# Patient Record
Sex: Female | Born: 1955 | Race: White | Hispanic: No | Marital: Married | Smoking: Former smoker
Health system: Southern US, Community
[De-identification: ages and names within clinical notes are randomized; demographics above are authoritative.]

## PROBLEM LIST (undated history)

## (undated) DIAGNOSIS — I509 Heart failure, unspecified: Secondary | ICD-10-CM

## (undated) DIAGNOSIS — M7500 Adhesive capsulitis of unspecified shoulder: Secondary | ICD-10-CM

## (undated) DIAGNOSIS — I472 Ventricular tachycardia, unspecified: Secondary | ICD-10-CM

## (undated) DIAGNOSIS — Z860101 Personal history of adenomatous and serrated colon polyps: Secondary | ICD-10-CM

## (undated) DIAGNOSIS — N289 Disorder of kidney and ureter, unspecified: Secondary | ICD-10-CM

## (undated) DIAGNOSIS — E559 Vitamin D deficiency, unspecified: Secondary | ICD-10-CM

## (undated) DIAGNOSIS — R7303 Prediabetes: Secondary | ICD-10-CM

## (undated) DIAGNOSIS — I82622 Acute embolism and thrombosis of deep veins of left upper extremity: Secondary | ICD-10-CM

## (undated) DIAGNOSIS — E785 Hyperlipidemia, unspecified: Secondary | ICD-10-CM

## (undated) DIAGNOSIS — I499 Cardiac arrhythmia, unspecified: Secondary | ICD-10-CM

## (undated) DIAGNOSIS — E039 Hypothyroidism, unspecified: Secondary | ICD-10-CM

## (undated) DIAGNOSIS — C801 Malignant (primary) neoplasm, unspecified: Secondary | ICD-10-CM

## (undated) DIAGNOSIS — M810 Age-related osteoporosis without current pathological fracture: Secondary | ICD-10-CM

## (undated) DIAGNOSIS — R002 Palpitations: Secondary | ICD-10-CM

## (undated) DIAGNOSIS — K635 Polyp of colon: Secondary | ICD-10-CM

## (undated) DIAGNOSIS — I420 Dilated cardiomyopathy: Secondary | ICD-10-CM

## (undated) DIAGNOSIS — K760 Fatty (change of) liver, not elsewhere classified: Secondary | ICD-10-CM

## (undated) DIAGNOSIS — E669 Obesity, unspecified: Secondary | ICD-10-CM

## (undated) DIAGNOSIS — T7840XA Allergy, unspecified, initial encounter: Secondary | ICD-10-CM

## (undated) HISTORY — PX: TRANSTHORACIC ECHOCARDIOGRAM: SHX275

## (undated) HISTORY — DX: Hyperlipidemia, unspecified: E78.5

## (undated) HISTORY — DX: Vitamin D deficiency, unspecified: E55.9

## (undated) HISTORY — DX: Allergy, unspecified, initial encounter: T78.40XA

## (undated) HISTORY — DX: Age-related osteoporosis without current pathological fracture: M81.0

## (undated) HISTORY — DX: Fatty (change of) liver, not elsewhere classified: K76.0

## (undated) HISTORY — DX: Disorder of kidney and ureter, unspecified: N28.9

## (undated) HISTORY — DX: Hypothyroidism, unspecified: E03.9

## (undated) HISTORY — DX: Dilated cardiomyopathy: I42.0

## (undated) HISTORY — PX: COLONOSCOPY: SHX174

## (undated) HISTORY — DX: Obesity, unspecified: E66.9

## (undated) HISTORY — PX: UPPER GASTROINTESTINAL ENDOSCOPY: SHX188

## (undated) HISTORY — DX: Polyp of colon: K63.5

## (undated) HISTORY — PX: LAPAROSCOPY: SHX197

## (undated) HISTORY — DX: Palpitations: R00.2

## (undated) HISTORY — DX: Personal history of adenomatous and serrated colon polyps: Z86.0101

## (undated) HISTORY — DX: Ventricular tachycardia, unspecified: I47.20

## (undated) HISTORY — DX: Acute embolism and thrombosis of deep veins of left upper extremity: I82.622

---
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
## 1995-01-08 HISTORY — PX: ABDOMINAL HYSTERECTOMY: SHX81

## 1997-09-02 ENCOUNTER — Other Ambulatory Visit: Admission: RE | Admit: 1997-09-02 | Discharge: 1997-09-02 | Payer: Self-pay | Admitting: Gynecology

## 1998-09-07 ENCOUNTER — Other Ambulatory Visit: Admission: RE | Admit: 1998-09-07 | Discharge: 1998-09-07 | Payer: Self-pay | Admitting: Gynecology

## 1999-09-11 ENCOUNTER — Other Ambulatory Visit: Admission: RE | Admit: 1999-09-11 | Discharge: 1999-09-11 | Payer: Self-pay | Admitting: Gynecology

## 2000-04-10 ENCOUNTER — Encounter: Admission: RE | Admit: 2000-04-10 | Discharge: 2000-04-10 | Payer: Self-pay | Admitting: Gynecology

## 2000-04-10 ENCOUNTER — Encounter: Payer: Self-pay | Admitting: Gynecology

## 2000-09-10 ENCOUNTER — Other Ambulatory Visit: Admission: RE | Admit: 2000-09-10 | Discharge: 2000-09-10 | Payer: Self-pay | Admitting: Gynecology

## 2001-10-02 ENCOUNTER — Other Ambulatory Visit: Admission: RE | Admit: 2001-10-02 | Discharge: 2001-10-02 | Payer: Self-pay | Admitting: Obstetrics and Gynecology

## 2001-11-04 ENCOUNTER — Encounter: Payer: Self-pay | Admitting: Obstetrics and Gynecology

## 2001-11-04 ENCOUNTER — Encounter: Admission: RE | Admit: 2001-11-04 | Discharge: 2001-11-04 | Payer: Self-pay | Admitting: Obstetrics and Gynecology

## 2003-09-27 ENCOUNTER — Other Ambulatory Visit: Admission: RE | Admit: 2003-09-27 | Discharge: 2003-09-27 | Payer: Self-pay | Admitting: Obstetrics and Gynecology

## 2003-10-04 ENCOUNTER — Ambulatory Visit (HOSPITAL_COMMUNITY): Admission: RE | Admit: 2003-10-04 | Discharge: 2003-10-04 | Payer: Self-pay | Admitting: Obstetrics and Gynecology

## 2004-08-01 ENCOUNTER — Encounter: Admission: RE | Admit: 2004-08-01 | Discharge: 2004-08-01 | Payer: Self-pay | Admitting: Family Medicine

## 2004-09-28 ENCOUNTER — Other Ambulatory Visit: Admission: RE | Admit: 2004-09-28 | Discharge: 2004-09-28 | Payer: Self-pay | Admitting: Obstetrics and Gynecology

## 2004-12-26 ENCOUNTER — Ambulatory Visit (HOSPITAL_COMMUNITY): Admission: RE | Admit: 2004-12-26 | Discharge: 2004-12-26 | Payer: Self-pay | Admitting: Obstetrics and Gynecology

## 2005-12-27 ENCOUNTER — Ambulatory Visit (HOSPITAL_COMMUNITY): Admission: RE | Admit: 2005-12-27 | Discharge: 2005-12-27 | Payer: Self-pay | Admitting: Emergency Medicine

## 2006-01-09 ENCOUNTER — Encounter: Admission: RE | Admit: 2006-01-09 | Discharge: 2006-01-09 | Payer: Self-pay | Admitting: Obstetrics and Gynecology

## 2006-08-18 ENCOUNTER — Encounter: Admission: RE | Admit: 2006-08-18 | Discharge: 2006-08-18 | Payer: Self-pay | Admitting: Obstetrics and Gynecology

## 2006-12-16 ENCOUNTER — Ambulatory Visit: Payer: Self-pay | Admitting: Internal Medicine

## 2006-12-29 ENCOUNTER — Ambulatory Visit: Payer: Self-pay | Admitting: Internal Medicine

## 2006-12-29 DIAGNOSIS — K635 Polyp of colon: Secondary | ICD-10-CM

## 2006-12-29 HISTORY — DX: Polyp of colon: K63.5

## 2006-12-29 HISTORY — PX: COLONOSCOPY W/ BIOPSIES: SHX1374

## 2006-12-30 ENCOUNTER — Encounter: Admission: RE | Admit: 2006-12-30 | Discharge: 2006-12-30 | Payer: Self-pay | Admitting: Obstetrics and Gynecology

## 2008-10-26 ENCOUNTER — Encounter: Admission: RE | Admit: 2008-10-26 | Discharge: 2008-10-26 | Payer: Self-pay | Admitting: Obstetrics and Gynecology

## 2009-12-08 ENCOUNTER — Encounter: Admission: RE | Admit: 2009-12-08 | Discharge: 2009-12-08 | Payer: Self-pay | Admitting: Obstetrics and Gynecology

## 2010-01-27 ENCOUNTER — Encounter: Payer: Self-pay | Admitting: Obstetrics and Gynecology

## 2010-12-25 ENCOUNTER — Other Ambulatory Visit: Payer: Self-pay | Admitting: Obstetrics and Gynecology

## 2010-12-25 DIAGNOSIS — Z1231 Encounter for screening mammogram for malignant neoplasm of breast: Secondary | ICD-10-CM

## 2011-01-08 LAB — HM MAMMOGRAPHY: HM Mammogram: NORMAL

## 2011-01-16 ENCOUNTER — Ambulatory Visit
Admission: RE | Admit: 2011-01-16 | Discharge: 2011-01-16 | Disposition: A | Discharge status: Home / Self Care | Payer: Self-pay | Source: Ambulatory Visit | Attending: Obstetrics and Gynecology | Admitting: Obstetrics and Gynecology

## 2011-01-16 DIAGNOSIS — Z1231 Encounter for screening mammogram for malignant neoplasm of breast: Secondary | ICD-10-CM

## 2011-07-26 LAB — FECAL OCCULT BLOOD, GUAIAC: Fecal Occult Blood: NEGATIVE

## 2011-09-18 ENCOUNTER — Other Ambulatory Visit (HOSPITAL_COMMUNITY): Payer: Self-pay | Admitting: Internal Medicine

## 2011-09-18 DIAGNOSIS — R1013 Epigastric pain: Secondary | ICD-10-CM

## 2011-09-18 DIAGNOSIS — R198 Other specified symptoms and signs involving the digestive system and abdomen: Secondary | ICD-10-CM

## 2011-09-23 ENCOUNTER — Ambulatory Visit (HOSPITAL_COMMUNITY)
Admission: RE | Admit: 2011-09-23 | Discharge: 2011-09-23 | Disposition: A | Discharge status: Home / Self Care | Payer: 59 | Source: Ambulatory Visit | Attending: Internal Medicine | Admitting: Internal Medicine

## 2011-09-23 DIAGNOSIS — R1013 Epigastric pain: Secondary | ICD-10-CM

## 2011-09-23 DIAGNOSIS — R198 Other specified symptoms and signs involving the digestive system and abdomen: Secondary | ICD-10-CM

## 2012-05-21 ENCOUNTER — Other Ambulatory Visit: Payer: Self-pay | Admitting: Nurse Practitioner

## 2012-05-26 ENCOUNTER — Other Ambulatory Visit: Payer: Self-pay

## 2012-05-26 DIAGNOSIS — Z1231 Encounter for screening mammogram for malignant neoplasm of breast: Secondary | ICD-10-CM

## 2012-06-10 ENCOUNTER — Other Ambulatory Visit (HOSPITAL_COMMUNITY): Payer: Self-pay | Admitting: Internal Medicine

## 2012-06-10 ENCOUNTER — Ambulatory Visit (HOSPITAL_COMMUNITY)
Admission: RE | Admit: 2012-06-10 | Discharge: 2012-06-10 | Disposition: A | Discharge status: Home / Self Care | Payer: 59 | Source: Ambulatory Visit | Attending: Internal Medicine | Admitting: Internal Medicine

## 2012-06-10 DIAGNOSIS — R059 Cough, unspecified: Secondary | ICD-10-CM | POA: Insufficient documentation

## 2012-06-10 DIAGNOSIS — R05 Cough: Secondary | ICD-10-CM | POA: Insufficient documentation

## 2012-06-23 LAB — FECAL OCCULT BLOOD, GUAIAC: Fecal Occult Blood: NEGATIVE

## 2012-07-03 ENCOUNTER — Other Ambulatory Visit (HOSPITAL_COMMUNITY): Payer: Self-pay | Admitting: Nurse Practitioner

## 2012-07-03 DIAGNOSIS — M810 Age-related osteoporosis without current pathological fracture: Secondary | ICD-10-CM

## 2012-07-23 ENCOUNTER — Other Ambulatory Visit: Payer: Self-pay | Admitting: *Deleted

## 2012-07-23 ENCOUNTER — Encounter: Payer: Self-pay | Admitting: Cardiovascular Disease

## 2012-07-23 ENCOUNTER — Ambulatory Visit (INDEPENDENT_AMBULATORY_CARE_PROVIDER_SITE_OTHER): Payer: 59 | Admitting: Cardiovascular Disease

## 2012-07-23 ENCOUNTER — Ambulatory Visit: Payer: 59

## 2012-07-23 ENCOUNTER — Ambulatory Visit
Admission: RE | Admit: 2012-07-23 | Discharge: 2012-07-23 | Disposition: A | Discharge status: Home / Self Care | Payer: 59 | Source: Ambulatory Visit

## 2012-07-23 ENCOUNTER — Ambulatory Visit (HOSPITAL_COMMUNITY)
Admission: RE | Admit: 2012-07-23 | Discharge: 2012-07-23 | Disposition: A | Discharge status: Home / Self Care | Payer: 59 | Source: Ambulatory Visit | Attending: Nurse Practitioner | Admitting: Nurse Practitioner

## 2012-07-23 VITALS — BP 126/82 | HR 75 | Ht 62.0 in | Wt 188.1 lb

## 2012-07-23 DIAGNOSIS — Z780 Asymptomatic menopausal state: Secondary | ICD-10-CM | POA: Insufficient documentation

## 2012-07-23 DIAGNOSIS — Z1231 Encounter for screening mammogram for malignant neoplasm of breast: Secondary | ICD-10-CM

## 2012-07-23 DIAGNOSIS — M810 Age-related osteoporosis without current pathological fracture: Secondary | ICD-10-CM

## 2012-07-23 DIAGNOSIS — Z13820 Encounter for screening for osteoporosis: Secondary | ICD-10-CM | POA: Insufficient documentation

## 2012-07-23 DIAGNOSIS — R002 Palpitations: Secondary | ICD-10-CM

## 2012-07-23 NOTE — Progress Notes (Signed)
     Natalie Baker Date of Birth  1955/05/03       [facility] Office [address], [address]  [address] [place], [place]  [address]   [place], [place]  [address] [id number]     [phone]   Fax  [phone]    Fax [phone]  Problem List: 1. Palpitations 2.   History of Present Illness:  Natalie Baker is a 57 yo who presents for further evaluation of palpitations.  Her mother has CHF and her father died of heart disease at age 68.  Palpitations occur at random times - usually during the day.  Last for few seconds - few minutes.  Not associated with any CP, dyspnea, or dizziness.   Hx of smoking.    She quit smoking in 1999.  She does not get any regular exercise.  Walks on occasion.  Works in her garden regularly.    She does not work.    No current outpatient prescriptions on file prior to visit.   No current facility-administered medications on file prior to visit.    No Known Allergies  No past medical history on file.  No past surgical history on file.  History  Smoking status  . Former Smoker  Smokeless tobacco  . Not on file    Comment: quit 1999 but is exposed to 2nd hand smoke    History  Alcohol Use: Not on file    No family history on file.  Reviw of Systems:  Reviewed in the HPI.  All other systems are negative.  Physical Exam: Blood pressure 126/82, pulse 75, height 5\' 2"  (1.575 m), weight 188 lb 1.9 oz (85.331 kg). General: Well developed, well nourished, in no acute distress.  Head: Normocephalic, atraumatic, sclera non-icteric, mucus membranes are moist,   Neck: Supple. Carotids are 2 + without bruits. No JVD   Lungs: Clear   Heart: RR, normal S1, S2. No murmur  Abdomen: Soft, non-tender, non-distended with normal bowel sounds.  Msk:  Strength and tone are normal   Extremities: No clubbing or cyanosis. No edema.  Distal pedal pulses are 2+ and equal    Neuro: CN II - XII intact.  Alert  and oriented X 3.   Psych:  Normal   ECG: July 23, 2012:  NSR at 68 with sinus arrhythmia. Low voltage QRS.     Assessment / Plan:

## 2012-07-23 NOTE — Assessment & Plan Note (Signed)
Natalie Baker presents for further evaluation of her palpitations.  These are likely due to PVCs.    i have recommended that she work on an exercise, better diet and weight loss program.  If she is able to advance in her exercise regimin and not have any chest pain then I would be reassured that her heart is fine.    i will see her on as needed basis.  She will call if she has any problems.

## 2012-07-23 NOTE — Patient Instructions (Addendum)
Your physician recommends that you schedule a follow-up appointment in: as needed basis   Your physician recommends that you continue on your current medications as directed. Please refer to the Current Medication list given to you today.   

## 2012-11-20 LAB — HM PAP SMEAR

## 2012-12-16 ENCOUNTER — Encounter: Payer: Self-pay | Admitting: Internal Medicine

## 2012-12-16 DIAGNOSIS — E559 Vitamin D deficiency, unspecified: Secondary | ICD-10-CM | POA: Insufficient documentation

## 2012-12-16 DIAGNOSIS — E785 Hyperlipidemia, unspecified: Secondary | ICD-10-CM | POA: Insufficient documentation

## 2012-12-17 ENCOUNTER — Ambulatory Visit: Payer: 59 | Admitting: Emergency Medicine

## 2012-12-25 ENCOUNTER — Ambulatory Visit: Payer: Self-pay | Admitting: Internal Medicine

## 2012-12-25 DIAGNOSIS — E039 Hypothyroidism, unspecified: Secondary | ICD-10-CM | POA: Insufficient documentation

## 2013-04-08 ENCOUNTER — Telehealth: Payer: Self-pay | Admitting: *Deleted

## 2013-04-08 ENCOUNTER — Other Ambulatory Visit: Payer: Self-pay | Admitting: Emergency Medicine

## 2013-04-08 MED ORDER — PENCICLOVIR 1 % EX CREA: 1.0000 "application " | TOPICAL_CREAM | CUTANEOUS | Status: DC

## 2013-04-08 NOTE — Telephone Encounter (Signed)
REFILL -   NORMALLY REFILLED BY HER OLD OBGYN BUT ASKING CAN WE CALL THIS IN?   DENAVIR 1% CREAM NOV

## 2013-04-15 ENCOUNTER — Other Ambulatory Visit: Payer: Self-pay | Admitting: Internal Medicine

## 2013-04-23 ENCOUNTER — Other Ambulatory Visit: Payer: Self-pay | Admitting: Internal Medicine

## 2013-06-15 ENCOUNTER — Ambulatory Visit (INDEPENDENT_AMBULATORY_CARE_PROVIDER_SITE_OTHER): Payer: 59 | Admitting: Emergency Medicine

## 2013-06-15 ENCOUNTER — Encounter: Payer: Self-pay | Admitting: Emergency Medicine

## 2013-06-15 ENCOUNTER — Telehealth: Payer: Self-pay | Admitting: Internal Medicine

## 2013-06-15 VITALS — BP 140/70 | HR 76 | Temp 98.4°F | Resp 18 | Ht 62.5 in | Wt 190.0 lb

## 2013-06-15 DIAGNOSIS — Z0000 Encounter for general adult medical examination without abnormal findings: Secondary | ICD-10-CM

## 2013-06-15 DIAGNOSIS — R200 Anesthesia of skin: Secondary | ICD-10-CM

## 2013-06-15 DIAGNOSIS — R030 Elevated blood-pressure reading, without diagnosis of hypertension: Secondary | ICD-10-CM

## 2013-06-15 DIAGNOSIS — E782 Mixed hyperlipidemia: Secondary | ICD-10-CM

## 2013-06-15 DIAGNOSIS — J309 Allergic rhinitis, unspecified: Secondary | ICD-10-CM

## 2013-06-15 DIAGNOSIS — L853 Xerosis cutis: Secondary | ICD-10-CM

## 2013-06-15 DIAGNOSIS — M2550 Pain in unspecified joint: Secondary | ICD-10-CM

## 2013-06-15 LAB — CBC WITH DIFFERENTIAL/PLATELET
BASOS PCT: 0 % (ref 0–1)
Basophils Absolute: 0 10*3/uL (ref 0.0–0.1)
EOS ABS: 0.1 10*3/uL (ref 0.0–0.7)
Eosinophils Relative: 1 % (ref 0–5)
HCT: 43.5 % (ref 36.0–46.0)
Hemoglobin: 14.5 g/dL (ref 12.0–15.0)
Lymphocytes Relative: 30 % (ref 12–46)
Lymphs Abs: 1.9 10*3/uL (ref 0.7–4.0)
MCH: 27.2 pg (ref 26.0–34.0)
MCHC: 33.3 g/dL (ref 30.0–36.0)
MCV: 81.6 fL (ref 78.0–100.0)
Monocytes Absolute: 0.4 10*3/uL (ref 0.1–1.0)
Monocytes Relative: 6 % (ref 3–12)
NEUTROS PCT: 63 % (ref 43–77)
Neutro Abs: 3.9 10*3/uL (ref 1.7–7.7)
PLATELETS: 303 10*3/uL (ref 150–400)
RBC: 5.33 MIL/uL — ABNORMAL HIGH (ref 3.87–5.11)
RDW: 15 % (ref 11.5–15.5)
WBC: 6.2 10*3/uL (ref 4.0–10.5)

## 2013-06-15 LAB — HEMOGLOBIN A1C
Hgb A1c MFr Bld: 5.8 % — ABNORMAL HIGH
Mean Plasma Glucose: 120 mg/dL — ABNORMAL HIGH

## 2013-06-15 NOTE — Patient Instructions (Signed)
De Quervain's Disease Harriet Pho disease is a condition often seen in racquet sports where there is a soreness (inflammation) in the cord like structures (tendons) which attach muscle to bone on the thumb side of the wrist. There may be a tightening of the tissuesaround the tendons. This condition is often helped by giving up or modifying the activity which caused it. When conservative treatment does not help, surgery may be required. Conservative treatment could include changes in the activity which brought about the problem or made it worse. Anti-inflammatory medications and injections may be used to help decrease the inflammation and help with pain control. Your caregiver will help you determine which is best for you. DIAGNOSIS  Often the diagnosis (learning what is wrong) can be made by examination. Sometimes x-rays are required. HOME CARE INSTRUCTIONS   Apply ice to the sore area for 15-20 minutes, 03-04 times per day while awake. Put the ice in a plastic bag and place a towel between the bag of ice and your skin. This is especially helpful if it can be done after all activities involving the sore wrist.  Temporary splinting may help.  Only take over-the-counter or prescription medicines for pain, discomfort or fever as directed by your caregiver. SEEK MEDICAL CARE IF:   Pain relief is not obtained with medications, or if you have increasing pain and seem to be getting worse rather than better. MAKE SURE YOU:   Understand these instructions.  Will watch your condition.  Will get help right away if you are not doing well or get worse. Document Released: 09/18/2000 Document Revised: 03/18/2011 Document Reviewed: 12/24/2004 [facility] Patient Information 2014 [place]. Degenerative Disk Disease Degenerative disk disease is a condition caused by the changes that occur in the cushions of the backbone (spinal disks) as you grow older. Spinal disks are soft and compressible disks  located between the bones of the spine (vertebrae). They act like shock absorbers. Degenerative disk disease can affect the whole spine. However, the neck and lower back are most commonly affected. Many changes can occur in the spinal disks with aging, such as:  The spinal disks may dry and shrink.  Small tears may occur in the tough, outer covering of the disk (annulus).  The disk space may become smaller due to loss of water.  Abnormal growths in the bone (spurs) may occur. This can put pressure on the nerve roots exiting the spinal canal, causing pain.  The spinal canal may become narrowed. CAUSES  Degenerative disk disease is a condition caused by the changes that occur in the spinal disks with aging. The exact cause is not known, but there is a genetic basis for many patients. Degenerative changes can occur due to loss of fluid in the disk. This makes the disk thinner and reduces the space between the backbones. Small cracks can develop in the outer layer of the disk. This can lead to the breakdown of the disk. You are more likely to get degenerative disk disease if you are overweight. Smoking cigarettes and doing heavy work such as weightlifting can also increase your risk of this condition. Degenerative changes can start after a sudden injury. Growth of bone spurs can compress the nerve roots and cause pain.  SYMPTOMS  The symptoms vary from person to person. Some people may have no pain, while others have severe pain. The pain may be so severe that it can limit your activities. The location of the pain depends on the part of your backbone  that is affected. You will have neck or arm pain if a disk in the neck area is affected. You will have pain in your back, buttocks, or legs if a disk in the lower back is affected. The pain becomes worse while bending, reaching up, or with twisting movements. The pain may start gradually and then get worse as time passes. It may also start after a major or  minor injury. You may feel numbness or tingling in the arms or legs.  DIAGNOSIS  Your caregiver will ask you about your symptoms and about activities or habits that may cause the pain. He or she may also ask about any injuries, diseases, or treatments you have had earlier. Your caregiver will examine you to check for the range of movement that is possible in the affected area, to check for strength in your extremities, and to check for sensation in the areas of the arms and legs supplied by different nerve roots. An X-ray of the spine may be taken. Your caregiver may suggest other imaging tests, such as magnetic resonance imaging (MRI), if needed.  TREATMENT  Treatment includes rest, modifying your activities, and applying ice and heat. Your caregiver may prescribe medicines to reduce your pain and may ask you to do some exercises to strengthen your back. In some cases, you may need surgery. You and your caregiver will decide on the treatment that is best for you. HOME CARE INSTRUCTIONS   Follow proper lifting and walking techniques as advised by your caregiver.  Maintain good posture.  Exercise regularly as advised.  Perform relaxation exercises.  Change your sitting, standing, and sleeping habits as advised. Change positions frequently.  Lose weight as advised.  Stop smoking if you smoke.  Wear supportive footwear. SEEK MEDICAL CARE IF:  Your pain does not go away within 1 to 4 weeks. SEEK IMMEDIATE MEDICAL CARE IF:   Your pain is severe.  You notice weakness in your arms, hands, or legs.  You begin to lose control of your bladder or bowel movements. MAKE SURE YOU:   Understand these instructions.  Will watch your condition.  Will get help right away if you are not doing well or get worse. Document Released: 10/21/2006 Document Revised: 03/18/2011 Document Reviewed: 10/21/2006 [facility] Patient Information 2014 [place].

## 2013-06-15 NOTE — Progress Notes (Signed)
Subjective:    Patient ID: Natalie Baker, female    DOB: [birth year], 58 y.o.   MRN: [id number]  HPI Comments: 58 yo female for CPE with numerous complaints. She has been noticing mild dizziness with change of position on/ off x several weeks. She notes allergy symptoms on/off. She has been having more stress with restarting work and trouble with roof at home.  She notes right middle finger with knot on finger x 1 year with pain if hits knot, otherwise does not bother her. She denies known injury.  She notes left hand pain in thumb into hand and now notes feels numb/ dulled x several weeks. 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 She denies any new strain.  SHe has had increased itchy/ dry/ skin/ peeling nails and hair falling out for several weeks.   She notes abdomen feels like spasming but notes BM are normal. She denies triggers. She notes oly lasts for short period of time then resolves.   She does have history of cholesterol that needs to be rechecked.      Hyperlipidemia     Medication List       This list is accurate as of: 06/15/13 11:59 PM.  Always use your most recent med list.               b complex vitamins tablet  Take 1 tablet by mouth daily.     calcium-vitamin D 250-125 MG-UNIT per tablet  Commonly known as:  OSCAL WITH D  Take 1 tablet by mouth daily.     COQ10 PO  Take 1 tablet by mouth 2 (two) times daily.     estradiol 1 MG tablet  Commonly known as:  ESTRACE  Take 0.5 mg by mouth daily.     FISH OIL PO  Take 2 capsules by mouth 2 (two) times daily.     FLAX SEED OIL PO  Take 2 capsules by mouth 2 (two) times daily.     IRON PO  Take 1 tablet by mouth daily.     levothyroxine 75 MCG tablet  Commonly known as:  SYNTHROID, LEVOTHROID  TAKE 1 TABLET BY MOUTH EVERY DAY... MAX DAYS SUPPLY ON INSURANCE IS 30 DAYS     MAGNESIUM PO  Take 1 tablet by mouth daily.     multivitamin capsule  Take 1 capsule by mouth  daily.     RED YEAST RICE PO  Take 2 tablets by mouth 2 (two) times daily.       Allergies  Allergen Reactions  . Ppd [Tuberculin Purified Protein Derivative]     Positive reaction.  Negative Chest Xray 06/16/12   Past Medical History  Diagnosis Date  . Hyperlipidemia   . Vitamin D deficiency    Past Surgical History  Procedure Laterality Date  . Abdominal hysterectomy  1997  . Cesarean section  1983   History  Substance Use Topics  . Smoking status: Former Smoker    Quit date: 01/08/1996  . Smokeless tobacco: Not on file     Comment: quit 1998 but is exposed to 2nd hand smoke  . Alcohol Use: Not on file   Family History  Problem Relation Age of Onset  . Heart disease Mother 49    chf  . Diabetes Mother   . Thyroid disease Mother   . Cancer Mother     female/ skin  . Hypertension Mother   . Cancer Maternal Aunt  breast  . Hyperlipidemia Maternal Aunt   . Heart disease Maternal Aunt   . Hypertension Father   . Alcohol abuse Father   . Hyperlipidemia Brother   . Hypertension Brother     MAINTENANCE: Colonoscopy: 2008 polyp Mammo:07/23/12 BMD:07/23/12 osteopenia Pap/ Pelvic:2013 pap/ manual 2014 EYE:2015 wnl Dentist:Q 3-6 month  IMMUNIZATIONS: Tdap:2008 Pneumovax:n/a Zostavax:n/a Influenza:declines  Patient Care Team: [name], MD as PCP - General (Internal Medicine) Gatha Mayer, MD as Consulting Physician (Gastroenterology) Logan Bores, MD as Consulting Physician (Obstetrics and Gynecology) Simona Huh, MD as Consulting Physician (Dermatology) Britta Mccreedy, (Dentist) [place], [facility])   Review of Systems  Gastrointestinal: Positive for abdominal pain.  Musculoskeletal: Positive for arthralgias and neck pain.  Neurological: Positive for light-headedness and numbness.  All other systems reviewed and are negative.  BP 140/70  Pulse 76  Temp(Src) 98.4 F (36.9 C) (Temporal)  Resp 18  Ht 5' 2.5" (1.588 m)  Wt 190 lb  (86.183 kg)  BMI 34.18 kg/m2     Objective:   Physical Exam  Nursing note and vitals reviewed. Constitutional: She is oriented to person, place, and time. She appears well-developed and well-nourished. No distress.  overweight  HENT:  Head: Normocephalic and atraumatic.  Right Ear: External ear normal.  Left Ear: External ear normal.  Nose: Nose normal.  Mouth/Throat: Oropharynx is clear and moist. No oropharyngeal exudate.  Cloudy TM's bilaterally   Eyes: Conjunctivae and EOM are normal. Pupils are equal, round, and reactive to light. Right eye exhibits no discharge. Left eye exhibits no discharge. No scleral icterus.  Neck: Normal range of motion. Neck supple. No JVD present. No tracheal deviation present. No thyromegaly present.  Cardiovascular: Normal rate, regular rhythm, normal heart sounds and intact distal pulses.   Pulmonary/Chest: Effort normal and breath sounds normal.  Abdominal: Soft. Bowel sounds are normal. She exhibits no distension and no mass. There is no tenderness. There is no rebound and no guarding.  Genitourinary:  Def gyn  Musculoskeletal: Normal range of motion. She exhibits tenderness. She exhibits no edema.  Right middle finger with 3-5 mm mildly firm nodule MIP area. Left thumb tender with extension   Lymphadenopathy:    She has no cervical adenopathy.  Neurological: She is alert and oriented to person, place, and time. She has normal reflexes. No cranial nerve deficit. She exhibits normal muscle tone. Coordination normal.  Skin: Skin is warm and dry. No rash noted. No erythema. No pallor.  Psychiatric: She has a normal mood and affect. Her behavior is normal. Judgment and thought content normal.      AORTA SCAN WNL EKG NSCSPT     Assessment & Plan:  1.CPE- Update screening labs/ History/ Immunizations/ Testing as needed. Advised healthy diet, QD exercise, increase H20 and continue RX/ Vitamins AD.  2. ? Left hand decreased sensation due to DDD  with neck vs de quervain's disease- Check labs if negative will need referral to Hand specialist vs ortho  3. ? Thyroid vs new autoimmune with multiple complaints (skin/ hair/ nails/ arthralgias/ BM)- check labs may need referral Rheumatology  4. Allergic rhinitis- Allegra OTC, increase H2o, allergy hygiene explained.   5. ? Abdomen pain- Bland diet ref GI and needs colonoscopy updated  6. 3 month F/U for elevated BP w/o HTN, Cholesterol, Pre-Dm, D. Deficient. Needs healthy diet, cardio QD and obtain healthy weight. Check Labs, Check BP if >130/80 call office

## 2013-06-16 LAB — IRON AND TIBC
%SAT: 23 % (ref 20–55)
IRON: 102 ug/dL (ref 42–145)
TIBC: 451 ug/dL (ref 250–470)
UIBC: 349 ug/dL (ref 125–400)

## 2013-06-16 LAB — ANTI-DNA ANTIBODY, DOUBLE-STRANDED: ds DNA Ab: <1 IU/mL

## 2013-06-16 LAB — BASIC METABOLIC PANEL WITH GFR
BUN: 13 mg/dL (ref 6–23)
CHLORIDE: 99 meq/L (ref 96–112)
CO2: 29 meq/L (ref 19–32)
Calcium: 9.9 mg/dL (ref 8.4–10.5)
Creat: 0.74 mg/dL (ref 0.50–1.10)
GFR, Est African American: >89 mL/min
GFR, Est Non African American: >89 mL/min
GLUCOSE: 78 mg/dL (ref 70–99)
POTASSIUM: 4.1 meq/L (ref 3.5–5.3)
Sodium: 139 mEq/L (ref 135–145)

## 2013-06-16 LAB — URINALYSIS, ROUTINE W REFLEX MICROSCOPIC
BILIRUBIN URINE: NEGATIVE
GLUCOSE, UA: NEGATIVE mg/dL
Hgb urine dipstick: NEGATIVE
Ketones, ur: NEGATIVE mg/dL
Leukocytes, UA: NEGATIVE
Nitrite: NEGATIVE
Protein, ur: NEGATIVE mg/dL
Specific Gravity, Urine: 1.006 (ref 1.005–1.030)
Urobilinogen, UA: 0.2 mg/dL (ref 0.0–1.0)
pH: 7.5 (ref 5.0–8.0)

## 2013-06-16 LAB — LIPID PANEL
CHOLESTEROL: 211 mg/dL — AB (ref 0–200)
HDL: 67 mg/dL (ref >39)
LDL Cholesterol: 107 mg/dL — ABNORMAL HIGH (ref 0–99)
Total CHOL/HDL Ratio: 3.1 Ratio
Triglycerides: 187 mg/dL — ABNORMAL HIGH (ref <150)
VLDL: 37 mg/dL (ref 0–40)

## 2013-06-16 LAB — MICROALBUMIN / CREATININE URINE RATIO
Creatinine, Urine: 20.5 mg/dL
MICROALB/CREAT RATIO: 24.4 mg/g (ref 0.0–30.0)
Microalb, Ur: 0.5 mg/dL (ref 0.00–1.89)

## 2013-06-16 LAB — HEPATIC FUNCTION PANEL
ALBUMIN: 4.5 g/dL (ref 3.5–5.2)
ALT: 16 U/L (ref 0–35)
AST: 16 U/L (ref 0–37)
Alkaline Phosphatase: 68 U/L (ref 39–117)
Bilirubin, Direct: 0.1 mg/dL (ref 0.0–0.3)
Indirect Bilirubin: 0.2 mg/dL (ref 0.2–1.2)
Total Bilirubin: 0.3 mg/dL (ref 0.2–1.2)
Total Protein: 6.9 g/dL (ref 6.0–8.3)

## 2013-06-16 LAB — FOLATE RBC: RBC Folate: 276 ng/mL — ABNORMAL LOW (ref >280)

## 2013-06-16 LAB — RHEUMATOID FACTOR: Rhuematoid fact SerPl-aCnc: <10 IU/mL (ref <=14)

## 2013-06-16 LAB — VITAMIN D 25 HYDROXY (VIT D DEFICIENCY, FRACTURES): Vit D, 25-Hydroxy: 47 ng/mL (ref 30–89)

## 2013-06-16 LAB — ANA: ANA: NEGATIVE

## 2013-06-16 LAB — INSULIN, FASTING: Insulin fasting, serum: 11 u[IU]/mL (ref 3–28)

## 2013-06-16 LAB — MAGNESIUM: Magnesium: 1.8 mg/dL (ref 1.5–2.5)

## 2013-06-16 LAB — TSH: TSH: 1.263 u[IU]/mL (ref 0.350–4.500)

## 2013-06-16 LAB — VITAMIN B12: VITAMIN B 12: 449 pg/mL (ref 211–911)

## 2013-06-16 LAB — SEDIMENTATION RATE: Sed Rate: 7 mm/hr (ref 0–22)

## 2013-06-16 LAB — CYCLIC CITRUL PEPTIDE ANTIBODY, IGG: Cyclic Citrullin Peptide Ab: <2 U/mL (ref 0.0–5.0)

## 2013-07-09 ENCOUNTER — Other Ambulatory Visit: Payer: Self-pay | Admitting: Emergency Medicine

## 2013-08-18 NOTE — Telephone Encounter (Signed)
No

## 2013-08-23 ENCOUNTER — Encounter: Payer: Self-pay | Admitting: Internal Medicine

## 2013-08-23 ENCOUNTER — Other Ambulatory Visit: Payer: 59

## 2013-08-23 ENCOUNTER — Ambulatory Visit (INDEPENDENT_AMBULATORY_CARE_PROVIDER_SITE_OTHER): Payer: 59 | Admitting: Internal Medicine

## 2013-08-23 VITALS — BP 126/70 | HR 72 | Ht 61.75 in | Wt 187.2 lb

## 2013-08-23 DIAGNOSIS — R197 Diarrhea, unspecified: Secondary | ICD-10-CM

## 2013-08-23 DIAGNOSIS — R1013 Epigastric pain: Secondary | ICD-10-CM

## 2013-08-23 MED ORDER — HYOSCYAMINE SULFATE 0.125 MG SL SUBL: 0.1250 mg | SUBLINGUAL_TABLET | SUBLINGUAL | Status: DC | PRN

## 2013-08-23 NOTE — Patient Instructions (Signed)
Your physician has requested that you go to the basement for the following lab work before leaving today: Stool studies  We have sent the following medications to your pharmacy for you to pick up at your convenience: Generic Levsin  Follow up with [place] in 2 months.  I appreciate the opportunity to care for you.

## 2013-08-23 NOTE — Progress Notes (Addendum)
Natalie Baker    [id number]    04-13-1955    Assessment and Plan/Recommendations:  1. Epigastric Pain: May be IBS in nature vs ulcer.  Will get H. Pylori stool antigen test and place pt on Levsin 0.125 mg sublingual prn. Will follow up in 2 months.  2. Diarrhea: Doesn't seem to be directly correlated with ABD pain per pt, but may be IBS in nature. Pt knows to watch for any aggravating foods. Try Levsin.  3. Colon cancer screening - hx of hyperplastic polyp in left colon so repeat 10 yrs from last colonoscopy as not a pre-cancerous polyp -. We have her in system to remind her around 12/2016.  HPI: Natalie Baker is a 58 y.o. white female complaining of upper abdominal pain with occasional nausea x approx 1 year.  She states 2-3 times a week she will have a "nagging discomfort" in her upper abdomen several hours after breakfast.  She also has occasional nausea without vomiting in the evenings, but not always on days she has abdominal pain.  She seldom has reflux symptoms for which she takes tums with relief.  She denies any life stressors or changes in medication.  She has 1.5 cups of coffee in the morning and occasional takes advil for general joint pains.  She denies alcohol consumption or smoking.  Her BMs are mostly regular with some episodes of diarrhea.  She thinks this may be aggravated by fruit and veggies, but cites no specific inciting factors.  The abdominal pain and bowel changes do not overlap.  She denies fever, chills, blood in her stool, dysphagia, or change in appetite.  She has occasional weight changes 2/2 hypothyroidism. She still has her GB.    Outpatient Encounter Prescriptions as of 08/23/2013  Medication Sig  . b complex vitamins tablet Take 1 tablet by mouth daily.  . calcium-vitamin D (OSCAL WITH D) 250-125 MG-UNIT per tablet Take 1 tablet by mouth daily.  . Coenzyme Q10 (COQ10 PO) Take 1 tablet by mouth 2 (two) times daily.  Marland Kitchen estradiol (ESTRACE) 1 MG  tablet Take 0.5 mg by mouth daily.  . Flaxseed, Linseed, (FLAX SEED OIL PO) Take 2 capsules by mouth 2 (two) times daily.  . IRON PO Take 1 tablet by mouth daily.  Marland Kitchen levothyroxine (SYNTHROID, LEVOTHROID) 75 MCG tablet TAKE 1 TABLET BY MOUTH EVERY DAY... MAX DAYS SUPPLY ON INSURANCE IS 30 DAYS  . MAGNESIUM PO Take 1 tablet by mouth daily.  . Multiple Vitamin (MULTIVITAMIN) capsule Take 1 capsule by mouth daily.  . Omega-3 Fatty Acids (FISH OIL PO) Take 2 capsules by mouth 2 (two) times daily.  . Red Yeast Rice Extract (RED YEAST RICE PO) Take 2 tablets by mouth 2 (two) times daily.  . hyoscyamine (LEVSIN SL) 0.125 MG SL tablet Place 1 tablet (0.125 mg total) under the tongue every 4 (four) hours as needed for cramping (diarrhea).  . [DISCONTINUED] SYNTHROID 75 MCG tablet TAKE 1 TABLET BY MOUTH EVERY DAY... MAX DAYS SUPPLY ON INSURANCE IS 30 DAYS    Allergies as of 08/23/2013 - Review Complete 08/23/2013  Allergen Reaction Noted  . Ppd [tuberculin purified protein derivative]  12/16/2012    Past Medical History  Diagnosis Date  . Hyperlipidemia   . Vitamin D deficiency   . Osteoporosis   . Hypothyroidism   . Palpitations   . Colon polyp 12/29/2006    Past Surgical History  Procedure Laterality Date  . Abdominal hysterectomy  1997  .  Cesarean section  1983  . Colonoscopy w/ biopsies  12/29/2006    Review of systems: Positive for: Abdominal pain, Nausea, diarrhea All other ROS negative or as per HPI  Physical Exam: BP 126/70  Pulse 72  Ht 5' 1.75" (1.568 m)  Wt 187 lb 4 oz (84.936 kg)  BMI 34.55 kg/m2 Constitutional: WDWN NAD Eyes: anicteric Lungs: clear to auscultation bilaterally Cardiovascular: S1S2 with regular rate and rhythm, no rubs murmurs or gallops Abdomen: soft, nontender, nondistended, no masses or organomegaly, normal bowel sounds Extremities: no lower extremity edema  Skin: no rash Neuro: alert and oriented x 3 Psych: normal mood and affect  Data  Reviewed: Previous pt records and colonoscopy  Loui Massenburg A. [place], [place] 08/23/2013 9:48 AM    I have personally seen the patient, reviewed and repeated key elements of the history and physical and participated in formation of the assessment and plan the student has documented.   I appreciate the opportunity to care for this patient.  Gatha Mayer, MD, [facility]  ET:[id number] DAVID, MD

## 2013-08-24 ENCOUNTER — Encounter: Payer: Self-pay | Admitting: Internal Medicine

## 2013-09-02 ENCOUNTER — Other Ambulatory Visit: Payer: Self-pay | Admitting: Physician Assistant

## 2013-09-02 MED ORDER — LEVOTHYROXINE SODIUM 75 MCG PO TABS: ORAL_TABLET | ORAL | Status: DC

## 2013-09-17 ENCOUNTER — Ambulatory Visit: Payer: Self-pay | Admitting: Internal Medicine

## 2013-09-17 ENCOUNTER — Ambulatory Visit: Payer: Self-pay | Admitting: Emergency Medicine

## 2013-10-11 ENCOUNTER — Encounter: Payer: Self-pay | Admitting: Physician Assistant

## 2013-10-11 ENCOUNTER — Ambulatory Visit (INDEPENDENT_AMBULATORY_CARE_PROVIDER_SITE_OTHER): Payer: 59 | Admitting: Physician Assistant

## 2013-10-11 VITALS — BP 120/70 | HR 76 | Temp 98.1°F | Resp 16 | Ht 62.5 in | Wt 190.0 lb

## 2013-10-11 DIAGNOSIS — E669 Obesity, unspecified: Secondary | ICD-10-CM

## 2013-10-11 DIAGNOSIS — Z79899 Other long term (current) drug therapy: Secondary | ICD-10-CM

## 2013-10-11 DIAGNOSIS — E559 Vitamin D deficiency, unspecified: Secondary | ICD-10-CM

## 2013-10-11 DIAGNOSIS — R7309 Other abnormal glucose: Secondary | ICD-10-CM

## 2013-10-11 DIAGNOSIS — E785 Hyperlipidemia, unspecified: Secondary | ICD-10-CM

## 2013-10-11 DIAGNOSIS — E038 Other specified hypothyroidism: Secondary | ICD-10-CM

## 2013-10-11 DIAGNOSIS — R05 Cough: Secondary | ICD-10-CM

## 2013-10-11 DIAGNOSIS — R059 Cough, unspecified: Secondary | ICD-10-CM

## 2013-10-11 DIAGNOSIS — M810 Age-related osteoporosis without current pathological fracture: Secondary | ICD-10-CM

## 2013-10-11 DIAGNOSIS — M79643 Pain in unspecified hand: Secondary | ICD-10-CM

## 2013-10-11 DIAGNOSIS — R7303 Prediabetes: Secondary | ICD-10-CM

## 2013-10-11 LAB — CBC WITH DIFFERENTIAL/PLATELET
Basophils Absolute: 0.1 10*3/uL (ref 0.0–0.1)
Basophils Relative: 1 % (ref 0–1)
EOS ABS: 0.1 10*3/uL (ref 0.0–0.7)
Eosinophils Relative: 1 % (ref 0–5)
HEMATOCRIT: 42.1 % (ref 36.0–46.0)
HEMOGLOBIN: 13.8 g/dL (ref 12.0–15.0)
Lymphocytes Relative: 33 % (ref 12–46)
Lymphs Abs: 1.9 10*3/uL (ref 0.7–4.0)
MCH: 27.2 pg (ref 26.0–34.0)
MCHC: 32.8 g/dL (ref 30.0–36.0)
MCV: 82.9 fL (ref 78.0–100.0)
MONO ABS: 0.5 10*3/uL (ref 0.1–1.0)
MONOS PCT: 8 % (ref 3–12)
NEUTROS ABS: 3.3 10*3/uL (ref 1.7–7.7)
Neutrophils Relative %: 57 % (ref 43–77)
Platelets: 303 10*3/uL (ref 150–400)
RBC: 5.08 MIL/uL (ref 3.87–5.11)
RDW: 14 % (ref 11.5–15.5)
WBC: 5.8 10*3/uL (ref 4.0–10.5)

## 2013-10-11 NOTE — Progress Notes (Signed)
Assessment and Plan:  Hypertension: Continue medication, monitor blood pressure at home. Continue DASH diet. Cholesterol: Continue diet and exercise. Check cholesterol.  Pre-diabetes-Continue diet and exercise. Check A1C Vitamin D Def- check level and continue medications.  Hypothyroidism-check TSH level, continue medications the same.  Obesity with co morbidities- long discussion about weight loss, diet, and exercise Cough: Possible nocturnal GERD- Please increase your omeprazole to 20mg  twice a day  Hand pain- ortho referral   Continue diet and meds as discussed. Further disposition pending results of labs.  HPI 58 y.o. female  presents for 3 month follow up with hypertension, hyperlipidemia, prediabetes and vitamin D. Her blood pressure has been controlled at home, today their BP is BP: 120/70 mmHg She does not workout. She denies chest pain, shortness of breath, dizziness.  She is not on cholesterol medication and denies myalgias. Her cholesterol is not at goal. The cholesterol last visit was:   Lab Results  Component Value Date   CHOL 211* 06/15/2013   HDL 67 06/15/2013   LDLCALC 107* 06/15/2013   TRIG 187* 06/15/2013   CHOLHDL 3.1 06/15/2013   She has been working on diet and exercise for prediabetes, and denies paresthesia of the feet, polydipsia, polyuria and visual disturbances. Last A1C in the office was:  Lab Results  Component Value Date   HGBA1C 5.8* 06/15/2013   Patient is on Vitamin D supplement.   Lab Results  Component Value Date   VD25OH 47 06/15/2013     She is on thyroid medication. Her medication was not changed last visit. Patient denies nervousness, palpitations and weight changes.  Lab Results  Component Value Date   TSH 1.263 06/15/2013  .  She has a dry cough at night without wheezing, SOB, CP. She denies sinus symptoms and was recently put on levsin for nausea which helps.  Still complaining of bilateral hand pain, negative autoimmune work up, likely OA- will send  to ortho for injections.   Current Medications:  Current Outpatient Prescriptions on File Prior to Visit  Medication Sig Dispense Refill  . b complex vitamins tablet Take 1 tablet by mouth daily.      . calcium-vitamin D (OSCAL WITH D) 250-125 MG-UNIT per tablet Take 1 tablet by mouth daily.      . Coenzyme Q10 (COQ10 PO) Take 1 tablet by mouth 2 (two) times daily.      Marland Kitchen estradiol (ESTRACE) 1 MG tablet Take 0.5 mg by mouth daily.      . Flaxseed, Linseed, (FLAX SEED OIL PO) Take 2 capsules by mouth 2 (two) times daily.      . hyoscyamine (LEVSIN SL) 0.125 MG SL tablet Place 1 tablet (0.125 mg total) under the tongue every 4 (four) hours as needed for cramping (diarrhea).  60 tablet  1  . IRON PO Take 1 tablet by mouth daily.      Marland Kitchen levothyroxine (SYNTHROID, LEVOTHROID) 75 MCG tablet TAKE 1 TABLET BY MOUTH EVERY DAY...  30 tablet  3  . MAGNESIUM PO Take 1 tablet by mouth daily.      . Multiple Vitamin (MULTIVITAMIN) capsule Take 1 capsule by mouth daily.      . Omega-3 Fatty Acids (FISH OIL PO) Take 2 capsules by mouth 2 (two) times daily.      . Red Yeast Rice Extract (RED YEAST RICE PO) Take 2 tablets by mouth 2 (two) times daily.       No current facility-administered medications on file prior to visit.  Medical History:  Past Medical History  Diagnosis Date  . Hyperlipidemia   . Vitamin D deficiency   . Osteoporosis   . Hypothyroidism   . Palpitations   . Colon polyp 12/29/2006  . Obesity     BMI 34   Allergies:  Allergies  Allergen Reactions  . Ppd [Tuberculin Purified Protein Derivative]     Positive reaction.  Negative Chest Xray 06/16/12     Review of Systems: [X]  = complains of  [ ]  = denies  General: Fatigue [ ]  Fever [ ]  Chills [ ]  Weakness [ ]   Insomnia [ ]  Eyes: Redness [ ]  Blurred vision [ ]  Diplopia [ ]   ENT: Congestion [ ]  Sinus Pain [ ]  Post Nasal Drip [ ]  Sore Throat [ ]  Earache [ ]   Cardiac: Chest pain/pressure [ ]  SOB [ ]  Orthopnea [ ]   Palpitations [ ]    Paroxysmal nocturnal dyspnea[ ]  Claudication [ ]  Edema [ ]   Pulmonary: Cough at night only [x ] Wheezing[ ]   SOB [ ]   Snoring [ ]   GI: Nausea [ ]  Vomiting[ ]  Dysphagia[ ]  Heartburn[ ]  Abdominal pain [ ]  Constipation [ ] ; Diarrhea [ ] ; BRBPR [ ]  Melena[ ]  GU: Hematuria[ ]  Dysuria [ ]  Nocturia[ ]  Urgency [ ]   Hesitancy [ ]  Discharge [ ]  Neuro: Headaches[ ]  Vertigo[ ]  Paresthesias[ ]  Spasm [ ]  Speech changes [ ]  Incoordination [ ]   Ortho: Arthritis [x ] Joint pain hands [x ] Muscle pain [ ]  Joint swelling [ ]  Back Pain [ ]  Skin:  Rash [ ]   Pruritis [ ]  Change in skin lesion [ ]   Psych: Depression[ ]  Anxiety[ ]  Confusion [ ]  Memory loss [ ]   Heme/Lypmh: Bleeding [ ]  Bruising [ ]  Enlarged lymph nodes [ ]   Endocrine: Visual blurring [ ]  Paresthesia [ ]  Polyuria [ ]  Polydypsea [ ]    Heat/cold intolerance [ ]  Hypoglycemia [ ]   Family history- Review and unchanged Social history- Review and unchanged Physical Exam: BP 120/70  Pulse 76  Temp(Src) 98.1 F (36.7 C)  Resp 16  Ht 5' 2.5" (1.588 m)  Wt 190 lb (86.183 kg)  BMI 34.18 kg/m2 Wt Readings from Last 3 Encounters:  10/11/13 190 lb (86.183 kg)  08/23/13 187 lb 4 oz (84.936 kg)  06/15/13 190 lb (86.183 kg)   General Appearance: Well nourished, in no apparent distress. Eyes: PERRLA, EOMs, conjunctiva no swelling or erythema Sinuses: No Frontal/maxillary tenderness ENT/Mouth: Ext aud canals clear, TMs without erythema, bulging. No erythema, swelling, or exudate on post pharynx.  Tonsils not swollen or erythematous. Hearing normal.  Neck: Supple, thyroid normal.  Respiratory: Respiratory effort normal, BS equal bilaterally without rales, rhonchi, wheezing or stridor.  Cardio: RRR with no MRGs. Brisk peripheral pulses without edema.  Abdomen: Soft, + BS.  Non tender, no guarding, rebound, hernias, masses. Lymphatics: Non tender without lymphadenopathy.  Musculoskeletal: Full ROM, 5/5 strength, normal gait.  ? Mucoid cyst versus OA on DIP  right 3rd finger, pain at left 1st MCP negative finkelstein test Skin: Warm, dry without rashes, lesions, ecchymosis.  Neuro: Cranial nerves intact. Normal muscle tone, no cerebellar symptoms. Sensation intact.  Psych: Awake and oriented X 3, normal affect, Insight and Judgment appropriate.    Vicie Mutters 9:38 AM

## 2013-10-11 NOTE — Patient Instructions (Signed)
   Bad carbs also include fruit juice, alcohol, and sweet tea. These are empty calories that do not signal to your brain that you are full.   Please remember the good carbs are still carbs which convert into sugar. So please measure them out no more than 1/2-1 cup of rice, oatmeal, pasta, and beans.  Veggies are however free foods! Pile them on.   I like lean protein at every meal such as chicken, [place], pork chops, cottage cheese, etc. Just do not fry these meats and please center your meal around vegetable, the meats should be a side dish.   No all fruit is created equal. Please see the list below, the fruit at the bottom is higher in sugars than the fruit at the top   Take the nexium/protonix for 2-4 more weeks once a day, then switch to pepcid or zantac (generic is fine) 2 x a day for 2 weeks, then once a day for 2 weeks and then stop. Avoid alcohol, spicy foods, NSAIDS (aleve, ibuprofen) at this time. See foods below.   Food Choices for Gastroesophageal Reflux Disease When you have gastroesophageal reflux disease (GERD), the foods you eat and your eating habits are very important. Choosing the right foods can help ease the discomfort of GERD. WHAT GENERAL GUIDELINES DO I NEED TO FOLLOW?  Choose fruits, vegetables, whole grains, low-fat dairy products, and low-fat meat, fish, and poultry.  Limit fats such as oils, salad dressings, butter, nuts, and avocado.  Keep a food diary to identify foods that cause symptoms.  Avoid foods that cause reflux. These may be different for different people.  Eat frequent small meals instead of three large meals each day.  Eat your meals slowly, in a relaxed setting.  Limit fried foods.  Cook foods using methods other than frying.  Avoid drinking alcohol.  Avoid drinking large amounts of liquids with your meals.  Avoid bending over or lying down until 2-3 hours after eating. WHAT FOODS ARE NOT RECOMMENDED? The following are some foods and  drinks that may worsen your symptoms: Vegetables Tomatoes. Tomato juice. Tomato and spaghetti sauce. Chili peppers. Onion and garlic. Horseradish. Fruits Oranges, grapefruit, and lemon (fruit and juice). Meats High-fat meats, fish, and poultry. This includes hot dogs, ribs, ham, sausage, salami, and bacon. Dairy Whole milk and chocolate milk. Sour cream. Cream. Butter. Ice cream. Cream cheese.  Beverages Coffee and tea, with or without caffeine. Carbonated beverages or energy drinks. Condiments Hot sauce. Barbecue sauce.  Sweets/Desserts Chocolate and cocoa. Donuts. Peppermint and spearmint. Fats and Oils High-fat foods, including [place] fries and potato chips. Other Vinegar. Strong spices, such as black pepper, white pepper, red pepper, cayenne, curry powder, cloves, ginger, and chili powder.

## 2013-10-12 LAB — LIPID PANEL
Cholesterol: 215 mg/dL — ABNORMAL HIGH (ref 0–200)
HDL: 55 mg/dL (ref >39)
LDL Cholesterol: 126 mg/dL — ABNORMAL HIGH (ref 0–99)
TRIGLYCERIDES: 168 mg/dL — AB (ref <150)
Total CHOL/HDL Ratio: 3.9 Ratio
VLDL: 34 mg/dL (ref 0–40)

## 2013-10-12 LAB — TSH: TSH: 2.087 u[IU]/mL (ref 0.350–4.500)

## 2013-10-12 LAB — HEPATIC FUNCTION PANEL
ALT: 16 U/L (ref 0–35)
AST: 18 U/L (ref 0–37)
Albumin: 4.3 g/dL (ref 3.5–5.2)
Alkaline Phosphatase: 72 U/L (ref 39–117)
BILIRUBIN INDIRECT: 0.2 mg/dL (ref 0.2–1.2)
Bilirubin, Direct: 0.1 mg/dL (ref 0.0–0.3)
Total Bilirubin: 0.3 mg/dL (ref 0.2–1.2)
Total Protein: 6.9 g/dL (ref 6.0–8.3)

## 2013-10-12 LAB — BASIC METABOLIC PANEL WITH GFR
BUN: 11 mg/dL (ref 6–23)
CO2: 29 meq/L (ref 19–32)
Calcium: 9.6 mg/dL (ref 8.4–10.5)
Chloride: 102 mEq/L (ref 96–112)
Creat: 0.73 mg/dL (ref 0.50–1.10)
GFR, Est African American: >89 mL/min
GFR, Est Non African American: >89 mL/min
GLUCOSE: 85 mg/dL (ref 70–99)
POTASSIUM: 4.4 meq/L (ref 3.5–5.3)
Sodium: 140 mEq/L (ref 135–145)

## 2013-10-12 LAB — HEMOGLOBIN A1C
Hgb A1c MFr Bld: 6.1 % — ABNORMAL HIGH (ref <5.7)
MEAN PLASMA GLUCOSE: 128 mg/dL — AB (ref <117)

## 2013-10-12 LAB — MAGNESIUM: Magnesium: 1.8 mg/dL (ref 1.5–2.5)

## 2013-10-12 LAB — VITAMIN D 25 HYDROXY (VIT D DEFICIENCY, FRACTURES): Vit D, 25-Hydroxy: 37 ng/mL (ref 30–89)

## 2013-10-19 ENCOUNTER — Ambulatory Visit: Payer: Self-pay | Admitting: Emergency Medicine

## 2014-01-17 ENCOUNTER — Other Ambulatory Visit: Payer: Self-pay | Admitting: Physician Assistant

## 2014-05-11 ENCOUNTER — Other Ambulatory Visit: Payer: Self-pay | Admitting: Physician Assistant

## 2014-06-19 ENCOUNTER — Other Ambulatory Visit: Payer: Self-pay | Admitting: Internal Medicine

## 2014-06-21 ENCOUNTER — Ambulatory Visit (INDEPENDENT_AMBULATORY_CARE_PROVIDER_SITE_OTHER): Payer: 59 | Admitting: Internal Medicine

## 2014-06-21 ENCOUNTER — Encounter: Payer: Self-pay | Admitting: Internal Medicine

## 2014-06-21 ENCOUNTER — Encounter: Payer: Self-pay | Admitting: Emergency Medicine

## 2014-06-21 VITALS — BP 138/84 | HR 72 | Temp 98.0°F | Resp 18 | Ht 62.25 in | Wt 196.0 lb

## 2014-06-21 DIAGNOSIS — R002 Palpitations: Secondary | ICD-10-CM

## 2014-06-21 DIAGNOSIS — E559 Vitamin D deficiency, unspecified: Secondary | ICD-10-CM

## 2014-06-21 DIAGNOSIS — R7303 Prediabetes: Secondary | ICD-10-CM

## 2014-06-21 DIAGNOSIS — R7309 Other abnormal glucose: Secondary | ICD-10-CM

## 2014-06-21 DIAGNOSIS — E038 Other specified hypothyroidism: Secondary | ICD-10-CM

## 2014-06-21 DIAGNOSIS — Z79899 Other long term (current) drug therapy: Secondary | ICD-10-CM

## 2014-06-21 DIAGNOSIS — M810 Age-related osteoporosis without current pathological fracture: Secondary | ICD-10-CM

## 2014-06-21 DIAGNOSIS — R030 Elevated blood-pressure reading, without diagnosis of hypertension: Secondary | ICD-10-CM

## 2014-06-21 DIAGNOSIS — E669 Obesity, unspecified: Secondary | ICD-10-CM

## 2014-06-21 DIAGNOSIS — IMO0001 Reserved for inherently not codable concepts without codable children: Secondary | ICD-10-CM

## 2014-06-21 DIAGNOSIS — E785 Hyperlipidemia, unspecified: Secondary | ICD-10-CM

## 2014-06-21 DIAGNOSIS — Z1212 Encounter for screening for malignant neoplasm of rectum: Secondary | ICD-10-CM

## 2014-06-21 DIAGNOSIS — R5383 Other fatigue: Secondary | ICD-10-CM

## 2014-06-21 LAB — HEPATIC FUNCTION PANEL
ALBUMIN: 4.4 g/dL (ref 3.5–5.2)
ALT: 19 U/L (ref 0–35)
AST: 19 U/L (ref 0–37)
Alkaline Phosphatase: 69 U/L (ref 39–117)
Bilirubin, Direct: <0.1 mg/dL (ref 0.0–0.3)
Total Bilirubin: 0.3 mg/dL (ref 0.2–1.2)
Total Protein: 7 g/dL (ref 6.0–8.3)

## 2014-06-21 LAB — LIPID PANEL
Cholesterol: 236 mg/dL — ABNORMAL HIGH (ref 0–200)
HDL: 61 mg/dL (ref >=46)
LDL CALC: 146 mg/dL — AB (ref 0–99)
TRIGLYCERIDES: 147 mg/dL (ref <150)
Total CHOL/HDL Ratio: 3.9 Ratio
VLDL: 29 mg/dL (ref 0–40)

## 2014-06-21 LAB — TSH: TSH: 5.423 u[IU]/mL — AB (ref 0.350–4.500)

## 2014-06-21 LAB — BASIC METABOLIC PANEL WITH GFR
BUN: 12 mg/dL (ref 6–23)
CO2: 31 mEq/L (ref 19–32)
Calcium: 10 mg/dL (ref 8.4–10.5)
Chloride: 101 mEq/L (ref 96–112)
Creat: 0.75 mg/dL (ref 0.50–1.10)
GFR, Est African American: >89 mL/min
GFR, Est Non African American: 88 mL/min
Glucose, Bld: 87 mg/dL (ref 70–99)
Potassium: 4.5 mEq/L (ref 3.5–5.3)
SODIUM: 139 meq/L (ref 135–145)

## 2014-06-21 LAB — CBC WITH DIFFERENTIAL/PLATELET
Basophils Absolute: 0.1 10*3/uL (ref 0.0–0.1)
Basophils Relative: 1 % (ref 0–1)
EOS ABS: 0.1 10*3/uL (ref 0.0–0.7)
EOS PCT: 2 % (ref 0–5)
HEMATOCRIT: 43.5 % (ref 36.0–46.0)
HEMOGLOBIN: 14 g/dL (ref 12.0–15.0)
Lymphocytes Relative: 38 % (ref 12–46)
Lymphs Abs: 2.5 10*3/uL (ref 0.7–4.0)
MCH: 25.9 pg — ABNORMAL LOW (ref 26.0–34.0)
MCHC: 32.2 g/dL (ref 30.0–36.0)
MCV: 80.6 fL (ref 78.0–100.0)
MONO ABS: 0.5 10*3/uL (ref 0.1–1.0)
MONOS PCT: 8 % (ref 3–12)
MPV: 10.1 fL (ref 8.6–12.4)
Neutro Abs: 3.3 10*3/uL (ref 1.7–7.7)
Neutrophils Relative %: 51 % (ref 43–77)
Platelets: 316 10*3/uL (ref 150–400)
RBC: 5.4 MIL/uL — AB (ref 3.87–5.11)
RDW: 16.1 % — AB (ref 11.5–15.5)
WBC: 6.5 10*3/uL (ref 4.0–10.5)

## 2014-06-21 LAB — VITAMIN B12: Vitamin B-12: 598 pg/mL (ref 211–911)

## 2014-06-21 LAB — IRON AND TIBC
%SAT: 16 % — ABNORMAL LOW (ref 20–55)
IRON: 72 ug/dL (ref 42–145)
TIBC: 464 ug/dL (ref 250–470)
UIBC: 392 ug/dL (ref 125–400)

## 2014-06-21 LAB — HEMOGLOBIN A1C
HEMOGLOBIN A1C: 5.9 % — AB (ref <5.7)
Mean Plasma Glucose: 123 mg/dL — ABNORMAL HIGH (ref <117)

## 2014-06-21 LAB — MAGNESIUM: Magnesium: 1.9 mg/dL (ref 1.5–2.5)

## 2014-06-21 NOTE — Progress Notes (Signed)
Patient ID: Natalie Baker, female   DOB: 06-Feb-1955, 59 y.o.   MRN: [id number]  Complete Physical  Assessment and Plan:   1. Other specified hypothyroidism -patient reports taking it correctly -adjust as needed per labs - TSH  2. Palpitations -currently well controlled and has not had any  3. Hyperlipidemia -diet and exercise -if no improvement consider using a prescription medicaiton - Lipid panel  4. Vitamin D deficiency -cont supplement - Vit D  25 hydroxy (rtn osteoporosis monitoring)  5. Osteoporosis -Vit D and calcium recommended  6. Obesity -diet and exercise discussed and if improvement at next visit consider phentermine  7. Screening for rectal cancer  - POC Hemoccult Bld/Stl (3-Cd Home Screen); Future  8. Medication management  - CBC with Differential/Platelet - BASIC METABOLIC PANEL WITH GFR - Hepatic function panel - Magnesium  9. Prediabetes  - Hemoglobin A1c - Insulin, random  10. Other fatigue  - Iron and TIBC - Vitamin B12  11. Elevated BP  - Urinalysis, Routine w reflex microscopic (not at [facility]) - Microalbumin / creatinine urine ratio - EKG 12-Lead - [place], RETROPERITNL ABD,  LTD    Discussed med's effects and SE's. Screening labs and tests as requested with regular follow-up as recommended.  HPI  59 y.o. female  presents for a complete physical.  Her blood pressure has been controlled at home, today their BP is BP: 138/84 mmHg.  She does workout.  She has been trying to do some walking.  She reports that the walking has been minimal.  She denies chest pain, shortness of breath, dizziness.   She is on cholesterol medication and denies myalgias. Her cholesterol is not at goal. The cholesterol last visit was:  Lab Results  Component Value Date   CHOL 215* 10/11/2013   HDL 55 10/11/2013   LDLCALC 126* 10/11/2013   TRIG 168* 10/11/2013   CHOLHDL 3.9 10/11/2013  .  She has been working on diet and exercise for prediabetes, she is  not on bASA, she is not on ACE/ARB and denies foot ulcerations, hyperglycemia, hypoglycemia , increased appetite, nausea, paresthesia of the feet, polydipsia, polyuria, visual disturbances, vomiting and weight loss. Last A1C in the office was:  Lab Results  Component Value Date   HGBA1C 6.1* 10/11/2013  She reports that she has been trying to eat some baked meats with a green veggie and also a bean or another vegetable.  She reports that they have cut out breads and rice.    Patient is on Vitamin D supplement.   Lab Results  Component Value Date   VD25OH 37 10/11/2013     She is afraid that her thyroid is not doing well.   She reports that she has been gaining weight, itching skin, cold intolerance, and dry hair and nails.     Current Medications:  Current Outpatient Prescriptions on File Prior to Visit  Medication Sig Dispense Refill  . b complex vitamins tablet Take 1 tablet by mouth daily.    . calcium-vitamin D (OSCAL WITH D) 250-125 MG-UNIT per tablet Take 1 tablet by mouth daily.    . Coenzyme Q10 (COQ10 PO) Take 1 tablet by mouth 2 (two) times daily.    . Flaxseed, Linseed, (FLAX SEED OIL PO) Take 2 capsules by mouth 2 (two) times daily.    . hyoscyamine (LEVSIN SL) 0.125 MG SL tablet Place 1 tablet (0.125 mg total) under the tongue every 4 (four) hours as needed for cramping (diarrhea). 60 tablet 1  .  IRON PO Take 1 tablet by mouth daily.    Marland Kitchen levothyroxine (SYNTHROID, LEVOTHROID) 75 MCG tablet TAKE 1 TABLET BY MOUTH EVERY DAY... 30 tablet 0  . MAGNESIUM PO Take 1 tablet by mouth daily.    . Multiple Vitamin (MULTIVITAMIN) capsule Take 1 capsule by mouth daily.    . Omega-3 Fatty Acids (FISH OIL PO) Take 2 capsules by mouth 2 (two) times daily.    . Red Yeast Rice Extract (RED YEAST RICE PO) Take 2 tablets by mouth 2 (two) times daily.     No current facility-administered medications on file prior to visit.    Health Maintenance:   Immunization History  Administered Date(s)  Administered  . Influenza Split 11/06/2010    Tetanus: 2008 Pap: 07/08/11 MGM: 12/2013 DEXA: 2003 Colonoscopy: 12/29/2006 Last Dental Exam: Dr. Britta Mccreedy and Sarajane Jews Last Eye Exam: Dr. Danella Maiers  Patient Care Team: [name], MD as PCP - General (Internal Medicine) Gatha Mayer, MD as Consulting Physician (Gastroenterology) Paula Compton, MD as Consulting Physician (Obstetrics and Gynecology) Druscilla Brownie, MD as Consulting Physician (Dermatology)  Allergies:  Allergies  Allergen Reactions  . Ppd [Tuberculin Purified Protein Derivative]     Positive reaction.  Negative Chest Xray 06/16/12    Medical History:  Past Medical History  Diagnosis Date  . Hyperlipidemia   . Vitamin D deficiency   . Osteoporosis   . Hypothyroidism   . Palpitations   . Colon polyp 12/29/2006  . Obesity     BMI 34    Surgical History:  Past Surgical History  Procedure Laterality Date  . Abdominal hysterectomy  1997  . Cesarean section  1983  . Colonoscopy w/ biopsies  12/29/2006    Family History:  Family History  Problem Relation Age of Onset  . Heart disease Mother 1    chf  . Diabetes Mother   . Thyroid disease Mother   . Cancer Mother 70    female/ skin  . Hypertension Mother   . Breast cancer Maternal Aunt   . Hyperlipidemia Maternal Aunt   . Heart disease Maternal Aunt   . Hypertension Father   . Alcohol abuse Father   . Hyperlipidemia Brother   . Hypertension Brother     x 2  . Heart disease Father   . Liver disease Mother   . Liver disease Maternal Grandmother   . Diabetes Paternal Grandmother   . Bone cancer Maternal Aunt     and skull    Social History:  History  Substance Use Topics  . Smoking status: Former Smoker -- 15 years    Types: Cigarettes    Quit date: 01/08/1996  . Smokeless tobacco: Not on file     Comment: quit 1998 but is exposed to 2nd hand smoke  . Alcohol Use: No    Review of Systems: Review of Systems  Constitutional:  Positive for malaise/fatigue. Negative for fever, chills and weight loss.  HENT: Negative for congestion, ear pain and sore throat.   Eyes: Negative.   Respiratory: Negative for cough, shortness of breath and wheezing.   Cardiovascular: Negative for chest pain, palpitations and leg swelling.  Gastrointestinal: Negative for heartburn, nausea, vomiting, diarrhea, constipation, blood in stool and melena.  Genitourinary: Negative.   Skin: Positive for itching.  Neurological: Negative for dizziness, tingling, sensory change, loss of consciousness and headaches.  Psychiatric/Behavioral: Negative for depression. The patient is not nervous/anxious and does not have insomnia.     Physical Exam: Estimated body mass index  is 35.57 kg/(m^2) as calculated from the following:   Height as of this encounter: 5' 2.25" (1.581 m).   Weight as of this encounter: 196 lb (88.905 kg). BP 138/84 mmHg  Pulse 72  Temp(Src) 98 F (36.7 C) (Temporal)  Resp 18  Ht 5' 2.25" (1.581 m)  Wt 196 lb (88.905 kg)  BMI 35.57 kg/m2  General Appearance: Well nourished well developed, in no apparent distress.  Eyes: PERRLA, EOMs, conjunctiva no swelling or erythema ENT/Mouth: Ear canals normal without obstruction, swelling, erythema, or discharge.  TMs normal bilaterally with no erythema, bulging, retraction, or loss of landmark.  Oropharynx moist and clear with no exudate, erythema, or swelling.   Neck: Supple, thyroid normal. No bruits.  No cervical adenopathy Respiratory: Respiratory effort normal, Breath sounds clear A&P without wheeze, rhonchi, rales.   Cardio: RRR without murmurs, rubs or gallops. Brisk peripheral pulses without edema.  Chest: symmetric, with normal excursions Breasts: Symmetric, without lumps, nipple discharge, retractions.  Abdomen: Morbidly obese, Soft, nontender, no guarding, rebound, hernias, masses, or organomegaly.  Lymphatics: Non tender without lymphadenopathy.  Musculoskeletal: Full ROM  all peripheral extremities,5/5 strength, and normal gait.  Skin: Warm, dry without rashes, lesions, ecchymosis. Neuro: Awake and oriented X 3, Cranial nerves intact, reflexes equal bilaterally. Normal muscle tone, no cerebellar symptoms. Sensation intact.  Psych:  normal affect, Insight and Judgment appropriate.   EKG: WNL no changes.  AORTA SCAN: WNL   Over 40 minutes of exam, counseling, chart review and critical decision making was performed  FORCUCCI, Gerturde Kuba 10:43 AM [facility]

## 2014-06-21 NOTE — Patient Instructions (Signed)
We want weight loss that will last so you should lose 1-2 pounds a week.  THAT IS IT! Please pick THREE things a month to change. Once it is a habit check off the item. Then pick another three items off the list to become habits.  If you are already doing a habit on the list GREAT!  Cross that item off! o Don't drink your calories. Ie, alcohol, soda, fruit juice, and sweet tea.  o Drink more water. Drink a glass when you feel hungry or before each meal.  o Eat breakfast - Complex carb and protein (likeDannon light and fit yogurt, oatmeal, fruit, eggs, [place] bacon). o Measure your cereal.  Eat no more than one cup a day. (ie [place]) o Eat an apple a day. o Add a vegetable a day. o Try a new vegetable a month. o Use Pam! Stop using oil or butter to cook. o Don't finish your plate or use smaller plates. o Share your dessert. o Eat sugar free Jello for dessert or frozen grapes. o Don't eat 2-3 hours before bed. o Switch to whole wheat bread, pasta, and brown rice. o Make healthier choices when you eat out. No fries! o Pick baked chicken, NOT fried. o Don't forget to SLOW DOWN when you eat. It is not going anywhere.  o Take the stairs. o Park far away in the parking lot o [employer] (or weights) for 10 minutes while watching TV. o Walk at work for 10 minutes during break. o Walk outside 1 time a week with your friend, kids, dog, or significant other. o Start a walking group at [place] the mall as much as you can tolerate.  o Keep a food diary. o Weigh yourself daily. o Walk for 15 minutes 3 days per week. o Cook at home more often and eat out less.  If life happens and you go back to old habits, it is okay.  Just start over. You can do it!   If you experience chest pain, get short of breath, or tired during the exercise, please stop immediately and inform your doctor.    Preventive Care for Adults  A healthy lifestyle and preventive care can promote health and wellness.  Preventive health guidelines for women include the following key practices.  A routine yearly physical is a good way to check with your health care provider about your health and preventive screening. It is a chance to share any concerns and updates on your health and to receive a thorough exam.  Visit your dentist for a routine exam and preventive care every 6 months. Brush your teeth twice a day and floss once a day. Good oral hygiene prevents tooth decay and gum disease.  The frequency of eye exams is based on your age, health, family medical history, use of contact lenses, and other factors. Follow your health care provider's recommendations for frequency of eye exams.  Eat a healthy diet. Foods like vegetables, fruits, whole grains, low-fat dairy products, and lean protein foods contain the nutrients you need without too many calories. Decrease your intake of foods high in solid fats, added sugars, and salt. Eat the right amount of calories for you.Get information about a proper diet from your health care provider, if necessary.  Regular physical exercise is one of the most important things you can do for your health. Most adults should get at least 150 minutes of moderate-intensity exercise (any activity that increases your heart rate and  causes you to sweat) each week. In addition, most adults need muscle-strengthening exercises on 2 or more days a week.  Maintain a healthy weight. The body mass index (BMI) is a screening tool to identify possible weight problems. It provides an estimate of body fat based on height and weight. Your health care provider can find your BMI and can help you achieve or maintain a healthy weight.For adults 20 years and older:  A BMI below 18.5 is considered underweight.  A BMI of 18.5 to 24.9 is normal.  A BMI of 25 to 29.9 is considered overweight.  A BMI of 30 and above is considered obese.  Maintain normal blood lipids and cholesterol levels by exercising  and minimizing your intake of saturated fat. Eat a balanced diet with plenty of fruit and vegetables. Blood tests for lipids and cholesterol should begin at age 53 and be repeated every 5 years. If your lipid or cholesterol levels are high, you are over 50, or you are at high risk for heart disease, you may need your cholesterol levels checked more frequently.Ongoing high lipid and cholesterol levels should be treated with medicines if diet and exercise are not working.  If you smoke, find out from your health care provider how to quit. If you do not use tobacco, do not start.  Lung cancer screening is recommended for adults aged 8-80 years who are at high risk for developing lung cancer because of a history of smoking. A yearly low-dose CT scan of the lungs is recommended for people who have at least a 30-pack-year history of smoking and are a current smoker or have quit within the past 15 years. A pack year of smoking is smoking an average of 1 pack of cigarettes a day for 1 year (for example: 1 pack a day for 30 years or 2 packs a day for 15 years). Yearly screening should continue until the smoker has stopped smoking for at least 15 years. Yearly screening should be stopped for people who develop a health problem that would prevent them from having lung cancer treatment.  High blood pressure causes heart disease and increases the risk of stroke. Your blood pressure should be checked at least every 1 to 2 years. Ongoing high blood pressure should be treated with medicines if weight loss and exercise do not work.  If you are 38-77 years old, ask your health care provider if you should take aspirin to prevent strokes.  Diabetes screening involves taking a blood sample to check your fasting blood sugar level. This should be done once every 3 years, after age 58, if you are within normal weight and without risk factors for diabetes. Testing should be considered at a younger age or be carried out more  frequently if you are overweight and have at least 1 risk factor for diabetes.  Breast cancer screening is essential preventive care for women. You should practice "breast self-awareness." This means understanding the normal appearance and feel of your breasts and may include breast self-examination. Any changes detected, no matter how small, should be reported to a health care provider. Women in their 59s and 30s should have a clinical breast exam (CBE) by a health care provider as part of a regular health exam every 1 to 3 years. After age 32, women should have a CBE every year. Starting at age 67, women should consider having a mammogram (breast X-ray test) every year. Women who have a family history of breast cancer should talk to  their health care provider about genetic screening. Women at a high risk of breast cancer should talk to their health care providers about having an MRI and a mammogram every year.  Breast cancer gene (BRCA)-related cancer risk assessment is recommended for women who have family members with BRCA-related cancers. BRCA-related cancers include breast, ovarian, tubal, and peritoneal cancers. Having family members with these cancers may be associated with an increased risk for harmful changes (mutations) in the breast cancer genes BRCA1 and BRCA2. Results of the assessment will determine the need for genetic counseling and BRCA1 and BRCA2 testing.  Routine pelvic exams to screen for cancer are no longer recommended for nonpregnant women who are considered low risk for cancer of the pelvic organs (ovaries, uterus, and vagina) and who do not have symptoms. Ask your health care provider if a screening pelvic exam is right for you.  If you have had past treatment for cervical cancer or a condition that could lead to cancer, you need Pap tests and screening for cancer for at least 20 years after your treatment. If Pap tests have been discontinued, your risk factors (such as having a new  sexual partner) need to be reassessed to determine if screening should be resumed. Some women have medical problems that increase the chance of getting cervical cancer. In these cases, your health care provider may recommend more frequent screening and Pap tests.  Colorectal cancer can be detected and often prevented. Most routine colorectal cancer screening begins at the age of 38 years and continues through age 18 years. However, your health care provider may recommend screening at an earlier age if you have risk factors for colon cancer. On a yearly basis, your health care provider may provide home test kits to check for hidden blood in the stool. Use of a small camera at the end of a tube, to directly examine the colon (sigmoidoscopy or colonoscopy), can detect the earliest forms of colorectal cancer. Talk to your health care provider about this at age 16, when routine screening begins. Direct exam of the colon should be repeated every 5-10 years through age 77 years, unless early forms of pre-cancerous polyps or small growths are found.  Hepatitis C blood testing is recommended for all people born from [birth year] through 1965 and any individual with known risks for hepatitis C.  Pra  Osteoporosis is a disease in which the bones lose minerals and strength with aging. This can result in serious bone fractures or breaks. The risk of osteoporosis can be identified using a bone density scan. Women ages 57 years and over and women at risk for fractures or osteoporosis should discuss screening with their health care providers. Ask your health care provider whether you should take a calcium supplement or vitamin D to reduce the rate of osteoporosis.  Menopause can be associated with physical symptoms and risks. Hormone replacement therapy is available to decrease symptoms and risks. You should talk to your health care provider about whether hormone replacement therapy is right for you.  Use sunscreen. Apply  sunscreen liberally and repeatedly throughout the day. You should seek shade when your shadow is shorter than you. Protect yourself by wearing long sleeves, pants, a wide-brimmed hat, and sunglasses year round, whenever you are outdoors.  Once a month, do a whole body skin exam, using a mirror to look at the skin on your back. Tell your health care provider of new moles, moles that have irregular borders, moles that are larger than a pencil  eraser, or moles that have changed in shape or color.  Stay current with required vaccines (immunizations).  Influenza vaccine. All adults should be immunized every year.  Tetanus, diphtheria, and acellular pertussis (Td, Tdap) vaccine. Pregnant women should receive 1 dose of Tdap vaccine during each pregnancy. The dose should be obtained regardless of the length of time since the last dose. Immunization is preferred during the 27th-36th week of gestation. An adult who has not previously received Tdap or who does not know her vaccine status should receive 1 dose of Tdap. This initial dose should be followed by tetanus and diphtheria toxoids (Td) booster doses every 10 years. Adults with an unknown or incomplete history of completing a 3-dose immunization series with Td-containing vaccines should begin or complete a primary immunization series including a Tdap dose. Adults should receive a Td booster every 10 years.  Varicella vaccine. An adult without evidence of immunity to varicella should receive 2 doses or a second dose if she has previously received 1 dose. Pregnant females who do not have evidence of immunity should receive the first dose after pregnancy. This first dose should be obtained before leaving the health care facility. The second dose should be obtained 4-8 weeks after the first dose.  Human papillomavirus (HPV) vaccine. Females aged 13-26 years who have not received the vaccine previously should obtain the 3-dose series. The vaccine is not  recommended for use in pregnant females. However, pregnancy testing is not needed before receiving a dose. If a female is found to be pregnant after receiving a dose, no treatment is needed. In that case, the remaining doses should be delayed until after the pregnancy. Immunization is recommended for any person with an immunocompromised condition through the age of 67 years if she did not get any or all doses earlier. During the 3-dose series, the second dose should be obtained 4-8 weeks after the first dose. The third dose should be obtained 24 weeks after the first dose and 16 weeks after the second dose.  Zoster vaccine. One dose is recommended for adults aged 22 years or older unless certain conditions are present.  Measles, mumps, and rubella (MMR) vaccine. Adults born before [birth year] generally are considered immune to measles and mumps. Adults born in [birth year] or later should have 1 or more doses of MMR vaccine unless there is a contraindication to the vaccine or there is laboratory evidence of immunity to each of the three diseases. A routine second dose of MMR vaccine should be obtained at least 28 days after the first dose for students attending postsecondary schools, health care workers, or international travelers. People who received inactivated measles vaccine or an unknown type of measles vaccine during 1963-1967 should receive 2 doses of MMR vaccine. People who received inactivated mumps vaccine or an unknown type of mumps vaccine before 1979 and are at high risk for mumps infection should consider immunization with 2 doses of MMR vaccine. For females of childbearing age, rubella immunity should be determined. If there is no evidence of immunity, females who are not pregnant should be vaccinated. If there is no evidence of immunity, females who are pregnant should delay immunization until after pregnancy. Unvaccinated health care workers born before [birth year] who lack laboratory evidence of measles, mumps, or  rubella immunity or laboratory confirmation of disease should consider measles and mumps immunization with 2 doses of MMR vaccine or rubella immunization with 1 dose of MMR vaccine.  Pneumococcal 13-valent conjugate (PCV13) vaccine. When indicated, a person who  is uncertain of her immunization history and has no record of immunization should receive the PCV13 vaccine. An adult aged 55 years or older who has certain medical conditions and has not been previously immunized should receive 1 dose of PCV13 vaccine. This PCV13 should be followed with a dose of pneumococcal polysaccharide (PPSV23) vaccine. The PPSV23 vaccine dose should be obtained at least 8 weeks after the dose of PCV13 vaccine. An adult aged 47 years or older who has certain medical conditions and previously received 1 or more doses of PPSV23 vaccine should receive 1 dose of PCV13. The PCV13 vaccine dose should be obtained 1 or more years after the last PPSV23 vaccine dose.    Pneumococcal polysaccharide (PPSV23) vaccine. When PCV13 is also indicated, PCV13 should be obtained first. All adults aged 45 years and older should be immunized. An adult younger than age 81 years who has certain medical conditions should be immunized. Any person who resides in a nursing home or long-term care facility should be immunized. An adult smoker should be immunized. People with an immunocompromised condition and certain other conditions should receive both PCV13 and PPSV23 vaccines. People with human immunodeficiency virus (HIV) infection should be immunized as soon as possible after diagnosis. Immunization during chemotherapy or radiation therapy should be avoided. Routine use of PPSV23 vaccine is not recommended for American Indians, [place] Natives, or people younger than 65 years unless there are medical conditions that require PPSV23 vaccine. When indicated, people who have unknown immunization and have no record of immunization should receive PPSV23 vaccine.  One-time revaccination 5 years after the first dose of PPSV23 is recommended for people aged 19-64 years who have chronic kidney failure, nephrotic syndrome, asplenia, or immunocompromised conditions. People who received 1-2 doses of PPSV23 before age 84 years should receive another dose of PPSV23 vaccine at age 42 years or later if at least 5 years have passed since the previous dose. Doses of PPSV23 are not needed for people immunized with PPSV23 at or after age 18 years.  Preventive Services / Frequency   Ages 43 to 50 years  Blood pressure check.  Lipid and cholesterol check.  Lung cancer screening. / Every year if you are aged 66-80 years and have a 30-pack-year history of smoking and currently smoke or have quit within the past 15 years. Yearly screening is stopped once you have quit smoking for at least 15 years or develop a health problem that would prevent you from having lung cancer treatment.  Clinical breast exam.** / Every year after age 28 years.  BRCA-related cancer risk assessment.** / For women who have family members with a BRCA-related cancer (breast, ovarian, tubal, or peritoneal cancers).  Mammogram.** / Every year beginning at age 71 years and continuing for as long as you are in good health. Consult with your health care provider.  Pap test.** / Every 3 years starting at age 77 years through age 17 or 54 years with a history of 3 consecutive normal Pap tests.  HPV screening.** / Every 3 years from ages 79 years through ages 40 to 71 years with a history of 3 consecutive normal Pap tests.  Fecal occult blood test (FOBT) of stool. / Every year beginning at age 37 years and continuing until age 42 years. You may not need to do this test if you get a colonoscopy every 10 years.  Flexible sigmoidoscopy or colonoscopy.** / Every 5 years for a flexible sigmoidoscopy or every 10 years for a colonoscopy beginning at  age 68 years and continuing until age 76 years.  Hepatitis  C blood test.** / For all people born from [birth year] through 1965 and any individual with known risks for hepatitis C.  Skin self-exam. / Monthly.  Influenza vaccine. / Every year.  Tetanus, diphtheria, and acellular pertussis (Tdap/Td) vaccine.** / Consult your health care provider. Pregnant women should receive 1 dose of Tdap vaccine during each pregnancy. 1 dose of Td every 10 years.  Varicella vaccine.** / Consult your health care provider. Pregnant females who do not have evidence of immunity should receive the first dose after pregnancy.  Zoster vaccine.** / 1 dose for adults aged 50 years or older.  Pneumococcal 13-valent conjugate (PCV13) vaccine.** / Consult your health care provider.  Pneumococcal polysaccharide (PPSV23) vaccine.** / 1 to 2 doses if you smoke cigarettes or if you have certain conditions.  Meningococcal vaccine.** / Consult your health care provider.  Hepatitis A vaccine.** / Consult your health care provider.  Hepatitis B vaccine.** / Consult your health care provider. Screening for abdominal aortic aneurysm (AAA)  by ultrasound is recommended for people over 50 who have history of high blood pressure or who are current or former smokers.

## 2014-06-22 ENCOUNTER — Other Ambulatory Visit: Payer: Self-pay | Admitting: Internal Medicine

## 2014-06-22 LAB — URINALYSIS, ROUTINE W REFLEX MICROSCOPIC
BILIRUBIN URINE: NEGATIVE
Glucose, UA: NEGATIVE mg/dL
Hgb urine dipstick: NEGATIVE
KETONES UR: NEGATIVE mg/dL
LEUKOCYTES UA: NEGATIVE
Nitrite: NEGATIVE
PH: 7 (ref 5.0–8.0)
Protein, ur: NEGATIVE mg/dL
Specific Gravity, Urine: 1.006 (ref 1.005–1.030)
Urobilinogen, UA: 0.2 mg/dL (ref 0.0–1.0)

## 2014-06-22 LAB — MICROALBUMIN / CREATININE URINE RATIO
CREATININE, URINE: 34.2 mg/dL
MICROALB UR: 0.3 mg/dL (ref <2.0)
Microalb Creat Ratio: 8.8 mg/g (ref 0.0–30.0)

## 2014-06-22 LAB — VITAMIN D 25 HYDROXY (VIT D DEFICIENCY, FRACTURES): Vit D, 25-Hydroxy: 23 ng/mL — ABNORMAL LOW (ref 30–100)

## 2014-06-22 LAB — INSULIN, RANDOM: Insulin: 4.7 u[IU]/mL (ref 2.0–19.6)

## 2014-06-22 MED ORDER — LEVOTHYROXINE SODIUM 112 MCG PO TABS: ORAL_TABLET | ORAL | Status: DC

## 2014-07-01 ENCOUNTER — Other Ambulatory Visit: Payer: Self-pay | Admitting: Physician Assistant

## 2014-08-15 ENCOUNTER — Ambulatory Visit (INDEPENDENT_AMBULATORY_CARE_PROVIDER_SITE_OTHER): Payer: 59 | Admitting: Internal Medicine

## 2014-08-15 ENCOUNTER — Encounter: Payer: Self-pay | Admitting: Internal Medicine

## 2014-08-15 VITALS — BP 118/80 | HR 110 | Temp 98.0°F | Resp 18 | Ht 62.25 in | Wt 194.0 lb

## 2014-08-15 DIAGNOSIS — J069 Acute upper respiratory infection, unspecified: Secondary | ICD-10-CM

## 2014-08-15 MED ORDER — FLUTICASONE PROPIONATE 50 MCG/ACT NA SUSP: 2.0000 | Freq: Every day | NASAL | Status: DC

## 2014-08-15 MED ORDER — BENZONATATE 200 MG PO CAPS
200.0000 mg | ORAL_CAPSULE | Freq: Three times a day (TID) | ORAL | Status: DC | PRN
Start: 2014-08-15 — End: 2014-09-26

## 2014-08-15 MED ORDER — AZITHROMYCIN 250 MG PO TABS: ORAL_TABLET | ORAL | Status: DC

## 2014-08-15 MED ORDER — PREDNISONE 20 MG PO TABS: ORAL_TABLET | ORAL | Status: DC

## 2014-08-15 NOTE — Progress Notes (Signed)
Patient ID: Natalie Baker, female   DOB: [birth year], 59 y.o.   MRN: [id number]  HPI  Patient presents to the office for evaluation of cough.  It has been going on for 2 weeks.  Patient reports night > day, dry, worse with lying down.  They also endorse change in voice, chills, fever, postnasal drip, shortness of breath, wheezing and sinus pressure and congestion, sore throat, and clear watery nasal mucous production.  .  They have tried antitussives, antihistamines or sinus cold medicaiton during the day.  They report that nothing has worked.  They admits to other sick contacts.  She reports that coworkers have been experiencing similar conditions.  Review of Systems  Constitutional: Positive for fever, chills and malaise/fatigue. Negative for weight loss and diaphoresis.  HENT: Positive for congestion and sore throat. Negative for ear pain.   Eyes: Positive for discharge.  Respiratory: Positive for cough, shortness of breath and wheezing.   Cardiovascular: Negative for chest pain, palpitations and leg swelling.  Neurological: Positive for headaches.    PE:  General:  Alert and non-toxic, WDWN, NAD HEENT: NCAT, PERLA, EOM normal, no occular discharge or erythema.  Nasal mucosal edema with sinus tenderness to palpation.  Oropharynx clear with minimal oropharyngeal edema and erythema.  Mucous membranes moist and pink. Neck:  Cervical adenopathy Chest:  RRR no MRGs.  Lungs clear to auscultation A&P with no wheezes rhonchi or rales.   Abdomen: +BS x 4 quadrants, soft, non-tender, no guarding, rigidity, or rebound. Skin: warm and dry no rash Neuro: A&Ox4, CN II-XII grossly intact  Assessment and Plan:   1. Acute URI -nasal saline -mucinex -cont zyrtec - predniSONE (DELTASONE) 20 MG tablet; 3 tabs po daily x 3 days, then 2 tabs x 3 days, then 1.5 tabs x 3 days, then 1 tab x 3 days, then 0.5 tabs x 3 days  Dispense: 27 tablet; Refill: 0 - azithromycin (ZITHROMAX Z-PAK) 250 MG tablet; 2 po  day one, then 1 daily x 4 days  Dispense: 5 tablet; Refill: 0 - fluticasone (FLONASE) 50 MCG/ACT nasal spray; Place 2 sprays into both nostrils daily.  Dispense: 16 g; Refill: 0 - benzonatate (TESSALON) 200 MG capsule; Take 1 capsule (200 mg total) by mouth 3 (three) times daily as needed for cough.  Dispense: 30 capsule; Refill: 1   Go to ER if worsening shortness of breath, intractable fever or any other concerning factors.

## 2014-08-15 NOTE — Patient Instructions (Signed)
Upper Respiratory Infection, Adult An upper respiratory infection (URI) is also sometimes known as the common cold. The upper respiratory tract includes the nose, sinuses, throat, trachea, and bronchi. Bronchi are the airways leading to the lungs. Most people improve within 1 week, but symptoms can last up to 2 weeks. A residual cough may last even longer.  CAUSES Many different viruses can infect the tissues lining the upper respiratory tract. The tissues become irritated and inflamed and often become very moist. Mucus production is also common. A cold is contagious. You can easily spread the virus to others by oral contact. This includes kissing, sharing a glass, coughing, or sneezing. Touching your mouth or nose and then touching a surface, which is then touched by another person, can also spread the virus. SYMPTOMS  Symptoms typically develop 1 to 3 days after you come in contact with a cold virus. Symptoms vary from person to person. They may include:  Runny nose.  Sneezing.  Nasal congestion.  Sinus irritation.  Sore throat.  Loss of voice (laryngitis).  Cough.  Fatigue.  Muscle aches.  Loss of appetite.  Headache.  Low-grade fever. DIAGNOSIS  You might diagnose your own cold based on familiar symptoms, since most people get a cold 2 to 3 times a year. Your caregiver can confirm this based on your exam. Most importantly, your caregiver can check that your symptoms are not due to another disease such as strep throat, sinusitis, pneumonia, asthma, or epiglottitis. Blood tests, throat tests, and X-rays are not necessary to diagnose a common cold, but they may sometimes be helpful in excluding other more serious diseases. Your caregiver will decide if any further tests are required. RISKS AND COMPLICATIONS  You may be at risk for a more severe case of the common cold if you smoke cigarettes, have chronic heart disease (such as heart failure) or lung disease (such as asthma), or if  you have a weakened immune system. The very young and very old are also at risk for more serious infections. Bacterial sinusitis, middle ear infections, and bacterial pneumonia can complicate the common cold. The common cold can worsen asthma and chronic obstructive pulmonary disease (COPD). Sometimes, these complications can require emergency medical care and may be life-threatening. PREVENTION  The best way to protect against getting a cold is to practice good hygiene. Avoid oral or hand contact with people with cold symptoms. Wash your hands often if contact occurs. There is no clear evidence that vitamin C, vitamin E, echinacea, or exercise reduces the chance of developing a cold. However, it is always recommended to get plenty of rest and practice good nutrition. TREATMENT  Treatment is directed at relieving symptoms. There is no cure. Antibiotics are not effective, because the infection is caused by a virus, not by bacteria. Treatment may include:  Increased fluid intake. Sports drinks offer valuable electrolytes, sugars, and fluids.  Breathing heated mist or steam (vaporizer or shower).  Eating chicken soup or other clear broths, and maintaining good nutrition.  Getting plenty of rest.  Using gargles or lozenges for comfort.  Controlling fevers with ibuprofen or acetaminophen as directed by your caregiver.  Increasing usage of your inhaler if you have asthma. Zinc gel and zinc lozenges, taken in the first 24 hours of the common cold, can shorten the duration and lessen the severity of symptoms. Pain medicines may help with fever, muscle aches, and throat pain. A variety of non-prescription medicines are available to treat congestion and runny nose. Your caregiver   can make recommendations and may suggest nasal or lung inhalers for other symptoms.  HOME CARE INSTRUCTIONS   Only take over-the-counter or prescription medicines for pain, discomfort, or fever as directed by your  caregiver.  Use a warm mist humidifier or inhale steam from a shower to increase air moisture. This may keep secretions moist and make it easier to breathe.  Drink enough water and fluids to keep your urine clear or pale yellow.  Rest as needed.  Return to work when your temperature has returned to normal or as your caregiver advises. You may need to stay home longer to avoid infecting others. You can also use a face mask and careful hand washing to prevent spread of the virus. SEEK MEDICAL CARE IF:   After the first few days, you feel you are getting worse rather than better.  You need your caregiver's advice about medicines to control symptoms.  You develop chills, worsening shortness of breath, or brown or red sputum. These may be signs of pneumonia.  You develop yellow or brown nasal discharge or pain in the face, especially when you bend forward. These may be signs of sinusitis.  You develop a fever, swollen neck glands, pain with swallowing, or white areas in the back of your throat. These may be signs of strep throat. SEEK IMMEDIATE MEDICAL CARE IF:   You have a fever.  You develop severe or persistent headache, ear pain, sinus pain, or chest pain.  You develop wheezing, a prolonged cough, cough up blood, or have a change in your usual mucus (if you have chronic lung disease).  You develop sore muscles or a stiff neck. Document Released: 06/19/2000 Document Revised: 03/18/2011 Document Reviewed: 03/31/2013 [employer] Patient Information 2015 [employer]. This information is not intended to replace advice given to you by your health care provider. Make sure you discuss any questions you have with your health care provider.  

## 2014-09-23 ENCOUNTER — Ambulatory Visit: Payer: Self-pay | Admitting: Internal Medicine

## 2014-09-23 ENCOUNTER — Other Ambulatory Visit: Payer: Self-pay | Admitting: Internal Medicine

## 2014-09-26 ENCOUNTER — Ambulatory Visit (INDEPENDENT_AMBULATORY_CARE_PROVIDER_SITE_OTHER): Payer: 59 | Admitting: Internal Medicine

## 2014-09-26 VITALS — BP 130/80 | HR 72 | Temp 97.3°F | Resp 16 | Wt 199.0 lb

## 2014-09-26 DIAGNOSIS — Z79899 Other long term (current) drug therapy: Secondary | ICD-10-CM | POA: Diagnosis not present

## 2014-09-26 DIAGNOSIS — E039 Hypothyroidism, unspecified: Secondary | ICD-10-CM | POA: Diagnosis not present

## 2014-09-26 DIAGNOSIS — E559 Vitamin D deficiency, unspecified: Secondary | ICD-10-CM | POA: Diagnosis not present

## 2014-09-26 DIAGNOSIS — E785 Hyperlipidemia, unspecified: Secondary | ICD-10-CM

## 2014-09-26 DIAGNOSIS — R7309 Other abnormal glucose: Secondary | ICD-10-CM

## 2014-09-26 NOTE — Progress Notes (Signed)
Patient ID: ARIONA DESCHENE, female   DOB: [birth year], 59 y.o.   MRN: [id number]  Assessment and Plan:  Hypertension:  -Continue medication,  -monitor blood pressure at home.  -Continue DASH diet.   -Reminder to go to the ER if any CP, SOB, nausea, dizziness, severe HA, changes vision/speech, left arm numbness and tingling, and jaw pain.  Cholesterol: -Continue diet and exercise.  -Check cholesterol.   Pre-diabetes: -Continue diet and exercise.  -Check A1C  Vitamin D Def: -check level -continue medications.   Continue diet and meds as discussed. Further disposition pending results of labs.  HPI 59 y.o. female  presents for 3 month follow up with hypertension, hyperlipidemia, prediabetes and vitamin D.   Her blood pressure has been controlled at home, today their BP is BP: 130/80 mmHg.   She does workout. She denies chest pain, shortness of breath, dizziness.   She is on cholesterol medication and denies myalgias. Her cholesterol is not at goal. The cholesterol last visit was:   Lab Results  Component Value Date   CHOL 236* 06/21/2014   HDL 61 06/21/2014   LDLCALC 146* 06/21/2014   TRIG 147 06/21/2014   CHOLHDL 3.9 06/21/2014     She has been working on diet and exercise for prediabetes, and denies foot ulcerations, hyperglycemia, hypoglycemia , increased appetite, nausea, paresthesia of the feet, polydipsia, polyuria, visual disturbances, vomiting and weight loss. Last A1C in the office was:  Lab Results  Component Value Date   HGBA1C 5.9* 06/21/2014    Patient is on Vitamin D supplement.  Lab Results  Component Value Date   VD25OH 23* 06/21/2014      Current Medications:  Current Outpatient Prescriptions on File Prior to Visit  Medication Sig Dispense Refill  . b complex vitamins tablet Take 1 tablet by mouth daily.    . calcium-vitamin D (OSCAL WITH D) 250-125 MG-UNIT per tablet Take 1 tablet by mouth daily.    . Coenzyme Q10 (COQ10 PO) Take 1 tablet by mouth  2 (two) times daily.    Marland Kitchen estradiol (VIVELLE-DOT) 0.075 MG/24HR Place 1 patch onto the skin 2 (two) times a week.    . Flaxseed, Linseed, (FLAX SEED OIL PO) Take 2 capsules by mouth 2 (two) times daily.    . fluticasone (FLONASE) 50 MCG/ACT nasal spray Place 2 sprays into both nostrils daily. 16 g 0  . hyoscyamine (LEVSIN SL) 0.125 MG SL tablet Place 1 tablet (0.125 mg total) under the tongue every 4 (four) hours as needed for cramping (diarrhea). 60 tablet 1  . IRON PO Take 1 tablet by mouth daily.    Marland Kitchen levothyroxine (SYNTHROID, LEVOTHROID) 112 MCG tablet TAKE 1 TABLET ON EMPTY STOMACH WITH SIP OF WATER IN AM.WAIT 30MINS TO AN HOUR BEFORE EATING 90 tablet 0  . MAGNESIUM PO Take 1 tablet by mouth daily.    . Multiple Vitamin (MULTIVITAMIN) capsule Take 1 capsule by mouth daily.    . Omega-3 Fatty Acids (FISH OIL PO) Take 2 capsules by mouth 2 (two) times daily.    . Red Yeast Rice Extract (RED YEAST RICE PO) Take 2 tablets by mouth 2 (two) times daily.     No current facility-administered medications on file prior to visit.    Medical History:  Past Medical History  Diagnosis Date  . Hyperlipidemia   . Vitamin D deficiency   . Osteoporosis   . Hypothyroidism   . Palpitations   . Colon polyp 12/29/2006  . Obesity  BMI 34    Allergies:  Allergies  Allergen Reactions  . Ppd [Tuberculin Purified Protein Derivative]     Positive reaction.  Negative Chest Xray 06/16/12     Review of Systems:  Review of Systems  Constitutional: Negative for fever, chills and malaise/fatigue.  HENT: Positive for sore throat. Negative for congestion and ear discharge.   Respiratory: Negative for cough, shortness of breath and wheezing.   Cardiovascular: Negative for chest pain, palpitations and leg swelling.  Gastrointestinal: Negative for heartburn, diarrhea, constipation, blood in stool and melena.  Genitourinary: Negative.   Neurological: Negative for dizziness, sensory change, loss of  consciousness and headaches.  Psychiatric/Behavioral: Negative for depression. The patient is not nervous/anxious and does not have insomnia.     Family history- Review and unchanged  Social history- Review and unchanged  Physical Exam: BP 130/80 mmHg  Pulse 72  Temp(Src) 97.3 F (36.3 C)  Resp 16  Wt 199 lb (90.266 kg) Wt Readings from Last 3 Encounters:  09/26/14 199 lb (90.266 kg)  08/15/14 194 lb (87.998 kg)  06/21/14 196 lb (88.905 kg)    General Appearance: Well nourished well developed, in no apparent distress. Eyes: PERRLA, EOMs, conjunctiva no swelling or erythema ENT/Mouth: Ear canals normal without obstruction, swelling, erythma, discharge.  TMs normal bilaterally.  Oropharynx moist, clear, without exudate, or postoropharyngeal swelling. Neck: Supple, thyroid normal,no cervical adenopathy  Respiratory: Respiratory effort normal, Breath sounds clear A&P without rhonchi, wheeze, or rale.  No retractions, no accessory usage. Cardio: RRR with no MRGs. Brisk peripheral pulses without edema.  Abdomen: Soft, + BS,  Non tender, no guarding, rebound, hernias, masses. Musculoskeletal: Full ROM, 5/5 strength, Normal gait Skin: Warm, dry without rashes, lesions, ecchymosis.  Neuro: Awake and oriented X 3, Cranial nerves intact. Normal muscle tone, no cerebellar symptoms. Psych: Normal affect, Insight and Judgment appropriate.    Starlyn Skeans, PA-C 4:34 PM [facility]

## 2014-09-27 LAB — BASIC METABOLIC PANEL WITH GFR
BUN: 12 mg/dL (ref 7–25)
CHLORIDE: 99 mmol/L (ref 98–110)
CO2: 29 mmol/L (ref 20–31)
CREATININE: 0.74 mg/dL (ref 0.50–1.05)
Calcium: 9.5 mg/dL (ref 8.6–10.4)
GFR, Est African American: >89 mL/min (ref >=60)
GFR, Est Non African American: >89 mL/min (ref >=60)
Glucose, Bld: 82 mg/dL (ref 65–99)
POTASSIUM: 4.3 mmol/L (ref 3.5–5.3)
SODIUM: 138 mmol/L (ref 135–146)

## 2014-09-27 LAB — INSULIN, RANDOM: INSULIN: 5.2 u[IU]/mL (ref 2.0–19.6)

## 2014-09-27 LAB — CBC WITH DIFFERENTIAL/PLATELET
BASOS ABS: 0.1 10*3/uL (ref 0.0–0.1)
BASOS PCT: 1 % (ref 0–1)
EOS ABS: 0.1 10*3/uL (ref 0.0–0.7)
Eosinophils Relative: 1 % (ref 0–5)
HCT: 41.5 % (ref 36.0–46.0)
HEMOGLOBIN: 13.7 g/dL (ref 12.0–15.0)
LYMPHS ABS: 2 10*3/uL (ref 0.7–4.0)
Lymphocytes Relative: 31 % (ref 12–46)
MCH: 26.6 pg (ref 26.0–34.0)
MCHC: 33 g/dL (ref 30.0–36.0)
MCV: 80.6 fL (ref 78.0–100.0)
MPV: 9.8 fL (ref 8.6–12.4)
Monocytes Absolute: 0.7 10*3/uL (ref 0.1–1.0)
Monocytes Relative: 10 % (ref 3–12)
NEUTROS PCT: 57 % (ref 43–77)
Neutro Abs: 3.7 10*3/uL (ref 1.7–7.7)
PLATELETS: 335 10*3/uL (ref 150–400)
RBC: 5.15 MIL/uL — ABNORMAL HIGH (ref 3.87–5.11)
RDW: 15.2 % (ref 11.5–15.5)
WBC: 6.5 10*3/uL (ref 4.0–10.5)

## 2014-09-27 LAB — VITAMIN D 25 HYDROXY (VIT D DEFICIENCY, FRACTURES): VIT D 25 HYDROXY: 22 ng/mL — AB (ref 30–100)

## 2014-09-27 LAB — HEPATIC FUNCTION PANEL
ALT: 21 U/L (ref 6–29)
AST: 19 U/L (ref 10–35)
Albumin: 4.3 g/dL (ref 3.6–5.1)
Alkaline Phosphatase: 71 U/L (ref 33–130)
Bilirubin, Direct: <0.1 mg/dL (ref <=0.2)
Total Bilirubin: 0.3 mg/dL (ref 0.2–1.2)
Total Protein: 6.6 g/dL (ref 6.1–8.1)

## 2014-09-27 LAB — LIPID PANEL
Cholesterol: 203 mg/dL — ABNORMAL HIGH (ref 125–200)
HDL: 50 mg/dL (ref >=46)
LDL Cholesterol: 113 mg/dL (ref <130)
Total CHOL/HDL Ratio: 4.1 Ratio (ref <=5.0)
Triglycerides: 199 mg/dL — ABNORMAL HIGH (ref <150)
VLDL: 40 mg/dL — AB (ref <30)

## 2014-09-27 LAB — HEMOGLOBIN A1C
HEMOGLOBIN A1C: 5.9 % — AB (ref <5.7)
MEAN PLASMA GLUCOSE: 123 mg/dL — AB (ref <117)

## 2014-09-27 LAB — TSH: TSH: 0.212 u[IU]/mL — AB (ref 0.350–4.500)

## 2014-09-27 LAB — MAGNESIUM: Magnesium: 1.9 mg/dL (ref 1.5–2.5)

## 2014-12-23 ENCOUNTER — Other Ambulatory Visit: Payer: Self-pay | Admitting: Internal Medicine

## 2014-12-27 ENCOUNTER — Ambulatory Visit (INDEPENDENT_AMBULATORY_CARE_PROVIDER_SITE_OTHER): Payer: BLUE CROSS/BLUE SHIELD | Admitting: Internal Medicine

## 2014-12-27 ENCOUNTER — Encounter: Payer: Self-pay | Admitting: Internal Medicine

## 2014-12-27 VITALS — BP 128/80 | HR 86 | Temp 98.0°F | Resp 16 | Ht 62.5 in | Wt 195.0 lb

## 2014-12-27 DIAGNOSIS — J069 Acute upper respiratory infection, unspecified: Secondary | ICD-10-CM

## 2014-12-27 MED ORDER — AZITHROMYCIN 250 MG PO TABS: ORAL_TABLET | ORAL | Status: DC

## 2014-12-27 MED ORDER — PROMETHAZINE-DM 6.25-15 MG/5ML PO SYRP: ORAL_SOLUTION | ORAL | Status: DC

## 2014-12-27 MED ORDER — PREDNISONE 20 MG PO TABS: ORAL_TABLET | ORAL | Status: DC

## 2014-12-27 NOTE — Patient Instructions (Signed)
Please start using flonase.  Please do 1 spray per nostril in the morning and evening.  Please start taking zyrtec nightly.   Please use saline in your nose daily as often as you can.  Please take the prednisone until it is gone.  Please hold the zpak for 4-5 days and if things change or get worse take it sooner.

## 2014-12-27 NOTE — Progress Notes (Signed)
Patient ID: Natalie Baker, female   DOB: 09/26/1955, 59 y.o.   MRN: [id number]  HPI  Patient presents to the office for evaluation of cough.  It has been going on for 4 days.  Patient reports day > night, dry, with occasional sputum production that is similar to nasal sputum.  They also endorse change in voice, chills, postnasal drip, sputum production, wheezing and sinus pressure, nasal congestion, sore throat, sore glands.  .  They have tried tessalon, mucinex, advil.  They report that nothing has worked.  They admits to other sick contacts.  People at work are having very similar symptoms.  She did not get a flu shot.  Review of Systems  Constitutional: Positive for chills. Negative for fever and malaise/fatigue.  HENT: Positive for congestion and sore throat. Negative for ear pain.   Eyes: Negative.   Respiratory: Positive for cough, sputum production and wheezing. Negative for shortness of breath.   Cardiovascular: Negative for chest pain and palpitations.  Neurological: Positive for headaches.    PE:  Filed Vitals:   12/27/14 1405  BP: 128/80  Pulse: 86  Temp: 98 F (36.7 C)  Resp: 16   General:  Alert and non-toxic, WDWN, NAD HEENT: NCAT, PERLA, EOM normal, no occular discharge or erythema.  Nasal mucosal edema with sinus tenderness to palpation.  Oropharynx clear with minimal oropharyngeal edema and erythema.  Mucous membranes moist and pink. Neck:  Cervical adenopathy Chest:  RRR no MRGs.  Lungs clear to auscultation A&P with no wheezes rhonchi or rales.   Abdomen: +BS x 4 quadrants, soft, non-tender, no guarding, rigidity, or rebound. Skin: warm and dry no rash Neuro: A&Ox4, CN II-XII grossly intact  Assessment and Plan:   1. Acute URI -flonase -saline -zyrtec -phenergan dextromethorphan -zpak -prednisone

## 2015-01-17 ENCOUNTER — Other Ambulatory Visit: Payer: Self-pay | Admitting: Gynecology

## 2015-01-17 DIAGNOSIS — R928 Other abnormal and inconclusive findings on diagnostic imaging of breast: Secondary | ICD-10-CM

## 2015-01-18 ENCOUNTER — Encounter: Payer: Self-pay | Admitting: Internal Medicine

## 2015-01-18 ENCOUNTER — Ambulatory Visit (INDEPENDENT_AMBULATORY_CARE_PROVIDER_SITE_OTHER): Payer: BLUE CROSS/BLUE SHIELD | Admitting: Internal Medicine

## 2015-01-18 VITALS — BP 122/80 | HR 82 | Temp 98.2°F | Resp 18 | Ht 62.25 in | Wt 195.0 lb

## 2015-01-18 DIAGNOSIS — E559 Vitamin D deficiency, unspecified: Secondary | ICD-10-CM | POA: Diagnosis not present

## 2015-01-18 DIAGNOSIS — Z79899 Other long term (current) drug therapy: Secondary | ICD-10-CM

## 2015-01-18 DIAGNOSIS — E785 Hyperlipidemia, unspecified: Secondary | ICD-10-CM | POA: Diagnosis not present

## 2015-01-18 DIAGNOSIS — R002 Palpitations: Secondary | ICD-10-CM

## 2015-01-18 DIAGNOSIS — H04123 Dry eye syndrome of bilateral lacrimal glands: Secondary | ICD-10-CM | POA: Diagnosis not present

## 2015-01-18 DIAGNOSIS — E039 Hypothyroidism, unspecified: Secondary | ICD-10-CM

## 2015-01-18 LAB — HEPATIC FUNCTION PANEL
ALBUMIN: 4.1 g/dL (ref 3.6–5.1)
ALK PHOS: 69 U/L (ref 33–130)
ALT: 17 U/L (ref 6–29)
AST: 17 U/L (ref 10–35)
Bilirubin, Direct: 0.1 mg/dL (ref <=0.2)
Indirect Bilirubin: 0.3 mg/dL (ref 0.2–1.2)
TOTAL PROTEIN: 6.5 g/dL (ref 6.1–8.1)
Total Bilirubin: 0.4 mg/dL (ref 0.2–1.2)

## 2015-01-18 LAB — CBC WITH DIFFERENTIAL/PLATELET
BASOS PCT: 1 % (ref 0–1)
Basophils Absolute: 0.1 10*3/uL (ref 0.0–0.1)
EOS ABS: 0.1 10*3/uL (ref 0.0–0.7)
Eosinophils Relative: 2 % (ref 0–5)
HCT: 42.4 % (ref 36.0–46.0)
HEMOGLOBIN: 14.1 g/dL (ref 12.0–15.0)
LYMPHS ABS: 2.1 10*3/uL (ref 0.7–4.0)
Lymphocytes Relative: 35 % (ref 12–46)
MCH: 27.4 pg (ref 26.0–34.0)
MCHC: 33.3 g/dL (ref 30.0–36.0)
MCV: 82.3 fL (ref 78.0–100.0)
MONO ABS: 0.5 10*3/uL (ref 0.1–1.0)
MONOS PCT: 8 % (ref 3–12)
MPV: 9.8 fL (ref 8.6–12.4)
NEUTROS ABS: 3.3 10*3/uL (ref 1.7–7.7)
NEUTROS PCT: 54 % (ref 43–77)
Platelets: 273 10*3/uL (ref 150–400)
RBC: 5.15 MIL/uL — ABNORMAL HIGH (ref 3.87–5.11)
RDW: 14.6 % (ref 11.5–15.5)
WBC: 6.1 10*3/uL (ref 4.0–10.5)

## 2015-01-18 LAB — TSH: TSH: 0.338 u[IU]/mL — AB (ref 0.350–4.500)

## 2015-01-18 LAB — BASIC METABOLIC PANEL WITH GFR
BUN: 11 mg/dL (ref 7–25)
CHLORIDE: 101 mmol/L (ref 98–110)
CO2: 28 mmol/L (ref 20–31)
CREATININE: 0.72 mg/dL (ref 0.50–1.05)
Calcium: 9.5 mg/dL (ref 8.6–10.4)
GFR, Est Non African American: >89 mL/min (ref >=60)
GLUCOSE: 84 mg/dL (ref 65–99)
POTASSIUM: 4.5 mmol/L (ref 3.5–5.3)
Sodium: 139 mmol/L (ref 135–146)

## 2015-01-18 LAB — LIPID PANEL
CHOLESTEROL: 209 mg/dL — AB (ref 125–200)
HDL: 53 mg/dL (ref >=46)
LDL Cholesterol: 125 mg/dL (ref <130)
Total CHOL/HDL Ratio: 3.9 Ratio (ref <=5.0)
Triglycerides: 156 mg/dL — ABNORMAL HIGH (ref <150)
VLDL: 31 mg/dL — ABNORMAL HIGH (ref <30)

## 2015-01-18 NOTE — Progress Notes (Signed)
Patient ID: Natalie Baker, female   DOB: [birth year], 60 y.o.   MRN: [id number]  Assessment and Plan:  Hypertension:  -Continue medication -monitor blood pressure at home. -Continue DASH diet -Reminder to go to the ER if any CP, SOB, nausea, dizziness, severe HA, changes vision/speech, left arm numbness and tingling and jaw pain.  Cholesterol - Continue diet and exercise -Check cholesterol.   PreDiabetes without complications -Continue diet and exercise.  -Check A1C  Vitamin D Def -check level -continue medications.   Dry Eyes and need for Eye visit -referral to Dr. Delman Cheadle  Abnormal Mammogram -recommended sending over report to office after visit on Friday    Continue diet and meds as discussed. Further disposition pending results of labs. Discussed med's effects and SE's.    HPI 60 y.o. female  presents for 3 month follow up with hypertension, hyperlipidemia, diabetes and vitamin D deficiency.   Her blood pressure has been controlled at home, today their BP is BP: 122/80 mmHg.She does workout. She denies chest pain, shortness of breath, dizziness.  She reports that she is walking every now and then.     She is on cholesterol medication and denies myalgias. Her cholesterol is at goal. The cholesterol was:  09/26/2014: Cholesterol 203*; HDL 50; LDL Cholesterol 113; Triglycerides 199*   She has been working on diet and exercise for diabetes without complications, she is on bASA, she is on ACE/ARB, and denies  foot ulcerations, hyperglycemia, hypoglycemia , increased appetite, nausea, paresthesia of the feet, polydipsia, polyuria, visual disturbances, vomiting and weight loss. Last A1C was: 09/26/2014: Hgb A1c MFr Bld 5.9*   Patient is on Vitamin D supplement. 09/26/2014: Vit D, 25-Hydroxy 22*   She is supposed to be going back to the breast center on Friday because she had something that was visible on her mammogram.  She is unsure what they are going to do.      Current  Medications:  Current Outpatient Prescriptions on File Prior to Visit  Medication Sig Dispense Refill  . b complex vitamins tablet Take 1 tablet by mouth daily.    . calcium-vitamin D (OSCAL WITH D) 250-125 MG-UNIT per tablet Take 1 tablet by mouth daily.    . Coenzyme Q10 (COQ10 PO) Take 1 tablet by mouth 2 (two) times daily.    Marland Kitchen estradiol (VIVELLE-DOT) 0.075 MG/24HR Place 1 patch onto the skin 2 (two) times a week.    . Flaxseed, Linseed, (FLAX SEED OIL PO) Take 2 capsules by mouth 2 (two) times daily.    . fluticasone (FLONASE) 50 MCG/ACT nasal spray Place 2 sprays into both nostrils daily. 16 g 0  . hyoscyamine (LEVSIN SL) 0.125 MG SL tablet Place 1 tablet (0.125 mg total) under the tongue every 4 (four) hours as needed for cramping (diarrhea). 60 tablet 1  . IRON PO Take 1 tablet by mouth daily.    Marland Kitchen levothyroxine (SYNTHROID, LEVOTHROID) 112 MCG tablet TAKE 1 TABLET ON EMPTY STOMACH WITH SIP OF WATER IN AM.WAIT 30MINS TO AN HOUR BEFORE EATING 90 tablet 1  . MAGNESIUM PO Take 1 tablet by mouth daily.    . Multiple Vitamin (MULTIVITAMIN) capsule Take 1 capsule by mouth daily.    . Omega-3 Fatty Acids (FISH OIL PO) Take 2 capsules by mouth 2 (two) times daily.    . Red Yeast Rice Extract (RED YEAST RICE PO) Take 2 tablets by mouth 2 (two) times daily.     No current facility-administered medications on file prior to  visit.   Medical History:  Past Medical History  Diagnosis Date  . Hyperlipidemia   . Vitamin D deficiency   . Osteoporosis   . Hypothyroidism   . Palpitations   . Colon polyp 12/29/2006  . Obesity     BMI 34   Allergies:  Allergies  Allergen Reactions  . Ppd [Tuberculin Purified Protein Derivative]     Positive reaction.  Negative Chest Xray 06/16/12     Review of Systems:  Review of Systems  Constitutional: Negative for fever, chills and malaise/fatigue.  HENT: Negative for congestion, ear pain and sore throat.   Eyes: Negative.   Respiratory: Negative for  cough, shortness of breath and wheezing.   Cardiovascular: Negative for chest pain, palpitations and PND.  Gastrointestinal: Negative for diarrhea, constipation, blood in stool and melena.  Genitourinary: Negative.   Neurological: Negative for dizziness, sensory change, loss of consciousness and headaches.  Psychiatric/Behavioral: Negative for depression and suicidal ideas. The patient does not have insomnia.     Family history- Review and unchanged  Social history- Review and unchanged  Physical Exam: BP 122/80 mmHg  Pulse 82  Temp(Src) 98.2 F (36.8 C) (Temporal)  Resp 18  Ht 5' 2.25" (1.581 m)  Wt 195 lb (88.451 kg)  BMI 35.39 kg/m2 Wt Readings from Last 3 Encounters:  01/18/15 195 lb (88.451 kg)  12/27/14 195 lb (88.451 kg)  09/26/14 199 lb (90.266 kg)   General Appearance: Well nourished well developed, non-toxic appearing, in no apparent distress. Eyes: PERRLA, EOMs, conjunctiva no swelling or erythema ENT/Mouth: Ear canals clear with no erythema, swelling, or discharge.  TMs normal bilaterally, oropharynx clear, moist, with no exudate.   Neck: Supple, thyroid normal, no JVD, no cervical adenopathy.  Respiratory: Respiratory effort normal, breath sounds clear A&P, no wheeze, rhonchi or rales noted.  No retractions, no accessory muscle usage Cardio: RRR with no MRGs. No noted edema.  Abdomen: Soft, + BS.  Non tender, no guarding, rebound, hernias, masses. Musculoskeletal: Full ROM, 5/5 strength, Normal gait Skin: Warm, dry without rashes, lesions, ecchymosis.  Neuro: Awake and oriented X 3, Cranial nerves intact. No cerebellar symptoms.  Psych: normal affect, Insight and Judgment appropriate.    Starlyn Skeans, PA-C 9:05 AM [facility]

## 2015-01-20 ENCOUNTER — Ambulatory Visit
Admission: RE | Admit: 2015-01-20 | Discharge: 2015-01-20 | Disposition: A | Discharge status: Home / Self Care | Payer: BLUE CROSS/BLUE SHIELD | Source: Ambulatory Visit | Attending: Gynecology | Admitting: Gynecology

## 2015-01-20 DIAGNOSIS — R928 Other abnormal and inconclusive findings on diagnostic imaging of breast: Secondary | ICD-10-CM

## 2015-04-10 ENCOUNTER — Encounter: Payer: Self-pay | Admitting: Internal Medicine

## 2015-04-10 ENCOUNTER — Ambulatory Visit (INDEPENDENT_AMBULATORY_CARE_PROVIDER_SITE_OTHER): Payer: BLUE CROSS/BLUE SHIELD | Admitting: Internal Medicine

## 2015-04-10 VITALS — BP 120/82 | HR 74 | Temp 98.0°F | Resp 16 | Ht 62.25 in | Wt 198.0 lb

## 2015-04-10 DIAGNOSIS — M7522 Bicipital tendinitis, left shoulder: Secondary | ICD-10-CM

## 2015-04-10 MED ORDER — METAXALONE 800 MG PO TABS: 800.0000 mg | ORAL_TABLET | Freq: Three times a day (TID) | ORAL | Status: DC

## 2015-04-10 MED ORDER — PREDNISONE 20 MG PO TABS: ORAL_TABLET | ORAL | Status: DC

## 2015-04-10 MED ORDER — MELOXICAM 15 MG PO TABS: 15.0000 mg | ORAL_TABLET | Freq: Every day | ORAL | Status: DC

## 2015-04-10 NOTE — Progress Notes (Signed)
   Subjective:    Patient ID: Natalie Baker, female    DOB: [birth year], 60 y.o.   MRN: [id number]  Arm Pain   Patient presents to the office for evaluation of left shoulder pain x 3 days.  She reports that the pain is mostly in her biceps tendon.  She feels like it is a bruise type of pain.  She has tried heat, ibuprofen, and salonpas.  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  She reports that her pain is moderate to severe.  She reports that pain shoots form her elbow to her shoulder.  Aggravating factor include moving the arm.    Review of Systems  Constitutional: Negative for fever, chills and fatigue.  Gastrointestinal: Negative for nausea and vomiting.  Musculoskeletal: Positive for arthralgias. Negative for joint swelling.  Skin: Negative for color change and rash.  Neurological: Negative for dizziness, speech difficulty, weakness and light-headedness.       Objective:   Physical Exam  Constitutional: She is oriented to person, place, and time. She appears well-developed and well-nourished. No distress.  HENT:  Head: Normocephalic.  Mouth/Throat: Oropharynx is clear and moist. No oropharyngeal exudate.  Eyes: Conjunctivae are normal. No scleral icterus.  Neck: Normal range of motion. Neck supple. No JVD present. No thyromegaly present.  Cardiovascular: Normal rate, regular rhythm, normal heart sounds and intact distal pulses.  Exam reveals no gallop and no friction rub.   No murmur heard. Pulmonary/Chest: Effort normal and breath sounds normal. No respiratory distress. She has no wheezes. She has no rales. She exhibits no tenderness.  Musculoskeletal: Normal range of motion.       Left shoulder: She exhibits tenderness and pain. She exhibits normal range of motion, no bony tenderness, no swelling, no effusion, no crepitus, no deformity, no laceration, no spasm, normal pulse and normal strength.  Tenderness to palpation of the left biceps  tendonitis.  Positive speeds test.  Negative empty can, negative hawkins kennedy, negative lift test, negative belly press.  Normal 5/5 strength in all fields of motion. Arm neurovascularly intact   Lymphadenopathy:    She has no cervical adenopathy.  Neurological: She is alert and oriented to person, place, and time.  Skin: Skin is warm and dry. She is not diaphoretic.  Psychiatric: She has a normal mood and affect. Her behavior is normal. Judgment and thought content normal.  Nursing note and vitals reviewed.   Filed Vitals:   04/10/15 1127  BP: 120/82  Pulse: 74  Temp: 98 F (36.7 C)  Resp: 16         Assessment & Plan:    1. Biceps tendonitis, left -if no relief will send to ortho - metaxalone (SKELAXIN) 800 MG tablet; Take 1 tablet (800 mg total) by mouth 3 (three) times daily.  Dispense: 30 tablet; Refill: 1 - predniSONE (DELTASONE) 20 MG tablet; 3 tabs po day one, then 2 tabs daily x 4 days  Dispense: 11 tablet; Refill: 0 - meloxicam (MOBIC) 15 MG tablet; Take 1 tablet (15 mg total) by mouth daily.  Dispense: 30 tablet; Refill: 2

## 2015-04-10 NOTE — Patient Instructions (Signed)
Biceps Tendon Tendinitis (Distal) With Rehab Tendinitis involves inflammation and pain over the affected tendon. The distal biceps tendon (near the elbow) is vulnerable to tendinitis. Distal biceps tendonitis is usually due to the bony bump near the elbow (bicipital tuberosity) causing increased friction over the tendon. The biceps tendon attaches the biceps muscle to one bone in the elbow and two in the shoulder. It is important for proper function of the elbow and for turning the palm upward (supination). SYMPTOMS   Pain, aching, tenderness, and sometimes warmth or redness over the front of the elbow.  Pain when bending the elbow or turning the palm up, using the wrist, especially if performed against resistance.  Crackling sound (crepitation) when the tendon or elbow is moved or touched. CAUSES  The symptoms of biceps tendonitis are due to inflammation of the tendon. Inflammation may be caused by:  Strain from sudden increase in amount or intensity of activity.  Direct blow or injury to the elbow (uncommon).  Overuse or repetitive elbow bending or wrist rotation, particularly when turning the palm up, or with elbow hyperextension. RISK INCREASES WITH:  Sports that involve contact or overhead arm activity (throwing sports, gymnastics, weightlifting, bodybuilding, rock climbing).  Heavy labor.  Poor strength and flexibility.  Failure to warm up properly before activity.  Injury to other structures of the elbow.  Restraint of the elbow. PREVENTION  Warm up and stretch properly before activity.  Allow time for recovery between activities.  Maintain physical fitness:  Strength, flexibility, and endurance.  Cardiovascular fitness.  Learn and use proper exercise technique. PROGNOSIS  With proper treatment, biceps tendon tendonitis is usually curable within 6 weeks.  RELATED COMPLICATIONS   Longer healing time if not properly treated or if not given enough time to  heal.  Chronically inflamed tendon that causes persistent pain with activity, that may progress to constant pain and potentially rupture of the tendon.  Recurring symptoms, especially if activity is resumed too soon, with overuse or with poor technique. TREATMENT  Treatment first involves ice and medicine to reduce pain and inflammation. Modify activities that cause pain, to reduce the chances of causing the condition to get worse. Strengthening and stretching exercises should be performed to promote proper use of the muscles of the elbow. These exercise may be performed at home or with a therapist. Other treatments may be given such as ultrasound or heat therapy. Surgery is usually not recommended.  MEDICATION  If pain medicine is needed, nonsteroidal anti-inflammatory medicines (aspirin and ibuprofen), or other minor pain relievers (acetaminophen), are often advised.  Do not take pain relieving medication for 7 days before surgery.  Prescription pain relievers may be given if your caregiver thinks they are needed. Use only as directed and only as much as you need. HEAT AND COLD:   Cold treatment (icing) should be applied for 10 to 15 minutes every 2 to 3 hours for inflammation and pain, and immediately after activity that aggravates your symptoms. Use ice packs or an ice massage.  Heat treatment may be used before performing stretching and strengthening activities prescribed by your caregiver, physical therapist, or athletic trainer. Use a heat pack or a warm water soak. SEEK MEDICAL CARE IF:   Symptoms get worse or do not improve in 2 weeks, despite treatment.  New, unexplained symptoms develop. (Drugs used in treatment may produce side effects.) EXERCISES  RANGE OF MOTION (ROM) AND STRETCHING EXERCISES - Biceps Tendon Tendinitis (Distal) These exercises may help you when beginning to   rehabilitate your injury. Your symptoms may go away with or without further involvement from your  physician, physical therapist, or athletic trainer. While completing these exercises, remember:   Restoring tissue flexibility helps normal motion to return to the joints. This allows healthier, less painful movement and activity.  An effective stretch should be held for at least 30 seconds.  A stretch should never be painful. You should only feel a gentle lengthening or release in the stretched tissue. STRETCH - Elbow Flexors   Lie on a firm bed or countertop on your back. Be sure that you are in a comfortable position which will allow you to relax your arm muscles.  Place a folded towel under your right / left upper arm, so that your elbow and shoulder are at the same height. Extend your arm; your elbow should not rest on the bed or towel.  Allow the weight of your hand to straighten your elbow. Keep your arm and chest muscles relaxed. Your caretaker may ask you to increase the intensity of your stretch by adding a small wrist or hand weight.  Hold for __________ seconds. You should feel a stretch on the inside of your elbow. Slowly return to the starting position. Repeat __________ times. Complete this exercise __________ times per day. RANGE OF MOTION - Supination, Active   Stand or sit with your elbows at your side. Bend your right / left elbow to 90 degrees.  Turn your palm upward until you feel a gentle stretch on the inside of your forearm.  Hold this position for __________ seconds. Slowly release and return to the starting position. Repeat __________ times. Complete this stretch __________ times per day.  RANGE OF MOTION - Pronation, Active   Stand or sit with your elbows at your side. Bend your right / left elbow to 90 degrees.  Turn your palm downward until you feel a gentle stretch on the top of your forearm.  Hold this position for __________ seconds. Slowly release and return to the starting position. Repeat __________ times. Complete this stretch __________ times per  day.  STRENGTHENING EXERCISES - Biceps Tendon Tendinitis (Distal) These exercises may help you when beginning to rehabilitate your injury. They may resolve your symptoms with or without further involvement from your physician, physical therapist or athletic trainer. While completing these exercises, remember:   Muscles can gain both the endurance and the strength needed for everyday activities through controlled exercises.  Complete these exercises as instructed by your physician, physical therapist or athletic trainer. Increase the resistance and repetitions only as guided.  You may experience muscle soreness or fatigue, but the pain or discomfort you are trying to eliminate should never get worse during these exercises. If this pain does get worse, stop and make sure you are following the directions exactly. If the pain is still present after adjustments, discontinue the exercise until you can discuss the trouble with your clinician. STRENGTH - Elbow Flexors, Isometric   Stand or sit upright on a firm surface. Place your right / left arm so that your hand is palm-up and at the height of your waist.  Place your opposite hand on top of your forearm. Gently push down as your right / left arm resists. Push as hard as you can with both arms without causing any pain or movement at your right / left elbow. Hold this stationary position for __________ seconds.  Gradually release the tension in both arms. Allow your muscles to relax completely before repeating. Repeat  __________ times. Complete this exercise __________ times per day. STRENGTH - Forearm Supinators   Sit with your right / left forearm supported on a table, keeping your elbow below shoulder height. Rest your hand over the edge, palm down.  Gently grip a hammer or a soup ladle.  Without moving your elbow, slowly turn your palm and hand upward to a "thumbs-up" position.  Hold this position for __________ seconds. Slowly return to the  starting position. Repeat __________ times. Complete this exercise __________ times per day.  STRENGTH - Forearm Pronators   Sit with your right / left forearm supported on a table, keeping your elbow below shoulder height. Rest your hand over the edge, palm up.  Gently grip a hammer or a soup ladle.  Without moving your elbow, slowly turn your palm and hand upward to a "thumbs-up" position.  Hold this position for __________ seconds. Slowly return to the starting position. Repeat __________ times. Complete this exercise __________ times per day.  STRENGTH - Elbow Flexors, Supinated  With good posture, stand or sit on a firm chair without armrests. Allow your right / left arm to rest at your side with your palm facing forward.  Holding a __________ weight, or gripping a rubber exercise band or tubing, bring your hand toward your shoulder.  Allow your muscles to control the resistance as your hand returns to your side. Repeat __________ times. Complete this exercise __________ times per day.  STRENGTH - Elbow Flexors, Neutral  With good posture, stand or sit on a firm chair without armrests. Allow your right / left arm to rest at your side with your thumb facing forward.  Holding a __________ weight, or gripping a rubber exercise band or tubing, bring your hand toward your shoulder.  Allow your muscles to control the resistance as your hand returns to your side. Repeat __________ times. Complete this exercise __________ times per day.    This information is not intended to replace advice given to you by your health care provider. Make sure you discuss any questions you have with your health care provider.   Document Released: 12/24/2004 Document Revised: 01/14/2014 Document Reviewed: 04/07/2008 Elsevier Interactive Patient Education 2016 [employer]  

## 2015-04-11 DIAGNOSIS — N6012 Diffuse cystic mastopathy of left breast: Secondary | ICD-10-CM | POA: Diagnosis not present

## 2015-04-11 DIAGNOSIS — N644 Mastodynia: Secondary | ICD-10-CM | POA: Diagnosis not present

## 2015-04-20 ENCOUNTER — Encounter: Payer: Self-pay | Admitting: Physician Assistant

## 2015-04-20 ENCOUNTER — Ambulatory Visit (INDEPENDENT_AMBULATORY_CARE_PROVIDER_SITE_OTHER): Payer: BLUE CROSS/BLUE SHIELD | Admitting: Physician Assistant

## 2015-04-20 VITALS — BP 124/68 | HR 82 | Temp 97.3°F | Resp 14 | Ht 62.0 in | Wt 196.0 lb

## 2015-04-20 DIAGNOSIS — E669 Obesity, unspecified: Secondary | ICD-10-CM

## 2015-04-20 DIAGNOSIS — M79602 Pain in left arm: Secondary | ICD-10-CM | POA: Diagnosis not present

## 2015-04-20 DIAGNOSIS — E785 Hyperlipidemia, unspecified: Secondary | ICD-10-CM

## 2015-04-20 DIAGNOSIS — R030 Elevated blood-pressure reading, without diagnosis of hypertension: Secondary | ICD-10-CM | POA: Diagnosis not present

## 2015-04-20 DIAGNOSIS — R7309 Other abnormal glucose: Secondary | ICD-10-CM | POA: Diagnosis not present

## 2015-04-20 DIAGNOSIS — Z79899 Other long term (current) drug therapy: Secondary | ICD-10-CM | POA: Diagnosis not present

## 2015-04-20 DIAGNOSIS — IMO0001 Reserved for inherently not codable concepts without codable children: Secondary | ICD-10-CM

## 2015-04-20 DIAGNOSIS — E039 Hypothyroidism, unspecified: Secondary | ICD-10-CM | POA: Diagnosis not present

## 2015-04-20 DIAGNOSIS — E559 Vitamin D deficiency, unspecified: Secondary | ICD-10-CM

## 2015-04-20 LAB — CBC WITH DIFFERENTIAL/PLATELET
BASOS ABS: 0 {cells}/uL (ref 0–200)
Basophils Relative: 0 %
EOS ABS: 77 {cells}/uL (ref 15–500)
Eosinophils Relative: 1 %
HEMATOCRIT: 37.2 % (ref 35.0–45.0)
Hemoglobin: 12 g/dL (ref 11.7–15.5)
LYMPHS PCT: 36 %
Lymphs Abs: 2772 cells/uL (ref 850–3900)
MCH: 27.1 pg (ref 27.0–33.0)
MCHC: 32.3 g/dL (ref 32.0–36.0)
MCV: 84 fL (ref 80.0–100.0)
MONO ABS: 539 {cells}/uL (ref 200–950)
MONOS PCT: 7 %
MPV: 9.7 fL (ref 7.5–12.5)
Neutro Abs: 4312 cells/uL (ref 1500–7800)
Neutrophils Relative %: 56 %
PLATELETS: 321 10*3/uL (ref 140–400)
RBC: 4.43 MIL/uL (ref 3.80–5.10)
RDW: 14.5 % (ref 11.0–15.0)
WBC: 7.7 10*3/uL (ref 3.8–10.8)

## 2015-04-20 LAB — BASIC METABOLIC PANEL WITH GFR
BUN: 10 mg/dL (ref 7–25)
CHLORIDE: 102 mmol/L (ref 98–110)
CO2: 29 mmol/L (ref 20–31)
Calcium: 9.5 mg/dL (ref 8.6–10.4)
Creat: 0.73 mg/dL (ref 0.50–1.05)
GFR, Est Non African American: >89 mL/min (ref >=60)
GLUCOSE: 90 mg/dL (ref 65–99)
POTASSIUM: 4.1 mmol/L (ref 3.5–5.3)
Sodium: 140 mmol/L (ref 135–146)

## 2015-04-20 LAB — LIPID PANEL
Cholesterol: 193 mg/dL (ref 125–200)
HDL: 51 mg/dL (ref >=46)
LDL CALC: 102 mg/dL (ref <130)
TRIGLYCERIDES: 201 mg/dL — AB (ref <150)
Total CHOL/HDL Ratio: 3.8 Ratio (ref <=5.0)
VLDL: 40 mg/dL — AB (ref <30)

## 2015-04-20 LAB — HEPATIC FUNCTION PANEL
ALK PHOS: 65 U/L (ref 33–130)
ALT: 15 U/L (ref 6–29)
AST: 15 U/L (ref 10–35)
Albumin: 4.1 g/dL (ref 3.6–5.1)
BILIRUBIN INDIRECT: 0.2 mg/dL (ref 0.2–1.2)
Bilirubin, Direct: 0.1 mg/dL (ref <=0.2)
TOTAL PROTEIN: 6.2 g/dL (ref 6.1–8.1)
Total Bilirubin: 0.3 mg/dL (ref 0.2–1.2)

## 2015-04-20 LAB — MAGNESIUM: Magnesium: 1.8 mg/dL (ref 1.5–2.5)

## 2015-04-20 LAB — TSH: TSH: 0.71 mIU/L

## 2015-04-20 NOTE — Patient Instructions (Addendum)
Add ENTERIC COATED low dose 81 mg Aspirin daily OR can do every other day if you have easy bruising to protect your heart and head. As well as to reduce risk of Colon Cancer by 20 %, Skin Cancer by 26 % , Melanoma by 46% and Pancreatic cancer by 60%  I think it is possible that you have sleep apnea. It can cause interrupted sleep, headaches, frequent awakenings, fatigue, dry mouth, fast/slow heart beats, memory issues, anxiety/depression, swelling, numbness tingling hands/feet, weight gain, shortness of breath, and the list goes on. Sleep apnea needs to be ruled out because if it is left untreated it does eventually lead to abnormal heart beats, lung failure or heart failure as well as increasing the risk of heart attack and stroke. There are masks you can wear OR a mouth piece that I can give you information about. Often times though people feel MUCH better after getting treatment.   Sleep Apnea  Sleep apnea is a sleep disorder characterized by abnormal pauses in breathing while you sleep. When your breathing pauses, the level of oxygen in your blood decreases. This causes you to move out of deep sleep and into light sleep. As a result, your quality of sleep is poor, and the system that carries your blood throughout your body (cardiovascular system) experiences stress. If sleep apnea remains untreated, the following conditions can develop:  High blood pressure (hypertension).  Coronary artery disease.  Inability to achieve or maintain an erection (impotence).  Impairment of your thought process (cognitive dysfunction). There are three types of sleep apnea: 1. Obstructive sleep apnea--Pauses in breathing during sleep because of a blocked airway. 2. Central sleep apnea--Pauses in breathing during sleep because the area of the brain that controls your breathing does not send the correct signals to the muscles that control breathing. 3. Mixed sleep apnea--A combination of both obstructive and central  sleep apnea.  RISK FACTORS The following risk factors can increase your risk of developing sleep apnea:  Being overweight.  Smoking.  Having narrow passages in your nose and throat.  Being of older age.  Being female.  Alcohol use.  Sedative and tranquilizer use.  Ethnicity. Among individuals younger than 35 years, African Americans are at increased risk of sleep apnea. SYMPTOMS   Difficulty staying asleep.  Daytime sleepiness and fatigue.  Loss of energy.  Irritability.  Loud, heavy snoring.  Morning headaches.  Trouble concentrating.  Forgetfulness.  Decreased interest in sex. DIAGNOSIS  In order to diagnose sleep apnea, your caregiver will perform a physical examination. Your caregiver may suggest that you take a home sleep test. Your caregiver may also recommend that you spend the night in a sleep lab. In the sleep lab, several monitors record information about your heart, lungs, and brain while you sleep. Your leg and arm movements and blood oxygen level are also recorded. TREATMENT The following actions may help to resolve mild sleep apnea:  Sleeping on your side.   Using a decongestant if you have nasal congestion.   Avoiding the use of depressants, including alcohol, sedatives, and narcotics.   Losing weight and modifying your diet if you are overweight. There also are devices and treatments to help open your airway:  Oral appliances. These are custom-made mouthpieces that shift your lower jaw forward and slightly open your bite. This opens your airway.  Devices that create positive airway pressure. This positive pressure "splints" your airway open to help you breathe better during sleep. The following devices create positive airway  pressure:  Continuous positive airway pressure (CPAP) device. The CPAP device creates a continuous level of air pressure with an air pump. The air is delivered to your airway through a mask while you sleep. This continuous  pressure keeps your airway open.  Nasal expiratory positive airway pressure (EPAP) device. The EPAP device creates positive air pressure as you exhale. The device consists of single-use valves, which are inserted into each nostril and held in place by adhesive. The valves create very little resistance when you inhale but create much more resistance when you exhale. That increased resistance creates the positive airway pressure. This positive pressure while you exhale keeps your airway open, making it easier to breath when you inhale again.  Bilevel positive airway pressure (BPAP) device. The BPAP device is used mainly in patients with central sleep apnea. This device is similar to the CPAP device because it also uses an air pump to deliver continuous air pressure through a mask. However, with the BPAP machine, the pressure is set at two different levels. The pressure when you exhale is lower than the pressure when you inhale.  Surgery. Typically, surgery is only done if you cannot comply with less invasive treatments or if the less invasive treatments do not improve your condition. Surgery involves removing excess tissue in your airway to create a wider passage way. Document Released: 12/14/2001 Document Revised: 04/20/2012 Document Reviewed: 05/02/2011 [facility] Patient Information 2015 [place], [place]. This information is not intended to replace advice given to you by your health care provider. Make sure you discuss any questions you have with your health care provider.  100 oz of water    We want weight loss that will last so you should lose 1-2 pounds a week.  THAT IS IT! Please pick THREE things a month to change. Once it is a habit check off the item. Then pick another three items off the list to become habits.  If you are already doing a habit on the list GREAT!  Cross that item off! o Don't drink your calories. Ie, alcohol, soda, fruit juice, and sweet tea.  o Drink more water. Drink a glass  when you feel hungry or before each meal.  o Eat breakfast - Complex carb and protein (likeDannon light and fit yogurt, oatmeal, fruit, eggs, [place] bacon). o Measure your cereal.  Eat no more than one cup a day. (ie [place]) o Eat an apple a day. o Add a vegetable a day. o Try a new vegetable a month. o Use Pam! Stop using oil or butter to cook. o Don't finish your plate or use smaller plates. o Share your dessert. o Eat sugar free Jello for dessert or frozen grapes. o Don't eat 2-3 hours before bed. o Switch to whole wheat bread, pasta, and brown rice. o Make healthier choices when you eat out. No fries! o Pick baked chicken, NOT fried. o Don't forget to SLOW DOWN when you eat. It is not going anywhere.  o Take the stairs. o Park far away in the parking lot o [employer] (or weights) for 10 minutes while watching TV. o Walk at work for 10 minutes during break. o Walk outside 1 time a week with your friend, kids, dog, or significant other. o Start a walking group at [place] the mall as much as you can tolerate.  o Keep a food diary. o Weigh yourself daily. o Walk for 15 minutes 3 days per week. Leslie Dales at home more  often and eat out less.  If life happens and you go back to old habits, it is okay.  Just start over. You can do it!   If you experience chest pain, get short of breath, or tired during the exercise, please stop immediately and inform your doctor.      Bad carbs also include fruit juice, alcohol, and sweet tea. These are empty calories that do not signal to your brain that you are full.   Please remember the good carbs are still carbs which convert into sugar. So please measure them out no more than 1/2-1 cup of rice, oatmeal, pasta, and beans  Veggies are however free foods! Pile them on.   Not all fruit is created equal. Please see the list below, the fruit at the bottom is higher in sugars than the fruit at the top. Please avoid all dried fruits.

## 2015-04-20 NOTE — Progress Notes (Signed)
Patient ID: Natalie Baker, female   DOB: 1955/02/15, 60 y.o.   MRN: [id number]  Assessment and Plan:  Hypertension:  -Continue medication -monitor blood pressure at home. -Continue DASH diet -Reminder to go to the ER if any CP, SOB, nausea, dizziness, severe HA, changes vision/speech, left arm numbness and tingling and jaw pain.  Cholesterol - Continue diet and exercise -Check cholesterol.   PreDiabetes without complications -Continue diet and exercise.  -Check A1C  Vitamin D Def -check level -continue medications.   Obesity with co morbidities - long discussion about weight loss, diet, and exercise - discussed possible sleep apnea - will find out which nutrition she wants to go see  Hypothyroidism -check TSH level, continue medications the same, reminded to take on an empty stomach 30-25mins before food.   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 Further disposition pending results of labs. Discussed med's effects and SE's.    HPI 60 y.o. female  presents for 3 month follow up with hypertension, hyperlipidemia, diabetes and vitamin D deficiency.   Her blood pressure has been controlled at home, today their BP is BP: 124/68 mmHg.She does workout. She denies chest pain, shortness of breath, dizziness.  She reports that she is walking every now and then.     She is on cholesterol medication and denies myalgias. Her cholesterol is at goal. The cholesterol was:  01/18/2015: Cholesterol 209*; HDL 53; LDL Cholesterol 125; Triglycerides 156*   She has been working on diet and exercise for prediabetes without complications, she is not on bASA, she is on ACE/ARB, and denies  foot ulcerations, hyperglycemia, hypoglycemia , increased appetite, nausea, paresthesia of the feet, polydipsia, polyuria, visual disturbances, vomiting and weight loss. Last A1C was: 09/26/2014: Hgb A1c  MFr Bld 5.9*   Patient is on Vitamin D supplement. 09/26/2014: Vit D, 25-Hydroxy 22*   She is on thyroid medication. Her medication was changed last visit, she is on 1 pill daily but 1/2 on Sunday.   Lab Results  Component Value Date   TSH 0.338* 01/18/2015  .  BMI is Body mass index is 35.84 kg/(m^2)., she is working on diet and exercise. Wt Readings from Last 3 Encounters:  04/20/15 196 lb (88.905 kg)  04/10/15 198 lb (89.812 kg)  01/18/15 195 lb (88.451 kg)    Current Medications:  Current Outpatient Prescriptions on File Prior to Visit  Medication Sig Dispense Refill  . b complex vitamins tablet Take 1 tablet by mouth daily.    . calcium-vitamin D (OSCAL WITH D) 250-125 MG-UNIT per tablet Take 1 tablet by mouth daily.    . Coenzyme Q10 (COQ10 PO) Take 1 tablet by mouth 2 (two) times daily.    Marland Kitchen estradiol (VIVELLE-DOT) 0.05 MG/24HR patch Place 1 patch onto the skin 2 (two) times a week.    . Flaxseed, Linseed, (FLAX SEED OIL PO) Take 2 capsules by mouth 2 (two) times daily.    . fluticasone (FLONASE) 50 MCG/ACT nasal spray Place 2 sprays into both nostrils daily. 16 g 0  . hyoscyamine (LEVSIN SL) 0.125 MG SL tablet Place 1 tablet (0.125 mg total) under the tongue every 4 (four) hours as needed for cramping (diarrhea). 60 tablet 1  . IRON PO Take 1 tablet by mouth daily.    Marland Kitchen levothyroxine (SYNTHROID, LEVOTHROID) 112 MCG tablet TAKE 1 TABLET ON EMPTY STOMACH WITH SIP OF WATER IN  AM.WAIT 30MINS TO AN HOUR BEFORE EATING 90 tablet 1  . MAGNESIUM PO Take 1 tablet by mouth daily.    . meloxicam (MOBIC) 15 MG tablet Take 1 tablet (15 mg total) by mouth daily. 30 tablet 2  . metaxalone (SKELAXIN) 800 MG tablet Take 1 tablet (800 mg total) by mouth 3 (three) times daily. 30 tablet 1  . Multiple Vitamin (MULTIVITAMIN) capsule Take 1 capsule by mouth daily.    . Omega-3 Fatty Acids (FISH OIL PO) Take 2 capsules by mouth 2 (two) times daily.    . predniSONE (DELTASONE) 20 MG tablet 3 tabs po  day one, then 2 tabs daily x 4 days 11 tablet 0  . Red Yeast Rice Extract (RED YEAST RICE PO) Take 2 tablets by mouth 2 (two) times daily.     No current facility-administered medications on file prior to visit.   Medical History:  Past Medical History  Diagnosis Date  . Hyperlipidemia   . Vitamin D deficiency   . Osteoporosis   . Hypothyroidism   . Palpitations   . Colon polyp 12/29/2006  . Obesity     BMI 34   Allergies:  Allergies  Allergen Reactions  . Ppd [Tuberculin Purified Protein Derivative]     Positive reaction.  Negative Chest Xray 06/16/12     Review of Systems:  Review of Systems  Constitutional: Positive for malaise/fatigue. Negative for fever and chills.  HENT: Negative for congestion, ear pain and sore throat.        + snoring  Eyes: Negative.   Respiratory: Negative for cough, shortness of breath and wheezing.   Cardiovascular: Negative for chest pain, palpitations and PND.  Gastrointestinal: Negative for diarrhea, constipation, blood in stool and melena.  Genitourinary: Negative.   Neurological: Negative for dizziness, sensory change, loss of consciousness and headaches.  Psychiatric/Behavioral: Negative for depression and suicidal ideas. The patient does not have insomnia.     Family history- Review and unchanged  Social history- Review and unchanged  Physical Exam: BP 124/68 mmHg  Pulse 82  Temp(Src) 97.3 F (36.3 C) (Temporal)  Resp 14  Wt 196 lb (88.905 kg) Wt Readings from Last 3 Encounters:  04/20/15 196 lb (88.905 kg)  04/10/15 198 lb (89.812 kg)  01/18/15 195 lb (88.451 kg)   General Appearance: Well nourished well developed, non-toxic appearing, in no apparent distress. Eyes: PERRLA, EOMs, conjunctiva no swelling or erythema ENT/Mouth: Ear canals clear with no erythema, swelling, or discharge.  TMs normal bilaterally, oropharynx clear, moist, with no exudate.   Neck: Supple, thyroid normal, no JVD, no cervical adenopathy.   Respiratory: Respiratory effort normal, breath sounds clear A&P, no wheeze, rhonchi or rales noted.  No retractions, no accessory muscle usage Cardio: RRR with no MRGs. No noted edema.  Abdomen: Soft, + BS.  Non tender, no guarding, rebound, hernias, masses. Musculoskeletal: Full ROM, 5/5 strength, Normal gait Skin: Warm, dry without rashes, lesions, ecchymosis.  Neuro: Awake and oriented X 3, Cranial nerves intact. No cerebellar symptoms.  Psych: normal affect, Insight and Judgment appropriate.    Vicie Mutters, PA-C 9:41 AM [facility]

## 2015-04-21 LAB — VITAMIN D 25 HYDROXY (VIT D DEFICIENCY, FRACTURES): Vit D, 25-Hydroxy: 19 ng/mL — ABNORMAL LOW (ref 30–100)

## 2015-04-21 LAB — HEMOGLOBIN A1C
Hgb A1c MFr Bld: 5.6 % (ref <5.7)
Mean Plasma Glucose: 114 mg/dL

## 2015-04-22 ENCOUNTER — Other Ambulatory Visit: Payer: Self-pay | Admitting: Internal Medicine

## 2015-05-04 DIAGNOSIS — M542 Cervicalgia: Secondary | ICD-10-CM | POA: Diagnosis not present

## 2015-05-04 DIAGNOSIS — M25512 Pain in left shoulder: Secondary | ICD-10-CM | POA: Diagnosis not present

## 2015-05-26 DIAGNOSIS — S134XXA Sprain of ligaments of cervical spine, initial encounter: Secondary | ICD-10-CM | POA: Diagnosis not present

## 2015-05-30 DIAGNOSIS — S134XXA Sprain of ligaments of cervical spine, initial encounter: Secondary | ICD-10-CM | POA: Diagnosis not present

## 2015-06-01 DIAGNOSIS — S134XXA Sprain of ligaments of cervical spine, initial encounter: Secondary | ICD-10-CM | POA: Diagnosis not present

## 2015-06-06 DIAGNOSIS — S134XXA Sprain of ligaments of cervical spine, initial encounter: Secondary | ICD-10-CM | POA: Diagnosis not present

## 2015-06-08 DIAGNOSIS — S134XXA Sprain of ligaments of cervical spine, initial encounter: Secondary | ICD-10-CM | POA: Diagnosis not present

## 2015-06-13 DIAGNOSIS — S134XXA Sprain of ligaments of cervical spine, initial encounter: Secondary | ICD-10-CM | POA: Diagnosis not present

## 2015-06-15 DIAGNOSIS — S134XXA Sprain of ligaments of cervical spine, initial encounter: Secondary | ICD-10-CM | POA: Diagnosis not present

## 2015-06-20 DIAGNOSIS — S134XXA Sprain of ligaments of cervical spine, initial encounter: Secondary | ICD-10-CM | POA: Diagnosis not present

## 2015-06-22 DIAGNOSIS — S134XXA Sprain of ligaments of cervical spine, initial encounter: Secondary | ICD-10-CM | POA: Diagnosis not present

## 2015-06-26 ENCOUNTER — Encounter: Payer: Self-pay | Admitting: Internal Medicine

## 2015-06-30 DIAGNOSIS — L820 Inflamed seborrheic keratosis: Secondary | ICD-10-CM | POA: Diagnosis not present

## 2015-06-30 DIAGNOSIS — D1801 Hemangioma of skin and subcutaneous tissue: Secondary | ICD-10-CM | POA: Diagnosis not present

## 2015-06-30 DIAGNOSIS — L304 Erythema intertrigo: Secondary | ICD-10-CM | POA: Diagnosis not present

## 2015-06-30 DIAGNOSIS — L814 Other melanin hyperpigmentation: Secondary | ICD-10-CM | POA: Diagnosis not present

## 2015-06-30 DIAGNOSIS — D235 Other benign neoplasm of skin of trunk: Secondary | ICD-10-CM | POA: Diagnosis not present

## 2015-07-26 ENCOUNTER — Ambulatory Visit (INDEPENDENT_AMBULATORY_CARE_PROVIDER_SITE_OTHER): Payer: BLUE CROSS/BLUE SHIELD | Admitting: Physician Assistant

## 2015-07-26 ENCOUNTER — Encounter: Payer: Self-pay | Admitting: Physician Assistant

## 2015-07-26 VITALS — BP 130/70 | HR 87 | Temp 97.7°F | Resp 14 | Ht 60.0 in | Wt 197.4 lb

## 2015-07-26 DIAGNOSIS — E559 Vitamin D deficiency, unspecified: Secondary | ICD-10-CM

## 2015-07-26 DIAGNOSIS — R6889 Other general symptoms and signs: Secondary | ICD-10-CM

## 2015-07-26 DIAGNOSIS — M810 Age-related osteoporosis without current pathological fracture: Secondary | ICD-10-CM | POA: Diagnosis not present

## 2015-07-26 DIAGNOSIS — Z0001 Encounter for general adult medical examination with abnormal findings: Secondary | ICD-10-CM

## 2015-07-26 DIAGNOSIS — R7303 Prediabetes: Secondary | ICD-10-CM | POA: Diagnosis not present

## 2015-07-26 DIAGNOSIS — Z79899 Other long term (current) drug therapy: Secondary | ICD-10-CM

## 2015-07-26 DIAGNOSIS — R030 Elevated blood-pressure reading, without diagnosis of hypertension: Secondary | ICD-10-CM

## 2015-07-26 DIAGNOSIS — E039 Hypothyroidism, unspecified: Secondary | ICD-10-CM | POA: Diagnosis not present

## 2015-07-26 DIAGNOSIS — Z1159 Encounter for screening for other viral diseases: Secondary | ICD-10-CM | POA: Diagnosis not present

## 2015-07-26 DIAGNOSIS — R0989 Other specified symptoms and signs involving the circulatory and respiratory systems: Secondary | ICD-10-CM | POA: Insufficient documentation

## 2015-07-26 DIAGNOSIS — E785 Hyperlipidemia, unspecified: Secondary | ICD-10-CM

## 2015-07-26 DIAGNOSIS — R002 Palpitations: Secondary | ICD-10-CM

## 2015-07-26 DIAGNOSIS — IMO0001 Reserved for inherently not codable concepts without codable children: Secondary | ICD-10-CM

## 2015-07-26 DIAGNOSIS — Z136 Encounter for screening for cardiovascular disorders: Secondary | ICD-10-CM

## 2015-07-26 LAB — CBC WITH DIFFERENTIAL/PLATELET
Basophils Absolute: 63 cells/uL (ref 0–200)
Basophils Relative: 1 %
Eosinophils Absolute: 189 cells/uL (ref 15–500)
Eosinophils Relative: 3 %
HCT: 39.7 % (ref 35.0–45.0)
Hemoglobin: 12.7 g/dL (ref 11.7–15.5)
Lymphocytes Relative: 38 %
Lymphs Abs: 2394 cells/uL (ref 850–3900)
MCH: 25.6 pg — ABNORMAL LOW (ref 27.0–33.0)
MCHC: 32 g/dL (ref 32.0–36.0)
MCV: 80 fL (ref 80.0–100.0)
MPV: 10 fL (ref 7.5–12.5)
Monocytes Absolute: 441 cells/uL (ref 200–950)
Monocytes Relative: 7 %
Neutro Abs: 3213 cells/uL (ref 1500–7800)
Neutrophils Relative %: 51 %
Platelets: 318 10*3/uL (ref 140–400)
RBC: 4.96 MIL/uL (ref 3.80–5.10)
RDW: 15.3 % — ABNORMAL HIGH (ref 11.0–15.0)
WBC: 6.3 10*3/uL (ref 3.8–10.8)

## 2015-07-26 LAB — TSH: TSH: 0.52 m[IU]/L

## 2015-07-26 LAB — HEMOGLOBIN A1C
Hgb A1c MFr Bld: 5.9 % — ABNORMAL HIGH (ref <5.7)
MEAN PLASMA GLUCOSE: 123 mg/dL

## 2015-07-26 MED ORDER — LEVOTHYROXINE SODIUM 112 MCG PO TABS: ORAL_TABLET | ORAL | Status: DC

## 2015-07-26 NOTE — Patient Instructions (Signed)
Preventive Care for Adults  A healthy lifestyle and preventive care can promote health and wellness. Preventive health guidelines for women include the following key practices.  A routine yearly physical is a good way to check with your health care provider about your health and preventive screening. It is a chance to share any concerns and updates on your health and to receive a thorough exam.  Visit your dentist for a routine exam and preventive care every 6 months. Brush your teeth twice a day and floss once a day. Good oral hygiene prevents tooth decay and gum disease.  The frequency of eye exams is based on your age, health, family medical history, use of contact lenses, and other factors. Follow your health care provider's recommendations for frequency of eye exams.  Eat a healthy diet. Foods like vegetables, fruits, whole grains, low-fat dairy products, and lean protein foods contain the nutrients you need without too many calories. Decrease your intake of foods high in solid fats, added sugars, and salt. Eat the right amount of calories for you.Get information about a proper diet from your health care provider, if necessary.  Regular physical exercise is one of the most important things you can do for your health. Most adults should get at least 150 minutes of moderate-intensity exercise (any activity that increases your heart rate and causes you to sweat) each week. In addition, most adults need muscle-strengthening exercises on 2 or more days a week.  Maintain a healthy weight. The body mass index (BMI) is a screening tool to identify possible weight problems. It provides an estimate of body fat based on height and weight. Your health care provider can find your BMI and can help you achieve or maintain a healthy weight.For adults 20 years and older:  A BMI below 18.5 is considered underweight.  A BMI of 18.5 to 24.9 is normal.  A BMI of 25 to 29.9 is considered overweight.  A BMI of  30 and above is considered obese.  Maintain normal blood lipids and cholesterol levels by exercising and minimizing your intake of saturated fat. Eat a balanced diet with plenty of fruit and vegetables. Blood tests for lipids and cholesterol should begin at age 20 and be repeated every 5 years. If your lipid or cholesterol levels are high, you are over 50, or you are at high risk for heart disease, you may need your cholesterol levels checked more frequently.Ongoing high lipid and cholesterol levels should be treated with medicines if diet and exercise are not working.  If you smoke, find out from your health care provider how to quit. If you do not use tobacco, do not start.  Lung cancer screening is recommended for adults aged 55-80 years who are at high risk for developing lung cancer because of a history of smoking. A yearly low-dose CT scan of the lungs is recommended for people who have at least a 30-pack-year history of smoking and are a current smoker or have quit within the past 15 years. A pack year of smoking is smoking an average of 1 pack of cigarettes a day for 1 year (for example: 1 pack a day for 30 years or 2 packs a day for 15 years). Yearly screening should continue until the smoker has stopped smoking for at least 15 years. Yearly screening should be stopped for people who develop a health problem that would prevent them from having lung cancer treatment.  High blood pressure causes heart disease and increases the risk of   stroke. Your blood pressure should be checked at least every 1 to 2 years. Ongoing high blood pressure should be treated with medicines if weight loss and exercise do not work.  If you are 55-79 years old, ask your health care provider if you should take aspirin to prevent strokes.  Diabetes screening involves taking a blood sample to check your fasting blood sugar level. This should be done once every 3 years, after age 45, if you are within normal weight and  without risk factors for diabetes. Testing should be considered at a younger age or be carried out more frequently if you are overweight and have at least 1 risk factor for diabetes.  Breast cancer screening is essential preventive care for women. You should practice "breast self-awareness." This means understanding the normal appearance and feel of your breasts and may include breast self-examination. Any changes detected, no matter how small, should be reported to a health care provider. Women in their 20s and 30s should have a clinical breast exam (CBE) by a health care provider as part of a regular health exam every 1 to 3 years. After age 40, women should have a CBE every year. Starting at age 40, women should consider having a mammogram (breast X-ray test) every year. Women who have a family history of breast cancer should talk to their health care provider about genetic screening. Women at a high risk of breast cancer should talk to their health care providers about having an MRI and a mammogram every year.  Breast cancer gene (BRCA)-related cancer risk assessment is recommended for women who have family members with BRCA-related cancers. BRCA-related cancers include breast, ovarian, tubal, and peritoneal cancers. Having family members with these cancers may be associated with an increased risk for harmful changes (mutations) in the breast cancer genes BRCA1 and BRCA2. Results of the assessment will determine the need for genetic counseling and BRCA1 and BRCA2 testing.  Routine pelvic exams to screen for cancer are no longer recommended for nonpregnant women who are considered low risk for cancer of the pelvic organs (ovaries, uterus, and vagina) and who do not have symptoms. Ask your health care provider if a screening pelvic exam is right for you.  If you have had past treatment for cervical cancer or a condition that could lead to cancer, you need Pap tests and screening for cancer for at least 20  years after your treatment. If Pap tests have been discontinued, your risk factors (such as having a new sexual partner) need to be reassessed to determine if screening should be resumed. Some women have medical problems that increase the chance of getting cervical cancer. In these cases, your health care provider may recommend more frequent screening and Pap tests.  Colorectal cancer can be detected and often prevented. Most routine colorectal cancer screening begins at the age of 50 years and continues through age 75 years. However, your health care provider may recommend screening at an earlier age if you have risk factors for colon cancer. On a yearly basis, your health care provider may provide home test kits to check for hidden blood in the stool. Use of a small camera at the end of a tube, to directly examine the colon (sigmoidoscopy or colonoscopy), can detect the earliest forms of colorectal cancer. Talk to your health care provider about this at age 50, when routine screening begins. Direct exam of the colon should be repeated every 5-10 years through age 75 years, unless early forms of pre-cancerous   polyps or small growths are found.  Hepatitis C blood testing is recommended for all people born from 1945 through 1965 and any individual with known risks for hepatitis C.  Pra  Osteoporosis is a disease in which the bones lose minerals and strength with aging. This can result in serious bone fractures or breaks. The risk of osteoporosis can be identified using a bone density scan. Women ages 65 years and over and women at risk for fractures or osteoporosis should discuss screening with their health care providers. Ask your health care provider whether you should take a calcium supplement or vitamin D to reduce the rate of osteoporosis.  Menopause can be associated with physical symptoms and risks. Hormone replacement therapy is available to decrease symptoms and risks. You should talk to your  health care provider about whether hormone replacement therapy is right for you.  Use sunscreen. Apply sunscreen liberally and repeatedly throughout the day. You should seek shade when your shadow is shorter than you. Protect yourself by wearing long sleeves, pants, a wide-brimmed hat, and sunglasses year round, whenever you are outdoors.  Once a month, do a whole body skin exam, using a mirror to look at the skin on your back. Tell your health care provider of new moles, moles that have irregular borders, moles that are larger than a pencil eraser, or moles that have changed in shape or color.  Stay current with required vaccines (immunizations).  Influenza vaccine. All adults should be immunized every year.  Tetanus, diphtheria, and acellular pertussis (Td, Tdap) vaccine. Pregnant women should receive 1 dose of Tdap vaccine during each pregnancy. The dose should be obtained regardless of the length of time since the last dose. Immunization is preferred during the 27th-36th week of gestation. An adult who has not previously received Tdap or who does not know her vaccine status should receive 1 dose of Tdap. This initial dose should be followed by tetanus and diphtheria toxoids (Td) booster doses every 10 years. Adults with an unknown or incomplete history of completing a 3-dose immunization series with Td-containing vaccines should begin or complete a primary immunization series including a Tdap dose. Adults should receive a Td booster every 10 years.  Varicella vaccine. An adult without evidence of immunity to varicella should receive 2 doses or a second dose if she has previously received 1 dose. Pregnant females who do not have evidence of immunity should receive the first dose after pregnancy. This first dose should be obtained before leaving the health care facility. The second dose should be obtained 4-8 weeks after the first dose.  Human papillomavirus (HPV) vaccine. Females aged 13-26 years  who have not received the vaccine previously should obtain the 3-dose series. The vaccine is not recommended for use in pregnant females. However, pregnancy testing is not needed before receiving a dose. If a female is found to be pregnant after receiving a dose, no treatment is needed. In that case, the remaining doses should be delayed until after the pregnancy. Immunization is recommended for any person with an immunocompromised condition through the age of 26 years if she did not get any or all doses earlier. During the 3-dose series, the second dose should be obtained 4-8 weeks after the first dose. The third dose should be obtained 24 weeks after the first dose and 16 weeks after the second dose.  Zoster vaccine. One dose is recommended for adults aged 60 years or older unless certain conditions are present.  Measles, mumps, and rubella (  MMR) vaccine. Adults born before [birth year] generally are considered immune to measles and mumps. Adults born in [birth year] or later should have 1 or more doses of MMR vaccine unless there is a contraindication to the vaccine or there is laboratory evidence of immunity to each of the three diseases. A routine second dose of MMR vaccine should be obtained at least 28 days after the first dose for students attending postsecondary schools, health care workers, or international travelers. People who received inactivated measles vaccine or an unknown type of measles vaccine during 1963-1967 should receive 2 doses of MMR vaccine. People who received inactivated mumps vaccine or an unknown type of mumps vaccine before 1979 and are at high risk for mumps infection should consider immunization with 2 doses of MMR vaccine. For females of childbearing age, rubella immunity should be determined. If there is no evidence of immunity, females who are not pregnant should be vaccinated. If there is no evidence of immunity, females who are pregnant should delay immunization until after pregnancy.  Unvaccinated health care workers born before [birth year] who lack laboratory evidence of measles, mumps, or rubella immunity or laboratory confirmation of disease should consider measles and mumps immunization with 2 doses of MMR vaccine or rubella immunization with 1 dose of MMR vaccine.  Pneumococcal 13-valent conjugate (PCV13) vaccine. When indicated, a person who is uncertain of her immunization history and has no record of immunization should receive the PCV13 vaccine. An adult aged 39 years or older who has certain medical conditions and has not been previously immunized should receive 1 dose of PCV13 vaccine. This PCV13 should be followed with a dose of pneumococcal polysaccharide (PPSV23) vaccine. The PPSV23 vaccine dose should be obtained at least 8 weeks after the dose of PCV13 vaccine. An adult aged 62 years or older who has certain medical conditions and previously received 1 or more doses of PPSV23 vaccine should receive 1 dose of PCV13. The PCV13 vaccine dose should be obtained 1 or more years after the last PPSV23 vaccine dose.    Pneumococcal polysaccharide (PPSV23) vaccine. When PCV13 is also indicated, PCV13 should be obtained first. All adults aged 67 years and older should be immunized. An adult younger than age 45 years who has certain medical conditions should be immunized. Any person who resides in a nursing home or long-term care facility should be immunized. An adult smoker should be immunized. People with an immunocompromised condition and certain other conditions should receive both PCV13 and PPSV23 vaccines. People with human immunodeficiency virus (HIV) infection should be immunized as soon as possible after diagnosis. Immunization during chemotherapy or radiation therapy should be avoided. Routine use of PPSV23 vaccine is not recommended for American Indians, [place] Natives, or people younger than 65 years unless there are medical conditions that require PPSV23 vaccine. When indicated,  people who have unknown immunization and have no record of immunization should receive PPSV23 vaccine. One-time revaccination 5 years after the first dose of PPSV23 is recommended for people aged 19-64 years who have chronic kidney failure, nephrotic syndrome, asplenia, or immunocompromised conditions. People who received 1-2 doses of PPSV23 before age 23 years should receive another dose of PPSV23 vaccine at age 35 years or later if at least 5 years have passed since the previous dose. Doses of PPSV23 are not needed for people immunized with PPSV23 at or after age 38 years.  Preventive Services / Frequency   Ages 43 to 86 years  Blood pressure check.  Lipid and cholesterol check.  Lung  cancer screening. / Every year if you are aged 74-80 years and have a 30-pack-year history of smoking and currently smoke or have quit within the past 15 years. Yearly screening is stopped once you have quit smoking for at least 15 years or develop a health problem that would prevent you from having lung cancer treatment.  Clinical breast exam.** / Every year after age 45 years.  BRCA-related cancer risk assessment.** / For women who have family members with a BRCA-related cancer (breast, ovarian, tubal, or peritoneal cancers).  Mammogram.** / Every year beginning at age 23 years and continuing for as long as you are in good health. Consult with your health care provider.  Pap test.** / Every 3 years starting at age 67 years through age 77 or 45 years with a history of 3 consecutive normal Pap tests.  HPV screening.** / Every 3 years from ages 71 years through ages 48 to 36 years with a history of 3 consecutive normal Pap tests.  Fecal occult blood test (FOBT) of stool. / Every year beginning at age 70 years and continuing until age 66 years. You may not need to do this test if you get a colonoscopy every 10 years.  Flexible sigmoidoscopy or colonoscopy.** / Every 5 years for a flexible sigmoidoscopy or  every 10 years for a colonoscopy beginning at age 63 years and continuing until age 21 years.  Hepatitis C blood test.** / For all people born from [birth year] through 1965 and any individual with known risks for hepatitis C.  Skin self-exam. / Monthly.  Influenza vaccine. / Every year.  Tetanus, diphtheria, and acellular pertussis (Tdap/Td) vaccine.** / Consult your health care provider. Pregnant women should receive 1 dose of Tdap vaccine during each pregnancy. 1 dose of Td every 10 years.  Varicella vaccine.** / Consult your health care provider. Pregnant females who do not have evidence of immunity should receive the first dose after pregnancy.  Zoster vaccine.** / 1 dose for adults aged 25 years or older.  Pneumococcal 13-valent conjugate (PCV13) vaccine.** / Consult your health care provider.  Pneumococcal polysaccharide (PPSV23) vaccine.** / 1 to 2 doses if you smoke cigarettes or if you have certain conditions.  Meningococcal vaccine.** / Consult your health care provider.  Hepatitis A vaccine.** / Consult your health care provider.  Hepatitis B vaccine.** / Consult your health care provider. Screening for abdominal aortic aneurysm (AAA)  by ultrasound is recommended for people over 50 who have history of high blood pressure or who are current or former smokers.

## 2015-07-26 NOTE — Progress Notes (Signed)
Patient ID: Natalie Baker, female   DOB: 09/07/1955, 60 y.o.   MRN: [id number]  Complete Physical  Assessment and Plan: Other specified hypothyroidism -patient reports taking it correctly -adjust as needed per labs - TSH - refill medication  Palpitations -currently well controlled, continue to avoid triggers - EKG 12-Lead  Hyperlipidemia -diet and exercise -if no improvement consider using a prescription medicaiton - Lipid panel  Vitamin D deficiency -cont supplement - Vit D  25 hydroxy (rtn osteoporosis monitoring)  Osteoporosis -Vit D and calcium recommended - last DEXA 2016, in osteopenia range, due next year  Morbid Obesity -diet and exercise discussed  - will try nutritional information before trying medications - will refer to Nutrition and diabetes education services of [place].    Medication management - CBC with Differential/Platelet - BASIC METABOLIC PANEL WITH GFR - Hepatic function panel - Magnesium  Prediabetes - Hemoglobin A1c - Insulin, random  Elevated BP - Urinalysis, Routine w reflex microscopic (not at [facility]) - Microalbumin / creatinine urine ratio - EKG 12-Lead  Discussed med's effects and SE's. Screening labs and tests as requested with regular follow-up as recommended. Future Appointments Date Time Provider [place]  07/29/2016 10:00 AM Vicie Mutters, PA-C [id number] None    HPI  60 y.o. female  presents for a complete physical.  Her blood pressure has been controlled at home, today their BP is BP: 130/70 mmHg.  She does workout occ but is not consistent.  She denies chest pain, shortness of breath, dizziness.   Saw dermatology.  She is on cholesterol medication and denies myalgias. Her cholesterol is not at goal. The cholesterol last visit was:  Lab Results  Component Value Date   CHOL 193 04/20/2015   HDL 51 04/20/2015   LDLCALC 102 04/20/2015   TRIG 201* 04/20/2015   CHOLHDL 3.8 04/20/2015  . She has been  working on diet and exercise for prediabetes,  and denies foot ulcerations, hyperglycemia, hypoglycemia , increased appetite, nausea, paresthesia of the feet, polydipsia, polyuria, visual disturbances, vomiting and weight loss. Last A1C in the office was:  Lab Results  Component Value Date   HGBA1C 5.6 04/20/2015   Patient is on Vitamin D supplement.   Lab Results  Component Value Date   VD25OH 19* 04/20/2015     She is on thyroid medication. Her medication was not changed last visit.   Lab Results  Component Value Date   TSH 0.71 04/20/2015  .  BMI is Body mass index is 38.55 kg/(m^2)., she is working on diet and exercise. She has osteoporosis and is on vivelle dot.  Wt Readings from Last 3 Encounters:  07/26/15 197 lb 6.4 oz (89.54 kg)  04/20/15 196 lb (88.905 kg)  04/10/15 198 lb (89.812 kg)     Current Medications:  Current Outpatient Prescriptions on File Prior to Visit  Medication Sig Dispense Refill  . b complex vitamins tablet Take 1 tablet by mouth daily.    . calcium-vitamin D (OSCAL WITH D) 250-125 MG-UNIT per tablet Take 1 tablet by mouth daily.    . Coenzyme Q10 (COQ10 PO) Take 1 tablet by mouth 2 (two) times daily.    Marland Kitchen estradiol (VIVELLE-DOT) 0.05 MG/24HR patch Place 1 patch onto the skin 2 (two) times a week.    . Flaxseed, Linseed, (FLAX SEED OIL PO) Take 2 capsules by mouth 2 (two) times daily.    . IRON PO Take 1 tablet by mouth daily.    Marland Kitchen levothyroxine (SYNTHROID, LEVOTHROID) 112 MCG  tablet TAKE 1 TABLET ON EMPTY STOMACH WITH SIP OF WATER IN AM.WAIT 30MINS TO AN HOUR BEFORE EATING 90 tablet 1  . MAGNESIUM PO Take 1 tablet by mouth daily.    . meloxicam (MOBIC) 15 MG tablet Take 1 tablet (15 mg total) by mouth daily. 30 tablet 2  . metaxalone (SKELAXIN) 800 MG tablet Take 1 tablet (800 mg total) by mouth 3 (three) times daily. 30 tablet 1  . Multiple Vitamin (MULTIVITAMIN) capsule Take 1 capsule by mouth daily.    . Omega-3 Fatty Acids (FISH OIL PO) Take 2  capsules by mouth 2 (two) times daily.    . Red Yeast Rice Extract (RED YEAST RICE PO) Take 2 tablets by mouth 2 (two) times daily.     No current facility-administered medications on file prior to visit.    Health Maintenance:   Immunization History  Administered Date(s) Administered  . Influenza Split 11/06/2010   Tetanus: 2008 Influenza declines Prevnar due 65 Pneumonia due 65 Shingles due 60  Pap: 07/08/11, goes to GYN, but has had hysterectomy MGM: 01/2015, Cat B, normal left breast US DEXA: 2014 Colonoscopy: 12/29/2006 due next year Last Dental Exam: Dr. Britta Mccreedy and Sarajane Jews Last Eye Exam: Dr. Danella Maiers  Patient Care Team: [name], MD as PCP - General (Internal Medicine) Gatha Mayer, MD as Consulting Physician (Gastroenterology) Paula Compton, MD as Consulting Physician (Obstetrics and Gynecology) Druscilla Brownie, MD as Consulting Physician (Dermatology)  Medical History:  Past Medical History  Diagnosis Date  . Hyperlipidemia   . Vitamin D deficiency   . Osteoporosis   . Hypothyroidism   . Palpitations   . Colon polyp 12/29/2006  . Obesity     BMI 34   Allergies Allergies  Allergen Reactions  . Ppd [Tuberculin Purified Protein Derivative]     Positive reaction.  Negative Chest Xray 06/16/12    SURGICAL HISTORY She  has past surgical history that includes Abdominal hysterectomy (1997); Cesarean section (1983); and Colonoscopy w/ biopsies (12/29/2006). FAMILY HISTORY Her family history includes Alcohol abuse in her father; Bone cancer in her maternal aunt; Breast cancer in her maternal aunt; Cancer (age of onset: 27) in her mother; Diabetes in her mother and paternal grandmother; Heart disease in her father and maternal aunt; Heart disease (age of onset: 58) in her mother; Hyperlipidemia in her brother and maternal aunt; Hypertension in her brother, father, and mother; Liver disease in her maternal grandmother and mother; Thyroid disease in her  mother. SOCIAL HISTORY She  reports that she quit smoking about 19 years ago. Her smoking use included Cigarettes. She quit after 15 years of use. She does not have any smokeless tobacco history on file. She reports that she does not drink alcohol or use illicit drugs.   Review of Systems: Review of Systems  Constitutional: Positive for malaise/fatigue. Negative for fever, chills and weight loss.  HENT: Negative for congestion, ear pain and sore throat.   Eyes: Negative.   Respiratory: Negative for cough, shortness of breath and wheezing.   Cardiovascular: Negative for chest pain, palpitations and leg swelling.  Gastrointestinal: Negative for heartburn, nausea, vomiting, diarrhea, constipation, blood in stool and melena.  Genitourinary: Negative.   Musculoskeletal: Positive for joint pain and neck pain.  Skin: Negative for itching.  Neurological: Negative for dizziness, tingling, sensory change, loss of consciousness and headaches.  Psychiatric/Behavioral: Negative for depression. The patient is not nervous/anxious and does not have insomnia.     Physical Exam: Estimated body mass index is  38.55 kg/(m^2) as calculated from the following:   Height as of this encounter: 5' (1.524 m).   Weight as of this encounter: 197 lb 6.4 oz (89.54 kg). BP 130/70 mmHg  Pulse 87  Temp(Src) 97.7 F (36.5 C) (Temporal)  Resp 14  Ht 5' (1.524 m)  Wt 197 lb 6.4 oz (89.54 kg)  BMI 38.55 kg/m2  SpO2 98%  General Appearance: Well nourished well developed, in no apparent distress.  Eyes: PERRLA, EOMs, conjunctiva no swelling or erythema ENT/Mouth: Ear canals normal without obstruction, swelling, erythema, or discharge.  TMs normal bilaterally with no erythema, bulging, retraction, or loss of landmark.  Oropharynx moist and clear with no exudate, erythema, or swelling.   Neck: Supple, thyroid normal. No bruits.  No cervical adenopathy Respiratory: Respiratory effort normal, Breath sounds clear A&P  without wheeze, rhonchi, rales.   Cardio: RRR without murmurs, rubs or gallops, occ extra beat. Brisk peripheral pulses without edema.  Chest: symmetric, with normal excursions Breasts: Symmetric, without lumps, nipple discharge, retractions.  Abdomen: Morbidly obese, Soft, nontender, no guarding, rebound, hernias, masses, or organomegaly.  Lymphatics: Non tender without lymphadenopathy.  Musculoskeletal: Full ROM all peripheral extremities,5/5 strength, and normal gait.  Skin: Warm, dry without rashes, lesions, ecchymosis. Neuro: Awake and oriented X 3, Cranial nerves intact, reflexes equal bilaterally. Normal muscle tone, no cerebellar symptoms. Sensation intact.  Psych:  normal affect, Insight and Judgment appropriate.   EKG: WNL, PRWP no ST changes. AORTA SCAN: defer  Over 40 minutes of exam, counseling, chart review and critical decision making was performed  Vicie Mutters 10:08 AM [facility]

## 2015-07-27 LAB — HEPATIC FUNCTION PANEL
ALK PHOS: 71 U/L (ref 33–130)
ALT: 18 U/L (ref 6–29)
AST: 18 U/L (ref 10–35)
Albumin: 4.4 g/dL (ref 3.6–5.1)
BILIRUBIN DIRECT: 0.1 mg/dL (ref <=0.2)
BILIRUBIN INDIRECT: 0.2 mg/dL (ref 0.2–1.2)
TOTAL PROTEIN: 6.9 g/dL (ref 6.1–8.1)
Total Bilirubin: 0.3 mg/dL (ref 0.2–1.2)

## 2015-07-27 LAB — LIPID PANEL
CHOLESTEROL: 210 mg/dL — AB (ref 125–200)
HDL: 60 mg/dL (ref >=46)
LDL Cholesterol: 122 mg/dL (ref <130)
Total CHOL/HDL Ratio: 3.5 Ratio (ref <=5.0)
Triglycerides: 139 mg/dL (ref <150)
VLDL: 28 mg/dL (ref <30)

## 2015-07-27 LAB — URINALYSIS, ROUTINE W REFLEX MICROSCOPIC
Bilirubin Urine: NEGATIVE
Glucose, UA: NEGATIVE
HGB URINE DIPSTICK: NEGATIVE
KETONES UR: NEGATIVE
Leukocytes, UA: NEGATIVE
NITRITE: NEGATIVE
Protein, ur: NEGATIVE
Specific Gravity, Urine: 1.015 (ref 1.001–1.035)
pH: 6.5 (ref 5.0–8.0)

## 2015-07-27 LAB — VITAMIN D 25 HYDROXY (VIT D DEFICIENCY, FRACTURES): Vit D, 25-Hydroxy: 28 ng/mL — ABNORMAL LOW (ref 30–100)

## 2015-07-27 LAB — BASIC METABOLIC PANEL WITH GFR
BUN: 12 mg/dL (ref 7–25)
CALCIUM: 9.6 mg/dL (ref 8.6–10.4)
CO2: 27 mmol/L (ref 20–31)
Chloride: 102 mmol/L (ref 98–110)
Creat: 0.76 mg/dL (ref 0.50–1.05)
GFR, EST NON AFRICAN AMERICAN: 86 mL/min (ref >=60)
GFR, Est African American: >89 mL/min (ref >=60)
Glucose, Bld: 85 mg/dL (ref 65–99)
POTASSIUM: 4.6 mmol/L (ref 3.5–5.3)
SODIUM: 141 mmol/L (ref 135–146)

## 2015-07-27 LAB — MICROALBUMIN / CREATININE URINE RATIO
CREATININE, URINE: 77 mg/dL (ref 20–320)
MICROALB UR: 1 mg/dL
Microalb Creat Ratio: 13 mcg/mg creat (ref <30)

## 2015-07-27 LAB — HIV ANTIBODY (ROUTINE TESTING W REFLEX): HIV 1&2 Ab, 4th Generation: NONREACTIVE

## 2015-07-27 LAB — HEPATITIS C ANTIBODY: HCV AB: NEGATIVE

## 2015-07-27 LAB — MAGNESIUM: MAGNESIUM: 1.9 mg/dL (ref 1.5–2.5)

## 2015-08-22 ENCOUNTER — Encounter: Payer: Self-pay | Admitting: Internal Medicine

## 2015-08-22 ENCOUNTER — Ambulatory Visit (INDEPENDENT_AMBULATORY_CARE_PROVIDER_SITE_OTHER): Payer: BLUE CROSS/BLUE SHIELD | Admitting: Internal Medicine

## 2015-08-22 VITALS — BP 108/60 | HR 64 | Temp 98.2°F | Resp 16 | Ht 60.0 in | Wt 198.0 lb

## 2015-08-22 DIAGNOSIS — R55 Syncope and collapse: Secondary | ICD-10-CM | POA: Diagnosis not present

## 2015-08-22 DIAGNOSIS — M7522 Bicipital tendinitis, left shoulder: Secondary | ICD-10-CM

## 2015-08-22 LAB — COMPREHENSIVE METABOLIC PANEL
ALBUMIN: 4.3 g/dL (ref 3.6–5.1)
ALT: 16 U/L (ref 6–29)
AST: 19 U/L (ref 10–35)
Alkaline Phosphatase: 64 U/L (ref 33–130)
BUN: 15 mg/dL (ref 7–25)
CALCIUM: 9.3 mg/dL (ref 8.6–10.4)
CHLORIDE: 102 mmol/L (ref 98–110)
CO2: 27 mmol/L (ref 20–31)
Creat: 0.87 mg/dL (ref 0.50–1.05)
Glucose, Bld: 89 mg/dL (ref 65–99)
Potassium: 4.4 mmol/L (ref 3.5–5.3)
Sodium: 137 mmol/L (ref 135–146)
Total Bilirubin: 0.2 mg/dL (ref 0.2–1.2)
Total Protein: 6.5 g/dL (ref 6.1–8.1)

## 2015-08-22 LAB — CBC WITH DIFFERENTIAL/PLATELET
Basophils Absolute: 0 cells/uL (ref 0–200)
Basophils Relative: 0 %
EOS ABS: 148 {cells}/uL (ref 15–500)
Eosinophils Relative: 2 %
HEMATOCRIT: 34.9 % — AB (ref 35.0–45.0)
HEMOGLOBIN: 11.1 g/dL — AB (ref 11.7–15.5)
LYMPHS ABS: 2812 {cells}/uL (ref 850–3900)
MCH: 25.1 pg — ABNORMAL LOW (ref 27.0–33.0)
MCHC: 31.8 g/dL — ABNORMAL LOW (ref 32.0–36.0)
MCV: 78.8 fL — AB (ref 80.0–100.0)
MONO ABS: 592 {cells}/uL (ref 200–950)
MPV: 9.9 fL (ref 7.5–12.5)
Monocytes Relative: 8 %
Neutro Abs: 3848 cells/uL (ref 1500–7800)
Neutrophils Relative %: 52 %
Platelets: 270 10*3/uL (ref 140–400)
RBC: 4.43 MIL/uL (ref 3.80–5.10)
RDW: 15.6 % — ABNORMAL HIGH (ref 11.0–15.0)
WBC: 7.4 10*3/uL (ref 3.8–10.8)

## 2015-08-22 MED ORDER — METAXALONE 800 MG PO TABS: 800.0000 mg | ORAL_TABLET | Freq: Three times a day (TID) | ORAL | 1 refills | Status: DC

## 2015-08-22 NOTE — Patient Instructions (Signed)
Syncope, commonly known as fainting, is a temporary loss of consciousness. It occurs when the blood flow to the brain is reduced. Vasovagal syncope (also called neurocardiogenic syncope) is a fainting spell in which the blood flow to the brain is reduced because of a sudden drop in heart rate and blood pressure. Vasovagal syncope occurs when the brain and the cardiovascular system (blood vessels) do not adequately communicate and respond to each other. This is the most common cause of fainting. It often occurs in response to fear or some other type of emotional or physical stress. The body has a reaction in which the heart starts beating too slowly or the blood vessels expand, reducing blood pressure. This type of fainting spell is generally considered harmless. However, injuries can occur if a person takes a sudden fall during a fainting spell.   CAUSES   Vasovagal syncope occurs when a person's blood pressure and heart rate decrease suddenly, usually in response to a trigger. Many things and situations can trigger an episode. Some of these include:    Pain.    Fear.    The sight of blood or medical procedures, such as blood being drawn from a vein.    Common activities, such as coughing, swallowing, stretching, or going to the bathroom.    Emotional stress.    Prolonged standing, especially in a warm environment.    Lack of sleep or rest.    Prolonged lack of food.    Prolonged lack of fluids.    Recent illness.   The use of certain drugs that affect blood pressure, such as cocaine, alcohol, marijuana, inhalants, and opiates.   SYMPTOMS   Before the fainting episode, you may:    Feel dizzy or light headed.    Become pale.   Sense that you are going to faint.    Feel like the room is spinning.    Have tunnel vision, only seeing directly in front of you.    Feel sick to your stomach (nauseous).    See spots or slowly lose vision.    Hear ringing in your ears.    Have a headache.     Feel warm and sweaty.    Feel a sensation of pins and needles.  During the fainting spell, you will generally be unconscious for no longer than a couple minutes before waking up and returning to normal. If you get up too quickly before your body can recover, you may faint again. Some twitching or jerky movements may occur during the fainting spell.   DIAGNOSIS   Your health care provider will ask about your symptoms, take a medical history, and perform a physical exam. Various tests may be done to rule out other causes of fainting. These may include blood tests and tests to check the heart, such as electrocardiography, echocardiography, and possibly an electrophysiology study. When other causes have been ruled out, a test may be done to check the body's response to changes in position (tilt table test).  TREATMENT   Most cases of vasovagal syncope do not require treatment. Your health care provider may recommend ways to avoid fainting triggers and may provide home strategies for preventing fainting. If you must be exposed to a possible trigger, you can drink additional fluids to help reduce your chances of having an episode of vasovagal syncope. If you have warning signs of an oncoming episode, you can respond by positioning yourself favorably (lying down).  If your fainting spells continue, you may be   given medicines to prevent fainting. Some medicines may help make you more resistant to repeated episodes of vasovagal syncope. Special exercises or compression stockings may be recommended. In rare cases, the surgical placement of a pacemaker is considered.  HOME CARE INSTRUCTIONS    Learn to identify the warning signs of vasovagal syncope.    Sit or lie down at the first warning sign of a fainting spell. If sitting, put your head down between your legs. If you lie down, swing your legs up in the air to increase blood flow to the brain.    Avoid hot tubs and saunas.   Avoid prolonged standing.   Drink  enough fluids to keep your urine clear or pale yellow. Avoid caffeine.   Increase salt in your diet as directed by your health care provider.    If you have to stand for a long time, perform movements such as:     Crossing your legs.     Flexing and stretching your leg muscles.     Squatting.     Moving your legs.     Bending over.    Only take over-the-counter or prescription medicines as directed by your health care provider. Do not suddenly stop any medicines without asking your health care provider first.  SEEK MEDICAL CARE IF:    Your fainting spells continue or happen more frequently in spite of treatment.    You lose consciousness for more than a couple minutes.   You have fainting spells during or after exercising or after being startled.    You have new symptoms that occur with the fainting spells, such as:     Shortness of breath.    Chest pain.     Irregular heartbeat.    You have episodes of twitching or jerky movements that last longer than a few seconds.   You have episodes of twitching or jerky movements without obvious fainting.  SEEK IMMEDIATE MEDICAL CARE IF:    You have injuries or bleeding after a fainting spell.    You have episodes of twitching or jerky movements that last longer than 5 minutes.    You have more than one spell of twitching or jerky movements before returning to consciousness after fainting.     This information is not intended to replace advice given to you by your health care provider. Make sure you discuss any questions you have with your health care provider.     Document Released: 12/11/2011 Document Revised: 05/10/2014 Document Reviewed: 12/11/2011  Elsevier Interactive Patient Education 2016 [employer]

## 2015-08-22 NOTE — Progress Notes (Signed)
   Subjective:    Patient ID: Natalie Baker, female    DOB: 1955-04-16, 60 y.o.   MRN: [id number]  HPI  Patient presents to the office for evaluation of syncope.  She reports that she donated blood and she had finished giving blood and then fainted.  She reports that she felt light headed and dizzy. She reports that she fell on her right shoulder and did hit her head.  She reports that she was only down for less than a few minutes.  She reports that she did have some loss of her bladder as well.  She reports that she has never fainted before.  She reports that she has had a presyncopal episode after seeing somebody in the hospital.  She has given blood many times before.  She reports that she normally sits for a few minutes prior to moving and didn't do this.  She reports that it took her several hours to feel normal.  No headaches, nausea blurry vision vomiting.  Her right shoulder is sore.  She is not having severe neck pain.  She has been doing her PT exercises.  She has not been putting any heat on it.  She has been taking her muscle relaxer and has also been taking ibuprofen.    Review of Systems  Constitutional: Positive for fatigue. Negative for chills and fever.  Respiratory: Negative for cough, chest tightness, shortness of breath and wheezing.   Gastrointestinal: Negative for abdominal pain, constipation, diarrhea, nausea and vomiting.  Neurological: Positive for dizziness, syncope and light-headedness. Negative for weakness and headaches.       Objective:   Physical Exam  Constitutional: She is oriented to person, place, and time. She appears well-developed and well-nourished. No distress.  HENT:  Head: Normocephalic.  Mouth/Throat: Oropharynx is clear and moist. No oropharyngeal exudate.  Eyes: Conjunctivae are normal. No scleral icterus.  Neck: Normal range of motion. Neck supple. No JVD present. No thyromegaly present.  Cardiovascular: Normal rate, regular rhythm, normal  heart sounds and intact distal pulses.  Exam reveals no gallop and no friction rub.   No murmur heard. Pulmonary/Chest: Effort normal and breath sounds normal. No respiratory distress. She has no wheezes. She has no rales. She exhibits no tenderness.  Abdominal: Soft. Bowel sounds are normal. She exhibits no distension and no mass. There is no tenderness. There is no rebound and no guarding.  Musculoskeletal: Normal range of motion.  Bruising to right shoulder with normal active and passive ROM and normal strength testing  Lymphadenopathy:    She has no cervical adenopathy.  Neurological: She is alert and oriented to person, place, and time.  Skin: Skin is warm and dry. She is not diaphoretic.  Psychiatric: She has a normal mood and affect. Her behavior is normal. Judgment and thought content normal.  Nursing note and vitals reviewed.   Vitals:   08/22/15 1548  BP: 108/60  Pulse: 64  Resp: 16  Temp: 98.2 F (36.8 C)           Assessment & Plan:    1. Biceps tendonitis, left  - metaxalone (SKELAXIN) 800 MG tablet; Take 1 tablet (800 mg total) by mouth 3 (three) times daily.  Dispense: 30 tablet; Refill: 1  2. Syncope and collapse -EKG normal here -likely vasovagal syncope but will rule out any further causes - Comprehensive metabolic panel - CBC with Differential/Platelet

## 2015-08-28 ENCOUNTER — Encounter: Payer: Self-pay | Admitting: *Deleted

## 2015-09-01 DIAGNOSIS — M7502 Adhesive capsulitis of left shoulder: Secondary | ICD-10-CM | POA: Diagnosis not present

## 2015-09-04 ENCOUNTER — Encounter: Payer: BLUE CROSS/BLUE SHIELD | Attending: Internal Medicine | Admitting: Dietician

## 2015-09-04 DIAGNOSIS — R030 Elevated blood-pressure reading, without diagnosis of hypertension: Secondary | ICD-10-CM | POA: Insufficient documentation

## 2015-09-04 DIAGNOSIS — Z713 Dietary counseling and surveillance: Secondary | ICD-10-CM | POA: Diagnosis not present

## 2015-09-04 DIAGNOSIS — R7303 Prediabetes: Secondary | ICD-10-CM | POA: Diagnosis not present

## 2015-09-04 DIAGNOSIS — E785 Hyperlipidemia, unspecified: Secondary | ICD-10-CM | POA: Diagnosis not present

## 2015-09-04 DIAGNOSIS — E669 Obesity, unspecified: Secondary | ICD-10-CM

## 2015-09-04 NOTE — Progress Notes (Signed)
  Medical Nutrition Therapy:  Appt start time: 1630 end time:  1720.   Assessment:  Primary concerns today: Natalie Baker is here today since she has gained weight d/t thyroid problems which runs in her family. Started gaining weight in past 10 years (about 30-40 lbs in spurts). Has been at current weight for about 2 years. Recently has been trying to limit fried foods and cut portions. Weighs herself every Saturday and lost about 5 lbs in the past 4-5 weeks.  Works for an [employer] 7:30-4:30. Lives with her husband and states that she does the food shopping and meal preparation at home. Does not miss or skips meals. Eats out about 2 times per week.  Doesn't eat a lot of bread or chips but thinks snacking might be her problem. Feels like portions can be a problem sometimes. Has not been exercising but loves to walk and bike. Has a stationary bike at home.   Feels like carbs might make her feel sleepy. Tries to stay away from carbs at lunch and dinner.  Hgb A1c was 5.9% and total cholesterol was 210 recently.    Preferred Learning Style:   No preference indicated   Learning Readiness:   Ready  MEDICATIONS: see list   DIETARY INTAKE:  Usual eating pattern includes 3 meals and 0-2 snacks per day.  Avoided foods include: liver, hominy    24-hr recall:  B ( AM): oatmeal (sometimes flavored) with peanut butter and cinnamon or yogurt or eggs with toast and grits (not often) Snk ( AM): none   L ( PM): baked chicken with green beans and squash  Snk ( PM): none or after work almonds or peanuts or ice cream D ( PM): salad with chicken/tuna or baked chicken and green beans and cabbage Snk ( PM): none or almonds or peanuts or ice cream frozen yogurt or ice cream Beverages: water, coffee with flavored creamer  Usual physical activity: not recently  Estimated energy needs: 1600 calories 180 g carbohydrates 120 g protein 44 g fat  Progress Towards Goal(s):  In progress.   Nutritional  Diagnosis:  [place]-3.3 Overweight/obesity As related to hx of excessive snacking.  As evidenced by BMI of 36.1.    Intervention:  Nutrition counseling provided. Plan: For breakfast, try non fat Greek plain yogurt and sweeten with 1 tsp of honey/brown sugar and almonds. Try adding 1/2 to 1 cup portion of sweet potatoes or fruit at meals (especially lunch).  If you are hungry for a snack - have with protein with carbs (fruit with nuts). Consider not buying sweets, but instead go out to get a treat every once in a while. Try having snacks at table instead of in front of TV. Walk or use bike 20 minutes 3 x week (or more). (goal 150 minutes).  Teaching Method Utilized:  Visual Auditory Hands on  Handouts given during visit include:  MyPlate Handout  15 g CHO Snacks  Meal Card  Barriers to learning/adherence to lifestyle change: none  Demonstrated degree of understanding via:  Teach Back   Monitoring/Evaluation:  Dietary intake, exercise, and body weight prn.

## 2015-09-04 NOTE — Patient Instructions (Addendum)
For breakfast, try non fat Greek plain yogurt and sweeten with 1 tsp of honey/brown sugar and almonds. Try adding 1/2 to 1 cup portion of sweet potatoes or fruit at meals (especially lunch).  If you are hungry for a snack - have with protein with carbs (fruit with nuts). Consider not buying sweets, but instead go out to get a treat every once in a while. Try having snacks at table instead of in front of TV. Walk or use bike 20 minutes 3 x week (or more). (goal 150 minutes).

## 2015-11-16 ENCOUNTER — Ambulatory Visit (INDEPENDENT_AMBULATORY_CARE_PROVIDER_SITE_OTHER): Payer: BLUE CROSS/BLUE SHIELD | Admitting: Physician Assistant

## 2015-11-16 ENCOUNTER — Encounter: Payer: Self-pay | Admitting: Physician Assistant

## 2015-11-16 DIAGNOSIS — M25512 Pain in left shoulder: Secondary | ICD-10-CM

## 2015-11-16 DIAGNOSIS — M7500 Adhesive capsulitis of unspecified shoulder: Secondary | ICD-10-CM

## 2015-11-16 DIAGNOSIS — G8929 Other chronic pain: Secondary | ICD-10-CM

## 2015-11-16 DIAGNOSIS — E559 Vitamin D deficiency, unspecified: Secondary | ICD-10-CM

## 2015-11-16 DIAGNOSIS — E785 Hyperlipidemia, unspecified: Secondary | ICD-10-CM | POA: Diagnosis not present

## 2015-11-16 DIAGNOSIS — R030 Elevated blood-pressure reading, without diagnosis of hypertension: Secondary | ICD-10-CM | POA: Diagnosis not present

## 2015-11-16 DIAGNOSIS — M25519 Pain in unspecified shoulder: Secondary | ICD-10-CM

## 2015-11-16 DIAGNOSIS — R7303 Prediabetes: Secondary | ICD-10-CM

## 2015-11-16 DIAGNOSIS — E039 Hypothyroidism, unspecified: Secondary | ICD-10-CM

## 2015-11-16 HISTORY — DX: Adhesive capsulitis of unspecified shoulder: M75.00

## 2015-11-16 NOTE — Progress Notes (Signed)
Assessment and Plan:   Hypertension -Continue medication, monitor blood pressure at home. Continue DASH diet.  Reminder to go to the ER if any CP, SOB, nausea, dizziness, severe HA, changes vision/speech, left arm numbness and tingling and jaw pain.  Cholesterol -Continue diet and exercise. Check cholesterol.    Prediabetes  -Continue diet and exercise. Check A1C  Vitamin D Def - check level and continue medications.   Neck/left shoulder pain ? Adhesion, decreased ROM left shoulder versus radicular pain-  Follow up ortho for MRI, ? Need neck imaged as well Will try lyrica samples first if don't work or AE will do amitriptyline  Continue diet and meds as discussed. Further disposition pending results of labs. Over 30 minutes of exam, counseling, chart review, and critical decision making was performed  Future Appointments Date Time Provider [place]  01/29/2016 2:30 PM Vicie Mutters, PA-C [id number] None  07/29/2016 10:00 AM Vicie Mutters, PA-C [facility] None     HPI 60 y.o. female  presents for 3 month follow up on hypertension, cholesterol, prediabetes, and vitamin D deficiency.  She has been seeing [facility] for left shoulder pain x 04/27, she has had 2 injections, xrays, seeing PT and chiropractor and states that it is not helping. Mobic does not help. She states ROM is still not good. he has pain down her arm, to her neck, and Her blood pressure has been controlled at home, today their BP is BP: 130/62  She does not workout. She denies chest pain, shortness of breath, dizziness.  She is not on cholesterol medication and denies myalgias. Her cholesterol is at goal. The cholesterol last visit was:   Lab Results  Component Value Date   CHOL 210 (H) 07/26/2015   HDL 60 07/26/2015   LDLCALC 122 07/26/2015   TRIG 139 07/26/2015   CHOLHDL 3.5 07/26/2015    She has been working on diet and exercise for prediabetes, and denies paresthesia of the feet, polydipsia,  polyuria and visual disturbances. Last A1C in the office was:  Lab Results  Component Value Date   HGBA1C 5.9 (H) 07/26/2015   Patient is on Vitamin D supplement.   Lab Results  Component Value Date   VD25OH 28 (L) 07/26/2015     She is on thyroid medication. Her medication was not changed last visit.   Lab Results  Component Value Date   TSH 0.52 07/26/2015  .    Current Medications:  Current Outpatient Prescriptions on File Prior to Visit  Medication Sig Dispense Refill  . b complex vitamins tablet Take 1 tablet by mouth daily.    . calcium-vitamin D (OSCAL WITH D) 250-125 MG-UNIT per tablet Take 1 tablet by mouth daily.    . Coenzyme Q10 (COQ10 PO) Take 1 tablet by mouth 2 (two) times daily.    Marland Kitchen estradiol (VIVELLE-DOT) 0.05 MG/24HR patch Place 1 patch onto the skin 2 (two) times a week.    . Flaxseed, Linseed, (FLAX SEED OIL PO) Take 2 capsules by mouth 2 (two) times daily.    . IRON PO Take 1 tablet by mouth daily.    Marland Kitchen levothyroxine (SYNTHROID, LEVOTHROID) 112 MCG tablet TAKE 1 TABLET ON EMPTY STOMACH WITH SIP OF WATER IN AM.WAIT 30MINS TO AN HOUR BEFORE EATING 90 tablet 1  . MAGNESIUM PO Take 1 tablet by mouth daily.    . meloxicam (MOBIC) 15 MG tablet Take 1 tablet (15 mg total) by mouth daily. 30 tablet 2  . metaxalone (SKELAXIN) 800 MG  tablet Take 1 tablet (800 mg total) by mouth 3 (three) times daily. 30 tablet 1  . Multiple Vitamin (MULTIVITAMIN) capsule Take 1 capsule by mouth daily.    . Omega-3 Fatty Acids (FISH OIL PO) Take 2 capsules by mouth 2 (two) times daily.    . Red Yeast Rice Extract (RED YEAST RICE PO) Take 2 tablets by mouth 2 (two) times daily.     No current facility-administered medications on file prior to visit.    Medical History:  Past Medical History:  Diagnosis Date  . Colon polyp 12/29/2006  . Hyperlipidemia   . Hypothyroidism   . Obesity    BMI 34  . Osteoporosis   . Palpitations   . Vitamin D deficiency    Allergies:  Allergies   Allergen Reactions  . Ppd [Tuberculin Purified Protein Derivative]     Positive reaction.  Negative Chest Xray 06/16/12     Review of Systems:  Review of Systems  Constitutional: Positive for malaise/fatigue. Negative for chills, fever and weight loss.  HENT: Negative for congestion, ear pain and sore throat.   Eyes: Negative.   Respiratory: Negative for cough, shortness of breath and wheezing.   Cardiovascular: Negative for chest pain, palpitations and leg swelling.  Gastrointestinal: Negative for blood in stool, constipation, diarrhea, heartburn, melena, nausea and vomiting.  Genitourinary: Negative.   Musculoskeletal: Positive for joint pain and neck pain.  Skin: Negative for itching.  Neurological: Negative for dizziness, tingling, sensory change, loss of consciousness and headaches.  Psychiatric/Behavioral: Negative for depression. The patient is not nervous/anxious and does not have insomnia.     Family history- Review and unchanged Social history- Review and unchanged Physical Exam: BP 130/62   Pulse (!) 104   Temp 97.7 F (36.5 C)   Resp 16   Ht 5' (1.524 m)   Wt 200 lb (90.7 kg)   SpO2 99%   BMI 39.06 kg/m  Wt Readings from Last 3 Encounters:  11/16/15 200 lb (90.7 kg)  09/04/15 197 lb 1.6 oz (89.4 kg)  08/22/15 198 lb (89.8 kg)   General Appearance: Well nourished, in no apparent distress. Eyes: PERRLA, EOMs, conjunctiva no swelling or erythema Sinuses: No Frontal/maxillary tenderness ENT/Mouth: Ext aud canals clear, TMs without erythema, bulging. No erythema, swelling, or exudate on post pharynx.  Tonsils not swollen or erythematous. Hearing normal.  Neck: Supple, thyroid normal.  Respiratory: Respiratory effort normal, BS equal bilaterally without rales, rhonchi, wheezing or stridor.  Cardio: RRR with no MRGs. Brisk peripheral pulses without edema.  Abdomen: Soft, + BS,  Non tender, no guarding, rebound, hernias, masses. Lymphatics: Non tender without  lymphadenopathy.  Musculoskeletal: Full ROM, 5/5 strength, Normal gait,  She has several decreased external rotation, some limited internal rotation, abduction to 160.  Skin: Warm, dry without rashes, lesions, ecchymosis.  Neuro: Cranial nerves intact. Normal muscle tone, no cerebellar symptoms. Psych: Awake and oriented X 3, normal affect, Insight and Judgment appropriate.    Vicie Mutters, PA-C 4:03 PM [facility]

## 2015-11-16 NOTE — Patient Instructions (Signed)
Try the lyrica, if this does not help or you have side effects please call the office and will send in amitriptyline low dose Please call ortho and set up appointment  Can take the lyrica samples for nerve pain. It can make you sleepy so we suggest trying it at night first and please plan to not drive or do anything strenuous. Also please do not take this medication with alcohol.  Start out 1 pill at night before bed, can increase to 2 pills at night before bed. Please call the office if you have any side effects.   Can take 3 pills a day however you would like  Some examples: - 1 breakfast, lunch, bedtime. - 1 at breakfast, 2 at bed time  How should I use this medicine? Take this medicine by mouth with a glass of water. Follow the directions on the prescription label. You can take this medicine with or without food. Take your doses at regular intervals. Do not take your medicine more often than directed. Do not stop taking except on your doctor's advice.  What if I miss a dose? If you miss a dose, take it as soon as you can. If it is almost time for your next dose, take only that dose. Do not take double or extra doses.  What should I watch for while using this medicine? Tell your doctor or healthcare professional if your symptoms do not start to get better or if they get worse.   You may get drowsy or dizzy. Do not drive, use machinery, or do anything that needs mental alertness until you know how this medicine affects you. Do not stand or sit up quickly, especially if you are an older patient. This reduces the risk of dizzy or fainting spells. Alcohol may interfere with the effect of this medicine. Avoid alcoholic drinks. If you have a heart condition, like congestive heart failure, and notice that you are retaining water and have swelling in your hands or feet, contact your health care provider immediately.  What side effects may I notice from receiving this medicine? Side effects that you  should report to your doctor or health care professional as soon as possible and are very rare: -allergic reactions like skin rash, itching or hives, swelling of the face, lips, or tongue -breathing problems -changes in vision -jerking or unusual movements of any part of your body -suicidal thoughts or other mood changes -swelling of the ankles, feet, hands -unusual bruising or bleeding  Side effects that usually do not require medical attention (Report these to your doctor or health care professional if they continue or are bothersome.): -dizziness -drowsiness -dry mouth -nausea -tremors     Impingement Syndrome, Rotator Cuff, Bursitis With Rehab Impingement syndrome is a condition that involves inflammation of the tendons of the rotator cuff and the subacromial bursa, that causes pain in the shoulder. The rotator cuff consists of four tendons and muscles that control much of the shoulder and upper arm function. The subacromial bursa is a fluid filled sac that helps reduce friction between the rotator cuff and one of the bones of the shoulder (acromion). Impingement syndrome is usually an overuse injury that causes swelling of the bursa (bursitis), swelling of the tendon (tendonitis), and/or a tear of the tendon (strain). Strains are classified into three categories. Grade 1 strains cause pain, but the tendon is not lengthened. Grade 2 strains include a lengthened ligament, due to the ligament being stretched or partially ruptured. With grade 2  strains there is still function, although the function may be decreased. Grade 3 strains include a complete tear of the tendon or muscle, and function is usually impaired. SYMPTOMS   Pain around the shoulder, often at the outer portion of the upper arm.  Pain that gets worse with shoulder function, especially when reaching overhead or lifting.  Sometimes, aching when not using the arm.  Pain that wakes you up at night.  Sometimes, tenderness,  swelling, warmth, or redness over the affected area.  Loss of strength.  Limited motion of the shoulder, especially reaching behind the back (to the back pocket or to unhook bra) or across your body.  Crackling sound (crepitation) when moving the arm.  Biceps tendon pain and inflammation (in the front of the shoulder). Worse when bending the elbow or lifting. CAUSES  Impingement syndrome is often an overuse injury, in which chronic (repetitive) motions cause the tendons or bursa to become inflamed. A strain occurs when a force is paced on the tendon or muscle that is greater than it can withstand. Common mechanisms of injury include: Stress from sudden increase in duration, frequency, or intensity of training.  Direct hit (trauma) to the shoulder.  Aging, erosion of the tendon with normal use.  Bony bump on shoulder (acromial spur). RISK INCREASES WITH:  Contact sports (football, wrestling, boxing).  Throwing sports (baseball, tennis, volleyball).  Weightlifting and bodybuilding.  Heavy labor.  Previous injury to the rotator cuff, including impingement.  Poor shoulder strength and flexibility.  Failure to warm up properly before activity.  Inadequate protective equipment.  Old age.  Bony bump on shoulder (acromial spur). PREVENTION   Warm up and stretch properly before activity.  Allow for adequate recovery between workouts.  Maintain physical fitness:  Strength, flexibility, and endurance.  Cardiovascular fitness.  Learn and use proper exercise technique. PROGNOSIS  If treated properly, impingement syndrome usually goes away within 6 weeks. Sometimes surgery is required.  RELATED COMPLICATIONS   Longer healing time if not properly treated, or if not given enough time to heal.  Recurring symptoms, that result in a chronic condition.  Shoulder stiffness, frozen shoulder, or loss of motion.  Rotator cuff tendon tear.  Recurring symptoms, especially if  activity is resumed too soon, with overuse, with a direct blow, or when using poor technique. TREATMENT  Treatment first involves the use of ice and medicine, to reduce pain and inflammation. The use of strengthening and stretching exercises may help reduce pain with activity. These exercises may be performed at home or with a therapist. If non-surgical treatment is unsuccessful after more than 6 months, surgery may be advised. After surgery and rehabilitation, activity is usually possible in 3 months.  MEDICATION  If pain medicine is needed, nonsteroidal anti-inflammatory medicines (aspirin and ibuprofen), or other minor pain relievers (acetaminophen), are often advised.  Do not take pain medicine for 7 days before surgery.  Prescription pain relievers may be given, if your caregiver thinks they are needed. Use only as directed and only as much as you need.  Corticosteroid injections may be given by your caregiver. These injections should be reserved for the most serious cases, because they may only be given a certain number of times. HEAT AND COLD  Cold treatment (icing) should be applied for 10 to 15 minutes every 2 to 3 hours for inflammation and pain, and immediately after activity that aggravates your symptoms. Use ice packs or an ice massage.  Heat treatment may be used before performing stretching  and strengthening activities prescribed by your caregiver, physical therapist, or athletic trainer. Use a heat pack or a warm water soak. SEEK MEDICAL CARE IF:   Symptoms get worse or do not improve in 4 to 6 weeks, despite treatment.  New, unexplained symptoms develop. (Drugs used in treatment may produce side effects.) EXERCISES  RANGE OF MOTION (ROM) AND STRETCHING EXERCISES - Impingement Syndrome (Rotator Cuff  Tendinitis, Bursitis) These exercises may help you when beginning to rehabilitate your injury. Your symptoms may go away with or without further involvement from your physician,  physical therapist or athletic trainer. While completing these exercises, remember:   Restoring tissue flexibility helps normal motion to return to the joints. This allows healthier, less painful movement and activity.  An effective stretch should be held for at least 30 seconds.  A stretch should never be painful. You should only feel a gentle lengthening or release in the stretched tissue. STRETCH - Flexion, Standing  Stand with good posture. With an underhand grip on your right / left hand, and an overhand grip on the opposite hand, grasp a broomstick or cane so that your hands are a little more than shoulder width apart.  Keeping your right / left elbow straight and shoulder muscles relaxed, push the stick with your opposite hand, to raise your right / left arm in front of your body and then overhead. Raise your arm until you feel a stretch in your right / left shoulder, but before you have increased shoulder pain.  Try to avoid shrugging your right / left shoulder as your arm rises, by keeping your shoulder blade tucked down and toward your mid-back spine. Hold for __________ seconds.  Slowly return to the starting position. Repeat __________ times. Complete this exercise __________ times per day. STRETCH - Abduction, Supine  Lie on your back. With an underhand grip on your right / left hand and an overhand grip on the opposite hand, grasp a broomstick or cane so that your hands are a little more than shoulder width apart.  Keeping your right / left elbow straight and your shoulder muscles relaxed, push the stick with your opposite hand, to raise your right / left arm out to the side of your body and then overhead. Raise your arm until you feel a stretch in your right / left shoulder, but before you have increased shoulder pain.  Try to avoid shrugging your right / left shoulder as your arm rises, by keeping your shoulder blade tucked down and toward your mid-back spine. Hold for  __________ seconds.  Slowly return to the starting position. Repeat __________ times. Complete this exercise __________ times per day. ROM - Flexion, Active-Assisted  Lie on your back. You may bend your knees for comfort.  Grasp a broomstick or cane so your hands are about shoulder width apart. Your right / left hand should grip the end of the stick, so that your hand is positioned "thumbs-up," as if you were about to shake hands.  Using your healthy arm to lead, raise your right / left arm overhead, until you feel a gentle stretch in your shoulder. Hold for __________ seconds.  Use the stick to assist in returning your right / left arm to its starting position. Repeat __________ times. Complete this exercise __________ times per day.  ROM - Internal Rotation, Supine   Lie on your back on a firm surface. Place your right / left elbow about 60 degrees away from your side. Elevate your elbow with a  folded towel, so that the elbow and shoulder are the same height.  Using a broomstick or cane and your strong arm, pull your right / left hand toward your body until you feel a gentle stretch, but no increase in your shoulder pain. Keep your shoulder and elbow in place throughout the exercise.  Hold for __________ seconds. Slowly return to the starting position. Repeat __________ times. Complete this exercise __________ times per day. STRETCH - Internal Rotation  Place your right / left hand behind your back, palm up.  Throw a towel or belt over your opposite shoulder. Grasp the towel with your right / left hand.  While keeping an upright posture, gently pull up on the towel, until you feel a stretch in the front of your right / left shoulder.  Avoid shrugging your right / left shoulder as your arm rises, by keeping your shoulder blade tucked down and toward your mid-back spine.  Hold for __________ seconds. Release the stretch, by lowering your healthy hand. Repeat __________ times.  Complete this exercise __________ times per day. ROM - Internal Rotation   Using an underhand grip, grasp a stick behind your back with both hands.  While standing upright with good posture, slide the stick up your back until you feel a mild stretch in the front of your shoulder.  Hold for __________ seconds. Slowly return to your starting position. Repeat __________ times. Complete this exercise __________ times per day.  STRETCH - Posterior Shoulder Capsule   Stand or sit with good posture. Grasp your right / left elbow and draw it across your chest, keeping it at the same height as your shoulder.  Pull your elbow, so your upper arm comes in closer to your chest. Pull until you feel a gentle stretch in the back of your shoulder.  Hold for __________ seconds. Repeat __________ times. Complete this exercise __________ times per day. STRENGTHENING EXERCISES - Impingement Syndrome (Rotator Cuff Tendinitis, Bursitis) These exercises may help you when beginning to rehabilitate your injury. They may resolve your symptoms with or without further involvement from your physician, physical therapist or athletic trainer. While completing these exercises, remember:  Muscles can gain both the endurance and the strength needed for everyday activities through controlled exercises.  Complete these exercises as instructed by your physician, physical therapist or athletic trainer. Increase the resistance and repetitions only as guided.  You may experience muscle soreness or fatigue, but the pain or discomfort you are trying to eliminate should never worsen during these exercises. If this pain does get worse, stop and make sure you are following the directions exactly. If the pain is still present after adjustments, discontinue the exercise until you can discuss the trouble with your clinician.  During your recovery, avoid activity or exercises which involve actions that place your injured hand or elbow  above your head or behind your back or head. These positions stress the tissues which you are trying to heal. STRENGTH - Scapular Depression and Adduction   With good posture, sit on a firm chair. Support your arms in front of you, with pillows, arm rests, or on a table top. Have your elbows in line with the sides of your body.  Gently draw your shoulder blades down and toward your mid-back spine. Gradually increase the tension, without tensing the muscles along the top of your shoulders and the back of your neck.  Hold for __________ seconds. Slowly release the tension and relax your muscles completely before starting the next  repetition.  After you have practiced this exercise, remove the arm support and complete the exercise in standing as well as sitting position. Repeat __________ times. Complete this exercise __________ times per day.  STRENGTH - Shoulder Abductors, Isometric  With good posture, stand or sit about 4-6 inches from a wall, with your right / left side facing the wall.  Bend your right / left elbow. Gently press your right / left elbow into the wall. Increase the pressure gradually, until you are pressing as hard as you can, without shrugging your shoulder or increasing any shoulder discomfort.  Hold for __________ seconds.  Release the tension slowly. Relax your shoulder muscles completely before you begin the next repetition. Repeat __________ times. Complete this exercise __________ times per day.  STRENGTH - External Rotators, Isometric  Keep your right / left elbow at your side and bend it 90 degrees.  Step into a door frame so that the outside of your right / left wrist can press against the door frame without your upper arm leaving your side.  Gently press your right / left wrist into the door frame, as if you were trying to swing the back of your hand away from your stomach. Gradually increase the tension, until you are pressing as hard as you can, without  shrugging your shoulder or increasing any shoulder discomfort.  Hold for __________ seconds.  Release the tension slowly. Relax your shoulder muscles completely before you begin the next repetition. Repeat __________ times. Complete this exercise __________ times per day.  STRENGTH - Supraspinatus   Stand or sit with good posture. Grasp a __________ weight, or an exercise band or tubing, so that your hand is "thumbs-up," like you are shaking hands.  Slowly lift your right / left arm in a "V" away from your thigh, diagonally into the space between your side and straight ahead. Lift your hand to shoulder height or as far as you can, without increasing any shoulder pain. At first, many people do not lift their hands above shoulder height.  Avoid shrugging your right / left shoulder as your arm rises, by keeping your shoulder blade tucked down and toward your mid-back spine.  Hold for __________ seconds. Control the descent of your hand, as you slowly return to your starting position. Repeat __________ times. Complete this exercise __________ times per day.  STRENGTH - External Rotators  Secure a rubber exercise band or tubing to a fixed object (table, pole) so that it is at the same height as your right / left elbow when you are standing or sitting on a firm surface.  Stand or sit so that the secured exercise band is at your uninjured side.  Bend your right / left elbow 90 degrees. Place a folded towel or small pillow under your right / left arm, so that your elbow is a few inches away from your side.  Keeping the tension on the exercise band, pull it away from your body, as if pivoting on your elbow. Be sure to keep your body steady, so that the movement is coming only from your rotating shoulder.  Hold for __________ seconds. Release the tension in a controlled manner, as you return to the starting position. Repeat __________ times. Complete this exercise __________ times per day.    STRENGTH - Internal Rotators   Secure a rubber exercise band or tubing to a fixed object (table, pole) so that it is at the same height as your right / left elbow when you  are standing or sitting on a firm surface.  Stand or sit so that the secured exercise band is at your right / left side.  Bend your elbow 90 degrees. Place a folded towel or small pillow under your right / left arm so that your elbow is a few inches away from your side.  Keeping the tension on the exercise band, pull it across your body, toward your stomach. Be sure to keep your body steady, so that the movement is coming only from your rotating shoulder.  Hold for __________ seconds. Release the tension in a controlled manner, as you return to the starting position. Repeat __________ times. Complete this exercise __________ times per day.  STRENGTH - Scapular Protractors, Standing   Stand arms length away from a wall. Place your hands on the wall, keeping your elbows straight.  Begin by dropping your shoulder blades down and toward your mid-back spine.  To strengthen your protractors, keep your shoulder blades down, but slide them forward on your rib cage. It will feel as if you are lifting the back of your rib cage away from the wall. This is a subtle motion and can be challenging to complete. Ask your caregiver for further instruction, if you are not sure you are doing the exercise correctly.  Hold for __________ seconds. Slowly return to the starting position, resting the muscles completely before starting the next repetition. Repeat __________ times. Complete this exercise __________ times per day. STRENGTH - Scapular Protractors, Supine  Lie on your back on a firm surface. Extend your right / left arm straight into the air while holding a __________ weight in your hand.  Keeping your head and back in place, lift your shoulder off the floor.  Hold for __________ seconds. Slowly return to the starting position,  and allow your muscles to relax completely before starting the next repetition. Repeat __________ times. Complete this exercise __________ times per day. STRENGTH - Scapular Protractors, Quadruped  Get onto your hands and knees, with your shoulders directly over your hands (or as close as you can be, comfortably).  Keeping your elbows locked, lift the back of your rib cage up into your shoulder blades, so your mid-back rounds out. Keep your neck muscles relaxed.  Hold this position for __________ seconds. Slowly return to the starting position and allow your muscles to relax completely before starting the next repetition. Repeat __________ times. Complete this exercise __________ times per day.  STRENGTH - Scapular Retractors  Secure a rubber exercise band or tubing to a fixed object (table, pole), so that it is at the height of your shoulders when you are either standing, or sitting on a firm armless chair.  With a palm down grip, grasp an end of the band in each hand. Straighten your elbows and lift your hands straight in front of you, at shoulder height. Step back, away from the secured end of the band, until it becomes tense.  Squeezing your shoulder blades together, draw your elbows back toward your sides, as you bend them. Keep your upper arms lifted away from your body throughout the exercise.  Hold for __________ seconds. Slowly ease the tension on the band, as you reverse the directions and return to the starting position. Repeat __________ times. Complete this exercise __________ times per day. STRENGTH - Shoulder Extensors   Secure a rubber exercise band or tubing to a fixed object (table, pole) so that it is at the height of your shoulders when you are either  standing, or sitting on a firm armless chair.  With a thumbs-up grip, grasp an end of the band in each hand. Straighten your elbows and lift your hands straight in front of you, at shoulder height. Step back, away from the  secured end of the band, until it becomes tense.  Squeezing your shoulder blades together, pull your hands down to the sides of your thighs. Do not allow your hands to go behind you.  Hold for __________ seconds. Slowly ease the tension on the band, as you reverse the directions and return to the starting position. Repeat __________ times. Complete this exercise __________ times per day.  STRENGTH - Scapular Retractors and External Rotators   Secure a rubber exercise band or tubing to a fixed object (table, pole) so that it is at the height as your shoulders, when you are either standing, or sitting on a firm armless chair.  With a palm down grip, grasp an end of the band in each hand. Bend your elbows 90 degrees and lift your elbows to shoulder height, at your sides. Step back, away from the secured end of the band, until it becomes tense.  Squeezing your shoulder blades together, rotate your shoulders so that your upper arms and elbows remain stationary, but your fists travel upward to head height.  Hold for __________ seconds. Slowly ease the tension on the band, as you reverse the directions and return to the starting position. Repeat __________ times. Complete this exercise __________ times per day.  STRENGTH - Scapular Retractors and External Rotators, Rowing   Secure a rubber exercise band or tubing to a fixed object (table, pole) so that it is at the height of your shoulders, when you are either standing, or sitting on a firm armless chair.  With a palm down grip, grasp an end of the band in each hand. Straighten your elbows and lift your hands straight in front of you, at shoulder height. Step back, away from the secured end of the band, until it becomes tense.  Step 1: Squeeze your shoulder blades together. Bending your elbows, draw your hands to your chest, as if you are rowing a boat. At the end of this motion, your hands and elbow should be at shoulder height and your elbows should  be out to your sides.  Step 2: Rotate your shoulders, to raise your hands above your head. Your forearms should be vertical and your upper arms should be horizontal.  Hold for __________ seconds. Slowly ease the tension on the band, as you reverse the directions and return to the starting position. Repeat __________ times. Complete this exercise __________ times per day.  STRENGTH - Scapular Depressors  Find a sturdy chair without wheels, such as a dining room chair.  Keeping your feet on the floor, and your hands on the chair arms, lift your bottom up from the seat, and lock your elbows.  Keeping your elbows straight, allow gravity to pull your body weight down. Your shoulders will rise toward your ears.  Raise your body against gravity by drawing your shoulder blades down your back, shortening the distance between your shoulders and ears. Although your feet should always maintain contact with the floor, your feet should progressively support less body weight, as you get stronger.  Hold for __________ seconds. In a controlled and slow manner, lower your body weight to begin the next repetition. Repeat __________ times. Complete this exercise __________ times per day.    This information is not intended  to replace advice given to you by your health care provider. Make sure you discuss any questions you have with your health care provider.   Document Released: 12/24/2004 Document Revised: 01/14/2014 Document Reviewed: 04/07/2008 Elsevier Interactive Patient Education [employer].

## 2015-11-20 DIAGNOSIS — M7502 Adhesive capsulitis of left shoulder: Secondary | ICD-10-CM | POA: Diagnosis not present

## 2015-11-27 ENCOUNTER — Other Ambulatory Visit: Payer: Self-pay | Admitting: Physician Assistant

## 2015-11-27 DIAGNOSIS — M25512 Pain in left shoulder: Secondary | ICD-10-CM | POA: Diagnosis not present

## 2015-11-27 DIAGNOSIS — M25612 Stiffness of left shoulder, not elsewhere classified: Secondary | ICD-10-CM | POA: Diagnosis not present

## 2015-11-27 DIAGNOSIS — M7502 Adhesive capsulitis of left shoulder: Secondary | ICD-10-CM | POA: Diagnosis not present

## 2015-11-27 MED ORDER — GABAPENTIN 300 MG PO CAPS: ORAL_CAPSULE | ORAL | 2 refills | Status: DC

## 2015-12-08 DIAGNOSIS — M25612 Stiffness of left shoulder, not elsewhere classified: Secondary | ICD-10-CM | POA: Diagnosis not present

## 2015-12-08 DIAGNOSIS — M25512 Pain in left shoulder: Secondary | ICD-10-CM | POA: Diagnosis not present

## 2015-12-08 DIAGNOSIS — M7502 Adhesive capsulitis of left shoulder: Secondary | ICD-10-CM | POA: Diagnosis not present

## 2015-12-12 ENCOUNTER — Other Ambulatory Visit: Payer: Self-pay | Admitting: *Deleted

## 2015-12-12 DIAGNOSIS — M7502 Adhesive capsulitis of left shoulder: Secondary | ICD-10-CM | POA: Diagnosis not present

## 2015-12-12 DIAGNOSIS — Z1212 Encounter for screening for malignant neoplasm of rectum: Secondary | ICD-10-CM

## 2015-12-12 DIAGNOSIS — M25512 Pain in left shoulder: Secondary | ICD-10-CM | POA: Diagnosis not present

## 2015-12-12 DIAGNOSIS — M25612 Stiffness of left shoulder, not elsewhere classified: Secondary | ICD-10-CM | POA: Diagnosis not present

## 2015-12-12 LAB — POC HEMOCCULT BLD/STL (HOME/3-CARD/SCREEN)
Card #2 Fecal Occult Blod, POC: NEGATIVE
FECAL OCCULT BLD: NEGATIVE
Fecal Occult Blood, POC: NEGATIVE

## 2015-12-14 ENCOUNTER — Other Ambulatory Visit: Payer: Self-pay | Admitting: Physician Assistant

## 2015-12-14 DIAGNOSIS — M7502 Adhesive capsulitis of left shoulder: Secondary | ICD-10-CM | POA: Diagnosis not present

## 2015-12-14 DIAGNOSIS — M25512 Pain in left shoulder: Secondary | ICD-10-CM | POA: Diagnosis not present

## 2015-12-14 DIAGNOSIS — M25612 Stiffness of left shoulder, not elsewhere classified: Secondary | ICD-10-CM | POA: Diagnosis not present

## 2015-12-14 MED ORDER — PREGABALIN 50 MG PO CAPS: 50.0000 mg | ORAL_CAPSULE | Freq: Three times a day (TID) | ORAL | 2 refills | Status: DC

## 2015-12-18 DIAGNOSIS — M7502 Adhesive capsulitis of left shoulder: Secondary | ICD-10-CM | POA: Diagnosis not present

## 2015-12-18 DIAGNOSIS — M25612 Stiffness of left shoulder, not elsewhere classified: Secondary | ICD-10-CM | POA: Diagnosis not present

## 2015-12-18 DIAGNOSIS — M25512 Pain in left shoulder: Secondary | ICD-10-CM | POA: Diagnosis not present

## 2015-12-25 DIAGNOSIS — M25512 Pain in left shoulder: Secondary | ICD-10-CM | POA: Diagnosis not present

## 2015-12-25 DIAGNOSIS — M7502 Adhesive capsulitis of left shoulder: Secondary | ICD-10-CM | POA: Diagnosis not present

## 2015-12-25 DIAGNOSIS — M25612 Stiffness of left shoulder, not elsewhere classified: Secondary | ICD-10-CM | POA: Diagnosis not present

## 2016-01-09 DIAGNOSIS — M7502 Adhesive capsulitis of left shoulder: Secondary | ICD-10-CM | POA: Diagnosis not present

## 2016-01-09 DIAGNOSIS — M25512 Pain in left shoulder: Secondary | ICD-10-CM | POA: Diagnosis not present

## 2016-01-09 DIAGNOSIS — M25612 Stiffness of left shoulder, not elsewhere classified: Secondary | ICD-10-CM | POA: Diagnosis not present

## 2016-01-15 DIAGNOSIS — M7502 Adhesive capsulitis of left shoulder: Secondary | ICD-10-CM | POA: Diagnosis not present

## 2016-01-15 DIAGNOSIS — M25512 Pain in left shoulder: Secondary | ICD-10-CM | POA: Diagnosis not present

## 2016-01-15 DIAGNOSIS — M25612 Stiffness of left shoulder, not elsewhere classified: Secondary | ICD-10-CM | POA: Diagnosis not present

## 2016-01-22 DIAGNOSIS — M25512 Pain in left shoulder: Secondary | ICD-10-CM | POA: Diagnosis not present

## 2016-01-22 DIAGNOSIS — M25612 Stiffness of left shoulder, not elsewhere classified: Secondary | ICD-10-CM | POA: Diagnosis not present

## 2016-01-22 DIAGNOSIS — M7502 Adhesive capsulitis of left shoulder: Secondary | ICD-10-CM | POA: Diagnosis not present

## 2016-01-29 ENCOUNTER — Ambulatory Visit (INDEPENDENT_AMBULATORY_CARE_PROVIDER_SITE_OTHER): Payer: BLUE CROSS/BLUE SHIELD | Admitting: Physician Assistant

## 2016-01-29 VITALS — BP 120/78 | HR 93 | Temp 98.2°F | Resp 16 | Ht 60.0 in | Wt 201.2 lb

## 2016-01-29 DIAGNOSIS — E039 Hypothyroidism, unspecified: Secondary | ICD-10-CM | POA: Diagnosis not present

## 2016-01-29 DIAGNOSIS — E785 Hyperlipidemia, unspecified: Secondary | ICD-10-CM

## 2016-01-29 DIAGNOSIS — R030 Elevated blood-pressure reading, without diagnosis of hypertension: Secondary | ICD-10-CM

## 2016-01-29 DIAGNOSIS — R7303 Prediabetes: Secondary | ICD-10-CM | POA: Diagnosis not present

## 2016-01-29 DIAGNOSIS — Z79899 Other long term (current) drug therapy: Secondary | ICD-10-CM

## 2016-01-29 DIAGNOSIS — Z23 Encounter for immunization: Secondary | ICD-10-CM | POA: Diagnosis not present

## 2016-01-29 DIAGNOSIS — E559 Vitamin D deficiency, unspecified: Secondary | ICD-10-CM

## 2016-01-29 LAB — CBC WITH DIFFERENTIAL/PLATELET
BASOS ABS: 0 {cells}/uL (ref 0–200)
Basophils Relative: 0 %
EOS ABS: 72 {cells}/uL (ref 15–500)
EOS PCT: 1 %
HEMATOCRIT: 40.5 % (ref 35.0–45.0)
HEMOGLOBIN: 13 g/dL (ref 11.7–15.5)
LYMPHS ABS: 2304 {cells}/uL (ref 850–3900)
Lymphocytes Relative: 32 %
MCH: 25 pg — AB (ref 27.0–33.0)
MCHC: 32.1 g/dL (ref 32.0–36.0)
MCV: 78 fL — AB (ref 80.0–100.0)
MPV: 9.9 fL (ref 7.5–12.5)
Monocytes Absolute: 720 cells/uL (ref 200–950)
Monocytes Relative: 10 %
Neutro Abs: 4104 cells/uL (ref 1500–7800)
Neutrophils Relative %: 57 %
Platelets: 268 10*3/uL (ref 140–400)
RBC: 5.19 MIL/uL — ABNORMAL HIGH (ref 3.80–5.10)
RDW: 17.8 % — ABNORMAL HIGH (ref 11.0–15.0)
WBC: 7.2 10*3/uL (ref 3.8–10.8)

## 2016-01-29 LAB — TSH: TSH: 0.58 m[IU]/L

## 2016-01-29 MED ORDER — LEVOTHYROXINE SODIUM 112 MCG PO TABS: ORAL_TABLET | ORAL | 1 refills | Status: DC

## 2016-01-29 NOTE — Addendum Note (Signed)
Addended by: Elsie Amis D on: 01/29/2016 03:22 PM   Modules accepted: Orders

## 2016-01-29 NOTE — Patient Instructions (Signed)
Simple math prevails.    1st - exercise does not produce significant weight loss - at best one converts fat into muscle , "bulks up", loses inches, but usually stays "weight neutral"     2nd - think of your body weightas a check book: If you eat more calories than you burn up - you save money or gain weight .... Or if you spend more money than you put in the check book, ie burn up more calories than you eat, then you lose weight     3rd - if you walk or run 1 mile, you burn up 100 calories - you have to burn up 3,500 calories to lose 1 pound, ie you have to walk/run 35 miles to lose 1 measly pound. So if you want to lose 10 #, then you have to walk/run 350 miles, so.... clearly exercise is not the solution.     4. So if you consume 1,500 calories, then you have to burn up the equivalent of 15 miles to stay weight neutral - It also stands to reason that if you consume 1,500 cal/day and don't lose weight, then you must be burning up about 1,500 cals/day to stay weight neutral.     5. If you really want to lose weight, you must cut your calorie intake 300 calories /day and at that rate you should lose about 1 # every 3 days.   6. Please purchase Dr Joel Fuhrman's book(s) "The End of Dieting" & "Eat to Live" . It has some great concepts and recipes.        Bad carbs also include fruit juice, alcohol, and sweet tea. These are empty calories that do not signal to your brain that you are full.   Please remember the good carbs are still carbs which convert into sugar. So please measure them out no more than 1/2-1 cup of rice, oatmeal, pasta, and beans  Veggies are however free foods! Pile them on.   Not all fruit is created equal. Please see the list below, the fruit at the bottom is higher in sugars than the fruit at the top. Please avoid all dried fruits.     

## 2016-01-29 NOTE — Progress Notes (Signed)
Assessment and Plan:   Hypertension -Continue medication, monitor blood pressure at home. Continue DASH diet.  Reminder to go to the ER if any CP, SOB, nausea, dizziness, severe HA, changes vision/speech, left arm numbness and tingling and jaw pain.  Cholesterol -Continue diet and exercise. Check cholesterol.    Prediabetes  -Continue diet and exercise. Check A1C  Vitamin D Def - check level and continue medications.   Morbid Obesity with co morbidities - long discussion about weight loss, diet, and exercise  Continue diet and meds as discussed. Further disposition pending results of labs. Over 30 minutes of exam, counseling, chart review, and critical decision making was performed  Future Appointments Date Time Provider [place]  01/29/2016 2:30 PM Vicie Mutters, PA-C [facility] None  07/29/2016 10:00 AM Vicie Mutters, PA-C [facility] None     HPI 61 y.o. female  presents for 3 month follow up on hypertension, cholesterol, prediabetes, and vitamin D deficiency.  She has been seeing [facility] AND PT for her neck/shoulder pain, doing better, on lyrica during the day and gabapentin but has been doing better with PT.  Her blood pressure has been controlled at home, today their BP is    She does not workout. She denies chest pain, shortness of breath, dizziness.  She is not on cholesterol medication and denies myalgias. Her cholesterol is at goal. The cholesterol last visit was:   Lab Results  Component Value Date   CHOL 210 (H) 07/26/2015   HDL 60 07/26/2015   LDLCALC 122 07/26/2015   TRIG 139 07/26/2015   CHOLHDL 3.5 07/26/2015    She has been working on diet and exercise for prediabetes, and denies paresthesia of the feet, polydipsia, polyuria and visual disturbances. Last A1C in the office was:  Lab Results  Component Value Date   HGBA1C 5.9 (H) 07/26/2015   Patient is on Vitamin D supplement.   Lab Results  Component Value Date   VD25OH 28 (L) 07/26/2015      She is on thyroid medication. Her medication was not changed last visit.   Lab Results  Component Value Date   TSH 0.52 07/26/2015  .  BMI is Body mass index is 39.29 kg/m., she is working on diet and exercise. Wt Readings from Last 3 Encounters:  01/29/16 201 lb 3.2 oz (91.3 kg)  11/16/15 200 lb (90.7 kg)  09/04/15 197 lb 1.6 oz (89.4 kg)    Current Medications:  Current Outpatient Prescriptions on File Prior to Visit  Medication Sig Dispense Refill  . b complex vitamins tablet Take 1 tablet by mouth daily.    . calcium-vitamin D (OSCAL WITH D) 250-125 MG-UNIT per tablet Take 1 tablet by mouth daily.    . Coenzyme Q10 (COQ10 PO) Take 1 tablet by mouth 2 (two) times daily.    Marland Kitchen estradiol (VIVELLE-DOT) 0.05 MG/24HR patch Place 1 patch onto the skin 2 (two) times a week.    . Flaxseed, Linseed, (FLAX SEED OIL PO) Take 2 capsules by mouth 2 (two) times daily.    Marland Kitchen gabapentin (NEURONTIN) 300 MG capsule 1-2 pills at night for shoulder pain 60 capsule 2  . IRON PO Take 1 tablet by mouth daily.    Marland Kitchen levothyroxine (SYNTHROID, LEVOTHROID) 112 MCG tablet TAKE 1 TABLET ON EMPTY STOMACH WITH SIP OF WATER IN AM.WAIT 30MINS TO AN HOUR BEFORE EATING 90 tablet 1  . MAGNESIUM PO Take 1 tablet by mouth daily.    . meloxicam (MOBIC) 15 MG tablet Take  1 tablet (15 mg total) by mouth daily. 30 tablet 2  . metaxalone (SKELAXIN) 800 MG tablet Take 1 tablet (800 mg total) by mouth 3 (three) times daily. 30 tablet 1  . Multiple Vitamin (MULTIVITAMIN) capsule Take 1 capsule by mouth daily.    . Omega-3 Fatty Acids (FISH OIL PO) Take 2 capsules by mouth 2 (two) times daily.    . pregabalin (LYRICA) 50 MG capsule Take 1 capsule (50 mg total) by mouth 3 (three) times daily. 90 capsule 2  . Red Yeast Rice Extract (RED YEAST RICE PO) Take 2 tablets by mouth 2 (two) times daily.     No current facility-administered medications on file prior to visit.    Medical History:  Past Medical History:  Diagnosis  Date  . Colon polyp 12/29/2006  . Hyperlipidemia   . Hypothyroidism   . Obesity    BMI 34  . Osteoporosis   . Palpitations   . Vitamin D deficiency    Allergies:  Allergies  Allergen Reactions  . Ppd [Tuberculin Purified Protein Derivative]     Positive reaction.  Negative Chest Xray 06/16/12     Review of Systems:  Review of Systems  Constitutional: Negative for chills, fever, malaise/fatigue and weight loss.  HENT: Negative for congestion, ear pain and sore throat.   Eyes: Negative.   Respiratory: Negative for cough, shortness of breath and wheezing.   Cardiovascular: Negative for chest pain, palpitations and leg swelling.  Gastrointestinal: Negative for blood in stool, constipation, diarrhea, heartburn, melena, nausea and vomiting.  Genitourinary: Negative.   Musculoskeletal: Positive for neck pain (better with PT). Negative for joint pain.  Skin: Negative for itching.  Neurological: Negative for dizziness, tingling, sensory change, loss of consciousness and headaches.  Psychiatric/Behavioral: Negative for depression. The patient is not nervous/anxious and does not have insomnia.     Family history- Review and unchanged Social history- Review and unchanged Physical Exam: There were no vitals taken for this visit. Wt Readings from Last 3 Encounters:  11/16/15 200 lb (90.7 kg)  09/04/15 197 lb 1.6 oz (89.4 kg)  08/22/15 198 lb (89.8 kg)   General Appearance: Well nourished, in no apparent distress. Eyes: PERRLA, EOMs, conjunctiva no swelling or erythema Sinuses: No Frontal/maxillary tenderness ENT/Mouth: Ext aud canals clear, TMs without erythema, bulging. No erythema, swelling, or exudate on post pharynx.  Tonsils not swollen or erythematous. Hearing normal.  Neck: Supple, thyroid normal.  Respiratory: Respiratory effort normal, BS equal bilaterally without rales, rhonchi, wheezing or stridor.  Cardio: RRR with no MRGs. Brisk peripheral pulses without edema.  Abdomen:  Soft, + BS,  Non tender, no guarding, rebound, hernias, masses. Lymphatics: Non tender without lymphadenopathy.  Musculoskeletal: Full ROM, 5/5 strength, Normal gait Skin: Warm, dry without rashes, lesions, ecchymosis.  Neuro: Cranial nerves intact. Normal muscle tone, no cerebellar symptoms. Psych: Awake and oriented X 3, normal affect, Insight and Judgment appropriate.    Vicie Mutters, PA-C 11:56 AM [facility]

## 2016-01-30 LAB — BASIC METABOLIC PANEL WITH GFR
BUN: 12 mg/dL (ref 7–25)
CALCIUM: 9.7 mg/dL (ref 8.6–10.4)
CHLORIDE: 101 mmol/L (ref 98–110)
CO2: 29 mmol/L (ref 20–31)
CREATININE: 0.82 mg/dL (ref 0.50–0.99)
GFR, Est African American: >89 mL/min (ref >=60)
GFR, Est Non African American: 78 mL/min (ref >=60)
GLUCOSE: 79 mg/dL (ref 65–99)
Potassium: 4.2 mmol/L (ref 3.5–5.3)
Sodium: 140 mmol/L (ref 135–146)

## 2016-01-30 LAB — LIPID PANEL
CHOLESTEROL: 206 mg/dL — AB (ref <200)
HDL: 54 mg/dL (ref >50)
LDL Cholesterol: 125 mg/dL — ABNORMAL HIGH (ref <100)
Total CHOL/HDL Ratio: 3.8 Ratio (ref <5.0)
Triglycerides: 135 mg/dL (ref <150)
VLDL: 27 mg/dL (ref <30)

## 2016-01-30 LAB — VITAMIN D 25 HYDROXY (VIT D DEFICIENCY, FRACTURES): Vit D, 25-Hydroxy: 33 ng/mL (ref 30–100)

## 2016-01-30 LAB — HEPATIC FUNCTION PANEL
ALT: 16 U/L (ref 6–29)
AST: 18 U/L (ref 10–35)
Albumin: 4.4 g/dL (ref 3.6–5.1)
Alkaline Phosphatase: 67 U/L (ref 33–130)
Bilirubin, Direct: 0.1 mg/dL (ref <=0.2)
Indirect Bilirubin: 0.2 mg/dL (ref 0.2–1.2)
TOTAL PROTEIN: 6.7 g/dL (ref 6.1–8.1)
Total Bilirubin: 0.3 mg/dL (ref 0.2–1.2)

## 2016-01-30 LAB — MAGNESIUM: MAGNESIUM: 1.9 mg/dL (ref 1.5–2.5)

## 2016-01-30 LAB — HEMOGLOBIN A1C
HEMOGLOBIN A1C: 5.6 % (ref <5.7)
MEAN PLASMA GLUCOSE: 114 mg/dL

## 2016-01-30 NOTE — Progress Notes (Signed)
Pt aware of lab results & voiced understanding of those results.

## 2016-01-31 DIAGNOSIS — M25512 Pain in left shoulder: Secondary | ICD-10-CM | POA: Diagnosis not present

## 2016-01-31 DIAGNOSIS — M25612 Stiffness of left shoulder, not elsewhere classified: Secondary | ICD-10-CM | POA: Diagnosis not present

## 2016-01-31 DIAGNOSIS — M7502 Adhesive capsulitis of left shoulder: Secondary | ICD-10-CM | POA: Diagnosis not present

## 2016-02-05 DIAGNOSIS — M25612 Stiffness of left shoulder, not elsewhere classified: Secondary | ICD-10-CM | POA: Diagnosis not present

## 2016-02-05 DIAGNOSIS — M25512 Pain in left shoulder: Secondary | ICD-10-CM | POA: Diagnosis not present

## 2016-02-05 DIAGNOSIS — M7502 Adhesive capsulitis of left shoulder: Secondary | ICD-10-CM | POA: Diagnosis not present

## 2016-02-07 DIAGNOSIS — Z01419 Encounter for gynecological examination (general) (routine) without abnormal findings: Secondary | ICD-10-CM | POA: Diagnosis not present

## 2016-02-07 DIAGNOSIS — Z79890 Hormone replacement therapy (postmenopausal): Secondary | ICD-10-CM | POA: Diagnosis not present

## 2016-02-07 DIAGNOSIS — Z780 Asymptomatic menopausal state: Secondary | ICD-10-CM | POA: Diagnosis not present

## 2016-02-07 DIAGNOSIS — Z1231 Encounter for screening mammogram for malignant neoplasm of breast: Secondary | ICD-10-CM | POA: Diagnosis not present

## 2016-02-07 DIAGNOSIS — Z6836 Body mass index (BMI) 36.0-36.9, adult: Secondary | ICD-10-CM | POA: Diagnosis not present

## 2016-02-07 DIAGNOSIS — Z1389 Encounter for screening for other disorder: Secondary | ICD-10-CM | POA: Diagnosis not present

## 2016-02-09 DIAGNOSIS — H1013 Acute atopic conjunctivitis, bilateral: Secondary | ICD-10-CM | POA: Diagnosis not present

## 2016-02-15 ENCOUNTER — Other Ambulatory Visit: Payer: Self-pay | Admitting: Gynecology

## 2016-02-15 DIAGNOSIS — R928 Other abnormal and inconclusive findings on diagnostic imaging of breast: Secondary | ICD-10-CM

## 2016-02-19 ENCOUNTER — Ambulatory Visit
Admission: RE | Admit: 2016-02-19 | Discharge: 2016-02-19 | Disposition: A | Discharge status: Home / Self Care | Payer: BLUE CROSS/BLUE SHIELD | Source: Ambulatory Visit | Attending: Gynecology | Admitting: Gynecology

## 2016-02-19 DIAGNOSIS — R928 Other abnormal and inconclusive findings on diagnostic imaging of breast: Secondary | ICD-10-CM

## 2016-02-19 DIAGNOSIS — N6012 Diffuse cystic mastopathy of left breast: Secondary | ICD-10-CM | POA: Diagnosis not present

## 2016-02-19 DIAGNOSIS — N6321 Unspecified lump in the left breast, upper outer quadrant: Secondary | ICD-10-CM | POA: Diagnosis not present

## 2016-02-21 ENCOUNTER — Other Ambulatory Visit: Payer: Self-pay | Admitting: Physician Assistant

## 2016-02-22 ENCOUNTER — Other Ambulatory Visit: Payer: Self-pay | Admitting: Physician Assistant

## 2016-02-22 DIAGNOSIS — E039 Hypothyroidism, unspecified: Secondary | ICD-10-CM

## 2016-05-01 DIAGNOSIS — D235 Other benign neoplasm of skin of trunk: Secondary | ICD-10-CM | POA: Diagnosis not present

## 2016-05-01 DIAGNOSIS — L570 Actinic keratosis: Secondary | ICD-10-CM | POA: Diagnosis not present

## 2016-05-24 ENCOUNTER — Other Ambulatory Visit: Payer: Self-pay | Admitting: Physician Assistant

## 2016-07-01 ENCOUNTER — Other Ambulatory Visit: Payer: Self-pay

## 2016-07-01 MED ORDER — GABAPENTIN 300 MG PO CAPS: ORAL_CAPSULE | ORAL | 2 refills | Status: DC

## 2016-07-02 ENCOUNTER — Other Ambulatory Visit: Payer: Self-pay

## 2016-07-02 MED ORDER — GABAPENTIN 300 MG PO CAPS: ORAL_CAPSULE | ORAL | 2 refills | Status: DC

## 2016-07-11 ENCOUNTER — Encounter: Payer: Self-pay | Admitting: Physician Assistant

## 2016-07-11 ENCOUNTER — Ambulatory Visit (INDEPENDENT_AMBULATORY_CARE_PROVIDER_SITE_OTHER): Payer: BLUE CROSS/BLUE SHIELD | Admitting: Physician Assistant

## 2016-07-11 VITALS — BP 122/76 | HR 81 | Temp 97.5°F | Resp 14 | Ht 60.0 in | Wt 197.2 lb

## 2016-07-11 DIAGNOSIS — M25471 Effusion, right ankle: Secondary | ICD-10-CM

## 2016-07-11 LAB — CBC WITH DIFFERENTIAL/PLATELET
BASOS ABS: 70 {cells}/uL (ref 0–200)
BASOS PCT: 1 %
EOS ABS: 70 {cells}/uL (ref 15–500)
Eosinophils Relative: 1 %
HEMATOCRIT: 43 % (ref 35.0–45.0)
HEMOGLOBIN: 14.2 g/dL (ref 11.7–15.5)
LYMPHS ABS: 2730 {cells}/uL (ref 850–3900)
Lymphocytes Relative: 39 %
MCH: 27 pg (ref 27.0–33.0)
MCHC: 33 g/dL (ref 32.0–36.0)
MCV: 81.9 fL (ref 80.0–100.0)
MONO ABS: 490 {cells}/uL (ref 200–950)
MONOS PCT: 7 %
MPV: 9.6 fL (ref 7.5–12.5)
NEUTROS ABS: 3640 {cells}/uL (ref 1500–7800)
Neutrophils Relative %: 52 %
PLATELETS: 271 10*3/uL (ref 140–400)
RBC: 5.25 MIL/uL — ABNORMAL HIGH (ref 3.80–5.10)
RDW: 15.5 % — ABNORMAL HIGH (ref 11.0–15.0)
WBC: 7 10*3/uL (ref 3.8–10.8)

## 2016-07-11 MED ORDER — MELOXICAM 15 MG PO TABS: ORAL_TABLET | ORAL | 1 refills | Status: DC

## 2016-07-11 NOTE — Progress Notes (Signed)
Subjective:    Patient ID: Natalie Baker, female    DOB: 31-Dec-1955, 61 y.o.   MRN: [id number]  HPI 61 y.o. obese WF presents with right ankle pain x 2-3 weeks. No injury, she has pain around her lateral malleolus with walking, has swelling. She has been doing heating pain, doing brace, elevating it and helps some but not better. No numbness, tingling, redness, warmth.   Blood pressure 122/76, pulse 81, temperature (!) 97.5 F (36.4 C), resp. rate 14, height 5' (1.524 m), weight 197 lb 3.2 oz (89.4 kg), SpO2 97 %.  Medications Current Outpatient Prescriptions on File Prior to Visit  Medication Sig  . b complex vitamins tablet Take 1 tablet by mouth daily.  . calcium-vitamin D (OSCAL WITH D) 250-125 MG-UNIT per tablet Take 1 tablet by mouth daily.  . Coenzyme Q10 (COQ10 PO) Take 1 tablet by mouth 2 (two) times daily.  Marland Kitchen estradiol (VIVELLE-DOT) 0.05 MG/24HR patch Place 1 patch onto the skin 2 (two) times a week.  . Flaxseed, Linseed, (FLAX SEED OIL PO) Take 2 capsules by mouth 2 (two) times daily.  Marland Kitchen gabapentin (NEURONTIN) 300 MG capsule TAKE 1-2 PILLS AT NIGHT FOR SHOULDER PAIN  . IRON PO Take 1 tablet by mouth daily.  Marland Kitchen levothyroxine (SYNTHROID, LEVOTHROID) 112 MCG tablet TAKE 1 TABLET ON EMPTY STOMACH WITH SIP OF WATER IN AM.WAIT 30MINS TO AN HOUR BEFORE EATING  . MAGNESIUM PO Take 1 tablet by mouth daily.  . meloxicam (MOBIC) 15 MG tablet Take 1 tablet (15 mg total) by mouth daily.  . metaxalone (SKELAXIN) 800 MG tablet Take 1 tablet (800 mg total) by mouth 3 (three) times daily.  . Multiple Vitamin (MULTIVITAMIN) capsule Take 1 capsule by mouth daily.  . Omega-3 Fatty Acids (FISH OIL PO) Take 2 capsules by mouth 2 (two) times daily.  . pregabalin (LYRICA) 50 MG capsule Take 1 capsule (50 mg total) by mouth 3 (three) times daily.  . Red Yeast Rice Extract (RED YEAST RICE PO) Take 2 tablets by mouth 2 (two) times daily.   No current facility-administered medications on file prior  to visit.     Problem list She has Palpitations; Hyperlipidemia; Vitamin D deficiency; Hypothyroidism; Morbid obesity ([place]); Osteoporosis; Prediabetes; Elevated BP without diagnosis of hypertension; and Shoulder pain on her problem list.   Review of Systems  Constitutional: Negative.   HENT: Negative.   Respiratory: Negative.   Cardiovascular: Negative.   Gastrointestinal: Negative.   Genitourinary: Negative.   Musculoskeletal: Positive for arthralgias and joint swelling. Negative for back pain, gait problem, myalgias, neck pain and neck stiffness.  Skin: Negative.   Neurological: Negative.   Hematological: Negative.   Psychiatric/Behavioral: Negative.        Objective:   Physical Exam  Constitutional: She is oriented to person, place, and time. She appears well-developed and well-nourished. No distress.  Cardiovascular: Normal rate and regular rhythm.   Pulmonary/Chest: Effort normal and breath sounds normal.  Musculoskeletal:  Patient is able to ambulate well.  Gait is not  antalgic.right ankle with tenderness over lateral superior malleolus, some swelling no erythema, no ecchymosis, no effusion and full range of motion. Neurological Exam: normal Vascular Exam: normal, has very high arch  Neurological: She is alert and oriented to person, place, and time. She has normal reflexes. No cranial nerve deficit. Coordination normal.  Skin: Skin is warm and dry. No rash noted.      Assessment & Plan:    Right ankle swelling Likely  peroneal tendinopathy RICE, mobic, stretches given, if not better may send to ortho -     CBC with Differential/Platelet -     Uric acid -     meloxicam (MOBIC) 15 MG tablet; Take one daily with food for 2 weeks, can take with tylenol, can not take with aleve, iburpofen, then as needed daily for pain

## 2016-07-11 NOTE — Patient Instructions (Signed)
Peroneal Tendinopathy Peroneal tendinopathy is irritation of the tendons that pass behind your ankle (peroneal tendons). These tendons attach muscles in your foot to a bone on the side of your foot and underneath the arch of your foot. This condition can cause your peroneal tendons to get bigger and swell. What are the causes? This condition may be caused by:  Putting stress on your ankle over and over again (overuse injury).  A sudden injury that puts stress on your tendons, such as an ankle sprain.  What increases the risk? This condition is more likely to develop in:  People who have high arches.  Athletes who play sports that involve putting stress on the ankle over and over again. These sports include: ? Running. ? Dancing. ? Soccer. ? Basketball.  What are the signs or symptoms? Symptoms of this condition can start suddenly or develop gradually. Symptoms include:  Pain in the back of the ankle, on the side of the foot, or in the arch of the foot.  Pain that gets worse with activity and better with rest.  Swelling.  Warmth.  Weakness in your foot or ankle.  How is this diagnosed? This condition may be diagnosed based on:  Your symptoms.  Your medical history.  A physical exam.  Imaging tests, such as: ? An X-ray or CT scan to check for bone injury. ? MRI or ultrasound to check for muscle or tendon injury.  During your physical exam, your health care provider may move your foot and ankle and test the strength of your leg muscles. How is this treated? This condition may be treated by:  Keeping your body weight off your ankle for several days.  Returning gradually to full activity gradually.  Putting ice on your ankle to reduce swelling.  Taking an anti-inflammatory pain medicine (NSAID).  Having medicine injected into your tendon to reduce swelling.  Wearing a removable boot or brace for ankle support.  Doing range-of-motion exercises and strengthening  exercises (physical therapy) when pain and swelling improve.  If the condition does not improve with treatment, or if a tendon or muscle is damaged, surgery may be needed. Follow these instructions at home: If you have a boot or brace:  Wear it as told by your health care provider. Remove it only as told by your health care provider.  Loosen it if your toes tingle, become numb, or turn cold and blue.  Do not let it get wet if it is not waterproof.  Keep it clean. Managing pain, stiffness, and swelling  If directed, apply ice to the injured area: ? Put ice in a plastic bag. ? Place a towel between your skin and the bag. ? Leave the ice on for 20 minutes, 2-3 times a day.  Take over-the-counter and prescription medicines only as told by your health care provider.  Raise (elevate) your ankle above the level of your heart when resting if you have swelling. Activity  Do not use your ankle to support (bear) your full body weight until your health care provider says that you can.  Do not do activities that make pain or swelling worse.  Return to your normal activities as told by your health care provider. General instructions  Keep all follow-up visits as told by your health care provider. This is important. How is this prevented?  Wear supportive footwear that is appropriate for your athletic activity.  Avoid athletic activities that cause swelling or pain in your ankle or foot.  See   your health care provider if you have pain or swelling that does not improve after a few days of rest.  Stop training if you develop pain or swelling.  If you start a new athletic activity, start gradually to build up your strength, endurance, and flexibility. Contact a health care provider if:  Your symptoms get worse.  Your symptoms do not improve in 2-4 weeks.  You develop new, unexplained symptoms. This information is not intended to replace advice given to you by your health care  provider. Make sure you discuss any questions you have with your health care provider. Document Released: 12/24/2004 Document Revised: 08/29/2015 Document Reviewed: 11/12/2014 Elsevier Interactive Patient Education  2018 [employer]  

## 2016-07-12 LAB — URIC ACID: Uric Acid, Serum: 4.9 mg/dL (ref 2.5–7.0)

## 2016-07-12 NOTE — Progress Notes (Signed)
Pt aware of lab results & voiced understanding of those results.

## 2016-07-25 NOTE — Progress Notes (Signed)
Patient ID: Natalie Baker, female   DOB: 1955/05/01, 61 y.o.   MRN: [id number]  Complete Physical  Assessment and Plan: Other specified hypothyroidism Hypothyroidism-check TSH level, continue medications the same, reminded to take on an empty stomach 30-37mins before food.  - TSH  Palpitations -currently well controlled, continue to avoid triggers - EKG 12-Lead  Hyperlipidemia -continue medications, check lipids, decrease fatty foods, increase activity.  - Lipid panel  Vitamin D deficiency -cont supplement - Vit D  25 hydroxy (rtn osteoporosis monitoring)  Osteoporosis -Vit D and calcium recommended - last DEXA 2016, in osteopenia range, get DEXA  Morbid Obesity -diet and exercise discussed    Medication management - CBC with Differential/Platelet - BASIC METABOLIC PANEL WITH GFR - Hepatic function panel - Magnesium  Prediabetes - Hemoglobin A1c - Insulin, random  Elevated BP - Urinalysis, Routine w reflex microscopic (not at [facility]) - Microalbumin / creatinine urine ratio - EKG 12-Lead  Encounter for general adult medical examination with abnormal findings  Need for diphtheria-tetanus-pertussis (Tdap) vaccine  Right ankle swelling Possible peroneal tendinopathy, continue mobic, weight loss advised -     Ambulatory referral to Orthopedics  Anemia, unspecified type -     Iron and TIBC -     Vitamin B12   Discussed med's effects and SE's. Screening labs and tests as requested with regular follow-up as recommended. Future Appointments Date Time Provider [place]  07/30/2017 10:00 AM Vicie Mutters, PA-C [facility] None    HPI  61 y.o. female  presents for a complete physical.  This morning she was moving a plant and pulled in her back. Still having bilateral lateral ankle pain, right worse than left, on mobic, not helping.   Her blood pressure has been controlled at home, today their BP is BP: 120/78.  She does workout. She denies chest pain,  shortness of breath, dizziness.   She is on cholesterol medication and denies myalgias. Her cholesterol is not at goal. The cholesterol last visit was:  Lab Results  Component Value Date   CHOL 206 (H) 01/29/2016   HDL 54 01/29/2016   LDLCALC 125 (H) 01/29/2016   TRIG 135 01/29/2016   CHOLHDL 3.8 01/29/2016  . She has been working on diet and exercise for prediabetes,  and denies foot ulcerations, hyperglycemia, hypoglycemia , increased appetite, nausea, paresthesia of the feet, polydipsia, polyuria, visual disturbances, vomiting and weight loss. Last A1C in the office was:  Lab Results  Component Value Date   HGBA1C 5.6 01/29/2016   Patient is on Vitamin D supplement.   Lab Results  Component Value Date   VD25OH 33 01/29/2016     She is on thyroid medication. Her medication was not changed last visit.   Lab Results  Component Value Date   TSH 0.58 01/29/2016  .  BMI is Body mass index is 34.76 kg/m., she is working on diet and exercise. She has osteoporosis and is on vivelle dot.  Wt Readings from Last 3 Encounters:  07/29/16 196 lb 3.2 oz (89 kg)  07/11/16 197 lb 3.2 oz (89.4 kg)  01/29/16 201 lb 3.2 oz (91.3 kg)     Current Medications:  Current Outpatient Prescriptions on File Prior to Visit  Medication Sig Dispense Refill  . b complex vitamins tablet Take 1 tablet by mouth daily.    . calcium-vitamin D (OSCAL WITH D) 250-125 MG-UNIT per tablet Take 1 tablet by mouth daily.    . Coenzyme Q10 (COQ10 PO) Take 1 tablet by mouth  2 (two) times daily.    Marland Kitchen estradiol (VIVELLE-DOT) 0.05 MG/24HR patch Place 1 patch onto the skin 2 (two) times a week.    . Flaxseed, Linseed, (FLAX SEED OIL PO) Take 2 capsules by mouth 2 (two) times daily.    Marland Kitchen gabapentin (NEURONTIN) 300 MG capsule TAKE 1-2 PILLS AT NIGHT FOR SHOULDER PAIN 180 capsule 2  . IRON PO Take 1 tablet by mouth daily.    Marland Kitchen levothyroxine (SYNTHROID, LEVOTHROID) 112 MCG tablet TAKE 1 TABLET ON EMPTY STOMACH WITH SIP OF  WATER IN AM.WAIT 30MINS TO AN HOUR BEFORE EATING 90 tablet 1  . MAGNESIUM PO Take 1 tablet by mouth daily.    . meloxicam (MOBIC) 15 MG tablet Take one daily with food for 2 weeks, can take with tylenol, can not take with aleve, iburpofen, then as needed daily for pain 30 tablet 1  . metaxalone (SKELAXIN) 800 MG tablet Take 1 tablet (800 mg total) by mouth 3 (three) times daily. 30 tablet 1  . Multiple Vitamin (MULTIVITAMIN) capsule Take 1 capsule by mouth daily.    . Omega-3 Fatty Acids (FISH OIL PO) Take 2 capsules by mouth 2 (two) times daily.    . pregabalin (LYRICA) 50 MG capsule Take 1 capsule (50 mg total) by mouth 3 (three) times daily. 90 capsule 2  . Red Yeast Rice Extract (RED YEAST RICE PO) Take 2 tablets by mouth 2 (two) times daily.     No current facility-administered medications on file prior to visit.     Health Maintenance:   Immunization History  Administered Date(s) Administered  . Influenza Split 11/06/2010  . Influenza, Seasonal, Injecte, Preservative Fre 01/29/2016  . Tdap 07/29/2016   Tetanus: 2018 TODAY Influenza 2018 Prevnar due 59 Pneumonia due 50 Shingles due 60 wants next OV  Pap 2013 goes to GYN, but has had hysterectomy MGM: 02/2016, Cat B, normal left breast US DEXA: 2014 DUE Colonoscopy: 12/29/2006 due next year [place] AB 2013 Last Dental Exam: Dr. Britta Mccreedy and Sarajane Jews Last Eye Exam: Dr. Delman Cheadle   Patient Care Team: [name], MD as PCP - General (Internal Medicine) Gatha Mayer, MD as Consulting Physician (Gastroenterology) Paula Compton, MD as Consulting Physician (Obstetrics and Gynecology) Druscilla Brownie, MD as Consulting Physician (Dermatology)  Medical History:  Past Medical History:  Diagnosis Date  . Colon polyp 12/29/2006  . Hyperlipidemia   . Hypothyroidism   . Obesity    BMI 34  . Osteoporosis   . Palpitations   . Vitamin D deficiency    Allergies Allergies  Allergen Reactions  . Ppd [Tuberculin Purified  Protein Derivative]     Positive reaction.  Negative Chest Xray 06/16/12    SURGICAL HISTORY She  has a past surgical history that includes Abdominal hysterectomy (1997); Cesarean section (1983); and Colonoscopy w/ biopsies (12/29/2006). FAMILY HISTORY Her family history includes Alcohol abuse in her father; Bone cancer in her maternal aunt; Breast cancer in her maternal aunt; Cancer (age of onset: 73) in her mother; Diabetes in her mother and paternal grandmother; Heart disease in her father and maternal aunt; Heart disease (age of onset: 62) in her mother; Hyperlipidemia in her brother and maternal aunt; Hypertension in her brother, father, and mother; Liver disease in her maternal grandmother and mother; Thyroid disease in her mother. SOCIAL HISTORY She  reports that she quit smoking about 20 years ago. Her smoking use included Cigarettes. She quit after 15.00 years of use. She has never used smokeless tobacco. She reports  that she does not drink alcohol or use drugs.   Review of Systems: Review of Systems  Constitutional: Negative for chills, fever, malaise/fatigue and weight loss.  HENT: Negative for congestion, ear pain and sore throat.   Eyes: Negative.   Respiratory: Negative for cough, shortness of breath and wheezing.   Cardiovascular: Negative for chest pain, palpitations and leg swelling.  Gastrointestinal: Negative for blood in stool, constipation, diarrhea, heartburn, melena, nausea and vomiting.  Genitourinary: Negative.   Musculoskeletal: Positive for myalgias. Negative for joint pain and neck pain.  Skin: Negative for itching.  Neurological: Negative for dizziness, tingling, sensory change, loss of consciousness and headaches.  Psychiatric/Behavioral: Negative for depression. The patient is not nervous/anxious and does not have insomnia.     Physical Exam: Estimated body mass index is 34.76 kg/m as calculated from the following:   Height as of this encounter: 5\' 3"  (1.6  m).   Weight as of this encounter: 196 lb 3.2 oz (89 kg). BP 120/78   Pulse 87   Temp 98.1 F (36.7 C)   Resp 16   Ht 5\' 3"  (1.6 m)   Wt 196 lb 3.2 oz (89 kg)   SpO2 98%   BMI 34.76 kg/m   General Appearance: Well nourished well developed, in no apparent distress.  Eyes: PERRLA, EOMs, conjunctiva no swelling or erythema ENT/Mouth: Ear canals normal without obstruction, swelling, erythema, or discharge.  TMs normal bilaterally with no erythema, bulging, retraction, or loss of landmark.  Oropharynx moist and clear with no exudate, erythema, or swelling.   Neck: Supple, thyroid normal. No bruits.  No cervical adenopathy Respiratory: Respiratory effort normal, Breath sounds clear A&P without wheeze, rhonchi, rales.   Cardio: RRR without murmurs, rubs or gallops, occ extra beat. Brisk peripheral pulses without edema.  Chest: symmetric, with normal excursions Breasts: Symmetric, without lumps, nipple discharge, retractions.  Abdomen: Morbidly obese, Soft, nontender, no guarding, rebound, hernias, masses, or organomegaly.  Lymphatics: Non tender without lymphadenopathy.  Musculoskeletal: Full ROM all peripheral extremities,5/5 strength, and normal gait.  Skin: Warm, dry without rashes, lesions, ecchymosis.Right ankle with tenderness over lateral superior malleolus, some swelling no erythema, no ecchymosis, no effusion and full range of motion. Neurological Exam: normal Vascular Exam: normal, has very high arch  Neuro: Awake and oriented X 3, Cranial nerves intact, reflexes equal bilaterally. Normal muscle tone, no cerebellar symptoms. Sensation intact.  Psych:  normal affect, Insight and Judgment appropriate.   EKG: WNL, PRWP no ST changes. AORTA SCAN: defer  Over 40 minutes of exam, counseling, chart review and critical decision making was performed  Vicie Mutters 11:04 AM [facility]

## 2016-07-29 ENCOUNTER — Ambulatory Visit (INDEPENDENT_AMBULATORY_CARE_PROVIDER_SITE_OTHER): Payer: BLUE CROSS/BLUE SHIELD | Admitting: Physician Assistant

## 2016-07-29 ENCOUNTER — Encounter: Payer: Self-pay | Admitting: Physician Assistant

## 2016-07-29 VITALS — BP 120/78 | HR 87 | Temp 98.1°F | Resp 16 | Ht 63.0 in | Wt 196.2 lb

## 2016-07-29 DIAGNOSIS — Z0001 Encounter for general adult medical examination with abnormal findings: Secondary | ICD-10-CM

## 2016-07-29 DIAGNOSIS — Z23 Encounter for immunization: Secondary | ICD-10-CM | POA: Diagnosis not present

## 2016-07-29 DIAGNOSIS — I10 Essential (primary) hypertension: Secondary | ICD-10-CM | POA: Diagnosis not present

## 2016-07-29 DIAGNOSIS — Z0000 Encounter for general adult medical examination without abnormal findings: Secondary | ICD-10-CM | POA: Diagnosis not present

## 2016-07-29 DIAGNOSIS — Z136 Encounter for screening for cardiovascular disorders: Secondary | ICD-10-CM

## 2016-07-29 DIAGNOSIS — M810 Age-related osteoporosis without current pathological fracture: Secondary | ICD-10-CM

## 2016-07-29 DIAGNOSIS — D649 Anemia, unspecified: Secondary | ICD-10-CM

## 2016-07-29 DIAGNOSIS — E039 Hypothyroidism, unspecified: Secondary | ICD-10-CM

## 2016-07-29 DIAGNOSIS — E785 Hyperlipidemia, unspecified: Secondary | ICD-10-CM

## 2016-07-29 DIAGNOSIS — R002 Palpitations: Secondary | ICD-10-CM

## 2016-07-29 DIAGNOSIS — Z79899 Other long term (current) drug therapy: Secondary | ICD-10-CM

## 2016-07-29 DIAGNOSIS — M25471 Effusion, right ankle: Secondary | ICD-10-CM

## 2016-07-29 DIAGNOSIS — E559 Vitamin D deficiency, unspecified: Secondary | ICD-10-CM

## 2016-07-29 DIAGNOSIS — R030 Elevated blood-pressure reading, without diagnosis of hypertension: Secondary | ICD-10-CM

## 2016-07-29 DIAGNOSIS — R7303 Prediabetes: Secondary | ICD-10-CM

## 2016-07-29 LAB — CBC WITH DIFFERENTIAL/PLATELET
BASOS ABS: 0 {cells}/uL (ref 0–200)
Basophils Relative: 0 %
EOS PCT: 2 %
Eosinophils Absolute: 130 cells/uL (ref 15–500)
HCT: 42.2 % (ref 35.0–45.0)
Hemoglobin: 13.6 g/dL (ref 11.7–15.5)
LYMPHS ABS: 2145 {cells}/uL (ref 850–3900)
Lymphocytes Relative: 33 %
MCH: 26.8 pg — AB (ref 27.0–33.0)
MCHC: 32.2 g/dL (ref 32.0–36.0)
MCV: 83.1 fL (ref 80.0–100.0)
MONOS PCT: 8 %
MPV: 10 fL (ref 7.5–12.5)
Monocytes Absolute: 520 cells/uL (ref 200–950)
NEUTROS ABS: 3705 {cells}/uL (ref 1500–7800)
NEUTROS PCT: 57 %
PLATELETS: 275 10*3/uL (ref 140–400)
RBC: 5.08 MIL/uL (ref 3.80–5.10)
RDW: 15 % (ref 11.0–15.0)
WBC: 6.5 10*3/uL (ref 3.8–10.8)

## 2016-07-29 LAB — BASIC METABOLIC PANEL WITH GFR
BUN: 17 mg/dL (ref 7–25)
CALCIUM: 9.4 mg/dL (ref 8.6–10.4)
CO2: 27 mmol/L (ref 20–31)
CREATININE: 0.87 mg/dL (ref 0.50–0.99)
Chloride: 102 mmol/L (ref 98–110)
GFR, EST AFRICAN AMERICAN: 84 mL/min (ref >=60)
GFR, EST NON AFRICAN AMERICAN: 73 mL/min (ref >=60)
Glucose, Bld: 90 mg/dL (ref 65–99)
Potassium: 4.5 mmol/L (ref 3.5–5.3)
SODIUM: 138 mmol/L (ref 135–146)

## 2016-07-29 LAB — LIPID PANEL
CHOL/HDL RATIO: 4.2 ratio (ref <5.0)
CHOLESTEROL: 208 mg/dL — AB (ref <200)
HDL: 50 mg/dL — AB (ref >50)
LDL Cholesterol: 121 mg/dL — ABNORMAL HIGH (ref <100)
TRIGLYCERIDES: 186 mg/dL — AB (ref <150)
VLDL: 37 mg/dL — ABNORMAL HIGH (ref <30)

## 2016-07-29 LAB — HEPATIC FUNCTION PANEL
ALT: 15 U/L (ref 6–29)
AST: 17 U/L (ref 10–35)
Albumin: 4.2 g/dL (ref 3.6–5.1)
Alkaline Phosphatase: 72 U/L (ref 33–130)
BILIRUBIN DIRECT: 0 mg/dL (ref <=0.2)
BILIRUBIN INDIRECT: 0.3 mg/dL (ref 0.2–1.2)
BILIRUBIN TOTAL: 0.3 mg/dL (ref 0.2–1.2)
Total Protein: 6.6 g/dL (ref 6.1–8.1)

## 2016-07-29 LAB — IRON AND TIBC
%SAT: 15 % (ref 11–50)
Iron: 62 ug/dL (ref 45–160)
TIBC: 409 ug/dL (ref 250–450)
UIBC: 347 ug/dL

## 2016-07-29 LAB — TSH: TSH: 0.95 m[IU]/L

## 2016-07-29 NOTE — Patient Instructions (Addendum)
Due for colonoscopy beginning of this year or end of next year  Drink 80-100 oz a day of water, measure it out Eat 3 meals a day, have to do breakfast, eat protein- hard boiled eggs, protein bar like nature [place] protein bar, greek yogurt like oikos triple zero, chobani 100, or light n fit greek  Will refer to ortho for your ankles  Edema information  HOME CARE INSTRUCTIONS   Do not stand or sit in one position for long periods of time. Do not sit with your legs crossed. Rest with your legs raised during the day.  Your legs have to be higher than your heart so that gravity will force the valves to open, so please really elevate your legs.   Wear elastic stockings or support hose. Do not wear other tight, encircling garments around the legs, pelvis, or waist.  [employer]  has a wide variety of well priced compression stockings. [employer], [place] [id number] [phone]  Walk as much as possible to increase blood flow.  Raise the foot of your bed at night with 2-inch blocks. SEEK MEDICAL CARE IF:   The skin around your ankle starts to break down.  You have pain, redness, tenderness, or hard swelling developing in your leg over a vein.  You are uncomfortable due to leg pain. Document Released: 10/03/2004 Document Revised: 03/18/2011 Document Reviewed: 02/19/2010 [facility] Patient Information 2014 [place].    Simple math prevails.    1st - exercise does not produce significant weight loss - at best one converts fat into muscle , "bulks up", loses inches, but usually stays "weight neutral"     2nd - think of your body weightas a check book: If you eat more calories than you burn up - you save money or gain weight .... Or if you spend more money than you put in the check book, ie burn up more calories than you eat, then you lose weight     3rd - if you walk or run 1 mile, you burn up 100 calories - you have to burn up 3,500 calories to lose 1 pound, ie  you have to walk/run 35 miles to lose 1 measly pound. So if you want to lose 10 #, then you have to walk/run 350 miles, so.... clearly exercise is not the solution.     4. So if you consume 1,500 calories, then you have to burn up the equivalent of 15 miles to stay weight neutral - It also stands to reason that if you consume 1,500 cal/day and don't lose weight, then you must be burning up about 1,500 cals/day to stay weight neutral.     5. If you really want to lose weight, you must cut your calorie intake 300 calories /day and at that rate you should lose about 1 # every 3 days.   6. Please purchase Dr Fara Olden Fuhrman's book(s) "The End of Dieting" & "Eat to Live" . It has some great concepts and recipes.

## 2016-07-30 LAB — MAGNESIUM: Magnesium: 2 mg/dL (ref 1.5–2.5)

## 2016-07-30 LAB — MICROALBUMIN / CREATININE URINE RATIO
Creatinine, Urine: 35 mg/dL (ref 20–320)
Microalb, Ur: <0.2 mg/dL

## 2016-07-30 LAB — URINALYSIS, ROUTINE W REFLEX MICROSCOPIC
Bilirubin Urine: NEGATIVE
GLUCOSE, UA: NEGATIVE
HGB URINE DIPSTICK: NEGATIVE
KETONES UR: NEGATIVE
Leukocytes, UA: NEGATIVE
Nitrite: NEGATIVE
PROTEIN: NEGATIVE
Specific Gravity, Urine: 1.008 (ref 1.001–1.035)
pH: 6 (ref 5.0–8.0)

## 2016-07-30 LAB — VITAMIN B12: VITAMIN B 12: 378 pg/mL (ref 200–1100)

## 2016-07-30 LAB — HEMOGLOBIN A1C
Hgb A1c MFr Bld: 5.5 % (ref <5.7)
MEAN PLASMA GLUCOSE: 111 mg/dL

## 2016-07-30 LAB — VITAMIN D 25 HYDROXY (VIT D DEFICIENCY, FRACTURES): VIT D 25 HYDROXY: 28 ng/mL — AB (ref 30–100)

## 2016-07-30 NOTE — Progress Notes (Signed)
Pt aware of lab results & voiced understanding of those results. Labs were mailed out to pt per pt's request.

## 2016-08-14 ENCOUNTER — Other Ambulatory Visit: Payer: BLUE CROSS/BLUE SHIELD

## 2016-09-03 ENCOUNTER — Other Ambulatory Visit: Payer: Self-pay | Admitting: Internal Medicine

## 2016-09-03 DIAGNOSIS — E039 Hypothyroidism, unspecified: Secondary | ICD-10-CM

## 2016-09-05 ENCOUNTER — Other Ambulatory Visit: Payer: Self-pay | Admitting: Physician Assistant

## 2016-12-13 DIAGNOSIS — M25571 Pain in right ankle and joints of right foot: Secondary | ICD-10-CM | POA: Diagnosis not present

## 2017-01-22 ENCOUNTER — Encounter: Payer: Self-pay | Admitting: Internal Medicine

## 2017-01-28 NOTE — Progress Notes (Signed)
Assessment and Plan:   Hypertension -Continue medication, monitor blood pressure at home. Continue DASH diet.  Reminder to go to the ER if any CP, SOB, nausea, dizziness, severe HA, changes vision/speech, left arm numbness and tingling and jaw pain.  Cholesterol -Continue diet and exercise. Check cholesterol.    Prediabetes  -Continue diet and exercise. Check A1C  Vitamin D Def - check level and continue medications.   Morbid Obesity with co morbidities - follow up 6 months for progress monitoring - increase veggies, decrease carbs - long discussion about weight loss, diet, and exercise   Continue diet and meds as discussed. Further disposition pending results of labs. Over 30 minutes of exam, counseling, chart review, and critical decision making was performed  Future Appointments  Date Time Provider [place]  07/30/2017 10:00 AM Vicie Mutters, PA-C [facility] None     HPI 62 y.o. female  presents for 3 month follow up on hypertension, cholesterol, prediabetes, and vitamin D deficiency.  Her blood pressure has been controlled at home, today their BP is BP: 120/80  She does not workout. She denies chest pain, shortness of breath, dizziness.  She is not on cholesterol medication and denies myalgias. Her cholesterol is at goal. The cholesterol last visit was:   Lab Results  Component Value Date   CHOL 208 (H) 07/29/2016   HDL 50 (L) 07/29/2016   LDLCALC 121 (H) 07/29/2016   TRIG 186 (H) 07/29/2016   CHOLHDL 4.2 07/29/2016    She has been working on diet and exercise for prediabetes, and denies paresthesia of the feet, polydipsia, polyuria and visual disturbances. Last A1C in the office was:  Lab Results  Component Value Date   HGBA1C 5.5 07/29/2016   Patient is on Vitamin D supplement, started on 2000 IU.   Lab Results  Component Value Date   VD25OH 28 (L) 07/29/2016     She is on thyroid medication. Her medication was not changed last visit.   Lab Results   Component Value Date   TSH 0.95 07/29/2016  .  BMI is Body mass index is 35.29 kg/m., she is working on diet and exercise. Wt Readings from Last 3 Encounters:  01/29/17 199 lb 3.2 oz (90.4 kg)  07/29/16 196 lb 3.2 oz (89 kg)  07/11/16 197 lb 3.2 oz (89.4 kg)   She follows with ortho for neck and left shoulder pain, she is on gabapentin.   Current Medications:  Current Outpatient Medications on File Prior to Visit  Medication Sig Dispense Refill  . b complex vitamins tablet Take 1 tablet by mouth daily.    . calcium-vitamin D (OSCAL WITH D) 250-125 MG-UNIT per tablet Take 1 tablet by mouth daily.    . Coenzyme Q10 (COQ10 PO) Take 1 tablet by mouth 2 (two) times daily.    Marland Kitchen estradiol (VIVELLE-DOT) 0.05 MG/24HR patch Place 1 patch onto the skin 2 (two) times a week.    . Flaxseed, Linseed, (FLAX SEED OIL PO) Take 2 capsules by mouth 2 (two) times daily.    Marland Kitchen gabapentin (NEURONTIN) 300 MG capsule TAKE 1-2 PILLS AT NIGHT FOR SHOULDER PAIN 180 capsule 2  . IRON PO Take 1 tablet by mouth daily.    Marland Kitchen levothyroxine (SYNTHROID, LEVOTHROID) 112 MCG tablet TAKE 1 TABLET ON EMPTY STOMACH WITH SIP OF WATER IN AM.WAIT 30MINS TO AN HOUR BEFORE EATING 90 tablet 1  . MAGNESIUM PO Take 1 tablet by mouth daily.    . meloxicam (MOBIC) 15 MG tablet  1 TAB DAILY WITH FOOD X 2 WKS CAN TAKE WITH TYLENOL,CAN'T TAKE ALEVE,IBUPROFEN THEN DAILY AS NEEDED 30 tablet 1  . metaxalone (SKELAXIN) 800 MG tablet Take 1 tablet (800 mg total) by mouth 3 (three) times daily. 30 tablet 1  . Multiple Vitamin (MULTIVITAMIN) capsule Take 1 capsule by mouth daily.    . Omega-3 Fatty Acids (FISH OIL PO) Take 2 capsules by mouth 2 (two) times daily.    . Red Yeast Rice Extract (RED YEAST RICE PO) Take 2 tablets by mouth 2 (two) times daily.     No current facility-administered medications on file prior to visit.    Medical History:  Past Medical History:  Diagnosis Date  . Colon polyp 12/29/2006  . Hyperlipidemia   .  Hypothyroidism   . Obesity    BMI 34  . Osteoporosis   . Palpitations   . Vitamin D deficiency    Allergies:  Allergies  Allergen Reactions  . Ppd [Tuberculin Purified Protein Derivative]     Positive reaction.  Negative Chest Xray 06/16/12     Review of Systems:  Review of Systems  Constitutional: Negative for chills, fever, malaise/fatigue and weight loss.  HENT: Negative for congestion, ear pain and sore throat.   Eyes: Negative.   Respiratory: Negative for cough, shortness of breath and wheezing.   Cardiovascular: Negative for chest pain, palpitations and leg swelling.  Gastrointestinal: Negative for blood in stool, constipation, diarrhea, heartburn, melena, nausea and vomiting.  Genitourinary: Negative.   Musculoskeletal: Positive for neck pain (better with PT). Negative for joint pain.  Skin: Negative for itching.  Neurological: Negative for dizziness, tingling, sensory change, loss of consciousness and headaches.  Psychiatric/Behavioral: Negative for depression. The patient is not nervous/anxious and does not have insomnia.     Family history- Review and unchanged Social history- Review and unchanged Physical Exam: BP 120/80   Pulse 73   Temp 97.6 F (36.4 C)   Resp 14   Ht 5\' 3"  (1.6 m)   Wt 199 lb 3.2 oz (90.4 kg)   SpO2 96%   BMI 35.29 kg/m  Wt Readings from Last 3 Encounters:  01/29/17 199 lb 3.2 oz (90.4 kg)  07/29/16 196 lb 3.2 oz (89 kg)  07/11/16 197 lb 3.2 oz (89.4 kg)   General Appearance: Well nourished, in no apparent distress. Eyes: PERRLA, EOMs, conjunctiva no swelling or erythema Sinuses: No Frontal/maxillary tenderness ENT/Mouth: Ext aud canals clear, TMs without erythema, bulging. No erythema, swelling, or exudate on post pharynx.  Tonsils not swollen or erythematous. Hearing normal.  Neck: Supple, thyroid normal.  Respiratory: Respiratory effort normal, BS equal bilaterally without rales, rhonchi, wheezing or stridor.  Cardio: RRR with no  MRGs. Brisk peripheral pulses without edema.  Abdomen: Soft, + BS,  Non tender, no guarding, rebound, hernias, masses. Lymphatics: Non tender without lymphadenopathy.  Musculoskeletal: Full ROM, 5/5 strength, Normal gait Skin: Warm, dry without rashes, lesions, ecchymosis.  Neuro: Cranial nerves intact. Normal muscle tone, no cerebellar symptoms. Psych: Awake and oriented X 3, normal affect, Insight and Judgment appropriate.    Vicie Mutters, PA-C 3:48 PM [facility]

## 2017-01-29 ENCOUNTER — Ambulatory Visit: Payer: BLUE CROSS/BLUE SHIELD | Admitting: Physician Assistant

## 2017-01-29 ENCOUNTER — Encounter: Payer: Self-pay | Admitting: Physician Assistant

## 2017-01-29 VITALS — BP 120/80 | HR 73 | Temp 97.6°F | Resp 14 | Ht 63.0 in | Wt 199.2 lb

## 2017-01-29 DIAGNOSIS — E785 Hyperlipidemia, unspecified: Secondary | ICD-10-CM | POA: Diagnosis not present

## 2017-01-29 DIAGNOSIS — E559 Vitamin D deficiency, unspecified: Secondary | ICD-10-CM | POA: Diagnosis not present

## 2017-01-29 DIAGNOSIS — R030 Elevated blood-pressure reading, without diagnosis of hypertension: Secondary | ICD-10-CM | POA: Diagnosis not present

## 2017-01-29 DIAGNOSIS — E039 Hypothyroidism, unspecified: Secondary | ICD-10-CM | POA: Diagnosis not present

## 2017-01-29 DIAGNOSIS — R7303 Prediabetes: Secondary | ICD-10-CM

## 2017-01-29 MED ORDER — LEVOTHYROXINE SODIUM 112 MCG PO TABS: ORAL_TABLET | ORAL | 1 refills | Status: DC

## 2017-01-29 NOTE — Patient Instructions (Signed)
8 Critical Weight-Loss Tips That Aren't Diet and Exercise  1. STARVE THE DISTRACTIONS  All too often when we eat, we're also multitasking: watching TV, answering emails, scrolling through social media. These habits are detrimental to having a strong, clear, healthy relationship with food, and they can hinder our ability to make dietary changes.  In order to truly focus on what you're eating, how much you're eating, why you're eating those specific foods and, most importantly, how those foods make you feel, you need to starve the distractions. That means when you eat, just eat. Focus on your food, the process it went through to end up on your plate, where it came from and how it nourishes you. With this technique, you're more likely to finish a meal feeling satiated.  2.  CONSIDER WHAT YOU'RE NOT WILLING TO DO  This might sound counterintuitive, but it can help provide a "why" when motivation is waning. Declare, in writing, what you are unwilling to do, for example "I am unwilling to be the old dad who cannot play sports with my children".  So consider what you're not willing to accept, write it down, and keep it at the ready.  3.  STOP LABELING FOOD "GOOD" AND "BAD"  You've probably heard someone say they ate something "bad." Maybe you've even said it yourself.  The trouble with 'bad' foods isn't that they'll send you to the grave after a bite or two. The trouble comes when we eat excessive portions of really calorie-dense foods meal after meal, day after day.  Instead of labeling foods as good or bad, think about which foods you can eat a lot of, and which ones you should just eat a little of. Then, plan ways to eat the foods you really like in portions that fit with your overall goals. A good example of this would be having a slice of pizza alongside a club salad with chicken breast, avocado and a bit of dressing. This is vastly different than 3 slices of pizza, 4 breadsticks with cheese sauce  and half of a liter of regular soda.  4.  BRUSH YOUR TEETH AFTER YOU EAT  Getting your mindset in order is important, but sometimes small habits can make a big difference. After eating, you still have the taste of food in their mouth, which often causes people to eat more even if they are full or engage in a nibble or two of dessert.  Brushing your teeth will remove the taste of food from your mouth, and the clean, minty freshness will serve as a cue that mealtime is over.  5.  FOCUS ON CROWDING NOT CUTTING  The most common first step during 'dieting' is to cut. We cut our portion sizes down, we cut out 'bad' foods, we cut out entire food groups. This act of cutting puts us and our minds into scarcity mode.  When something is off-limits, even if you're able to avoid it for a while, you could end up bingeing on it later because you've gone so long without it. So, instead of cutting, focus on crowding. If you crowd your plate and fill it up with more foods like veggies and protein, it simply allows less room for the other stuff. In other words, shift your focus away from what you can't eat, and celebrate the foods that will help you reach your goals.  6.  TAKE TRACKING A STEP FURTHER  Track what you eat, when you ate it, how much you ate   and how that food made you feel. Being completely honest with yourself and writing down every single thing that passes through your lips will help you start to notice that maybe you actually do snack, possibly take in more sugar than you thought, eat when you're bored rather than just hungry or maybe that you have a habit of snacking before bed while watching TV.  The difference from simply tracking your food intake is you're taking into account how food makes you feel, as well as what you're doing while you're eating. This is about becoming more mindful of what, when and why you eat.  7.  PRIORITIZE GOOD SLEEP  One of the strongest risk factors for being  overweight is poor sleep. When you're feeling tired, you're more likely to choose unhealthy comfort foods and to skip your workout. Additionally, sleep deprivation may slow down your metabolism. [employer]! Therefore, sleeping 7-8 hours per night can help with weight loss without having to change your diet or increase your physical activity. And if you feel you snore and still wake up tired, talk with me about sleep apnea.  8.  SET ASIDE TIME TO DISCONNECT  Just get out there. Disconnect from the electronics and connect to the elements. Not only will this help reduce stress (a major factor in weight gain) by giving your mind a break from the constant stimulation we've all become so accustomed to, but it may also reprogram your brain to connect with yourself and what you're feeling.  SMART goals Having a solid fitness goal is an amazing way to power you towards success, but not all goals are created equal. While it's great to have an end-game in mind, there are some best practices when it comes to goal setting. Whether you want to lose weight, improve your fitness level, or train for an event, putting the SMART method into action can help you achieve what you set out to do.  SMART stands for specific, measurable, attainable, relevant, and timely-all of which are important in reaching a fitness objective. SMART goals can help keep you on track and remind you of your priorities, so you're able to follow through with every workout or healthy meal you have planned.   Get SMART and put these five elements into action when you're setting your fitness goal.  1. Specific  You need something that's not too arbitrary. For example, a bad goal would be, say, 'get healthy'. A specific goal would be to lose weight. You'll narrow down that goal even further by using the rest of the method, but whether you want to get stronger, faster, or smaller, having a baseline points you in the right direction.  2.  Measurable  Here's where you determine exactly how you'll measure your goal. If you're going to follow the bad goal, it would be get really healthy.That's not quantifiable. A measurable goal would be, say, 'lose 10 pounds'. You can quantify your progress, and you can sort of back into a time frame once you have that. Your goal may be to master a pull-up, run five miles, or go to the gym four days a week-whatever it is, you should have a definite way of knowing when you've reached your goal.  3. Attainable  While it can be helpful to set big-picture goals in the long-term, you need a more achievable goal on the horizon to keep you on track. You want to start small and see early wins, which encourages long-term consistency.  If you set  something too lofty right off the bat, it might be discouraging to not make progress as fast as you would like. You should also consider the size of your goal-for example, a goal of losing 30 pounds in one month just isn't going to happen, so you're better off setting smaller goals that are in closer reach.  4. Relevant  This is where things get a little tricky, finding your "why" is easier said than done. Ask yourself, 'is this goal worthwhile, and am I motivated to do it?' Creating a goal with some type of motivation attached to it, like I want to lose 10 pounds in two months to be ready for my wedding, can give a bit of relevancy to your goal. Whether you want to feel confident at a big event or perform better during everyday activities, pinpoint why a goal is important to you.  5. Timely  You want to be strict about a deadline-doing so creates urgency. It's also important not to set your sights too far out. If you give yourself four months to lose 10 pounds, that might be too long because you aren't incentivized to start working at it immediately. Instead, consider setting smaller goals along the way, like "I want to lose three pounds in two weeks." Maybe running a  marathon is your long-term goal, but if you've never been a runner, signing up for one that's a month away isn't realistic-instead, set smaller mileage goals for shorter time periods and work your way up.  You should also be honest with yourself about what you're able to accomplish in a given time frame. You just need to adjust your expectations so they're in line with your schedule and commitments.  Once you have your goal in place, it's all about the follow-through. Whether you want to lose one pound a week, be able to do five full push-ups in two weeks, or run a 5K in under 30 minutes in four weeks, you can come up with a plan to help get your where you want to go-but it all starts with deciding what you want. Be accountable to yourself, stay consistent, and the results will follow.  So try the exercise at home and at your next visit come in with your SMART goal and we can discuss how together we can achieve this goal!

## 2017-01-30 ENCOUNTER — Other Ambulatory Visit: Payer: Self-pay | Admitting: Physician Assistant

## 2017-01-30 DIAGNOSIS — Z79899 Other long term (current) drug therapy: Secondary | ICD-10-CM

## 2017-01-30 LAB — HEPATIC FUNCTION PANEL
AG Ratio: 1.6 (calc) (ref 1.0–2.5)
ALKALINE PHOSPHATASE (APISO): 72 U/L (ref 33–130)
ALT: 17 U/L (ref 6–29)
AST: 18 U/L (ref 10–35)
Albumin: 4.4 g/dL (ref 3.6–5.1)
BILIRUBIN DIRECT: 0.1 mg/dL (ref 0.0–0.2)
BILIRUBIN INDIRECT: 0.1 mg/dL — AB (ref 0.2–1.2)
GLOBULIN: 2.7 g/dL (ref 1.9–3.7)
Total Bilirubin: 0.2 mg/dL (ref 0.2–1.2)
Total Protein: 7.1 g/dL (ref 6.1–8.1)

## 2017-01-30 LAB — CBC WITH DIFFERENTIAL/PLATELET
Basophils Absolute: 53 cells/uL (ref 0–200)
Basophils Relative: 0.7 %
Eosinophils Absolute: 83 cells/uL (ref 15–500)
Eosinophils Relative: 1.1 %
HCT: 44.2 % (ref 35.0–45.0)
Hemoglobin: 14.4 g/dL (ref 11.7–15.5)
Lymphs Abs: 2618 cells/uL (ref 850–3900)
MCH: 26.6 pg — ABNORMAL LOW (ref 27.0–33.0)
MCHC: 32.6 g/dL (ref 32.0–36.0)
MCV: 81.7 fL (ref 80.0–100.0)
MONOS PCT: 7.6 %
MPV: 10.6 fL (ref 7.5–12.5)
NEUTROS PCT: 55.7 %
Neutro Abs: 4178 cells/uL (ref 1500–7800)
PLATELETS: 268 10*3/uL (ref 140–400)
RBC: 5.41 10*6/uL — AB (ref 3.80–5.10)
RDW: 13.6 % (ref 11.0–15.0)
TOTAL LYMPHOCYTE: 34.9 %
WBC: 7.5 10*3/uL (ref 3.8–10.8)
WBCMIX: 570 {cells}/uL (ref 200–950)

## 2017-01-30 LAB — BASIC METABOLIC PANEL WITH GFR
BUN / CREAT RATIO: 21 (calc) (ref 6–22)
BUN: 21 mg/dL (ref 7–25)
CHLORIDE: 103 mmol/L (ref 98–110)
CO2: 29 mmol/L (ref 20–32)
Calcium: 9.8 mg/dL (ref 8.6–10.4)
Creat: 1.02 mg/dL — ABNORMAL HIGH (ref 0.50–0.99)
GFR, Est African American: 69 mL/min/{1.73_m2} (ref >=60)
GFR, Est Non African American: 59 mL/min/{1.73_m2} — ABNORMAL LOW (ref >=60)
GLUCOSE: 90 mg/dL (ref 65–99)
POTASSIUM: 4.6 mmol/L (ref 3.5–5.3)
SODIUM: 141 mmol/L (ref 135–146)

## 2017-01-30 LAB — LIPID PANEL
Cholesterol: 217 mg/dL — ABNORMAL HIGH (ref <200)
HDL: 59 mg/dL (ref >50)
LDL CHOLESTEROL (CALC): 133 mg/dL — AB
Non-HDL Cholesterol (Calc): 158 mg/dL (calc) — ABNORMAL HIGH (ref <130)
Total CHOL/HDL Ratio: 3.7 (calc) (ref <5.0)
Triglycerides: 142 mg/dL (ref <150)

## 2017-01-30 LAB — HEMOGLOBIN A1C
EAG (MMOL/L): 6.6 (calc)
HEMOGLOBIN A1C: 5.8 %{Hb} — AB (ref <5.7)
MEAN PLASMA GLUCOSE: 120 (calc)

## 2017-01-30 LAB — TSH: TSH: 0.52 m[IU]/L (ref 0.40–4.50)

## 2017-01-30 LAB — VITAMIN D 25 HYDROXY (VIT D DEFICIENCY, FRACTURES): Vit D, 25-Hydroxy: 29 ng/mL — ABNORMAL LOW (ref 30–100)

## 2017-03-03 ENCOUNTER — Ambulatory Visit: Payer: Self-pay

## 2017-03-06 ENCOUNTER — Ambulatory Visit: Payer: BLUE CROSS/BLUE SHIELD

## 2017-03-06 DIAGNOSIS — R030 Elevated blood-pressure reading, without diagnosis of hypertension: Secondary | ICD-10-CM | POA: Diagnosis not present

## 2017-03-06 DIAGNOSIS — Z79899 Other long term (current) drug therapy: Secondary | ICD-10-CM

## 2017-03-07 LAB — BASIC METABOLIC PANEL WITH GFR
BUN/Creatinine Ratio: 14 (calc) (ref 6–22)
BUN: 16 mg/dL (ref 7–25)
CALCIUM: 9.9 mg/dL (ref 8.6–10.4)
CO2: 33 mmol/L — AB (ref 20–32)
CREATININE: 1.14 mg/dL — AB (ref 0.50–0.99)
Chloride: 101 mmol/L (ref 98–110)
GFR, Est African American: 60 mL/min/{1.73_m2} (ref >=60)
GFR, Est Non African American: 52 mL/min/{1.73_m2} — ABNORMAL LOW (ref >=60)
GLUCOSE: 85 mg/dL (ref 65–99)
POTASSIUM: 4.4 mmol/L (ref 3.5–5.3)
SODIUM: 141 mmol/L (ref 135–146)

## 2017-04-01 ENCOUNTER — Ambulatory Visit (INDEPENDENT_AMBULATORY_CARE_PROVIDER_SITE_OTHER): Payer: BLUE CROSS/BLUE SHIELD | Admitting: Physician Assistant

## 2017-04-01 ENCOUNTER — Encounter: Payer: Self-pay | Admitting: Physician Assistant

## 2017-04-01 VITALS — BP 126/84 | HR 86 | Temp 97.5°F | Resp 16 | Ht 63.0 in | Wt 199.0 lb

## 2017-04-01 DIAGNOSIS — R030 Elevated blood-pressure reading, without diagnosis of hypertension: Secondary | ICD-10-CM

## 2017-04-01 DIAGNOSIS — N183 Chronic kidney disease, stage 3 unspecified: Secondary | ICD-10-CM

## 2017-04-01 NOTE — Progress Notes (Signed)
Subjective:    Patient ID: Natalie Baker, female    DOB: 11-03-1955, 62 y.o.   MRN: [id number]  HPI 62 y.o. WF with history of presents for follow up for kidney function.   Last GFR was normal >89 in 04/2015, 73 07/29/2016, 59 01/29/2017 and 52 03/06/2017. Her BUN is 16 but Cr is 1.14. She is stage 3.   Last microalbumin and urine was 07/29/2016. ANA, antiDNA, RF, sed rate, CCP in 2015 and HIV, Hep C 17. 2017. She is not on mobic, she is only on thyroid, zyrtec and gabapentin x 1 year. BP has been controlled, she has preDM but no DM. She has been trying to get more but admits not getting as much, most days she will get 50-60 oz. No rash, fever, chills, no urinary issues, some center back pain that is unchanged, some right flank pain x several months that is new, intermittent, not worse with movement.   She is not on any NSAIDS, no OTC supplements. 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  Normal kidney US 09/2011.   Lab Results  Component Value Date   HGBA1C 5.8 (H) 01/29/2017     Lab Results  Component Value Date   GFRNONAA 52 (L) 03/06/2017    Blood pressure 126/84, pulse 86, temperature (!) 97.5 F (36.4 C), resp. rate 16, height 5\' 3"  (1.6 m), weight 199 lb (90.3 kg), SpO2 97 %.  Medications Current Outpatient Medications on File Prior to Visit  Medication Sig  . b complex vitamins tablet Take 1 tablet by mouth daily.  . calcium-vitamin D (OSCAL WITH D) 250-125 MG-UNIT per tablet Take 1 tablet by mouth daily.  . Coenzyme Q10 (COQ10 PO) Take 1 tablet by mouth 2 (two) times daily.  Marland Kitchen estradiol (VIVELLE-DOT) 0.05 MG/24HR patch Place 1 patch onto the skin 2 (two) times a week.  . Flaxseed, Linseed, (FLAX SEED OIL PO) Take 2 capsules by mouth 2 (two) times daily.  Marland Kitchen gabapentin (NEURONTIN) 300 MG capsule TAKE 1-2 PILLS AT NIGHT FOR SHOULDER PAIN  . IRON PO Take 1 tablet by mouth daily.  Marland Kitchen levothyroxine (SYNTHROID,  LEVOTHROID) 112 MCG tablet TAKE 1 TABLET ON EMPTY STOMACH WITH SIP OF WATER IN AM.WAIT 30MINS TO AN HOUR BEFORE EATING  . MAGNESIUM PO Take 1 tablet by mouth daily.  . meloxicam (MOBIC) 15 MG tablet 1 TAB DAILY WITH FOOD X 2 WKS CAN TAKE WITH TYLENOL,CAN'T TAKE ALEVE,IBUPROFEN THEN DAILY AS NEEDED  . metaxalone (SKELAXIN) 800 MG tablet Take 1 tablet (800 mg total) by mouth 3 (three) times daily.  . Multiple Vitamin (MULTIVITAMIN) capsule Take 1 capsule by mouth daily.  . Omega-3 Fatty Acids (FISH OIL PO) Take 2 capsules by mouth 2 (two) times daily.  . Red Yeast Rice Extract (RED YEAST RICE PO) Take 2 tablets by mouth 2 (two) times daily.   No current facility-administered medications on file prior to visit.     Problem list She has Palpitations; Hyperlipidemia; Vitamin D deficiency; Hypothyroidism; Morbid obesity ([place]); Osteoporosis; Prediabetes; Elevated BP without diagnosis of hypertension; and Shoulder pain on their problem list.   Review of Systems  Constitutional: Negative.  Negative for chills, diaphoresis, fatigue and fever.  HENT: Negative.   Respiratory: Negative.  Negative for cough.   Cardiovascular: Negative.  Negative for leg swelling.  Gastrointestinal: Negative.   Genitourinary: Positive for flank pain (right). Negative for difficulty urinating, dysuria, frequency and hematuria.  Musculoskeletal: Negative for arthralgias.  Skin: Negative.  Negative for rash.  Neurological: Negative.   Hematological: Negative.   Psychiatric/Behavioral: Negative.        Objective:   Physical Exam  Constitutional: She is oriented to person, place, and time. She appears well-developed and well-nourished. No distress.  Neck: Normal range of motion. Neck supple.  Cardiovascular: Normal rate, regular rhythm, normal heart sounds and intact distal pulses.  No murmur heard. No edema  Pulmonary/Chest: Effort normal and breath sounds normal. No respiratory distress. She has no wheezes. She  has no rales. She exhibits no tenderness.  Abdominal: Soft. There is no tenderness. There is CVA tenderness (right flank).  Musculoskeletal: She exhibits no edema.  Mild paraspinal tenderness right lumbar, no SI tenderness  Lymphadenopathy:    She has no cervical adenopathy.  Neurological: She is alert and oriented to person, place, and time. No cranial nerve deficit.  Skin: Skin is warm and dry. No rash noted.      Assessment & Plan:    CKD (chronic kidney disease) stage 3, GFR 30-59 ml/min (HCC) Elevated BP without diagnosis of hypertension If still abnormal, may get right renal US, having some pain there Check labs Increase water -     BASIC METABOLIC PANEL WITH GFR -     Urinalysis, Routine w reflex microscopic -     Microalbumin / creatinine urine ratio -     RPR -     ANCA screen with reflex titer -     Protein Electrophoresis,Random Urn

## 2017-04-02 ENCOUNTER — Other Ambulatory Visit: Payer: Self-pay | Admitting: Physician Assistant

## 2017-04-02 DIAGNOSIS — N183 Chronic kidney disease, stage 3 unspecified: Secondary | ICD-10-CM

## 2017-04-02 LAB — URINALYSIS, ROUTINE W REFLEX MICROSCOPIC
BILIRUBIN URINE: NEGATIVE
Glucose, UA: NEGATIVE
Hgb urine dipstick: NEGATIVE
Ketones, ur: NEGATIVE
LEUKOCYTES UA: NEGATIVE
NITRITE: NEGATIVE
PROTEIN: NEGATIVE
SPECIFIC GRAVITY, URINE: 1.012 (ref 1.001–1.03)
pH: 6.5 (ref 5.0–8.0)

## 2017-04-02 LAB — MICROALBUMIN / CREATININE URINE RATIO
CREATININE, URINE: 84 mg/dL (ref 20–275)
Microalb Creat Ratio: 12 mcg/mg creat (ref <30)
Microalb, Ur: 1 mg/dL

## 2017-04-02 LAB — BASIC METABOLIC PANEL WITH GFR
BUN/Creatinine Ratio: 13 (calc) (ref 6–22)
BUN: 16 mg/dL (ref 7–25)
CALCIUM: 9.6 mg/dL (ref 8.6–10.4)
CHLORIDE: 102 mmol/L (ref 98–110)
CO2: 27 mmol/L (ref 20–32)
Creat: 1.24 mg/dL — ABNORMAL HIGH (ref 0.50–0.99)
GFR, EST AFRICAN AMERICAN: 54 mL/min/{1.73_m2} — AB (ref >=60)
GFR, Est Non African American: 47 mL/min/{1.73_m2} — ABNORMAL LOW (ref >=60)
Glucose, Bld: 86 mg/dL (ref 65–99)
Potassium: 4.3 mmol/L (ref 3.5–5.3)
Sodium: 140 mmol/L (ref 135–146)

## 2017-04-02 LAB — RPR: RPR: NONREACTIVE

## 2017-04-03 LAB — PROTEIN ELECTROPHORESIS,RANDOM URN
CREATININE, URINE: 84 mg/dL (ref 20–275)
Protein/Creat Ratio: 95 mg/g creat (ref 21–161)
TOTAL PROTEIN, URINE: 8 mg/dL (ref 5–24)

## 2017-04-03 LAB — ANCA SCREEN W REFLEX TITER: ANCA Screen: NEGATIVE

## 2017-04-08 ENCOUNTER — Encounter: Payer: Self-pay | Admitting: Internal Medicine

## 2017-04-24 ENCOUNTER — Other Ambulatory Visit: Payer: Self-pay | Admitting: Internal Medicine

## 2017-04-24 DIAGNOSIS — E039 Hypothyroidism, unspecified: Secondary | ICD-10-CM

## 2017-04-29 DIAGNOSIS — Z780 Asymptomatic menopausal state: Secondary | ICD-10-CM | POA: Diagnosis not present

## 2017-04-29 DIAGNOSIS — Z01419 Encounter for gynecological examination (general) (routine) without abnormal findings: Secondary | ICD-10-CM | POA: Diagnosis not present

## 2017-04-29 DIAGNOSIS — Z6836 Body mass index (BMI) 36.0-36.9, adult: Secondary | ICD-10-CM | POA: Diagnosis not present

## 2017-04-29 DIAGNOSIS — Z1231 Encounter for screening mammogram for malignant neoplasm of breast: Secondary | ICD-10-CM | POA: Diagnosis not present

## 2017-04-29 DIAGNOSIS — Z79890 Hormone replacement therapy (postmenopausal): Secondary | ICD-10-CM | POA: Diagnosis not present

## 2017-04-29 DIAGNOSIS — Z130 Encounter for screening for diseases of the blood and blood-forming organs and certain disorders involving the immune mechanism: Secondary | ICD-10-CM | POA: Diagnosis not present

## 2017-04-29 DIAGNOSIS — Z1389 Encounter for screening for other disorder: Secondary | ICD-10-CM | POA: Diagnosis not present

## 2017-07-16 DIAGNOSIS — R030 Elevated blood-pressure reading, without diagnosis of hypertension: Secondary | ICD-10-CM | POA: Diagnosis not present

## 2017-07-16 DIAGNOSIS — N183 Chronic kidney disease, stage 3 (moderate): Secondary | ICD-10-CM | POA: Diagnosis not present

## 2017-07-24 ENCOUNTER — Other Ambulatory Visit: Payer: Self-pay | Admitting: Nephrology

## 2017-07-24 DIAGNOSIS — N289 Disorder of kidney and ureter, unspecified: Secondary | ICD-10-CM | POA: Diagnosis not present

## 2017-07-24 DIAGNOSIS — N183 Chronic kidney disease, stage 3 unspecified: Secondary | ICD-10-CM

## 2017-07-24 DIAGNOSIS — E039 Hypothyroidism, unspecified: Secondary | ICD-10-CM | POA: Diagnosis not present

## 2017-07-24 DIAGNOSIS — N951 Menopausal and female climacteric states: Secondary | ICD-10-CM | POA: Diagnosis not present

## 2017-07-24 DIAGNOSIS — E559 Vitamin D deficiency, unspecified: Secondary | ICD-10-CM | POA: Diagnosis not present

## 2017-07-24 DIAGNOSIS — R5383 Other fatigue: Secondary | ICD-10-CM | POA: Diagnosis not present

## 2017-07-28 NOTE — Progress Notes (Deleted)
Patient ID: ARCELIA PALS, female   DOB: 10/27/1955, 62 y.o.   MRN: [id number]  Complete Physical  Assessment and Plan:  Encounter for general adult medical examination with abnormal findings  Other specified hypothyroidism Hypothyroidism-check TSH level, continue medications the same, reminded to take on an empty stomach 30-71mins before food.  - TSH  Palpitations -currently well controlled, continue to avoid triggers - EKG 12-Lead  Hyperlipidemia -continue medications, check lipids, decrease fatty foods, increase activity.  - Lipid panel  Vitamin D deficiency -cont supplement - Vit D  25 hydroxy (rtn osteoporosis monitoring)  Osteoporosis -Vit D and calcium recommended - last DEXA 2016, in osteopenia range, get DEXA  Morbid Obesity -diet and exercise discussed    Medication management - CBC with Differential/Platelet - BASIC METABOLIC PANEL WITH GFR - Hepatic function panel - Magnesium  Prediabetes - Hemoglobin A1c - Insulin, random  Elevated BP - Urinalysis, Routine w reflex microscopic (not at [facility]) - Microalbumin / creatinine urine ratio - EKG 12-Lead  Shoulder pain ***   Discussed med's effects and SE's. Screening labs and tests as requested with regular follow-up as recommended. Future Appointments  Date Time Provider [place]  07/30/2017 10:00 AM Liane Comber, NP [facility] None  08/11/2017 11:30 AM [facility] [place] [facility]    HPI  62 y.o. female  presents for a complete physical. She has Palpitations; Hyperlipidemia; Vitamin D deficiency; Hypothyroidism; Morbid obesity ([place]); Osteoporosis; Prediabetes; Elevated BP without diagnosis of hypertension; and Shoulder pain on their problem list.   Shoulder pain?   Needs follow ups ***  She has osteoporosis and is on vivelle dot.  Followed by GYN   BMI is There is no height or weight on file to calculate BMI., she {HAS HAS LFY:10175} been working on diet and exercise.  Wt  Readings from Last 3 Encounters:  04/01/17 199 lb (90.3 kg)  01/29/17 199 lb 3.2 oz (90.4 kg)  07/29/16 196 lb 3.2 oz (89 kg)   Her blood pressure has been controlled at home, today their BP is  .  She does workout. She denies chest pain, shortness of breath, dizziness.   She is on cholesterol medication and denies myalgias. Her cholesterol is not at goal. The cholesterol last visit was:  Lab Results  Component Value Date   CHOL 217 (H) 01/29/2017   HDL 59 01/29/2017   LDLCALC 133 (H) 01/29/2017   TRIG 142 01/29/2017   CHOLHDL 3.7 01/29/2017  . She has been working on diet and exercise for prediabetes,  and denies foot ulcerations, hyperglycemia, hypoglycemia , increased appetite, nausea, paresthesia of the feet, polydipsia, polyuria, visual disturbances, vomiting and weight loss. Last A1C in the office was:  Lab Results  Component Value Date   HGBA1C 5.8 (H) 01/29/2017   Patient is on Vitamin D supplement.   Lab Results  Component Value Date   VD25OH 29 (L) 01/29/2017     She is on thyroid medication. Her medication was not changed last visit.   Lab Results  Component Value Date   TSH 0.52 01/29/2017     Current Medications:  Current Outpatient Medications on File Prior to Visit  Medication Sig Dispense Refill  . b complex vitamins tablet Take 1 tablet by mouth daily.    . calcium-vitamin D (OSCAL WITH D) 250-125 MG-UNIT per tablet Take 1 tablet by mouth daily.    . Coenzyme Q10 (COQ10 PO) Take 1 tablet by mouth 2 (two) times daily.    Marland Kitchen estradiol (VIVELLE-DOT)  0.05 MG/24HR patch Place 1 patch onto the skin 2 (two) times a week.    . Flaxseed, Linseed, (FLAX SEED OIL PO) Take 2 capsules by mouth 2 (two) times daily.    Marland Kitchen gabapentin (NEURONTIN) 300 MG capsule TAKE 1-2 PILLS AT NIGHT FOR SHOULDER PAIN 180 capsule 2  . IRON PO Take 1 tablet by mouth daily.    Marland Kitchen levothyroxine (SYNTHROID, LEVOTHROID) 112 MCG tablet TAKE 1 TABLET ON EMPTY STOMACH WITH SIP OF WATER IN AM.WAIT  30MINS TO AN HOUR BEFORE EATING 90 tablet 1  . MAGNESIUM PO Take 1 tablet by mouth daily.    . meloxicam (MOBIC) 15 MG tablet 1 TAB DAILY WITH FOOD X 2 WKS CAN TAKE WITH TYLENOL,CAN'T TAKE ALEVE,IBUPROFEN THEN DAILY AS NEEDED 30 tablet 1  . metaxalone (SKELAXIN) 800 MG tablet Take 1 tablet (800 mg total) by mouth 3 (three) times daily. 30 tablet 1  . Multiple Vitamin (MULTIVITAMIN) capsule Take 1 capsule by mouth daily.    . Omega-3 Fatty Acids (FISH OIL PO) Take 2 capsules by mouth 2 (two) times daily.    . Red Yeast Rice Extract (RED YEAST RICE PO) Take 2 tablets by mouth 2 (two) times daily.     No current facility-administered medications on file prior to visit.     Health Maintenance:   Immunization History  Administered Date(s) Administered  . Influenza Split 11/06/2010  . Influenza, Seasonal, Injecte, Preservative Fre 01/29/2016  . Tdap 07/29/2016   Tetanus: 2018  Influenza 2018 Prevnar due 71 Pneumonia due 73 Shingles due 60 wants next OV ***  Pap 2013 goes to GYN, but has had hysterectomy MGM: 02/2016, Cat B, normal left breast US DEXA: 2014 DUE Colonoscopy: 12/29/2006 DUE [place] AB 2013  Last Dental Exam: Dr. Britta Mccreedy and Sarajane Jews Last Eye Exam: Dr. Delman Cheadle   Patient Care Team: [name], MD as PCP - General (Internal Medicine) Gatha Mayer, MD as Consulting Physician (Gastroenterology) Paula Compton, MD as Consulting Physician (Obstetrics and Gynecology) Druscilla Brownie, MD as Consulting Physician (Dermatology)  Medical History:  Past Medical History:  Diagnosis Date  . Colon polyp 12/29/2006  . Hyperlipidemia   . Hypothyroidism   . Obesity    BMI 34  . Osteoporosis   . Palpitations   . Vitamin D deficiency    Allergies Allergies  Allergen Reactions  . Ppd [Tuberculin Purified Protein Derivative]     Positive reaction.  Negative Chest Xray 06/16/12    SURGICAL HISTORY She  has a past surgical history that includes Abdominal hysterectomy  (1997); Cesarean section (1983); and Colonoscopy w/ biopsies (12/29/2006). FAMILY HISTORY Her family history includes Alcohol abuse in her father; Bone cancer in her maternal aunt; Breast cancer in her maternal aunt; Cancer (age of onset: 3) in her mother; Diabetes in her mother and paternal grandmother; Heart disease in her father and maternal aunt; Heart disease (age of onset: 27) in her mother; Hyperlipidemia in her brother and maternal aunt; Hypertension in her brother, father, and mother; Liver disease in her maternal grandmother and mother; Thyroid disease in her mother. SOCIAL HISTORY She  reports that she quit smoking about 21 years ago. Her smoking use included cigarettes. She quit after 15.00 years of use. She has never used smokeless tobacco. She reports that she does not drink alcohol or use drugs.   Review of Systems: Review of Systems  Constitutional: Negative for chills, fever, malaise/fatigue and weight loss.  HENT: Negative for congestion, ear pain and sore throat.  Eyes: Negative.   Respiratory: Negative for cough, shortness of breath and wheezing.   Cardiovascular: Negative for chest pain, palpitations and leg swelling.  Gastrointestinal: Negative for blood in stool, constipation, diarrhea, heartburn, melena, nausea and vomiting.  Genitourinary: Negative.   Musculoskeletal: Positive for joint pain (chronic shoulder pain). Negative for myalgias and neck pain.  Skin: Negative for itching.  Neurological: Negative for dizziness, tingling, sensory change, loss of consciousness and headaches.  Psychiatric/Behavioral: Negative for depression. The patient is not nervous/anxious and does not have insomnia.     Physical Exam: Estimated body mass index is 35.25 kg/m as calculated from the following:   Height as of 04/01/17: 5\' 3"  (1.6 m).   Weight as of 04/01/17: 199 lb (90.3 kg). There were no vitals taken for this visit.  General Appearance: Well nourished well developed, in no  apparent distress.  Eyes: PERRLA, EOMs, conjunctiva no swelling or erythema ENT/Mouth: Ear canals normal without obstruction, swelling, erythema, or discharge.  TMs normal bilaterally with no erythema, bulging, retraction, or loss of landmark.  Oropharynx moist and clear with no exudate, erythema, or swelling.   Neck: Supple, thyroid normal. No bruits.  No cervical adenopathy Respiratory: Respiratory effort normal, Breath sounds clear A&P without wheeze, rhonchi, rales.   Cardio: RRR without murmurs, rubs or gallops, occ extra beat. Brisk peripheral pulses without edema.  Chest: symmetric, with normal excursions Breasts: Symmetric, without lumps, nipple discharge, retractions.  Abdomen: Morbidly obese, Soft, nontender, no guarding, rebound, hernias, masses, or organomegaly.  Lymphatics: Non tender without lymphadenopathy.  Musculoskeletal: Full ROM all peripheral extremities,5/5 strength, and normal gait.  Skin: Warm, dry without rashes, lesions, ecchymosis.Right ankle with tenderness over lateral superior malleolus, some swelling no erythema, no ecchymosis, no effusion and full range of motion. Neurological Exam: normal Vascular Exam: normal, has very high arch  Neuro: Awake and oriented X 3, Cranial nerves intact, reflexes equal bilaterally. Normal muscle tone, no cerebellar symptoms. Sensation intact.  Psych:  normal affect, Insight and Judgment appropriate.   EKG: WNL, PRWP no ST changes. AORTA SCAN: defer  Over 40 minutes of exam, counseling, chart review and critical decision making was performed  Izora Ribas 1:55 PM [facility]

## 2017-07-30 ENCOUNTER — Encounter: Payer: Self-pay | Admitting: Physician Assistant

## 2017-07-30 ENCOUNTER — Encounter: Payer: Self-pay | Admitting: Adult Health

## 2017-08-11 ENCOUNTER — Ambulatory Visit
Admission: RE | Admit: 2017-08-11 | Discharge: 2017-08-11 | Disposition: A | Discharge status: Home / Self Care | Payer: BLUE CROSS/BLUE SHIELD | Source: Ambulatory Visit | Attending: Nephrology | Admitting: Nephrology

## 2017-08-11 DIAGNOSIS — N183 Chronic kidney disease, stage 3 unspecified: Secondary | ICD-10-CM

## 2017-08-12 DIAGNOSIS — N951 Menopausal and female climacteric states: Secondary | ICD-10-CM | POA: Diagnosis not present

## 2017-08-12 DIAGNOSIS — E039 Hypothyroidism, unspecified: Secondary | ICD-10-CM | POA: Diagnosis not present

## 2017-08-12 DIAGNOSIS — N289 Disorder of kidney and ureter, unspecified: Secondary | ICD-10-CM | POA: Diagnosis not present

## 2017-08-12 DIAGNOSIS — R5383 Other fatigue: Secondary | ICD-10-CM | POA: Diagnosis not present

## 2017-08-21 ENCOUNTER — Other Ambulatory Visit: Payer: Self-pay | Admitting: Nephrology

## 2017-08-21 DIAGNOSIS — R030 Elevated blood-pressure reading, without diagnosis of hypertension: Secondary | ICD-10-CM

## 2017-08-21 DIAGNOSIS — N183 Chronic kidney disease, stage 3 unspecified: Secondary | ICD-10-CM

## 2018-01-07 DIAGNOSIS — C8310 Mantle cell lymphoma, unspecified site: Secondary | ICD-10-CM

## 2018-01-07 HISTORY — DX: Mantle cell lymphoma, unspecified site: C83.10

## 2018-01-15 DIAGNOSIS — N183 Chronic kidney disease, stage 3 (moderate): Secondary | ICD-10-CM | POA: Diagnosis not present

## 2018-01-15 DIAGNOSIS — R5383 Other fatigue: Secondary | ICD-10-CM | POA: Diagnosis not present

## 2018-01-15 DIAGNOSIS — N921 Excessive and frequent menstruation with irregular cycle: Secondary | ICD-10-CM | POA: Diagnosis not present

## 2018-01-15 DIAGNOSIS — E039 Hypothyroidism, unspecified: Secondary | ICD-10-CM | POA: Diagnosis not present

## 2018-01-15 DIAGNOSIS — E559 Vitamin D deficiency, unspecified: Secondary | ICD-10-CM | POA: Diagnosis not present

## 2018-02-02 ENCOUNTER — Ambulatory Visit: Payer: BLUE CROSS/BLUE SHIELD | Admitting: Adult Health

## 2018-02-02 ENCOUNTER — Other Ambulatory Visit: Payer: Self-pay | Admitting: Adult Health

## 2018-02-02 ENCOUNTER — Encounter: Payer: Self-pay | Admitting: Adult Health

## 2018-02-02 ENCOUNTER — Ambulatory Visit: Payer: Self-pay | Admitting: Adult Health

## 2018-02-02 VITALS — BP 140/82 | HR 111 | Temp 97.5°F | Ht 63.0 in | Wt 203.0 lb

## 2018-02-02 DIAGNOSIS — R030 Elevated blood-pressure reading, without diagnosis of hypertension: Secondary | ICD-10-CM | POA: Diagnosis not present

## 2018-02-02 DIAGNOSIS — N183 Chronic kidney disease, stage 3 unspecified: Secondary | ICD-10-CM

## 2018-02-02 DIAGNOSIS — J069 Acute upper respiratory infection, unspecified: Secondary | ICD-10-CM

## 2018-02-02 DIAGNOSIS — E039 Hypothyroidism, unspecified: Secondary | ICD-10-CM | POA: Diagnosis not present

## 2018-02-02 DIAGNOSIS — E785 Hyperlipidemia, unspecified: Secondary | ICD-10-CM | POA: Diagnosis not present

## 2018-02-02 DIAGNOSIS — E559 Vitamin D deficiency, unspecified: Secondary | ICD-10-CM

## 2018-02-02 DIAGNOSIS — R7303 Prediabetes: Secondary | ICD-10-CM

## 2018-02-02 DIAGNOSIS — Z79899 Other long term (current) drug therapy: Secondary | ICD-10-CM

## 2018-02-02 MED ORDER — AZITHROMYCIN 250 MG PO TABS: ORAL_TABLET | ORAL | 1 refills | Status: AC

## 2018-02-02 MED ORDER — PROMETHAZINE-DM 6.25-15 MG/5ML PO SYRP: 5.0000 mL | ORAL_SOLUTION | Freq: Four times a day (QID) | ORAL | 1 refills | Status: DC | PRN

## 2018-02-02 MED ORDER — PREDNISONE 20 MG PO TABS: ORAL_TABLET | ORAL | 0 refills | Status: DC

## 2018-02-02 NOTE — Progress Notes (Signed)
Assessment and Plan:  Natalie Baker was seen today for acute visit.  Diagnoses and all orders for this visit:  Hypothyroidism, unspecified type continue medications the same pending lab results reminded to take on an empty stomach 30-75mins before food.  check TSH level -     TSH  Prediabetes Discussed disease and risks Discussed diet/exercise, weight management  A1C -     Hemoglobin A1c  Elevated BP without diagnosis of hypertension Not on meds, typically well controlled - ? R/t URI Monitor blood pressure at home; call if consistently over 130/80 Continue DASH diet.   Reminder to go to the ER if any CP, SOB, nausea, dizziness, severe HA, changes vision/speech, left arm numbness and tingling and jaw pain.  Morbid obesity ([place]) Long discussion about weight loss, diet, and exercise Recommended diet heavy in fruits and veggies and low in animal meats, cheeses, and dairy products, appropriate calorie intake Discussed appropriate weight for height and initial goal (195lb) Follow up at next visit  Hyperlipidemia, unspecified hyperlipidemia type Not currently on medications, discussed LDL goal <100, unless severely elevated today, she would prefer to defer starting meds and work on lifestyle, and recheck at [place] low cholesterol diet and exercise.  Check lipid panel.  -     Lipid panel -     TSH  Vitamin D deficiency Defer vitamin D check today; restart on daily supplement - 2000 IU daily Check at CPE  Medication management -     CBC with Differential/Platelet -     COMPLETE METABOLIC PANEL WITH GFR  Acute URI Nonspecific; will proceed with abx due to progressive symptoms Suggested symptomatic OTC remedies. Nasal saline spray for congestion. Nasal steroids, rotate allergy pill, oral steroids offered Follow up as needed. -     azithromycin (ZITHROMAX) 250 MG tablet; Take 2 tablets (500 mg) on  Day 1,  followed by 1 tablet (250 mg) once daily on Days 2 through 5. -      predniSONE (DELTASONE) 20 MG tablet; 2 tablets daily for 3 days, 1 tablet daily for 4 days. -     promethazine-dextromethorphan (PROMETHAZINE-DM) 6.25-15 MG/5ML syrup; Take 5 mLs by mouth 4 (four) times daily as needed for cough.   Further disposition pending results of labs. Discussed med's effects and SE's.   Over 30 minutes of exam, counseling, chart review, and critical decision making was performed.   Future Appointments  Date Time Provider [place]  08/05/2018  3:00 PM Liane Comber, NP [facility] None    ------------------------------------------------------------------------------------------------------------------   HPI BP 140/82   Pulse (!) 111   Temp (!) 97.5 F (36.4 C)   Ht 5\' 3"  (1.6 m)   Wt 203 lb (92.1 kg)   SpO2 97%   BMI 35.96 kg/m   62 y.o.female presents for 1 week of URI sx; she reports began 6 days ago with tickle in throat, gradually became worse, starting to have a headache. Denies sore thoat, she does have hoarseness, she does endorse nasal congestion, facial pressure. She started coughing 3 days ago, newly productive today productive of thick brown/yellow in small quantities. Husband has been ill as well, but running a fever. She denies fever/chills. Denies fatigue, CP, palpitations, dyspnea.   She has been taking tylenol for headache, tylenol cold/sinus. She takes zyrtec daily year around for allergies.   She did have the flu vaccine   She is overdue for follow up on chronic health conditions; she has prediabetes, hypothyroid, morbid obesity, CKD III (reportedly followed by  nephrology)  BMI is Body mass index is 35.96 kg/m., she has not been working on diet and exercise. Wt Readings from Last 3 Encounters:  02/02/18 203 lb (92.1 kg)  04/01/17 199 lb (90.3 kg)  01/29/17 199 lb 3.2 oz (90.4 kg)   She has not been checking blood pressure but has cuff if needed, today their BP is BP: 140/82  She does not workout. She denies chest pain,  shortness of breath, dizziness.   She is not on cholesterol medication (on RYRS and CoQ10 for several years) and denies myalgias. Her cholesterol is not at goal. The cholesterol last visit was:   Lab Results  Component Value Date   CHOL 217 (H) 01/29/2017   HDL 59 01/29/2017   LDLCALC 133 (H) 01/29/2017   TRIG 142 01/29/2017   CHOLHDL 3.7 01/29/2017    She has not been working on diet and exercise for prediabetes, and denies increased appetite, nausea, paresthesia of the feet, polydipsia, polyuria, visual disturbances and vomiting. Last A1C in the office was:  Lab Results  Component Value Date   HGBA1C 5.8 (H) 01/29/2017   She is on thyroid medication. Her medication was not changed last visit.   Lab Results  Component Value Date   TSH 0.52 01/29/2017   Patient is not currently on Vitamin D supplement.   Lab Results  Component Value Date   VD25OH 29 (L) 01/29/2017       Lab Results  Component Value Date   GFRNONAA 29 (L) 04/01/2017       Past Medical History:  Diagnosis Date  . Colon polyp 12/29/2006  . Hyperlipidemia   . Hypothyroidism   . Obesity    BMI 34  . Osteoporosis   . Palpitations   . Vitamin D deficiency      Allergies  Allergen Reactions  . Ppd [Tuberculin Purified Protein Derivative]     Positive reaction.  Negative Chest Xray 06/16/12    Current Outpatient Medications on File Prior to Visit  Medication Sig  . b complex vitamins tablet Take 1 tablet by mouth daily.  . calcium-vitamin D (OSCAL WITH D) 250-125 MG-UNIT per tablet Take 1 tablet by mouth daily.  . Coenzyme Q10 (COQ10 PO) Take 1 tablet by mouth 2 (two) times daily.  Marland Kitchen estradiol (VIVELLE-DOT) 0.05 MG/24HR patch Place 1 patch onto the skin 2 (two) times a week.  . Flaxseed, Linseed, (FLAX SEED OIL PO) Take 2 capsules by mouth 2 (two) times daily.  . IRON PO Take 1 tablet by mouth daily.  Marland Kitchen levothyroxine (SYNTHROID, LEVOTHROID) 112 MCG tablet TAKE 1 TABLET ON EMPTY STOMACH WITH SIP OF  WATER IN AM.WAIT 30MINS TO AN HOUR BEFORE EATING  . MAGNESIUM PO Take 1 tablet by mouth daily.  . metaxalone (SKELAXIN) 800 MG tablet Take 1 tablet (800 mg total) by mouth 3 (three) times daily.  . Multiple Vitamin (MULTIVITAMIN) capsule Take 1 capsule by mouth daily.  . Omega-3 Fatty Acids (FISH OIL PO) Take 2 capsules by mouth 2 (two) times daily.  . Red Yeast Rice Extract (RED YEAST RICE PO) Take 2 tablets by mouth 2 (two) times daily.   No current facility-administered medications on file prior to visit.     ROS: Review of Systems  Constitutional: Negative for chills, diaphoresis, fever, malaise/fatigue and weight loss.  HENT: Positive for congestion. Negative for hearing loss, sinus pain, sore throat and tinnitus.   Eyes: Negative for blurred vision and double vision.  Respiratory: Positive for cough  and sputum production. Negative for hemoptysis, shortness of breath and wheezing.   Cardiovascular: Negative for chest pain, palpitations, orthopnea, claudication and leg swelling.  Gastrointestinal: Negative for abdominal pain, blood in stool, constipation, diarrhea, heartburn, melena, nausea and vomiting.  Genitourinary: Negative.   Musculoskeletal: Negative for joint pain and myalgias.  Skin: Negative for rash.  Neurological: Negative for dizziness, tingling, sensory change, weakness and headaches.  Endo/Heme/Allergies: Positive for environmental allergies. Negative for polydipsia.  Psychiatric/Behavioral: Negative.   All other systems reviewed and are negative.    Physical Exam:  BP 140/82   Pulse (!) 111   Temp (!) 97.5 F (36.4 C)   Ht 5\' 3"  (1.6 m)   Wt 203 lb (92.1 kg)   SpO2 97%   BMI 35.96 kg/m   General Appearance: Well nourished, in no apparent distress, appears flushed Eyes: PERRLA, EOMs, conjunctiva no swelling or erythema Sinuses: No Frontal/maxillary tenderness ENT/Mouth: Ext aud canals clear, TMs without erythema, bulging. No erythema, swelling, or exudate  on post pharynx.  Tonsils not swollen or erythematous. Hearing normal. Occasional dry cough in office.  Neck: Supple, thyroid normal.  Respiratory: Respiratory effort normal, BS equal bilaterally without rales, rhonchi, wheezing or stridor.  Cardio: RRR with no MRGs. Brisk peripheral pulses without edema.  Abdomen: Soft, + BS.  Non tender, no guarding, rebound, hernias, masses. Lymphatics: Non tender without lymphadenopathy.  Musculoskeletal: Full ROM, 5/5 strength, normal gait.  Skin: Warm, dry without rashes, lesions, ecchymosis.  Neuro: Cranial nerves intact. Normal muscle tone, no cerebellar symptoms. Sensation intact.  Psych: Awake and oriented X 3, normal affect, Insight and Judgment appropriate.     Izora Ribas, NP 12:16 PM [facility]

## 2018-02-02 NOTE — Patient Instructions (Addendum)
Goals    . Blood Pressure < 130/80    . DIET - INCREASE WATER INTAKE     65-80+ fluid ounces daily     . LDL CALC < 100    . Weight (lb) < 195 lb (88.5 kg)       Push water intake  GO to ER if you have chest pain or shortness of breath  Call me if not getting better  Try allegra (fexofenadine) instead of zyrtec for a few weeks to give it a break and help it work better during allergy season     HOW TO TREAT VIRAL COUGH AND COLD SYMPTOMS:  -Symptoms usually last at least 1 week with the worst symptoms being around day 4.  - colds usually start with a sore throat and end with a cough, and the cough can take 2 weeks to get better.  -No antibiotics are needed for colds, flu, sore throats, cough, bronchitis UNLESS symptoms are longer than 7 days OR if you are getting better then get drastically worse.  -There are a lot of combination medications (Dayquil, Nyquil, Vicks 44, tyelnol cold and sinus, ETC). Please look at the ingredients on the back so that you are treating the correct symptoms and not doubling up on medications/ingredients.    Medicines you can use  Nasal congestion  Little Remedies saline spray (aerosol/mist)- can try this, it is in the kids section - pseudoephedrine (Sudafed)- behind the counter, do not use if you have high blood pressure, medicine that have -D in them.  - phenylephrine (Sudafed PE) -Dextormethorphan + chlorpheniramine (Coridcidin HBP)- okay if you have high blood pressure -Oxymetazoline (Afrin) nasal spray- LIMIT to 3 days -Saline nasal spray -Neti pot (used distilled or bottled water)  Ear pain/congestion  -pseudoephedrine (sudafed) - Nasonex/flonase nasal spray  Fever  -Acetaminophen (Tyelnol) -Ibuprofen (Advil, motrin, aleve)  Sore Throat  -Acetaminophen (Tyelnol) -Ibuprofen (Advil, motrin, aleve) -Drink a lot of water -Gargle with salt water - Rest your voice (don't talk) -Throat sprays -Cough drops  Body Aches  -Acetaminophen  (Tyelnol) -Ibuprofen (Advil, motrin, aleve)  Headache  -Acetaminophen (Tyelnol) -Ibuprofen (Advil, motrin, aleve) - Exedrin, Exedrin Migraine  Allergy symptoms (cough, sneeze, runny nose, itchy eyes) -Claritin or loratadine cheapest but likely the weakest  -Zyrtec or certizine at night because it can make you sleepy -The strongest is allegra or fexafinadine  Cheapest at [employer], [employer], [employer]  Cough  -Dextromethorphan (Delsym)- medicine that has DM in it -Guafenesin (Mucinex/Robitussin) - cough drops - drink lots of water  Chest Congestion  -Guafenesin (Mucinex/Robitussin)  Red Itchy Eyes  - Naphcon-A  Upset Stomach  - Bland diet (nothing spicy, greasy, fried, and high acid foods like tomatoes, oranges, berries) -OKAY- cereal, bread, soup, crackers, rice -Eat smaller more frequent meals -reduce caffeine, no alcohol -Loperamide (Imodium-AD) if diarrhea -Prevacid for heart burn  General health when sick  -Hydration -wash your hands frequently -keep surfaces clean -change pillow cases and sheets often -Get fresh air but do not exercise strenuously -Vitamin D, double up on it - Vitamin C -Zinc

## 2018-02-03 LAB — COMPLETE METABOLIC PANEL WITH GFR
AG Ratio: 2 (calc) (ref 1.0–2.5)
ALT: 20 U/L (ref 6–29)
AST: 20 U/L (ref 10–35)
Albumin: 4.9 g/dL (ref 3.6–5.1)
Alkaline phosphatase (APISO): 79 U/L (ref 33–130)
BILIRUBIN TOTAL: 0.3 mg/dL (ref 0.2–1.2)
BUN: 9 mg/dL (ref 7–25)
CO2: 29 mmol/L (ref 20–32)
Calcium: 9.9 mg/dL (ref 8.6–10.4)
Chloride: 101 mmol/L (ref 98–110)
Creat: 0.84 mg/dL (ref 0.50–0.99)
GFR, Est African American: 86 mL/min/{1.73_m2} (ref >=60)
GFR, Est Non African American: 74 mL/min/{1.73_m2} (ref >=60)
Globulin: 2.5 g/dL (calc) (ref 1.9–3.7)
Glucose, Bld: 101 mg/dL — ABNORMAL HIGH (ref 65–99)
Potassium: 3.9 mmol/L (ref 3.5–5.3)
Sodium: 140 mmol/L (ref 135–146)
Total Protein: 7.4 g/dL (ref 6.1–8.1)

## 2018-02-03 LAB — CBC WITH DIFFERENTIAL/PLATELET
Absolute Monocytes: 728 cells/uL (ref 200–950)
Basophils Absolute: 41 cells/uL (ref 0–200)
Basophils Relative: 0.6 %
Eosinophils Absolute: 82 cells/uL (ref 15–500)
Eosinophils Relative: 1.2 %
HCT: 45.1 % — ABNORMAL HIGH (ref 35.0–45.0)
Hemoglobin: 14.9 g/dL (ref 11.7–15.5)
Lymphs Abs: 1897 cells/uL (ref 850–3900)
MCH: 27.3 pg (ref 27.0–33.0)
MCHC: 33 g/dL (ref 32.0–36.0)
MCV: 82.6 fL (ref 80.0–100.0)
MPV: 10.4 fL (ref 7.5–12.5)
Monocytes Relative: 10.7 %
Neutro Abs: 4053 cells/uL (ref 1500–7800)
Neutrophils Relative %: 59.6 %
PLATELETS: 251 10*3/uL (ref 140–400)
RBC: 5.46 10*6/uL — ABNORMAL HIGH (ref 3.80–5.10)
RDW: 13.6 % (ref 11.0–15.0)
TOTAL LYMPHOCYTE: 27.9 %
WBC: 6.8 10*3/uL (ref 3.8–10.8)

## 2018-02-03 LAB — HEMOGLOBIN A1C
Hgb A1c MFr Bld: 5.7 % of total Hgb — ABNORMAL HIGH (ref <5.7)
Mean Plasma Glucose: 117 (calc)
eAG (mmol/L): 6.5 (calc)

## 2018-02-03 LAB — LIPID PANEL
Cholesterol: 218 mg/dL — ABNORMAL HIGH (ref <200)
HDL: 54 mg/dL (ref >50)
LDL Cholesterol (Calc): 141 mg/dL (calc) — ABNORMAL HIGH
Non-HDL Cholesterol (Calc): 164 mg/dL (calc) — ABNORMAL HIGH (ref <130)
Total CHOL/HDL Ratio: 4 (calc) (ref <5.0)
Triglycerides: 117 mg/dL (ref <150)

## 2018-02-03 LAB — TSH: TSH: 2.52 m[IU]/L (ref 0.40–4.50)

## 2018-02-06 ENCOUNTER — Telehealth: Payer: Self-pay

## 2018-02-06 ENCOUNTER — Other Ambulatory Visit: Payer: Self-pay | Admitting: Adult Health

## 2018-02-06 MED ORDER — HYDROCODONE-HOMATROPINE 5-1.5 MG/5ML PO SYRP: 5.0000 mL | ORAL_SOLUTION | Freq: Four times a day (QID) | ORAL | 0 refills | Status: DC | PRN

## 2018-02-06 MED ORDER — BENZONATATE 100 MG PO CAPS: 200.0000 mg | ORAL_CAPSULE | Freq: Three times a day (TID) | ORAL | 0 refills | Status: DC | PRN

## 2018-02-06 NOTE — Telephone Encounter (Signed)
Cough medication is on backorder for 6 months. Delsym isn't working. Requesting something else be sent in, she isn't getting any rest.

## 2018-02-09 NOTE — Telephone Encounter (Signed)
Patient aware.

## 2018-02-12 ENCOUNTER — Encounter: Payer: Self-pay | Admitting: Internal Medicine

## 2018-03-06 ENCOUNTER — Encounter: Payer: Self-pay | Admitting: Internal Medicine

## 2018-03-06 ENCOUNTER — Ambulatory Visit (AMBULATORY_SURGERY_CENTER): Payer: Self-pay | Admitting: *Deleted

## 2018-03-06 VITALS — Ht 62.0 in | Wt 203.0 lb

## 2018-03-06 DIAGNOSIS — Z1211 Encounter for screening for malignant neoplasm of colon: Secondary | ICD-10-CM

## 2018-03-06 NOTE — Progress Notes (Signed)
No egg or soy allergy known to patient  No issues with past sedation with any surgeries  or procedures, no intubation problems  No diet pills per patient No home 02 use per patient  No blood thinners per patient  Pt denies issues with constipation  No A fib or A flutter  EMMI video sent to pt's e mail - declined   

## 2018-03-20 ENCOUNTER — Other Ambulatory Visit: Payer: Self-pay

## 2018-03-20 ENCOUNTER — Ambulatory Visit (AMBULATORY_SURGERY_CENTER): Payer: BLUE CROSS/BLUE SHIELD | Admitting: Internal Medicine

## 2018-03-20 ENCOUNTER — Encounter: Payer: Self-pay | Admitting: Internal Medicine

## 2018-03-20 VITALS — BP 121/52 | HR 80 | Temp 97.5°F | Resp 18 | Ht 62.0 in | Wt 203.0 lb

## 2018-03-20 DIAGNOSIS — Z1211 Encounter for screening for malignant neoplasm of colon: Secondary | ICD-10-CM

## 2018-03-20 MED ORDER — SODIUM CHLORIDE 0.9 % IV SOLN: 500.0000 mL | Freq: Once | INTRAVENOUS | Status: DC

## 2018-03-20 NOTE — Progress Notes (Signed)
Pt's states no medical or surgical changes since previsit or office visit. 

## 2018-03-20 NOTE — Progress Notes (Signed)
Pt awke . VSS. Report given to RN. No anesthetic complications noted

## 2018-03-20 NOTE — Patient Instructions (Addendum)
   No polyps!  Next routine colonoscopy or other screening test in 10 years - 2030  I appreciate the opportunity to care for you. Gatha Mayer, MD, FACG  YOU HAD AN ENDOSCOPIC PROCEDURE TODAY AT [facility]:   Refer to the procedure report that was given to you for any specific questions about what was found during the examination.  If the procedure report does not answer your questions, please call your gastroenterologist to clarify.  If you requested that your care partner not be given the details of your procedure findings, then the procedure report has been included in a sealed envelope for you to review at your convenience later.  YOU SHOULD EXPECT: Some feelings of bloating in the abdomen. Passage of more gas than usual.  Walking can help get rid of the air that was put into your GI tract during the procedure and reduce the bloating. If you had a lower endoscopy (such as a colonoscopy or flexible sigmoidoscopy) you may notice spotting of blood in your stool or on the toilet paper. If you underwent a bowel prep for your procedure, you may not have a normal bowel movement for a few days.  Please Note:  You might notice some irritation and congestion in your nose or some drainage.  This is from the oxygen used during your procedure.  There is no need for concern and it should clear up in a day or so.  SYMPTOMS TO REPORT IMMEDIATELY:   Following lower endoscopy (colonoscopy or flexible sigmoidoscopy):  Excessive amounts of blood in the stool  Significant tenderness or worsening of abdominal pains  Swelling of the abdomen that is new, acute  Fever of 100F or higher  For urgent or emergent issues, a gastroenterologist can be reached at any hour by calling [phone].   DIET:  We do recommend a small meal at first, but then you may proceed to your regular diet.  Drink plenty of fluids but you should avoid alcoholic beverages for 24 hours.  ACTIVITY:  You  should plan to take it easy for the rest of today and you should NOT DRIVE or use heavy machinery until tomorrow (because of the sedation medicines used during the test).    FOLLOW UP: Our staff will call the number listed on your records the next business day following your procedure to check on you and address any questions or concerns that you may have regarding the information given to you following your procedure. If we do not reach you, we will leave a message.  However, if you are feeling well and you are not experiencing any problems, there is no need to return our call.  We will assume that you have returned to your regular daily activities without incident.   SIGNATURES/CONFIDENTIALITY: You and/or your care partner have signed paperwork which will be entered into your electronic medical record.  These signatures attest to the fact that that the information above on your After Visit Summary has been reviewed and is understood.  Full responsibility of the confidentiality of this discharge information lies with you and/or your care-partner.  Continue your normal medications  Next colonoscopy-10 years  Please read over handout about diverticulosis

## 2018-03-20 NOTE — Op Note (Signed)
[place] Patient Name: Natalie Baker Procedure Date: 03/20/2018 8:41 AM MRN: [id number] Endoscopist: Gatha Mayer , MD Age: 63 Referring MD:  Date of Birth: 24-Jul-1955 Gender: Female Account #: [id number] Procedure:                Colonoscopy Indications:              Screening for colorectal malignant neoplasm Medicines:                Propofol per Anesthesia, Monitored Anesthesia Care Procedure:                Pre-Anesthesia Assessment:                           - Prior to the procedure, a History and Physical                            was performed, and patient medications and                            allergies were reviewed. The patient's tolerance of                            previous anesthesia was also reviewed. The risks                            and benefits of the procedure and the sedation                            options and risks were discussed with the patient.                            All questions were answered, and informed consent                            was obtained. Prior Anticoagulants: The patient has                            taken no previous anticoagulant or antiplatelet                            agents. ASA Grade Assessment: II - A patient with                            mild systemic disease. After reviewing the risks                            and benefits, the patient was deemed in                            satisfactory condition to undergo the procedure.                           After obtaining informed consent, the colonoscope  was passed under direct vision. Throughout the                            procedure, the patient's blood pressure, pulse, and                            oxygen saturations were monitored continuously. The                            Colonoscope was introduced through the anus and                            advanced to the the cecum, identified by                             appendiceal orifice and ileocecal valve. The                            ileocecal valve, appendiceal orifice, and rectum                            were photographed. The quality of the bowel                            preparation was excellent. The bowel preparation                            used was Miralax. Scope In: 8:47:45 AM Scope Out: 9:01:55 AM Scope Withdrawal Time: 0 hours 11 minutes 12 seconds  Total Procedure Duration: 0 hours 14 minutes 10 seconds  Findings:                 A few small-mouthed diverticula were found in the                            sigmoid colon.                           The colon exam was otherwise without abnormality on                            direct and retroflexion views.                           Skin tags were found on perianal exam.                           The digital rectal exam was normal. Complications:            No immediate complications. Estimated blood loss:                            None. Estimated Blood Loss:     Estimated blood loss: none. Impression:               - Mild diverticulosis in the sigmoid colon.                           -  The colon examination was otherwise normal on                            direct and retroflexion views.                           - Perianal skin tags found on perianal exam.                           - No specimens collected. Recommendation:           - Repeat colonoscopy in 10 years for screening                            purposes.                           - Resume previous diet.                           - Continue present medications. Gatha Mayer, MD 03/20/2018 9:06:14 AM This report has been signed electronically.

## 2018-03-23 ENCOUNTER — Telehealth: Payer: Self-pay

## 2018-03-23 NOTE — Telephone Encounter (Signed)
  Follow up Call-  Call back number 03/20/2018  Post procedure Call Back phone  # [phone]  Permission to leave phone message Yes  Some recent data might be hidden     Patient questions:  Do you have a fever, pain , or abdominal swelling? No. Pain Score  0 *  Have you tolerated food without any problems? Yes.    Have you been able to return to your normal activities? Yes.    Do you have any questions about your discharge instructions: Diet   No. Medications  No. Follow up visit  No.  Do you have questions or concerns about your Care? No.  Actions: * If pain score is 4 or above: No action needed, pain <4.  No problems noted per pt. maw

## 2018-04-13 DIAGNOSIS — R5383 Other fatigue: Secondary | ICD-10-CM | POA: Diagnosis not present

## 2018-04-13 DIAGNOSIS — E039 Hypothyroidism, unspecified: Secondary | ICD-10-CM | POA: Diagnosis not present

## 2018-04-13 DIAGNOSIS — N951 Menopausal and female climacteric states: Secondary | ICD-10-CM | POA: Diagnosis not present

## 2018-04-13 DIAGNOSIS — E559 Vitamin D deficiency, unspecified: Secondary | ICD-10-CM | POA: Diagnosis not present

## 2018-05-25 NOTE — Progress Notes (Signed)
Assessment and Plan:  Natalie Baker was seen today for jaw pain.  Diagnoses and all orders for this visit:  Right acute serous otitis media, recurrence not specified -     amoxicillin-clavulanate (AUGMENTIN) 875-125 MG tablet; Take 1 tablet by mouth 2 (two) times daily. 10 days May take ibuprofen 600mg  every 6 hours OR 800mg  every 8 hours  Seasonal allergic rhinitis due to pollen Discussed OTC antihistamine, Zyrtec 10mg  nightly If not improvement consider adding fluticasone Increase water intake  Elevated BP without diagnosis of hypertension Likely related to pain/discomfort Monitor blood pressure at home  Discussed goal   Patient agrees with plan of care.  Contact office with new or worsening symptoms as discussed in appointment. Discussed med's effects and SE's.   Over 30 minutes of exam, counseling, chart review, and critical decision making was performed.   Future Appointments  Date Time Provider [place]  08/05/2018  3:00 PM Liane Comber, NP [facility] None    ------------------------------------------------------------------------------------------------------------------   HPI 63 y.o.female presents for jaw pain that began three days ago in the afternoon with sudden onset.  She was outside trimming the bushes and concerned that the pollen was bothering her.  Reports that the right side of her jaw is sore and itextends down the side of her jaw to tip of chin.  She also reports that around her ear is tending Denies sore throught.  Endorses ear fullness that comes and goes.  This is also accompanied by water eyes and itchy nose.  She does her an audible click on the right. Denis pain with eating or swallowing.  Denies sore throat, lesions, cough, headache, change in vision, dizziness, nasal congestion.    Past Medical History:  Diagnosis Date  . Allergy   . Colon polyp 12/29/2006  . Hyperlipidemia   . Hypothyroidism   . Low kidney function   . Obesity    BMI 34   . Osteoporosis   . Palpitations   . Vitamin D deficiency      Allergies  Allergen Reactions  . Ppd [Tuberculin Purified Protein Derivative]     Positive reaction.  Negative Chest Xray 06/16/12    Current Outpatient Medications on File Prior to Visit  Medication Sig  . b complex vitamins tablet Take 1 tablet by mouth daily.  . Biotin 1000 MCG CHEW Chew 5,000 mcg by mouth daily.  . cholecalciferol (VITAMIN D3) 25 MCG (1000 UT) tablet Take 5,000 Units by mouth daily.  . Coenzyme Q10 (COQ10 PO) Take 1 tablet by mouth 2 (two) times daily.  Marland Kitchen estradiol (VIVELLE-DOT) 0.05 MG/24HR patch Place 1 patch onto the skin 2 (two) times a week.  . metaxalone (SKELAXIN) 800 MG tablet Take 1 tablet (800 mg total) by mouth 3 (three) times daily.  Marland Kitchen NATURE-THROID 65 MG tablet   . OVER THE COUNTER MEDICATION daily. Tumeric  . progesterone (PROMETRIUM) 100 MG capsule   . Flaxseed, Linseed, (FLAX SEED OIL PO) Take 2 capsules by mouth 2 (two) times daily.   No current facility-administered medications on file prior to visit.     ROS: Review of Systems  Constitutional: Negative for chills, diaphoresis, fever, malaise/fatigue and weight loss.  Gastrointestinal: Negative for abdominal pain, diarrhea, nausea and vomiting.  Skin: Negative for rash.    Physical Exam:  BP (!) 150/90   Pulse 94   Temp 97.7 F (36.5 C)   Ht 5\' 2"  (1.575 m)   Wt 207 lb (93.9 kg)   SpO2 93%   BMI 37.86  kg/m   General Appearance: Well nourished, in no apparent distress. Eyes: PERRLA, EOMs, conjunctiva no swelling or erythema Sinuses: No Frontal/maxillary tenderness ENT/Mouth: Ext aud canals clear, TMs without erythema, bulging on left serous noted behind TM.  Right TM bulging serous noted behind TM. TMJ no audible clicking or crepitus. No erythema, swelling, or exudate on post pharynx.  Tonsils not swollen or erythematous. Hearing normal.  Neck: Supple, thyroid normal.  Respiratory: Respiratory effort normal, BS equal  bilaterally without rales, rhonchi, wheezing or stridor.  Cardio: RRR with no MRGs. Brisk peripheral pulses without edema.  Lymphatics: Non tender without lymphadenopathy.  Musculoskeletal: Full ROM, 5/5 strength, normal gait.  Skin: Warm, dry without rashes, lesions, ecchymosis.  Neuro: Cranial nerves intact. Normal muscle tone, no cerebellar symptoms. Sensation intact.  Psych: Awake and oriented X 3, normal affect, Insight and Judgment appropriate.     Garnet Sierras, NP 10:39 AM [facility]

## 2018-05-26 ENCOUNTER — Encounter: Payer: Self-pay | Admitting: Adult Health Nurse Practitioner

## 2018-05-26 ENCOUNTER — Other Ambulatory Visit: Payer: Self-pay

## 2018-05-26 ENCOUNTER — Ambulatory Visit: Payer: BLUE CROSS/BLUE SHIELD | Admitting: Adult Health Nurse Practitioner

## 2018-05-26 VITALS — BP 150/90 | HR 94 | Temp 97.7°F | Ht 62.0 in | Wt 207.0 lb

## 2018-05-26 DIAGNOSIS — J301 Allergic rhinitis due to pollen: Secondary | ICD-10-CM

## 2018-05-26 DIAGNOSIS — R030 Elevated blood-pressure reading, without diagnosis of hypertension: Secondary | ICD-10-CM | POA: Diagnosis not present

## 2018-05-26 DIAGNOSIS — H6501 Acute serous otitis media, right ear: Secondary | ICD-10-CM | POA: Diagnosis not present

## 2018-05-26 MED ORDER — AMOXICILLIN-POT CLAVULANATE 875-125 MG PO TABS: 1.0000 | ORAL_TABLET | Freq: Two times a day (BID) | ORAL | 0 refills | Status: DC

## 2018-05-26 NOTE — Patient Instructions (Addendum)
Today you are being treated for:   Acute Otitis Media with Effusion  We are going to dry up the fluid with the zyrtec and treat the right ear with Augmentin   Please take these medications:     Allergy Symptoms / Runny Nose:  Zyrtec / Cetirizine  Continue taking this daily Take 10mg  by mouth May cause drowsiness, take nightly Be sure to drink plenty of water If this is not effective, try Xyzal or Allegra  OR  Xyzal / Levocetirazine  Take 5mg  by mouth May cause drowsiness, take nightly Be sure to drink plenty of water If this is not effective try Allegra or Zyrtec  OR  Allegra / fexofenadine Take 180mg  by mouth daily If this is not effective try Zyrtec or Xyzal    Sinus Congestion:  Norel AD or Tylenol Cold and Sinus Take one tablet by mouth every 4-6 hours as needed This will help to dry out your sinuses, nasal decongestant and acetaminophen.  Flonase:  Consider adding for nasal congestion IF needed. One spray in each nostril daily  This will help to open your nasal passages    [facility] Sinus Rinse / Neti Pot  For nasal congestion IF needed Use warm bottled or distilled water DO NOT use tap water! Use twice a day as needed This will help to sooth irritated sinuses and clear nasal congestion If using nasal sprays, do so after completing this.  Fevers:  Tylenol Extra Strength 500mg  Take 1-2 tablets every 8 hours as needed for fever or pain Do not take more than 4,000mg  to tylenol in 24 hours  May rotate between Tylenol and ibuprofen.  They are different.   Ibuprofen / Advil / Motrin - Will also help with inflammation  600mg  every six hours OR 800mg  every 8 hours If you have this at home each tablet is 200mg .   Antibiotic: Take as directed on your prescription    General Care:  Drink plenty of clear fluids  Get plenty of rest  Wash hands frequently and sanitize shared surfaces, kitchens, bathrooms.  Call or return with new or worsening  symptoms

## 2018-06-16 DIAGNOSIS — Z130 Encounter for screening for diseases of the blood and blood-forming organs and certain disorders involving the immune mechanism: Secondary | ICD-10-CM | POA: Diagnosis not present

## 2018-06-16 DIAGNOSIS — Z780 Asymptomatic menopausal state: Secondary | ICD-10-CM | POA: Diagnosis not present

## 2018-06-16 DIAGNOSIS — Z01419 Encounter for gynecological examination (general) (routine) without abnormal findings: Secondary | ICD-10-CM | POA: Diagnosis not present

## 2018-06-16 DIAGNOSIS — N951 Menopausal and female climacteric states: Secondary | ICD-10-CM | POA: Diagnosis not present

## 2018-06-16 DIAGNOSIS — Z6837 Body mass index (BMI) 37.0-37.9, adult: Secondary | ICD-10-CM | POA: Diagnosis not present

## 2018-06-16 DIAGNOSIS — Z1231 Encounter for screening mammogram for malignant neoplasm of breast: Secondary | ICD-10-CM | POA: Diagnosis not present

## 2018-08-03 ENCOUNTER — Encounter: Payer: Self-pay | Admitting: Adult Health

## 2018-08-03 NOTE — Progress Notes (Deleted)
Patient ID: Natalie Baker, female   DOB: 1955-07-12, 63 y.o.   MRN: [id number]  Complete Physical  Assessment and Plan:   Encounter for general adult medical examination with abnormal findings  Other specified hypothyroidism Hypothyroidism-check TSH level, continue medications the same, reminded to take on an empty stomach 30-83mins before food.  - TSH  Palpitations -currently well controlled, continue to avoid triggers - EKG 12-Lead  Hyperlipidemia -continue medications, check lipids, decrease fatty foods, increase activity.  - Lipid panel  Vitamin D deficiency -cont supplement - Vit D  25 hydroxy (rtn osteoporosis monitoring)  Osteoporosis -Vit D and calcium recommended - last DEXA 2016, in osteopenia range, get DEXA  Morbid Obesity -diet and exercise discussed    Medication management - CBC with Differential/Platelet - CMP/GFR - Magnesium  Prediabetes - Hemoglobin A1c - Insulin, random  Elevated BP - Urinalysis, Routine w reflex microscopic (not at [facility]) - Microalbumin / creatinine urine ratio - EKG 12-Lead  Anemia, unspecified type -     Iron and TIBC -     Vitamin B12   Discussed med's effects and SE's. Screening labs and tests as requested with regular follow-up as recommended. Future Appointments  Date Time Provider [place]  08/05/2018  3:00 PM Liane Comber, NP [facility] None    HPI  63 y.o. female  presents for a complete physical. She has Palpitations; Hyperlipidemia; Vitamin D deficiency; Hypothyroidism; Morbid obesity ([place]); Osteoporosis; Prediabetes; Elevated BP without diagnosis of hypertension; Shoulder pain; and CKD (chronic kidney disease) stage 3, GFR 30-59 ml/min (HCC) on their problem list.  She has osteoporosis and is on vivelle dot.   BMI is There is no height or weight on file to calculate BMI., she {HAS HAS [id number]} been working on diet and exercise. Wt Readings from Last 3 Encounters:  05/26/18 207 lb (93.9 kg)   03/20/18 203 lb (92.1 kg)  03/06/18 203 lb (92.1 kg)   Her blood pressure has been controlled at home, today their BP is  .  She does workout. She denies chest pain, shortness of breath, dizziness.   She is on cholesterol medication and denies myalgias. Her cholesterol is not at goal. The cholesterol last visit was:  Lab Results  Component Value Date   CHOL 218 (H) 02/02/2018   HDL 54 02/02/2018   LDLCALC 141 (H) 02/02/2018   TRIG 117 02/02/2018   CHOLHDL 4.0 02/02/2018  . She has been working on diet and exercise for prediabetes,  and denies foot ulcerations, hyperglycemia, hypoglycemia , increased appetite, nausea, paresthesia of the feet, polydipsia, polyuria, visual disturbances, vomiting and weight loss. Last A1C in the office was:  Lab Results  Component Value Date   HGBA1C 5.7 (H) 02/02/2018   Patient is on Vitamin D supplement.   Lab Results  Component Value Date   VD25OH 29 (L) 01/29/2017     She is on thyroid medication. Her medication was not changed last visit.   Lab Results  Component Value Date   TSH 2.52 02/02/2018  .    Current Medications:  Current Outpatient Medications on File Prior to Visit  Medication Sig Dispense Refill  . amoxicillin-clavulanate (AUGMENTIN) 875-125 MG tablet Take 1 tablet by mouth 2 (two) times daily. 10 days 20 tablet 0  . b complex vitamins tablet Take 1 tablet by mouth daily.    . Biotin 1000 MCG CHEW Chew 5,000 mcg by mouth daily.    . cholecalciferol (VITAMIN D3) 25 MCG (1000 UT) tablet Take 5,000 Units  by mouth daily.    . Coenzyme Q10 (COQ10 PO) Take 1 tablet by mouth 2 (two) times daily.    Marland Kitchen estradiol (VIVELLE-DOT) 0.05 MG/24HR patch Place 1 patch onto the skin 2 (two) times a week.    . Flaxseed, Linseed, (FLAX SEED OIL PO) Take 2 capsules by mouth 2 (two) times daily.    . metaxalone (SKELAXIN) 800 MG tablet Take 1 tablet (800 mg total) by mouth 3 (three) times daily. 30 tablet 1  . NATURE-THROID 65 MG tablet     . OVER  THE COUNTER MEDICATION daily. Tumeric    . progesterone (PROMETRIUM) 100 MG capsule      No current facility-administered medications on file prior to visit.     Health Maintenance:   Immunization History  Administered Date(s) Administered  . Influenza Split 11/06/2010  . Influenza, Seasonal, Injecte, Preservative Fre 01/29/2016  . Tdap 07/29/2016   Tetanus: 2018  Influenza 2018 Prevnar due 20 Pneumonia due 65 Shingles due 60 wants next OV ***  Pap 2013 goes to GYN, but has had hysterectomy MGM: 02/2016, Cat B, normal left breast US *** DEXA: 2014 DUE Colonoscopy: 03/2018 due 2020 [place] AB 2013  Last Dental Exam: Dr. Britta Mccreedy and Sarajane Jews Last Eye Exam: Dr. Delman Cheadle   Patient Care Team: [name], MD as PCP - General (Internal Medicine) Gatha Mayer, MD as Consulting Physician (Gastroenterology) Paula Compton, MD as Consulting Physician (Obstetrics and Gynecology) Druscilla Brownie, MD as Consulting Physician (Dermatology)  Medical History:  Past Medical History:  Diagnosis Date  . Allergy   . Colon polyp 12/29/2006  . Hyperlipidemia   . Hypothyroidism   . Low kidney function   . Obesity    BMI 34  . Osteoporosis   . Palpitations   . Vitamin D deficiency    Allergies Allergies  Allergen Reactions  . Ppd [Tuberculin Purified Protein Derivative]     Positive reaction.  Negative Chest Xray 06/16/12    SURGICAL HISTORY She  has a past surgical history that includes Abdominal hysterectomy (1997); Cesarean section (1983); Colonoscopy w/ biopsies (12/29/2006); laparoscopy; and Upper gastrointestinal endoscopy. FAMILY HISTORY Her family history includes Alcohol abuse in her father; Bone cancer in her maternal aunt; Breast cancer in her maternal aunt; Cancer (age of onset: 33) in her mother; Diabetes in her mother and paternal grandmother; Heart disease in her father and maternal aunt; Heart disease (age of onset: 71) in her mother; Hyperlipidemia in her brother  and maternal aunt; Hypertension in her brother, father, and mother; Liver disease in her maternal grandmother and mother; Thyroid disease in her mother. SOCIAL HISTORY She  reports that she quit smoking about 22 years ago. Her smoking use included cigarettes. She quit after 15.00 years of use. She has never used smokeless tobacco. She reports that she does not drink alcohol or use drugs.   Review of Systems: Review of Systems  Constitutional: Negative for chills, fever, malaise/fatigue and weight loss.  HENT: Negative for congestion, ear pain and sore throat.   Eyes: Negative.   Respiratory: Negative for cough, shortness of breath and wheezing.   Cardiovascular: Negative for chest pain, palpitations and leg swelling.  Gastrointestinal: Negative for blood in stool, constipation, diarrhea, heartburn, melena, nausea and vomiting.  Genitourinary: Negative.   Musculoskeletal: Positive for myalgias. Negative for joint pain and neck pain.  Skin: Negative for itching.  Neurological: Negative for dizziness, tingling, sensory change, loss of consciousness and headaches.  Psychiatric/Behavioral: Negative for depression. The patient is not  nervous/anxious and does not have insomnia.     Physical Exam: Estimated body mass index is 37.86 kg/m as calculated from the following:   Height as of 05/26/18: 5\' 2"  (1.575 m).   Weight as of 05/26/18: 207 lb (93.9 kg). There were no vitals taken for this visit.  General Appearance: Well nourished well developed, in no apparent distress.  Eyes: PERRLA, EOMs, conjunctiva no swelling or erythema ENT/Mouth: Ear canals normal without obstruction, swelling, erythema, or discharge.  TMs normal bilaterally with no erythema, bulging, retraction, or loss of landmark.  Oropharynx moist and clear with no exudate, erythema, or swelling.   Neck: Supple, thyroid normal. No bruits.  No cervical adenopathy Respiratory: Respiratory effort normal, Breath sounds clear A&P without  wheeze, rhonchi, rales.   Cardio: RRR without murmurs, rubs or gallops, occ extra beat. Brisk peripheral pulses without edema.  Chest: symmetric, with normal excursions Breasts: Symmetric, without lumps, nipple discharge, retractions.  Abdomen: Morbidly obese, Soft, nontender, no guarding, rebound, hernias, masses, or organomegaly.  Lymphatics: Non tender without lymphadenopathy.  Musculoskeletal: Full ROM all peripheral extremities,5/5 strength, and normal gait.  Skin: Warm, dry without rashes, lesions, ecchymosis.Right ankle with tenderness over lateral superior malleolus, some swelling no erythema, no ecchymosis, no effusion and full range of motion. Neurological Exam: normal Vascular Exam: normal, has very high arch  Neuro: Awake and oriented X 3, Cranial nerves intact, reflexes equal bilaterally. Normal muscle tone, no cerebellar symptoms. Sensation intact.  Psych:  normal affect, Insight and Judgment appropriate.   EKG: WNL, PRWP no ST changes. AORTA SCAN: defer  Over 40 minutes of exam, counseling, chart review and critical decision making was performed  Izora Ribas 1:48 PM [facility]

## 2018-08-03 NOTE — Progress Notes (Deleted)
Assessment and Plan:  Natalie Baker was seen today for acute visit.  Diagnoses and all orders for this visit:  Hypothyroidism, unspecified type continue medications the same pending lab results reminded to take on an empty stomach 30-25mins before food.  check TSH level -     TSH  Prediabetes Discussed disease and risks Discussed diet/exercise, weight management  A1C -     Hemoglobin A1c  Elevated BP without diagnosis of hypertension Not on meds, typically well controlled - ? R/t URI Monitor blood pressure at home; call if consistently over 130/80 Continue DASH diet.   Reminder to go to the ER if any CP, SOB, nausea, dizziness, severe HA, changes vision/speech, left arm numbness and tingling and jaw pain.  Morbid obesity ([place]) Long discussion about weight loss, diet, and exercise Recommended diet heavy in fruits and veggies and low in animal meats, cheeses, and dairy products, appropriate calorie intake Discussed appropriate weight for height and initial goal (195lb) Follow up at next visit  Hyperlipidemia, unspecified hyperlipidemia type Not currently on medications, discussed LDL goal <100, *** Continue low cholesterol diet and exercise.  Check lipid panel.  -     Lipid panel -     TSH   Vitamin D deficiency Defer vitamin D check today; restart on daily supplement - 2000 IU daily Check at CPE ***  Medication management -     CBC with Differential/Platelet -     COMPLETE METABOLIC PANEL WITH GFR  Further disposition pending results of labs. Discussed med's effects and SE's.   Over 30 minutes of exam, counseling, chart review, and critical decision making was performed.   Future Appointments  Date Time Provider [place]  08/05/2018  3:00 PM Liane Comber, NP [facility] None    ------------------------------------------------------------------------------------------------------------------   HPI There were no vitals taken for this visit.  63 y.o.female  presents for 3 month follow up on BP, cholesterol, prediabetes, obesity, vitamin D deficiency.   Overdue CPE ***   BMI is There is no height or weight on file to calculate BMI., she has not been working on diet and exercise. Wt Readings from Last 3 Encounters:  05/26/18 207 lb (93.9 kg)  03/20/18 203 lb (92.1 kg)  03/06/18 203 lb (92.1 kg)   She has not been checking blood pressure but has cuff if needed, today their BP is    She does not workout. She denies chest pain, shortness of breath, dizziness.   She is not on cholesterol medication (on RYRS and CoQ10 for several years) and denies myalgias. Her cholesterol is not at goal. The cholesterol last visit was:   Lab Results  Component Value Date   CHOL 218 (H) 02/02/2018   HDL 54 02/02/2018   LDLCALC 141 (H) 02/02/2018   TRIG 117 02/02/2018   CHOLHDL 4.0 02/02/2018    She has not been working on diet and exercise for prediabetes, and denies increased appetite, nausea, paresthesia of the feet, polydipsia, polyuria, visual disturbances and vomiting. Last A1C in the office was:  Lab Results  Component Value Date   HGBA1C 5.7 (H) 02/02/2018   She is on thyroid medication. Her medication was not changed last visit.   Lab Results  Component Value Date   TSH 2.52 02/02/2018   Patient is not currently on Vitamin D supplement.  *** Lab Results  Component Value Date   VD25OH 29 (L) 01/29/2017      Was in stage III CKD, followed by nephrology ***, recently improved  Lab Results  Component Value Date   GFRNONAA 74 02/02/2018       Past Medical History:  Diagnosis Date  . Allergy   . Colon polyp 12/29/2006  . Hyperlipidemia   . Hypothyroidism   . Low kidney function   . Obesity    BMI 34  . Osteoporosis   . Palpitations   . Vitamin D deficiency      Allergies  Allergen Reactions  . Ppd [Tuberculin Purified Protein Derivative]     Positive reaction.  Negative Chest Xray 06/16/12    Current Outpatient Medications on  File Prior to Visit  Medication Sig  . amoxicillin-clavulanate (AUGMENTIN) 875-125 MG tablet Take 1 tablet by mouth 2 (two) times daily. 10 days  . b complex vitamins tablet Take 1 tablet by mouth daily.  . Biotin 1000 MCG CHEW Chew 5,000 mcg by mouth daily.  . cholecalciferol (VITAMIN D3) 25 MCG (1000 UT) tablet Take 5,000 Units by mouth daily.  . Coenzyme Q10 (COQ10 PO) Take 1 tablet by mouth 2 (two) times daily.  Marland Kitchen estradiol (VIVELLE-DOT) 0.05 MG/24HR patch Place 1 patch onto the skin 2 (two) times a week.  . Flaxseed, Linseed, (FLAX SEED OIL PO) Take 2 capsules by mouth 2 (two) times daily.  . metaxalone (SKELAXIN) 800 MG tablet Take 1 tablet (800 mg total) by mouth 3 (three) times daily.  Marland Kitchen NATURE-THROID 65 MG tablet   . OVER THE COUNTER MEDICATION daily. Tumeric  . progesterone (PROMETRIUM) 100 MG capsule    No current facility-administered medications on file prior to visit.     ROS: Review of Systems  Constitutional: Negative for malaise/fatigue and weight loss.  HENT: Negative for hearing loss and tinnitus.   Eyes: Negative for blurred vision and double vision.  Respiratory: Negative for cough, shortness of breath and wheezing.   Cardiovascular: Negative for chest pain, palpitations, orthopnea, claudication and leg swelling.  Gastrointestinal: Negative for abdominal pain, blood in stool, constipation, diarrhea, heartburn, melena, nausea and vomiting.  Genitourinary: Negative.   Musculoskeletal: Negative for joint pain and myalgias.  Skin: Negative for rash.  Neurological: Negative for dizziness, tingling, sensory change, weakness and headaches.  Endo/Heme/Allergies: Negative for polydipsia.  Psychiatric/Behavioral: Negative.   All other systems reviewed and are negative.    Physical Exam:  There were no vitals taken for this visit.  General Appearance: Well nourished, in no apparent distress, appears flushed Eyes: PERRLA, EOMs, conjunctiva no swelling or  erythema Sinuses: No Frontal/maxillary tenderness ENT/Mouth: Ext aud canals clear, TMs without erythema, bulging. No erythema, swelling, or exudate on post pharynx.  Tonsils not swollen or erythematous. Hearing normal.  Neck: Supple, thyroid normal.  Respiratory: Respiratory effort normal, BS equal bilaterally without rales, rhonchi, wheezing or stridor.  Cardio: RRR with no MRGs. Brisk peripheral pulses without edema.  Abdomen: Soft, + BS.  Non tender, no guarding, rebound, hernias, masses. Lymphatics: Non tender without lymphadenopathy.  Musculoskeletal: Full ROM, 5/5 strength, normal gait.  Skin: Warm, dry without rashes, lesions, ecchymosis.  Neuro: Cranial nerves intact. Normal muscle tone, no cerebellar symptoms. Sensation intact.  Psych: Awake and oriented X 3, normal affect, Insight and Judgment appropriate.     Izora Ribas, NP 1:42 PM [facility]

## 2018-08-05 ENCOUNTER — Ambulatory Visit: Payer: Self-pay | Admitting: Adult Health

## 2018-08-06 DIAGNOSIS — D179 Benign lipomatous neoplasm, unspecified: Secondary | ICD-10-CM | POA: Diagnosis not present

## 2018-08-06 DIAGNOSIS — M19071 Primary osteoarthritis, right ankle and foot: Secondary | ICD-10-CM | POA: Diagnosis not present

## 2018-08-06 DIAGNOSIS — M67471 Ganglion, right ankle and foot: Secondary | ICD-10-CM | POA: Diagnosis not present

## 2018-08-06 DIAGNOSIS — M76821 Posterior tibial tendinitis, right leg: Secondary | ICD-10-CM | POA: Diagnosis not present

## 2018-08-06 DIAGNOSIS — M67371 Transient synovitis, right ankle and foot: Secondary | ICD-10-CM | POA: Diagnosis not present

## 2018-08-12 NOTE — Progress Notes (Signed)
Patient ID: Natalie Baker, female   DOB: 1955/09/29, 63 y.o.   MRN: [id number]  Complete Physical  Assessment and Plan:   Encounter for general adult medical examination with abnormal findings  Other specified hypothyroidism Hypothyroidism-check TSH level, continue medications the same, reminded to take on an empty stomach 30-8mins before food.  - TSH  Palpitations -currently well controlled, continue to avoid triggers - EKG 12-Lead  Hyperlipidemia -continue supplements, check lipids, decrease fatty foods, increase activity.  - if LDL remains above 130 initiate statin discussed  - Lipid panel   Vitamin D deficiency - defer check as never changed dose, she will increase to 10000 IU daily, check next OV  Osteoporosis -Vit D and calcium recommended, vivielle dot - last DEXA 2018-2019, getting DEXA via GYN  Morbid Obesity -diet and exercise discussed    Medication management - CBC with Differential/Platelet - CMP/GFR - Magnesium  Prediabetes/family history diabetes - Defer Hemoglobin A1c as had checked this year and insurance will only cover once - Insulin, random - weight loss discussed, goal for <175 lb set for next visit  Elevated BP - Urinalysis, Routine w reflex microscopic (not at [facility]) - Microalbumin / creatinine urine ratio - EKG 12-Lead   Discussed med's effects and SE's. Screening labs and tests as requested with regular follow-up as recommended. Future Appointments  Date Time Provider [place]  08/18/2019  3:00 PM Liane Comber, NP [facility] None    HPI  63 y.o. female  presents for a complete physical. She has Hyperlipidemia; Vitamin D deficiency; Hypothyroidism; Morbid obesity ([place]); Osteoporosis; Prediabetes; Elevated BP without diagnosis of hypertension; and CKD (chronic kidney disease) stage 3, GFR 30-59 ml/min (HCC) on their problem list.  She is married, 1 child, no grandchildren. 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   She is  followed by GYN; hx of TAH for endometriosis; She has osteoporosis and is on vivelle dot.   She was seen by podiatry Dr. Gershon Mussel for R ankle pain, has been advised bone spur and cyst, got injection has been placed in brace and she reports doing much better.   BMI is Body mass index is 37.68 kg/m., she has been working on diet, cut out red meat, pushing fruits and veggies and admits minimal exercise due to heat and ankle pain. Wt Readings from Last 3 Encounters:  08/13/18 206 lb (93.4 kg)  05/26/18 207 lb (93.9 kg)  03/20/18 203 lb (92.1 kg)   She has remote history of palpations, but recent issues.  Her blood pressure has been controlled at home, today their BP is BP: 138/90.  She does workout. She denies chest pain, shortness of breath, dizziness.   She is not on cholesterol medication and denies myalgias. Her cholesterol is not at goal. The cholesterol last visit was:  Lab Results  Component Value Date   CHOL 218 (H) 02/02/2018   HDL 54 02/02/2018   LDLCALC 141 (H) 02/02/2018   TRIG 117 02/02/2018   CHOLHDL 4.0 02/02/2018  . She has been working on diet and exercise for prediabetes,  and denies foot ulcerations, hyperglycemia, hypoglycemia , increased appetite, nausea, paresthesia of the feet, polydipsia, polyuria, visual disturbances, vomiting and weight loss. Last A1C in the office was:  Lab Results  Component Value Date   HGBA1C 5.7 (H) 02/02/2018   Lab Results  Component Value Date   GFRNONAA 74 02/02/2018   Patient is on Vitamin D supplement, 5000 IU daily, hasn't changed since last, agreeable to increasing dose Lab Results  Component Value Date   VD25OH 29 (L) 01/29/2017     She is on thyroid medication. Her medication was not changed last visit.   Lab Results  Component Value Date   TSH 2.52 02/02/2018  .    Current Medications:  Current Outpatient Medications on File Prior to Visit  Medication Sig Dispense Refill  . b complex vitamins tablet Take 1 tablet by mouth  daily.    . Biotin 1000 MCG CHEW Chew 5,000 mcg by mouth daily.    . cholecalciferol (VITAMIN D3) 25 MCG (1000 UT) tablet Take 5,000 Units by mouth daily.    . Coenzyme Q10 (COQ10 PO) Take 1 tablet by mouth 2 (two) times daily.    Marland Kitchen estradiol (VIVELLE-DOT) 0.05 MG/24HR patch Place 1 patch onto the skin 2 (two) times a week.    . Flaxseed, Linseed, (FLAX SEED OIL PO) Take 2 capsules by mouth 2 (two) times daily.    . meloxicam (MOBIC) 7.5 MG tablet Take 7.5 mg by mouth daily.    . metaxalone (SKELAXIN) 800 MG tablet Take 1 tablet (800 mg total) by mouth 3 (three) times daily. 30 tablet 1  . NATURE-THROID 65 MG tablet     . OVER THE COUNTER MEDICATION daily. Tumeric    . progesterone (PROMETRIUM) 100 MG capsule     . amoxicillin-clavulanate (AUGMENTIN) 875-125 MG tablet Take 1 tablet by mouth 2 (two) times daily. 10 days 20 tablet 0   No current facility-administered medications on file prior to visit.     Health Maintenance:   Immunization History  Administered Date(s) Administered  . Influenza Split 11/06/2010  . Influenza, Seasonal, Injecte, Preservative Fre 01/29/2016  . Tdap 07/29/2016   Tetanus: 2018  Influenza 2018 Prevnar due 5 Pneumonia due 31 Shingles due 35 -   Pap 2020 goes to GYN, pelvic, but has had hysterectomy MGM: 6-07/2018, at GYN DEXA: 2014, had 2018-2019, GYN monitoring Colonoscopy: 03/2018 due 2030 [place] AB 2013  Last Dental Exam: Dr. Laurena Bering, 2020 Last Eye Exam: Dr. Delman Cheadle, 2020 Last derm: Dr. Allyson Sabal, last 2018  Patient Care Team: [name], MD as PCP - General (Internal Medicine) Gatha Mayer, MD as Consulting Physician (Gastroenterology) Paula Compton, MD as Consulting Physician (Obstetrics and Gynecology) Druscilla Brownie, MD as Consulting Physician (Dermatology)  Medical History:  Past Medical History:  Diagnosis Date  . Allergy   . Colon polyp 12/29/2006  . 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 Hyperlipidemia   . Hypothyroidism   . Low  kidney function   . Obesity    BMI 34  . Osteoporosis   . Palpitations   . Vitamin D deficiency    Allergies Allergies  Allergen Reactions  . Ppd [Tuberculin Purified Protein Derivative]     Positive reaction.  Negative Chest Xray 06/16/12    SURGICAL HISTORY She  has a past surgical history that includes Abdominal hysterectomy (1997); Cesarean section (1983); Colonoscopy w/ biopsies (12/29/2006); laparoscopy; and Upper gastrointestinal endoscopy. FAMILY HISTORY Her family history includes Alcohol abuse in her father; Bone cancer in her maternal aunt; Breast cancer in her maternal aunt; Breast cancer (age of onset: 51) in her niece; Breast cancer (age of onset: 47) in her sister; Cancer (age of onset: 61) in her mother; Diabetes in her brother, mother, paternal grandmother, and sister; Hashimoto's thyroiditis in her sister; Heart disease in her father and maternal aunt; Heart disease (age of onset: 79) in her mother; Hyperlipidemia in her brother and maternal aunt; Hypertension in her brother, father,  and mother; Hypothyroidism in her brother and sister; Liver disease in her maternal grandmother and mother; Thyroid disease in her mother. SOCIAL HISTORY She  reports that she quit smoking about 22 years ago. Her smoking use included cigarettes. She quit after 15.00 years of use. She has never used smokeless tobacco. She reports that she does not drink alcohol or use drugs.   Review of Systems: Review of Systems  Constitutional: Negative for chills, fever, malaise/fatigue and weight loss.  HENT: Negative for congestion, ear pain and sore throat.   Eyes: Negative.   Respiratory: Negative for cough, shortness of breath and wheezing.   Cardiovascular: Negative for chest pain, palpitations and leg swelling.  Gastrointestinal: Negative for blood in stool, constipation, diarrhea, heartburn, melena, nausea and vomiting.  Genitourinary: Negative.   Musculoskeletal: Negative for joint pain,  myalgias and neck pain.  Skin: Negative for itching.  Neurological: Negative for dizziness, tingling, sensory change, loss of consciousness and headaches.  Psychiatric/Behavioral: Negative for depression. The patient is not nervous/anxious and does not have insomnia.     Physical Exam: Estimated body mass index is 37.68 kg/m as calculated from the following:   Height as of this encounter: 5\' 2"  (1.575 m).   Weight as of this encounter: 206 lb (93.4 kg). BP 138/90   Pulse (!) 110   Temp (!) 96.4 F (35.8 C)   Ht 5\' 2"  (1.575 m)   Wt 206 lb (93.4 kg)   SpO2 98%   BMI 37.68 kg/m   General Appearance: Well nourished well developed, in no apparent distress.  Eyes: PERRLA, EOMs, conjunctiva no swelling or erythema ENT/Mouth: Ear canals normal without obstruction, swelling, erythema, or discharge.  TMs normal bilaterally with no erythema, bulging, retraction, or loss of landmark.  Oropharynx moist and clear with no exudate, erythema, or swelling.   Neck: Supple, thyroid normal. No bruits.  No cervical adenopathy Respiratory: Respiratory effort normal, Breath sounds clear A&P without wheeze, rhonchi, rales.   Cardio: RRR without murmurs, rubs or gallops, occ extra beat. Brisk peripheral pulses without edema.  Chest: symmetric, with normal excursions Breasts: Symmetric, without lumps, nipple discharge, retractions.  Abdomen: Morbidly obese, Soft, nontender, no guarding, rebound, hernias, masses, or organomegaly.  Lymphatics: Non tender without lymphadenopathy.  Musculoskeletal: Full ROM all peripheral extremities,5/5 strength, and normal gait.  Skin: Warm, dry without rashes, lesions, ecchymosis.Right ankle with tenderness over lateral superior malleolus, some swelling no erythema, no ecchymosis, no effusion and full range of motion. Neurological Exam: normal Vascular Exam: normal, has very high arch  Neuro: Awake and oriented X 3, Cranial nerves intact, reflexes equal bilaterally. Normal  muscle tone, no cerebellar symptoms. Sensation intact.  Psych:  normal affect, Insight and Judgment appropriate.   EKG: WNL, PAC, PRWP no ST changes. AORTA SCAN: defer  Over 40 minutes of exam, counseling, chart review and critical decision making was performed  Izora Ribas 3:27 PM [facility]

## 2018-08-13 ENCOUNTER — Encounter: Payer: Self-pay | Admitting: Adult Health

## 2018-08-13 ENCOUNTER — Ambulatory Visit (INDEPENDENT_AMBULATORY_CARE_PROVIDER_SITE_OTHER): Payer: BC Managed Care – PPO | Admitting: Adult Health

## 2018-08-13 ENCOUNTER — Other Ambulatory Visit: Payer: Self-pay

## 2018-08-13 VITALS — BP 132/84 | HR 110 | Temp 96.4°F | Ht 62.0 in | Wt 206.0 lb

## 2018-08-13 DIAGNOSIS — N183 Chronic kidney disease, stage 3 unspecified: Secondary | ICD-10-CM

## 2018-08-13 DIAGNOSIS — Z833 Family history of diabetes mellitus: Secondary | ICD-10-CM

## 2018-08-13 DIAGNOSIS — Z131 Encounter for screening for diabetes mellitus: Secondary | ICD-10-CM

## 2018-08-13 DIAGNOSIS — Z1389 Encounter for screening for other disorder: Secondary | ICD-10-CM

## 2018-08-13 DIAGNOSIS — Z0000 Encounter for general adult medical examination without abnormal findings: Secondary | ICD-10-CM

## 2018-08-13 DIAGNOSIS — Z136 Encounter for screening for cardiovascular disorders: Secondary | ICD-10-CM | POA: Diagnosis not present

## 2018-08-13 DIAGNOSIS — Z0001 Encounter for general adult medical examination with abnormal findings: Secondary | ICD-10-CM

## 2018-08-13 DIAGNOSIS — Z79899 Other long term (current) drug therapy: Secondary | ICD-10-CM

## 2018-08-13 DIAGNOSIS — Z13220 Encounter for screening for lipoid disorders: Secondary | ICD-10-CM | POA: Diagnosis not present

## 2018-08-13 DIAGNOSIS — R002 Palpitations: Secondary | ICD-10-CM

## 2018-08-13 DIAGNOSIS — R7303 Prediabetes: Secondary | ICD-10-CM

## 2018-08-13 DIAGNOSIS — Z8249 Family history of ischemic heart disease and other diseases of the circulatory system: Secondary | ICD-10-CM

## 2018-08-13 DIAGNOSIS — M810 Age-related osteoporosis without current pathological fracture: Secondary | ICD-10-CM

## 2018-08-13 DIAGNOSIS — E039 Hypothyroidism, unspecified: Secondary | ICD-10-CM

## 2018-08-13 DIAGNOSIS — R030 Elevated blood-pressure reading, without diagnosis of hypertension: Secondary | ICD-10-CM

## 2018-08-13 DIAGNOSIS — E785 Hyperlipidemia, unspecified: Secondary | ICD-10-CM

## 2018-08-13 DIAGNOSIS — Z1329 Encounter for screening for other suspected endocrine disorder: Secondary | ICD-10-CM | POA: Diagnosis not present

## 2018-08-13 DIAGNOSIS — E559 Vitamin D deficiency, unspecified: Secondary | ICD-10-CM

## 2018-08-13 NOTE — Patient Instructions (Addendum)
Natalie Baker , Thank you for taking time to come for your Annual Wellness Visit. I appreciate your ongoing commitment to your health goals. Please review the following plan we discussed and let me know if I can assist you in the future.   These are the goals we discussed: Goals    . Blood Pressure < 130/80    . DIET - INCREASE WATER INTAKE     65-80+ fluid ounces daily     . Exercise 150 min/wk Moderate Activity    . LDL CALC < 100    . Weight (lb) < 175 lb (79.4 kg) (pt-stated)       This is a list of the screening recommended for you and due dates:  Health Maintenance  Topic Date Due  . Mammogram  01/16/2017  . Flu Shot  08/08/2018  . Tetanus Vaccine  07/30/2026  . Colon Cancer Screening  03/19/2028  .  Hepatitis C: One time screening is recommended by Center for Disease Control  (CDC) for  adults born from [birth year] through 1965.   Completed  . HIV Screening  Completed     Ask insurance about singles vaccine coverage - zostavax vs shingrix     Know what a healthy weight is for you (roughly BMI <25) and aim to maintain this  Aim for 7+ servings of fruits and vegetables daily  65-80+ fluid ounces of water or unsweet tea for healthy kidneys  Limit to max 1 drink of alcohol per day; avoid smoking/tobacco  Limit animal fats in diet for cholesterol and heart health - choose grass fed whenever available  Avoid highly processed foods, and foods high in saturated/trans fats  Aim for low stress - take time to unwind and care for your mental health  Aim for 150 min of moderate intensity exercise weekly for heart health, and weights twice weekly for bone health  Aim for 7-9 hours of sleep daily  General weight loss tips   Drink 1/2 your body weight in fluid ounces of water daily; drink a tall glass of water 30 min before meals  Don't eat until you're stuffed- listen to your stomach and eat until you are 80% full   Try eating off of a salad plate; wait 10 min after  finishing before going back for seconds  Start by eating the vegetables on your plate; aim for 50% of your meals to be fruits or vegetables  Then eat your protein - lean meats (grass fed if possible), fish, beans, nuts in moderation  Eat your carbs/starch last ONLY if you still are hungry. If you can, stop before finishing it all  Avoid sugar and flour - the closer it looks to it's original form in nature, typically the better it is for you  Splurge in moderation - "assign" days when you get to splurge and have the "bad stuff" - I like to follow a 80% - 20% plan- "good" choices 80 % of the time, "bad" choices in moderation 20% of the time  Simple equation is: Calories out > calories in = weight loss - even if you eat the bad stuff, if you limit portions, you will still lose weight    8 Critical Weight-Loss Tips That Aren't Diet and Exercise  1. STARVE THE DISTRACTIONS  All too often when we eat, we're also multitasking: watching TV, answering emails, scrolling through social media. These habits are detrimental to having a strong, clear, healthy relationship with food, and they can hinder our  ability to make dietary changes.  In order to truly focus on what you're eating, how much you're eating, why you're eating those specific foods and, most importantly, how those foods make you feel, you need to starve the distractions. That means when you eat, just eat. Focus on your food, the process it went through to end up on your plate, where it came from and how it nourishes you. With this technique, you're more likely to finish a meal feeling satiated.  2.  CONSIDER WHAT YOU'RE NOT WILLING TO DO  This might sound counterintuitive, but it can help provide a "why" when motivation is waning. Declare, in writing, what you are unwilling to do, for example "I am unwilling to be the old dad who cannot play sports with my children".  So consider what you're not willing to accept, write it down, and keep  it at the ready.  3.  STOP LABELING FOOD "GOOD" AND "BAD"  You've probably heard someone say they ate something "bad." Maybe you've even said it yourself.  The trouble with 'bad' foods isn't that they'll send you to the grave after a bite or two. The trouble comes when we eat excessive portions of really calorie-dense foods meal after meal, day after day.  Instead of labeling foods as good or bad, think about which foods you can eat a lot of, and which ones you should just eat a little of. Then, plan ways to eat the foods you really like in portions that fit with your overall goals. A good example of this would be having a slice of pizza alongside a club salad with chicken breast, avocado and a bit of dressing. This is vastly different than 3 slices of pizza, 4 breadsticks with cheese sauce and half of a liter of regular soda.  4.  BRUSH YOUR TEETH AFTER YOU EAT  Getting your mindset in order is important, but sometimes small habits can make a big difference. After eating, you still have the taste of food in their mouth, which often causes people to eat more even if they are full or engage in a nibble or two of dessert.  Brushing your teeth will remove the taste of food from your mouth, and the clean, minty freshness will serve as a cue that mealtime is over.  5.  FOCUS ON CROWDING NOT CUTTING  The most common first step during 'dieting' is to cut. We cut our portion sizes down, we cut out 'bad' foods, we cut out entire food groups. This act of cutting puts [place] and our minds into scarcity mode.  When something is off-limits, even if you're able to avoid it for a while, you could end up bingeing on it later because you've gone so long without it. So, instead of cutting, focus on crowding. If you crowd your plate and fill it up with more foods like veggies and protein, it simply allows less room for the other stuff. In other words, shift your focus away from what you can't eat, and celebrate the foods  that will help you reach your goals.  6.  TAKE TRACKING A STEP FURTHER  Track what you eat, when you ate it, how much you ate and how that food made you feel. Being completely honest with yourself and writing down every single thing that passes through your lips will help you start to notice that maybe you actually do snack, possibly take in more sugar than you thought, eat when you're bored rather than  just hungry or maybe that you have a habit of snacking before bed while watching TV.  The difference from simply tracking your food intake is you're taking into account how food makes you feel, as well as what you're doing while you're eating. This is about becoming more mindful of what, when and why you eat.  7.  PRIORITIZE GOOD SLEEP  One of the strongest risk factors for being overweight is poor sleep. When you're feeling tired, you're more likely to choose unhealthy comfort foods and to skip your workout. Additionally, sleep deprivation may slow down your metabolism. [employer]! Therefore, sleeping 7-8 hours per night can help with weight loss without having to change your diet or increase your physical activity. And if you feel you snore and still wake up tired, talk with me about sleep apnea.  8.  SET ASIDE TIME TO DISCONNECT  Just get out there. Disconnect from the electronics and connect to the elements. Not only will this help reduce stress (a major factor in weight gain) by giving your mind a break from the constant stimulation we've all become so accustomed to, but it may also reprogram your brain to connect with yourself and what you're feeling.

## 2018-08-14 LAB — CBC WITH DIFFERENTIAL/PLATELET
Absolute Monocytes: 735 cells/uL (ref 200–950)
Basophils Absolute: 47 cells/uL (ref 0–200)
Basophils Relative: 0.5 %
Eosinophils Absolute: 84 cells/uL (ref 15–500)
Eosinophils Relative: 0.9 %
HCT: 46.7 % — ABNORMAL HIGH (ref 35.0–45.0)
Hemoglobin: 15 g/dL (ref 11.7–15.5)
Lymphs Abs: 4036 cells/uL — ABNORMAL HIGH (ref 850–3900)
MCH: 26.6 pg — ABNORMAL LOW (ref 27.0–33.0)
MCHC: 32.1 g/dL (ref 32.0–36.0)
MCV: 82.9 fL (ref 80.0–100.0)
MPV: 10.5 fL (ref 7.5–12.5)
Monocytes Relative: 7.9 %
Neutro Abs: 4399 cells/uL (ref 1500–7800)
Neutrophils Relative %: 47.3 %
Platelets: 271 10*3/uL (ref 140–400)
RBC: 5.63 10*6/uL — ABNORMAL HIGH (ref 3.80–5.10)
RDW: 13.5 % (ref 11.0–15.0)
Total Lymphocyte: 43.4 %
WBC: 9.3 10*3/uL (ref 3.8–10.8)

## 2018-08-14 LAB — COMPLETE METABOLIC PANEL WITH GFR
AG Ratio: 1.6 (calc) (ref 1.0–2.5)
ALT: 22 U/L (ref 6–29)
AST: 20 U/L (ref 10–35)
Albumin: 4.7 g/dL (ref 3.6–5.1)
Alkaline phosphatase (APISO): 79 U/L (ref 37–153)
BUN: 17 mg/dL (ref 7–25)
CO2: 28 mmol/L (ref 20–32)
Calcium: 10 mg/dL (ref 8.6–10.4)
Chloride: 99 mmol/L (ref 98–110)
Creat: 0.81 mg/dL (ref 0.50–0.99)
GFR, Est African American: 90 mL/min/{1.73_m2} (ref >=60)
GFR, Est Non African American: 78 mL/min/{1.73_m2} (ref >=60)
Globulin: 2.9 g/dL (calc) (ref 1.9–3.7)
Glucose, Bld: 83 mg/dL (ref 65–99)
Potassium: 4.3 mmol/L (ref 3.5–5.3)
Sodium: 138 mmol/L (ref 135–146)
Total Bilirubin: 0.3 mg/dL (ref 0.2–1.2)
Total Protein: 7.6 g/dL (ref 6.1–8.1)

## 2018-08-14 LAB — URINALYSIS, ROUTINE W REFLEX MICROSCOPIC
Bilirubin Urine: NEGATIVE
Glucose, UA: NEGATIVE
Hgb urine dipstick: NEGATIVE
Ketones, ur: NEGATIVE
Leukocytes,Ua: NEGATIVE
Nitrite: NEGATIVE
Protein, ur: NEGATIVE
Specific Gravity, Urine: 1.007 (ref 1.001–1.03)
pH: 5.5 (ref 5.0–8.0)

## 2018-08-14 LAB — LIPID PANEL
Cholesterol: 223 mg/dL — ABNORMAL HIGH (ref <200)
HDL: 53 mg/dL (ref >=50)
LDL Cholesterol (Calc): 139 mg/dL (calc) — ABNORMAL HIGH
Non-HDL Cholesterol (Calc): 170 mg/dL (calc) — ABNORMAL HIGH (ref <130)
Total CHOL/HDL Ratio: 4.2 (calc) (ref <5.0)
Triglycerides: 173 mg/dL — ABNORMAL HIGH (ref <150)

## 2018-08-14 LAB — INSULIN, RANDOM: Insulin: 7 u[IU]/mL

## 2018-08-14 LAB — MAGNESIUM: Magnesium: 2.1 mg/dL (ref 1.5–2.5)

## 2018-08-14 LAB — MICROALBUMIN / CREATININE URINE RATIO
Creatinine, Urine: 27 mg/dL (ref 20–275)
Microalb Creat Ratio: 19 mcg/mg creat (ref <30)
Microalb, Ur: 0.5 mg/dL

## 2018-08-14 LAB — TSH: TSH: 2.2 mIU/L (ref 0.40–4.50)

## 2018-08-17 DIAGNOSIS — D179 Benign lipomatous neoplasm, unspecified: Secondary | ICD-10-CM | POA: Diagnosis not present

## 2018-08-17 DIAGNOSIS — M71571 Other bursitis, not elsewhere classified, right ankle and foot: Secondary | ICD-10-CM | POA: Diagnosis not present

## 2018-08-17 DIAGNOSIS — M67371 Transient synovitis, right ankle and foot: Secondary | ICD-10-CM | POA: Diagnosis not present

## 2018-08-19 ENCOUNTER — Telehealth: Payer: Self-pay

## 2018-08-19 ENCOUNTER — Other Ambulatory Visit: Payer: Self-pay | Admitting: Adult Health

## 2018-08-19 MED ORDER — ROSUVASTATIN CALCIUM 5 MG PO TABS: ORAL_TABLET | ORAL | 1 refills | Status: DC

## 2018-08-19 NOTE — Telephone Encounter (Signed)
Request for her new statin medication to be sent into the pharmacy. Please advise.

## 2018-11-18 NOTE — Progress Notes (Deleted)
Assessment and Plan:  Natalie Baker was seen today for acute visit.  Diagnoses and all orders for this visit:  Hypothyroidism, unspecified type continue medications the same pending lab results reminded to take on an empty stomach 30-52mins before food.  check TSH level -     TSH  Prediabetes Discussed disease and risks Discussed diet/exercise, weight management  A1C -     Hemoglobin A1c  Elevated BP without diagnosis of hypertension Not on meds, typically well controlled - ? R/t URI Monitor blood pressure at home; call if consistently over 130/80 Continue DASH diet.   Reminder to go to the ER if any CP, SOB, nausea, dizziness, severe HA, changes vision/speech, left arm numbness and tingling and jaw pain.  Morbid obesity ([place]) Long discussion about weight loss, diet, and exercise Recommended diet heavy in fruits and veggies and low in animal meats, cheeses, and dairy products, appropriate calorie intake Discussed appropriate weight for height and initial goal (195lb) Follow up at next visit  Hyperlipidemia, unspecified hyperlipidemia type Not currently on medications, discussed LDL goal <100, unless severely elevated today, she would prefer to defer starting meds and work on lifestyle, and recheck at [place] low cholesterol diet and exercise.  Check lipid panel.  -     Lipid panel -     TSH  Vitamin D deficiency Defer vitamin D check today; restart on daily supplement - 2000 IU daily Check at CPE  Medication management -     CBC with Differential/Platelet -     COMPLETE METABOLIC PANEL WITH GFR  Acute URI Nonspecific; will proceed with abx due to progressive symptoms Suggested symptomatic OTC remedies. Nasal saline spray for congestion. Nasal steroids, rotate allergy pill, oral steroids offered Follow up as needed. -     azithromycin (ZITHROMAX) 250 MG tablet; Take 2 tablets (500 mg) on  Day 1,  followed by 1 tablet (250 mg) once daily on Days 2 through 5. -      predniSONE (DELTASONE) 20 MG tablet; 2 tablets daily for 3 days, 1 tablet daily for 4 days. -     promethazine-dextromethorphan (PROMETHAZINE-DM) 6.25-15 MG/5ML syrup; Take 5 mLs by mouth 4 (four) times daily as needed for cough.   Further disposition pending results of labs. Discussed med's effects and SE's.   Over 30 minutes of exam, counseling, chart review, and critical decision making was performed.   Future Appointments  Date Time Provider Hall  11/19/2018  3:30 PM Liane Comber, NP [facility] None  02/16/2019  3:45 PM Liane Comber, NP [facility] None  08/18/2019  3:00 PM Liane Comber, NP [facility] None    ------------------------------------------------------------------------------------------------------------------   HPI There were no vitals taken for this visit.  63 y.o.female presents for 3 month follow up on htn, hypderlipidemia, hypothyroid, prediabetes, CKD II and vitamin D deficiency.   BMI is There is no height or weight on file to calculate BMI., she has not been working on diet and exercise. Wt Readings from Last 3 Encounters:  08/13/18 206 lb (93.4 kg)  05/26/18 207 lb (93.9 kg)  03/20/18 203 lb (92.1 kg)   She has not been checking blood pressure but has cuff if needed, today their BP is    She does not workout. She denies chest pain, shortness of breath, dizziness.   She is on cholesterol medication (on RYRS and CoQ10 for several years, recently initiated on rosuvastatin 5 mg and currently taking three days a week ***) and denies myalgias. Her cholesterol is not at  goal. The cholesterol last visit was:   Lab Results  Component Value Date   CHOL 223 (H) 08/13/2018   HDL 53 08/13/2018   LDLCALC 139 (H) 08/13/2018   TRIG 173 (H) 08/13/2018   CHOLHDL 4.2 08/13/2018    She has not been working on diet and exercise for prediabetes, and denies increased appetite, nausea, paresthesia of the feet, polydipsia, polyuria, visual disturbances and  vomiting. Last A1C in the office was:  Lab Results  Component Value Date   HGBA1C 5.7 (H) 02/02/2018    Renal functions are recently stable in CKD stage II:  Lab Results  Component Value Date   GFRNONAA 78 08/13/2018   She is on thyroid medication. Her medication was not changed last visit.   Lab Results  Component Value Date   TSH 2.20 08/13/2018   Patient is not currently on Vitamin D supplement. ***   Lab Results  Component Value Date   VD25OH 29 (L) 01/29/2017           Past Medical History:  Diagnosis Date  . Allergy   . Colon polyp 12/29/2006  . 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 Hyperlipidemia   . Hypothyroidism   . Low kidney function   . Obesity    BMI 34  . Osteoporosis   . Palpitations   . Vitamin D deficiency      Allergies  Allergen Reactions  . Ppd [Tuberculin Purified Protein Derivative]     Positive reaction.  Negative Chest Xray 06/16/12    Current Outpatient Medications on File Prior to Visit  Medication Sig  . b complex vitamins tablet Take 1 tablet by mouth daily.  . Biotin 1000 MCG CHEW Chew 5,000 mcg by mouth daily.  . cholecalciferol (VITAMIN D3) 25 MCG (1000 UT) tablet Take 10,000 Units by mouth daily.  . Coenzyme Q10 (COQ10 PO) Take 1 tablet by mouth 2 (two) times daily.  Marland Kitchen estradiol (VIVELLE-DOT) 0.05 MG/24HR patch Place 1 patch onto the skin 2 (two) times a week.  . Flaxseed, Linseed, (FLAX SEED OIL PO) Take 2 capsules by mouth 2 (two) times daily.  . meloxicam (MOBIC) 7.5 MG tablet Take 7.5 mg by mouth daily.  . metaxalone (SKELAXIN) 800 MG tablet Take 1 tablet (800 mg total) by mouth 3 (three) times daily.  Marland Kitchen NATURE-THROID 65 MG tablet   . OVER THE COUNTER MEDICATION daily. Tumeric  . progesterone (PROMETRIUM) 100 MG capsule   . rosuvastatin (CRESTOR) 5 MG tablet Start by taking 1 tab by mouth at night once weekly; if tolerating, slowly increase frequency up to taking three nights a week (MWF).   No current facility-administered  medications on file prior to visit.     ROS: Review of Systems  Constitutional: Negative for chills, diaphoresis, fever, malaise/fatigue and weight loss.  HENT: Negative for congestion, hearing loss, sinus pain, sore throat and tinnitus.   Eyes: Negative for blurred vision and double vision.  Respiratory: Negative for cough, hemoptysis, sputum production, shortness of breath and wheezing.   Cardiovascular: Negative for chest pain, palpitations, orthopnea, claudication and leg swelling.  Gastrointestinal: Negative for abdominal pain, blood in stool, constipation, diarrhea, heartburn, melena, nausea and vomiting.  Genitourinary: Negative.   Musculoskeletal: Negative for joint pain and myalgias.  Skin: Negative for rash.  Neurological: Negative for dizziness, tingling, sensory change, weakness and headaches.  Endo/Heme/Allergies: Negative for environmental allergies and polydipsia.  Psychiatric/Behavioral: Negative.   All other systems reviewed and are negative.    Physical Exam:  There  were no vitals taken for this visit.  General Appearance: Well nourished, in no apparent distress, appears flushed Eyes: PERRLA, EOMs, conjunctiva no swelling or erythema Sinuses: No Frontal/maxillary tenderness ENT/Mouth: Ext aud canals clear, TMs without erythema, bulging. No erythema, swelling, or exudate on post pharynx.  Tonsils not swollen or erythematous. Hearing normal. Occasional dry cough in office.  Neck: Supple, thyroid normal.  Respiratory: Respiratory effort normal, BS equal bilaterally without rales, rhonchi, wheezing or stridor.  Cardio: RRR with no MRGs. Brisk peripheral pulses without edema.  Abdomen: Soft, + BS.  Non tender, no guarding, rebound, hernias, masses. Lymphatics: Non tender without lymphadenopathy.  Musculoskeletal: Full ROM, 5/5 strength, normal gait.  Skin: Warm, dry without rashes, lesions, ecchymosis.  Neuro: Cranial nerves intact. Normal muscle tone, no cerebellar  symptoms. Sensation intact.  Psych: Awake and oriented X 3, normal affect, Insight and Judgment appropriate.     Izora Ribas, NP 2:52 PM [facility]

## 2018-11-19 ENCOUNTER — Ambulatory Visit: Payer: BC Managed Care – PPO | Admitting: Adult Health

## 2018-12-21 ENCOUNTER — Ambulatory Visit: Payer: BC Managed Care – PPO | Admitting: Physician Assistant

## 2018-12-21 ENCOUNTER — Encounter: Payer: Self-pay | Admitting: Physician Assistant

## 2018-12-21 ENCOUNTER — Other Ambulatory Visit: Payer: Self-pay

## 2018-12-21 VITALS — BP 128/76 | HR 90 | Temp 97.3°F | Resp 14 | Wt 202.0 lb

## 2018-12-21 DIAGNOSIS — R1012 Left upper quadrant pain: Secondary | ICD-10-CM

## 2018-12-21 DIAGNOSIS — R0781 Pleurodynia: Secondary | ICD-10-CM | POA: Diagnosis not present

## 2018-12-21 DIAGNOSIS — R0602 Shortness of breath: Secondary | ICD-10-CM | POA: Diagnosis not present

## 2018-12-21 NOTE — Patient Instructions (Signed)
Get salonpas and try that, can take tylenol too Can be musculoskeletal but will rule out worse causes.  Can do heating pad too  Please go to the ER if you have any severe AB pain, unable to hold down food/water, blood in stool or vomit, chest pain, shortness of breath, or any worsening symptoms.    Abdominal Pain, Adult Abdominal pain can be caused by many things. Often, abdominal pain is not serious and it gets better with no treatment or by being treated at home. However, sometimes abdominal pain is serious. Your health care provider will do a medical history and a physical exam to try to determine the cause of your abdominal pain. Follow these instructions at home:  Take over-the-counter and prescription medicines only as told by your health care provider. Do not take a laxative unless told by your health care provider.  Drink enough fluid to keep your urine clear or pale yellow.  Watch your condition for any changes.  Keep all follow-up visits as told by your health care provider. This is important. Contact a health care provider if:  Your abdominal pain changes or gets worse.  You are not hungry or you lose weight without trying.  You are constipated or have diarrhea for more than 2-3 days.  You have pain when you urinate or have a bowel movement.  Your abdominal pain wakes you up at night.  Your pain gets worse with meals, after eating, or with certain foods.  You are throwing up and cannot keep anything down.  You have a fever. Get help right away if:  Your pain does not go away as soon as your health care provider told you to expect.  You cannot stop throwing up.  Your pain is only in areas of the abdomen, such as the right side or the left lower portion of the abdomen.  You have bloody or black stools, or stools that look like tar.  You have severe pain, cramping, or bloating in your abdomen.  You have signs of dehydration, such as: ? Dark urine, very little  urine, or no urine. ? Cracked lips. ? Dry mouth. ? Sunken eyes. ? Sleepiness. ? Weakness. This information is not intended to replace advice given to you by your health care provider. Make sure you discuss any questions you have with your health care provider. Document Released: 10/03/2004 Document Revised: 07/14/2015 Document Reviewed: 06/07/2015 Elsevier Interactive Patient Education  2020 [employer].    Laboratory Tests and Diagnostic Procedures (6th ed., p. [id number]). [place], [place]: Elsevier.">  D-Dimer Test Why am I having this test? The D-dimer test is used to help diagnose or rule out conditions that cause abnormal or excessive blood clotting, such as thrombosis or disseminated intravascular coagulation (DIC). This test may also be used to monitor treatment for a blood clotting disease. What is being tested? This test measures the level of D-dimer in the blood. D-dimer is a substance that is released in the blood when certain chemicals in the body work to break up a blood clot. What kind of sample is taken?  A blood sample is required for this test. It is usually collected by inserting a needle into a blood vessel. Tell a health care provider about:  Any medical conditions you have.  Any recent surgeries you have had.  Whether you are pregnant or may be pregnant. How are the results reported? Your test results will be reported as values. Your health care provider will compare your results  to normal ranges that were established after testing a large group of people (reference ranges). Reference ranges may vary among labs and hospitals. For this test, a common reference range is less than 0.5 mcg/mL. What do the results mean? A D-dimer level that is higher than the reference range may mean that you have a blood clotting condition, such as:  Deep vein thrombosis (DVT).  Pulmonary embolism (PE).  DIC.  Blood clot breakdown (primary fibrinolysis).  Arterial  thromboembolism.  Sickle cell anemia. D-dimer levels can also be raised for other reasons, including:  Recent surgery.  Certain cancers.  Pregnancy.  Liver disease.  High levels of a protein (rheumatoid factor) that can indicate certain autoimmune disorders.  Infection. If you are having the D-dimer test to monitor treatment for a blood clotting disease, a decreased level of D-dimer means that the treatment is working. Talk with your health care provider about what your results mean. Questions to ask your health care provider Ask your health care provider, or the department that is doing the test:  When will my results be ready?  How will I get my results?  What are my treatment options?  What other tests do I need?  What are my next steps? Summary  The D-dimer test is used to help diagnose or rule out conditions that cause abnormal or excessive blood clotting, such as thrombosis or disseminated intravascular coagulation (DIC).  This test may also be used to monitor treatment for a blood clotting disease.  A blood sample is required for this test.  Talk with your health care provider about what your results mean. This information is not intended to replace advice given to you by your health care provider. Make sure you discuss any questions you have with your health care provider. Document Released: 01/17/2004 Document Revised: 04/21/2018 Document Reviewed: 04/21/2018 Elsevier Patient Education  [place].

## 2018-12-21 NOTE — Progress Notes (Signed)
Subjective:    Patient ID: Natalie Baker, female    DOB: 13-May-1955, 63 y.o.   MRN: [id number]  HPI 64 y.o. WF morbidly obese female with history of HTN, chol, preDM, hypothyroidism presents with left sided flank pain.  She states she worked Friday, washed clothes, cooked and did normal things, no injury or fall, and then started with stabbing left sided upper AB pain Friday night, no radiation. Worse with deep breath, worse with rotation to the left.  Still having pain left side, constant, worse at times, stabbing pain.  Not associated with food. No fever, chills. No nausea, vomiting, diarrhea. No cough, SOB, CP. No palpitations. No leg swelling or pain. No blood in urine, no urinary symptoms. No fatigue more than usual.  She is on estrogen patch q weekly, she is on mobic AS needed  Heart disease in mother, has family history of kidney stones/gallstones.  No family history of blood clots but her father died 1- unknown why.  She has taken gas x, did not help She has not tried tylenol or aleve.   CXR 2014 US Renal 08/2017 IMPRESSION: 1. 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 Appearance similar to prior exam. 2. Remainder of exam unremarkable. Colonoscopy 03/2018 normal  Blood pressure 128/76, pulse 90, temperature (!) 97.3 F (36.3 C), resp. rate 14, weight 202 lb (91.6 kg), SpO2 97 %.  Wt Readings from Last 3 Encounters:  12/21/18 202 lb (91.6 kg)  08/13/18 206 lb (93.4 kg)  05/26/18 207 lb (93.9 kg)     Medications  Current Outpatient Medications (Endocrine & Metabolic):  .  estradiol (VIVELLE-DOT) 0.05 MG/24HR patch, Place 1 patch onto the skin 2 (two) times a week. Marland Kitchen  NATURE-THROID 65 MG tablet,  .  progesterone (PROMETRIUM) 100 MG capsule,   Current Outpatient Medications (Cardiovascular):  .  rosuvastatin (CRESTOR) 5 MG tablet, Start by taking 1 tab by mouth at night once weekly; if tolerating,  slowly increase frequency up to taking three nights a week (MWF).   Current Outpatient Medications (Analgesics):  .  meloxicam (MOBIC) 7.5 MG tablet, Take 7.5 mg by mouth daily.   Current Outpatient Medications (Other):  .  b complex vitamins tablet, Take 1 tablet by mouth daily. .  Biotin 1000 MCG CHEW, Chew 5,000 mcg by mouth daily. .  cholecalciferol (VITAMIN D3) 25 MCG (1000 UT) tablet, Take 10,000 Units by mouth daily. .  Coenzyme Q10 (COQ10 PO), Take 1 tablet by mouth 2 (two) times daily. .  Flaxseed, Linseed, (FLAX SEED OIL PO), Take 2 capsules by mouth 2 (two) times daily. .  metaxalone (SKELAXIN) 800 MG tablet, Take 1 tablet (800 mg total) by mouth 3 (three) times daily. Marland Kitchen  OVER THE COUNTER MEDICATION, daily. Tumeric  Problem list She has Hyperlipidemia; Vitamin D deficiency; Hypothyroidism; Morbid obesity ([facility]) BMI 35+ with prediabetes, hyperlipidemia, htn; Osteoporosis; Prediabetes; and Labile hypertension on their problem list.  Family History family history includes Alcohol abuse in her father; Bone cancer in her maternal aunt; Breast cancer in her maternal aunt; Breast cancer (age of onset: 49) in her niece; Breast cancer (age of onset: 66) in her sister; Cancer (age of onset: 49) in her mother; Diabetes in her brother, mother, paternal grandmother, and sister; Hashimoto's thyroiditis in her sister; Heart disease in her father and maternal aunt; Heart disease (age of onset: 53) in her mother; Hyperlipidemia in her brother and maternal aunt; Hypertension in her brother, father, and  mother; Hypothyroidism in her brother and sister; Liver disease in her maternal grandmother and mother; Thyroid disease in her mother.   Review of Systems     Objective:   Physical Exam Constitutional:      General: She is not in acute distress.    Appearance: Normal appearance. She is obese.     Comments: Pain with moving from chair to exam table.Marland Kitchen   HENT:     Head: Normocephalic.     Right  Ear: Tympanic membrane and ear canal normal.     Left Ear: Tympanic membrane and ear canal normal.     Nose: Nose normal.     Mouth/Throat:     Mouth: Mucous membranes are dry.     Pharynx: No oropharyngeal exudate or posterior oropharyngeal erythema.  Eyes:     Extraocular Movements: Extraocular movements intact.     Pupils: Pupils are equal, round, and reactive to light.  Cardiovascular:     Rate and Rhythm: Regular rhythm. Tachycardia present.     Pulses: Normal pulses.     Comments: Distant heart sounds Pulmonary:     Effort: Pulmonary effort is normal.     Breath sounds: Normal breath sounds. No wheezing or rhonchi.  Chest:     Chest wall: No tenderness.  Abdominal:     Palpations: Abdomen is soft. There is no mass.     Tenderness: There is abdominal tenderness (left upper/mid quadrant, no rebound). There is guarding. There is no right CVA tenderness, left CVA tenderness or rebound.     Hernia: No hernia is present.     Comments: obese  Musculoskeletal:        General: No swelling or tenderness.     Right lower leg: No edema.     Left lower leg: No edema.  Skin:    General: Skin is warm and dry.     Findings: No rash (no rash on left side).  Neurological:     General: No focal deficit present.     Mental Status: She is oriented to person, place, and time.       Assessment & Plan:   Pleuritic chest pain -     D-dimer, quantitative - low risk other than estrogen patch and morbid obesity, will get Ddimer Go to the ER if any chest pain, shortness of breath, nausea, dizziness, severe HA, changes vision/speech  Left upper quadrant pain ? Muscular some pain with movement, will get on tylenol/salonpas No rash, if gets rash will contact us to treat shingles Will get labs to look for infection, CBC abnormalities for splenomegaly Rule out colitis, diverticulitis with location of pain, no rebound -     CBC with Diff  -     COMPLETE METABOLIC PANEL WITH GFR -     CT Abdomen  Pelvis W Contrast Diverticulits; Future  The patient was advised to call immediately if she has any concerning symptoms in the interval. The patient voices understanding of current treatment options and is in agreement with the current care plan.The patient knows to call the clinic with any problems, questions or concerns or go to the ER if any further progression of symptoms.

## 2018-12-22 ENCOUNTER — Emergency Department (HOSPITAL_COMMUNITY): Payer: BC Managed Care – PPO

## 2018-12-22 ENCOUNTER — Other Ambulatory Visit: Payer: Self-pay

## 2018-12-22 ENCOUNTER — Telehealth: Payer: Self-pay | Admitting: Physician Assistant

## 2018-12-22 ENCOUNTER — Encounter (HOSPITAL_COMMUNITY): Payer: Self-pay | Admitting: Emergency Medicine

## 2018-12-22 ENCOUNTER — Other Ambulatory Visit: Payer: Self-pay | Admitting: Adult Health

## 2018-12-22 ENCOUNTER — Emergency Department (HOSPITAL_COMMUNITY)
Admission: EM | Admit: 2018-12-22 | Discharge: 2018-12-22 | Disposition: A | Discharge status: Home / Self Care | Payer: BC Managed Care – PPO | Attending: Emergency Medicine | Admitting: Emergency Medicine

## 2018-12-22 DIAGNOSIS — E039 Hypothyroidism, unspecified: Secondary | ICD-10-CM | POA: Diagnosis not present

## 2018-12-22 DIAGNOSIS — R0602 Shortness of breath: Secondary | ICD-10-CM | POA: Diagnosis not present

## 2018-12-22 DIAGNOSIS — R591 Generalized enlarged lymph nodes: Secondary | ICD-10-CM | POA: Diagnosis not present

## 2018-12-22 DIAGNOSIS — R1012 Left upper quadrant pain: Secondary | ICD-10-CM

## 2018-12-22 DIAGNOSIS — Z87891 Personal history of nicotine dependence: Secondary | ICD-10-CM | POA: Insufficient documentation

## 2018-12-22 DIAGNOSIS — R9389 Abnormal findings on diagnostic imaging of other specified body structures: Secondary | ICD-10-CM

## 2018-12-22 DIAGNOSIS — I700 Atherosclerosis of aorta: Secondary | ICD-10-CM | POA: Insufficient documentation

## 2018-12-22 DIAGNOSIS — R0989 Other specified symptoms and signs involving the circulatory and respiratory systems: Secondary | ICD-10-CM

## 2018-12-22 DIAGNOSIS — R109 Unspecified abdominal pain: Secondary | ICD-10-CM | POA: Diagnosis not present

## 2018-12-22 DIAGNOSIS — Z79899 Other long term (current) drug therapy: Secondary | ICD-10-CM | POA: Insufficient documentation

## 2018-12-22 DIAGNOSIS — R935 Abnormal findings on diagnostic imaging of other abdominal regions, including retroperitoneum: Secondary | ICD-10-CM

## 2018-12-22 LAB — COMPREHENSIVE METABOLIC PANEL WITH GFR
ALT: 26 U/L (ref 0–44)
AST: 27 U/L (ref 15–41)
Albumin: 4.1 g/dL (ref 3.5–5.0)
Alkaline Phosphatase: 70 U/L (ref 38–126)
Anion gap: 10 (ref 5–15)
BUN: 13 mg/dL (ref 8–23)
CO2: 23 mmol/L (ref 22–32)
Calcium: 9.2 mg/dL (ref 8.9–10.3)
Chloride: 104 mmol/L (ref 98–111)
Creatinine, Ser: 0.73 mg/dL (ref 0.44–1.00)
GFR calc Af Amer: >60 mL/min
GFR calc non Af Amer: >60 mL/min
Glucose, Bld: 110 mg/dL — ABNORMAL HIGH (ref 70–99)
Potassium: 3.9 mmol/L (ref 3.5–5.1)
Sodium: 137 mmol/L (ref 135–145)
Total Bilirubin: 0.4 mg/dL (ref 0.3–1.2)
Total Protein: 7.2 g/dL (ref 6.5–8.1)

## 2018-12-22 LAB — COMPLETE METABOLIC PANEL WITH GFR
AG Ratio: 1.6 (calc) (ref 1.0–2.5)
ALT: 21 U/L (ref 6–29)
AST: 21 U/L (ref 10–35)
Albumin: 4.4 g/dL (ref 3.6–5.1)
Alkaline phosphatase (APISO): 79 U/L (ref 37–153)
BUN: 12 mg/dL (ref 7–25)
CO2: 28 mmol/L (ref 20–32)
Calcium: 9.6 mg/dL (ref 8.6–10.4)
Chloride: 102 mmol/L (ref 98–110)
Creat: 0.74 mg/dL (ref 0.50–0.99)
GFR, Est African American: 100 mL/min/{1.73_m2} (ref >=60)
GFR, Est Non African American: 86 mL/min/{1.73_m2} (ref >=60)
Globulin: 2.7 g/dL (calc) (ref 1.9–3.7)
Glucose, Bld: 90 mg/dL (ref 65–99)
Potassium: 4.3 mmol/L (ref 3.5–5.3)
Sodium: 140 mmol/L (ref 135–146)
Total Bilirubin: 0.3 mg/dL (ref 0.2–1.2)
Total Protein: 7.1 g/dL (ref 6.1–8.1)

## 2018-12-22 LAB — URINALYSIS, ROUTINE W REFLEX MICROSCOPIC
Bilirubin Urine: NEGATIVE
Glucose, UA: NEGATIVE mg/dL
Ketones, ur: NEGATIVE mg/dL
Leukocytes,Ua: NEGATIVE
Nitrite: NEGATIVE
Protein, ur: NEGATIVE mg/dL
Specific Gravity, Urine: 1.017 (ref 1.005–1.030)
pH: 5 (ref 5.0–8.0)

## 2018-12-22 LAB — CBC WITH DIFFERENTIAL/PLATELET
Absolute Monocytes: 687 cells/uL (ref 200–950)
Basophils Absolute: 47 cells/uL (ref 0–200)
Basophils Relative: 0.6 %
Eosinophils Absolute: 63 cells/uL (ref 15–500)
Eosinophils Relative: 0.8 %
HCT: 43.9 % (ref 35.0–45.0)
Hemoglobin: 14.5 g/dL (ref 11.7–15.5)
Lymphs Abs: 2196 cells/uL (ref 850–3900)
MCH: 26.9 pg — ABNORMAL LOW (ref 27.0–33.0)
MCHC: 33 g/dL (ref 32.0–36.0)
MCV: 81.3 fL (ref 80.0–100.0)
MPV: 10.7 fL (ref 7.5–12.5)
Monocytes Relative: 8.7 %
Neutro Abs: 4906 cells/uL (ref 1500–7800)
Neutrophils Relative %: 62.1 %
Platelets: 212 10*3/uL (ref 140–400)
RBC: 5.4 10*6/uL — ABNORMAL HIGH (ref 3.80–5.10)
RDW: 13.3 % (ref 11.0–15.0)
Total Lymphocyte: 27.8 %
WBC: 7.9 10*3/uL (ref 3.8–10.8)

## 2018-12-22 LAB — CBC
HCT: 44.6 % (ref 36.0–46.0)
Hemoglobin: 13.9 g/dL (ref 12.0–15.0)
MCH: 26.7 pg (ref 26.0–34.0)
MCHC: 31.2 g/dL (ref 30.0–36.0)
MCV: 85.6 fL (ref 80.0–100.0)
Platelets: 197 10*3/uL (ref 150–400)
RBC: 5.21 MIL/uL — ABNORMAL HIGH (ref 3.87–5.11)
RDW: 14 % (ref 11.5–15.5)
WBC: 7.7 10*3/uL (ref 4.0–10.5)
nRBC: 0 % (ref 0.0–0.2)

## 2018-12-22 LAB — LIPASE, BLOOD: Lipase: 32 U/L (ref 11–51)

## 2018-12-22 LAB — TROPONIN I (HIGH SENSITIVITY): Troponin I (High Sensitivity): <2 ng/L

## 2018-12-22 LAB — D-DIMER, QUANTITATIVE: D-Dimer, Quant: 1.85 mcg/mL FEU — ABNORMAL HIGH (ref <0.50)

## 2018-12-22 MED ORDER — ONDANSETRON HCL 4 MG/2ML IJ SOLN
4.0000 mg | Freq: Once | INTRAMUSCULAR | Status: AC
  Administered 2018-12-22: 4 mg via INTRAVENOUS
  Filled 2018-12-22: qty 2

## 2018-12-22 MED ORDER — IOHEXOL 350 MG/ML SOLN
100.0000 mL | Freq: Once | INTRAVENOUS | Status: AC | PRN
  Administered 2018-12-22: 100 mL via INTRAVENOUS

## 2018-12-22 MED ORDER — SODIUM CHLORIDE 0.9% FLUSH: 3.0000 mL | Freq: Once | INTRAVENOUS | Status: DC

## 2018-12-22 MED ORDER — MORPHINE SULFATE (PF) 4 MG/ML IV SOLN
4.0000 mg | Freq: Once | INTRAVENOUS | Status: AC
  Administered 2018-12-22: 4 mg via INTRAVENOUS
  Filled 2018-12-22: qty 1

## 2018-12-22 MED ORDER — SODIUM CHLORIDE (PF) 0.9 % IJ SOLN
INTRAMUSCULAR | Status: AC
  Filled 2018-12-22: qty 50

## 2018-12-22 NOTE — Telephone Encounter (Signed)
Natalie Baker has been cld and scheduled to see [name]/Dr. Julien Nordmann on 12/16 at 1:30pm w/labs at 1pm. Pt has been made aware to arrive 15 minutes early.

## 2018-12-22 NOTE — Addendum Note (Signed)
Addended by: Vladimir Crofts on: 12/22/2018 09:38 AM   Modules accepted: Orders

## 2018-12-22 NOTE — Progress Notes (Signed)
63 y.o. female calls today requesting referral; she was seen yesterday by Estill B[place]berg, [place] for 3 day history of stabbing left sided abdominal pain, flank pain, pleuritic chest pain and presented to ED for evaluation of worsening abdominal pain this AM; D dimer was notably elevated yesterday at 1.85 and she underwent CTA chest as well as CT abd/pelvis in ED which r/o PE but demonstrated:   Multifocal mild adenopathy in the chest and abdomen, including left supraclavicular, axillary, epicardial, retroperitoneal, porta hepatis and ileocolic. Largest lymph node measures 19 mm greatest dimension in the retroperitoneum. Overall appearance is suspicious for lymphoproliferative disorder such as lymphoma. Recommend clinical/laboratory evaluation, consider oncologic referral. Metastatic disease is also considered, however multifocal nature argues against this.  We discussed evaluation and initial lab workup here in office and oncology referral; she declines initiating lab workup through our office, would prefer to proceed with oncology referral for further workup and evaluation. She requests evaluation by Dr. Earlie Server if possible.   She reports abdominal pain is shifting, was recommended trial of PPI/H2i; she is skeptical this will help; she states heat application is helping with abdominal pain. Denies other accompaniments - n/v/d/c, fever/chills, urinary sx, vaginal discharge, reflux, cough, hoarseness. She will try PPI/H2i as recommended and follow up if not improving further.    Allergies:  Allergies  Allergen Reactions  . Ppd [Tuberculin Purified Protein Derivative]     Positive reaction.  Negative Chest Xray 06/16/12   Medical History:  has Hyperlipidemia; Vitamin D deficiency; Hypothyroidism; Morbid obesity ([place]) BMI 35+ with prediabetes, hyperlipidemia, htn; Osteoporosis; Prediabetes; and Labile hypertension on their problem list. Surgical History:  She  has a past surgical history that includes  Abdominal hysterectomy (1997); Cesarean section (1983); Colonoscopy w/ biopsies (12/29/2006); laparoscopy; and Upper gastrointestinal endoscopy. Family History:  Herfamily history includes Alcohol abuse in her father; Bone cancer in her maternal aunt; Breast cancer in her maternal aunt; Breast cancer (age of onset: 70) in her niece; Breast cancer (age of onset: 62) in her sister; Cancer (age of onset: 22) in her mother; Diabetes in her brother, mother, paternal grandmother, and sister; Hashimoto's thyroiditis in her sister; Heart disease in her father and maternal aunt; Heart disease (age of onset: 39) in her mother; Hyperlipidemia in her brother and maternal aunt; Hypertension in her brother, father, and mother; Hypothyroidism in her brother and sister; Liver disease in her maternal grandmother and mother; Thyroid disease in her mother. Social History:   reports that she quit smoking about 22 years ago. Her smoking use included cigarettes. She quit after 15.00 years of use. She has never used smokeless tobacco. She reports that she does not drink alcohol or use drugs.   Review of Systems  Constitutional: Negative for chills, diaphoresis, fever, malaise/fatigue and weight loss.  HENT: Negative.   Eyes: Negative.   Respiratory: Negative for cough, sputum production, shortness of breath and wheezing.   Cardiovascular: Negative for chest pain, palpitations, claudication, leg swelling and PND.  Gastrointestinal: Positive for abdominal pain (moving, improved with heat application). Negative for blood in stool, constipation, diarrhea, heartburn, melena, nausea and vomiting.  Genitourinary: Negative for dysuria, frequency and urgency.  Musculoskeletal: Negative for back pain, joint pain and myalgias.  Skin: Negative for rash.  Neurological: Negative for dizziness.  Endo/Heme/Allergies: Negative for polydipsia. Does not bruise/bleed easily.   Exam:  General : Well sounding patient in no apparent  distress HEENT: no hoarseness, no cough for duration of visit Lungs: speaks in complete sentences, no audible wheezing, no  apparent distress Neurological: alert, oriented x 3 Psychiatric: pleasant, judgement appropriate    Diagnoses and all orders for this visit:  Lymphadenopathy Abnormal chest CT Abnormal abdominal CT scan Ct with lymphadenopathy of chest and abdomen concerning for lymphoproliferative disorder/lymphoma; discussed evaluation options;  She declines initiation of lab workup by our office; would prefer to proceed with oncology referral and complete full workup there Referral placed today  -     Ambulatory referral to Oncology  Abdominal pain; [facility] ED workup unremarkable excepting above Was recommended trial of PPI/H2i which she will initiate She reports improved with heat application; will continue No other concerning features at this time She will pursue oncology evaluation for possible lyphoproliferative disorder; will follow up here if abdominal pain is not improving/manageable in the interim with current plan Please go to the ER if you have any severe AB pain, unable to hold down food/water, blood in stool or vomit, chest pain, shortness of breath, or any worsening symptoms.   Aortic atherosclerosis ([place]) Per CT 12/21/1008 Control blood pressure, cholesterol, glucose, increase exercise.    Izora Ribas, NP 1:10 PM [facility]   Future Appointments  Date Time Provider [place]  12/23/2018  2:00 PM [name], MD [facility] None  02/16/2019  3:45 PM Liane Comber, NP [facility] None  08/18/2019  3:00 PM Liane Comber, NP [facility] None

## 2018-12-22 NOTE — Discharge Instructions (Signed)
Try zantac or pepcid twice a day.  Try to avoid things that may make this worse, most commonly these are spicy foods tomato based products fatty foods chocolate and peppermint.  Alcohol and tobacco can also make this worse.  Return to the emergency department for sudden worsening pain fever or inability to eat or drink. ° °

## 2018-12-22 NOTE — ED Triage Notes (Signed)
Patient is complaining of left abdominal pain. Patient states she was suppose to get a CT done outpatient.

## 2018-12-22 NOTE — ED Provider Notes (Signed)
[facility] DEPT Provider Note   CSN: [id number] Arrival date & time: 12/22/18  [id number]     History Chief Complaint  Patient presents with  . Abdominal Pain    Natalie Baker is a 63 y.o. female.  63 yo F with a cc of LUQ pain.  Going on for the past couple days.  Has been slightly more medial.  Pain seems to come and go.  No nausea vomiting or fever.  No diarrhea or constipation.  Patient saw her family doctor yesterday and is scheduled for a CT scan in 7 days time.  She felt that the pain had worsened significantly and so came to the ED for evaluation.  The history is provided by the patient.  Abdominal Pain Pain location:  LUQ Pain quality: cramping and sharp   Pain radiates to:  Does not radiate Pain severity:  Moderate Onset quality:  Gradual Duration:  2 days Timing:  Constant Progression:  Waxing and waning Chronicity:  New Relieved by:  Nothing Worsened by:  Nothing Ineffective treatments:  None tried Associated symptoms: no chest pain, no chills, no dysuria, no fever, no nausea, no shortness of breath and no vomiting        Past Medical History:  Diagnosis Date  . Allergy   . Colon polyp 12/29/2006  . 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 Hyperlipidemia   . Hypothyroidism   . Low kidney function   . Obesity    BMI 34  . Osteoporosis   . Palpitations   . Vitamin D deficiency     Patient Active Problem List   Diagnosis Date Noted  . Prediabetes 07/26/2015  . 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 Morbid obesity ([place]) BMI 35+ with prediabetes, hyperlipidemia, htn   . Osteoporosis   . Hypothyroidism 12/25/2012  . Hyperlipidemia   . Vitamin D deficiency     Past Surgical History:  Procedure Laterality Date  . ABDOMINAL HYSTERECTOMY  1997  . CESAREAN SECTION  1983  . COLONOSCOPY W/ BIOPSIES  12/29/2006  . LAPAROSCOPY     1991 for emdometriosis  . UPPER GASTROINTESTINAL ENDOSCOPY       OB History   No obstetric history on  file.     Family History  Problem Relation Age of Onset  . Heart disease Mother 50       chf  . Diabetes Mother   . Thyroid disease Mother   . Cancer Mother 49       female/ skin  . Hypertension Mother   . Liver disease Mother   . Hypertension Father   . Alcohol abuse Father   . Heart disease Father   . Breast cancer Maternal Aunt   . Hyperlipidemia Maternal Aunt   . Heart disease Maternal Aunt   . Hyperlipidemia Brother   . Hypertension Brother        x 2  . Diabetes Brother   . Hypothyroidism Brother   . Liver disease Maternal Grandmother   . Diabetes Paternal Grandmother   . Bone cancer Maternal Aunt        and skull  . Breast cancer Sister 45  . Diabetes Sister   . Hypothyroidism Sister   . Hashimoto's thyroiditis Sister   . Breast cancer Niece 101  . Colon cancer Neg Hx   . Colon polyps Neg Hx   . Esophageal cancer Neg Hx   . Rectal cancer Neg Hx   . Stomach cancer Neg Hx     Social  History   Tobacco Use  . Smoking status: Former Smoker    Years: 15.00    Types: Cigarettes    Quit date: 01/08/1996    Years since quitting: 22.9  . Smokeless tobacco: Never Used  . Tobacco comment: quit 1998 but is exposed to 2nd hand smoke  Substance Use Topics  . Alcohol use: No  . Drug use: No    Home Medications Prior to Admission medications   Medication Sig Start Date End Date Taking? Authorizing Provider  b complex vitamins tablet Take 1 tablet by mouth daily.    [provider]  Biotin 1000 MCG CHEW Chew 5,000 mcg by mouth daily.    [provider]  cholecalciferol (VITAMIN D3) 25 MCG (1000 UT) tablet Take 10,000 Units by mouth daily.    [provider]  Coenzyme Q10 (COQ10 PO) Take 1 tablet by mouth 2 (two) times daily.    [provider]  estradiol (VIVELLE-DOT) 0.05 MG/24HR patch Place 1 patch onto the skin 2 (two) times a week.    [provider]  Flaxseed, Linseed, (FLAX SEED OIL PO) Take 2 capsules by mouth 2  (two) times daily.    [provider]  meloxicam (MOBIC) 7.5 MG tablet Take 7.5 mg by mouth daily.    [provider]  metaxalone (SKELAXIN) 800 MG tablet Take 1 tablet (800 mg total) by mouth 3 (three) times daily. 08/22/15   Rolene Course, PA-C  NATURE-THROID 65 MG tablet  02/22/18   [provider]  OVER THE COUNTER MEDICATION daily. Tumeric    [provider]  progesterone (PROMETRIUM) 100 MG capsule  01/19/18   [provider]  rosuvastatin (CRESTOR) 5 MG tablet Start by taking 1 tab by mouth at night once weekly; if tolerating, slowly increase frequency up to taking three nights a week (MWF). 08/19/18   Liane Comber, NP    Allergies    Ppd [tuberculin purified protein derivative]  Review of Systems   Review of Systems  Constitutional: Negative for chills and fever.  HENT: Negative for congestion and rhinorrhea.   Eyes: Negative for redness and visual disturbance.  Respiratory: Negative for shortness of breath and wheezing.   Cardiovascular: Negative for chest pain and palpitations.  Gastrointestinal: Positive for abdominal pain. Negative for nausea and vomiting.  Genitourinary: Negative for dysuria and urgency.  Musculoskeletal: Negative for arthralgias and myalgias.  Skin: Negative for pallor and wound.  Neurological: Negative for dizziness and headaches.    Physical Exam Updated Vital Signs BP 133/65   Pulse (!) 111   Temp 98.2 F (36.8 C) (Oral)   Resp 17   Ht 5\' 2"  (1.575 m)   Wt 91.6 kg   SpO2 95%   BMI 36.95 kg/m   Physical Exam Vitals and nursing note reviewed.  Constitutional:      General: She is not in acute distress.    Appearance: She is well-developed. She is not diaphoretic.  HENT:     Head: Normocephalic and atraumatic.  Eyes:     Pupils: Pupils are equal, round, and reactive to light.  Cardiovascular:     Rate and Rhythm: Normal rate and regular rhythm.     Heart sounds: No murmur. No friction rub.  No gallop.   Pulmonary:     Effort: Pulmonary effort is normal.     Breath sounds: No wheezing or rales.  Abdominal:     General: There is no distension.     Palpations: Abdomen is  soft.     Tenderness: There is abdominal tenderness (mild luq).  Musculoskeletal:        General: No tenderness.     Cervical back: Normal range of motion and neck supple.  Skin:    General: Skin is warm and dry.  Neurological:     Mental Status: She is alert and oriented to person, place, and time.  Psychiatric:        Behavior: Behavior normal.     ED Results / Procedures / Treatments   Labs (all labs ordered are listed, but only abnormal results are displayed) Labs Reviewed  COMPREHENSIVE METABOLIC PANEL - Abnormal; Notable for the following components:      Result Value   Glucose, Bld 110 (*)    All other components within normal limits  CBC - Abnormal; Notable for the following components:   RBC 5.21 (*)    All other components within normal limits  URINALYSIS, ROUTINE W REFLEX MICROSCOPIC - Abnormal; Notable for the following components:   Hgb urine dipstick SMALL (*)    Bacteria, UA MANY (*)    All other components within normal limits  LIPASE, BLOOD  TROPONIN I (HIGH SENSITIVITY)    EKG EKG Interpretation  Date/Time:  Tuesday December 22 2018 05:42:15 EST Ventricular Rate:  85 PR Interval:    QRS Duration: 66 QT Interval:  500 QTC Calculation: 595 R Axis:   -2 Text Interpretation: Sinus rhythm Low voltage, extremity and precordial leads Prolonged QT interval No old tracing to compare Confirmed by Deno Etienne [phone]) on 12/22/2018 6:28:43 AM   Radiology CT Angio Chest PE W and/or Wo Contrast  Result Date: 12/22/2018 CLINICAL DATA:  Left upper quadrant abdominal pain. Shortness of breath. EXAM: CT ANGIOGRAPHY CHEST CT ABDOMEN AND PELVIS WITH CONTRAST TECHNIQUE: Multidetector CT imaging of the chest was performed using the standard protocol during bolus administration of  intravenous contrast. Multiplanar CT image reconstructions and MIPs were obtained to evaluate the vascular anatomy. Multidetector CT imaging of the abdomen and pelvis was performed using the standard protocol during bolus administration of intravenous contrast. CONTRAST:  128mL OMNIPAQUE IOHEXOL 350 MG/ML SOLN COMPARISON:  None. FINDINGS: CTA CHEST FINDINGS Cardiovascular: There are no filling defects within the pulmonary arteries to suggest pulmonary embolus. No aortic dissection or aneurysm. Heart is normal in size. No pericardial effusion. Mediastinum/Nodes: Enlarged left supraclavicular nodes measuring up to 15 mm short axis, series 4, image 7. Prominent subcarinal node measuring 10 mm. Prominent bilateral axillary nodes measuring 10 mm on the right and 11 mm on the left. There is a prominent left epicardial node measuring 10 mm. No hilar adenopathy. Decompressed esophagus. No thyroid nodule. Prominent right epicardial fat pad. Lungs/Pleura: No acute airspace disease. No pleural fluid. No evidence pulmonary edema. Mild lower lobe bronchial thickening. Compressive atelectasis in the right lower lobe related to mild diaphragm elevation. Trachea and mainstem bronchi are patent. 3 mm pleural-based nodule in the right lower lobe series 6, image 53. Trace left pleural thickening without effusion. Musculoskeletal: Bone island in right anterior rib. There are no acute or suspicious osseous abnormalities. Review of the MIP images confirms the above findings. CT ABDOMEN and PELVIS FINDINGS Hepatobiliary: Elongated left lobe of the liver wrapping into the left upper quadrant. No focal liver lesion. Minimal high density within the gallbladder neck may be stones or sludge. No pericholecystic inflammation. No biliary dilatation. Pancreas: No ductal dilatation or inflammation. Spleen: Normal in size, greatest splenic dimension 11.8 cm AP. No focal abnormality. Adrenals/Urinary  Tract: No adrenal nodule. No hydronephrosis or  perinephric edema. Homogeneous renal enhancement with symmetric excretion on delayed phase imaging. Subcentimeter low-density in the lower left kidney is too small to characterize but likely small cyst. Additional parapelvic cysts in the lower left kidney. Urinary bladder is nondistended. Stomach/Bowel: Nondistended stomach without gastric wall thickening. No small bowel obstruction or inflammation. Normal appendix, series 4, image 43 moderate volume of stool in the proximal colon. Small volume of stool distally. Vascular/Lymphatic: Retroperitoneal adenopathy with left periaortic node measuring 17 mm short axis, series 2, image 30 just below the left renal vein. Possible additional lymph node anterior left periaortic station measuring 19 mm, however difficult to differentiate from adjacent small bowel. Multiple additional prominent retroperitoneal nodes extend from the SMA to the iliac bifurcation. Porta hepatis adenopathy with enlarged node measuring 14 mm. Portal caval node measures 12 mm. Enlarged ileocolic nodes, largest measuring 19 mm, series 2, image 53. Additional prominent central mesenteric lymph nodes. Increased number of prominent bilateral external iliac nodes. No inguinal adenopathy. Mild aortic atherosclerosis. No aneurysm. Patent portal vein. Reproductive: Status post hysterectomy. No adnexal masses. Other: No ascites. No free air. Musculoskeletal: There are no acute or suspicious osseous abnormalities. Review of the MIP images confirms the above findings. IMPRESSION: 1. No pulmonary embolus or acute intrathoracic abnormality. 2. Multifocal mild adenopathy in the chest and abdomen, including left supraclavicular, axillary, epicardial, retroperitoneal, porta hepatis and ileocolic. Largest lymph node measures 19 mm greatest dimension in the retroperitoneum. Overall appearance is suspicious for lymphoproliferative disorder such as lymphoma. Recommend clinical/laboratory evaluation, consider oncologic  referral. Metastatic disease is also considered, however multifocal nature argues against this. 3. Possible gallstone or sludge in the gallbladder. Aortic Atherosclerosis (ICD10-I70.0). Electronically Signed   By: Keith Rake M.D.   On: 12/22/2018 06:37   CT ABDOMEN PELVIS W CONTRAST  Result Date: 12/22/2018 CLINICAL DATA:  Left upper quadrant abdominal pain. Shortness of breath. EXAM: CT ANGIOGRAPHY CHEST CT ABDOMEN AND PELVIS WITH CONTRAST TECHNIQUE: Multidetector CT imaging of the chest was performed using the standard protocol during bolus administration of intravenous contrast. Multiplanar CT image reconstructions and MIPs were obtained to evaluate the vascular anatomy. Multidetector CT imaging of the abdomen and pelvis was performed using the standard protocol during bolus administration of intravenous contrast. CONTRAST:  121mL OMNIPAQUE IOHEXOL 350 MG/ML SOLN COMPARISON:  None. FINDINGS: CTA CHEST FINDINGS Cardiovascular: There are no filling defects within the pulmonary arteries to suggest pulmonary embolus. No aortic dissection or aneurysm. Heart is normal in size. No pericardial effusion. Mediastinum/Nodes: Enlarged left supraclavicular nodes measuring up to 15 mm short axis, series 4, image 7. Prominent subcarinal node measuring 10 mm. Prominent bilateral axillary nodes measuring 10 mm on the right and 11 mm on the left. There is a prominent left epicardial node measuring 10 mm. No hilar adenopathy. Decompressed esophagus. No thyroid nodule. Prominent right epicardial fat pad. Lungs/Pleura: No acute airspace disease. No pleural fluid. No evidence pulmonary edema. Mild lower lobe bronchial thickening. Compressive atelectasis in the right lower lobe related to mild diaphragm elevation. Trachea and mainstem bronchi are patent. 3 mm pleural-based nodule in the right lower lobe series 6, image 53. Trace left pleural thickening without effusion. Musculoskeletal: Bone island in right anterior rib.  There are no acute or suspicious osseous abnormalities. Review of the MIP images confirms the above findings. CT ABDOMEN and PELVIS FINDINGS Hepatobiliary: Elongated left lobe of the liver wrapping into the left upper quadrant. No focal liver lesion. Minimal high density within  the gallbladder neck may be stones or sludge. No pericholecystic inflammation. No biliary dilatation. Pancreas: No ductal dilatation or inflammation. Spleen: Normal in size, greatest splenic dimension 11.8 cm AP. No focal abnormality. Adrenals/Urinary Tract: No adrenal nodule. No hydronephrosis or perinephric edema. Homogeneous renal enhancement with symmetric excretion on delayed phase imaging. Subcentimeter low-density in the lower left kidney is too small to characterize but likely small cyst. Additional parapelvic cysts in the lower left kidney. Urinary bladder is nondistended. Stomach/Bowel: Nondistended stomach without gastric wall thickening. No small bowel obstruction or inflammation. Normal appendix, series 4, image 43 moderate volume of stool in the proximal colon. Small volume of stool distally. Vascular/Lymphatic: Retroperitoneal adenopathy with left periaortic node measuring 17 mm short axis, series 2, image 30 just below the left renal vein. Possible additional lymph node anterior left periaortic station measuring 19 mm, however difficult to differentiate from adjacent small bowel. Multiple additional prominent retroperitoneal nodes extend from the SMA to the iliac bifurcation. Porta hepatis adenopathy with enlarged node measuring 14 mm. Portal caval node measures 12 mm. Enlarged ileocolic nodes, largest measuring 19 mm, series 2, image 53. Additional prominent central mesenteric lymph nodes. Increased number of prominent bilateral external iliac nodes. No inguinal adenopathy. Mild aortic atherosclerosis. No aneurysm. Patent portal vein. Reproductive: Status post hysterectomy. No adnexal masses. Other: No ascites. No free air.  Musculoskeletal: There are no acute or suspicious osseous abnormalities. Review of the MIP images confirms the above findings. IMPRESSION: 1. No pulmonary embolus or acute intrathoracic abnormality. 2. Multifocal mild adenopathy in the chest and abdomen, including left supraclavicular, axillary, epicardial, retroperitoneal, porta hepatis and ileocolic. Largest lymph node measures 19 mm greatest dimension in the retroperitoneum. Overall appearance is suspicious for lymphoproliferative disorder such as lymphoma. Recommend clinical/laboratory evaluation, consider oncologic referral. Metastatic disease is also considered, however multifocal nature argues against this. 3. Possible gallstone or sludge in the gallbladder. Aortic Atherosclerosis (ICD10-I70.0). Electronically Signed   By: Keith Rake M.D.   On: 12/22/2018 06:37    Procedures Procedures (including critical care time)  Medications Ordered in ED Medications  sodium chloride flush (NS) 0.9 % injection 3 mL (has no administration in time range)  morphine 4 MG/ML injection 4 mg (4 mg Intravenous Given 12/22/18 0534)  ondansetron (ZOFRAN) injection 4 mg (4 mg Intravenous Given 12/22/18 0534)  iohexol (OMNIPAQUE) 350 MG/ML injection 100 mL (100 mLs Intravenous Contrast Given 12/22/18 0557)    ED Course  I have reviewed the triage vital signs and the nursing notes.  Pertinent labs & imaging results that were available during my care of the patient were reviewed by me and considered in my medical decision making (see chart for details).    MDM Rules/Calculators/A&P                      63 yo F with left-sided abdominal pain.  Worse in the left upper quadrant on my exam.  Very minimal.  Well-appearing nontoxic.  The family doctor saw her yesterday, was concerned for possible blood clot as the patient is on estrogen therapy.  Patient denied any chest pain or shortness of breath for me.  Denies any pleuritic symptoms as well.  D-dimer from  yesterday was elevated and so I feel somewhat compelled to obtain a CT angiogram of the chest.  Will obtain CT scan of the abdomen and pelvis as well.  CT without PE, no noted cause for pain identified in the LUQ.  Found to incidentally have lymphadenopathy, concerning  for lymphoma.  CBC without obvious issue.  Discussed with patient has had some lymph nodes come up recently.  Will treat with h2 blockers, pcp follow up.   The patients results and plan were reviewed and discussed.   Any x-rays performed were independently reviewed by myself.   Differential diagnosis were considered with the presenting HPI.  Medications  sodium chloride flush (NS) 0.9 % injection 3 mL (has no administration in time range)  morphine 4 MG/ML injection 4 mg (4 mg Intravenous Given 12/22/18 0534)  ondansetron (ZOFRAN) injection 4 mg (4 mg Intravenous Given 12/22/18 0534)  iohexol (OMNIPAQUE) 350 MG/ML injection 100 mL (100 mLs Intravenous Contrast Given 12/22/18 0557)    Vitals:   12/22/18 0338 12/22/18 0445 12/22/18 0618  BP: 128/64 117/66 133/65  Pulse: 99 94 (!) 111  Resp: 16 12 17   Temp: 98.2 F (36.8 C) 98.2 F (36.8 C)   TempSrc: Oral Oral   SpO2: 99% 96% 95%  Weight: 91.6 kg    Height: 5\' 2"  (1.575 m)      Final diagnoses:  LUQ abdominal pain  Lymphadenopathy    Admission/ observation were discussed with the admitting physician, patient and/or family and they are comfortable with the plan.   Final Clinical Impression(s) / ED Diagnoses Final diagnoses:  LUQ abdominal pain  Lymphadenopathy    Rx / DC Orders ED Discharge Orders    None       Deno Etienne, DO 12/22/18 [phone]

## 2018-12-23 ENCOUNTER — Inpatient Hospital Stay: Payer: BC Managed Care – PPO

## 2018-12-23 ENCOUNTER — Inpatient Hospital Stay: Payer: BC Managed Care – PPO | Attending: Physician Assistant | Admitting: Physician Assistant

## 2018-12-23 ENCOUNTER — Telehealth: Payer: Self-pay | Admitting: Physician Assistant

## 2018-12-23 ENCOUNTER — Other Ambulatory Visit: Payer: Self-pay

## 2018-12-23 ENCOUNTER — Encounter: Payer: Self-pay | Admitting: Physician Assistant

## 2018-12-23 ENCOUNTER — Ambulatory Visit: Payer: BC Managed Care – PPO | Admitting: Internal Medicine

## 2018-12-23 VITALS — BP 135/75 | HR 91 | Temp 98.0°F | Resp 18 | Ht 62.0 in | Wt 203.1 lb

## 2018-12-23 DIAGNOSIS — R591 Generalized enlarged lymph nodes: Secondary | ICD-10-CM

## 2018-12-23 DIAGNOSIS — M810 Age-related osteoporosis without current pathological fracture: Secondary | ICD-10-CM | POA: Diagnosis not present

## 2018-12-23 DIAGNOSIS — E559 Vitamin D deficiency, unspecified: Secondary | ICD-10-CM | POA: Diagnosis not present

## 2018-12-23 DIAGNOSIS — E039 Hypothyroidism, unspecified: Secondary | ICD-10-CM | POA: Diagnosis not present

## 2018-12-23 NOTE — Telephone Encounter (Signed)
Scheduled appt per 12/16 los.  Printed calendar and avs. 

## 2018-12-23 NOTE — Patient Instructions (Addendum)
-  We will arrange for a PET scan to be performed in the next week or so to see if there is unusual activity in the lymph nodes and to see if any other areas are involved.  -We will arrange for you have a biopsy. A biopsy is getting a small sample of tissue so that they can see if there is unusual cells in the lymph nodes such as lymphoma.  -If you do not hear from one of these departments, please call us so we can help reach out to these departments to help get you in touch with them. Our number is [phone] and ask to speak to Dr. Worthy Flank or [place].  -We will see you back for a follow up visit in 2 weeks once we have all of the results of these studies.

## 2018-12-23 NOTE — Progress Notes (Signed)
[facility] [phone]   [phone]  CONSULT NOTE  REFERRING PHYSICIAN: Liane Comber  REASON FOR CONSULTATION:  Multifocial lymphadenopathy involving the left supraclavicular, axillary, epicardial, retroperitoneal, porta hepatis and ileocolic lymph nodes.   HPI Natalie Baker is a 63 y.o. female with a past medical history significant for hypothyroidism, hysterectomy, osteoporosis, hyperlipidemia, and vitamin D deficiency is referred to the clinic for evaluation of lymphadenopathy.  The patient was seen in the emergency room on 12/22/2018 for the chief complaint of left upper quadrant pain. An etiology for her LUQ pain was unclear. However, during her evaluation with a CT of the chest, abdomen, and pelvis imaging noted multifocal mild adenopathy in the chest and abdomen including the left supraclavicular, axillary, epicardial, retroperitoneal, porta hepatis and ileocecal lymph nodes. The largest lymph node measured 19 mm in the retroperitoneum which is suspicious and a lymphoproliferative disorder such as lymphoma. Her CBC from the emergency room was unremarkable. She was then referred to the clinic for further evaluation and work-up regarding her condition.  Today, the patient is feeling well without any concerning complaints. Her LUQ discomfort is improved at this time. She notes some mild discomfort in the left upper quadrant that she describes as soreness. She denies associated symptoms such as indigestion, constipation, gas pain, injuries/traumas, etc. She denies fatigue, fever, chills, night sweats, or weight loss. She denies any recent viral infections. Her last known illness was in February 2020 in which she had an unknown upper respiratory viral infection. She denies any abnormal bleeding or bruising including epistaxis, gingival bleeding, melena, hematochezia, or hematuria.  She denies any nausea, vomiting, or constipation. She occasionally  experiences diarrhea which is not unusual for her. She denies any headache or visual changes.  She denies any rashes or skin changes.   Patient's family history significant for her mother who had CHF, diabetes mellitus, and Grave's disease. She has several siblings with hypothyroidism and diabetes mellitus. She denies any known family history for any malignancies.   The patient is married with one daughter. She used to work as a Theatre stage manager. She denies any alcohol use or drug abuse. She is a former smoker. She smoked about a 1/4 of a pack per day for approximately 10 years or so.   HPI  Past Medical History:  Diagnosis Date  . Allergy   . Colon polyp 12/29/2006  . 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 Hyperlipidemia   . Hypothyroidism   . Low kidney function   . Obesity    BMI 34  . Osteoporosis   . Palpitations   . Vitamin D deficiency     Past Surgical History:  Procedure Laterality Date  . ABDOMINAL HYSTERECTOMY  1997  . CESAREAN SECTION  1983  . COLONOSCOPY W/ BIOPSIES  12/29/2006  . LAPAROSCOPY     1991 for emdometriosis  . UPPER GASTROINTESTINAL ENDOSCOPY      Family History  Problem Relation Age of Onset  . Heart disease Mother 41       chf  . Diabetes Mother   . Thyroid disease Mother   . Cancer Mother 42       female/ skin  . Hypertension Mother   . Liver disease Mother   . Hypertension Father   . Alcohol abuse Father   . Heart disease Father   . Breast cancer Maternal Aunt   . Hyperlipidemia Maternal Aunt   . Heart disease Maternal Aunt   . Hyperlipidemia Brother   .  Hypertension Brother        x 2  . Diabetes Brother   . Hypothyroidism Brother   . Liver disease Maternal Grandmother   . Diabetes Paternal Grandmother   . Bone cancer Maternal Aunt        and skull  . Breast cancer Sister 76  . Diabetes Sister   . Hypothyroidism Sister   . Hashimoto's thyroiditis Sister   . Breast cancer Niece 81  . Colon cancer Neg Hx   . Colon polyps Neg Hx    . Esophageal cancer Neg Hx   . Rectal cancer Neg Hx   . Stomach cancer Neg Hx     Social History Social History   Tobacco Use  . Smoking status: Former Smoker    Years: 15.00    Types: Cigarettes    Quit date: 01/08/1996    Years since quitting: 22.9  . Smokeless tobacco: Never Used  . Tobacco comment: quit 1998 but is exposed to 2nd hand smoke  Substance Use Topics  . Alcohol use: No  . Drug use: No    Allergies  Allergen Reactions  . Ppd [Tuberculin Purified Protein Derivative]     Positive reaction.  Negative Chest Xray 06/16/12    Current Outpatient Medications  Medication Sig Dispense Refill  . b complex vitamins tablet Take 1 tablet by mouth daily.    . Biotin 1000 MCG CHEW Chew 5,000 mcg by mouth daily.    . cholecalciferol (VITAMIN D3) 25 MCG (1000 UT) tablet Take 10,000 Units by mouth daily.    . Coenzyme Q10 (COQ10 PO) Take 1 tablet by mouth 2 (two) times daily.    Marland Kitchen estradiol (VIVELLE-DOT) 0.05 MG/24HR patch Place 1 patch onto the skin 2 (two) times a week.    . Flaxseed, Linseed, (FLAX SEED OIL PO) Take 2 capsules by mouth 2 (two) times daily.    . meloxicam (MOBIC) 7.5 MG tablet Take 7.5 mg by mouth daily.    . metaxalone (SKELAXIN) 800 MG tablet Take 1 tablet (800 mg total) by mouth 3 (three) times daily. 30 tablet 1  . NATURE-THROID 65 MG tablet     . OVER THE COUNTER MEDICATION daily. Tumeric    . progesterone (PROMETRIUM) 100 MG capsule     . rosuvastatin (CRESTOR) 5 MG tablet Start by taking 1 tab by mouth at night once weekly; if tolerating, slowly increase frequency up to taking three nights a week (MWF). 30 tablet 1   No current facility-administered medications for this visit.    Review of Systems REVIEW OF SYSTEMS:   Review of Systems  Constitutional: Negative for appetite change, chills, fatigue, fever and unexpected weight change.  HENT:   Negative for mouth sores, nosebleeds, sore throat and trouble swallowing.   Eyes: Negative for eye  problems and icterus.  Respiratory: Negative for cough, hemoptysis, shortness of breath and wheezing.  Cardiovascular: Negative for chest pain and leg swelling.  Gastrointestinal: Positive for occasional diarrhea and LUQ discomfort. Negative for constipation, nausea and vomiting.  Genitourinary: Negative for bladder incontinence, difficulty urinating, dysuria, frequency and hematuria.   Musculoskeletal: Negative for back pain, gait problem, neck pain and neck stiffness.  Skin: Negative for itching and rash.  Neurological: Negative for dizziness, extremity weakness, gait problem, headaches, light-headedness and seizures.  Hematological: Negative for adenopathy. Does not bruise/bleed easily.  Psychiatric/Behavioral: Negative for confusion, depression and sleep disturbance. The patient is not nervous/anxious.     PHYSICAL EXAMINATION:  Blood pressure 135/75, pulse 91,  temperature 98 F (36.7 C), temperature source Temporal, resp. rate 18, height 5\' 2"  (1.575 m), weight 203 lb 1.6 oz (92.1 kg), SpO2 97 %.  ECOG PERFORMANCE STATUS: 0  Physical Exam  Constitutional: Oriented to person, place, and time and well-developed, well-nourished, and in no distress.   HENT:  Head: Normocephalic and atraumatic.  Mouth/Throat: Oropharynx is clear and moist. No oropharyngeal exudate.  Eyes: Conjunctivae are normal. Right eye exhibits no discharge. Left eye exhibits no discharge. No scleral icterus.  Neck: Normal range of motion. Neck supple.  Cardiovascular: Normal rate, regular rhythm, normal heart sounds and intact distal pulses.   Pulmonary/Chest: Effort normal and breath sounds normal. No respiratory distress. No wheezes. No rales.  Abdominal: Soft. Bowel sounds are normal. Exhibits no distension and no mass. There is no tenderness.  Musculoskeletal: Normal range of motion. Exhibits no edema.  Lymphadenopathy:    No cervical adenopathy.  Neurological: Alert and oriented to person, place, and time.  Exhibits normal muscle tone. Gait normal. Coordination normal.  Skin: Skin is warm and dry. No rash noted. Not diaphoretic. No erythema. No pallor.  Psychiatric: Mood, memory and judgment normal.  Vitals reviewed.  PERFORMANCE STATUS: ECOG 0  LABORATORY DATA: Lab Results  Component Value Date   WBC 7.7 12/22/2018   HGB 13.9 12/22/2018   HCT 44.6 12/22/2018   MCV 85.6 12/22/2018   PLT 197 12/22/2018      Chemistry      Component Value Date/Time   NA 137 12/22/2018 0345   K 3.9 12/22/2018 0345   CL 104 12/22/2018 0345   CO2 23 12/22/2018 0345   BUN 13 12/22/2018 0345   CREATININE 0.73 12/22/2018 0345   CREATININE 0.74 12/21/2018 1513      Component Value Date/Time   CALCIUM 9.2 12/22/2018 0345   ALKPHOS 70 12/22/2018 0345   AST 27 12/22/2018 0345   ALT 26 12/22/2018 0345   BILITOT 0.4 12/22/2018 0345       RADIOGRAPHIC STUDIES: CT Angio Chest PE W and/or Wo Contrast  Result Date: 12/22/2018 CLINICAL DATA:  Left upper quadrant abdominal pain. Shortness of breath. EXAM: CT ANGIOGRAPHY CHEST CT ABDOMEN AND PELVIS WITH CONTRAST TECHNIQUE: Multidetector CT imaging of the chest was performed using the standard protocol during bolus administration of intravenous contrast. Multiplanar CT image reconstructions and MIPs were obtained to evaluate the vascular anatomy. Multidetector CT imaging of the abdomen and pelvis was performed using the standard protocol during bolus administration of intravenous contrast. CONTRAST:  182mL OMNIPAQUE IOHEXOL 350 MG/ML SOLN COMPARISON:  None. FINDINGS: CTA CHEST FINDINGS Cardiovascular: There are no filling defects within the pulmonary arteries to suggest pulmonary embolus. No aortic dissection or aneurysm. Heart is normal in size. No pericardial effusion. Mediastinum/Nodes: Enlarged left supraclavicular nodes measuring up to 15 mm short axis, series 4, image 7. Prominent subcarinal node measuring 10 mm. Prominent bilateral axillary nodes measuring 10  mm on the right and 11 mm on the left. There is a prominent left epicardial node measuring 10 mm. No hilar adenopathy. Decompressed esophagus. No thyroid nodule. Prominent right epicardial fat pad. Lungs/Pleura: No acute airspace disease. No pleural fluid. No evidence pulmonary edema. Mild lower lobe bronchial thickening. Compressive atelectasis in the right lower lobe related to mild diaphragm elevation. Trachea and mainstem bronchi are patent. 3 mm pleural-based nodule in the right lower lobe series 6, image 53. Trace left pleural thickening without effusion. Musculoskeletal: Bone island in right anterior rib. There are no acute or suspicious osseous abnormalities.  Review of the MIP images confirms the above findings. CT ABDOMEN and PELVIS FINDINGS Hepatobiliary: Elongated left lobe of the liver wrapping into the left upper quadrant. No focal liver lesion. Minimal high density within the gallbladder neck may be stones or sludge. No pericholecystic inflammation. No biliary dilatation. Pancreas: No ductal dilatation or inflammation. Spleen: Normal in size, greatest splenic dimension 11.8 cm AP. No focal abnormality. Adrenals/Urinary Tract: No adrenal nodule. No hydronephrosis or perinephric edema. Homogeneous renal enhancement with symmetric excretion on delayed phase imaging. Subcentimeter low-density in the lower left kidney is too small to characterize but likely small cyst. Additional parapelvic cysts in the lower left kidney. Urinary bladder is nondistended. Stomach/Bowel: Nondistended stomach without gastric wall thickening. No small bowel obstruction or inflammation. Normal appendix, series 4, image 43 moderate volume of stool in the proximal colon. Small volume of stool distally. Vascular/Lymphatic: Retroperitoneal adenopathy with left periaortic node measuring 17 mm short axis, series 2, image 30 just below the left renal vein. Possible additional lymph node anterior left periaortic station measuring 19 mm,  however difficult to differentiate from adjacent small bowel. Multiple additional prominent retroperitoneal nodes extend from the SMA to the iliac bifurcation. Porta hepatis adenopathy with enlarged node measuring 14 mm. Portal caval node measures 12 mm. Enlarged ileocolic nodes, largest measuring 19 mm, series 2, image 53. Additional prominent central mesenteric lymph nodes. Increased number of prominent bilateral external iliac nodes. No inguinal adenopathy. Mild aortic atherosclerosis. No aneurysm. Patent portal vein. Reproductive: Status post hysterectomy. No adnexal masses. Other: No ascites. No free air. Musculoskeletal: There are no acute or suspicious osseous abnormalities. Review of the MIP images confirms the above findings. IMPRESSION: 1. No pulmonary embolus or acute intrathoracic abnormality. 2. Multifocal mild adenopathy in the chest and abdomen, including left supraclavicular, axillary, epicardial, retroperitoneal, porta hepatis and ileocolic. Largest lymph node measures 19 mm greatest dimension in the retroperitoneum. Overall appearance is suspicious for lymphoproliferative disorder such as lymphoma. Recommend clinical/laboratory evaluation, consider oncologic referral. Metastatic disease is also considered, however multifocal nature argues against this. 3. Possible gallstone or sludge in the gallbladder. Aortic Atherosclerosis (ICD10-I70.0). Electronically Signed   By: Keith Rake M.D.   On: 12/22/2018 06:37   CT ABDOMEN PELVIS W CONTRAST  Result Date: 12/22/2018 CLINICAL DATA:  Left upper quadrant abdominal pain. Shortness of breath. EXAM: CT ANGIOGRAPHY CHEST CT ABDOMEN AND PELVIS WITH CONTRAST TECHNIQUE: Multidetector CT imaging of the chest was performed using the standard protocol during bolus administration of intravenous contrast. Multiplanar CT image reconstructions and MIPs were obtained to evaluate the vascular anatomy. Multidetector CT imaging of the abdomen and pelvis was  performed using the standard protocol during bolus administration of intravenous contrast. CONTRAST:  170mL OMNIPAQUE IOHEXOL 350 MG/ML SOLN COMPARISON:  None. FINDINGS: CTA CHEST FINDINGS Cardiovascular: There are no filling defects within the pulmonary arteries to suggest pulmonary embolus. No aortic dissection or aneurysm. Heart is normal in size. No pericardial effusion. Mediastinum/Nodes: Enlarged left supraclavicular nodes measuring up to 15 mm short axis, series 4, image 7. Prominent subcarinal node measuring 10 mm. Prominent bilateral axillary nodes measuring 10 mm on the right and 11 mm on the left. There is a prominent left epicardial node measuring 10 mm. No hilar adenopathy. Decompressed esophagus. No thyroid nodule. Prominent right epicardial fat pad. Lungs/Pleura: No acute airspace disease. No pleural fluid. No evidence pulmonary edema. Mild lower lobe bronchial thickening. Compressive atelectasis in the right lower lobe related to mild diaphragm elevation. Trachea and mainstem bronchi are patent. 3  mm pleural-based nodule in the right lower lobe series 6, image 53. Trace left pleural thickening without effusion. Musculoskeletal: Bone island in right anterior rib. There are no acute or suspicious osseous abnormalities. Review of the MIP images confirms the above findings. CT ABDOMEN and PELVIS FINDINGS Hepatobiliary: Elongated left lobe of the liver wrapping into the left upper quadrant. No focal liver lesion. Minimal high density within the gallbladder neck may be stones or sludge. No pericholecystic inflammation. No biliary dilatation. Pancreas: No ductal dilatation or inflammation. Spleen: Normal in size, greatest splenic dimension 11.8 cm AP. No focal abnormality. Adrenals/Urinary Tract: No adrenal nodule. No hydronephrosis or perinephric edema. Homogeneous renal enhancement with symmetric excretion on delayed phase imaging. Subcentimeter low-density in the lower left kidney is too small to  characterize but likely small cyst. Additional parapelvic cysts in the lower left kidney. Urinary bladder is nondistended. Stomach/Bowel: Nondistended stomach without gastric wall thickening. No small bowel obstruction or inflammation. Normal appendix, series 4, image 43 moderate volume of stool in the proximal colon. Small volume of stool distally. Vascular/Lymphatic: Retroperitoneal adenopathy with left periaortic node measuring 17 mm short axis, series 2, image 30 just below the left renal vein. Possible additional lymph node anterior left periaortic station measuring 19 mm, however difficult to differentiate from adjacent small bowel. Multiple additional prominent retroperitoneal nodes extend from the SMA to the iliac bifurcation. Porta hepatis adenopathy with enlarged node measuring 14 mm. Portal caval node measures 12 mm. Enlarged ileocolic nodes, largest measuring 19 mm, series 2, image 53. Additional prominent central mesenteric lymph nodes. Increased number of prominent bilateral external iliac nodes. No inguinal adenopathy. Mild aortic atherosclerosis. No aneurysm. Patent portal vein. Reproductive: Status post hysterectomy. No adnexal masses. Other: No ascites. No free air. Musculoskeletal: There are no acute or suspicious osseous abnormalities. Review of the MIP images confirms the above findings. IMPRESSION: 1. No pulmonary embolus or acute intrathoracic abnormality. 2. Multifocal mild adenopathy in the chest and abdomen, including left supraclavicular, axillary, epicardial, retroperitoneal, porta hepatis and ileocolic. Largest lymph node measures 19 mm greatest dimension in the retroperitoneum. Overall appearance is suspicious for lymphoproliferative disorder such as lymphoma. Recommend clinical/laboratory evaluation, consider oncologic referral. Metastatic disease is also considered, however multifocal nature argues against this. 3. Possible gallstone or sludge in the gallbladder. Aortic  Atherosclerosis (ICD10-I70.0). Electronically Signed   By: Keith Rake M.D.   On: 12/22/2018 06:37    ASSESSMENT: This is a very pleasant 63 year old Caucasian female is referred to the clinic for multifocal mild adenopathy in the chest and abdomen including left supraclavicular, axillary, epicardial, retroperitoneal, porta hepatis and ileocecal lymph nodes which is suspicious for a low grade lymphoma.    PLAN: The patient was seen with Dr. Julien Nordmann today.  Dr. Julien Nordmann recommends further evaluation of her condition. We will arrange for the patient to have an ultrasound-guided core biopsy of the left supraclavicular lymph node. We will also arrange for the patient to have a PET scan performed.   We will see the patient back for a follow up visit in 2 weeks once we have the results of these studies.   For her LUQ pain, the patient will continue to use tylenol, a heating pad, and OTC salonpas patches. She was advised to avoid NSAIDs.   The patient voices understanding of current disease status and treatment options and is in agreement with the current care plan.  All questions were answered. The patient knows to call the clinic with any problems, questions or concerns. We can  certainly see the patient much sooner if necessary.  Thank you so much for allowing me to participate in the care of Natalie Baker. I will continue to follow up the patient with you and assist in her care.  I spent 40 minutes counseling the patient face to face. The total time spent in the appointment was 60 minutes.  Disclaimer: This note was dictated with voice recognition software. Similar sounding words can inadvertently be transcribed and may not be corrected upon review.   Natalie Baker December 23, 2018, 12:48 PM  ADDENDUM: Hematology/Oncology Attending: I had a face-to-face encounter with the patient today.  I recommended her care plan.  Her daughter Natalie Baker was available by phone during the  visit.  The patient is a 63 years old white female who presented to the emergency department complaining of left upper quadrant abdominal pain that has been going on for few days.  During her evaluation she had CT angiogram of the chest as well as CT scan of the abdomen and pelvis on December 22, 2018 and it showed no evidence for pulmonary embolism or acute intrathoracic abnormalities but there was multifocal mild adenopathy in the chest and abdomen including left supraclavicular, axillary, epicardial, retroperitoneal, porta hepatis as well as ileocolic.  The largest lymph node measured 1.9 cm in greatest dimension in the retroperitoneum.  The overall appearance was suspicious for lymphoproliferative disorder such as lymphoma. The patient was referred to me today for evaluation and recommendation regarding her condition.  There was no clear explanation for her left upper quadrant pain. When seen today the patient is feeling fine except for mild left upper quadrant pain. She denied having any recent weight loss or night sweats.  She denied having any chest pain, shortness of breath, cough or hemoptysis.  She denied having any bleeding, bruises or ecchymosis. I had a lengthy discussion with the patient and her daughter about her condition and further recommendation and investigation to confirm her diagnosis. I recommended for the patient to have a PET scan performed for further evaluation of the lymphadenopathy and to rule out lymphoma.  I will also arrange for the patient to have ultrasound-guided core biopsy of the left supraclavicular lymphadenopathy for tissue diagnosis. We will arrange for the patient to come back for follow-up visit in around 2 weeks for evaluation and discussion of her scan and biopsy results as well as recommendation regarding management of her condition. The patient was advised to call immediately if she has any concerning symptoms in the interval.  The patient and her daughter agreed  to the current plan.  Disclaimer: This note was dictated with voice recognition software. Similar sounding words can inadvertently be transcribed and may be missed upon review. Eilleen Kempf, MD 12/23/18

## 2018-12-24 ENCOUNTER — Telehealth (HOSPITAL_COMMUNITY): Payer: Self-pay

## 2018-12-29 ENCOUNTER — Ambulatory Visit (HOSPITAL_COMMUNITY)
Admission: RE | Admit: 2018-12-29 | Discharge: 2018-12-29 | Disposition: A | Discharge status: Home / Self Care | Payer: BC Managed Care – PPO | Source: Ambulatory Visit | Attending: Physician Assistant | Admitting: Physician Assistant

## 2018-12-29 ENCOUNTER — Other Ambulatory Visit: Payer: Self-pay

## 2018-12-29 ENCOUNTER — Other Ambulatory Visit: Payer: Self-pay | Admitting: Physician Assistant

## 2018-12-29 ENCOUNTER — Telehealth: Payer: Self-pay | Admitting: Physician Assistant

## 2018-12-29 ENCOUNTER — Other Ambulatory Visit: Payer: BLUE CROSS/BLUE SHIELD

## 2018-12-29 DIAGNOSIS — R591 Generalized enlarged lymph nodes: Secondary | ICD-10-CM

## 2018-12-29 DIAGNOSIS — R599 Enlarged lymph nodes, unspecified: Secondary | ICD-10-CM | POA: Diagnosis not present

## 2018-12-29 LAB — GLUCOSE, CAPILLARY: Glucose-Capillary: 102 mg/dL — ABNORMAL HIGH (ref 70–99)

## 2018-12-29 MED ORDER — FLUDEOXYGLUCOSE F - 18 (FDG) INJECTION
10.0000 | Freq: Once | INTRAVENOUS | Status: AC | PRN
  Administered 2018-12-29: 07:00:00 10 via INTRAVENOUS

## 2018-12-29 NOTE — Telephone Encounter (Signed)
Spoke to the patient about a change of plans. I have placed a CT guided biopsy to be performed rather than the ultrasound guided biopsy of the supraclavicular lymph node based on her PET scan. Told the patient to be on the look out for a phone call from radiology to schedule this procedure. She expressed understanding.

## 2018-12-30 ENCOUNTER — Telehealth: Payer: Self-pay | Admitting: *Deleted

## 2018-12-30 NOTE — Telephone Encounter (Signed)
Received call from pt stating that she is having pain in the stomach region for more than 1 wk & she was told to take tylenol. She is taking 1000 mg twice daily which helps some.  Pain is # 8 now but was # 10 last hs.  She says tylenol helps some & may get down to # 3-4 but never goes away.  She also has a muscle relaxer.  Mobic is on her med list but she states that it has expired & she was told not to take ibuprofen due to her kidney function.  Please return call to her cell #  Message routed to Dr Mohamen/Pod RN

## 2018-12-30 NOTE — Telephone Encounter (Signed)
Pt notified and instructed not to exceed 6 EXtylenol in 24 hours as needed. PT voiced understanding.

## 2018-12-30 NOTE — Telephone Encounter (Signed)
No.  Continue Tylenol on as needed.  There was no concerning findings on her PET scan to explain her abdominal pain.  Thank you.

## 2018-12-31 ENCOUNTER — Telehealth: Payer: Self-pay | Admitting: Physician Assistant

## 2018-12-31 NOTE — Telephone Encounter (Signed)
Spoke to the patient about her appointment that is scheduled for 12/29. Since her biopsy will not be performed until 12/30, I will reschedule her office visit to ~01/12/2019. Told her to be on the look out from a call from the scheduling department regarding the exact time of her appointment. She expressed understanding.

## 2018-12-31 NOTE — Progress Notes (Signed)
Maraya L. Dunkley Female, 63 y.o., March 12, 1955 MRN:  [id number] Phone:  [phone] Jerilynn Mages) PCP:  [name], MD Coverage:  Sherre Poot [employer] Ppo Next Appt With Radiology ([facility] 1) 01/06/2019 at 9:00 AM  RE: Biopsy Received: Yesterday Message Contents  Markus Daft, MD  Lenore Cordia      Ok to schedule CT guided biopsy of left external iliac lymph node.   [place]   Previous Messages  ----- Message -----  From: Lenore Cordia  Sent: 12/30/2018 12:58 PM EST  To: [facility] Procedure Requests  Subject: Biopsy                      Procedure Requested: CT Biopsy    Reason for Procedure:  hypermetabolic external iliac lymph node or most accessible    Provider Requesting: Heilingoetter, Cassandra L  Provider Telephone: [phone]    Other Info: Rad exams in Epic

## 2018-12-31 NOTE — Telephone Encounter (Signed)
Scheduled apt per 12/24 sch msg - pt aware of appt date and time

## 2019-01-05 ENCOUNTER — Other Ambulatory Visit: Payer: BC Managed Care – PPO

## 2019-01-05 ENCOUNTER — Ambulatory Visit: Payer: BC Managed Care – PPO | Admitting: Physician Assistant

## 2019-01-05 ENCOUNTER — Other Ambulatory Visit: Payer: Self-pay | Admitting: Radiology

## 2019-01-06 ENCOUNTER — Other Ambulatory Visit: Payer: Self-pay

## 2019-01-06 ENCOUNTER — Encounter (HOSPITAL_COMMUNITY): Payer: Self-pay

## 2019-01-06 ENCOUNTER — Ambulatory Visit (HOSPITAL_COMMUNITY)
Admission: RE | Admit: 2019-01-06 | Discharge: 2019-01-06 | Disposition: A | Discharge status: Home / Self Care | Payer: BC Managed Care – PPO | Source: Ambulatory Visit | Attending: Physician Assistant | Admitting: Physician Assistant

## 2019-01-06 DIAGNOSIS — R591 Generalized enlarged lymph nodes: Secondary | ICD-10-CM

## 2019-01-06 DIAGNOSIS — C8316 Mantle cell lymphoma, intrapelvic lymph nodes: Secondary | ICD-10-CM | POA: Diagnosis not present

## 2019-01-06 DIAGNOSIS — R590 Localized enlarged lymph nodes: Secondary | ICD-10-CM | POA: Diagnosis not present

## 2019-01-06 LAB — CBC WITH DIFFERENTIAL/PLATELET
Abs Immature Granulocytes: 0.02 10*3/uL (ref 0.00–0.07)
Basophils Absolute: 0 10*3/uL (ref 0.0–0.1)
Basophils Relative: 1 %
Eosinophils Absolute: 0.1 10*3/uL (ref 0.0–0.5)
Eosinophils Relative: 2 %
HCT: 44.4 % (ref 36.0–46.0)
Hemoglobin: 14 g/dL (ref 12.0–15.0)
Immature Granulocytes: 0 %
Lymphocytes Relative: 34 %
Lymphs Abs: 2.4 10*3/uL (ref 0.7–4.0)
MCH: 26.8 pg (ref 26.0–34.0)
MCHC: 31.5 g/dL (ref 30.0–36.0)
MCV: 85.1 fL (ref 80.0–100.0)
Monocytes Absolute: 0.6 10*3/uL (ref 0.1–1.0)
Monocytes Relative: 8 %
Neutro Abs: 3.9 10*3/uL (ref 1.7–7.7)
Neutrophils Relative %: 55 %
Platelets: 201 10*3/uL (ref 150–400)
RBC: 5.22 MIL/uL — ABNORMAL HIGH (ref 3.87–5.11)
RDW: 14.4 % (ref 11.5–15.5)
WBC: 7 10*3/uL (ref 4.0–10.5)
nRBC: 0 % (ref 0.0–0.2)

## 2019-01-06 LAB — PROTIME-INR
INR: 0.9 (ref 0.8–1.2)
Prothrombin Time: 12.2 seconds (ref 11.4–15.2)

## 2019-01-06 MED ORDER — ONDANSETRON HCL 4 MG/2ML IJ SOLN
INTRAMUSCULAR | Status: AC | PRN
  Administered 2019-01-06: 4 mg via INTRAVENOUS

## 2019-01-06 MED ORDER — MIDAZOLAM HCL 2 MG/2ML IJ SOLN
INTRAMUSCULAR | Status: AC
  Filled 2019-01-06: qty 4

## 2019-01-06 MED ORDER — MIDAZOLAM HCL 2 MG/2ML IJ SOLN
INTRAMUSCULAR | Status: AC | PRN
  Administered 2019-01-06 (×2): 1 mg via INTRAVENOUS

## 2019-01-06 MED ORDER — HYDROCODONE-ACETAMINOPHEN 5-325 MG PO TABS: 1.0000 | ORAL_TABLET | ORAL | Status: DC | PRN

## 2019-01-06 MED ORDER — SODIUM CHLORIDE 0.9 % IV SOLN: INTRAVENOUS | Status: DC

## 2019-01-06 MED ORDER — ONDANSETRON HCL 4 MG/2ML IJ SOLN
INTRAMUSCULAR | Status: AC
  Filled 2019-01-06: qty 2

## 2019-01-06 MED ORDER — PROMETHAZINE HCL 25 MG/ML IJ SOLN
12.5000 mg | INTRAMUSCULAR | Status: AC
  Administered 2019-01-06: 12.5 mg via INTRAVENOUS
  Filled 2019-01-06: qty 1

## 2019-01-06 MED ORDER — LIDOCAINE HCL (PF) 1 % IJ SOLN
INTRAMUSCULAR | Status: AC | PRN
  Administered 2019-01-06: 10 mL

## 2019-01-06 MED ORDER — FENTANYL CITRATE (PF) 100 MCG/2ML IJ SOLN
INTRAMUSCULAR | Status: AC | PRN
  Administered 2019-01-06 (×2): 50 ug via INTRAVENOUS

## 2019-01-06 MED ORDER — FENTANYL CITRATE (PF) 100 MCG/2ML IJ SOLN
INTRAMUSCULAR | Status: AC
  Filled 2019-01-06: qty 2

## 2019-01-06 NOTE — Progress Notes (Signed)
Patient states nausea getting worse.  Rowe Robert, PA with IR notified.  Order given for 12.5mg  Phenergan IV.

## 2019-01-06 NOTE — Progress Notes (Signed)
Patient states nausea resolved.

## 2019-01-06 NOTE — Procedures (Signed)
  Procedure: CT core L ext iliac LAN   EBL:   minimal Complications:  none immediate  See full dictation in [facility].  Dillard Cannon MD Main # [phone] Pager  [phone]

## 2019-01-06 NOTE — Consult Note (Signed)
Chief Complaint: Patient was seen in consultation today for image guided left external iliac lymph node biopsy   Referring Physician(s): Heilingoetter,Cassandra L/Mohamed,M  Supervising Physician: Arne Cleveland  Patient Status: [facility] - Out-pt  History of Present Illness: Natalie Baker is a 63 y.o. female with past medical history significant for hypothyroidism, hysterectomy, osteoporosis, hyperlipidemia and vitamin D deficiency.  She was seen recently in the emergency room on 12/22/2018 for left upper abdominal pain.  Subsequent CT revealed multifocal mild adenopathy in the chest and abdomen.  PET scan on 12/29/2018 revealed:  1. Hypermetabolic retroperitoneal and LEFT iliac lymphadenopathy consistent with lymphoma. 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 Activity is slightly above liver activity. Granulomatous disease could also have this pattern but lymphoproliferative disease favored. 2. Mild activity associated with prominent LEFT axillary lymph nodes may also represent lymphoma. 3. Focal activity along the inferior margin of the spleen is indeterminate. No lesion seen on comparison contrast CT 4. No evidence of marrow involvement with lymphoproliferative process. 5. RIGHT middle lobe pulmonary nodule. Recommend attention on follow-up. 6. Fortissue sampling planning, the most assessable lymph node may be the LEFT external iliac node. LEFT axillary nodes are assessable but metabolic activity is significantly less.  She has no prior history of cancer and presents today for image guided left external iliac lymph node biopsy for further evaluation.   Past Medical History:  Diagnosis Date  . Allergy   . Colon polyp 12/29/2006  . 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 Hyperlipidemia   . Hypothyroidism   . Low kidney function   . Obesity    BMI 34  . Osteoporosis   . Palpitations   . Vitamin D deficiency     Past Surgical History:  Procedure  Laterality Date  . ABDOMINAL HYSTERECTOMY  1997  . CESAREAN SECTION  1983  . COLONOSCOPY W/ BIOPSIES  12/29/2006  . LAPAROSCOPY     1991 for emdometriosis  . UPPER GASTROINTESTINAL ENDOSCOPY      Allergies: Ppd [tuberculin purified protein derivative]  Medications: Prior to Admission medications   Medication Sig Start Date End Date Taking? Authorizing Provider  metaxalone (SKELAXIN) 800 MG tablet Take 1 tablet (800 mg total) by mouth 3 (three) times daily. 08/22/15  Yes Rolene Course, PA-C  NATURE-THROID 65 MG tablet  02/22/18  Yes [provider]  OVER THE COUNTER MEDICATION daily. Tumeric   Yes [provider]  progesterone (PROMETRIUM) 100 MG capsule  01/19/18  Yes [provider]  b complex vitamins tablet Take 1 tablet by mouth daily.    [provider]  Biotin 1000 MCG CHEW Chew 5,000 mcg by mouth daily.    [provider]  cholecalciferol (VITAMIN D3) 25 MCG (1000 UT) tablet Take 10,000 Units by mouth daily.    [provider]  Coenzyme Q10 (COQ10 PO) Take 1 tablet by mouth 2 (two) times daily.    [provider]  estradiol (VIVELLE-DOT) 0.05 MG/24HR patch Place 1 patch onto the skin 2 (two) times a week.    [provider]  Flaxseed, Linseed, (FLAX SEED OIL PO) Take 2 capsules by mouth 2 (two) times daily.    [provider]  meloxicam (MOBIC) 7.5 MG tablet Take 7.5 mg by mouth daily.    [provider]  rosuvastatin (CRESTOR) 5 MG tablet Start by taking 1 tab by mouth at night once weekly; if tolerating, slowly increase frequency up to taking three nights a week (MWF). 08/19/18   Liane Comber, NP  Family History  Problem Relation Age of Onset  . Heart disease Mother 8       chf  . Diabetes Mother   . Thyroid disease Mother   . Cancer Mother 45       female/ skin  . Hypertension Mother   . Liver disease Mother   . Hypertension Father   . Alcohol abuse Father   . Heart  disease Father   . Breast cancer Maternal Aunt   . Hyperlipidemia Maternal Aunt   . Heart disease Maternal Aunt   . Hyperlipidemia Brother   . Hypertension Brother        x 2  . Diabetes Brother   . Hypothyroidism Brother   . Liver disease Maternal Grandmother   . Diabetes Paternal Grandmother   . Bone cancer Maternal Aunt        and skull  . Breast cancer Sister 1  . Diabetes Sister   . Hypothyroidism Sister   . Hashimoto's thyroiditis Sister   . Breast cancer Niece 65  . Colon cancer Neg Hx   . Colon polyps Neg Hx   . Esophageal cancer Neg Hx   . Rectal cancer Neg Hx   . Stomach cancer Neg Hx     Social History   Socioeconomic History  . Marital status: Married    Spouse name: Not on file  . Number of children: 1  . Years of education: Not on file  . Highest education level: Not on file  Occupational History  . Occupation: Theatre manager  Tobacco Use  . Smoking status: Former Smoker    Years: 15.00    Types: Cigarettes    Quit date: 01/08/1996    Years since quitting: 23.0  . Smokeless tobacco: Never Used  . Tobacco comment: quit 1998 but is exposed to 2nd hand smoke  Substance and Sexual Activity  . Alcohol use: No  . Drug use: No  . Sexual activity: Yes    Partners: Male    Birth control/protection: Post-menopausal  Other Topics Concern  . 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 Difficulty of Paying Living Expenses: Not on file  Food Insecurity:   . Worried About Charity fundraiser in the Last Year: Not on file  . Ran Out of Food in the Last Year: Not on file  Transportation Needs:   . Lack of Transportation (Medical): Not on file  . Lack of Transportation (Non-Medical): Not on file  Physical Activity: Inactive  . Days of Exercise per Week: 0 days  . Minutes of Exercise per Session: 0 min  Stress:  No Stress Concern Present  . Feeling of Stress : Only a little  Social Connections:   . Frequency of Communication with Friends and Family: Not on file  . Frequency of Social Gatherings with Friends and Family: Not on file  . Attends Religious Services: Not on file  . Active Member of Clubs or Organizations: Not on file  . Attends Archivist Meetings: Not on file  . Marital Status: Not on file      Review of Systems denies fever, headache, chest pain, dyspnea, cough, back pain, nausea, vomiting or bleeding.  She does have left upper abdominal discomfort.  Vital Signs: BP (!) 151/81   Pulse 98   Temp 97.9 F (36.6 C) (Oral)  Resp 18   SpO2 99%   Physical Exam awake, alert.  Chest clear to auscultation bilaterally.  Heart with regular rate and rhythm.  Abdomen soft, positive bowel sounds, mild left upper abdominal tenderness to palpation.  No significant lower extremity edema.  Imaging: CT Angio Chest PE W and/or Wo Contrast  Result Date: 12/22/2018 CLINICAL DATA:  Left upper quadrant abdominal pain. Shortness of breath. EXAM: CT ANGIOGRAPHY CHEST CT ABDOMEN AND PELVIS WITH CONTRAST TECHNIQUE: Multidetector CT imaging of the chest was performed using the standard protocol during bolus administration of intravenous contrast. Multiplanar CT image reconstructions and MIPs were obtained to evaluate the vascular anatomy. Multidetector CT imaging of the abdomen and pelvis was performed using the standard protocol during bolus administration of intravenous contrast. CONTRAST:  175mL OMNIPAQUE IOHEXOL 350 MG/ML SOLN COMPARISON:  None. FINDINGS: CTA CHEST FINDINGS Cardiovascular: There are no filling defects within the pulmonary arteries to suggest pulmonary embolus. No aortic dissection or aneurysm. Heart is normal in size. No pericardial effusion. Mediastinum/Nodes: Enlarged left supraclavicular nodes measuring up to 15 mm short axis, series 4, image 7. Prominent subcarinal node  measuring 10 mm. Prominent bilateral axillary nodes measuring 10 mm on the right and 11 mm on the left. There is a prominent left epicardial node measuring 10 mm. No hilar adenopathy. Decompressed esophagus. No thyroid nodule. Prominent right epicardial fat pad. Lungs/Pleura: No acute airspace disease. No pleural fluid. No evidence pulmonary edema. Mild lower lobe bronchial thickening. Compressive atelectasis in the right lower lobe related to mild diaphragm elevation. Trachea and mainstem bronchi are patent. 3 mm pleural-based nodule in the right lower lobe series 6, image 53. Trace left pleural thickening without effusion. Musculoskeletal: Bone island in right anterior rib. There are no acute or suspicious osseous abnormalities. Review of the MIP images confirms the above findings. CT ABDOMEN and PELVIS FINDINGS Hepatobiliary: Elongated left lobe of the liver wrapping into the left upper quadrant. No focal liver lesion. Minimal high density within the gallbladder neck may be stones or sludge. No pericholecystic inflammation. No biliary dilatation. Pancreas: No ductal dilatation or inflammation. Spleen: Normal in size, greatest splenic dimension 11.8 cm AP. No focal abnormality. Adrenals/Urinary Tract: No adrenal nodule. No hydronephrosis or perinephric edema. Homogeneous renal enhancement with symmetric excretion on delayed phase imaging. Subcentimeter low-density in the lower left kidney is too small to characterize but likely small cyst. Additional parapelvic cysts in the lower left kidney. Urinary bladder is nondistended. Stomach/Bowel: Nondistended stomach without gastric wall thickening. No small bowel obstruction or inflammation. Normal appendix, series 4, image 43 moderate volume of stool in the proximal colon. Small volume of stool distally. Vascular/Lymphatic: Retroperitoneal adenopathy with left periaortic node measuring 17 mm short axis, series 2, image 30 just below the left renal vein. Possible  additional lymph node anterior left periaortic station measuring 19 mm, however difficult to differentiate from adjacent small bowel. Multiple additional prominent retroperitoneal nodes extend from the SMA to the iliac bifurcation. Porta hepatis adenopathy with enlarged node measuring 14 mm. Portal caval node measures 12 mm. Enlarged ileocolic nodes, largest measuring 19 mm, series 2, image 53. Additional prominent central mesenteric lymph nodes. Increased number of prominent bilateral external iliac nodes. No inguinal adenopathy. Mild aortic atherosclerosis. No aneurysm. Patent portal vein. Reproductive: Status post hysterectomy. No adnexal masses. Other: No ascites. No free air. Musculoskeletal: There are no acute or suspicious osseous abnormalities. Review of the MIP images confirms the above findings. IMPRESSION: 1. No pulmonary embolus or acute intrathoracic abnormality. 2. Multifocal  mild adenopathy in the chest and abdomen, including left supraclavicular, axillary, epicardial, retroperitoneal, porta hepatis and ileocolic. Largest lymph node measures 19 mm greatest dimension in the retroperitoneum. Overall appearance is suspicious for lymphoproliferative disorder such as lymphoma. Recommend clinical/laboratory evaluation, consider oncologic referral. Metastatic disease is also considered, however multifocal nature argues against this. 3. Possible gallstone or sludge in the gallbladder. Aortic Atherosclerosis (ICD10-I70.0). Electronically Signed   By: Keith Rake M.D.   On: 12/22/2018 06:37   CT ABDOMEN PELVIS W CONTRAST  Result Date: 12/22/2018 CLINICAL DATA:  Left upper quadrant abdominal pain. Shortness of breath. EXAM: CT ANGIOGRAPHY CHEST CT ABDOMEN AND PELVIS WITH CONTRAST TECHNIQUE: Multidetector CT imaging of the chest was performed using the standard protocol during bolus administration of intravenous contrast. Multiplanar CT image reconstructions and MIPs were obtained to evaluate the  vascular anatomy. Multidetector CT imaging of the abdomen and pelvis was performed using the standard protocol during bolus administration of intravenous contrast. CONTRAST:  155mL OMNIPAQUE IOHEXOL 350 MG/ML SOLN COMPARISON:  None. FINDINGS: CTA CHEST FINDINGS Cardiovascular: There are no filling defects within the pulmonary arteries to suggest pulmonary embolus. No aortic dissection or aneurysm. Heart is normal in size. No pericardial effusion. Mediastinum/Nodes: Enlarged left supraclavicular nodes measuring up to 15 mm short axis, series 4, image 7. Prominent subcarinal node measuring 10 mm. Prominent bilateral axillary nodes measuring 10 mm on the right and 11 mm on the left. There is a prominent left epicardial node measuring 10 mm. No hilar adenopathy. Decompressed esophagus. No thyroid nodule. Prominent right epicardial fat pad. Lungs/Pleura: No acute airspace disease. No pleural fluid. No evidence pulmonary edema. Mild lower lobe bronchial thickening. Compressive atelectasis in the right lower lobe related to mild diaphragm elevation. Trachea and mainstem bronchi are patent. 3 mm pleural-based nodule in the right lower lobe series 6, image 53. Trace left pleural thickening without effusion. Musculoskeletal: Bone island in right anterior rib. There are no acute or suspicious osseous abnormalities. Review of the MIP images confirms the above findings. CT ABDOMEN and PELVIS FINDINGS Hepatobiliary: Elongated left lobe of the liver wrapping into the left upper quadrant. No focal liver lesion. Minimal high density within the gallbladder neck may be stones or sludge. No pericholecystic inflammation. No biliary dilatation. Pancreas: No ductal dilatation or inflammation. Spleen: Normal in size, greatest splenic dimension 11.8 cm AP. No focal abnormality. Adrenals/Urinary Tract: No adrenal nodule. No hydronephrosis or perinephric edema. Homogeneous renal enhancement with symmetric excretion on delayed phase imaging.  Subcentimeter low-density in the lower left kidney is too small to characterize but likely small cyst. Additional parapelvic cysts in the lower left kidney. Urinary bladder is nondistended. Stomach/Bowel: Nondistended stomach without gastric wall thickening. No small bowel obstruction or inflammation. Normal appendix, series 4, image 43 moderate volume of stool in the proximal colon. Small volume of stool distally. Vascular/Lymphatic: Retroperitoneal adenopathy with left periaortic node measuring 17 mm short axis, series 2, image 30 just below the left renal vein. Possible additional lymph node anterior left periaortic station measuring 19 mm, however difficult to differentiate from adjacent small bowel. Multiple additional prominent retroperitoneal nodes extend from the SMA to the iliac bifurcation. Porta hepatis adenopathy with enlarged node measuring 14 mm. Portal caval node measures 12 mm. Enlarged ileocolic nodes, largest measuring 19 mm, series 2, image 53. Additional prominent central mesenteric lymph nodes. Increased number of prominent bilateral external iliac nodes. No inguinal adenopathy. Mild aortic atherosclerosis. No aneurysm. Patent portal vein. Reproductive: Status post hysterectomy. No adnexal masses. Other: No  ascites. No free air. Musculoskeletal: There are no acute or suspicious osseous abnormalities. Review of the MIP images confirms the above findings. IMPRESSION: 1. No pulmonary embolus or acute intrathoracic abnormality. 2. Multifocal mild adenopathy in the chest and abdomen, including left supraclavicular, axillary, epicardial, retroperitoneal, porta hepatis and ileocolic. Largest lymph node measures 19 mm greatest dimension in the retroperitoneum. Overall appearance is suspicious for lymphoproliferative disorder such as lymphoma. Recommend clinical/laboratory evaluation, consider oncologic referral. Metastatic disease is also considered, however multifocal nature argues against this. 3.  Possible gallstone or sludge in the gallbladder. Aortic Atherosclerosis (ICD10-I70.0). Electronically Signed   By: Keith Rake M.D.   On: 12/22/2018 06:37   NM PET Image Restag (PS) Skull Base To Thigh  Result Date: 12/29/2018 CLINICAL DATA:  Initial treatment strategy for lymphadenopathy. EXAM: NUCLEAR MEDICINE PET SKULL BASE TO THIGH TECHNIQUE: 10.0 mCi F-18 FDG was injected intravenously. Full-ring PET imaging was performed from the skull base to thigh after the radiotracer. CT data was obtained and used for attenuation correction and anatomic localization. Fasting blood glucose: 102 mg/dl COMPARISON:  CT scan 12/22/2018 FINDINGS: Mediastinal blood pool activity: SUV max 2.9 Liver activity: SUV max 4.2 NECK: No hypermetabolic lymph nodes in the neck. Incidental CT findings: none CHEST: Mild metabolic activity within the axillary lymph nodes similar to background blood pool activity and less than liver activity. Example LEFT axillary node measures 9 mm short axis with SUV max 3.0 No hypermetabolic mediastinal nodes.  No enlarged mediastinal nodes Nodule in the RIGHT middle lobe measures 6 mm (68/3). No associated metabolic activity. There is atelectasis in the more peripheral RIGHT middle lobe also without metabolic Incidental CT findings: none ABDOMEN/PELVIS: enlarged retroperitoneal lymph node adjacent to the SMA measures 20 mm short axis and with moderate metabolic activity (SUV max equal 5.0) Enlarged LEFTinternal iliac lymph node measures 12 mm (image 133/3) with (SUV max equal 5.5) There is enlarged LEFT external iliac lymph node measuring 19 mm on image 153/3 with SUV max equal 5.1. No enlarged hypermetabolic inguinal nodes. No central mesenteric nodes. There is a focus of metabolic activity along the inferior margin of the spleen SUV max equal 5.5 (image 107 fused data set.) No clear splenic lesion associated with this activity. Incidental CT findings: SKELETON: No focal hypermetabolic activity  to suggest skeletal metastasis. Incidental CT findings: none IMPRESSION: 1. Hypermetabolic retroperitoneal and LEFT iliac lymphadenopathy consistent with lymphoma. 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 Activity is slightly above liver activity. Granulomatous disease could also have this pattern but lymphoproliferative disease favored. 2. Mild activity associated with prominent LEFT axillary lymph nodes may also represent lymphoma. 3. Focal activity along the inferior margin of the spleen is indeterminate. No lesion seen on comparison contrast CT 4. No evidence of marrow involvement with lymphoproliferative process. 5. RIGHT middle lobe pulmonary nodule. Recommend attention on follow-up. 6. Fortissue sampling planning, the most assessable lymph node may be the LEFT external iliac node. LEFT axillary nodes are assessable but metabolic activity is significantly less. Electronically Signed   By: Suzy Bouchard M.D.   On: 12/29/2018 10:19    Labs:  CBC: Recent Labs    08/13/18 1601 12/21/18 1513 12/22/18 0345 01/06/19 0753  WBC 9.3 7.9 7.7 7.0  HGB 15.0 14.5 13.9 14.0  HCT 46.7* 43.9 44.6 44.4  PLT 271 212 197 201    COAGS: Recent Labs    01/06/19 0753  INR 0.9    BMP: Recent Labs    02/02/18 1206 08/13/18 1601 12/21/18  1513 12/22/18 0345  NA 140 138 140 137  K 3.9 4.3 4.3 3.9  CL 101 99 102 104  CO2 29 28 28 23   GLUCOSE 101* 83 90 110*  BUN 9 17 12 13   CALCIUM 9.9 10.0 9.6 9.2  CREATININE 0.84 0.81 0.74 0.73  GFRNONAA 74 78 86 >60  GFRAA 86 90 100 >60    LIVER FUNCTION TESTS: Recent Labs    02/02/18 1206 08/13/18 1601 12/21/18 1513 12/22/18 0345  BILITOT 0.3 0.3 0.3 0.4  AST 20 20 21 27   ALT 20 22 21 26   ALKPHOS  --   --   --  70  PROT 7.4 7.6 7.1 7.2  ALBUMIN  --   --   --  4.1    TUMOR MARKERS: No results for input(s): AFPTM, CEA, CA199, CHROMGRNA in the last 8760 hours.  Assessment and Plan:  63 y.o. female with past  medical history significant for hypothyroidism, hysterectomy, osteoporosis, hyperlipidemia and vitamin D deficiency.  She was seen recently in the emergency room on 12/22/2018 for left upper abdominal pain.  Subsequent CT revealed multifocal mild adenopathy in the chest and abdomen.  PET scan on 12/29/2018 revealed:  1. Hypermetabolic retroperitoneal and LEFT iliac lymphadenopathy consistent with lymphoma. 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 Activity is slightly above liver activity. Granulomatous disease could also have this pattern but lymphoproliferative disease favored. 2. Mild activity associated with prominent LEFT axillary lymph nodes may also represent lymphoma. 3. Focal activity along the inferior margin of the spleen is indeterminate. No lesion seen on comparison contrast CT 4. No evidence of marrow involvement with lymphoproliferative process. 5. RIGHT middle lobe pulmonary nodule. Recommend attention on follow-up. 6. Fortissue sampling planning, the most assessable lymph node may be the LEFT external iliac node. LEFT axillary nodes are assessable but metabolic activity is significantly less.  She has no prior history of cancer and presents today for image guided left external iliac lymph node biopsy for further evaluation.Risks and benefits of procedure was discussed with the patient  including, but not limited to bleeding, infection, damage to adjacent structures or low yield requiring additional tests.  All of the questions were answered and there is agreement to proceed.  Consent signed and in chart.     Thank you for this interesting consult.  I greatly enjoyed meeting Natalie Baker and look forward to participating in their care.  A copy of this report was sent to the requesting provider on this date.  Electronically Signed: D. Rowe Robert, PA-C 01/06/2019, 8:24 AM   I spent a total of 25 minutes    in face to face in clinical  consultation, greater than 50% of which was counseling/coordinating care for image guided left external iliac lymph node biopsy

## 2019-01-06 NOTE — Progress Notes (Signed)
Patient returned from IR post biopsy with sedation c/o nausea and vomiting.  IV Zofran given by Linna Hoff, RN from IR.  Patient states nausea improved

## 2019-01-06 NOTE — Discharge Instructions (Signed)
Please call Interventional Radiology [phone] with any questions or concerns about your biopsy.  You may remove your dressing and shower tomorrow.  Moderate Conscious Sedation, Adult, Care After These instructions provide you with information about caring for yourself after your procedure. Your health care provider may also give you more specific instructions. Your treatment has been planned according to current medical practices, but problems sometimes occur. Call your health care provider if you have any problems or questions after your procedure. What can I expect after the procedure? After your procedure, it is common:  To feel sleepy for several hours.  To feel clumsy and have poor balance for several hours.  To have poor judgment for several hours.  To vomit if you eat too soon. Follow these instructions at home: For at least 24 hours after the procedure:   Do not: ? Participate in activities where you could fall or become injured. ? Drive. ? Use heavy machinery. ? Drink alcohol. ? Take sleeping pills or medicines that cause drowsiness. ? Make important decisions or sign legal documents. ? Take care of children on your own.  Rest. Eating and drinking  Follow the diet recommended by your health care provider.  If you vomit: ? Drink water, juice, or soup when you can drink without vomiting. ? Make sure you have little or no nausea before eating solid foods. General instructions  Have a responsible adult stay with you until you are awake and alert.  Take over-the-counter and prescription medicines only as told by your health care provider.  If you smoke, do not smoke without supervision.  Keep all follow-up visits as told by your health care provider. This is important. Contact a health care provider if:  You keep feeling nauseous or you keep vomiting.  You feel light-headed.  You develop a rash.  You have a fever. Get help right away if:  You have  trouble breathing. This information is not intended to replace advice given to you by your health care provider. Make sure you discuss any questions you have with your health care provider. Document Released: 10/14/2012 Document Revised: 12/06/2016 Document Reviewed: 04/15/2015 Elsevier Patient Education  2020 [place].   Needle Biopsy, Care After These instructions tell you how to care for yourself after your procedure. Your doctor may also give you more specific instructions. Call your doctor if you have any problems or questions. What can I expect after the procedure? After the procedure, it is common to have:  Soreness.  Bruising.  Mild pain. Follow these instructions at home:   Return to your normal activities as told by your doctor. Ask your doctor what activities are safe for you.  Take over-the-counter and prescription medicines only as told by your doctor.  Wash your hands with soap and water before you change your bandage (dressing). If you cannot use soap and water, use hand sanitizer.  Follow instructions from your doctor about: ? How to take care of your puncture site. ? When and how to change your bandage. ? When to remove your bandage.  Check your puncture site every day for signs of infection. Watch for: ? Redness, swelling, or pain. ? Fluid or blood. ? Pus or a bad smell. ? Warmth.  Do not take baths, swim, or use a hot tub until your doctor approves. Ask your doctor if you may take showers. You may only be allowed to take sponge baths.  Keep all follow-up visits as told by your doctor. This is important.  Contact a doctor if you have:  A fever.  Redness, swelling, or pain at the puncture site, and it lasts longer than a few days.  Fluid, blood, or pus coming from the puncture site.  Warmth coming from the puncture site. Get help right away if:  You have a lot of bleeding from the puncture site. Summary  After the procedure, it is common to  have soreness, bruising, or mild pain at the puncture site.  Check your puncture site every day for signs of infection, such as redness, swelling, or pain.  Get help right away if you have severe bleeding from your puncture site. This information is not intended to replace advice given to you by your health care provider. Make sure you discuss any questions you have with your health care provider. Document Released: 12/07/2007 Document Revised: 01/06/2017 Document Reviewed: 01/06/2017 Elsevier Patient Education  2020 [employer].

## 2019-01-07 ENCOUNTER — Other Ambulatory Visit: Payer: Self-pay

## 2019-01-11 ENCOUNTER — Other Ambulatory Visit: Payer: Self-pay | Admitting: *Deleted

## 2019-01-11 DIAGNOSIS — R591 Generalized enlarged lymph nodes: Secondary | ICD-10-CM

## 2019-01-11 LAB — SURGICAL PATHOLOGY

## 2019-01-12 ENCOUNTER — Inpatient Hospital Stay: Payer: BC Managed Care – PPO | Attending: Physician Assistant

## 2019-01-12 ENCOUNTER — Other Ambulatory Visit: Payer: Self-pay

## 2019-01-12 ENCOUNTER — Inpatient Hospital Stay: Payer: BC Managed Care – PPO | Admitting: Physician Assistant

## 2019-01-12 ENCOUNTER — Telehealth: Payer: Self-pay | Admitting: Internal Medicine

## 2019-01-12 ENCOUNTER — Encounter: Payer: Self-pay | Admitting: Physician Assistant

## 2019-01-12 ENCOUNTER — Other Ambulatory Visit: Payer: Self-pay | Admitting: Physician Assistant

## 2019-01-12 VITALS — BP 130/70 | HR 83 | Temp 98.1°F | Resp 18 | Ht 62.0 in | Wt 200.1 lb

## 2019-01-12 DIAGNOSIS — Z5189 Encounter for other specified aftercare: Secondary | ICD-10-CM | POA: Insufficient documentation

## 2019-01-12 DIAGNOSIS — Z5111 Encounter for antineoplastic chemotherapy: Secondary | ICD-10-CM | POA: Insufficient documentation

## 2019-01-12 DIAGNOSIS — M2550 Pain in unspecified joint: Secondary | ICD-10-CM | POA: Diagnosis not present

## 2019-01-12 DIAGNOSIS — C8596 Non-Hodgkin lymphoma, unspecified, intrapelvic lymph nodes: Secondary | ICD-10-CM

## 2019-01-12 DIAGNOSIS — R112 Nausea with vomiting, unspecified: Secondary | ICD-10-CM | POA: Insufficient documentation

## 2019-01-12 DIAGNOSIS — Z5112 Encounter for antineoplastic immunotherapy: Secondary | ICD-10-CM | POA: Insufficient documentation

## 2019-01-12 DIAGNOSIS — C8316 Mantle cell lymphoma, intrapelvic lymph nodes: Secondary | ICD-10-CM

## 2019-01-12 DIAGNOSIS — C8310 Mantle cell lymphoma, unspecified site: Secondary | ICD-10-CM | POA: Insufficient documentation

## 2019-01-12 DIAGNOSIS — E039 Hypothyroidism, unspecified: Secondary | ICD-10-CM | POA: Insufficient documentation

## 2019-01-12 DIAGNOSIS — R591 Generalized enlarged lymph nodes: Secondary | ICD-10-CM

## 2019-01-12 DIAGNOSIS — Z23 Encounter for immunization: Secondary | ICD-10-CM | POA: Insufficient documentation

## 2019-01-12 DIAGNOSIS — Z452 Encounter for adjustment and management of vascular access device: Secondary | ICD-10-CM | POA: Insufficient documentation

## 2019-01-12 DIAGNOSIS — C8318 Mantle cell lymphoma, lymph nodes of multiple sites: Secondary | ICD-10-CM | POA: Insufficient documentation

## 2019-01-12 DIAGNOSIS — Z7189 Other specified counseling: Secondary | ICD-10-CM | POA: Insufficient documentation

## 2019-01-12 LAB — CBC WITH DIFFERENTIAL (CANCER CENTER ONLY)
Abs Immature Granulocytes: 0.05 10*3/uL (ref 0.00–0.07)
Basophils Absolute: 0.1 10*3/uL (ref 0.0–0.1)
Basophils Relative: 1 %
Eosinophils Absolute: 0.1 10*3/uL (ref 0.0–0.5)
Eosinophils Relative: 1 %
HCT: 44.3 % (ref 36.0–46.0)
Hemoglobin: 14.2 g/dL (ref 12.0–15.0)
Immature Granulocytes: 1 %
Lymphocytes Relative: 26 %
Lymphs Abs: 1.8 10*3/uL (ref 0.7–4.0)
MCH: 26.9 pg (ref 26.0–34.0)
MCHC: 32.1 g/dL (ref 30.0–36.0)
MCV: 83.9 fL (ref 80.0–100.0)
Monocytes Absolute: 0.7 10*3/uL (ref 0.1–1.0)
Monocytes Relative: 10 %
Neutro Abs: 4.2 10*3/uL (ref 1.7–7.7)
Neutrophils Relative %: 61 %
Platelet Count: 220 10*3/uL (ref 150–400)
RBC: 5.28 MIL/uL — ABNORMAL HIGH (ref 3.87–5.11)
RDW: 14.5 % (ref 11.5–15.5)
WBC Count: 6.9 10*3/uL (ref 4.0–10.5)
nRBC: 0 % (ref 0.0–0.2)

## 2019-01-12 LAB — HEPATITIS PANEL, ACUTE
HCV Ab: NONREACTIVE
Hep A IgM: NONREACTIVE
Hep B C IgM: NONREACTIVE
Hepatitis B Surface Ag: NONREACTIVE

## 2019-01-12 LAB — CMP (CANCER CENTER ONLY)
ALT: 35 U/L (ref 0–44)
AST: 24 U/L (ref 15–41)
Albumin: 4.3 g/dL (ref 3.5–5.0)
Alkaline Phosphatase: 84 U/L (ref 38–126)
Anion gap: 11 (ref 5–15)
BUN: 12 mg/dL (ref 8–23)
CO2: 27 mmol/L (ref 22–32)
Calcium: 9.8 mg/dL (ref 8.9–10.3)
Chloride: 104 mmol/L (ref 98–111)
Creatinine: 0.83 mg/dL (ref 0.44–1.00)
GFR, Est AFR Am: >60 mL/min (ref >60)
GFR, Estimated: >60 mL/min (ref >60)
Glucose, Bld: 96 mg/dL (ref 70–99)
Potassium: 4.4 mmol/L (ref 3.5–5.1)
Sodium: 142 mmol/L (ref 135–145)
Total Bilirubin: 0.3 mg/dL (ref 0.3–1.2)
Total Protein: 7.6 g/dL (ref 6.5–8.1)

## 2019-01-12 LAB — HIV ANTIBODY (ROUTINE TESTING W REFLEX): HIV Screen 4th Generation wRfx: NONREACTIVE

## 2019-01-12 LAB — SEDIMENTATION RATE: Sed Rate: 7 mm/hr (ref 0–22)

## 2019-01-12 LAB — LACTATE DEHYDROGENASE: LDH: 190 U/L (ref 98–192)

## 2019-01-12 LAB — URIC ACID: Uric Acid, Serum: 5.2 mg/dL (ref 2.5–7.1)

## 2019-01-12 MED ORDER — LIDOCAINE-PRILOCAINE 2.5-2.5 % EX CREA: 1.0000 "application " | TOPICAL_CREAM | CUTANEOUS | 0 refills | Status: DC | PRN

## 2019-01-12 NOTE — Patient Instructions (Addendum)
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 As a summary -We will arrange for a bone marrow biopsy to be performed -We will also need an ultrasound of the heart -We will also reach out to the radiology department to see if they can arrange for a port-a-cath. I have sent the numbing cream to your pharmacy -We will see you back for a follow up visit for a more detailed discussion about treatment depending on the results of the bone marrow biopsy.    Non-Hodgkin Lymphoma, Adult Non-Hodgkin lymphoma (NHL) is a cancer of the lymphatic system. The lymphatic system is part of the body's defense (immune) system. It protects the body from:  Infections.  Germs.  Diseases. NHL affects a type of white blood cell called lymphocytes. There are many different types of NHL. NHL can occur in the B lymphocytes (B cells), T lymphocytes (T cells), and natural killer (NK) cells. The kind of NHL you have depends on the type of cells it affects and how quickly it grows and spreads. What are the causes? The cause of this condition is not known. What increases the risk? You are more likely to develop this condition if:  You have a weak immune system, especially after receiving an organ from a donor (transplant).  You have an autoimmune disease like rheumatoid arthritis.  You have infections from bacteria or viruses. These include: ? Human immunodeficiency virus (HIV). ? Epstein-Barr virus.  You are over the age of 41.  You are female.  You are Caucasian.  You have been treated with high-energy beams that destroy cancer cells(radiation therapy) for other cancers. What are the signs or symptoms? Symptoms of this condition depend on the type, where it is in the body, and whether it is fast-growing or slow-growing. Some people may not have any symptoms. Common symptoms include:  Swelling of the collections of tissue that filter bacteria,  viruses, and waste from the bloodstream (lymph nodes) in your neck, armpits, or groin.  Fever.  Unexplained weight loss.  Night sweats.  Tiredness (fatigue). How is this diagnosed? This condition may be diagnosed based on:  A physical exam and medical history.  Chest X-ray.  Testing of: ? Tissue from your lymph gland or bone marrow (biopsy). ? Spinal fluid (lumbar puncture or spinal tap). ? Abdominal or chest fluid. ? Blood. ? Urine.  Imaging tests, such as: ? CT scan. ? PET scan (positron emission tomography). ? MRI. Your health care provider will do more tests to determine whether the cancer has spread, where it has spread to, and how severe it is. This is called staging. You may need to have other tests to look for certain proteins or gene mutations that may help with treatment planning. How is this treated? Treatment for this condition depends on your symptoms, the type and stage of NHL, and how fast it is spreading. Treatment may include:  Radiation therapy.  Chemotherapy. This uses medicine to destroy cancer cells.  Biological therapy. This helps to control the spread of cancer by boosting the body's immune system.  Targeted medicines to treat specific proteins or gene mutations that are present in your lymphoma.  Bone marrow or stem cell transplantation. In this procedure, you receive high doses of chemotherapy followed by a healthy bone marrow or stem cell transplant from a donor.  Participating in clinical trials to see if new (experimental) treatments are effective.  Surgery to remove the lymphoma.  Blood or platelets from a donor (transfusion). This may be done if your blood counts are low. Follow these instructions at home: Medicines  Take over-the-counter and prescription medicines only as told by your health care provider.  Do not take dietary supplements or herbal medicines unless your health care provider tells you to take them. Some supplements can  interfere with how well your treatment works. Lifestyle  Get enough sleep. Most adults need 6-8 hours of sleep each night. During treatment, you may need more sleep.  Consider joining a cancer support group. Ask your health care provider for more information about local and online support groups. General instructions   Drink enough fluid to keep your urine pale yellow.  Wash your hands often with soap and water. If soap and water are not available, use hand sanitizer.  Tell your cancer care team if you develop side effects from treatment. They may be able to recommend ways to manage them.  Keep all follow-up visits as told by your health care provider. This is important. Where to find more information  American Cancer Society: www.cancer.org  Leukemia and Lymphoma Society: PreviewPal.pl  National Cancer Institute ([place]): www.cancer.gov Contact a health care provider if you:  Have new lumps under your arms, on your neck, or near your groin.  Have painful lymph nodes.  Have nausea or vomiting.  Have constipation or diarrhea.  Have trouble urinating or painful urination.  Have a skin rash.  Have abdominal pain.  Have joint pain.  Feel dizzy or light-headed.  Have a fever or chills. Get help right away if you:  Have trouble breathing or chest pain.  Have blood in your urine or stool. Summary  Non-Hodgkin lymphoma (NHL) is a cancer of the lymphatic system. The lymphatic system is part of your body's defense (immune) system.  Treatment for this condition depends on your symptoms, the type and stage of NHL, and how fast it is spreading.  Tell your cancer care team if you develop side effects from treatment. They may be able to recommend ways to manage them.  Contact your health care provider if you have new lumps under your arms, on your neck, or near your groin. This information is not intended to replace advice given to you by your health care provider. Make sure you  discuss any questions you have with your health care provider. Document Revised: 03/12/2018 Document Reviewed: 03/12/2018 Elsevier Patient Education  [place].

## 2019-01-12 NOTE — Progress Notes (Signed)
[facility] OFFICE PROGRESS NOTE  [name], MD [address] [place] [address]  DIAGNOSIS: Non-bulky Non-Hodgkin's Lymphoma, mantle cell lymphoma. She presented with retroperitoneal and left iliac lymphadenopathy.  She also presented with questionable left axillary lymphadenopathy. She was diagnosed in January 2021. Pending further staging workup.   PRIOR THERAPY: None  CURRENT THERAPY: None  INTERVAL HISTORY: Natalie Baker 64 y.o. female returns to clinic today for a follow-up visit accompanied by her daughter, Natalie Baker.  She was initially referred to the clinic after presenting to the emergency room for the chief complaint of left upper quadrant discomfort.  A CT scan which was performed in the emergency department which did not show a clear etiology for her left upper quadrant pain; however, it did incidentally show multifocal non-bulky lymphadenopathy which could result represent lymphoma. The patients left upper quadrant pain has, fortunately, improved at this time.  Today, the patient is feeling fairly well.  She denies any recent fever, chills, night sweats, or palpable lymphadenopathy. She lost approximately 3 pounds since her last visit. She denies any abnormal bleeding or bruising including epistaxis, gingival bleeding, melena, hematochezia, or hematuria. She denies any recent viral infections. She recently had a CT-guided biopsy of the left external iliac lymph node.  She is here today for evaluation and to discuss her current condition and treatment options.   MEDICAL HISTORY: Past Medical History:  Diagnosis Date  . Allergy   . Colon polyp 12/29/2006  . 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 Hyperlipidemia   . Hypothyroidism   . Low kidney function   . Obesity    BMI 34  . Osteoporosis   . Palpitations   . Vitamin D deficiency     ALLERGIES:  is allergic to ppd [tuberculin purified protein derivative].  MEDICATIONS:  Current Outpatient  Medications  Medication Sig Dispense Refill  . b complex vitamins tablet Take 1 tablet by mouth daily.    . Biotin 1000 MCG CHEW Chew 5,000 mcg by mouth daily.    . cholecalciferol (VITAMIN D3) 25 MCG (1000 UT) tablet Take 10,000 Units by mouth daily.    . Coenzyme Q10 (COQ10 PO) Take 1 tablet by mouth 2 (two) times daily.    Marland Kitchen estradiol (VIVELLE-DOT) 0.05 MG/24HR patch Place 1 patch onto the skin 2 (two) times a week.    . Flaxseed, Linseed, (FLAX SEED OIL PO) Take 2 capsules by mouth 2 (two) times daily.    . metaxalone (SKELAXIN) 800 MG tablet Take 1 tablet (800 mg total) by mouth 3 (three) times daily. 30 tablet 1  . NATURE-THROID 65 MG tablet     . OVER THE COUNTER MEDICATION daily. Tumeric    . progesterone (PROMETRIUM) 100 MG capsule     . rosuvastatin (CRESTOR) 5 MG tablet Start by taking 1 tab by mouth at night once weekly; if tolerating, slowly increase frequency up to taking three nights a week (MWF). 30 tablet 1   No current facility-administered medications for this visit.    SURGICAL HISTORY:  Past Surgical History:  Procedure Laterality Date  . ABDOMINAL HYSTERECTOMY  1997  . CESAREAN SECTION  1983  . COLONOSCOPY W/ BIOPSIES  12/29/2006  . LAPAROSCOPY     1991 for emdometriosis  . UPPER GASTROINTESTINAL ENDOSCOPY      REVIEW OF SYSTEMS:   Review of Systems  Constitutional: Negative for appetite change, chills, fatigue, fever and unexpected weight change.  HENT: Negative for mouth sores, nosebleeds, sore throat and  trouble swallowing.   Eyes: Negative for eye problems and icterus.  Respiratory: Negative for cough, hemoptysis, shortness of breath and wheezing.  Cardiovascular: Negative for chest pain and leg swelling.  Gastrointestinal: Negative for abdominal pain, constipation, diarrhea, nausea and vomiting.  Genitourinary: Negative for bladder incontinence, difficulty urinating, dysuria, frequency and hematuria.   Musculoskeletal: Negative for back pain, gait  problem, neck pain and neck stiffness.  Skin: Negative for itching and rash.  Neurological: Negative for dizziness, extremity weakness, gait problem, headaches, light-headedness and seizures.  Hematological: Negative for adenopathy. Does not bruise/bleed easily.  Psychiatric/Behavioral: Negative for confusion, depression and sleep disturbance. The patient is not nervous/anxious.     PHYSICAL EXAMINATION:  Blood pressure 130/70, pulse 83, temperature 98.1 F (36.7 C), temperature source Temporal, resp. rate 18, height _0  (1.575 m), weight 200 lb 1.6 oz (90.8 kg), SpO2 96 %.  ECOG PERFORMANCE STATUS: 0 - Asymptomatic  Physical Exam  Constitutional: Oriented to person, place, and time and well-developed, well-nourished, and in no distress.  HENT:  Head: Normocephalic and atraumatic.  Mouth/Throat: Oropharynx is clear and moist. No oropharyngeal exudate.  Eyes: Conjunctivae are normal. Right eye exhibits no discharge. Left eye exhibits no discharge. No scleral icterus.  Neck: Normal range of motion. Neck supple.  Cardiovascular: Normal rate, regular rhythm, normal heart sounds and intact distal pulses.   Pulmonary/Chest: Effort normal and breath sounds normal. No respiratory distress. No wheezes. No rales.  Abdominal: Soft. Bowel sounds are normal. Exhibits no distension and no mass. There is no tenderness.  Musculoskeletal: Normal range of motion. Exhibits no edema.  Lymphadenopathy:    No cervical adenopathy.  Neurological: Alert and oriented to person, place, and time. Exhibits normal muscle tone. Gait normal. Coordination normal.  Skin: Skin is warm and dry. No rash noted. Not diaphoretic. No erythema. No pallor.  Psychiatric: Mood, memory and judgment normal.  Vitals reviewed.  LABORATORY DATA: Lab Results  Component Value Date   WBC 6.9 01/12/2019   HGB 14.2 01/12/2019   HCT 44.3 01/12/2019   MCV 83.9 01/12/2019   PLT 220 01/12/2019      Chemistry      Component Value  Date/Time   NA 137 12/22/2018 0345   K 3.9 12/22/2018 0345   CL 104 12/22/2018 0345   CO2 23 12/22/2018 0345   BUN 13 12/22/2018 0345   CREATININE 0.73 12/22/2018 0345   CREATININE 0.74 12/21/2018 1513      Component Value Date/Time   CALCIUM 9.2 12/22/2018 0345   ALKPHOS 70 12/22/2018 0345   AST 27 12/22/2018 0345   ALT 26 12/22/2018 0345   BILITOT 0.4 12/22/2018 0345       RADIOGRAPHIC STUDIES:  CT Angio Chest PE W and/or Wo Contrast  Result Date: 12/22/2018 CLINICAL DATA:  Left upper quadrant abdominal pain. Shortness of breath. EXAM: CT ANGIOGRAPHY CHEST CT ABDOMEN AND PELVIS WITH CONTRAST TECHNIQUE: Multidetector CT imaging of the chest was performed using the standard protocol during bolus administration of intravenous contrast. Multiplanar CT image reconstructions and MIPs were obtained to evaluate the vascular anatomy. Multidetector CT imaging of the abdomen and pelvis was performed using the standard protocol during bolus administration of intravenous contrast. CONTRAST:  18m OMNIPAQUE IOHEXOL 350 MG/ML SOLN COMPARISON:  None. FINDINGS: CTA CHEST FINDINGS Cardiovascular: There are no filling defects within the pulmonary arteries to suggest pulmonary embolus. No aortic dissection or aneurysm. Heart is normal in size. No pericardial effusion. Mediastinum/Nodes: Enlarged left supraclavicular nodes measuring up to 15 mm  short axis, series 4, image 7. Prominent subcarinal node measuring 10 mm. Prominent bilateral axillary nodes measuring 10 mm on the right and 11 mm on the left. There is a prominent left epicardial node measuring 10 mm. No hilar adenopathy. Decompressed esophagus. No thyroid nodule. Prominent right epicardial fat pad. Lungs/Pleura: No acute airspace disease. No pleural fluid. No evidence pulmonary edema. Mild lower lobe bronchial thickening. Compressive atelectasis in the right lower lobe related to mild diaphragm elevation. Trachea and mainstem bronchi are patent. 3 mm  pleural-based nodule in the right lower lobe series 6, image 53. Trace left pleural thickening without effusion. Musculoskeletal: Bone island in right anterior rib. There are no acute or suspicious osseous abnormalities. Review of the MIP images confirms the above findings. CT ABDOMEN and PELVIS FINDINGS Hepatobiliary: Elongated left lobe of the liver wrapping into the left upper quadrant. No focal liver lesion. Minimal high density within the gallbladder neck may be stones or sludge. No pericholecystic inflammation. No biliary dilatation. Pancreas: No ductal dilatation or inflammation. Spleen: Normal in size, greatest splenic dimension 11.8 cm AP. No focal abnormality. Adrenals/Urinary Tract: No adrenal nodule. No hydronephrosis or perinephric edema. Homogeneous renal enhancement with symmetric excretion on delayed phase imaging. Subcentimeter low-density in the lower left kidney is too small to characterize but likely small cyst. Additional parapelvic cysts in the lower left kidney. Urinary bladder is nondistended. Stomach/Bowel: Nondistended stomach without gastric wall thickening. No small bowel obstruction or inflammation. Normal appendix, series 4, image 43 moderate volume of stool in the proximal colon. Small volume of stool distally. Vascular/Lymphatic: Retroperitoneal adenopathy with left periaortic node measuring 17 mm short axis, series 2, image 30 just below the left renal vein. Possible additional lymph node anterior left periaortic station measuring 19 mm, however difficult to differentiate from adjacent small bowel. Multiple additional prominent retroperitoneal nodes extend from the SMA to the iliac bifurcation. Porta hepatis adenopathy with enlarged node measuring 14 mm. Portal caval node measures 12 mm. Enlarged ileocolic nodes, largest measuring 19 mm, series 2, image 53. Additional prominent central mesenteric lymph nodes. Increased number of prominent bilateral external iliac nodes. No inguinal  adenopathy. Mild aortic atherosclerosis. No aneurysm. Patent portal vein. Reproductive: Status post hysterectomy. No adnexal masses. Other: No ascites. No free air. Musculoskeletal: There are no acute or suspicious osseous abnormalities. Review of the MIP images confirms the above findings. IMPRESSION: 1. No pulmonary embolus or acute intrathoracic abnormality. 2. Multifocal mild adenopathy in the chest and abdomen, including left supraclavicular, axillary, epicardial, retroperitoneal, porta hepatis and ileocolic. Largest lymph node measures 19 mm greatest dimension in the retroperitoneum. Overall appearance is suspicious for lymphoproliferative disorder such as lymphoma. Recommend clinical/laboratory evaluation, consider oncologic referral. Metastatic disease is also considered, however multifocal nature argues against this. 3. Possible gallstone or sludge in the gallbladder. Aortic Atherosclerosis (ICD10-I70.0). Electronically Signed   By: Keith Rake M.D.   On: 12/22/2018 06:37   CT ABDOMEN PELVIS W CONTRAST  Result Date: 12/22/2018 CLINICAL DATA:  Left upper quadrant abdominal pain. Shortness of breath. EXAM: CT ANGIOGRAPHY CHEST CT ABDOMEN AND PELVIS WITH CONTRAST TECHNIQUE: Multidetector CT imaging of the chest was performed using the standard protocol during bolus administration of intravenous contrast. Multiplanar CT image reconstructions and MIPs were obtained to evaluate the vascular anatomy. Multidetector CT imaging of the abdomen and pelvis was performed using the standard protocol during bolus administration of intravenous contrast. CONTRAST:  146m OMNIPAQUE IOHEXOL 350 MG/ML SOLN COMPARISON:  None. FINDINGS: CTA CHEST FINDINGS Cardiovascular: There are no  filling defects within the pulmonary arteries to suggest pulmonary embolus. No aortic dissection or aneurysm. Heart is normal in size. No pericardial effusion. Mediastinum/Nodes: Enlarged left supraclavicular nodes measuring up to 15 mm  short axis, series 4, image 7. Prominent subcarinal node measuring 10 mm. Prominent bilateral axillary nodes measuring 10 mm on the right and 11 mm on the left. There is a prominent left epicardial node measuring 10 mm. No hilar adenopathy. Decompressed esophagus. No thyroid nodule. Prominent right epicardial fat pad. Lungs/Pleura: No acute airspace disease. No pleural fluid. No evidence pulmonary edema. Mild lower lobe bronchial thickening. Compressive atelectasis in the right lower lobe related to mild diaphragm elevation. Trachea and mainstem bronchi are patent. 3 mm pleural-based nodule in the right lower lobe series 6, image 53. Trace left pleural thickening without effusion. Musculoskeletal: Bone island in right anterior rib. There are no acute or suspicious osseous abnormalities. Review of the MIP images confirms the above findings. CT ABDOMEN and PELVIS FINDINGS Hepatobiliary: Elongated left lobe of the liver wrapping into the left upper quadrant. No focal liver lesion. Minimal high density within the gallbladder neck may be stones or sludge. No pericholecystic inflammation. No biliary dilatation. Pancreas: No ductal dilatation or inflammation. Spleen: Normal in size, greatest splenic dimension 11.8 cm AP. No focal abnormality. Adrenals/Urinary Tract: No adrenal nodule. No hydronephrosis or perinephric edema. Homogeneous renal enhancement with symmetric excretion on delayed phase imaging. Subcentimeter low-density in the lower left kidney is too small to characterize but likely small cyst. Additional parapelvic cysts in the lower left kidney. Urinary bladder is nondistended. Stomach/Bowel: Nondistended stomach without gastric wall thickening. No small bowel obstruction or inflammation. Normal appendix, series 4, image 43 moderate volume of stool in the proximal colon. Small volume of stool distally. Vascular/Lymphatic: Retroperitoneal adenopathy with left periaortic node measuring 17 mm short axis, series 2,  image 30 just below the left renal vein. Possible additional lymph node anterior left periaortic station measuring 19 mm, however difficult to differentiate from adjacent small bowel. Multiple additional prominent retroperitoneal nodes extend from the SMA to the iliac bifurcation. Porta hepatis adenopathy with enlarged node measuring 14 mm. Portal caval node measures 12 mm. Enlarged ileocolic nodes, largest measuring 19 mm, series 2, image 53. Additional prominent central mesenteric lymph nodes. Increased number of prominent bilateral external iliac nodes. No inguinal adenopathy. Mild aortic atherosclerosis. No aneurysm. Patent portal vein. Reproductive: Status post hysterectomy. No adnexal masses. Other: No ascites. No free air. Musculoskeletal: There are no acute or suspicious osseous abnormalities. Review of the MIP images confirms the above findings. IMPRESSION: 1. No pulmonary embolus or acute intrathoracic abnormality. 2. Multifocal mild adenopathy in the chest and abdomen, including left supraclavicular, axillary, epicardial, retroperitoneal, porta hepatis and ileocolic. Largest lymph node measures 19 mm greatest dimension in the retroperitoneum. Overall appearance is suspicious for lymphoproliferative disorder such as lymphoma. Recommend clinical/laboratory evaluation, consider oncologic referral. Metastatic disease is also considered, however multifocal nature argues against this. 3. Possible gallstone or sludge in the gallbladder. Aortic Atherosclerosis (ICD10-I70.0). Electronically Signed   By: Keith Rake M.D.   On: 12/22/2018 06:37   NM PET Image Restag (PS) Skull Base To Thigh  Result Date: 12/29/2018 CLINICAL DATA:  Initial treatment strategy for lymphadenopathy. EXAM: NUCLEAR MEDICINE PET SKULL BASE TO THIGH TECHNIQUE: 10.0 mCi F-18 FDG was injected intravenously. Full-ring PET imaging was performed from the skull base to thigh after the radiotracer. CT data was obtained and used for  attenuation correction and anatomic localization. Fasting blood glucose: 102  mg/dl COMPARISON:  CT scan 12/22/2018 FINDINGS: Mediastinal blood pool activity: SUV max 2.9 Liver activity: SUV max 4.2 NECK: No hypermetabolic lymph nodes in the neck. Incidental CT findings: none CHEST: Mild metabolic activity within the axillary lymph nodes similar to background blood pool activity and less than liver activity. Example LEFT axillary node measures 9 mm short axis with SUV max 3.0 No hypermetabolic mediastinal nodes.  No enlarged mediastinal nodes Nodule in the RIGHT middle lobe measures 6 mm (68/3). No associated metabolic activity. There is atelectasis in the more peripheral RIGHT middle lobe also without metabolic Incidental CT findings: none ABDOMEN/PELVIS: enlarged retroperitoneal lymph node adjacent to the SMA measures 20 mm short axis and with moderate metabolic activity (SUV max equal 5.0) Enlarged LEFTinternal iliac lymph node measures 12 mm (image 133/3) with (SUV max equal 5.5) There is enlarged LEFT external iliac lymph node measuring 19 mm on image 153/3 with SUV max equal 5.1. No enlarged hypermetabolic inguinal nodes. No central mesenteric nodes. There is a focus of metabolic activity along the inferior margin of the spleen SUV max equal 5.5 (image 107 fused data set.) No clear splenic lesion associated with this activity. Incidental CT findings: SKELETON: No focal hypermetabolic activity to suggest skeletal metastasis. Incidental CT findings: none IMPRESSION: 1. Hypermetabolic retroperitoneal and LEFT iliac lymphadenopathy consistent with lymphoma. 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 Activity is slightly above liver activity. Granulomatous disease could also have this pattern but lymphoproliferative disease favored. 2. Mild activity associated with prominent LEFT axillary lymph nodes may also represent lymphoma. 3. Focal activity along the inferior margin of the spleen is  indeterminate. No lesion seen on comparison contrast CT 4. No evidence of marrow involvement with lymphoproliferative process. 5. RIGHT middle lobe pulmonary nodule. Recommend attention on follow-up. 6. Fortissue sampling planning, the most assessable lymph node may be the LEFT external iliac node. LEFT axillary nodes are assessable but metabolic activity is significantly less. Electronically Signed   By: Suzy Bouchard M.D.   On: 12/29/2018 10:19   CT Biopsy  Result Date: 01/06/2019 CLINICAL DATA:  Hypermetabolic retroperitoneal and left iliac lymphadenopathy. No known primary. EXAM: CT GUIDED CORE BIOPSY OF LEFT EXTERNAL ILIAC ADENOPATHY ANESTHESIA/SEDATION: Intravenous Fentanyl 12mg and Versed 108mwere administered as conscious sedation during continuous monitoring of the patient's level of consciousness and physiological / cardiorespiratory status by the radiology RN, with a total moderate sedation time of 15 minutes. PROCEDURE: The procedure risks, benefits, and alternatives were explained to the patient. Questions regarding the procedure were encouraged and answered. The patient understands and consents to the procedure. Select axial scans through the pelvis were obtained. The left external iliac adenopathy was localized and an appropriate skin entry site was determined and marked. The operative field was prepped with chlorhexidinein a sterile fashion, and a sterile drape was applied covering the operative field. A sterile gown and sterile gloves were used for the procedure. Local anesthesia was provided with 1% Lidocaine. Under CT fluoroscopic guidance, a 17 gauge trocar needle was advanced to the margin of the lesion. Once needle tip position was confirmed, coaxial 18-gauge core biopsy samples were obtained, submitted in saline to surgical pathology. The guide needle was removed. Postprocedure scans show no hemorrhage or other apparent complication. The patient tolerated the procedure well.  COMPLICATIONS: None immediate FINDINGS: Left external iliac adenopathy was localized. Representative core biopsy samples obtained and submitted in saline to surgical pathology. IMPRESSION: 1. Technically successful CT-guided core biopsy, left external iliac adenopathy. Electronically Signed  By: Lucrezia Europe M.D.   On: 01/06/2019 16:42     ASSESSMENT/PLAN:  This is a very pleasant 64 year old Caucasian female recently diagnosed with non-Hodgkin's lymphoma, mantle cell lymphoma. She presented with retroperitoneal and left iliac lymphadenopathy.  She also presented with questionable left axillary lymphadenopathy. She was diagnosed in January 2021. Pending further staging workup.   The patient recently had a CT-guided lymph node biopsy performed to confirm the diagnosis.   The patient was seen with Dr. Julien Nordmann today.  Dr. Julien Nordmann reviewed the pathology results with the patient and her daughter today.  The patient's pathology was consistent with non-Hodgkin's lymphoma, mantle cell lymphoma.   Dr. Julien Nordmann had a lengthy discussion today with the patient about her current condition and treatment options.  Dr. Julien Nordmann recommended the patient undergo a bone marrow biopsy and aspirate to rule out any bone marrow involvement complete the staging work-up before determining the best treatment option.  I will arrange for the patient to have a bone marrow biopsy and aspirate performed in the next few days.  I will also arrange for the patient to have a Port-A-Cath placement in anticipation of starting chemotherapy in about 2 weeks.  I will arrange for the patient to have an echocardiogram performed to obtain a baseline assessment of her cardiac function prior to starting treatment.  We will see the patient back for a follow-up visit next week to review her results from her bone marrow biopsy and aspirate and to have a more detailed discussion regarding her recommended treatment pending the results of her bone  marrow biopsy.  The patient was advised to call immediately if she has any concerning symptoms in the interval. The patient voices understanding of current disease status and treatment options and is in agreement with the current care plan. All questions were answered. The patient knows to call the clinic with any problems, questions or concerns. We can certainly see the patient much sooner if necessary   No orders of the defined types were placed in this encounter.    Lashona Schaaf L Taydon Nasworthy, PA-C 01/12/19  ADDENDUM: Hematology/Oncology Attending: I had a face-to-face encounter with the patient today.  I recommended her care plan.  This is a very pleasant 64 years old white female who came to the clinic today accompanied by her daughter Natalie Baker for follow-up visit.  The patient was recently found to have suspicious lymphadenopathy on imaging studies including PET scans that showed hypermetabolic retroperitoneal as well as left iliac lymphadenopathy in addition to prominent left axillary lymph nodes.  The finding were suspicious for lymphoma.  The patient underwent CT-guided core biopsy of the left external iliac lymphadenopathy by interventional radiology.  The final pathology ([id number] ) was consistent with mantle cell lymphoma. I had a lengthy discussion with the patient and her daughter today about her current diagnosis and treatment options.  We will order a bone marrow biopsy and aspirate to rule out any bone marrow involvement. We will also order hepatitis panel in addition to EBV, HIV, uric acid as well as beta-2 microglobulin. We will arrange for the patient to come back for follow-up visit next week for more detailed discussion of her treatment options based on the bone marrow results. If the patient has no evidence of bone marrow involvement, she will be treated for nonbulky stage II with either bendamustine and Rituxan versus R-CHOP.  If the bone marrow biopsy and aspirate showed  evidence for involvement by the mantle cell lymphoma, she may be treated with  an aggressive approach with hyper CVAT or R-CHOP max. The patient and her daughter agreed to the current plan. She was advised to call immediately if she has any concerning symptoms in the interval.  Disclaimer: This note was dictated with voice recognition software. Similar sounding words can inadvertently be transcribed and may be missed upon review. Eilleen Kempf, MD 01/12/19

## 2019-01-12 NOTE — Telephone Encounter (Signed)
Scheduled appt per 1/5 los - gave patient AVS and calender per los.   

## 2019-01-13 LAB — EPSTEIN BARR VRS(EBV DNA BY PCR)
EBV DNA QN by PCR: NEGATIVE copies/mL
log10 EBV DNA Qn PCR: UNDETERMINED log10 copy/mL

## 2019-01-13 LAB — BETA 2 MICROGLOBULIN, SERUM: Beta-2 Microglobulin: 2 mg/L (ref 0.6–2.4)

## 2019-01-14 ENCOUNTER — Other Ambulatory Visit: Payer: Self-pay | Admitting: Student

## 2019-01-14 ENCOUNTER — Telehealth: Payer: Self-pay

## 2019-01-14 NOTE — Telephone Encounter (Signed)
Spoke with Pt returning my call to confirm appointment. For Friday 01/15/19.

## 2019-01-15 ENCOUNTER — Inpatient Hospital Stay (HOSPITAL_BASED_OUTPATIENT_CLINIC_OR_DEPARTMENT_OTHER): Payer: BC Managed Care – PPO | Admitting: Adult Health

## 2019-01-15 ENCOUNTER — Other Ambulatory Visit: Payer: Self-pay | Admitting: Physician Assistant

## 2019-01-15 ENCOUNTER — Other Ambulatory Visit: Payer: Self-pay

## 2019-01-15 ENCOUNTER — Encounter (HOSPITAL_COMMUNITY): Payer: Self-pay

## 2019-01-15 ENCOUNTER — Inpatient Hospital Stay: Payer: BC Managed Care – PPO

## 2019-01-15 ENCOUNTER — Ambulatory Visit (HOSPITAL_COMMUNITY)
Admission: RE | Admit: 2019-01-15 | Discharge: 2019-01-15 | Disposition: A | Discharge status: Home / Self Care | Payer: BC Managed Care – PPO | Source: Ambulatory Visit | Attending: Physician Assistant | Admitting: Physician Assistant

## 2019-01-15 VITALS — BP 129/47 | HR 90 | Temp 98.0°F | Resp 16

## 2019-01-15 DIAGNOSIS — Z452 Encounter for adjustment and management of vascular access device: Secondary | ICD-10-CM | POA: Diagnosis not present

## 2019-01-15 DIAGNOSIS — Z79899 Other long term (current) drug therapy: Secondary | ICD-10-CM | POA: Insufficient documentation

## 2019-01-15 DIAGNOSIS — C8315 Mantle cell lymphoma, lymph nodes of inguinal region and lower limb: Secondary | ICD-10-CM | POA: Diagnosis not present

## 2019-01-15 DIAGNOSIS — C8316 Mantle cell lymphoma, intrapelvic lymph nodes: Secondary | ICD-10-CM

## 2019-01-15 DIAGNOSIS — E039 Hypothyroidism, unspecified: Secondary | ICD-10-CM | POA: Diagnosis not present

## 2019-01-15 DIAGNOSIS — C8590 Non-Hodgkin lymphoma, unspecified, unspecified site: Secondary | ICD-10-CM | POA: Diagnosis not present

## 2019-01-15 DIAGNOSIS — Z23 Encounter for immunization: Secondary | ICD-10-CM | POA: Diagnosis not present

## 2019-01-15 DIAGNOSIS — C8318 Mantle cell lymphoma, lymph nodes of multiple sites: Secondary | ICD-10-CM | POA: Diagnosis not present

## 2019-01-15 DIAGNOSIS — Z5189 Encounter for other specified aftercare: Secondary | ICD-10-CM | POA: Diagnosis not present

## 2019-01-15 DIAGNOSIS — R112 Nausea with vomiting, unspecified: Secondary | ICD-10-CM | POA: Diagnosis not present

## 2019-01-15 DIAGNOSIS — Z6834 Body mass index (BMI) 34.0-34.9, adult: Secondary | ICD-10-CM | POA: Insufficient documentation

## 2019-01-15 DIAGNOSIS — E785 Hyperlipidemia, unspecified: Secondary | ICD-10-CM | POA: Diagnosis not present

## 2019-01-15 DIAGNOSIS — M2550 Pain in unspecified joint: Secondary | ICD-10-CM | POA: Diagnosis not present

## 2019-01-15 DIAGNOSIS — Z5112 Encounter for antineoplastic immunotherapy: Secondary | ICD-10-CM | POA: Diagnosis not present

## 2019-01-15 DIAGNOSIS — C8599 Non-Hodgkin lymphoma, unspecified, extranodal and solid organ sites: Secondary | ICD-10-CM | POA: Diagnosis not present

## 2019-01-15 DIAGNOSIS — E559 Vitamin D deficiency, unspecified: Secondary | ICD-10-CM | POA: Insufficient documentation

## 2019-01-15 DIAGNOSIS — E669 Obesity, unspecified: Secondary | ICD-10-CM | POA: Diagnosis not present

## 2019-01-15 DIAGNOSIS — Z5111 Encounter for antineoplastic chemotherapy: Secondary | ICD-10-CM | POA: Diagnosis not present

## 2019-01-15 DIAGNOSIS — C8310 Mantle cell lymphoma, unspecified site: Secondary | ICD-10-CM | POA: Diagnosis not present

## 2019-01-15 HISTORY — PX: IR IMAGING GUIDED PORT INSERTION: IMG5740

## 2019-01-15 HISTORY — DX: Malignant (primary) neoplasm, unspecified: C80.1

## 2019-01-15 LAB — CBC WITH DIFFERENTIAL/PLATELET
Abs Immature Granulocytes: 0.03 10*3/uL (ref 0.00–0.07)
Basophils Absolute: 0.1 10*3/uL (ref 0.0–0.1)
Basophils Relative: 1 %
Eosinophils Absolute: 0.1 10*3/uL (ref 0.0–0.5)
Eosinophils Relative: 1 %
HCT: 42.6 % (ref 36.0–46.0)
Hemoglobin: 13.3 g/dL (ref 12.0–15.0)
Immature Granulocytes: 0 %
Lymphocytes Relative: 30 %
Lymphs Abs: 2.3 10*3/uL (ref 0.7–4.0)
MCH: 26.4 pg (ref 26.0–34.0)
MCHC: 31.2 g/dL (ref 30.0–36.0)
MCV: 84.7 fL (ref 80.0–100.0)
Monocytes Absolute: 0.8 10*3/uL (ref 0.1–1.0)
Monocytes Relative: 10 %
Neutro Abs: 4.5 10*3/uL (ref 1.7–7.7)
Neutrophils Relative %: 58 %
Platelets: 202 10*3/uL (ref 150–400)
RBC: 5.03 MIL/uL (ref 3.87–5.11)
RDW: 14.3 % (ref 11.5–15.5)
WBC: 7.7 10*3/uL (ref 4.0–10.5)
nRBC: 0 % (ref 0.0–0.2)

## 2019-01-15 LAB — CBC WITH DIFFERENTIAL (CANCER CENTER ONLY)
Abs Immature Granulocytes: 0.03 10*3/uL (ref 0.00–0.07)
Basophils Absolute: 0 10*3/uL (ref 0.0–0.1)
Basophils Relative: 1 %
Eosinophils Absolute: 0.1 10*3/uL (ref 0.0–0.5)
Eosinophils Relative: 2 %
HCT: 39.7 % (ref 36.0–46.0)
Hemoglobin: 12.6 g/dL (ref 12.0–15.0)
Immature Granulocytes: 1 %
Lymphocytes Relative: 32 %
Lymphs Abs: 1.9 10*3/uL (ref 0.7–4.0)
MCH: 26.9 pg (ref 26.0–34.0)
MCHC: 31.7 g/dL (ref 30.0–36.0)
MCV: 84.6 fL (ref 80.0–100.0)
Monocytes Absolute: 0.5 10*3/uL (ref 0.1–1.0)
Monocytes Relative: 9 %
Neutro Abs: 3.5 10*3/uL (ref 1.7–7.7)
Neutrophils Relative %: 55 %
Platelet Count: 189 10*3/uL (ref 150–400)
RBC: 4.69 MIL/uL (ref 3.87–5.11)
RDW: 14.1 % (ref 11.5–15.5)
WBC Count: 6.1 10*3/uL (ref 4.0–10.5)
nRBC: 0 % (ref 0.0–0.2)

## 2019-01-15 LAB — APTT: aPTT: 29 seconds (ref 24–36)

## 2019-01-15 LAB — PROTIME-INR
INR: 0.9 (ref 0.8–1.2)
Prothrombin Time: 12 seconds (ref 11.4–15.2)

## 2019-01-15 MED ORDER — HEPARIN SOD (PORK) LOCK FLUSH 100 UNIT/ML IV SOLN
INTRAVENOUS | Status: AC | PRN
  Administered 2019-01-15: 500 [IU] via INTRAVENOUS

## 2019-01-15 MED ORDER — LIDOCAINE-EPINEPHRINE 1 %-1:100000 IJ SOLN
INTRAMUSCULAR | Status: AC | PRN
  Administered 2019-01-15: 10 mL

## 2019-01-15 MED ORDER — CEFAZOLIN SODIUM-DEXTROSE 2-4 GM/100ML-% IV SOLN: 2.0000 g | Freq: Once | INTRAVENOUS | Status: AC

## 2019-01-15 MED ORDER — CEFAZOLIN SODIUM-DEXTROSE 2-4 GM/100ML-% IV SOLN
INTRAVENOUS | Status: AC
  Administered 2019-01-15: 2 g via INTRAVENOUS
  Filled 2019-01-15: qty 100

## 2019-01-15 MED ORDER — FENTANYL CITRATE (PF) 100 MCG/2ML IJ SOLN
INTRAMUSCULAR | Status: AC
  Filled 2019-01-15: qty 2

## 2019-01-15 MED ORDER — LIDOCAINE HCL 1 % IJ SOLN
INTRAMUSCULAR | Status: AC
  Filled 2019-01-15: qty 20

## 2019-01-15 MED ORDER — HEPARIN SOD (PORK) LOCK FLUSH 100 UNIT/ML IV SOLN
INTRAVENOUS | Status: AC
  Filled 2019-01-15: qty 5

## 2019-01-15 MED ORDER — MIDAZOLAM HCL 2 MG/2ML IJ SOLN
INTRAMUSCULAR | Status: AC
  Filled 2019-01-15: qty 4

## 2019-01-15 MED ORDER — MIDAZOLAM HCL 2 MG/2ML IJ SOLN
INTRAMUSCULAR | Status: AC | PRN
  Administered 2019-01-15 (×2): 1 mg via INTRAVENOUS

## 2019-01-15 MED ORDER — FENTANYL CITRATE (PF) 100 MCG/2ML IJ SOLN
INTRAMUSCULAR | Status: AC | PRN
  Administered 2019-01-15 (×2): 50 ug via INTRAVENOUS

## 2019-01-15 MED ORDER — LIDOCAINE HCL (PF) 1 % IJ SOLN
INTRAMUSCULAR | Status: AC | PRN
  Administered 2019-01-15: 5 mL

## 2019-01-15 MED ORDER — LIDOCAINE HCL 2 % IJ SOLN
INTRAMUSCULAR | Status: AC
  Filled 2019-01-15: qty 20

## 2019-01-15 MED ORDER — SODIUM CHLORIDE 0.9 % IV SOLN: INTRAVENOUS | Status: DC

## 2019-01-15 NOTE — Patient Instructions (Signed)
Bone Marrow Aspiration and Bone Marrow Biopsy, Adult, Care After This sheet gives you information about how to care for yourself after your procedure. Your health care provider may also give you more specific instructions. If you have problems or questions, contact your health care provider. What can I expect after the procedure? After the procedure, it is common to have:  Mild pain and tenderness.  Swelling.  Bruising. Follow these instructions at home: Puncture site care   Follow instructions from your health care provider about how to take care of the puncture site. Make sure you: ? Wash your hands with soap and water before and after you change your bandage (dressing). If soap and water are not available, use hand sanitizer. ? Change your dressing as told by your health care provider.  Check your puncture site every day for signs of infection. Check for: ? More redness, swelling, or pain. ? Fluid or blood. ? Warmth. ? Pus or a bad smell. Activity  Return to your normal activities as told by your health care provider. Ask your health care provider what activities are safe for you.  Do not lift anything that is heavier than 10 lb (4.5 kg), or the limit that you are told, until your health care provider says that it is safe.  Do not drive for 24 hours if you were given a sedative during your procedure. General instructions   Take over-the-counter and prescription medicines only as told by your health care provider.  Do not take baths, swim, or use a hot tub until your health care provider approves. Ask your health care provider if you may take showers. You may only be allowed to take sponge baths.  If directed, put ice on the affected area. To do this: ? Put ice in a plastic bag. ? Place a towel between your skin and the bag. ? Leave the ice on for 20 minutes, 2-3 times a day.  Keep all follow-up visits as told by your health care provider. This is important. Contact a  health care provider if:  Your pain is not controlled with medicine.  You have a fever.  You have more redness, swelling, or pain around the puncture site.  You have fluid or blood coming from the puncture site.  Your puncture site feels warm to the touch.  You have pus or a bad smell coming from the puncture site. Summary  After the procedure, it is common to have mild pain, tenderness, swelling, and bruising.  Follow instructions from your health care provider about how to take care of the puncture site and what activities are safe for you.  Take over-the-counter and prescription medicines only as told by your health care provider.  Contact a health care provider if you have any signs of infection, such as fluid or blood coming from the puncture site. This information is not intended to replace advice given to you by your health care provider. Make sure you discuss any questions you have with your health care provider. Document Revised: 05/12/2018 Document Reviewed: 05/12/2018 Elsevier Patient Education  2020 [employer]  

## 2019-01-15 NOTE — Discharge Instructions (Signed)
No EMLA cream for two weeks until port has healed.    Implanted Port Insertion, Care After This sheet gives you information about how to care for yourself after your procedure. Your health care provider may also give you more specific instructions. If you have problems or questions, contact your health care provider. What can I expect after the procedure? After the procedure, it is common to have:  Discomfort at the port insertion site.  Bruising on the skin over the port. This should improve over 3-4 days. Follow these instructions at home: [facility] care  After your port is placed, you will get a manufacturer's information card. The card has information about your port. Keep this card with you at all times.  Take care of the port as told by your health care provider. Ask your health care provider if you or a family member can get training for taking care of the port at home. A home health care nurse may also take care of the port.  Make sure to remember what type of port you have. Incision care      Follow instructions from your health care provider about how to take care of your port insertion site. Make sure you: ? Wash your hands with soap and water before and after you change your bandage (dressing). If soap and water are not available, use hand sanitizer. ? Change your dressing as told by your health care provider.  Leave  skin glue in place. These skin closures may need to stay in place for 2 weeks or longer.Check your port insertion site every day for signs of infection. Check for: ? Redness, swelling, or pain. ? Fluid or blood. ? Warmth. ? Pus or a bad smell. Activity  Return to your normal activities as told by your health care provider. Ask your health care provider what activities are safe for you.  Do not lift anything that is heavier than 10 lb (4.5 kg), or the limit that you are told, until your health care provider says that it is safe. General instructions  Take  over-the-counter and prescription medicines only as told by your health care provider.  Do not take baths, swim, or use a hot tub until your health care provider approves. You may shower tomorrow.  Do not drive for 24 hours if you were given a sedative during your procedure.  Wear a medical alert bracelet in case of an emergency. This will tell any health care providers that you have a port.  Keep all follow-up visits as told by your health care provider. This is important. Contact a health care provider if:  You cannot flush your port with saline as directed, or you cannot draw blood from the port.  You have a fever or chills.  You have redness, swelling, or pain around your port insertion site.  You have fluid or blood coming from your port insertion site.  Your port insertion site feels warm to the touch.  You have pus or a bad smell coming from the port insertion site. Get help right away if:  You have chest pain or shortness of breath.  You have bleeding from your port that you cannot control. Summary  Take care of the port as told by your health care provider. Keep the manufacturer's information card with you at all times.  Change your dressing as told by your health care provider.  Contact a health care provider if you have a fever or chills or if you have  redness, swelling, or pain around your port insertion site.  Keep all follow-up visits as told by your health care provider. This information is not intended to replace advice given to you by your health care provider. Make sure you discuss any questions you have with your health care provider. Document Revised: 07/22/2017 Document Reviewed: 07/22/2017 Elsevier Patient Education  [place].        Moderate Conscious Sedation, Adult, Care After These instructions provide you with information about caring for yourself after your procedure. Your health care provider may also give you more specific  instructions. Your treatment has been planned according to current medical practices, but problems sometimes occur. Call your health care provider if you have any problems or questions after your procedure. What can I expect after the procedure? After your procedure, it is common:  To feel sleepy for several hours.  To feel clumsy and have poor balance for several hours.  To have poor judgment for several hours.  To vomit if you eat too soon. Follow these instructions at home: For at least 24 hours after the procedure:   Do not: ? Participate in activities where you could fall or become injured. ? Drive. ? Use heavy machinery. ? Drink alcohol. ? Take sleeping pills or medicines that cause drowsiness. ? Make important decisions or sign legal documents. ? Take care of children on your own.  Rest. Eating and drinking  Follow the diet recommended by your health care provider.  If you vomit: ? Drink water, juice, or soup when you can drink without vomiting. ? Make sure you have little or no nausea before eating solid foods. General instructions  Have a responsible adult stay with you until you are awake and alert.  Take over-the-counter and prescription medicines only as told by your health care provider.  If you smoke, do not smoke without supervision.  Keep all follow-up visits as told by your health care provider. This is important. Contact a health care provider if:  You keep feeling nauseous or you keep vomiting.  You feel light-headed.  You develop a rash.  You have a fever. Get help right away if:  You have trouble breathing. This information is not intended to replace advice given to you by your health care provider. Make sure you discuss any questions you have with your health care provider. Document Revised: 12/06/2016 Document Reviewed: 04/15/2015 Elsevier Patient Education  2020 [employer].

## 2019-01-15 NOTE — Progress Notes (Signed)
INDICATION: lymphoma  Brief examination was performed. ENT: adequate airway clearance Heart: regular rate and rhythm.No Murmurs Lungs: clear to auscultation, no wheezes, normal respiratory effort  Bone Marrow Biopsy and Aspiration Procedure Note   Informed consent was obtained and potential risks including bleeding, infection and pain were reviewed with the patient.  The patient's name, date of birth, identification, consent and allergies were verified prior to the start of procedure and time out was performed.  The left posterior iliac crest was chosen as the site of biopsy.  The skin was prepped with ChloraPrep.   15 cc of 2% lidocaine was used to provide local anaesthesia.   10 cc of bone marrow aspirate was obtained followed by 1cm biopsy.  Pressure was applied to the biopsy site and bandage was placed over the biopsy site. Patient was made to lie on the back for 30 mins prior to discharge.  The procedure was tolerated well. COMPLICATIONS: None BLOOD LOSS: none The patient was discharged home in stable condition with a 1 week follow up to review results.  Patient was provided with post bone marrow biopsy instructions and instructed to call if there was any bleeding or worsening pain.  Specimens sent for flow cytometry, cytogenetics and additional studies.  Signed Scot Dock, NP

## 2019-01-15 NOTE — H&P (Signed)
Referring Physician(s): Mohamed,M  Supervising Physician: Aletta Edouard  Patient Status:  [facility] OP  Chief Complaint: "I'm getting a port a cath"   Subjective: Patient familiar to IR service from left external iliac lymph node biopsy on 01/06/2019.  She has a history of newly diagnosed non-Hodgkin's lymphoma/mantle cell lymphoma and presents again today for Port-A-Cath placement for chemotherapy.  She currently denies fever, headache, chest pain, dyspnea, cough, abdominal/back pain, nausea, vomiting or bleeding.  Past Medical History:  Diagnosis Date  . Allergy   . Cancer [facility])    being worked up for [place]  . Colon polyp 12/29/2006  . 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 Hyperlipidemia   . Hypothyroidism   . Low kidney function   . Obesity    BMI 34  . Osteoporosis   . Palpitations   . Vitamin D deficiency    Past Surgical History:  Procedure Laterality Date  . ABDOMINAL HYSTERECTOMY  1997  . CESAREAN SECTION  1983  . COLONOSCOPY W/ BIOPSIES  12/29/2006  . LAPAROSCOPY     1991 for emdometriosis  . UPPER GASTROINTESTINAL ENDOSCOPY      Allergies: Ppd [tuberculin purified protein derivative]  Medications: Prior to Admission medications   Medication Sig Start Date End Date Taking? Authorizing Provider  estradiol (VIVELLE-DOT) 0.05 MG/24HR patch Place 1 patch onto the skin 2 (two) times a week.   Yes [provider]  metaxalone (SKELAXIN) 800 MG tablet Take 1 tablet (800 mg total) by mouth 3 (three) times daily. 08/22/15  Yes Rolene Course, PA-C  NATURE-THROID 65 MG tablet  02/22/18  Yes [provider]  progesterone (PROMETRIUM) 100 MG capsule  01/19/18  Yes [provider]  b complex vitamins tablet Take 1 tablet by mouth daily.    [provider]  Biotin 1000 MCG CHEW Chew 5,000 mcg by mouth daily.    [provider]  cholecalciferol (VITAMIN D3) 25 MCG (1000 UT) tablet Take 10,000 Units by mouth daily.    [provider]  Coenzyme Q10 (COQ10 PO) Take 1 tablet by mouth 2 (two) times daily.    [provider]  Flaxseed, Linseed, (FLAX SEED OIL PO) Take 2 capsules by mouth 2 (two) times daily.    [provider]  lidocaine-prilocaine (EMLA) cream Apply 1 application topically as needed. 01/12/19   Heilingoetter, Cassandra L, PA-C  OVER THE COUNTER MEDICATION daily. Tumeric    [provider]  rosuvastatin (CRESTOR) 5 MG tablet Start by taking 1 tab by mouth at night once weekly; if tolerating, slowly increase frequency up to taking three nights a week (MWF). 08/19/18   Liane Comber, NP     Vital Signs: BP 137/84 (BP Location: Left Arm)   Pulse (!) 107   Temp 98.7 F (37.1 C) (Oral)   Resp 16   SpO2 98%   Physical Exam awake, alert.  Chest clear to auscultation bilaterally.  Heart with regular rate and rhythm.  Abdomen soft, positive bowel sounds, nontender.  Trace pretibial edema bilaterally.  Imaging: No results found.  Labs:  CBC: Recent Labs    01/06/19 0753 01/12/19 1134 01/15/19 0836 01/15/19 1208  WBC 7.0 6.9 6.1 7.7  HGB 14.0 14.2 12.6 13.3  HCT 44.4 44.3 39.7 42.6  PLT 201 220 189 202    COAGS: Recent Labs    01/06/19 0753  INR 0.9    BMP: Recent Labs    08/13/18 1601 12/21/18 1513 12/22/18 0345 01/12/19 1134  NA 138 140  137 142  K 4.3 4.3 3.9 4.4  CL 99 102 104 104  CO2 28 28 23 27   GLUCOSE 83 90 110* 96  BUN 17 12 13 12   CALCIUM 10.0 9.6 9.2 9.8  CREATININE 0.81 0.74 0.73 0.83  GFRNONAA 78 86 >60 >60  GFRAA 90 100 >60 >60    LIVER FUNCTION TESTS: Recent Labs    08/13/18 1601 12/21/18 1513 12/22/18 0345 01/12/19 1134  BILITOT 0.3 0.3 0.4 0.3  AST 20 21 27 24   ALT 22 21 26  35  ALKPHOS  --   --  70 84  PROT 7.6 7.1 7.2 7.6  ALBUMIN  --   --  4.1 4.3    Assessment and Plan: Pt with history of newly diagnosed non-Hodgkin's lymphoma/mantle cell lymphoma ; presents today for Port-A-Cath placement for  chemotherapy.Risks and benefits of image guided port-a-catheter placement was discussed with the patient including, but not limited to bleeding, infection, pneumothorax, or fibrin sheath development and need for additional procedures.  All of the patient's questions were answered, patient is agreeable to proceed. Consent signed and in chart.     Electronically Signed: D. Rowe Robert, PA-C 01/15/2019, 12:45 PM   I spent a total of 25 minutes at the the patient's bedside AND on the patient's hospital floor or unit, greater than 50% of which was counseling/coordinating care for Port-A-Cath placement

## 2019-01-15 NOTE — Progress Notes (Signed)
Patient tolerated bone marrow biopsy/aspiration well. Hemostasis at procedure site achieved with light direct pressure. Sterile gauze dressing applied and patient placed in supine position x 30 minutes. At completion of 30 min observation, dressing clean/dry/intact and patient verbalized no complaints. Ambulated to lobby with no issues.

## 2019-01-15 NOTE — Procedures (Signed)
Interventional Radiology Procedure Note  Procedure: Single Lumen Power Port Placement    Access:  Right IJ vein.  Findings: Catheter tip positioned at SVC/RA junction. Port is ready for immediate use.   Complications: None  EBL: < 10 mL  Recommendations:  - Ok to shower in 24 hours - Do not submerge for 7 days - Routine line care   Fiorella Hanahan T. Jashan Cotten, M.D Pager:  [phone]   

## 2019-01-18 ENCOUNTER — Other Ambulatory Visit: Payer: Self-pay

## 2019-01-19 LAB — SURGICAL PATHOLOGY

## 2019-01-19 NOTE — Progress Notes (Addendum)
[facility] OFFICE PROGRESS NOTE  [name], MD [address] [place] [address]  DIAGNOSIS: Stage IV non-Hodgkin's lymphoma, mantle cell lymphoma.  She presented with left iliac and retroperitoneal lymphadenopathy.  She also presented with left axillary lymphadenopathy and bone marrow involvement.  She was diagnosed in December 2020.  PRIOR THERAPY: None  CURRENT THERAPY: Systemic chemotherapy with R-CHOP with Neulasta support.  She is expected to start her first dose on 01/27/2019  INTERVAL HISTORY: Natalie Baker 64 y.o. female returns to clinic today for a follow-up visit accompanied by her daughter, [name].  The patient was recently diagnosed with mantle cell lymphoma pending further staging work-up with a bone marrow biopsy and aspirate.  The patient underwent this procedure last week.  Today, the patient is feeling well without any concerning complaints.  She denies any recent fever, chills, night sweats, weight loss, or lymphadenopathy.  She denies any chest pain, shortness of breath, cough, or hemoptysis.  She denies any nausea, vomiting, diarrhea, constipation, early satiety, or abdominal pain.  She denies any headache or visual changes.  She denies any rashes or skin changes.  The patient is here today for evaluation in for more detailed discussion regarding her current condition and treatment options.  MEDICAL HISTORY: Past Medical History:  Diagnosis Date  . Allergy   . Cancer [facility])    being worked up for [place]  . Colon polyp 12/29/2006  . 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 Hyperlipidemia   . Hypothyroidism   . Low kidney function   . Obesity    BMI 34  . Osteoporosis   . Palpitations   . Vitamin D deficiency     ALLERGIES:  is allergic to ppd [tuberculin purified protein derivative].  MEDICATIONS:  Current Outpatient Medications  Medication Sig Dispense Refill  . b complex vitamins tablet Take 1 tablet by mouth daily.    .  Biotin 1000 MCG CHEW Chew 5,000 mcg by mouth daily.    . cholecalciferol (VITAMIN D3) 25 MCG (1000 UT) tablet Take 10,000 Units by mouth daily.    . Coenzyme Q10 (COQ10 PO) Take 1 tablet by mouth 2 (two) times daily.    Marland Kitchen estradiol (VIVELLE-DOT) 0.05 MG/24HR patch Place 1 patch onto the skin 2 (two) times a week.    . Flaxseed, Linseed, (FLAX SEED OIL PO) Take 2 capsules by mouth 2 (two) times daily.    Marland Kitchen lidocaine-prilocaine (EMLA) cream Apply 1 application topically as needed. 30 g 0  . metaxalone (SKELAXIN) 800 MG tablet Take 1 tablet (800 mg total) by mouth 3 (three) times daily. 30 tablet 1  . NATURE-THROID 65 MG tablet     . OVER THE COUNTER MEDICATION daily. Tumeric    . progesterone (PROMETRIUM) 100 MG capsule     . rosuvastatin (CRESTOR) 5 MG tablet Start by taking 1 tab by mouth at night once weekly; if tolerating, slowly increase frequency up to taking three nights a week (MWF). 30 tablet 1  . allopurinol (ZYLOPRIM) 100 MG tablet Take 1 tablet (100 mg total) by mouth 2 (two) times daily. 60 tablet 3  . predniSONE (DELTASONE) 50 MG tablet Please take 2 tablets for 5 consecutive days starting on the first day of chemotherapy every cycle. 40 tablet 2  . prochlorperazine (COMPAZINE) 10 MG tablet Take 1 tablet (10 mg total) by mouth every 6 (six) hours as needed for nausea or vomiting. 30 tablet 0   No current facility-administered medications for this visit.  SURGICAL HISTORY:  Past Surgical History:  Procedure Laterality Date  . ABDOMINAL HYSTERECTOMY  1997  . CESAREAN SECTION  1983  . COLONOSCOPY W/ BIOPSIES  12/29/2006  . IR IMAGING GUIDED PORT INSERTION  01/15/2019  . LAPAROSCOPY     1991 for emdometriosis  . UPPER GASTROINTESTINAL ENDOSCOPY      REVIEW OF SYSTEMS:   Review of Systems  Constitutional: Negative for appetite change, chills, fatigue, fever and unexpected weight change.  HENT:   Negative for mouth sores, nosebleeds, sore throat and trouble swallowing.   Eyes:  Negative for eye problems and icterus.  Respiratory: Negative for cough, hemoptysis, shortness of breath and wheezing.  Cardiovascular: Negative for chest pain and leg swelling.  Gastrointestinal: Negative for abdominal pain, constipation, diarrhea, nausea and vomiting.  Genitourinary: Negative for bladder incontinence, difficulty urinating, dysuria, frequency and hematuria.   Musculoskeletal: Negative for back pain, gait problem, neck pain and neck stiffness.  Skin: Negative for itching and rash.  Neurological: Negative for dizziness, extremity weakness, gait problem, headaches, light-headedness and seizures.  Hematological: Negative for adenopathy. Does not bruise/bleed easily.  Psychiatric/Behavioral: Negative for confusion, depression and sleep disturbance. The patient is not nervous/anxious.     PHYSICAL EXAMINATION:  Blood pressure (!) 149/62, pulse 97, temperature 97.8 F (36.6 C), temperature source Temporal, resp. rate 19, height _0  (1.575 m), weight 199 lb 11.2 oz (90.6 kg), SpO2 96 %.  ECOG PERFORMANCE STATUS: 0 - Asymptomatic  Physical Exam  Constitutional: Oriented to person, place, and time and well-developed, well-nourished, and in no distress.  HENT:  Head: Normocephalic and atraumatic.  Mouth/Throat: Oropharynx is clear and moist. No oropharyngeal exudate.  Eyes: Conjunctivae are normal. Right eye exhibits no discharge. Left eye exhibits no discharge. No scleral icterus.  Neck: Normal range of motion. Neck supple.  Cardiovascular: Normal rate, regular rhythm, normal heart sounds and intact distal pulses.   Pulmonary/Chest: Effort normal and breath sounds normal. No respiratory distress. No wheezes. No rales.  Abdominal: Soft. Bowel sounds are normal. Exhibits no distension and no mass. There is no tenderness.  Musculoskeletal: Normal range of motion. Exhibits no edema.  Lymphadenopathy:    No cervical adenopathy.  Neurological: Alert and oriented to person, place,  and time. Exhibits normal muscle tone. Gait normal. Coordination normal.  Skin: Skin is warm and dry. No rash noted. Not diaphoretic. No erythema. No pallor.  Psychiatric: Mood, memory and judgment normal.  Vitals reviewed.  LABORATORY DATA: Lab Results  Component Value Date   WBC 7.7 01/15/2019   HGB 13.3 01/15/2019   HCT 42.6 01/15/2019   MCV 84.7 01/15/2019   PLT 202 01/15/2019      Chemistry      Component Value Date/Time   NA 142 01/12/2019 1134   K 4.4 01/12/2019 1134   CL 104 01/12/2019 1134   CO2 27 01/12/2019 1134   BUN 12 01/12/2019 1134   CREATININE 0.83 01/12/2019 1134   CREATININE 0.74 12/21/2018 1513      Component Value Date/Time   CALCIUM 9.8 01/12/2019 1134   ALKPHOS 84 01/12/2019 1134   AST 24 01/12/2019 1134   ALT 35 01/12/2019 1134   BILITOT 0.3 01/12/2019 1134       RADIOGRAPHIC STUDIES:  CT Angio Chest PE W and/or Wo Contrast  Result Date: 12/22/2018 CLINICAL DATA:  Left upper quadrant abdominal pain. Shortness of breath. EXAM: CT ANGIOGRAPHY CHEST CT ABDOMEN AND PELVIS WITH CONTRAST TECHNIQUE: Multidetector CT imaging of the chest was performed using the standard  protocol during bolus administration of intravenous contrast. Multiplanar CT image reconstructions and MIPs were obtained to evaluate the vascular anatomy. Multidetector CT imaging of the abdomen and pelvis was performed using the standard protocol during bolus administration of intravenous contrast. CONTRAST:  183m OMNIPAQUE IOHEXOL 350 MG/ML SOLN COMPARISON:  None. FINDINGS: CTA CHEST FINDINGS Cardiovascular: There are no filling defects within the pulmonary arteries to suggest pulmonary embolus. No aortic dissection or aneurysm. Heart is normal in size. No pericardial effusion. Mediastinum/Nodes: Enlarged left supraclavicular nodes measuring up to 15 mm short axis, series 4, image 7. Prominent subcarinal node measuring 10 mm. Prominent bilateral axillary nodes measuring 10 mm on the right  and 11 mm on the left. There is a prominent left epicardial node measuring 10 mm. No hilar adenopathy. Decompressed esophagus. No thyroid nodule. Prominent right epicardial fat pad. Lungs/Pleura: No acute airspace disease. No pleural fluid. No evidence pulmonary edema. Mild lower lobe bronchial thickening. Compressive atelectasis in the right lower lobe related to mild diaphragm elevation. Trachea and mainstem bronchi are patent. 3 mm pleural-based nodule in the right lower lobe series 6, image 53. Trace left pleural thickening without effusion. Musculoskeletal: Bone island in right anterior rib. There are no acute or suspicious osseous abnormalities. Review of the MIP images confirms the above findings. CT ABDOMEN and PELVIS FINDINGS Hepatobiliary: Elongated left lobe of the liver wrapping into the left upper quadrant. No focal liver lesion. Minimal high density within the gallbladder neck may be stones or sludge. No pericholecystic inflammation. No biliary dilatation. Pancreas: No ductal dilatation or inflammation. Spleen: Normal in size, greatest splenic dimension 11.8 cm AP. No focal abnormality. Adrenals/Urinary Tract: No adrenal nodule. No hydronephrosis or perinephric edema. Homogeneous renal enhancement with symmetric excretion on delayed phase imaging. Subcentimeter low-density in the lower left kidney is too small to characterize but likely small cyst. Additional parapelvic cysts in the lower left kidney. Urinary bladder is nondistended. Stomach/Bowel: Nondistended stomach without gastric wall thickening. No small bowel obstruction or inflammation. Normal appendix, series 4, image 43 moderate volume of stool in the proximal colon. Small volume of stool distally. Vascular/Lymphatic: Retroperitoneal adenopathy with left periaortic node measuring 17 mm short axis, series 2, image 30 just below the left renal vein. Possible additional lymph node anterior left periaortic station measuring 19 mm, however  difficult to differentiate from adjacent small bowel. Multiple additional prominent retroperitoneal nodes extend from the SMA to the iliac bifurcation. Porta hepatis adenopathy with enlarged node measuring 14 mm. Portal caval node measures 12 mm. Enlarged ileocolic nodes, largest measuring 19 mm, series 2, image 53. Additional prominent central mesenteric lymph nodes. Increased number of prominent bilateral external iliac nodes. No inguinal adenopathy. Mild aortic atherosclerosis. No aneurysm. Patent portal vein. Reproductive: Status post hysterectomy. No adnexal masses. Other: No ascites. No free air. Musculoskeletal: There are no acute or suspicious osseous abnormalities. Review of the MIP images confirms the above findings. IMPRESSION: 1. No pulmonary embolus or acute intrathoracic abnormality. 2. Multifocal mild adenopathy in the chest and abdomen, including left supraclavicular, axillary, epicardial, retroperitoneal, porta hepatis and ileocolic. Largest lymph node measures 19 mm greatest dimension in the retroperitoneum. Overall appearance is suspicious for lymphoproliferative disorder such as lymphoma. Recommend clinical/laboratory evaluation, consider oncologic referral. Metastatic disease is also considered, however multifocal nature argues against this. 3. Possible gallstone or sludge in the gallbladder. Aortic Atherosclerosis (ICD10-I70.0). Electronically Signed   By: MKeith RakeM.D.   On: 12/22/2018 06:37   CT ABDOMEN PELVIS W CONTRAST  Result Date: 12/22/2018  CLINICAL DATA:  Left upper quadrant abdominal pain. Shortness of breath. EXAM: CT ANGIOGRAPHY CHEST CT ABDOMEN AND PELVIS WITH CONTRAST TECHNIQUE: Multidetector CT imaging of the chest was performed using the standard protocol during bolus administration of intravenous contrast. Multiplanar CT image reconstructions and MIPs were obtained to evaluate the vascular anatomy. Multidetector CT imaging of the abdomen and pelvis was performed  using the standard protocol during bolus administration of intravenous contrast. CONTRAST:  120m OMNIPAQUE IOHEXOL 350 MG/ML SOLN COMPARISON:  None. FINDINGS: CTA CHEST FINDINGS Cardiovascular: There are no filling defects within the pulmonary arteries to suggest pulmonary embolus. No aortic dissection or aneurysm. Heart is normal in size. No pericardial effusion. Mediastinum/Nodes: Enlarged left supraclavicular nodes measuring up to 15 mm short axis, series 4, image 7. Prominent subcarinal node measuring 10 mm. Prominent bilateral axillary nodes measuring 10 mm on the right and 11 mm on the left. There is a prominent left epicardial node measuring 10 mm. No hilar adenopathy. Decompressed esophagus. No thyroid nodule. Prominent right epicardial fat pad. Lungs/Pleura: No acute airspace disease. No pleural fluid. No evidence pulmonary edema. Mild lower lobe bronchial thickening. Compressive atelectasis in the right lower lobe related to mild diaphragm elevation. Trachea and mainstem bronchi are patent. 3 mm pleural-based nodule in the right lower lobe series 6, image 53. Trace left pleural thickening without effusion. Musculoskeletal: Bone island in right anterior rib. There are no acute or suspicious osseous abnormalities. Review of the MIP images confirms the above findings. CT ABDOMEN and PELVIS FINDINGS Hepatobiliary: Elongated left lobe of the liver wrapping into the left upper quadrant. No focal liver lesion. Minimal high density within the gallbladder neck may be stones or sludge. No pericholecystic inflammation. No biliary dilatation. Pancreas: No ductal dilatation or inflammation. Spleen: Normal in size, greatest splenic dimension 11.8 cm AP. No focal abnormality. Adrenals/Urinary Tract: No adrenal nodule. No hydronephrosis or perinephric edema. Homogeneous renal enhancement with symmetric excretion on delayed phase imaging. Subcentimeter low-density in the lower left kidney is too small to characterize but  likely small cyst. Additional parapelvic cysts in the lower left kidney. Urinary bladder is nondistended. Stomach/Bowel: Nondistended stomach without gastric wall thickening. No small bowel obstruction or inflammation. Normal appendix, series 4, image 43 moderate volume of stool in the proximal colon. Small volume of stool distally. Vascular/Lymphatic: Retroperitoneal adenopathy with left periaortic node measuring 17 mm short axis, series 2, image 30 just below the left renal vein. Possible additional lymph node anterior left periaortic station measuring 19 mm, however difficult to differentiate from adjacent small bowel. Multiple additional prominent retroperitoneal nodes extend from the SMA to the iliac bifurcation. Porta hepatis adenopathy with enlarged node measuring 14 mm. Portal caval node measures 12 mm. Enlarged ileocolic nodes, largest measuring 19 mm, series 2, image 53. Additional prominent central mesenteric lymph nodes. Increased number of prominent bilateral external iliac nodes. No inguinal adenopathy. Mild aortic atherosclerosis. No aneurysm. Patent portal vein. Reproductive: Status post hysterectomy. No adnexal masses. Other: No ascites. No free air. Musculoskeletal: There are no acute or suspicious osseous abnormalities. Review of the MIP images confirms the above findings. IMPRESSION: 1. No pulmonary embolus or acute intrathoracic abnormality. 2. Multifocal mild adenopathy in the chest and abdomen, including left supraclavicular, axillary, epicardial, retroperitoneal, porta hepatis and ileocolic. Largest lymph node measures 19 mm greatest dimension in the retroperitoneum. Overall appearance is suspicious for lymphoproliferative disorder such as lymphoma. Recommend clinical/laboratory evaluation, consider oncologic referral. Metastatic disease is also considered, however multifocal nature argues against this. 3. Possible gallstone  or sludge in the gallbladder. Aortic Atherosclerosis (ICD10-I70.0).  Electronically Signed   By: Keith Rake M.D.   On: 12/22/2018 06:37   NM PET Image Restag (PS) Skull Base To Thigh  Result Date: 12/29/2018 CLINICAL DATA:  Initial treatment strategy for lymphadenopathy. EXAM: NUCLEAR MEDICINE PET SKULL BASE TO THIGH TECHNIQUE: 10.0 mCi F-18 FDG was injected intravenously. Full-ring PET imaging was performed from the skull base to thigh after the radiotracer. CT data was obtained and used for attenuation correction and anatomic localization. Fasting blood glucose: 102 mg/dl COMPARISON:  CT scan 12/22/2018 FINDINGS: Mediastinal blood pool activity: SUV max 2.9 Liver activity: SUV max 4.2 NECK: No hypermetabolic lymph nodes in the neck. Incidental CT findings: none CHEST: Mild metabolic activity within the axillary lymph nodes similar to background blood pool activity and less than liver activity. Example LEFT axillary node measures 9 mm short axis with SUV max 3.0 No hypermetabolic mediastinal nodes.  No enlarged mediastinal nodes Nodule in the RIGHT middle lobe measures 6 mm (68/3). No associated metabolic activity. There is atelectasis in the more peripheral RIGHT middle lobe also without metabolic Incidental CT findings: none ABDOMEN/PELVIS: enlarged retroperitoneal lymph node adjacent to the SMA measures 20 mm short axis and with moderate metabolic activity (SUV max equal 5.0) Enlarged LEFTinternal iliac lymph node measures 12 mm (image 133/3) with (SUV max equal 5.5) There is enlarged LEFT external iliac lymph node measuring 19 mm on image 153/3 with SUV max equal 5.1. No enlarged hypermetabolic inguinal nodes. No central mesenteric nodes. There is a focus of metabolic activity along the inferior margin of the spleen SUV max equal 5.5 (image 107 fused data set.) No clear splenic lesion associated with this activity. Incidental CT findings: SKELETON: No focal hypermetabolic activity to suggest skeletal metastasis. Incidental CT findings: none IMPRESSION: 1.  Hypermetabolic retroperitoneal and LEFT iliac lymphadenopathy consistent with lymphoma. 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 Activity is slightly above liver activity. Granulomatous disease could also have this pattern but lymphoproliferative disease favored. 2. Mild activity associated with prominent LEFT axillary lymph nodes may also represent lymphoma. 3. Focal activity along the inferior margin of the spleen is indeterminate. No lesion seen on comparison contrast CT 4. No evidence of marrow involvement with lymphoproliferative process. 5. RIGHT middle lobe pulmonary nodule. Recommend attention on follow-up. 6. Fortissue sampling planning, the most assessable lymph node may be the LEFT external iliac node. LEFT axillary nodes are assessable but metabolic activity is significantly less. Electronically Signed   By: Suzy Bouchard M.D.   On: 12/29/2018 10:19   CT Biopsy  Result Date: 01/06/2019 CLINICAL DATA:  Hypermetabolic retroperitoneal and left iliac lymphadenopathy. No known primary. EXAM: CT GUIDED CORE BIOPSY OF LEFT EXTERNAL ILIAC ADENOPATHY ANESTHESIA/SEDATION: Intravenous Fentanyl 91mg and Versed '100mg'$  were administered as conscious sedation during continuous monitoring of the patient's level of consciousness and physiological / cardiorespiratory status by the radiology RN, with a total moderate sedation time of 15 minutes. PROCEDURE: The procedure risks, benefits, and alternatives were explained to the patient. Questions regarding the procedure were encouraged and answered. The patient understands and consents to the procedure. Select axial scans through the pelvis were obtained. The left external iliac adenopathy was localized and an appropriate skin entry site was determined and marked. The operative field was prepped with chlorhexidinein a sterile fashion, and a sterile drape was applied covering the operative field. A sterile gown and sterile gloves were used  for the procedure. Local anesthesia was provided with 1% Lidocaine. Under  CT fluoroscopic guidance, a 17 gauge trocar needle was advanced to the margin of the lesion. Once needle tip position was confirmed, coaxial 18-gauge core biopsy samples were obtained, submitted in saline to surgical pathology. The guide needle was removed. Postprocedure scans show no hemorrhage or other apparent complication. The patient tolerated the procedure well. COMPLICATIONS: None immediate FINDINGS: Left external iliac adenopathy was localized. Representative core biopsy samples obtained and submitted in saline to surgical pathology. IMPRESSION: 1. Technically successful CT-guided core biopsy, left external iliac adenopathy. Electronically Signed   By: Lucrezia Europe M.D.   On: 01/06/2019 16:42   IR IMAGING GUIDED PORT INSERTION  Result Date: 01/15/2019 CLINICAL DATA:  Mantle cell lymphoma and need for porta cath to begin chemotherapy. EXAM: IMPLANTED PORT A CATH PLACEMENT WITH ULTRASOUND AND FLUOROSCOPIC GUIDANCE ANESTHESIA/SEDATION: 2.0 mg IV Versed; 100 mcg IV Fentanyl Total Moderate Sedation Time:  34 minutes The patient's level of consciousness and physiologic status were continuously monitored during the procedure by Radiology nursing. Additional Medications: 2 g IV Ancef. FLUOROSCOPY TIME:  30 seconds.  9.1 mGy. PROCEDURE: The procedure, risks, benefits, and alternatives were explained to the patient. Questions regarding the procedure were encouraged and answered. The patient understands and consents to the procedure. A time-out was performed prior to initiating the procedure. Ultrasound was utilized to confirm patency of the right internal jugular vein. The right neck and chest were prepped with chlorhexidine in a sterile fashion, and a sterile drape was applied covering the operative field. Maximum barrier sterile technique with sterile gowns and gloves were used for the procedure. Local anesthesia was provided with 1%  lidocaine. After creating a small venotomy incision, a 21 gauge needle was advanced into the right internal jugular vein under direct, real-time ultrasound guidance. Ultrasound image documentation was performed. After securing guidewire access, an 8 Fr dilator was placed. A J-wire was kinked to measure appropriate catheter length. A subcutaneous port pocket was then created along the upper chest wall utilizing sharp and blunt dissection. Portable cautery was utilized. The pocket was irrigated with sterile saline. A single lumen power injectable port was chosen for placement. The 8 Fr catheter was tunneled from the port pocket site to the venotomy incision. The port was placed in the pocket. External catheter was trimmed to appropriate length based on guidewire measurement. At the venotomy, an 8 Fr peel-away sheath was placed over a guidewire. The catheter was then placed through the sheath and the sheath removed. Final catheter positioning was confirmed and documented with a fluoroscopic spot image. The port was accessed with a needle and aspirated and flushed with heparinized saline. The access needle was removed. The venotomy and port pocket incisions were closed with subcutaneous 3-0 Monocryl and subcuticular 4-0 Vicryl. Dermabond was applied to both incisions. COMPLICATIONS: COMPLICATIONS None FINDINGS: After catheter placement, the tip lies at the cavo-atrial junction. The catheter aspirates normally and is ready for immediate use. IMPRESSION: Placement of single lumen port a cath via right internal jugular vein. The catheter tip lies at the cavo-atrial junction. A power injectable port a cath was placed and is ready for immediate use. Electronically Signed   By: Aletta Edouard M.D.   On: 01/15/2019 14:51     ASSESSMENT/PLAN:  This is a very pleasant 64 year old Caucasian female recently diagnosed with stage IV non-Hodgkin's lymphoma, mantle cell lymphoma.  She presented with left iliac, axillary, and  retroperitoneal lymphadenopathy.  She also presented with  bone marrow involvement.  She was diagnosed in December 2020.  The patient recently underwent a bone marrow biopsy and aspirate.  The final pathology was consistent with bone marrow involvement.   The patient was seen with Dr. Julien Nordmann today.  Dr. Julien Nordmann had a lengthy discussion today with the patient and her daughter about her current condition and treatment options.  Dr. Julien Nordmann recommends that the patient undergo treatment with R-CHOP IV every 3 weeks with neulasta support. If the patient has a good response to treatment she will be referred for an autologous stem cell transplant at [facility].  The patient is interested in this option and she is expected to start her first dose next week.  The adverse side effects of treatment were discussed including but not limited to alopecia, myelosuppression, nausea, vomiting, cardiac dysfunction, peripheral neuropathy, renal and liver dysfunction.  I will arrange for the patient to have a chemo education class prior to receiving her first cycle of treatment next week.  I sent several prescriptions to the patient's pharmacy.  I have sent a prescription for Compazine 10 mg p.o. every 6 hours as needed for nausea.  I have also sent 100 mg p.o. twice daily of allopurinol to the patient's pharmacy.  I have also sent a prescription for prednisone 100 mg for 5 consecutive days on day 1 of every cycle of chemotherapy.  We will see the patient back for follow-up visit in 2 weeks for evaluation and for 1 week follow-up visit after completing her first cycle of chemotherapy.  She will have her 2D echocardiogram of the heart performed later this week as scheduled for her baseline cardiac function.   The patient was advised to call immediately if she has any concerning symptoms in the interval. The patient voices understanding of current disease status and treatment options and is in agreement with the  current care plan. All questions were answered. The patient knows to call the clinic with any problems, questions or concerns. We can certainly see the patient much sooner if necessary   Orders Placed This Encounter  Procedures  . CMP ([facility] only)    Standing Status:   Standing    Number of Occurrences:   18    Standing Expiration Date:   01/20/2020  . CBC with Differential (Cancer Center Only)    Standing Status:   Standing    Number of Occurrences:   18    Standing Expiration Date:   01/20/2020  . Lactate dehydrogenase (LDH)    Standing Status:   Standing    Number of Occurrences:   18    Standing Expiration Date:   01/20/2020  . Uric acid    Standing Status:   Standing    Number of Occurrences:   18    Standing Expiration Date:   01/20/2020     Joleah Kosak L Baden Betsch, PA-C 01/20/19  ADDENDUM: Hematology/Oncology Attending: I had a face-to-face encounter with the patient today.  I recommended her care plan.  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  The patient was in the clinic today accompanied by her daughter.  She had a bone marrow biopsy and aspirate that showed involvement of her bone marrow with the mantle cell lymphoma.  I spoke to the pathologist Dr. Tresa Moore and she indicated that the patient does not have the aggressive form of mantle cell lymphoma but also this is not indolent. I had a lengthy discussion with the patient and her daughter today about her  current condition and treatment options.  I gave the patient the option of proceeding with systemic chemotherapy with R-CHOP regimen.  This is not the most aggressive approach for her condition but also because she does not have aggressive mantle cell lymphoma. We will consider the patient for treatment with R-CHOP every 3 weeks for 6 cycles. After completion of her treatment we may consider referring the patient to [facility] stem cell  transplant team for evaluation and further recommendation regarding her condition. I discussed with the patient the adverse effect of the chemotherapy including but not limited to alopecia, myelosuppression, nausea and vomiting, peripheral neuropathy, liver or renal dysfunction. She is expected to start the first cycle of this treatment next week. We will arrange for the patient to have a chemotherapy education class before the first dose of her treatment. We will start the patient on allopurinol for tumor lysis prophylaxis and and we will give her Compazine for nausea. The patient will come back for follow-up visit in 2 weeks for evaluation and management of any adverse effect of her treatment. She was advised to call immediately if she has any concerning symptoms in the interval.  Disclaimer: This note was dictated with voice recognition software. Similar sounding words can inadvertently be transcribed and may be missed upon review. Eilleen Kempf, MD 01/20/19

## 2019-01-20 ENCOUNTER — Encounter: Payer: Self-pay | Admitting: Physician Assistant

## 2019-01-20 ENCOUNTER — Inpatient Hospital Stay (HOSPITAL_BASED_OUTPATIENT_CLINIC_OR_DEPARTMENT_OTHER): Payer: BC Managed Care – PPO | Admitting: Physician Assistant

## 2019-01-20 ENCOUNTER — Other Ambulatory Visit: Payer: Self-pay

## 2019-01-20 ENCOUNTER — Other Ambulatory Visit: Payer: Self-pay | Admitting: Internal Medicine

## 2019-01-20 VITALS — BP 149/62 | HR 97 | Temp 97.8°F | Resp 19 | Ht 62.0 in | Wt 199.7 lb

## 2019-01-20 DIAGNOSIS — Z452 Encounter for adjustment and management of vascular access device: Secondary | ICD-10-CM | POA: Diagnosis not present

## 2019-01-20 DIAGNOSIS — Z23 Encounter for immunization: Secondary | ICD-10-CM | POA: Diagnosis not present

## 2019-01-20 DIAGNOSIS — M2550 Pain in unspecified joint: Secondary | ICD-10-CM | POA: Diagnosis not present

## 2019-01-20 DIAGNOSIS — Z5111 Encounter for antineoplastic chemotherapy: Secondary | ICD-10-CM | POA: Diagnosis not present

## 2019-01-20 DIAGNOSIS — Z5189 Encounter for other specified aftercare: Secondary | ICD-10-CM | POA: Diagnosis not present

## 2019-01-20 DIAGNOSIS — E039 Hypothyroidism, unspecified: Secondary | ICD-10-CM | POA: Diagnosis not present

## 2019-01-20 DIAGNOSIS — Z5112 Encounter for antineoplastic immunotherapy: Secondary | ICD-10-CM | POA: Diagnosis not present

## 2019-01-20 DIAGNOSIS — C8318 Mantle cell lymphoma, lymph nodes of multiple sites: Secondary | ICD-10-CM | POA: Diagnosis not present

## 2019-01-20 DIAGNOSIS — Z7189 Other specified counseling: Secondary | ICD-10-CM | POA: Diagnosis not present

## 2019-01-20 DIAGNOSIS — R112 Nausea with vomiting, unspecified: Secondary | ICD-10-CM | POA: Diagnosis not present

## 2019-01-20 DIAGNOSIS — C8310 Mantle cell lymphoma, unspecified site: Secondary | ICD-10-CM | POA: Diagnosis not present

## 2019-01-20 MED ORDER — PROCHLORPERAZINE MALEATE 10 MG PO TABS: 10.0000 mg | ORAL_TABLET | Freq: Four times a day (QID) | ORAL | 0 refills | Status: DC | PRN

## 2019-01-20 MED ORDER — PREDNISONE 50 MG PO TABS: ORAL_TABLET | ORAL | 2 refills | Status: DC

## 2019-01-20 MED ORDER — ALLOPURINOL 100 MG PO TABS: 100.0000 mg | ORAL_TABLET | Freq: Two times a day (BID) | ORAL | 3 refills | Status: DC

## 2019-01-20 NOTE — Patient Instructions (Addendum)
-The treatment is called R-CHOP. This is chemotherapy. We will arrange for you to have a chemoeducation class. You will sit down one on one with one of our nurses who will discuss in more details with you about treatment and give you important information about the cancer center.  -Will will give you a shot ~2 days after chemotherapy to boost your infection fighting cells to protect you from infections -Plan for 6 rounds of chemotherapy. Then we will referral you to [facility] to the transplant team to see if they think you will be a canidate for a tranplant -I sent several prescriptions to the pharmacy. I sent compazine 10 mg every 6 hours as needed for nausea. I sent 50 mg of prednisone to your pharmacy to take 2 tablets starting the day of chemotherapy and for the following 5 days. Then I sent allopurinol 100 mg BID.  -We will see you back for a follow up visit in about 2 weeks     Rituximab injection What is this medicine? RITUXIMAB (ri TUX i mab) is a monoclonal antibody. It is used to treat certain types of cancer like non-Hodgkin lymphoma and chronic lymphocytic leukemia. It is also used to treat rheumatoid arthritis, granulomatosis with polyangiitis (or Wegener's granulomatosis), microscopic polyangiitis, and pemphigus vulgaris. This medicine may be used for other purposes; ask your health care provider or pharmacist if you have questions. COMMON BRAND NAME(S): Rituxan, RUXIENCE What should I tell my health care provider before I take this medicine? They need to know if you have any of these conditions:  heart disease  infection (especially a virus infection such as hepatitis B, chickenpox, cold sores, or herpes)  immune system problems  irregular heartbeat  kidney disease  low blood counts, like low white cell, platelet, or red cell counts  lung or breathing disease, like asthma  recently received or scheduled to receive a vaccine  an unusual or allergic reaction to  rituximab, other medicines, foods, dyes, or preservatives  pregnant or trying to get pregnant  breast-feeding How should I use this medicine? This medicine is for infusion into a vein. It is administered in a hospital or clinic by a specially trained health care professional. A special MedGuide will be given to you by the pharmacist with each prescription and refill. Be sure to read this information carefully each time. Talk to your pediatrician regarding the use of this medicine in children. This medicine is not approved for use in children. Overdosage: If you think you have taken too much of this medicine contact a poison control center or emergency room at once. NOTE: This medicine is only for you. Do not share this medicine with others. What if I miss a dose? It is important not to miss a dose. Call your doctor or health care professional if you are unable to keep an appointment. What may interact with this medicine?  cisplatin  live virus vaccines This list may not describe all possible interactions. Give your health care provider a list of all the medicines, herbs, non-prescription drugs, or dietary supplements you use. Also tell them if you smoke, drink alcohol, or use illegal drugs. Some items may interact with your medicine. What should I watch for while using this medicine? Your condition will be monitored carefully while you are receiving this medicine. You may need blood work done while you are taking this medicine. This medicine can cause serious allergic reactions. To reduce your risk you may need to take medicine before treatment  with this medicine. Take your medicine as directed. In some patients, this medicine may cause a serious brain infection that may cause death. If you have any problems seeing, thinking, speaking, walking, or standing, tell your healthcare professional right away. If you cannot reach your healthcare professional, urgently seek other source of medical  care. Call your doctor or health care professional for advice if you get a fever, chills or sore throat, or other symptoms of a cold or flu. Do not treat yourself. This drug decreases your body's ability to fight infections. Try to avoid being around people who are sick. Do not become pregnant while taking this medicine or for at least 12 months after stopping it. Women should inform their doctor if they wish to become pregnant or think they might be pregnant. There is a potential for serious side effects to an unborn child. Talk to your health care professional or pharmacist for more information. Do not breast-feed an infant while taking this medicine or for at least 6 months after stopping it. What side effects may I notice from receiving this medicine? Side effects that you should report to your doctor or health care professional as soon as possible:  allergic reactions like skin rash, itching or hives; swelling of the face, lips, or tongue  breathing problems  chest pain  changes in vision  diarrhea  headache with fever, neck stiffness, sensitivity to light, nausea, or confusion  fast, irregular heartbeat  loss of memory  low blood counts - this medicine may decrease the number of white blood cells, red blood cells and platelets. You may be at increased risk for infections and bleeding.  mouth sores  problems with balance, talking, or walking  redness, blistering, peeling or loosening of the skin, including inside the mouth  signs of infection - fever or chills, cough, sore throat, pain or difficulty passing urine  signs and symptoms of kidney injury like trouble passing urine or change in the amount of urine  signs and symptoms of liver injury like dark yellow or brown urine; general ill feeling or flu-like symptoms; light-colored stools; loss of appetite; nausea; right upper belly pain; unusually weak or tired; yellowing of the eyes or skin  signs and symptoms of low blood  pressure like dizziness; feeling faint or lightheaded, falls; unusually weak or tired  stomach pain  swelling of the ankles, feet, hands  unusual bleeding or bruising  vomiting Side effects that usually do not require medical attention (report to your doctor or health care professional if they continue or are bothersome):  headache  joint pain  muscle cramps or muscle pain  nausea  tiredness This list may not describe all possible side effects. Call your doctor for medical advice about side effects. You may report side effects to FDA at 1-800-FDA-1088. Where should I keep my medicine? This drug is given in a hospital or clinic and will not be stored at home. NOTE: This sheet is a summary. It may not cover all possible information. If you have questions about this medicine, talk to your doctor, pharmacist, or health care provider.  2020 Elsevier/Gold Standard (2018-02-04 22:01:36) Doxorubicin injection What is this medicine? DOXORUBICIN (dox oh ROO bi sin) is a chemotherapy drug. It is used to treat many kinds of cancer like leukemia, lymphoma, neuroblastoma, sarcoma, and Wilms' tumor. It is also used to treat bladder cancer, breast cancer, lung cancer, ovarian cancer, stomach cancer, and thyroid cancer. This medicine may be used for other purposes; ask your  health care provider or pharmacist if you have questions. COMMON BRAND NAME(S): Adriamycin, Adriamycin PFS, Adriamycin RDF, Rubex What should I tell my health care provider before I take this medicine? They need to know if you have any of these conditions:  heart disease  history of low blood counts caused by a medicine  liver disease  recent or ongoing radiation therapy  an unusual or allergic reaction to doxorubicin, other chemotherapy agents, other medicines, foods, dyes, or preservatives  pregnant or trying to get pregnant  breast-feeding How should I use this medicine? This drug is given as an infusion into a  vein. It is administered in a hospital or clinic by a specially trained health care professional. If you have pain, swelling, burning or any unusual feeling around the site of your injection, tell your health care professional right away. Talk to your pediatrician regarding the use of this medicine in children. Special care may be needed. Overdosage: If you think you have taken too much of this medicine contact a poison control center or emergency room at once. NOTE: This medicine is only for you. Do not share this medicine with others. What if I miss a dose? It is important not to miss your dose. Call your doctor or health care professional if you are unable to keep an appointment. What may interact with this medicine? This medicine may interact with the following medications:  6-mercaptopurine  paclitaxel  phenytoin  St. John's Wort  trastuzumab  verapamil This list may not describe all possible interactions. Give your health care provider a list of all the medicines, herbs, non-prescription drugs, or dietary supplements you use. Also tell them if you smoke, drink alcohol, or use illegal drugs. Some items may interact with your medicine. What should I watch for while using this medicine? This drug may make you feel generally unwell. This is not uncommon, as chemotherapy can affect healthy cells as well as cancer cells. Report any side effects. Continue your course of treatment even though you feel ill unless your doctor tells you to stop. There is a maximum amount of this medicine you should receive throughout your life. The amount depends on the medical condition being treated and your overall health. Your doctor will watch how much of this medicine you receive in your lifetime. Tell your doctor if you have taken this medicine before. You may need blood work done while you are taking this medicine. Your urine may turn red for a few days after your dose. This is not blood. If your urine is  dark or brown, call your doctor. In some cases, you may be given additional medicines to help with side effects. Follow all directions for their use. Call your doctor or health care professional for advice if you get a fever, chills or sore throat, or other symptoms of a cold or flu. Do not treat yourself. This drug decreases your body's ability to fight infections. Try to avoid being around people who are sick. This medicine may increase your risk to bruise or bleed. Call your doctor or health care professional if you notice any unusual bleeding. Talk to your doctor about your risk of cancer. You may be more at risk for certain types of cancers if you take this medicine. Do not become pregnant while taking this medicine or for 6 months after stopping it. Women should inform their doctor if they wish to become pregnant or think they might be pregnant. Men should not father a child while taking  this medicine and for 6 months after stopping it. There is a potential for serious side effects to an unborn child. Talk to your health care professional or pharmacist for more information. Do not breast-feed an infant while taking this medicine. This medicine has caused ovarian failure in some women and reduced sperm counts in some men This medicine may interfere with the ability to have a child. Talk with your doctor or health care professional if you are concerned about your fertility. This medicine may cause a decrease in Co-Enzyme Q-10. You should make sure that you get enough Co-Enzyme Q-10 while you are taking this medicine. Discuss the foods you eat and the vitamins you take with your health care professional. What side effects may I notice from receiving this medicine? Side effects that you should report to your doctor or health care professional as soon as possible:  allergic reactions like skin rash, itching or hives, swelling of the face, lips, or tongue  breathing problems  chest pain  fast or  irregular heartbeat  low blood counts - this medicine may decrease the number of white blood cells, red blood cells and platelets. You may be at increased risk for infections and bleeding.  pain, redness, or irritation at site where injected  signs of infection - fever or chills, cough, sore throat, pain or difficulty passing urine  signs of decreased platelets or bleeding - bruising, pinpoint red spots on the skin, black, tarry stools, blood in the urine  swelling of the ankles, feet, hands  tiredness  weakness Side effects that usually do not require medical attention (report to your doctor or health care professional if they continue or are bothersome):  diarrhea  hair loss  mouth sores  nail discoloration or damage  nausea  red colored urine  vomiting This list may not describe all possible side effects. Call your doctor for medical advice about side effects. You may report side effects to FDA at 1-800-FDA-1088. Where should I keep my medicine? This drug is given in a hospital or clinic and will not be stored at home. NOTE: This sheet is a summary. It may not cover all possible information. If you have questions about this medicine, talk to your doctor, pharmacist, or health care provider.  2020 Elsevier/Gold Standard (2016-08-07 11:01:26) Cyclophosphamide Injection What is this medicine? CYCLOPHOSPHAMIDE (sye kloe FOSS fa mide) is a chemotherapy drug. It slows the growth of cancer cells. This medicine is used to treat many types of cancer like lymphoma, myeloma, leukemia, breast cancer, and ovarian cancer, to name a few. This medicine may be used for other purposes; ask your health care provider or pharmacist if you have questions. COMMON BRAND NAME(S): Cytoxan, Neosar What should I tell my health care provider before I take this medicine? They need to know if you have any of these conditions:  heart disease  history of irregular heartbeat  infection  kidney  disease  liver disease  low blood counts, like white cells, platelets, or red blood cells  on hemodialysis  recent or ongoing radiation therapy  scarring or thickening of the lungs  trouble passing urine  an unusual or allergic reaction to cyclophosphamide, other medicines, foods, dyes, or preservatives  pregnant or trying to get pregnant  breast-feeding How should I use this medicine? This drug is usually given as an injection into a vein or muscle or by infusion into a vein. It is administered in a hospital or clinic by a specially trained health care professional.  Talk to your pediatrician regarding the use of this medicine in children. Special care may be needed. Overdosage: If you think you have taken too much of this medicine contact a poison control center or emergency room at once. NOTE: This medicine is only for you. Do not share this medicine with others. What if I miss a dose? It is important not to miss your dose. Call your doctor or health care professional if you are unable to keep an appointment. What may interact with this medicine?  amphotericin B  azathioprine  certain antivirals for HIV or hepatitis  certain medicines for blood pressure, heart disease, irregular heart beat  certain medicines that treat or prevent blood clots like warfarin  certain other medicines for cancer  cyclosporine  etanercept  indomethacin  medicines that relax muscles for surgery  medicines to increase blood counts  metronidazole This list may not describe all possible interactions. Give your health care provider a list of all the medicines, herbs, non-prescription drugs, or dietary supplements you use. Also tell them if you smoke, drink alcohol, or use illegal drugs. Some items may interact with your medicine. What should I watch for while using this medicine? Your condition will be monitored carefully while you are receiving this medicine. You may need blood work done  while you are taking this medicine. Drink water or other fluids as directed. Urinate often, even at night. Some products may contain alcohol. Ask your health care professional if this medicine contains alcohol. Be sure to tell all health care professionals you are taking this medicine. Certain medicines, like metronidazole and disulfiram, can cause an unpleasant reaction when taken with alcohol. The reaction includes flushing, headache, nausea, vomiting, sweating, and increased thirst. The reaction can last from 30 minutes to several hours. Do not become pregnant while taking this medicine or for 1 year after stopping it. Women should inform their health care professional if they wish to become pregnant or think they might be pregnant. Men should not father a child while taking this medicine and for 4 months after stopping it. There is potential for serious side effects to an unborn child. Talk to your health care professional for more information. Do not breast-feed an infant while taking this medicine or for 1 week after stopping it. This medicine has caused ovarian failure in some women. This medicine may make it more difficult to get pregnant. Talk to your health care professional if you are concerned about your fertility. This medicine has caused decreased sperm counts in some men. This may make it more difficult to father a child. Talk to your health care professional if you are concerned about your fertility. Call your health care professional for advice if you get a fever, chills, or sore throat, or other symptoms of a cold or flu. Do not treat yourself. This medicine decreases your body's ability to fight infections. Try to avoid being around people who are sick. Avoid taking medicines that contain aspirin, acetaminophen, ibuprofen, naproxen, or ketoprofen unless instructed by your health care professional. These medicines may hide a fever. Talk to your health care professional about your risk of  cancer. You may be more at risk for certain types of cancer if you take this medicine. If you are going to need surgery or other procedure, tell your health care professional that you are using this medicine. Be careful brushing or flossing your teeth or using a toothpick because you may get an infection or bleed more easily. If you have  any dental work done, tell your dentist you are receiving this medicine. What side effects may I notice from receiving this medicine? Side effects that you should report to your doctor or health care professional as soon as possible:  allergic reactions like skin rash, itching or hives, swelling of the face, lips, or tongue  breathing problems  nausea, vomiting  signs and symptoms of bleeding such as bloody or black, tarry stools; red or dark brown urine; spitting up blood or brown material that looks like coffee grounds; red spots on the skin; unusual bruising or bleeding from the eyes, gums, or nose  signs and symptoms of heart failure like fast, irregular heartbeat, sudden weight gain; swelling of the ankles, feet, hands  signs and symptoms of infection like fever; chills; cough; sore throat; pain or trouble passing urine  signs and symptoms of kidney injury like trouble passing urine or change in the amount of urine  signs and symptoms of liver injury like dark yellow or brown urine; general ill feeling or flu-like symptoms; light-colored stools; loss of appetite; nausea; right upper belly pain; unusually weak or tired; yellowing of the eyes or skin Side effects that usually do not require medical attention (report to your doctor or health care professional if they continue or are bothersome):  confusion  decreased hearing  diarrhea  facial flushing  hair loss  headache  loss of appetite  missed menstrual periods  signs and symptoms of low red blood cells or anemia such as unusually weak or tired; feeling faint or lightheaded; falls  skin  discoloration This list may not describe all possible side effects. Call your doctor for medical advice about side effects. You may report side effects to FDA at 1-800-FDA-1088. Where should I keep my medicine? This drug is given in a hospital or clinic and will not be stored at home. NOTE: This sheet is a summary. It may not cover all possible information. If you have questions about this medicine, talk to your doctor, pharmacist, or health care provider.  2020 Elsevier/Gold Standard (2018-09-28 09:53:29)

## 2019-01-20 NOTE — Progress Notes (Signed)
START OFF PATHWAY REGIMEN - Lymphoma and CLL   OFF02101:R-CHOP q21 Days:   A cycle is every 21 days:     Prednisone      Rituximab-xxxx      Cyclophosphamide      Doxorubicin      Vincristine   **Always confirm dose/schedule in your pharmacy ordering system**  Patient Characteristics: Mantle Cell Lymphoma, First Line, Aggressive Disease or Treatment Indicated, Stage II - IV, Transplant Candidate Disease Type: Not Applicable Disease Type: Mantle Cell Lymphoma Disease Type: Not Applicable Line of Therapy: First Line [place] Stage: IV Patient Characteristics: Transplant Candidate Intent of Therapy: Curative Intent, Discussed with Patient

## 2019-01-22 ENCOUNTER — Ambulatory Visit (HOSPITAL_COMMUNITY)
Admission: RE | Admit: 2019-01-22 | Discharge: 2019-01-22 | Disposition: A | Discharge status: Home / Self Care | Payer: BC Managed Care – PPO | Source: Ambulatory Visit | Attending: Physician Assistant | Admitting: Physician Assistant

## 2019-01-22 ENCOUNTER — Other Ambulatory Visit: Payer: Self-pay

## 2019-01-22 ENCOUNTER — Encounter: Payer: Self-pay | Admitting: Internal Medicine

## 2019-01-22 ENCOUNTER — Encounter (HOSPITAL_COMMUNITY): Payer: Self-pay | Admitting: Internal Medicine

## 2019-01-22 ENCOUNTER — Inpatient Hospital Stay: Payer: BC Managed Care – PPO

## 2019-01-22 DIAGNOSIS — I517 Cardiomegaly: Secondary | ICD-10-CM | POA: Diagnosis not present

## 2019-01-22 DIAGNOSIS — I10 Essential (primary) hypertension: Secondary | ICD-10-CM | POA: Diagnosis not present

## 2019-01-22 DIAGNOSIS — E785 Hyperlipidemia, unspecified: Secondary | ICD-10-CM | POA: Diagnosis not present

## 2019-01-22 DIAGNOSIS — C8316 Mantle cell lymphoma, intrapelvic lymph nodes: Secondary | ICD-10-CM | POA: Insufficient documentation

## 2019-01-22 NOTE — Progress Notes (Signed)
Echocardiogram 2D Echocardiogram has been performed.  Natalie Baker M 01/22/2019, 10:36 AM

## 2019-01-25 ENCOUNTER — Encounter: Payer: Self-pay | Admitting: Internal Medicine

## 2019-01-25 ENCOUNTER — Ambulatory Visit: Payer: BC Managed Care – PPO

## 2019-01-26 ENCOUNTER — Telehealth: Payer: Self-pay | Admitting: Medical Oncology

## 2019-01-26 NOTE — Progress Notes (Signed)
[facility] OFFICE PROGRESS NOTE  [name], MD [address] [place] [address]  DIAGNOSIS: Stage IV non-Hodgkin's lymphoma, mantle cell lymphoma.  She presented with left iliac and retroperitoneal lymphadenopathy.  She also presented with left axillary lymphadenopathy and bone marrow involvement.  She was diagnosed in December 2020.  PRIOR THERAPY: None  CURRENT THERAPY: Systemic chemotherapy with R-CHOP with Neulasta support.  She is expected to start her first dose on 01/27/2019.   INTERVAL HISTORY: JAELAH Baker 64 y.o. female returns to the clinic for a follow up visit. The patient is feeling well today without any concerning complaints. She was recently diagnosed with mantle cell lymphoma. She is scheduled to start her first cycle of treatment today as scheduled. She is taking her allopurinol and prednisone as prescribed. She denies any recent fevers, chills, night sweats, or weight loss. However, she reports feeling hot and sweaty occasionally even before taking the prednisone. She denies any nausea, vomiting, diarrhea, or constipation. Denies any headaches or visual changes. Denies any palpable lymphadenopathy. Denies any early satiety. She is here for evaluation before starting her first cycle of chemotherapy.    MEDICAL HISTORY: Past Medical History:  Diagnosis Date  . Allergy   . Cancer [facility])    being worked up for [place]  . Colon polyp 12/29/2006  . 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 Hyperlipidemia   . Hypothyroidism   . Low kidney function   . Obesity    BMI 34  . Osteoporosis   . Palpitations   . Vitamin D deficiency     ALLERGIES:  is allergic to ppd [tuberculin purified protein derivative].  MEDICATIONS:  Current Outpatient Medications  Medication Sig Dispense Refill  . allopurinol (ZYLOPRIM) 100 MG tablet Take 1 tablet (100 mg total) by mouth 2 (two) times daily. 60 tablet 3  . estradiol (VIVELLE-DOT) 0.05 MG/24HR patch  Place 1 patch onto the skin 2 (two) times a week.    . lidocaine-prilocaine (EMLA) cream Apply 1 application topically as needed. 30 g 0  . predniSONE (DELTASONE) 50 MG tablet Please take 2 tablets for 5 consecutive days starting on the first day of chemotherapy every cycle. 40 tablet 2  . prochlorperazine (COMPAZINE) 10 MG tablet Take 1 tablet (10 mg total) by mouth every 6 (six) hours as needed for nausea or vomiting. 30 tablet 0  . progesterone (PROMETRIUM) 100 MG capsule     . rosuvastatin (CRESTOR) 5 MG tablet Start by taking 1 tab by mouth at night once weekly; if tolerating, slowly increase frequency up to taking three nights a week (MWF). 30 tablet 1   No current facility-administered medications for this visit.    SURGICAL HISTORY:  Past Surgical History:  Procedure Laterality Date  . ABDOMINAL HYSTERECTOMY  1997  . CESAREAN SECTION  1983  . COLONOSCOPY W/ BIOPSIES  12/29/2006  . IR IMAGING GUIDED PORT INSERTION  01/15/2019  . LAPAROSCOPY     1991 for emdometriosis  . UPPER GASTROINTESTINAL ENDOSCOPY      REVIEW OF SYSTEMS:   Review of Systems  Constitutional: Negative for appetite change, chills, fatigue, fever and unexpected weight change.  HENT:   Negative for mouth sores, nosebleeds, sore throat and trouble swallowing.   Eyes: Negative for eye problems and icterus.  Respiratory: Negative for cough, hemoptysis, shortness of breath and wheezing.  Cardiovascular: Negative for chest pain and leg swelling.  Gastrointestinal: Negative for abdominal pain, constipation, diarrhea, nausea and vomiting.  Genitourinary: Negative for  bladder incontinence, difficulty urinating, dysuria, frequency and hematuria.   Musculoskeletal: Negative for back pain, gait problem, neck pain and neck stiffness.  Skin: Negative for itching and rash.  Neurological: Negative for dizziness, extremity weakness, gait problem, headaches, light-headedness and seizures.  Hematological: Negative for  adenopathy. Does not bruise/bleed easily.  Psychiatric/Behavioral: Negative for confusion, depression and sleep disturbance. The patient is not nervous/anxious.     PHYSICAL EXAMINATION:  There were no vitals taken for this visit.  ECOG PERFORMANCE STATUS: 0 - Asymptomatic  Physical Exam  Constitutional: Oriented to person, place, and time and well-developed, well-nourished, and in no distress.  HENT:  Head: Normocephalic and atraumatic.  Mouth/Throat: Oropharynx is clear and moist. No oropharyngeal exudate.  Eyes: Conjunctivae are normal. Right eye exhibits no discharge. Left eye exhibits no discharge. No scleral icterus.  Neck: Normal range of motion. Neck supple.  Cardiovascular: Normal rate, regular rhythm, normal heart sounds and intact distal pulses.   Pulmonary/Chest: Effort normal and breath sounds normal. No respiratory distress. No wheezes. No rales.  Abdominal: Soft. Bowel sounds are normal. Exhibits no distension and no mass. There is no tenderness.  Musculoskeletal: Normal range of motion. Exhibits no edema.  Lymphadenopathy:    No cervical adenopathy.  Neurological: Alert and oriented to person, place, and time. Exhibits normal muscle tone. Gait normal. Coordination normal.  Skin: Skin is warm and dry. No rash noted. Not diaphoretic. No erythema. No pallor.  Psychiatric: Mood, memory and judgment normal.  Vitals reviewed.  LABORATORY DATA: Lab Results  Component Value Date   WBC 7.7 01/15/2019   HGB 13.3 01/15/2019   HCT 42.6 01/15/2019   MCV 84.7 01/15/2019   PLT 202 01/15/2019      Chemistry      Component Value Date/Time   NA 142 01/12/2019 1134   K 4.4 01/12/2019 1134   CL 104 01/12/2019 1134   CO2 27 01/12/2019 1134   BUN 12 01/12/2019 1134   CREATININE 0.83 01/12/2019 1134   CREATININE 0.74 12/21/2018 1513      Component Value Date/Time   CALCIUM 9.8 01/12/2019 1134   ALKPHOS 84 01/12/2019 1134   AST 24 01/12/2019 1134   ALT 35 01/12/2019 1134    BILITOT 0.3 01/12/2019 1134       RADIOGRAPHIC STUDIES:  NM PET Image Restag (PS) Skull Base To Thigh  Result Date: 12/29/2018 CLINICAL DATA:  Initial treatment strategy for lymphadenopathy. EXAM: NUCLEAR MEDICINE PET SKULL BASE TO THIGH TECHNIQUE: 10.0 mCi F-18 FDG was injected intravenously. Full-ring PET imaging was performed from the skull base to thigh after the radiotracer. CT data was obtained and used for attenuation correction and anatomic localization. Fasting blood glucose: 102 mg/dl COMPARISON:  CT scan 12/22/2018 FINDINGS: Mediastinal blood pool activity: SUV max 2.9 Liver activity: SUV max 4.2 NECK: No hypermetabolic lymph nodes in the neck. Incidental CT findings: none CHEST: Mild metabolic activity within the axillary lymph nodes similar to background blood pool activity and less than liver activity. Example LEFT axillary node measures 9 mm short axis with SUV max 3.0 No hypermetabolic mediastinal nodes.  No enlarged mediastinal nodes Nodule in the RIGHT middle lobe measures 6 mm (68/3). No associated metabolic activity. There is atelectasis in the more peripheral RIGHT middle lobe also without metabolic Incidental CT findings: none ABDOMEN/PELVIS: enlarged retroperitoneal lymph node adjacent to the SMA measures 20 mm short axis and with moderate metabolic activity (SUV max equal 5.0) Enlarged LEFTinternal iliac lymph node measures 12 mm (image 133/3) with (SUV  max equal 5.5) There is enlarged LEFT external iliac lymph node measuring 19 mm on image 153/3 with SUV max equal 5.1. No enlarged hypermetabolic inguinal nodes. No central mesenteric nodes. There is a focus of metabolic activity along the inferior margin of the spleen SUV max equal 5.5 (image 107 fused data set.) No clear splenic lesion associated with this activity. Incidental CT findings: SKELETON: No focal hypermetabolic activity to suggest skeletal metastasis. Incidental CT findings: none IMPRESSION: 1. Hypermetabolic  retroperitoneal and LEFT iliac lymphadenopathy consistent with lymphoma. 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 Activity is slightly above liver activity. Granulomatous disease could also have this pattern but lymphoproliferative disease favored. 2. Mild activity associated with prominent LEFT axillary lymph nodes may also represent lymphoma. 3. Focal activity along the inferior margin of the spleen is indeterminate. No lesion seen on comparison contrast CT 4. No evidence of marrow involvement with lymphoproliferative process. 5. RIGHT middle lobe pulmonary nodule. Recommend attention on follow-up. 6. Fortissue sampling planning, the most assessable lymph node may be the LEFT external iliac node. LEFT axillary nodes are assessable but metabolic activity is significantly less. Electronically Signed   By: Suzy Bouchard M.D.   On: 12/29/2018 10:19   CT Biopsy  Result Date: 01/06/2019 CLINICAL DATA:  Hypermetabolic retroperitoneal and left iliac lymphadenopathy. No known primary. EXAM: CT GUIDED CORE BIOPSY OF LEFT EXTERNAL ILIAC ADENOPATHY ANESTHESIA/SEDATION: Intravenous Fentanyl 57mcg and Versed 100mg  were administered as conscious sedation during continuous monitoring of the patient's level of consciousness and physiological / cardiorespiratory status by the radiology RN, with a total moderate sedation time of 15 minutes. PROCEDURE: The procedure risks, benefits, and alternatives were explained to the patient. Questions regarding the procedure were encouraged and answered. The patient understands and consents to the procedure. Select axial scans through the pelvis were obtained. The left external iliac adenopathy was localized and an appropriate skin entry site was determined and marked. The operative field was prepped with chlorhexidinein a sterile fashion, and a sterile drape was applied covering the operative field. A sterile gown and sterile gloves were used for the  procedure. Local anesthesia was provided with 1% Lidocaine. Under CT fluoroscopic guidance, a 17 gauge trocar needle was advanced to the margin of the lesion. Once needle tip position was confirmed, coaxial 18-gauge core biopsy samples were obtained, submitted in saline to surgical pathology. The guide needle was removed. Postprocedure scans show no hemorrhage or other apparent complication. The patient tolerated the procedure well. COMPLICATIONS: None immediate FINDINGS: Left external iliac adenopathy was localized. Representative core biopsy samples obtained and submitted in saline to surgical pathology. IMPRESSION: 1. Technically successful CT-guided core biopsy, left external iliac adenopathy. Electronically Signed   By: Lucrezia Europe M.D.   On: 01/06/2019 16:42   ECHOCARDIOGRAM COMPLETE  Result Date: 01/22/2019   ECHOCARDIOGRAM REPORT   Patient Name:   ANALISSA HIRZEL Date of Exam: 01/22/2019 Medical Rec #:  [id number]        Height:       62.0 in Accession #:    [id number]       Weight:       199.7 lb Date of Birth:  Jan 05, 1956       BSA:          1.91 m Patient Age:    64 years         BP:           149/62 mmHg Patient Gender: F  HR:           97 bpm. Exam Location:  Outpatient Procedure: 2D Echo and Strain Analysis Indications:    Chemo V67.2 / Z09  History:        Patient has no prior history of Echocardiogram examinations.                 Risk Factors:Hypertension and Dyslipidemia. Lymphoma.  Sonographer:    Darlina Sicilian RDCS Referring Phys: [id number] Con[place]  1. Left ventricular ejection fraction, by visual estimation, is 55 to 60%. The left ventricle has normal function. There is mildly increased left ventricular hypertrophy.  2. Left ventricular diastolic parameters are consistent with Grade I diastolic dysfunction (impaired relaxation).  3. The average left ventricular global longitudinal strain is -13.7 %.  4. The left ventricle has no regional wall motion  abnormalities.  5. Global right ventricle has normal systolic function.The right ventricular size is normal. No increase in right ventricular wall thickness.  6. Left atrial size was normal.  7. Right atrial size was normal.  8. The mitral valve is grossly normal. No evidence of mitral valve regurgitation.  9. The tricuspid valve is grossly normal. 10. The aortic valve is tricuspid. Aortic valve regurgitation is not visualized. 11. The pulmonic valve was grossly normal. Pulmonic valve regurgitation is not visualized. 12. The inferior vena cava is normal in size with greater than 50% respiratory variability, suggesting right atrial pressure of 3 mmHg. FINDINGS  Left Ventricle: Left ventricular ejection fraction, by visual estimation, is 55 to 60%. The left ventricle has normal function. The average left ventricular global longitudinal strain is -13.7 %. The left ventricle has no regional wall motion abnormalities. There is mildly increased left ventricular hypertrophy. Left ventricular diastolic parameters are consistent with Grade I diastolic dysfunction (impaired relaxation). Indeterminate filling pressures. Right Ventricle: The right ventricular size is normal. No increase in right ventricular wall thickness. Global RV systolic function is has normal systolic function. Left Atrium: Left atrial size was normal in size. Right Atrium: Right atrial size was normal in size Pericardium: There is no evidence of pericardial effusion. Mitral Valve: The mitral valve is grossly normal. No evidence of mitral valve regurgitation. Tricuspid Valve: The tricuspid valve is grossly normal. Tricuspid valve regurgitation is not demonstrated. Aortic Valve: The aortic valve is tricuspid. Aortic valve regurgitation is not visualized. Pulmonic Valve: The pulmonic valve was grossly normal. Pulmonic valve regurgitation is not visualized. Pulmonic regurgitation is not visualized. Aorta: The aortic root and ascending aorta are structurally  normal, with no evidence of dilitation. Venous: The inferior vena cava is normal in size with greater than 50% respiratory variability, suggesting right atrial pressure of 3 mmHg. IAS/Shunts: No atrial level shunt detected by color flow Doppler.  LEFT VENTRICLE PLAX 2D LVIDd:         4.60 cm  Diastology LVIDs:         3.20 cm  LV e' lateral:   7.18 cm/s LV PW:         0.90 cm  LV E/e' lateral: 14.3 LV IVS:        1.30 cm  LV e' medial:    6.64 cm/s LVOT diam:     1.70 cm  LV E/e' medial:  15.5 LV SV:         56 ml LV SV Index:   27.71    2D Longitudinal Strain LVOT Area:     2.27 cm 2D Strain GLS Avg:     -  13.7 %  RIGHT VENTRICLE RV S prime:     13.70 cm/s TAPSE (M-mode): 2.3 cm LEFT ATRIUM             Index LA diam:        3.30 cm 1.73 cm/m LA Vol (A2C):   15.7 ml 8.22 ml/m LA Vol (A4C):   25.1 ml 13.14 ml/m LA Biplane Vol: 19.9 ml 10.42 ml/m  AORTIC VALVE LVOT Vmax:   86.80 cm/s LVOT Vmean:  61.200 cm/s LVOT VTI:    0.181 m  AORTA Ao Root diam: 2.40 cm MITRAL VALVE MV Area (PHT): 4.96 cm              SHUNTS MV PHT:        44.37 msec            Systemic VTI:  0.18 m MV Decel Time: 153 msec              Systemic Diam: 1.70 cm MV E velocity: 103.00 cm/s 103 cm/s MV A velocity: 112.00 cm/s 70.3 cm/s MV E/A ratio:  0.92        1.5  Lyman Bishop MD Electronically signed by Lyman Bishop MD Signature Date/Time: 01/22/2019/2:41:08 PM    Final    IR IMAGING GUIDED PORT INSERTION  Result Date: 01/15/2019 CLINICAL DATA:  Mantle cell lymphoma and need for porta cath to begin chemotherapy. EXAM: IMPLANTED PORT A CATH PLACEMENT WITH ULTRASOUND AND FLUOROSCOPIC GUIDANCE ANESTHESIA/SEDATION: 2.0 mg IV Versed; 100 mcg IV Fentanyl Total Moderate Sedation Time:  34 minutes The patient's level of consciousness and physiologic status were continuously monitored during the procedure by Radiology nursing. Additional Medications: 2 g IV Ancef. FLUOROSCOPY TIME:  30 seconds.  9.1 mGy. PROCEDURE: The procedure, risks, benefits,  and alternatives were explained to the patient. Questions regarding the procedure were encouraged and answered. The patient understands and consents to the procedure. A time-out was performed prior to initiating the procedure. Ultrasound was utilized to confirm patency of the right internal jugular vein. The right neck and chest were prepped with chlorhexidine in a sterile fashion, and a sterile drape was applied covering the operative field. Maximum barrier sterile technique with sterile gowns and gloves were used for the procedure. Local anesthesia was provided with 1% lidocaine. After creating a small venotomy incision, a 21 gauge needle was advanced into the right internal jugular vein under direct, real-time ultrasound guidance. Ultrasound image documentation was performed. After securing guidewire access, an 8 Fr dilator was placed. A J-wire was kinked to measure appropriate catheter length. A subcutaneous port pocket was then created along the upper chest wall utilizing sharp and blunt dissection. Portable cautery was utilized. The pocket was irrigated with sterile saline. A single lumen power injectable port was chosen for placement. The 8 Fr catheter was tunneled from the port pocket site to the venotomy incision. The port was placed in the pocket. External catheter was trimmed to appropriate length based on guidewire measurement. At the venotomy, an 8 Fr peel-away sheath was placed over a guidewire. The catheter was then placed through the sheath and the sheath removed. Final catheter positioning was confirmed and documented with a fluoroscopic spot image. The port was accessed with a needle and aspirated and flushed with heparinized saline. The access needle was removed. The venotomy and port pocket incisions were closed with subcutaneous 3-0 Monocryl and subcuticular 4-0 Vicryl. Dermabond was applied to both incisions. COMPLICATIONS: COMPLICATIONS None FINDINGS: After catheter placement, the tip lies at  the cavo-atrial junction. The catheter aspirates normally and is ready for immediate use. IMPRESSION: Placement of single lumen port a cath via right internal jugular vein. The catheter tip lies at the cavo-atrial junction. A power injectable port a cath was placed and is ready for immediate use. Electronically Signed   By: Aletta Edouard M.D.   On: 01/15/2019 14:51     ASSESSMENT/PLAN:  This is a very pleasant 64 year old Caucasian female recently diagnosed with stage IV non-Hodgkin's lymphoma, intermediate grade mantle cell lymphoma.  She presented with left iliac, axillary, and retroperitoneal lymphadenopathy.  She also presented with  bone marrow involvement.  She was diagnosed in December 2020.  She is currently undergoing systemic chemotherapy with R-CHOP. She is expected to start her first dose today.    Labs were reviewed. Recommend that she proceed with cycle #1 today as scheduled.   We will see her back for a follow up visit in 1 week for evaluation for a 1 week follow up visit.   The patient was advised to call immediately if she has any concerning symptoms in the interval. The patient voices understanding of current disease status and treatment options and is in agreement with the current care plan. All questions were answered. The patient knows to call the clinic with any problems, questions or concerns. We can certainly see the patient much sooner if necessary    No orders of the defined types were placed in this encounter.    Yareni Creps L Clarissa Laird, PA-C 01/26/19

## 2019-01-26 NOTE — Telephone Encounter (Signed)
Coworker tested positive. Can she come tomorrow for appt. She and coworker wore masks.  Natalie Baker said someone one from our office told her to keep her appt.

## 2019-01-27 ENCOUNTER — Inpatient Hospital Stay: Payer: BC Managed Care – PPO

## 2019-01-27 ENCOUNTER — Encounter: Payer: Self-pay | Admitting: Physician Assistant

## 2019-01-27 ENCOUNTER — Inpatient Hospital Stay: Payer: BC Managed Care – PPO | Admitting: Physician Assistant

## 2019-01-27 ENCOUNTER — Other Ambulatory Visit: Payer: Self-pay

## 2019-01-27 VITALS — BP 112/65 | HR 103 | Temp 97.3°F | Ht 62.0 in | Wt 198.6 lb

## 2019-01-27 VITALS — BP 119/73 | HR 92 | Temp 97.8°F | Resp 18

## 2019-01-27 DIAGNOSIS — Z5189 Encounter for other specified aftercare: Secondary | ICD-10-CM | POA: Diagnosis not present

## 2019-01-27 DIAGNOSIS — C8316 Mantle cell lymphoma, intrapelvic lymph nodes: Secondary | ICD-10-CM

## 2019-01-27 DIAGNOSIS — Z5111 Encounter for antineoplastic chemotherapy: Secondary | ICD-10-CM

## 2019-01-27 DIAGNOSIS — Z5112 Encounter for antineoplastic immunotherapy: Secondary | ICD-10-CM | POA: Diagnosis not present

## 2019-01-27 DIAGNOSIS — Z452 Encounter for adjustment and management of vascular access device: Secondary | ICD-10-CM | POA: Diagnosis not present

## 2019-01-27 DIAGNOSIS — C8310 Mantle cell lymphoma, unspecified site: Secondary | ICD-10-CM

## 2019-01-27 DIAGNOSIS — Z23 Encounter for immunization: Secondary | ICD-10-CM | POA: Diagnosis not present

## 2019-01-27 DIAGNOSIS — E039 Hypothyroidism, unspecified: Secondary | ICD-10-CM | POA: Diagnosis not present

## 2019-01-27 DIAGNOSIS — M2550 Pain in unspecified joint: Secondary | ICD-10-CM | POA: Diagnosis not present

## 2019-01-27 DIAGNOSIS — R112 Nausea with vomiting, unspecified: Secondary | ICD-10-CM | POA: Diagnosis not present

## 2019-01-27 DIAGNOSIS — Z95828 Presence of other vascular implants and grafts: Secondary | ICD-10-CM

## 2019-01-27 DIAGNOSIS — C8318 Mantle cell lymphoma, lymph nodes of multiple sites: Secondary | ICD-10-CM | POA: Diagnosis not present

## 2019-01-27 LAB — CBC WITH DIFFERENTIAL (CANCER CENTER ONLY)
Abs Immature Granulocytes: 0.04 10*3/uL (ref 0.00–0.07)
Basophils Absolute: 0.1 10*3/uL (ref 0.0–0.1)
Basophils Relative: 1 %
Eosinophils Absolute: 0.1 10*3/uL (ref 0.0–0.5)
Eosinophils Relative: 1 %
HCT: 41 % (ref 36.0–46.0)
Hemoglobin: 13 g/dL (ref 12.0–15.0)
Immature Granulocytes: 1 %
Lymphocytes Relative: 21 %
Lymphs Abs: 1.4 10*3/uL (ref 0.7–4.0)
MCH: 26.5 pg (ref 26.0–34.0)
MCHC: 31.7 g/dL (ref 30.0–36.0)
MCV: 83.5 fL (ref 80.0–100.0)
Monocytes Absolute: 0.5 10*3/uL (ref 0.1–1.0)
Monocytes Relative: 8 %
Neutro Abs: 4.7 10*3/uL (ref 1.7–7.7)
Neutrophils Relative %: 68 %
Platelet Count: 192 10*3/uL (ref 150–400)
RBC: 4.91 MIL/uL (ref 3.87–5.11)
RDW: 14.3 % (ref 11.5–15.5)
WBC Count: 6.8 10*3/uL (ref 4.0–10.5)
nRBC: 0 % (ref 0.0–0.2)

## 2019-01-27 LAB — URIC ACID: Uric Acid, Serum: 5.2 mg/dL (ref 2.5–7.1)

## 2019-01-27 LAB — CMP (CANCER CENTER ONLY)
ALT: 27 U/L (ref 0–44)
AST: 22 U/L (ref 15–41)
Albumin: 3.9 g/dL (ref 3.5–5.0)
Alkaline Phosphatase: 87 U/L (ref 38–126)
Anion gap: 9 (ref 5–15)
BUN: 15 mg/dL (ref 8–23)
CO2: 25 mmol/L (ref 22–32)
Calcium: 9.1 mg/dL (ref 8.9–10.3)
Chloride: 106 mmol/L (ref 98–111)
Creatinine: 1.13 mg/dL — ABNORMAL HIGH (ref 0.44–1.00)
GFR, Est AFR Am: 60 mL/min — ABNORMAL LOW (ref >60)
GFR, Estimated: 52 mL/min — ABNORMAL LOW (ref >60)
Glucose, Bld: 101 mg/dL — ABNORMAL HIGH (ref 70–99)
Potassium: 4.2 mmol/L (ref 3.5–5.1)
Sodium: 140 mmol/L (ref 135–145)
Total Bilirubin: 0.4 mg/dL (ref 0.3–1.2)
Total Protein: 6.9 g/dL (ref 6.5–8.1)

## 2019-01-27 LAB — LACTATE DEHYDROGENASE: LDH: 183 U/L (ref 98–192)

## 2019-01-27 MED ORDER — ACETAMINOPHEN 325 MG PO TABS
ORAL_TABLET | ORAL | Status: AC
  Filled 2019-01-27: qty 2

## 2019-01-27 MED ORDER — INFLUENZA VAC SPLIT QUAD 0.5 ML IM SUSY
0.5000 mL | PREFILLED_SYRINGE | Freq: Once | INTRAMUSCULAR | Status: AC
  Administered 2019-01-27: 15:00:00 0.5 mL via INTRAMUSCULAR

## 2019-01-27 MED ORDER — PALONOSETRON HCL INJECTION 0.25 MG/5ML
INTRAVENOUS | Status: AC
  Filled 2019-01-27: qty 5

## 2019-01-27 MED ORDER — DEXAMETHASONE SODIUM PHOSPHATE 10 MG/ML IJ SOLN
INTRAMUSCULAR | Status: AC
  Filled 2019-01-27: qty 1

## 2019-01-27 MED ORDER — SODIUM CHLORIDE 0.9% FLUSH
10.0000 mL | INTRAVENOUS | Status: DC | PRN
  Administered 2019-01-27: 10 mL via INTRAVENOUS
  Filled 2019-01-27: qty 10

## 2019-01-27 MED ORDER — SODIUM CHLORIDE 0.9 % IV SOLN
750.0000 mg/m2 | Freq: Once | INTRAVENOUS | Status: AC
  Administered 2019-01-27: 1500 mg via INTRAVENOUS
  Filled 2019-01-27: qty 75

## 2019-01-27 MED ORDER — HEPARIN SOD (PORK) LOCK FLUSH 100 UNIT/ML IV SOLN
500.0000 [IU] | Freq: Once | INTRAVENOUS | Status: AC | PRN
  Administered 2019-01-27: 16:00:00 500 [IU]
  Filled 2019-01-27: qty 5

## 2019-01-27 MED ORDER — SODIUM CHLORIDE 0.9% FLUSH
10.0000 mL | INTRAVENOUS | Status: DC | PRN
  Administered 2019-01-27: 16:00:00 10 mL
  Filled 2019-01-27: qty 10

## 2019-01-27 MED ORDER — DEXAMETHASONE SODIUM PHOSPHATE 10 MG/ML IJ SOLN
10.0000 mg | Freq: Once | INTRAMUSCULAR | Status: AC
  Administered 2019-01-27: 11:00:00 10 mg via INTRAVENOUS

## 2019-01-27 MED ORDER — SODIUM CHLORIDE 0.9 % IV SOLN
Freq: Once | INTRAVENOUS | Status: AC
  Filled 2019-01-27: qty 250

## 2019-01-27 MED ORDER — DIPHENHYDRAMINE HCL 25 MG PO CAPS
ORAL_CAPSULE | ORAL | Status: AC
  Filled 2019-01-27: qty 2

## 2019-01-27 MED ORDER — SODIUM CHLORIDE 0.9 % IV SOLN
150.0000 mg | Freq: Once | INTRAVENOUS | Status: AC
  Administered 2019-01-27: 10:00:00 150 mg via INTRAVENOUS
  Filled 2019-01-27: qty 5

## 2019-01-27 MED ORDER — ACETAMINOPHEN 325 MG PO TABS
650.0000 mg | ORAL_TABLET | Freq: Once | ORAL | Status: AC
  Administered 2019-01-27: 11:00:00 650 mg via ORAL

## 2019-01-27 MED ORDER — VINCRISTINE SULFATE CHEMO INJECTION 1 MG/ML
2.0000 mg | Freq: Once | INTRAVENOUS | Status: AC
  Administered 2019-01-27: 11:00:00 2 mg via INTRAVENOUS
  Filled 2019-01-27: qty 2

## 2019-01-27 MED ORDER — PALONOSETRON HCL INJECTION 0.25 MG/5ML
0.2500 mg | Freq: Once | INTRAVENOUS | Status: AC
  Administered 2019-01-27: 09:00:00 0.25 mg via INTRAVENOUS

## 2019-01-27 MED ORDER — SODIUM CHLORIDE 0.9 % IV SOLN: 10.0000 mg | Freq: Once | INTRAVENOUS | Status: DC

## 2019-01-27 MED ORDER — SODIUM CHLORIDE 0.9 % IV SOLN
375.0000 mg/m2 | Freq: Once | INTRAVENOUS | Status: AC
  Administered 2019-01-27: 12:00:00 700 mg via INTRAVENOUS
  Filled 2019-01-27: qty 50

## 2019-01-27 MED ORDER — DOXORUBICIN HCL CHEMO IV INJECTION 2 MG/ML
50.0000 mg/m2 | Freq: Once | INTRAVENOUS | Status: AC
  Administered 2019-01-27: 11:00:00 100 mg via INTRAVENOUS
  Filled 2019-01-27: qty 50

## 2019-01-27 MED ORDER — DIPHENHYDRAMINE HCL 25 MG PO CAPS
50.0000 mg | ORAL_CAPSULE | Freq: Once | ORAL | Status: AC
  Administered 2019-01-27: 11:00:00 50 mg via ORAL

## 2019-01-27 MED ORDER — INFLUENZA VAC SPLIT QUAD 0.5 ML IM SUSY
PREFILLED_SYRINGE | INTRAMUSCULAR | Status: AC
  Filled 2019-01-27: qty 0.5

## 2019-01-27 NOTE — Patient Instructions (Signed)
[facility] Discharge Instructions for Patients Receiving Chemotherapy  Today you received the following chemotherapy agents: doxorubicin, vincristine, cyclophosphamide, and rituximab.   To help prevent nausea and vomiting after your treatment, we encourage you to take your nausea medication as directed.   If you develop nausea and vomiting that is not controlled by your nausea medication, call the clinic.   BELOW ARE SYMPTOMS THAT SHOULD BE REPORTED IMMEDIATELY:  *FEVER GREATER THAN 100.5 F  *CHILLS WITH OR WITHOUT FEVER  NAUSEA AND VOMITING THAT IS NOT CONTROLLED WITH YOUR NAUSEA MEDICATION  *UNUSUAL SHORTNESS OF BREATH  *UNUSUAL BRUISING OR BLEEDING  TENDERNESS IN MOUTH AND THROAT WITH OR WITHOUT PRESENCE OF ULCERS  *URINARY PROBLEMS  *BOWEL PROBLEMS  UNUSUAL RASH Items with * indicate a potential emergency and should be followed up as soon as possible.  Feel free to call the clinic should you have any questions or concerns. The clinic phone number is [phone].  Please show the [facility] at check-in to the Emergency Department and triage nurse.  Doxorubicin injection What is this medicine? DOXORUBICIN (dox oh ROO bi sin) is a chemotherapy drug. It is used to treat many kinds of cancer like leukemia, lymphoma, neuroblastoma, sarcoma, and Wilms' tumor. It is also used to treat bladder cancer, breast cancer, lung cancer, ovarian cancer, stomach cancer, and thyroid cancer. This medicine may be used for other purposes; ask your health care provider or pharmacist if you have questions. COMMON BRAND NAME(S): Adriamycin, Adriamycin PFS, Adriamycin RDF, Rubex What should I tell my health care provider before I take this medicine? They need to know if you have any of these conditions:  heart disease  history of low blood counts caused by a medicine  liver disease  recent or ongoing radiation therapy  an unusual or allergic reaction to doxorubicin,  other chemotherapy agents, other medicines, foods, dyes, or preservatives  pregnant or trying to get pregnant  breast-feeding How should I use this medicine? This drug is given as an infusion into a vein. It is administered in a hospital or clinic by a specially trained health care professional. If you have pain, swelling, burning or any unusual feeling around the site of your injection, tell your health care professional right away. Talk to your pediatrician regarding the use of this medicine in children. Special care may be needed. Overdosage: If you think you have taken too much of this medicine contact a poison control center or emergency room at once. NOTE: This medicine is only for you. Do not share this medicine with others. What if I miss a dose? It is important not to miss your dose. Call your doctor or health care professional if you are unable to keep an appointment. What may interact with this medicine? This medicine may interact with the following medications:  6-mercaptopurine  paclitaxel  phenytoin  St. John's Wort  trastuzumab  verapamil This list may not describe all possible interactions. Give your health care provider a list of all the medicines, herbs, non-prescription drugs, or dietary supplements you use. Also tell them if you smoke, drink alcohol, or use illegal drugs. Some items may interact with your medicine. What should I watch for while using this medicine? This drug may make you feel generally unwell. This is not uncommon, as chemotherapy can affect healthy cells as well as cancer cells. Report any side effects. Continue your course of treatment even though you feel ill unless your doctor tells you to stop. There is  a maximum amount of this medicine you should receive throughout your life. The amount depends on the medical condition being treated and your overall health. Your doctor will watch how much of this medicine you receive in your lifetime. Tell your  doctor if you have taken this medicine before. You may need blood work done while you are taking this medicine. Your urine may turn red for a few days after your dose. This is not blood. If your urine is dark or brown, call your doctor. In some cases, you may be given additional medicines to help with side effects. Follow all directions for their use. Call your doctor or health care professional for advice if you get a fever, chills or sore throat, or other symptoms of a cold or flu. Do not treat yourself. This drug decreases your body's ability to fight infections. Try to avoid being around people who are sick. This medicine may increase your risk to bruise or bleed. Call your doctor or health care professional if you notice any unusual bleeding. Talk to your doctor about your risk of cancer. You may be more at risk for certain types of cancers if you take this medicine. Do not become pregnant while taking this medicine or for 6 months after stopping it. Women should inform their doctor if they wish to become pregnant or think they might be pregnant. Men should not father a child while taking this medicine and for 6 months after stopping it. There is a potential for serious side effects to an unborn child. Talk to your health care professional or pharmacist for more information. Do not breast-feed an infant while taking this medicine. This medicine has caused ovarian failure in some women and reduced sperm counts in some men This medicine may interfere with the ability to have a child. Talk with your doctor or health care professional if you are concerned about your fertility. This medicine may cause a decrease in Co-Enzyme Q-10. You should make sure that you get enough Co-Enzyme Q-10 while you are taking this medicine. Discuss the foods you eat and the vitamins you take with your health care professional. What side effects may I notice from receiving this medicine? Side effects that you should report to  your doctor or health care professional as soon as possible:  allergic reactions like skin rash, itching or hives, swelling of the face, lips, or tongue  breathing problems  chest pain  fast or irregular heartbeat  low blood counts - this medicine may decrease the number of white blood cells, red blood cells and platelets. You may be at increased risk for infections and bleeding.  pain, redness, or irritation at site where injected  signs of infection - fever or chills, cough, sore throat, pain or difficulty passing urine  signs of decreased platelets or bleeding - bruising, pinpoint red spots on the skin, black, tarry stools, blood in the urine  swelling of the ankles, feet, hands  tiredness  weakness Side effects that usually do not require medical attention (report to your doctor or health care professional if they continue or are bothersome):  diarrhea  hair loss  mouth sores  nail discoloration or damage  nausea  red colored urine  vomiting This list may not describe all possible side effects. Call your doctor for medical advice about side effects. You may report side effects to FDA at 1-800-FDA-1088. Where should I keep my medicine? This drug is given in a hospital or clinic and will not be  stored at home. NOTE: This sheet is a summary. It may not cover all possible information. If you have questions about this medicine, talk to your doctor, pharmacist, or health care provider.  2020 Elsevier/Gold Standard (2016-08-07 11:01:26)  Vincristine injection What is this medicine? VINCRISTINE (vin KRIS teen) is a chemotherapy drug. It slows the growth of cancer cells. This medicine is used to treat many types of cancer like Hodgkin's disease, leukemia, non-Hodgkin's lymphoma, neuroblastoma (brain cancer), rhabdomyosarcoma, and Wilms' tumor. This medicine may be used for other purposes; ask your health care provider or pharmacist if you have questions. COMMON BRAND  NAME(S): Oncovin, Vincasar PFS What should I tell my health care provider before I take this medicine? They need to know if you have any of these conditions:  blood disorders  gout  infection (especially chickenpox, cold sores, or herpes)  kidney disease  liver disease  lung disease  nervous system disease like Charcot-Marie-Tooth (CMT)  recent or ongoing radiation therapy  an unusual or allergic reaction to vincristine, other chemotherapy agents, other medicines, foods, dyes, or preservatives  pregnant or trying to get pregnant  breast-feeding How should I use this medicine? This drug is given as an infusion into a vein. It is administered in a hospital or clinic by a specially trained health care professional. If you have pain, swelling, burning, or any unusual feeling around the site of your injection, tell your health care professional right away. Talk to your pediatrician regarding the use of this medicine in children. While this drug may be prescribed for selected conditions, precautions do apply. Overdosage: If you think you have taken too much of this medicine contact a poison control center or emergency room at once. NOTE: This medicine is only for you. Do not share this medicine with others. What if I miss a dose? It is important not to miss your dose. Call your doctor or health care professional if you are unable to keep an appointment. What may interact with this medicine? Do not take this medicine with any of the following medications:  itraconazole  mibefradil  voriconazole This medicine may also interact with the following medications:  cyclosporine  erythromycin  fluconazole  ketoconazole  medicines for HIV like delavirdine, efavirenz, nevirapine  medicines for seizures like ethotoin, fosphenotoin, phenytoin  medicines to increase blood counts like filgrastim, pegfilgrastim, sargramostim  other chemotherapy drugs like cisplatin, L-asparaginase,  methotrexate, mitomycin, paclitaxel  pegaspargase  vaccines  zalcitabine, ddC Talk to your doctor or health care professional before taking any of these medicines:  acetaminophen  aspirin  ibuprofen  ketoprofen  naproxen This list may not describe all possible interactions. Give your health care provider a list of all the medicines, herbs, non-prescription drugs, or dietary supplements you use. Also tell them if you smoke, drink alcohol, or use illegal drugs. Some items may interact with your medicine. What should I watch for while using this medicine? This drug may make you feel generally unwell. This is not uncommon, as chemotherapy can affect healthy cells as well as cancer cells. Report any side effects. Continue your course of treatment even though you feel ill unless your doctor tells you to stop. You may need blood work done while you are taking this medicine. This medicine will cause constipation. Try to have a bowel movement at least every 2 to 3 days. If you do not have a bowel movement for 3 days, call your doctor or health care professional. In some cases, you may be given additional  medicines to help with side effects. Follow all directions for their use. Do not become pregnant while taking this medicine. Women should inform their doctor if they wish to become pregnant or think they might be pregnant. There is a potential for serious side effects to an unborn child. Talk to your health care professional or pharmacist for more information. Do not breast-feed an infant while taking this medicine. This medicine may make it more difficult to get pregnant or to father a child. Talk to your healthcare professional if you are concerned about your fertility. What side effects may I notice from receiving this medicine? Side effects that you should report to your doctor or health care professional as soon as possible:  allergic reactions like skin rash, itching or hives, swelling of  the face, lips, or tongue  breathing problems  confusion or changes in emotions or moods  constipation  cough  mouth sores  muscle weakness  nausea and vomiting  pain, swelling, redness or irritation at the injection site  pain, tingling, numbness in the hands or feet  problems with balance, talking, walking  seizures  stomach pain  trouble passing urine or change in the amount of urine Side effects that usually do not require medical attention (report to your doctor or health care professional if they continue or are bothersome):  diarrhea  hair loss  jaw pain  loss of appetite This list may not describe all possible side effects. Call your doctor for medical advice about side effects. You may report side effects to FDA at 1-800-FDA-1088. Where should I keep my medicine? This drug is given in a hospital or clinic and will not be stored at home. NOTE: This sheet is a summary. It may not cover all possible information. If you have questions about this medicine, talk to your doctor, pharmacist, or health care provider.  2020 Elsevier/Gold Standard (2017-09-26 08:55:02)  Cyclophosphamide Injection What is this medicine? CYCLOPHOSPHAMIDE (sye kloe FOSS fa mide) is a chemotherapy drug. It slows the growth of cancer cells. This medicine is used to treat many types of cancer like lymphoma, myeloma, leukemia, breast cancer, and ovarian cancer, to name a few. This medicine may be used for other purposes; ask your health care provider or pharmacist if you have questions. COMMON BRAND NAME(S): Cytoxan, Neosar What should I tell my health care provider before I take this medicine? They need to know if you have any of these conditions:  heart disease  history of irregular heartbeat  infection  kidney disease  liver disease  low blood counts, like white cells, platelets, or red blood cells  on hemodialysis  recent or ongoing radiation therapy  scarring or  thickening of the lungs  trouble passing urine  an unusual or allergic reaction to cyclophosphamide, other medicines, foods, dyes, or preservatives  pregnant or trying to get pregnant  breast-feeding How should I use this medicine? This drug is usually given as an injection into a vein or muscle or by infusion into a vein. It is administered in a hospital or clinic by a specially trained health care professional. Talk to your pediatrician regarding the use of this medicine in children. Special care may be needed. Overdosage: If you think you have taken too much of this medicine contact a poison control center or emergency room at once. NOTE: This medicine is only for you. Do not share this medicine with others. What if I miss a dose? It is important not to miss your dose. Call  your doctor or health care professional if you are unable to keep an appointment. What may interact with this medicine?  amphotericin B  azathioprine  certain antivirals for HIV or hepatitis  certain medicines for blood pressure, heart disease, irregular heart beat  certain medicines that treat or prevent blood clots like warfarin  certain other medicines for cancer  cyclosporine  etanercept  indomethacin  medicines that relax muscles for surgery  medicines to increase blood counts  metronidazole This list may not describe all possible interactions. Give your health care provider a list of all the medicines, herbs, non-prescription drugs, or dietary supplements you use. Also tell them if you smoke, drink alcohol, or use illegal drugs. Some items may interact with your medicine. What should I watch for while using this medicine? Your condition will be monitored carefully while you are receiving this medicine. You may need blood work done while you are taking this medicine. Drink water or other fluids as directed. Urinate often, even at night. Some products may contain alcohol. Ask your health care  professional if this medicine contains alcohol. Be sure to tell all health care professionals you are taking this medicine. Certain medicines, like metronidazole and disulfiram, can cause an unpleasant reaction when taken with alcohol. The reaction includes flushing, headache, nausea, vomiting, sweating, and increased thirst. The reaction can last from 30 minutes to several hours. Do not become pregnant while taking this medicine or for 1 year after stopping it. Women should inform their health care professional if they wish to become pregnant or think they might be pregnant. Men should not father a child while taking this medicine and for 4 months after stopping it. There is potential for serious side effects to an unborn child. Talk to your health care professional for more information. Do not breast-feed an infant while taking this medicine or for 1 week after stopping it. This medicine has caused ovarian failure in some women. This medicine may make it more difficult to get pregnant. Talk to your health care professional if you are concerned about your fertility. This medicine has caused decreased sperm counts in some men. This may make it more difficult to father a child. Talk to your health care professional if you are concerned about your fertility. Call your health care professional for advice if you get a fever, chills, or sore throat, or other symptoms of a cold or flu. Do not treat yourself. This medicine decreases your body's ability to fight infections. Try to avoid being around people who are sick. Avoid taking medicines that contain aspirin, acetaminophen, ibuprofen, naproxen, or ketoprofen unless instructed by your health care professional. These medicines may hide a fever. Talk to your health care professional about your risk of cancer. You may be more at risk for certain types of cancer if you take this medicine. If you are going to need surgery or other procedure, tell your health care  professional that you are using this medicine. Be careful brushing or flossing your teeth or using a toothpick because you may get an infection or bleed more easily. If you have any dental work done, tell your dentist you are receiving this medicine. What side effects may I notice from receiving this medicine? Side effects that you should report to your doctor or health care professional as soon as possible:  allergic reactions like skin rash, itching or hives, swelling of the face, lips, or tongue  breathing problems  nausea, vomiting  signs and symptoms of bleeding such  as bloody or black, tarry stools; red or dark brown urine; spitting up blood or brown material that looks like coffee grounds; red spots on the skin; unusual bruising or bleeding from the eyes, gums, or nose  signs and symptoms of heart failure like fast, irregular heartbeat, sudden weight gain; swelling of the ankles, feet, hands  signs and symptoms of infection like fever; chills; cough; sore throat; pain or trouble passing urine  signs and symptoms of kidney injury like trouble passing urine or change in the amount of urine  signs and symptoms of liver injury like dark yellow or brown urine; general ill feeling or flu-like symptoms; light-colored stools; loss of appetite; nausea; right upper belly pain; unusually weak or tired; yellowing of the eyes or skin Side effects that usually do not require medical attention (report to your doctor or health care professional if they continue or are bothersome):  confusion  decreased hearing  diarrhea  facial flushing  hair loss  headache  loss of appetite  missed menstrual periods  signs and symptoms of low red blood cells or anemia such as unusually weak or tired; feeling faint or lightheaded; falls  skin discoloration This list may not describe all possible side effects. Call your doctor for medical advice about side effects. You may report side effects to FDA at  1-800-FDA-1088. Where should I keep my medicine? This drug is given in a hospital or clinic and will not be stored at home. NOTE: This sheet is a summary. It may not cover all possible information. If you have questions about this medicine, talk to your doctor, pharmacist, or health care provider.  2020 Elsevier/Gold Standard (2018-09-28 09:53:29)  Rituximab injection What is this medicine? RITUXIMAB (ri TUX i mab) is a monoclonal antibody. It is used to treat certain types of cancer like non-Hodgkin lymphoma and chronic lymphocytic leukemia. It is also used to treat rheumatoid arthritis, granulomatosis with polyangiitis (or Wegener's granulomatosis), microscopic polyangiitis, and pemphigus vulgaris. This medicine may be used for other purposes; ask your health care provider or pharmacist if you have questions. COMMON BRAND NAME(S): Rituxan, RUXIENCE What should I tell my health care provider before I take this medicine? They need to know if you have any of these conditions:  heart disease  infection (especially a virus infection such as hepatitis B, chickenpox, cold sores, or herpes)  immune system problems  irregular heartbeat  kidney disease  low blood counts, like low white cell, platelet, or red cell counts  lung or breathing disease, like asthma  recently received or scheduled to receive a vaccine  an unusual or allergic reaction to rituximab, other medicines, foods, dyes, or preservatives  pregnant or trying to get pregnant  breast-feeding How should I use this medicine? This medicine is for infusion into a vein. It is administered in a hospital or clinic by a specially trained health care professional. A special MedGuide will be given to you by the pharmacist with each prescription and refill. Be sure to read this information carefully each time. Talk to your pediatrician regarding the use of this medicine in children. This medicine is not approved for use in  children. Overdosage: If you think you have taken too much of this medicine contact a poison control center or emergency room at once. NOTE: This medicine is only for you. Do not share this medicine with others. What if I miss a dose? It is important not to miss a dose. Call your doctor or health care professional if  you are unable to keep an appointment. What may interact with this medicine?  cisplatin  live virus vaccines This list may not describe all possible interactions. Give your health care provider a list of all the medicines, herbs, non-prescription drugs, or dietary supplements you use. Also tell them if you smoke, drink alcohol, or use illegal drugs. Some items may interact with your medicine. What should I watch for while using this medicine? Your condition will be monitored carefully while you are receiving this medicine. You may need blood work done while you are taking this medicine. This medicine can cause serious allergic reactions. To reduce your risk you may need to take medicine before treatment with this medicine. Take your medicine as directed. In some patients, this medicine may cause a serious brain infection that may cause death. If you have any problems seeing, thinking, speaking, walking, or standing, tell your healthcare professional right away. If you cannot reach your healthcare professional, urgently seek other source of medical care. Call your doctor or health care professional for advice if you get a fever, chills or sore throat, or other symptoms of a cold or flu. Do not treat yourself. This drug decreases your body's ability to fight infections. Try to avoid being around people who are sick. Do not become pregnant while taking this medicine or for at least 12 months after stopping it. Women should inform their doctor if they wish to become pregnant or think they might be pregnant. There is a potential for serious side effects to an unborn child. Talk to your health  care professional or pharmacist for more information. Do not breast-feed an infant while taking this medicine or for at least 6 months after stopping it. What side effects may I notice from receiving this medicine? Side effects that you should report to your doctor or health care professional as soon as possible:  allergic reactions like skin rash, itching or hives; swelling of the face, lips, or tongue  breathing problems  chest pain  changes in vision  diarrhea  headache with fever, neck stiffness, sensitivity to light, nausea, or confusion  fast, irregular heartbeat  loss of memory  low blood counts - this medicine may decrease the number of white blood cells, red blood cells and platelets. You may be at increased risk for infections and bleeding.  mouth sores  problems with balance, talking, or walking  redness, blistering, peeling or loosening of the skin, including inside the mouth  signs of infection - fever or chills, cough, sore throat, pain or difficulty passing urine  signs and symptoms of kidney injury like trouble passing urine or change in the amount of urine  signs and symptoms of liver injury like dark yellow or brown urine; general ill feeling or flu-like symptoms; light-colored stools; loss of appetite; nausea; right upper belly pain; unusually weak or tired; yellowing of the eyes or skin  signs and symptoms of low blood pressure like dizziness; feeling faint or lightheaded, falls; unusually weak or tired  stomach pain  swelling of the ankles, feet, hands  unusual bleeding or bruising  vomiting Side effects that usually do not require medical attention (report to your doctor or health care professional if they continue or are bothersome):  headache  joint pain  muscle cramps or muscle pain  nausea  tiredness This list may not describe all possible side effects. Call your doctor for medical advice about side effects. You may report side effects  to FDA at 1-800-FDA-1088. Where should  I keep my medicine? This drug is given in a hospital or clinic and will not be stored at home. NOTE: This sheet is a summary. It may not cover all possible information. If you have questions about this medicine, talk to your doctor, pharmacist, or health care provider.  2020 Elsevier/Gold Standard (2018-02-04 22:01:36)

## 2019-01-27 NOTE — Patient Instructions (Signed)

## 2019-01-28 ENCOUNTER — Telehealth: Payer: Self-pay | Admitting: *Deleted

## 2019-01-29 ENCOUNTER — Other Ambulatory Visit: Payer: Self-pay

## 2019-01-29 ENCOUNTER — Inpatient Hospital Stay: Payer: BC Managed Care – PPO

## 2019-01-29 VITALS — BP 135/71 | HR 83 | Temp 98.0°F | Resp 16

## 2019-01-29 DIAGNOSIS — C8318 Mantle cell lymphoma, lymph nodes of multiple sites: Secondary | ICD-10-CM | POA: Diagnosis not present

## 2019-01-29 DIAGNOSIS — Z452 Encounter for adjustment and management of vascular access device: Secondary | ICD-10-CM | POA: Diagnosis not present

## 2019-01-29 DIAGNOSIS — C8316 Mantle cell lymphoma, intrapelvic lymph nodes: Secondary | ICD-10-CM

## 2019-01-29 DIAGNOSIS — Z5112 Encounter for antineoplastic immunotherapy: Secondary | ICD-10-CM | POA: Diagnosis not present

## 2019-01-29 DIAGNOSIS — Z5111 Encounter for antineoplastic chemotherapy: Secondary | ICD-10-CM | POA: Diagnosis not present

## 2019-01-29 DIAGNOSIS — Z5189 Encounter for other specified aftercare: Secondary | ICD-10-CM | POA: Diagnosis not present

## 2019-01-29 DIAGNOSIS — E039 Hypothyroidism, unspecified: Secondary | ICD-10-CM | POA: Diagnosis not present

## 2019-01-29 DIAGNOSIS — R112 Nausea with vomiting, unspecified: Secondary | ICD-10-CM | POA: Diagnosis not present

## 2019-01-29 DIAGNOSIS — Z23 Encounter for immunization: Secondary | ICD-10-CM | POA: Diagnosis not present

## 2019-01-29 DIAGNOSIS — M2550 Pain in unspecified joint: Secondary | ICD-10-CM | POA: Diagnosis not present

## 2019-01-29 MED ORDER — PEGFILGRASTIM-JMDB 6 MG/0.6ML ~~LOC~~ SOSY
PREFILLED_SYRINGE | SUBCUTANEOUS | Status: AC
  Filled 2019-01-29: qty 0.6

## 2019-01-29 MED ORDER — PEGFILGRASTIM-JMDB 6 MG/0.6ML ~~LOC~~ SOSY
6.0000 mg | PREFILLED_SYRINGE | Freq: Once | SUBCUTANEOUS | Status: AC
  Administered 2019-01-29: 6 mg via SUBCUTANEOUS

## 2019-01-29 NOTE — Patient Instructions (Signed)

## 2019-02-02 NOTE — Progress Notes (Signed)
[place] OFFICE PROGRESS NOTE  [name], MD [address] [place] [address]  DIAGNOSIS: Stage IV non-Hodgkin's lymphoma, mantle cell lymphoma.  She presented with left iliac and retroperitoneal lymphadenopathy.  She also presented with left axillary lymphadenopathy and bone marrow involvement.  She was diagnosed in December 2020.  PRIOR THERAPY: None  CURRENT THERAPY: Systemic chemotherapy with R-CHOP with Neulasta support.  First dose on 01/27/2019. Status post 1 cycle.   INTERVAL HISTORY: Natalie Baker 64 y.o. female returns to the clinic for a follow up visit accompanied by her daughter, husband. The patient is feeling fair today but she has been experiencing persistent nausea since receiving her first cycle of R-CHOP last week. She had been taking her anti-emetic every 6 hours since her infusion for nausea. She stopped taking her anti-emetic yesterday because she thought her nausea had subsided, but then she developed an episode of emesis this morning. Her nausea has been affecting her appetite; however, she has not lost any weight. She denies any thrush.   She denies any fever, chills, night sweats, or lymphadenopathy. She continues to feel hot which has been occurring for several weeks even prior to her prednisone use. She had mild migratory myalgias/arthralgias secondary to her neulasta injection. Denies any chest pain, shortness of breath, cough, or hemoptysis. Denies any diarrhea, early satiety, abdominal pain, or constipation. Denies any headache or visual changes. Denies any rashes or skin changes except she had an episode the day after chemotherapy with facial/upper chest flushing. She denied any associated itching, rashes, or respiratory symptoms. The patient is here today for evaluation and repeat blood work for a 1 week follow up visit after completing her first cycle of chemotherapy.    MEDICAL HISTORY: Past Medical History:   Diagnosis Date  . Allergy   . Cancer [facility])    being worked up for [place]  . Colon polyp 12/29/2006  . 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 Hyperlipidemia   . Hypothyroidism   . Low kidney function   . Obesity    BMI 34  . Osteoporosis   . Palpitations   . Vitamin D deficiency     ALLERGIES:  is allergic to ppd [tuberculin purified protein derivative].  MEDICATIONS:  Current Outpatient Medications  Medication Sig Dispense Refill  . allopurinol (ZYLOPRIM) 100 MG tablet Take 1 tablet (100 mg total) by mouth 2 (two) times daily. 60 tablet 3  . ARMOUR THYROID 90 MG tablet Take 90 mg by mouth every morning.    Marland Kitchen estradiol (VIVELLE-DOT) 0.05 MG/24HR patch Place 1 patch onto the skin 2 (two) times a week.    . lidocaine-prilocaine (EMLA) cream Apply 1 application topically as needed. 30 g 0  . predniSONE (DELTASONE) 50 MG tablet Please take 2 tablets for 5 consecutive days starting on the first day of chemotherapy every cycle. 40 tablet 2  . prochlorperazine (COMPAZINE) 10 MG tablet Take 1 tablet (10 mg total) by mouth every 6 (six) hours as needed for nausea or vomiting. 30 tablet 0  . progesterone (PROMETRIUM) 100 MG capsule     . rosuvastatin (CRESTOR) 5 MG tablet Start by taking 1 tab by mouth at night once weekly; if tolerating, slowly increase frequency up to taking three nights a week (MWF). (Patient not taking: Reported on 01/27/2019) 30 tablet 1   No current facility-administered medications for this visit.    SURGICAL HISTORY:  Past Surgical History:  Procedure Laterality Date  . ABDOMINAL HYSTERECTOMY  1997  .  CESAREAN SECTION  1983  . COLONOSCOPY W/ BIOPSIES  12/29/2006  . IR IMAGING GUIDED PORT INSERTION  01/15/2019  . LAPAROSCOPY     1991 for emdometriosis  . UPPER GASTROINTESTINAL ENDOSCOPY      REVIEW OF SYSTEMS:   Review of Systems  Constitutional: Positive for fatigue and decreased appetite. Negative for chills, fever and unexpected weight change.  HENT: Negative  for mouth sores, nosebleeds, sore throat and trouble swallowing.   Eyes: Negative for eye problems and icterus.  Respiratory: Negative for cough, hemoptysis, shortness of breath and wheezing.   Cardiovascular: Negative for chest pain and leg swelling.  Gastrointestinal: Positive for nausea and one episode of vomiting. Negative for abdominal pain, constipation, and diarrhea.  Genitourinary: Negative for bladder incontinence, difficulty urinating, dysuria, frequency and hematuria.   Musculoskeletal: Negative for back pain, gait problem, neck pain and neck stiffness.  Skin: Negative for itching and rash.  Neurological: Negative for dizziness, extremity weakness, gait problem, headaches, light-headedness and seizures.  Hematological: Negative for adenopathy. Does not bruise/bleed easily.  Psychiatric/Behavioral: Negative for confusion, depression and sleep disturbance. The patient is not nervous/anxious.     PHYSICAL EXAMINATION:  There were no vitals taken for this visit.  ECOG PERFORMANCE STATUS: 1 - Symptomatic but completely ambulatory  Physical Exam  Constitutional: Oriented to person, place, and time and well-developed, well-nourished, and in no distress.  HENT:  Head: Normocephalic and atraumatic.  Mouth/Throat: Oropharynx is clear and moist. No oropharyngeal exudate.  Eyes: Conjunctivae are normal. Right eye exhibits no discharge. Left eye exhibits no discharge. No scleral icterus.  Neck: Normal range of motion. Neck supple.  Cardiovascular: Normal rate, regular rhythm, normal heart sounds and intact distal pulses.   Pulmonary/Chest: Effort normal and breath sounds normal. No respiratory distress. No wheezes. No rales.  Abdominal: Soft. Bowel sounds are normal. Exhibits no distension and no mass. There is no tenderness.  Musculoskeletal: Normal range of motion. Exhibits no edema.  Lymphadenopathy:    No cervical adenopathy.  Neurological: Alert and oriented to person, place, and  time. Exhibits normal muscle tone. Gait normal. Coordination normal.  Skin: Skin is warm and dry. No rash noted. Not diaphoretic. No erythema. No pallor.  Psychiatric: Mood, memory and judgment normal.  Vitals reviewed.  LABORATORY DATA: Lab Results  Component Value Date   WBC 6.8 01/27/2019   HGB 13.0 01/27/2019   HCT 41.0 01/27/2019   MCV 83.5 01/27/2019   PLT 192 01/27/2019      Chemistry      Component Value Date/Time   NA 140 01/27/2019 0732   K 4.2 01/27/2019 0732   CL 106 01/27/2019 0732   CO2 25 01/27/2019 0732   BUN 15 01/27/2019 0732   CREATININE 1.13 (H) 01/27/2019 0732   CREATININE 0.74 12/21/2018 1513      Component Value Date/Time   CALCIUM 9.1 01/27/2019 0732   ALKPHOS 87 01/27/2019 0732   AST 22 01/27/2019 0732   ALT 27 01/27/2019 0732   BILITOT 0.4 01/27/2019 0732       RADIOGRAPHIC STUDIES:  CT Biopsy  Result Date: 01/06/2019 CLINICAL DATA:  Hypermetabolic retroperitoneal and left iliac lymphadenopathy. No known primary. EXAM: CT GUIDED CORE BIOPSY OF LEFT EXTERNAL ILIAC ADENOPATHY ANESTHESIA/SEDATION: Intravenous Fentanyl 28mcg and Versed 100mg  were administered as conscious sedation during continuous monitoring of the patient's level of consciousness and physiological / cardiorespiratory status by the radiology RN, with a total moderate sedation time of 15 minutes. PROCEDURE: The procedure risks, benefits, and alternatives were  explained to the patient. Questions regarding the procedure were encouraged and answered. The patient understands and consents to the procedure. Select axial scans through the pelvis were obtained. The left external iliac adenopathy was localized and an appropriate skin entry site was determined and marked. The operative field was prepped with chlorhexidinein a sterile fashion, and a sterile drape was applied covering the operative field. A sterile gown and sterile gloves were used for the procedure. Local anesthesia was provided  with 1% Lidocaine. Under CT fluoroscopic guidance, a 17 gauge trocar needle was advanced to the margin of the lesion. Once needle tip position was confirmed, coaxial 18-gauge core biopsy samples were obtained, submitted in saline to surgical pathology. The guide needle was removed. Postprocedure scans show no hemorrhage or other apparent complication. The patient tolerated the procedure well. COMPLICATIONS: None immediate FINDINGS: Left external iliac adenopathy was localized. Representative core biopsy samples obtained and submitted in saline to surgical pathology. IMPRESSION: 1. Technically successful CT-guided core biopsy, left external iliac adenopathy. Electronically Signed   By: Lucrezia Europe M.D.   On: 01/06/2019 16:42   ECHOCARDIOGRAM COMPLETE  Result Date: 01/22/2019   ECHOCARDIOGRAM REPORT   Patient Name:   ZENIYA ODEGAARD Date of Exam: 01/22/2019 Medical Rec #:  [id number]        Height:       62.0 in Accession #:    [id number]       Weight:       199.7 lb Date of Birth:  20-Oct-1955       BSA:          1.91 m Patient Age:    22 years         BP:           149/62 mmHg Patient Gender: F                HR:           97 bpm. Exam Location:  Outpatient Procedure: 2D Echo and Strain Analysis Indications:    Chemo V67.2 / Z09  History:        Patient has no prior history of Echocardiogram examinations.                 Risk Factors:Hypertension and Dyslipidemia. Lymphoma.  Sonographer:    Darlina Sicilian RDCS Referring Phys: [id number] [place]  1. Left ventricular ejection fraction, by visual estimation, is 55 to 60%. The left ventricle has normal function. There is mildly increased left ventricular hypertrophy.  2. Left ventricular diastolic parameters are consistent with Grade I diastolic dysfunction (impaired relaxation).  3. The average left ventricular global longitudinal strain is -13.7 %.  4. The left ventricle has no regional wall motion abnormalities.  5. Global right ventricle  has normal systolic function.The right ventricular size is normal. No increase in right ventricular wall thickness.  6. Left atrial size was normal.  7. Right atrial size was normal.  8. The mitral valve is grossly normal. No evidence of mitral valve regurgitation.  9. The tricuspid valve is grossly normal. 10. The aortic valve is tricuspid. Aortic valve regurgitation is not visualized. 11. The pulmonic valve was grossly normal. Pulmonic valve regurgitation is not visualized. 12. The inferior vena cava is normal in size with greater than 50% respiratory variability, suggesting right atrial pressure of 3 mmHg. FINDINGS  Left Ventricle: Left ventricular ejection fraction, by visual estimation, is 55 to 60%. The left ventricle has normal function. The average left  ventricular global longitudinal strain is -13.7 %. The left ventricle has no regional wall motion abnormalities. There is mildly increased left ventricular hypertrophy. Left ventricular diastolic parameters are consistent with Grade I diastolic dysfunction (impaired relaxation). Indeterminate filling pressures. Right Ventricle: The right ventricular size is normal. No increase in right ventricular wall thickness. Global RV systolic function is has normal systolic function. Left Atrium: Left atrial size was normal in size. Right Atrium: Right atrial size was normal in size Pericardium: There is no evidence of pericardial effusion. Mitral Valve: The mitral valve is grossly normal. No evidence of mitral valve regurgitation. Tricuspid Valve: The tricuspid valve is grossly normal. Tricuspid valve regurgitation is not demonstrated. Aortic Valve: The aortic valve is tricuspid. Aortic valve regurgitation is not visualized. Pulmonic Valve: The pulmonic valve was grossly normal. Pulmonic valve regurgitation is not visualized. Pulmonic regurgitation is not visualized. Aorta: The aortic root and ascending aorta are structurally normal, with no evidence of dilitation.  Venous: The inferior vena cava is normal in size with greater than 50% respiratory variability, suggesting right atrial pressure of 3 mmHg. IAS/Shunts: No atrial level shunt detected by color flow Doppler.  LEFT VENTRICLE PLAX 2D LVIDd:         4.60 cm  Diastology LVIDs:         3.20 cm  LV e' lateral:   7.18 cm/s LV PW:         0.90 cm  LV E/e' lateral: 14.3 LV IVS:        1.30 cm  LV e' medial:    6.64 cm/s LVOT diam:     1.70 cm  LV E/e' medial:  15.5 LV SV:         56 ml LV SV Index:   27.71    2D Longitudinal Strain LVOT Area:     2.27 cm 2D Strain GLS Avg:     -13.7 %  RIGHT VENTRICLE RV S prime:     13.70 cm/s TAPSE (M-mode): 2.3 cm LEFT ATRIUM             Index LA diam:        3.30 cm 1.73 cm/m LA Vol (A2C):   15.7 ml 8.22 ml/m LA Vol (A4C):   25.1 ml 13.14 ml/m LA Biplane Vol: 19.9 ml 10.42 ml/m  AORTIC VALVE LVOT Vmax:   86.80 cm/s LVOT Vmean:  61.200 cm/s LVOT VTI:    0.181 m  AORTA Ao Root diam: 2.40 cm MITRAL VALVE MV Area (PHT): 4.96 cm              SHUNTS MV PHT:        44.37 msec            Systemic VTI:  0.18 m MV Decel Time: 153 msec              Systemic Diam: 1.70 cm MV E velocity: 103.00 cm/s 103 cm/s MV A velocity: 112.00 cm/s 70.3 cm/s MV E/A ratio:  0.92        1.5  Lyman Bishop MD Electronically signed by Lyman Bishop MD Signature Date/Time: 01/22/2019/2:41:08 PM    Final    IR IMAGING GUIDED PORT INSERTION  Result Date: 01/15/2019 CLINICAL DATA:  Mantle cell lymphoma and need for porta cath to begin chemotherapy. EXAM: IMPLANTED PORT A CATH PLACEMENT WITH ULTRASOUND AND FLUOROSCOPIC GUIDANCE ANESTHESIA/SEDATION: 2.0 mg IV Versed; 100 mcg IV Fentanyl Total Moderate Sedation Time:  34 minutes The patient's level of consciousness and physiologic status  were continuously monitored during the procedure by Radiology nursing. Additional Medications: 2 g IV Ancef. FLUOROSCOPY TIME:  30 seconds.  9.1 mGy. PROCEDURE: The procedure, risks, benefits, and alternatives were explained to the  patient. Questions regarding the procedure were encouraged and answered. The patient understands and consents to the procedure. A time-out was performed prior to initiating the procedure. Ultrasound was utilized to confirm patency of the right internal jugular vein. The right neck and chest were prepped with chlorhexidine in a sterile fashion, and a sterile drape was applied covering the operative field. Maximum barrier sterile technique with sterile gowns and gloves were used for the procedure. Local anesthesia was provided with 1% lidocaine. After creating a small venotomy incision, a 21 gauge needle was advanced into the right internal jugular vein under direct, real-time ultrasound guidance. Ultrasound image documentation was performed. After securing guidewire access, an 8 Fr dilator was placed. A J-wire was kinked to measure appropriate catheter length. A subcutaneous port pocket was then created along the upper chest wall utilizing sharp and blunt dissection. Portable cautery was utilized. The pocket was irrigated with sterile saline. A single lumen power injectable port was chosen for placement. The 8 Fr catheter was tunneled from the port pocket site to the venotomy incision. The port was placed in the pocket. External catheter was trimmed to appropriate length based on guidewire measurement. At the venotomy, an 8 Fr peel-away sheath was placed over a guidewire. The catheter was then placed through the sheath and the sheath removed. Final catheter positioning was confirmed and documented with a fluoroscopic spot image. The port was accessed with a needle and aspirated and flushed with heparinized saline. The access needle was removed. The venotomy and port pocket incisions were closed with subcutaneous 3-0 Monocryl and subcuticular 4-0 Vicryl. Dermabond was applied to both incisions. COMPLICATIONS: COMPLICATIONS None FINDINGS: After catheter placement, the tip lies at the cavo-atrial junction. The catheter  aspirates normally and is ready for immediate use. IMPRESSION: Placement of single lumen port a cath via right internal jugular vein. The catheter tip lies at the cavo-atrial junction. A power injectable port a cath was placed and is ready for immediate use. Electronically Signed   By: Aletta Edouard M.D.   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 She presented with left iliac, axillary, and retroperitoneal lymphadenopathy.  She also presented with bone marrow involvement.  She was diagnosed in December 2020.  She is currently undergoing treatment with R-CHOP with Neulasta support. She is status post her 1st cycle and tolerated it well except for nausea and one episode of vomiting.   The patient was seen with Dr. Julien Nordmann. Labs were reviewed. She will continue on her current treatment.   We will see her back for a follow up visit in 3 weeks for evaluation before starting cycle #2.   For her nausea, I have sent a prescription for Zofran to her pharmacy as well 8 mg p.o. every 8 hours as needed for nausea. I have also sent a refill of her compazine.   She will continue to use Claritin for when she receives her neulasta injections for her arthralgias.   Discussed her facial flushing with Dr. Julien Nordmann who stated it is likely secondary to her prednisone use.  The patient was advised to call immediately if she has any concerning symptoms in the interval. The patient voices  understanding of current disease status and treatment options and is in agreement with the current care plan. All questions were answered. The patient knows to call the clinic with any problems, questions or concerns. We can certainly see the patient much sooner if necessary.    No orders of the defined types were placed in this encounter.    Brendalyn Vallely L Ashlie Mcmenamy,  PA-C 02/02/19  ADDENDUM: Hematology/Oncology Attending: I had a face-to-face encounter with the patient today.  I recommended her care plan.  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  She is currently undergoing systemic chemotherapy with R-CHOP status post 1 cycle.  She tolerated the first week of her treatment well except for few episodes of nausea improved with Compazine. She also has some aching pain and fatigue after the Neulasta injection. She denied having any current fever or chills.   I recommended for the patient to continue her treatment as planned and she will come back for follow-up visit in 2 weeks for evaluation before starting cycle #2.  We started the patient on Zofran 8 mg p.o. every 8 hours as needed for nausea if refractory to Compazine. She was advised to call immediately if she has any other concerning symptoms in the interval.  Disclaimer: This note was dictated with voice recognition software. Similar sounding words can inadvertently be transcribed and may be missed upon review. Eilleen Kempf, MD 02/03/19

## 2019-02-03 ENCOUNTER — Inpatient Hospital Stay: Payer: BC Managed Care – PPO

## 2019-02-03 ENCOUNTER — Encounter: Payer: Self-pay | Admitting: Physician Assistant

## 2019-02-03 ENCOUNTER — Ambulatory Visit: Payer: BC Managed Care – PPO | Admitting: Physician Assistant

## 2019-02-03 ENCOUNTER — Other Ambulatory Visit: Payer: Self-pay

## 2019-02-03 ENCOUNTER — Inpatient Hospital Stay (HOSPITAL_BASED_OUTPATIENT_CLINIC_OR_DEPARTMENT_OTHER): Payer: BC Managed Care – PPO | Admitting: Physician Assistant

## 2019-02-03 ENCOUNTER — Other Ambulatory Visit: Payer: BC Managed Care – PPO

## 2019-02-03 DIAGNOSIS — C8318 Mantle cell lymphoma, lymph nodes of multiple sites: Secondary | ICD-10-CM | POA: Diagnosis not present

## 2019-02-03 DIAGNOSIS — R112 Nausea with vomiting, unspecified: Secondary | ICD-10-CM | POA: Diagnosis not present

## 2019-02-03 DIAGNOSIS — Z95828 Presence of other vascular implants and grafts: Secondary | ICD-10-CM | POA: Insufficient documentation

## 2019-02-03 DIAGNOSIS — C8310 Mantle cell lymphoma, unspecified site: Secondary | ICD-10-CM

## 2019-02-03 DIAGNOSIS — Z452 Encounter for adjustment and management of vascular access device: Secondary | ICD-10-CM | POA: Diagnosis not present

## 2019-02-03 DIAGNOSIS — Z5111 Encounter for antineoplastic chemotherapy: Secondary | ICD-10-CM | POA: Diagnosis not present

## 2019-02-03 DIAGNOSIS — Z5112 Encounter for antineoplastic immunotherapy: Secondary | ICD-10-CM | POA: Diagnosis not present

## 2019-02-03 DIAGNOSIS — Z5189 Encounter for other specified aftercare: Secondary | ICD-10-CM | POA: Diagnosis not present

## 2019-02-03 DIAGNOSIS — M2550 Pain in unspecified joint: Secondary | ICD-10-CM | POA: Diagnosis not present

## 2019-02-03 DIAGNOSIS — E039 Hypothyroidism, unspecified: Secondary | ICD-10-CM | POA: Diagnosis not present

## 2019-02-03 DIAGNOSIS — Z23 Encounter for immunization: Secondary | ICD-10-CM | POA: Diagnosis not present

## 2019-02-03 LAB — CBC WITH DIFFERENTIAL (CANCER CENTER ONLY)
Abs Immature Granulocytes: 0.38 10*3/uL — ABNORMAL HIGH (ref 0.00–0.07)
Basophils Absolute: 0.1 10*3/uL (ref 0.0–0.1)
Basophils Relative: 2 %
Eosinophils Absolute: 0.2 10*3/uL (ref 0.0–0.5)
Eosinophils Relative: 4 %
HCT: 43.6 % (ref 36.0–46.0)
Hemoglobin: 13.8 g/dL (ref 12.0–15.0)
Immature Granulocytes: 7 %
Lymphocytes Relative: 22 %
Lymphs Abs: 1.1 10*3/uL (ref 0.7–4.0)
MCH: 26.1 pg (ref 26.0–34.0)
MCHC: 31.7 g/dL (ref 30.0–36.0)
MCV: 82.6 fL (ref 80.0–100.0)
Monocytes Absolute: 0.5 10*3/uL (ref 0.1–1.0)
Monocytes Relative: 10 %
Neutro Abs: 2.9 10*3/uL (ref 1.7–7.7)
Neutrophils Relative %: 55 %
Platelet Count: 197 10*3/uL (ref 150–400)
RBC: 5.28 MIL/uL — ABNORMAL HIGH (ref 3.87–5.11)
RDW: 14.6 % (ref 11.5–15.5)
WBC Count: 5.3 10*3/uL (ref 4.0–10.5)
nRBC: 0 % (ref 0.0–0.2)

## 2019-02-03 LAB — CMP (CANCER CENTER ONLY)
ALT: 19 U/L (ref 0–44)
AST: 11 U/L — ABNORMAL LOW (ref 15–41)
Albumin: 4.1 g/dL (ref 3.5–5.0)
Alkaline Phosphatase: 109 U/L (ref 38–126)
Anion gap: 10 (ref 5–15)
BUN: 12 mg/dL (ref 8–23)
CO2: 28 mmol/L (ref 22–32)
Calcium: 9.5 mg/dL (ref 8.9–10.3)
Chloride: 100 mmol/L (ref 98–111)
Creatinine: 0.78 mg/dL (ref 0.44–1.00)
GFR, Est AFR Am: >60 mL/min (ref >60)
GFR, Estimated: >60 mL/min (ref >60)
Glucose, Bld: 94 mg/dL (ref 70–99)
Potassium: 4.4 mmol/L (ref 3.5–5.1)
Sodium: 138 mmol/L (ref 135–145)
Total Bilirubin: 0.3 mg/dL (ref 0.3–1.2)
Total Protein: 7.2 g/dL (ref 6.5–8.1)

## 2019-02-03 LAB — LACTATE DEHYDROGENASE: LDH: 189 U/L (ref 98–192)

## 2019-02-03 MED ORDER — PROCHLORPERAZINE MALEATE 10 MG PO TABS: 10.0000 mg | ORAL_TABLET | Freq: Four times a day (QID) | ORAL | 3 refills | Status: DC | PRN

## 2019-02-03 MED ORDER — HEPARIN SOD (PORK) LOCK FLUSH 100 UNIT/ML IV SOLN
500.0000 [IU] | Freq: Once | INTRAVENOUS | Status: AC | PRN
  Administered 2019-02-03: 500 [IU]
  Filled 2019-02-03: qty 5

## 2019-02-03 MED ORDER — ONDANSETRON HCL 8 MG PO TABS: 8.0000 mg | ORAL_TABLET | Freq: Three times a day (TID) | ORAL | 1 refills | Status: DC | PRN

## 2019-02-03 MED ORDER — SODIUM CHLORIDE 0.9% FLUSH
10.0000 mL | INTRAVENOUS | Status: DC | PRN
  Administered 2019-02-03: 10 mL
  Filled 2019-02-03: qty 10

## 2019-02-04 ENCOUNTER — Telehealth: Payer: Self-pay | Admitting: Medical Oncology

## 2019-02-04 NOTE — Telephone Encounter (Signed)
Lower back pain started today ,likely from Pegfilgrastim. I instructed her per Julien Nordmann to take Tylenol and call back tomorrow with update.

## 2019-02-10 ENCOUNTER — Inpatient Hospital Stay: Payer: BC Managed Care – PPO | Attending: Physician Assistant

## 2019-02-10 ENCOUNTER — Inpatient Hospital Stay: Payer: BC Managed Care – PPO

## 2019-02-10 ENCOUNTER — Other Ambulatory Visit: Payer: Self-pay

## 2019-02-10 DIAGNOSIS — Z5111 Encounter for antineoplastic chemotherapy: Secondary | ICD-10-CM | POA: Diagnosis not present

## 2019-02-10 DIAGNOSIS — R110 Nausea: Secondary | ICD-10-CM | POA: Insufficient documentation

## 2019-02-10 DIAGNOSIS — C8310 Mantle cell lymphoma, unspecified site: Secondary | ICD-10-CM

## 2019-02-10 DIAGNOSIS — E669 Obesity, unspecified: Secondary | ICD-10-CM | POA: Diagnosis not present

## 2019-02-10 DIAGNOSIS — E039 Hypothyroidism, unspecified: Secondary | ICD-10-CM | POA: Insufficient documentation

## 2019-02-10 DIAGNOSIS — M810 Age-related osteoporosis without current pathological fracture: Secondary | ICD-10-CM | POA: Diagnosis not present

## 2019-02-10 DIAGNOSIS — Z452 Encounter for adjustment and management of vascular access device: Secondary | ICD-10-CM | POA: Diagnosis not present

## 2019-02-10 DIAGNOSIS — Z5189 Encounter for other specified aftercare: Secondary | ICD-10-CM | POA: Diagnosis not present

## 2019-02-10 DIAGNOSIS — Z95828 Presence of other vascular implants and grafts: Secondary | ICD-10-CM

## 2019-02-10 DIAGNOSIS — C8318 Mantle cell lymphoma, lymph nodes of multiple sites: Secondary | ICD-10-CM | POA: Insufficient documentation

## 2019-02-10 DIAGNOSIS — Z5112 Encounter for antineoplastic immunotherapy: Secondary | ICD-10-CM | POA: Insufficient documentation

## 2019-02-10 LAB — CMP (CANCER CENTER ONLY)
ALT: 19 U/L (ref 0–44)
AST: 16 U/L (ref 15–41)
Albumin: 3.8 g/dL (ref 3.5–5.0)
Alkaline Phosphatase: 129 U/L — ABNORMAL HIGH (ref 38–126)
Anion gap: 9 (ref 5–15)
BUN: 11 mg/dL (ref 8–23)
CO2: 27 mmol/L (ref 22–32)
Calcium: 9.2 mg/dL (ref 8.9–10.3)
Chloride: 104 mmol/L (ref 98–111)
Creatinine: 0.84 mg/dL (ref 0.44–1.00)
GFR, Est AFR Am: >60 mL/min (ref >60)
GFR, Estimated: >60 mL/min (ref >60)
Glucose, Bld: 97 mg/dL (ref 70–99)
Potassium: 4 mmol/L (ref 3.5–5.1)
Sodium: 140 mmol/L (ref 135–145)
Total Bilirubin: <0.2 mg/dL — ABNORMAL LOW (ref 0.3–1.2)
Total Protein: 6.9 g/dL (ref 6.5–8.1)

## 2019-02-10 LAB — CBC WITH DIFFERENTIAL (CANCER CENTER ONLY)
Abs Immature Granulocytes: 1.4 10*3/uL — ABNORMAL HIGH (ref 0.00–0.07)
Band Neutrophils: 1 %
Basophils Absolute: 0 10*3/uL (ref 0.0–0.1)
Basophils Relative: 0 %
Eosinophils Absolute: 0 10*3/uL (ref 0.0–0.5)
Eosinophils Relative: 0 %
HCT: 40.8 % (ref 36.0–46.0)
Hemoglobin: 12.9 g/dL (ref 12.0–15.0)
Lymphocytes Relative: 12 %
Lymphs Abs: 1.7 10*3/uL (ref 0.7–4.0)
MCH: 26.5 pg (ref 26.0–34.0)
MCHC: 31.6 g/dL (ref 30.0–36.0)
MCV: 83.8 fL (ref 80.0–100.0)
Metamyelocytes Relative: 4 %
Monocytes Absolute: 1 10*3/uL (ref 0.1–1.0)
Monocytes Relative: 7 %
Myelocytes: 6 %
Neutro Abs: 10.2 10*3/uL — ABNORMAL HIGH (ref 1.7–7.7)
Neutrophils Relative %: 70 %
Platelet Count: 181 10*3/uL (ref 150–400)
RBC: 4.87 MIL/uL (ref 3.87–5.11)
RDW: 15.2 % (ref 11.5–15.5)
WBC Count: 14.3 10*3/uL — ABNORMAL HIGH (ref 4.0–10.5)
nRBC: 0.3 % — ABNORMAL HIGH (ref 0.0–0.2)

## 2019-02-10 LAB — LACTATE DEHYDROGENASE: LDH: 328 U/L — ABNORMAL HIGH (ref 98–192)

## 2019-02-10 MED ORDER — SODIUM CHLORIDE 0.9% FLUSH
10.0000 mL | INTRAVENOUS | Status: DC | PRN
  Administered 2019-02-10: 10:00:00 10 mL
  Filled 2019-02-10: qty 10

## 2019-02-10 MED ORDER — HEPARIN SOD (PORK) LOCK FLUSH 100 UNIT/ML IV SOLN
500.0000 [IU] | Freq: Once | INTRAVENOUS | Status: AC | PRN
  Administered 2019-02-10: 500 [IU]
  Filled 2019-02-10: qty 5

## 2019-02-10 NOTE — Patient Instructions (Signed)

## 2019-02-15 NOTE — Progress Notes (Deleted)
Assessment and Plan:  Natalie Baker was seen today for acute visit.  Diagnoses and all orders for this visit:  Atherosclerosis of aorta Per CT 12/2018 Control blood pressure, cholesterol, glucose, increase exercise.   Hypothyroidism, unspecified type continue medications the same pending lab results reminded to take on an empty stomach 30-43mins before food.  check TSH level -     TSH  Prediabetes Discussed disease and risks Discussed diet/exercise, weight management  A1C -     Hemoglobin A1c  Elevated BP without diagnosis of hypertension Not on meds, typically well controlled Monitor blood pressure at home; call if consistently over 130/80 Continue DASH diet.   Reminder to go to the ER if any CP, SOB, nausea, dizziness, severe HA, changes vision/speech, left arm numbness and tingling and jaw pain.  Morbid obesity ([place]) Long discussion about weight loss, diet, and exercise Recommended diet heavy in fruits and veggies and low in animal meats, cheeses, and dairy products, appropriate calorie intake Discussed appropriate weight for height and initial goal (195lb) *** Follow up at next visit  Hyperlipidemia, unspecified hyperlipidemia type Not currently on medications, discussed LDL goal <100 unless severely elevated today, *** defer starting meds and work on lifestyle, and recheck at [place] low cholesterol diet and exercise.  Check lipid panel.  -     Lipid panel -     TSH  Vitamin D deficiency *** vitamin D check today; restart on daily supplement - 2000 IU daily   Medication management -     CBC with Differential/Platelet -     COMPLETE METABOLIC PANEL WITH GFR   Further disposition pending results of labs. Discussed med's effects and SE's.   Over 30 minutes of exam, counseling, chart review, and critical decision making was performed.   Future Appointments  Date Time Provider [place]  02/16/2019  3:45 PM Liane Comber, NP [facility] None  02/18/2019   7:45 AM [facility] LAB 5 [facility] None  02/18/2019  8:00 AM [facility] [place] FLUSH [facility] None  02/18/2019  8:30 AM Curt Bears, MD [facility] None  02/18/2019  9:30 AM [facility] INFUSION [facility] None  02/20/2019 10:30 AM [facility] [place] FLUSH [facility] None  02/24/2019  1:00 PM [facility] LAB 5 [facility] None  02/24/2019  1:15 PM [facility] [place] FLUSH [facility] None  03/03/2019  1:15 PM [facility] LAB 2 [facility] None  03/03/2019  1:30 PM [facility] [place] FLUSH [facility] None  03/10/2019  8:45 AM [facility] LAB 6 [facility] None  03/10/2019  9:00 AM [facility] [place] None  03/10/2019  9:30 AM Curt Bears, MD [facility] None  03/10/2019 10:15 AM [facility] INFUSION [facility] None  03/12/2019  9:30 AM [facility] [place] FLUSH [facility] None  08/18/2019  3:00 PM Takila Kronberg, Caryl Pina, NP [facility] None    ------------------------------------------------------------------------------------------------------------------   HPI There were no vitals taken for this visit.  64 y.o.female presents 6 month follow up chronic health conditions; she has prediabetes, hypothyroid, hyperlipidemia, morbid obesity, CKD III (reportedly followed by nephrology)  She was diagnosed in December 2020 with Stage IV non-Hodgkin's lymphoma, mantle cell lymphoma per Dr. Earlie Server. She presented with left iliac and retroperitoneal lymphadenopathy. She also presented with left axillary lymphadenopathy and bone marrow involvement. She is undergoing systemic chemotherapy with R-CHOPwithNeulasta support. First dose on 01/27/2019. Status post 1 cycle. Follows  BMI is There is no height or weight on file to calculate BMI., she has not been working on diet and exercise. Wt Readings from Last 3 Encounters:  02/03/19 197 lb 14.4 oz (89.8 kg)  01/27/19 198 lb 9.6 oz (90.1 kg)  01/20/19 199 lb 11.2 oz (90.6 kg)   She has aortic atherosclerosis per Ct 12/2018.  She has not been checking blood pressure but has  cuff if needed, today their BP is    She does not workout. She denies chest pain, shortness of breath, dizziness.   She is not on cholesterol medication (on RYRS and CoQ10 for several years) and denies myalgias. Her cholesterol is not at goal. The cholesterol last visit was:   Lab Results  Component Value Date   CHOL 223 (H) 08/13/2018   HDL 53 08/13/2018   LDLCALC 139 (H) 08/13/2018   TRIG 173 (H) 08/13/2018   CHOLHDL 4.2 08/13/2018    She has not been working on diet and exercise for prediabetes, and denies increased appetite, nausea, paresthesia of the feet, polydipsia, polyuria, visual disturbances and vomiting. Last A1C in the office was:  Lab Results  Component Value Date   HGBA1C 5.7 (H) 02/02/2018   She is on thyroid medication. Her medication was not changed last visit.   Lab Results  Component Value Date   TSH 2.20 08/13/2018   Patient is not currently on Vitamin D supplement.   Lab Results  Component Value Date   VD25OH 29 (L) 01/29/2017      CKD ? Followed by nephrology Lab Results  Component Value Date   GFRNONAA >60 02/10/2019       Past Medical History:  Diagnosis Date  . Allergy   . Cancer [facility])    being worked up for [place]  . Colon polyp 12/29/2006  . 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 Hyperlipidemia   . Hypothyroidism   . Low kidney function   . Obesity    BMI 34  . Osteoporosis   . Palpitations   . Vitamin D deficiency      Allergies  Allergen Reactions  . Ppd [Tuberculin Purified Protein Derivative]     Positive reaction.  Negative Chest Xray 06/16/12    Current Outpatient Medications on File Prior to Visit  Medication Sig  . allopurinol (ZYLOPRIM) 100 MG tablet Take 1 tablet (100 mg total) by mouth 2 (two) times daily.  Natalie Baker THYROID 90 MG tablet Take 90 mg by mouth every morning.  Natalie Baker Kitchen estradiol (VIVELLE-DOT) 0.05 MG/24HR patch Place 1 patch onto the skin 2 (two) times a week.  . lidocaine-prilocaine (EMLA) cream Apply 1 application  topically as needed.  . ondansetron (ZOFRAN) 8 MG tablet Take 1 tablet (8 mg total) by mouth every 8 (eight) hours as needed for nausea or vomiting.  . predniSONE (DELTASONE) 50 MG tablet Please take 2 tablets for 5 consecutive days starting on the first day of chemotherapy every cycle.  . prochlorperazine (COMPAZINE) 10 MG tablet Take 1 tablet (10 mg total) by mouth every 6 (six) hours as needed for nausea or vomiting.  . progesterone (PROMETRIUM) 100 MG capsule   . rosuvastatin (CRESTOR) 5 MG tablet Start by taking 1 tab by mouth at night once weekly; if tolerating, slowly increase frequency up to taking three nights a week (MWF). (Patient not taking: Reported on 01/27/2019)   No current facility-administered medications on file prior to visit.    ROS: Review of Systems  Constitutional: Negative for chills, diaphoresis, fever, malaise/fatigue and weight loss.  HENT: Negative for congestion, hearing loss, sinus pain, sore throat and tinnitus.   Eyes: Negative for blurred vision and double vision.  Respiratory: Negative for cough, hemoptysis, sputum production, shortness of  breath and wheezing.   Cardiovascular: Negative for chest pain, palpitations, orthopnea, claudication and leg swelling.  Gastrointestinal: Negative for abdominal pain, blood in stool, constipation, diarrhea, heartburn, melena, nausea and vomiting.  Genitourinary: Negative.   Musculoskeletal: Negative for joint pain and myalgias.  Skin: Negative for rash.  Neurological: Negative for dizziness, tingling, sensory change, weakness and headaches.  Endo/Heme/Allergies: Negative for environmental allergies and polydipsia.  Psychiatric/Behavioral: Negative.   All other systems reviewed and are negative.    Physical Exam:  There were no vitals taken for this visit.  General Appearance: Well nourished, in no apparent distress, appears flushed Eyes: PERRLA, EOMs, conjunctiva no swelling or erythema Sinuses: No  Frontal/maxillary tenderness ENT/Mouth: Ext aud canals clear, TMs without erythema, bulging. No erythema, swelling, or exudate on post pharynx.  Tonsils not swollen or erythematous. Hearing normal. Occasional dry cough in office.  Neck: Supple, thyroid normal.  Respiratory: Respiratory effort normal, BS equal bilaterally without rales, rhonchi, wheezing or stridor.  Cardio: RRR with no MRGs. Brisk peripheral pulses without edema.  Abdomen: Soft, + BS.  Non tender, no guarding, rebound, hernias, masses. Lymphatics: Non tender without lymphadenopathy. *** Musculoskeletal: Full ROM, 5/5 strength, normal gait.  Skin: Warm, dry without rashes, lesions, ecchymosis.  Neuro: Cranial nerves intact. Normal muscle tone, no cerebellar symptoms. Sensation intact.  Psych: Awake and oriented X 3, normal affect, Insight and Judgment appropriate.     Izora Ribas, NP 5:43 PM [facility]

## 2019-02-16 ENCOUNTER — Ambulatory Visit: Payer: BC Managed Care – PPO | Admitting: Adult Health

## 2019-02-16 DIAGNOSIS — N951 Menopausal and female climacteric states: Secondary | ICD-10-CM | POA: Diagnosis not present

## 2019-02-16 DIAGNOSIS — E039 Hypothyroidism, unspecified: Secondary | ICD-10-CM | POA: Diagnosis not present

## 2019-02-16 DIAGNOSIS — C8310 Mantle cell lymphoma, unspecified site: Secondary | ICD-10-CM | POA: Diagnosis not present

## 2019-02-16 DIAGNOSIS — R5383 Other fatigue: Secondary | ICD-10-CM | POA: Diagnosis not present

## 2019-02-17 NOTE — Progress Notes (Signed)
Rapid Infusion Rituximab Pharmacist Evaluation  Natalie Baker is a 64 y.o. female being treated with rituximab for lymphoma. This patient may be considered for RIR.   A pharmacist has verified the patient tolerated rituximab infusions per the [facility] standard infusion protocol without grade 3-4 infusion reactions. The treatment plan will be updated to reflect RIR if the patient qualifies per the checklist below:   Age > 28 years old Yes   Clinically significant cardiovascular disease No   Circulating lymphocyte count < 5000/uL prior to cycle two Yes  Lab Results  Component Value Date   LYMPHSABS 1.7 02/10/2019    Prior documented grade 3-4 infusion reaction to rituximab No  Prior documented grade 1-2 infusion reaction to rituximab (If YES, Pharmacist will confirm with Physician if patient is still a candidate for RIR) No   Previous rituximab infusion within the past 6 months Yes   Treatment Plan updated orders to reflect RIR Yes    Natalie Baker does meet the criteria for Rapid Infusion Rituximab. This patient is going to be switched to rapid infusion rituximab.   Norwood Levo [facility] 02/17/19 3:10 PM

## 2019-02-18 ENCOUNTER — Inpatient Hospital Stay: Payer: BC Managed Care – PPO | Admitting: Internal Medicine

## 2019-02-18 ENCOUNTER — Other Ambulatory Visit: Payer: BC Managed Care – PPO

## 2019-02-18 ENCOUNTER — Other Ambulatory Visit: Payer: Self-pay

## 2019-02-18 ENCOUNTER — Ambulatory Visit: Payer: BC Managed Care – PPO

## 2019-02-18 ENCOUNTER — Inpatient Hospital Stay: Payer: BC Managed Care – PPO

## 2019-02-18 ENCOUNTER — Ambulatory Visit: Payer: BC Managed Care – PPO | Admitting: Internal Medicine

## 2019-02-18 ENCOUNTER — Encounter: Payer: Self-pay | Admitting: Internal Medicine

## 2019-02-18 VITALS — BP 132/64 | HR 94 | Temp 98.5°F | Resp 20 | Ht 62.0 in | Wt 201.3 lb

## 2019-02-18 VITALS — BP 128/66 | HR 90 | Temp 97.8°F | Resp 18

## 2019-02-18 DIAGNOSIS — C8316 Mantle cell lymphoma, intrapelvic lymph nodes: Secondary | ICD-10-CM | POA: Diagnosis not present

## 2019-02-18 DIAGNOSIS — E039 Hypothyroidism, unspecified: Secondary | ICD-10-CM

## 2019-02-18 DIAGNOSIS — Z95828 Presence of other vascular implants and grafts: Secondary | ICD-10-CM

## 2019-02-18 DIAGNOSIS — Z5112 Encounter for antineoplastic immunotherapy: Secondary | ICD-10-CM | POA: Diagnosis not present

## 2019-02-18 DIAGNOSIS — R110 Nausea: Secondary | ICD-10-CM | POA: Diagnosis not present

## 2019-02-18 DIAGNOSIS — C8318 Mantle cell lymphoma, lymph nodes of multiple sites: Secondary | ICD-10-CM | POA: Diagnosis not present

## 2019-02-18 DIAGNOSIS — Z452 Encounter for adjustment and management of vascular access device: Secondary | ICD-10-CM | POA: Diagnosis not present

## 2019-02-18 DIAGNOSIS — E669 Obesity, unspecified: Secondary | ICD-10-CM | POA: Diagnosis not present

## 2019-02-18 DIAGNOSIS — Z5189 Encounter for other specified aftercare: Secondary | ICD-10-CM | POA: Diagnosis not present

## 2019-02-18 DIAGNOSIS — M810 Age-related osteoporosis without current pathological fracture: Secondary | ICD-10-CM | POA: Diagnosis not present

## 2019-02-18 DIAGNOSIS — Z5111 Encounter for antineoplastic chemotherapy: Secondary | ICD-10-CM

## 2019-02-18 DIAGNOSIS — C8310 Mantle cell lymphoma, unspecified site: Secondary | ICD-10-CM

## 2019-02-18 LAB — CBC WITH DIFFERENTIAL (CANCER CENTER ONLY)
Abs Immature Granulocytes: 0.24 10*3/uL — ABNORMAL HIGH (ref 0.00–0.07)
Basophils Absolute: 0.1 10*3/uL (ref 0.0–0.1)
Basophils Relative: 1 %
Eosinophils Absolute: 0.1 10*3/uL (ref 0.0–0.5)
Eosinophils Relative: 1 %
HCT: 38.6 % (ref 36.0–46.0)
Hemoglobin: 12.3 g/dL (ref 12.0–15.0)
Immature Granulocytes: 3 %
Lymphocytes Relative: 16 %
Lymphs Abs: 1.4 10*3/uL (ref 0.7–4.0)
MCH: 26.7 pg (ref 26.0–34.0)
MCHC: 31.9 g/dL (ref 30.0–36.0)
MCV: 83.7 fL (ref 80.0–100.0)
Monocytes Absolute: 1 10*3/uL (ref 0.1–1.0)
Monocytes Relative: 11 %
Neutro Abs: 5.7 10*3/uL (ref 1.7–7.7)
Neutrophils Relative %: 68 %
Platelet Count: 320 10*3/uL (ref 150–400)
RBC: 4.61 MIL/uL (ref 3.87–5.11)
RDW: 15.8 % — ABNORMAL HIGH (ref 11.5–15.5)
WBC Count: 8.5 10*3/uL (ref 4.0–10.5)
nRBC: 0 % (ref 0.0–0.2)

## 2019-02-18 LAB — CMP (CANCER CENTER ONLY)
ALT: 31 U/L (ref 0–44)
AST: 25 U/L (ref 15–41)
Albumin: 3.8 g/dL (ref 3.5–5.0)
Alkaline Phosphatase: 87 U/L (ref 38–126)
Anion gap: 8 (ref 5–15)
BUN: 13 mg/dL (ref 8–23)
CO2: 27 mmol/L (ref 22–32)
Calcium: 9.3 mg/dL (ref 8.9–10.3)
Chloride: 106 mmol/L (ref 98–111)
Creatinine: 0.79 mg/dL (ref 0.44–1.00)
GFR, Est AFR Am: >60 mL/min (ref >60)
GFR, Estimated: >60 mL/min (ref >60)
Glucose, Bld: 95 mg/dL (ref 70–99)
Potassium: 4 mmol/L (ref 3.5–5.1)
Sodium: 141 mmol/L (ref 135–145)
Total Bilirubin: 0.3 mg/dL (ref 0.3–1.2)
Total Protein: 6.9 g/dL (ref 6.5–8.1)

## 2019-02-18 LAB — URIC ACID: Uric Acid, Serum: 4.5 mg/dL (ref 2.5–7.1)

## 2019-02-18 LAB — LACTATE DEHYDROGENASE: LDH: 215 U/L — ABNORMAL HIGH (ref 98–192)

## 2019-02-18 MED ORDER — DOXORUBICIN HCL CHEMO IV INJECTION 2 MG/ML
50.0000 mg/m2 | Freq: Once | INTRAVENOUS | Status: AC
  Administered 2019-02-18: 100 mg via INTRAVENOUS
  Filled 2019-02-18: qty 50

## 2019-02-18 MED ORDER — SODIUM CHLORIDE 0.9 % IV SOLN
150.0000 mg | Freq: Once | INTRAVENOUS | Status: AC
  Administered 2019-02-18: 10:00:00 150 mg via INTRAVENOUS
  Filled 2019-02-18: qty 150

## 2019-02-18 MED ORDER — DEXAMETHASONE SODIUM PHOSPHATE 10 MG/ML IJ SOLN
INTRAMUSCULAR | Status: AC
  Filled 2019-02-18: qty 1

## 2019-02-18 MED ORDER — SODIUM CHLORIDE 0.9% FLUSH
10.0000 mL | INTRAVENOUS | Status: DC | PRN
  Administered 2019-02-18: 10 mL
  Filled 2019-02-18: qty 10

## 2019-02-18 MED ORDER — DIPHENHYDRAMINE HCL 25 MG PO CAPS
ORAL_CAPSULE | ORAL | Status: AC
  Filled 2019-02-18: qty 2

## 2019-02-18 MED ORDER — ACETAMINOPHEN 325 MG PO TABS
ORAL_TABLET | ORAL | Status: AC
  Filled 2019-02-18: qty 2

## 2019-02-18 MED ORDER — ACETAMINOPHEN 325 MG PO TABS
650.0000 mg | ORAL_TABLET | Freq: Once | ORAL | Status: AC
  Administered 2019-02-18: 10:00:00 650 mg via ORAL

## 2019-02-18 MED ORDER — VINCRISTINE SULFATE CHEMO INJECTION 1 MG/ML
2.0000 mg | Freq: Once | INTRAVENOUS | Status: AC
  Administered 2019-02-18: 2 mg via INTRAVENOUS
  Filled 2019-02-18: qty 2

## 2019-02-18 MED ORDER — HEPARIN SOD (PORK) LOCK FLUSH 100 UNIT/ML IV SOLN
500.0000 [IU] | Freq: Once | INTRAVENOUS | Status: AC | PRN
  Administered 2019-02-18: 14:00:00 500 [IU]
  Filled 2019-02-18: qty 5

## 2019-02-18 MED ORDER — DIPHENHYDRAMINE HCL 25 MG PO CAPS
50.0000 mg | ORAL_CAPSULE | Freq: Once | ORAL | Status: AC
  Administered 2019-02-18: 50 mg via ORAL

## 2019-02-18 MED ORDER — PALONOSETRON HCL INJECTION 0.25 MG/5ML
INTRAVENOUS | Status: AC
  Filled 2019-02-18: qty 5

## 2019-02-18 MED ORDER — PALONOSETRON HCL INJECTION 0.25 MG/5ML
0.2500 mg | Freq: Once | INTRAVENOUS | Status: AC
  Administered 2019-02-18: 10:00:00 0.25 mg via INTRAVENOUS

## 2019-02-18 MED ORDER — SODIUM CHLORIDE 0.9 % IV SOLN
750.0000 mg/m2 | Freq: Once | INTRAVENOUS | Status: AC
  Administered 2019-02-18: 1500 mg via INTRAVENOUS
  Filled 2019-02-18: qty 75

## 2019-02-18 MED ORDER — SODIUM CHLORIDE 0.9 % IV SOLN
375.0000 mg/m2 | Freq: Once | INTRAVENOUS | Status: AC
  Administered 2019-02-18: 12:00:00 700 mg via INTRAVENOUS
  Filled 2019-02-18: qty 50

## 2019-02-18 MED ORDER — SODIUM CHLORIDE 0.9 % IV SOLN
Freq: Once | INTRAVENOUS | Status: AC
  Filled 2019-02-18: qty 250

## 2019-02-18 MED ORDER — DEXAMETHASONE SODIUM PHOSPHATE 10 MG/ML IJ SOLN
10.0000 mg | Freq: Once | INTRAMUSCULAR | Status: AC
  Administered 2019-02-18: 10:00:00 10 mg via INTRAVENOUS

## 2019-02-18 NOTE — Patient Instructions (Signed)
[facility] Discharge Instructions for Patients Receiving Chemotherapy  Today you received the following chemotherapy agents: doxorubicin, vincristine, cyclophosphamide, and rituximab.  To help prevent nausea and vomiting after your treatment, we encourage you to take your nausea medication as directed.   If you develop nausea and vomiting that is not controlled by your nausea medication, call the clinic.   BELOW ARE SYMPTOMS THAT SHOULD BE REPORTED IMMEDIATELY:  *FEVER GREATER THAN 100.5 F  *CHILLS WITH OR WITHOUT FEVER  NAUSEA AND VOMITING THAT IS NOT CONTROLLED WITH YOUR NAUSEA MEDICATION  *UNUSUAL SHORTNESS OF BREATH  *UNUSUAL BRUISING OR BLEEDING  TENDERNESS IN MOUTH AND THROAT WITH OR WITHOUT PRESENCE OF ULCERS  *URINARY PROBLEMS  *BOWEL PROBLEMS  UNUSUAL RASH Items with * indicate a potential emergency and should be followed up as soon as possible.  Feel free to call the clinic should you have any questions or concerns. The clinic phone number is [phone].  Please show the CHEMO ALERT CARD at check-in to the Emergency Department and triage nurse.   

## 2019-02-18 NOTE — Progress Notes (Signed)
[place] Telephone:([phone]) [phone]   [phone]  OFFICE PROGRESS NOTE  [name], MD [address] [place] [address]  DIAGNOSIS: Stage IV non-Hodgkin's lymphoma, mantle cell lymphoma. She presented with left iliac and retroperitoneal lymphadenopathy. She also presented with left axillary lymphadenopathy and bone marrow involvement. She was diagnosed in December 2020.  PRIOR THERAPY: None  CURRENT THERAPY: Systemic chemotherapy with R-CHOPwithNeulasta support. First dose on 01/27/2019. Status post 1 cycle.   INTERVAL HISTORY: Natalie Baker 65 y.o. female returns to the clinic today for follow-up visit. The patient is feeling fine today with no concerning complaints except for intermittent nausea and she is currently using Compazine on as-needed basis. 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 She denied having any recent weight loss or night sweats. She has no vomiting, diarrhea or constipation. She has no chest pain, shortness of breath, cough or hemoptysis. She tolerated the first cycle of her treatment fairly well. She is here today for evaluation and discussion of her risk her results.  MEDICAL HISTORY: Past Medical History:  Diagnosis Date  . Allergy   . Cancer [facility])    being worked up for [place]  . Colon polyp 12/29/2006  . 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 Hyperlipidemia   . Hypothyroidism   . Low kidney function   . Obesity    BMI 34  . Osteoporosis   . Palpitations   . Vitamin D deficiency     ALLERGIES:  is allergic to ppd [tuberculin purified protein derivative].  MEDICATIONS:  Current Outpatient Medications  Medication Sig Dispense Refill  . allopurinol (ZYLOPRIM) 100 MG tablet Take 1 tablet (100 mg total) by mouth 2 (two) times daily. 60 tablet 3  . ARMOUR THYROID 90 MG tablet Take 90 mg by mouth every morning.    Marland Kitchen estradiol (VIVELLE-DOT) 0.05 MG/24HR patch Place 1 patch onto the skin  2 (two) times a week.    . lidocaine-prilocaine (EMLA) cream Apply 1 application topically as needed. 30 g 0  . ondansetron (ZOFRAN) 8 MG tablet Take 1 tablet (8 mg total) by mouth every 8 (eight) hours as needed for nausea or vomiting. 30 tablet 1  . predniSONE (DELTASONE) 50 MG tablet Please take 2 tablets for 5 consecutive days starting on the first day of chemotherapy every cycle. 40 tablet 2  . prochlorperazine (COMPAZINE) 10 MG tablet Take 1 tablet (10 mg total) by mouth every 6 (six) hours as needed for nausea or vomiting. 30 tablet 3  . progesterone (PROMETRIUM) 100 MG capsule     . rosuvastatin (CRESTOR) 5 MG tablet Start by taking 1 tab by mouth at night once weekly; if tolerating, slowly increase frequency up to taking three nights a week (MWF). (Patient not taking: Reported on 01/27/2019) 30 tablet 1   No current facility-administered medications for this visit.   Facility-Administered Medications Ordered in Other Visits  Medication Dose Route Frequency Provider Last Rate Last Admin  . sodium chloride flush (NS) 0.9 % injection 10 mL  10 mL Intracatheter PRN Curt Bears, MD   10 mL at 02/18/19 [id number]    SURGICAL HISTORY:  Past Surgical History:  Procedure Laterality Date  . ABDOMINAL HYSTERECTOMY  1997  . CESAREAN SECTION  1983  . COLONOSCOPY W/ BIOPSIES  12/29/2006  . IR IMAGING GUIDED PORT INSERTION  01/15/2019  . LAPAROSCOPY     1991 for emdometriosis  . UPPER GASTROINTESTINAL ENDOSCOPY  REVIEW OF SYSTEMS:  A comprehensive review of systems was negative except for: Gastrointestinal: positive for nausea Neurological: positive for paresthesia   PHYSICAL EXAMINATION: General appearance: alert, cooperative and no distress Head: Normocephalic, without obvious abnormality, atraumatic Neck: no adenopathy, no JVD, supple, symmetrical, trachea midline and thyroid not enlarged, symmetric, no tenderness/mass/nodules Lymph nodes: Cervical, supraclavicular, and axillary nodes  normal. Resp: clear to auscultation bilaterally Back: symmetric, no curvature. ROM normal. No CVA tenderness. Cardio: regular rate and rhythm, S1, S2 normal, no murmur, click, rub or gallop GI: soft, non-tender; bowel sounds normal; no masses,  no organomegaly Extremities: extremities normal, atraumatic, no cyanosis or edema  ECOG PERFORMANCE STATUS: 1 - Symptomatic but completely ambulatory  Blood pressure 132/64, pulse 94, temperature 98.5 F (36.9 C), temperature source Temporal, resp. rate 20, height 5\' 2"  (1.575 m), weight 201 lb 4.8 oz (91.3 kg), SpO2 96 %.  LABORATORY DATA: Lab Results  Component Value Date   WBC 14.3 (H) 02/10/2019   HGB 12.9 02/10/2019   HCT 40.8 02/10/2019   MCV 83.8 02/10/2019   PLT 181 02/10/2019      Chemistry      Component Value Date/Time   NA 140 02/10/2019 1020   K 4.0 02/10/2019 1020   CL 104 02/10/2019 1020   CO2 27 02/10/2019 1020   BUN 11 02/10/2019 1020   CREATININE 0.84 02/10/2019 1020   CREATININE 0.74 12/21/2018 1513      Component Value Date/Time   CALCIUM 9.2 02/10/2019 1020   ALKPHOS 129 (H) 02/10/2019 1020   AST 16 02/10/2019 1020   ALT 19 02/10/2019 1020   BILITOT <0.2 (L) 02/10/2019 1020       RADIOGRAPHIC STUDIES: ECHOCARDIOGRAM COMPLETE  Result Date: 01/22/2019   ECHOCARDIOGRAM REPORT   Patient Name:   Natalie Baker Date of Exam: 01/22/2019 Medical Rec #:  [id number]        Height:       62.0 in Accession #:    [id number]       Weight:       199.7 lb Date of Birth:  April 02, 1955       BSA:          1.91 m Patient Age:    57 years         BP:           149/62 mmHg Patient Gender: F                HR:           97 bpm. Exam Location:  Outpatient Procedure: 2D Echo and Strain Analysis Indications:    Chemo V67.2 / Z09  History:        Patient has no prior history of Echocardiogram examinations.                 Risk Factors:Hypertension and Dyslipidemia. Lymphoma.  Sonographer:    Darlina Sicilian RDCS Referring Phys: [id number]  [place]  1. Left ventricular ejection fraction, by visual estimation, is 55 to 60%. The left ventricle has normal function. There is mildly increased left ventricular hypertrophy.  2. Left ventricular diastolic parameters are consistent with Grade I diastolic dysfunction (impaired relaxation).  3. The average left ventricular global longitudinal strain is -13.7 %.  4. The left ventricle has no regional wall motion abnormalities.  5. Global right ventricle has normal systolic function.The right ventricular size is normal. No increase in right ventricular wall thickness.  6. Left atrial size  was normal.  7. Right atrial size was normal.  8. The mitral valve is grossly normal. No evidence of mitral valve regurgitation.  9. The tricuspid valve is grossly normal. 10. The aortic valve is tricuspid. Aortic valve regurgitation is not visualized. 11. The pulmonic valve was grossly normal. Pulmonic valve regurgitation is not visualized. 12. The inferior vena cava is normal in size with greater than 50% respiratory variability, suggesting right atrial pressure of 3 mmHg. FINDINGS  Left Ventricle: Left ventricular ejection fraction, by visual estimation, is 55 to 60%. The left ventricle has normal function. The average left ventricular global longitudinal strain is -13.7 %. The left ventricle has no regional wall motion abnormalities. There is mildly increased left ventricular hypertrophy. Left ventricular diastolic parameters are consistent with Grade I diastolic dysfunction (impaired relaxation). Indeterminate filling pressures. Right Ventricle: The right ventricular size is normal. No increase in right ventricular wall thickness. Global RV systolic function is has normal systolic function. Left Atrium: Left atrial size was normal in size. Right Atrium: Right atrial size was normal in size Pericardium: There is no evidence of pericardial effusion. Mitral Valve: The mitral valve is grossly normal.  No evidence of mitral valve regurgitation. Tricuspid Valve: The tricuspid valve is grossly normal. Tricuspid valve regurgitation is not demonstrated. Aortic Valve: The aortic valve is tricuspid. Aortic valve regurgitation is not visualized. Pulmonic Valve: The pulmonic valve was grossly normal. Pulmonic valve regurgitation is not visualized. Pulmonic regurgitation is not visualized. Aorta: The aortic root and ascending aorta are structurally normal, with no evidence of dilitation. Venous: The inferior vena cava is normal in size with greater than 50% respiratory variability, suggesting right atrial pressure of 3 mmHg. IAS/Shunts: No atrial level shunt detected by color flow Doppler.  LEFT VENTRICLE PLAX 2D LVIDd:         4.60 cm  Diastology LVIDs:         3.20 cm  LV e' lateral:   7.18 cm/s LV PW:         0.90 cm  LV E/e' lateral: 14.3 LV IVS:        1.30 cm  LV e' medial:    6.64 cm/s LVOT diam:     1.70 cm  LV E/e' medial:  15.5 LV SV:         56 ml LV SV Index:   27.71    2D Longitudinal Strain LVOT Area:     2.27 cm 2D Strain GLS Avg:     -13.7 %  RIGHT VENTRICLE RV S prime:     13.70 cm/s TAPSE (M-mode): 2.3 cm LEFT ATRIUM             Index LA diam:        3.30 cm 1.73 cm/m LA Vol (A2C):   15.7 ml 8.22 ml/m LA Vol (A4C):   25.1 ml 13.14 ml/m LA Biplane Vol: 19.9 ml 10.42 ml/m  AORTIC VALVE LVOT Vmax:   86.80 cm/s LVOT Vmean:  61.200 cm/s LVOT VTI:    0.181 m  AORTA Ao Root diam: 2.40 cm MITRAL VALVE MV Area (PHT): 4.96 cm              SHUNTS MV PHT:        44.37 msec            Systemic VTI:  0.18 m MV Decel Time: 153 msec              Systemic Diam: 1.70 cm MV E velocity: 103.00  cm/s 103 cm/s MV A velocity: 112.00 cm/s 70.3 cm/s MV E/A ratio:  0.92        1.5  Lyman Bishop MD Electronically signed by Lyman Bishop MD Signature Date/Time: 01/22/2019/2:41:08 PM    Final     ASSESSMENT AND PLAN: This is a very pleasant 64 years old white female recently diagnosed with stage IV less aggressive mantle cell  lymphoma presented with left iliac and retroperitoneal lymphadenopathy as well as left axillary and bone marrow involvement in December 2020. The patient is currently undergoing systemic chemotherapy with R CHOP status post 1 cycle and she is tolerating the treatment well. I recommended for her to proceed with cycle #2 today as planned. I will see her back for follow-up visit in 3 weeks for evaluation before starting cycle #3. For the nausea, the patient will continue her current treatment with Compazine we'll give her Zofran if needed. She was advised to call immediately if she has any other concerning symptoms in the interval. The patient voices understanding of current disease status and treatment options and is in agreement with the current care plan.  All questions were answered. The patient knows to call the clinic with any problems, questions or concerns. We can certainly see the patient much sooner if necessary.  Disclaimer: This note was dictated with voice recognition software. Similar sounding words can inadvertently be transcribed and may not be corrected upon review.

## 2019-02-19 ENCOUNTER — Telehealth: Payer: Self-pay | Admitting: Internal Medicine

## 2019-02-19 NOTE — Telephone Encounter (Signed)
Scheduled per los. Called and left msg. mailed printout  

## 2019-02-20 ENCOUNTER — Inpatient Hospital Stay: Payer: BC Managed Care – PPO

## 2019-02-20 VITALS — BP 120/67 | HR 100 | Temp 98.4°F | Resp 18

## 2019-02-20 DIAGNOSIS — Z5189 Encounter for other specified aftercare: Secondary | ICD-10-CM | POA: Diagnosis not present

## 2019-02-20 DIAGNOSIS — M810 Age-related osteoporosis without current pathological fracture: Secondary | ICD-10-CM | POA: Diagnosis not present

## 2019-02-20 DIAGNOSIS — Z452 Encounter for adjustment and management of vascular access device: Secondary | ICD-10-CM | POA: Diagnosis not present

## 2019-02-20 DIAGNOSIS — R110 Nausea: Secondary | ICD-10-CM | POA: Diagnosis not present

## 2019-02-20 DIAGNOSIS — C8316 Mantle cell lymphoma, intrapelvic lymph nodes: Secondary | ICD-10-CM

## 2019-02-20 DIAGNOSIS — Z5111 Encounter for antineoplastic chemotherapy: Secondary | ICD-10-CM | POA: Diagnosis not present

## 2019-02-20 DIAGNOSIS — E039 Hypothyroidism, unspecified: Secondary | ICD-10-CM | POA: Diagnosis not present

## 2019-02-20 DIAGNOSIS — C8318 Mantle cell lymphoma, lymph nodes of multiple sites: Secondary | ICD-10-CM | POA: Diagnosis not present

## 2019-02-20 DIAGNOSIS — Z5112 Encounter for antineoplastic immunotherapy: Secondary | ICD-10-CM | POA: Diagnosis not present

## 2019-02-20 DIAGNOSIS — E669 Obesity, unspecified: Secondary | ICD-10-CM | POA: Diagnosis not present

## 2019-02-20 MED ORDER — PEGFILGRASTIM-JMDB 6 MG/0.6ML ~~LOC~~ SOSY
6.0000 mg | PREFILLED_SYRINGE | Freq: Once | SUBCUTANEOUS | Status: AC
  Administered 2019-02-20: 6 mg via SUBCUTANEOUS

## 2019-02-20 NOTE — Patient Instructions (Signed)

## 2019-02-24 ENCOUNTER — Inpatient Hospital Stay: Payer: BC Managed Care – PPO

## 2019-02-24 ENCOUNTER — Other Ambulatory Visit: Payer: Self-pay

## 2019-02-24 DIAGNOSIS — E039 Hypothyroidism, unspecified: Secondary | ICD-10-CM | POA: Diagnosis not present

## 2019-02-24 DIAGNOSIS — Z5111 Encounter for antineoplastic chemotherapy: Secondary | ICD-10-CM | POA: Diagnosis not present

## 2019-02-24 DIAGNOSIS — Z5112 Encounter for antineoplastic immunotherapy: Secondary | ICD-10-CM | POA: Diagnosis not present

## 2019-02-24 DIAGNOSIS — C8318 Mantle cell lymphoma, lymph nodes of multiple sites: Secondary | ICD-10-CM | POA: Diagnosis not present

## 2019-02-24 DIAGNOSIS — Z452 Encounter for adjustment and management of vascular access device: Secondary | ICD-10-CM | POA: Diagnosis not present

## 2019-02-24 DIAGNOSIS — C8310 Mantle cell lymphoma, unspecified site: Secondary | ICD-10-CM

## 2019-02-24 DIAGNOSIS — Z5189 Encounter for other specified aftercare: Secondary | ICD-10-CM | POA: Diagnosis not present

## 2019-02-24 DIAGNOSIS — M810 Age-related osteoporosis without current pathological fracture: Secondary | ICD-10-CM | POA: Diagnosis not present

## 2019-02-24 DIAGNOSIS — E669 Obesity, unspecified: Secondary | ICD-10-CM | POA: Diagnosis not present

## 2019-02-24 DIAGNOSIS — R110 Nausea: Secondary | ICD-10-CM | POA: Diagnosis not present

## 2019-02-24 LAB — CBC WITH DIFFERENTIAL (CANCER CENTER ONLY)
Abs Immature Granulocytes: 0.05 10*3/uL (ref 0.00–0.07)
Basophils Absolute: 0.1 10*3/uL (ref 0.0–0.1)
Basophils Relative: 2 %
Eosinophils Absolute: 0.2 10*3/uL (ref 0.0–0.5)
Eosinophils Relative: 2 %
HCT: 37.4 % (ref 36.0–46.0)
Hemoglobin: 12.1 g/dL (ref 12.0–15.0)
Immature Granulocytes: 1 %
Lymphocytes Relative: 13 %
Lymphs Abs: 1.2 10*3/uL (ref 0.7–4.0)
MCH: 26.8 pg (ref 26.0–34.0)
MCHC: 32.4 g/dL (ref 30.0–36.0)
MCV: 82.9 fL (ref 80.0–100.0)
Monocytes Absolute: 0.5 10*3/uL (ref 0.1–1.0)
Monocytes Relative: 5 %
Neutro Abs: 6.9 10*3/uL (ref 1.7–7.7)
Neutrophils Relative %: 77 %
Platelet Count: 240 10*3/uL (ref 150–400)
RBC: 4.51 MIL/uL (ref 3.87–5.11)
RDW: 16 % — ABNORMAL HIGH (ref 11.5–15.5)
WBC Count: 9 10*3/uL (ref 4.0–10.5)
nRBC: 0 % (ref 0.0–0.2)

## 2019-02-24 LAB — CMP (CANCER CENTER ONLY)
ALT: 20 U/L (ref 0–44)
AST: 12 U/L — ABNORMAL LOW (ref 15–41)
Albumin: 3.8 g/dL (ref 3.5–5.0)
Alkaline Phosphatase: 114 U/L (ref 38–126)
Anion gap: 10 (ref 5–15)
BUN: 17 mg/dL (ref 8–23)
CO2: 27 mmol/L (ref 22–32)
Calcium: 8.9 mg/dL (ref 8.9–10.3)
Chloride: 103 mmol/L (ref 98–111)
Creatinine: 0.73 mg/dL (ref 0.44–1.00)
GFR, Est AFR Am: >60 mL/min (ref >60)
GFR, Estimated: >60 mL/min (ref >60)
Glucose, Bld: 109 mg/dL — ABNORMAL HIGH (ref 70–99)
Potassium: 4 mmol/L (ref 3.5–5.1)
Sodium: 140 mmol/L (ref 135–145)
Total Bilirubin: <0.2 mg/dL — ABNORMAL LOW (ref 0.3–1.2)
Total Protein: 6.5 g/dL (ref 6.5–8.1)

## 2019-02-24 LAB — LACTATE DEHYDROGENASE: LDH: 233 U/L — ABNORMAL HIGH (ref 98–192)

## 2019-03-03 ENCOUNTER — Inpatient Hospital Stay: Payer: BC Managed Care – PPO

## 2019-03-03 ENCOUNTER — Other Ambulatory Visit: Payer: Self-pay

## 2019-03-03 DIAGNOSIS — C8318 Mantle cell lymphoma, lymph nodes of multiple sites: Secondary | ICD-10-CM | POA: Diagnosis not present

## 2019-03-03 DIAGNOSIS — C8310 Mantle cell lymphoma, unspecified site: Secondary | ICD-10-CM

## 2019-03-03 DIAGNOSIS — E039 Hypothyroidism, unspecified: Secondary | ICD-10-CM | POA: Diagnosis not present

## 2019-03-03 DIAGNOSIS — Z95828 Presence of other vascular implants and grafts: Secondary | ICD-10-CM

## 2019-03-03 DIAGNOSIS — Z452 Encounter for adjustment and management of vascular access device: Secondary | ICD-10-CM | POA: Diagnosis not present

## 2019-03-03 DIAGNOSIS — Z5112 Encounter for antineoplastic immunotherapy: Secondary | ICD-10-CM | POA: Diagnosis not present

## 2019-03-03 DIAGNOSIS — E669 Obesity, unspecified: Secondary | ICD-10-CM | POA: Diagnosis not present

## 2019-03-03 DIAGNOSIS — Z5189 Encounter for other specified aftercare: Secondary | ICD-10-CM | POA: Diagnosis not present

## 2019-03-03 DIAGNOSIS — Z5111 Encounter for antineoplastic chemotherapy: Secondary | ICD-10-CM | POA: Diagnosis not present

## 2019-03-03 DIAGNOSIS — M810 Age-related osteoporosis without current pathological fracture: Secondary | ICD-10-CM | POA: Diagnosis not present

## 2019-03-03 DIAGNOSIS — R110 Nausea: Secondary | ICD-10-CM | POA: Diagnosis not present

## 2019-03-03 LAB — CBC WITH DIFFERENTIAL (CANCER CENTER ONLY)
Abs Immature Granulocytes: 3.09 10*3/uL — ABNORMAL HIGH (ref 0.00–0.07)
Basophils Absolute: 0.1 10*3/uL (ref 0.0–0.1)
Basophils Relative: 1 %
Eosinophils Absolute: 0.1 10*3/uL (ref 0.0–0.5)
Eosinophils Relative: 1 %
HCT: 39.1 % (ref 36.0–46.0)
Hemoglobin: 12.3 g/dL (ref 12.0–15.0)
Immature Granulocytes: 21 %
Lymphocytes Relative: 10 %
Lymphs Abs: 1.5 10*3/uL (ref 0.7–4.0)
MCH: 26.6 pg (ref 26.0–34.0)
MCHC: 31.5 g/dL (ref 30.0–36.0)
MCV: 84.4 fL (ref 80.0–100.0)
Monocytes Absolute: 0.9 10*3/uL (ref 0.1–1.0)
Monocytes Relative: 6 %
Neutro Abs: 9.3 10*3/uL — ABNORMAL HIGH (ref 1.7–7.7)
Neutrophils Relative %: 61 %
Platelet Count: 148 10*3/uL — ABNORMAL LOW (ref 150–400)
RBC: 4.63 MIL/uL (ref 3.87–5.11)
RDW: 17.2 % — ABNORMAL HIGH (ref 11.5–15.5)
WBC Count: 15 10*3/uL — ABNORMAL HIGH (ref 4.0–10.5)
nRBC: 0.7 % — ABNORMAL HIGH (ref 0.0–0.2)

## 2019-03-03 LAB — CMP (CANCER CENTER ONLY)
ALT: 15 U/L (ref 0–44)
AST: 15 U/L (ref 15–41)
Albumin: 4 g/dL (ref 3.5–5.0)
Alkaline Phosphatase: 114 U/L (ref 38–126)
Anion gap: 10 (ref 5–15)
BUN: 12 mg/dL (ref 8–23)
CO2: 27 mmol/L (ref 22–32)
Calcium: 9.7 mg/dL (ref 8.9–10.3)
Chloride: 103 mmol/L (ref 98–111)
Creatinine: 0.81 mg/dL (ref 0.44–1.00)
GFR, Est AFR Am: >60 mL/min (ref >60)
GFR, Estimated: >60 mL/min (ref >60)
Glucose, Bld: 91 mg/dL (ref 70–99)
Potassium: 4.2 mmol/L (ref 3.5–5.1)
Sodium: 140 mmol/L (ref 135–145)
Total Bilirubin: <0.2 mg/dL — ABNORMAL LOW (ref 0.3–1.2)
Total Protein: 7.1 g/dL (ref 6.5–8.1)

## 2019-03-03 LAB — LACTATE DEHYDROGENASE: LDH: 294 U/L — ABNORMAL HIGH (ref 98–192)

## 2019-03-03 MED ORDER — HEPARIN SOD (PORK) LOCK FLUSH 100 UNIT/ML IV SOLN
500.0000 [IU] | Freq: Once | INTRAVENOUS | Status: AC | PRN
  Administered 2019-03-03: 14:00:00 500 [IU]
  Filled 2019-03-03: qty 5

## 2019-03-03 MED ORDER — SODIUM CHLORIDE 0.9% FLUSH
10.0000 mL | INTRAVENOUS | Status: DC | PRN
  Administered 2019-03-03: 10 mL
  Filled 2019-03-03: qty 10

## 2019-03-10 ENCOUNTER — Other Ambulatory Visit: Payer: Self-pay | Admitting: Internal Medicine

## 2019-03-10 ENCOUNTER — Inpatient Hospital Stay: Payer: BC Managed Care – PPO

## 2019-03-10 ENCOUNTER — Inpatient Hospital Stay: Payer: BC Managed Care – PPO | Attending: Physician Assistant | Admitting: Internal Medicine

## 2019-03-10 ENCOUNTER — Encounter: Payer: Self-pay | Admitting: Internal Medicine

## 2019-03-10 ENCOUNTER — Other Ambulatory Visit: Payer: Self-pay

## 2019-03-10 ENCOUNTER — Other Ambulatory Visit: Payer: Self-pay | Admitting: Adult Health

## 2019-03-10 VITALS — BP 122/62 | HR 81 | Resp 16

## 2019-03-10 VITALS — BP 113/61 | HR 98 | Temp 97.6°F | Resp 19 | Ht 62.0 in | Wt 205.5 lb

## 2019-03-10 DIAGNOSIS — Z5112 Encounter for antineoplastic immunotherapy: Secondary | ICD-10-CM | POA: Diagnosis not present

## 2019-03-10 DIAGNOSIS — C8310 Mantle cell lymphoma, unspecified site: Secondary | ICD-10-CM

## 2019-03-10 DIAGNOSIS — E039 Hypothyroidism, unspecified: Secondary | ICD-10-CM | POA: Diagnosis not present

## 2019-03-10 DIAGNOSIS — R110 Nausea: Secondary | ICD-10-CM | POA: Insufficient documentation

## 2019-03-10 DIAGNOSIS — Z452 Encounter for adjustment and management of vascular access device: Secondary | ICD-10-CM | POA: Diagnosis not present

## 2019-03-10 DIAGNOSIS — Z5111 Encounter for antineoplastic chemotherapy: Secondary | ICD-10-CM

## 2019-03-10 DIAGNOSIS — Z5189 Encounter for other specified aftercare: Secondary | ICD-10-CM | POA: Insufficient documentation

## 2019-03-10 DIAGNOSIS — C8318 Mantle cell lymphoma, lymph nodes of multiple sites: Secondary | ICD-10-CM | POA: Diagnosis not present

## 2019-03-10 DIAGNOSIS — Z95828 Presence of other vascular implants and grafts: Secondary | ICD-10-CM

## 2019-03-10 DIAGNOSIS — E785 Hyperlipidemia, unspecified: Secondary | ICD-10-CM | POA: Diagnosis not present

## 2019-03-10 DIAGNOSIS — E782 Mixed hyperlipidemia: Secondary | ICD-10-CM

## 2019-03-10 DIAGNOSIS — G629 Polyneuropathy, unspecified: Secondary | ICD-10-CM | POA: Insufficient documentation

## 2019-03-10 DIAGNOSIS — C8316 Mantle cell lymphoma, intrapelvic lymph nodes: Secondary | ICD-10-CM | POA: Diagnosis not present

## 2019-03-10 LAB — URIC ACID: Uric Acid, Serum: 3.9 mg/dL (ref 2.5–7.1)

## 2019-03-10 LAB — CMP (CANCER CENTER ONLY)
ALT: 20 U/L (ref 0–44)
AST: 18 U/L (ref 15–41)
Albumin: 3.6 g/dL (ref 3.5–5.0)
Alkaline Phosphatase: 84 U/L (ref 38–126)
Anion gap: 7 (ref 5–15)
BUN: 13 mg/dL (ref 8–23)
CO2: 27 mmol/L (ref 22–32)
Calcium: 9 mg/dL (ref 8.9–10.3)
Chloride: 107 mmol/L (ref 98–111)
Creatinine: 0.77 mg/dL (ref 0.44–1.00)
GFR, Est AFR Am: >60 mL/min (ref >60)
GFR, Estimated: >60 mL/min (ref >60)
Glucose, Bld: 101 mg/dL — ABNORMAL HIGH (ref 70–99)
Potassium: 4 mmol/L (ref 3.5–5.1)
Sodium: 141 mmol/L (ref 135–145)
Total Bilirubin: 0.2 mg/dL — ABNORMAL LOW (ref 0.3–1.2)
Total Protein: 6.5 g/dL (ref 6.5–8.1)

## 2019-03-10 LAB — CBC WITH DIFFERENTIAL (CANCER CENTER ONLY)
Abs Immature Granulocytes: 0.14 10*3/uL — ABNORMAL HIGH (ref 0.00–0.07)
Basophils Absolute: 0.1 10*3/uL (ref 0.0–0.1)
Basophils Relative: 1 %
Eosinophils Absolute: 0.1 10*3/uL (ref 0.0–0.5)
Eosinophils Relative: 1 %
HCT: 36.5 % (ref 36.0–46.0)
Hemoglobin: 11.6 g/dL — ABNORMAL LOW (ref 12.0–15.0)
Immature Granulocytes: 2 %
Lymphocytes Relative: 12 %
Lymphs Abs: 1 10*3/uL (ref 0.7–4.0)
MCH: 26.9 pg (ref 26.0–34.0)
MCHC: 31.8 g/dL (ref 30.0–36.0)
MCV: 84.5 fL (ref 80.0–100.0)
Monocytes Absolute: 1 10*3/uL (ref 0.1–1.0)
Monocytes Relative: 12 %
Neutro Abs: 6 10*3/uL (ref 1.7–7.7)
Neutrophils Relative %: 72 %
Platelet Count: 253 10*3/uL (ref 150–400)
RBC: 4.32 MIL/uL (ref 3.87–5.11)
RDW: 17.3 % — ABNORMAL HIGH (ref 11.5–15.5)
WBC Count: 8.2 10*3/uL (ref 4.0–10.5)
nRBC: 0.4 % — ABNORMAL HIGH (ref 0.0–0.2)

## 2019-03-10 LAB — LACTATE DEHYDROGENASE: LDH: 205 U/L — ABNORMAL HIGH (ref 98–192)

## 2019-03-10 MED ORDER — HEPARIN SOD (PORK) LOCK FLUSH 100 UNIT/ML IV SOLN
500.0000 [IU] | Freq: Once | INTRAVENOUS | Status: AC | PRN
  Administered 2019-03-10: 500 [IU]
  Filled 2019-03-10: qty 5

## 2019-03-10 MED ORDER — ACETAMINOPHEN 325 MG PO TABS
650.0000 mg | ORAL_TABLET | Freq: Once | ORAL | Status: AC
  Administered 2019-03-10: 650 mg via ORAL

## 2019-03-10 MED ORDER — SODIUM CHLORIDE 0.9% FLUSH
10.0000 mL | INTRAVENOUS | Status: DC | PRN
  Administered 2019-03-10: 10 mL
  Filled 2019-03-10: qty 10

## 2019-03-10 MED ORDER — SODIUM CHLORIDE 0.9 % IV SOLN
750.0000 mg/m2 | Freq: Once | INTRAVENOUS | Status: AC
  Administered 2019-03-10: 1500 mg via INTRAVENOUS
  Filled 2019-03-10: qty 75

## 2019-03-10 MED ORDER — DOXORUBICIN HCL CHEMO IV INJECTION 2 MG/ML
50.0000 mg/m2 | Freq: Once | INTRAVENOUS | Status: AC
  Administered 2019-03-10: 100 mg via INTRAVENOUS
  Filled 2019-03-10: qty 50

## 2019-03-10 MED ORDER — SODIUM CHLORIDE 0.9 % IV SOLN
Freq: Once | INTRAVENOUS | Status: AC
  Filled 2019-03-10: qty 250

## 2019-03-10 MED ORDER — SODIUM CHLORIDE 0.9 % IV SOLN
150.0000 mg | Freq: Once | INTRAVENOUS | Status: AC
  Administered 2019-03-10: 150 mg via INTRAVENOUS
  Filled 2019-03-10: qty 150

## 2019-03-10 MED ORDER — DEXAMETHASONE SODIUM PHOSPHATE 10 MG/ML IJ SOLN
10.0000 mg | Freq: Once | INTRAMUSCULAR | Status: AC
  Administered 2019-03-10: 10 mg via INTRAVENOUS

## 2019-03-10 MED ORDER — SODIUM CHLORIDE 0.9 % IV SOLN
375.0000 mg/m2 | Freq: Once | INTRAVENOUS | Status: AC
  Administered 2019-03-10: 700 mg via INTRAVENOUS
  Filled 2019-03-10: qty 20

## 2019-03-10 MED ORDER — PALONOSETRON HCL INJECTION 0.25 MG/5ML
0.2500 mg | Freq: Once | INTRAVENOUS | Status: AC
  Administered 2019-03-10: 0.25 mg via INTRAVENOUS

## 2019-03-10 MED ORDER — ACETAMINOPHEN 325 MG PO TABS
ORAL_TABLET | ORAL | Status: AC
  Filled 2019-03-10: qty 2

## 2019-03-10 MED ORDER — PALONOSETRON HCL INJECTION 0.25 MG/5ML
INTRAVENOUS | Status: AC
  Filled 2019-03-10: qty 5

## 2019-03-10 MED ORDER — ROSUVASTATIN CALCIUM 5 MG PO TABS: ORAL_TABLET | ORAL | 1 refills | Status: DC

## 2019-03-10 MED ORDER — DIPHENHYDRAMINE HCL 25 MG PO CAPS
50.0000 mg | ORAL_CAPSULE | Freq: Once | ORAL | Status: AC
  Administered 2019-03-10: 50 mg via ORAL

## 2019-03-10 MED ORDER — DEXAMETHASONE SODIUM PHOSPHATE 10 MG/ML IJ SOLN
INTRAMUSCULAR | Status: AC
  Filled 2019-03-10: qty 1

## 2019-03-10 MED ORDER — DIPHENHYDRAMINE HCL 25 MG PO CAPS
ORAL_CAPSULE | ORAL | Status: AC
  Filled 2019-03-10: qty 2

## 2019-03-10 MED ORDER — VINCRISTINE SULFATE CHEMO INJECTION 1 MG/ML
2.0000 mg | Freq: Once | INTRAVENOUS | Status: AC
  Administered 2019-03-10: 2 mg via INTRAVENOUS
  Filled 2019-03-10: qty 2

## 2019-03-10 NOTE — Progress Notes (Signed)
[place] [phone]   [phone]  OFFICE PROGRESS NOTE  [name], MD [address] [place] [address]  DIAGNOSIS: Stage IV non-Hodgkin's lymphoma, mantle cell lymphoma. She presented with left iliac and retroperitoneal lymphadenopathy. She also presented with left axillary lymphadenopathy and bone marrow involvement. She was diagnosed in December 2020.  PRIOR THERAPY: None  CURRENT THERAPY: Systemic chemotherapy with R-CHOPwithNeulasta support. First dose on 01/27/2019. Status post 2 cycles.   INTERVAL HISTORY: Natalie Baker 64 y.o. female returns to the clinic today for follow-up visit.  The patient is feeling fine today with no concerning complaints except for intermittent nausea and she takes Compazine at regular basis.  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  She denied having any chest pain, shortness of breath, cough or hemoptysis.  She denied having any fever or chills.  She has no recent weight loss or night sweats.  She is here today for evaluation before starting cycle #3 of her treatment.  MEDICAL HISTORY: Past Medical History:  Diagnosis Date  . Allergy   . Cancer [facility])    being worked up for Natalie Baker  . Colon polyp 12/29/2006  . 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 Hyperlipidemia   . Hypothyroidism   . Low kidney function   . Obesity    BMI 34  . Osteoporosis   . Palpitations   . Vitamin D deficiency     ALLERGIES:  is allergic to ppd [tuberculin purified protein derivative].  MEDICATIONS:  Current Outpatient Medications  Medication Sig Dispense Refill  . allopurinol (ZYLOPRIM) 100 MG tablet Take 1 tablet (100 mg total) by mouth 2 (two) times daily. 60 tablet 3  . ARMOUR THYROID 90 MG tablet Take 90 mg by mouth every morning.    Marland Kitchen estradiol (VIVELLE-DOT) 0.05 MG/24HR patch Place 1 patch onto the skin 2 (two) times a week.    . lidocaine-prilocaine (EMLA) cream Apply 1 application  topically as needed. 30 g 0  . ondansetron (ZOFRAN) 8 MG tablet Take 1 tablet (8 mg total) by mouth every 8 (eight) hours as needed for nausea or vomiting. (Patient not taking: Reported on 02/18/2019) 30 tablet 1  . predniSONE (DELTASONE) 50 MG tablet Please take 2 tablets for 5 consecutive days starting on the first day of chemotherapy every cycle. 40 tablet 2  . prochlorperazine (COMPAZINE) 10 MG tablet Take 1 tablet (10 mg total) by mouth every 6 (six) hours as needed for nausea or vomiting. 30 tablet 3  . progesterone (PROMETRIUM) 100 MG capsule     . rosuvastatin (CRESTOR) 5 MG tablet Take 1 tablet   3 x /week  (MWF)  for Cholesterol 36 tablet 1   No current facility-administered medications for this visit.    SURGICAL HISTORY:  Past Surgical History:  Procedure Laterality Date  . ABDOMINAL HYSTERECTOMY  1997  . CESAREAN SECTION  1983  . COLONOSCOPY W/ BIOPSIES  12/29/2006  . IR IMAGING GUIDED PORT INSERTION  01/15/2019  . LAPAROSCOPY     1991 for emdometriosis  . UPPER GASTROINTESTINAL ENDOSCOPY      REVIEW OF SYSTEMS:  A comprehensive review of systems was negative except for: Gastrointestinal: positive for nausea Neurological: positive for paresthesia   PHYSICAL EXAMINATION: General appearance: alert, cooperative and no distress Head: Normocephalic, without obvious abnormality, atraumatic Neck: no adenopathy, no JVD, supple, symmetrical, trachea midline and thyroid not enlarged, symmetric, no tenderness/mass/nodules Lymph nodes: Cervical, supraclavicular, and axillary nodes normal.  Resp: clear to auscultation bilaterally Back: symmetric, no curvature. ROM normal. No CVA tenderness. Cardio: regular rate and rhythm, S1, S2 normal, no murmur, click, rub or gallop GI: soft, non-tender; bowel sounds normal; no masses,  no organomegaly Extremities: extremities normal, atraumatic, no cyanosis or edema  ECOG PERFORMANCE STATUS: 1 - Symptomatic but completely ambulatory  Blood  pressure 113/61, pulse 98, temperature 97.6 F (36.4 C), temperature source Oral, resp. rate 19, height 5\' 2"  (1.575 m), weight 205 lb 8 oz (93.2 kg), SpO2 96 %.  LABORATORY DATA: Lab Results  Component Value Date   WBC 8.2 03/10/2019   HGB 11.6 (L) 03/10/2019   HCT 36.5 03/10/2019   MCV 84.5 03/10/2019   PLT 253 03/10/2019      Chemistry      Component Value Date/Time   NA 140 03/03/2019 1337   K 4.2 03/03/2019 1337   CL 103 03/03/2019 1337   CO2 27 03/03/2019 1337   BUN 12 03/03/2019 1337   CREATININE 0.81 03/03/2019 1337   CREATININE 0.74 12/21/2018 1513      Component Value Date/Time   CALCIUM 9.7 03/03/2019 1337   ALKPHOS 114 03/03/2019 1337   AST 15 03/03/2019 1337   ALT 15 03/03/2019 1337   BILITOT <0.2 (L) 03/03/2019 1337       RADIOGRAPHIC STUDIES: No results found.  ASSESSMENT AND PLAN: This is a very pleasant 64 years old white female recently diagnosed with stage IV less aggressive mantle cell lymphoma presented with left iliac and retroperitoneal lymphadenopathy as well as left axillary and bone marrow involvement in December 2020. The patient is currently undergoing systemic chemotherapy with R CHOP status post 2 cycles. She continues to tolerate her treatment well except for the intermittent nausea and mild peripheral neuropathy. I recommended for her to proceed with cycle #3 today as planned. I will see her back for follow-up visit in 3 weeks for evaluation with repeat CT scan of the chest, abdomen pelvis for restaging of her disease. The patient was advised to call immediately if she has any concerning symptoms in the interval. The patient voices understanding of current disease status and treatment options and is in agreement with the current care plan.  All questions were answered. The patient knows to call the clinic with any problems, questions or concerns. We can certainly see the patient much sooner if necessary.  Disclaimer: This note was  dictated with voice recognition software. Similar sounding words can inadvertently be transcribed and may not be corrected upon review.

## 2019-03-10 NOTE — Patient Instructions (Signed)
[facility] Discharge Instructions for Patients Receiving Chemotherapy  Today you received the following chemotherapy agents: rituximab, doxorubicin, vincristine, and cyclophosphamide.  To help prevent nausea and vomiting after your treatment, we encourage you to take your nausea medication as directed.   If you develop nausea and vomiting that is not controlled by your nausea medication, call the clinic.   BELOW ARE SYMPTOMS THAT SHOULD BE REPORTED IMMEDIATELY:  *FEVER GREATER THAN 100.5 F  *CHILLS WITH OR WITHOUT FEVER  NAUSEA AND VOMITING THAT IS NOT CONTROLLED WITH YOUR NAUSEA MEDICATION  *UNUSUAL SHORTNESS OF BREATH  *UNUSUAL BRUISING OR BLEEDING  TENDERNESS IN MOUTH AND THROAT WITH OR WITHOUT PRESENCE OF ULCERS  *URINARY PROBLEMS  *BOWEL PROBLEMS  UNUSUAL RASH Items with * indicate a potential emergency and should be followed up as soon as possible.  Feel free to call the clinic should you have any questions or concerns. The clinic phone number is [phone].  Please show the CHEMO ALERT CARD at check-in to the Emergency Department and triage nurse.   

## 2019-03-12 ENCOUNTER — Inpatient Hospital Stay: Payer: BC Managed Care – PPO

## 2019-03-12 ENCOUNTER — Other Ambulatory Visit: Payer: Self-pay

## 2019-03-12 VITALS — BP 121/62 | HR 99 | Temp 98.3°F | Resp 18

## 2019-03-12 DIAGNOSIS — C8318 Mantle cell lymphoma, lymph nodes of multiple sites: Secondary | ICD-10-CM | POA: Diagnosis not present

## 2019-03-12 DIAGNOSIS — Z452 Encounter for adjustment and management of vascular access device: Secondary | ICD-10-CM | POA: Diagnosis not present

## 2019-03-12 DIAGNOSIS — E039 Hypothyroidism, unspecified: Secondary | ICD-10-CM | POA: Diagnosis not present

## 2019-03-12 DIAGNOSIS — Z5112 Encounter for antineoplastic immunotherapy: Secondary | ICD-10-CM | POA: Diagnosis not present

## 2019-03-12 DIAGNOSIS — R110 Nausea: Secondary | ICD-10-CM | POA: Diagnosis not present

## 2019-03-12 DIAGNOSIS — Z5189 Encounter for other specified aftercare: Secondary | ICD-10-CM | POA: Diagnosis not present

## 2019-03-12 DIAGNOSIS — C8316 Mantle cell lymphoma, intrapelvic lymph nodes: Secondary | ICD-10-CM

## 2019-03-12 DIAGNOSIS — G629 Polyneuropathy, unspecified: Secondary | ICD-10-CM | POA: Diagnosis not present

## 2019-03-12 DIAGNOSIS — Z5111 Encounter for antineoplastic chemotherapy: Secondary | ICD-10-CM | POA: Diagnosis not present

## 2019-03-12 DIAGNOSIS — E785 Hyperlipidemia, unspecified: Secondary | ICD-10-CM | POA: Diagnosis not present

## 2019-03-12 MED ORDER — PEGFILGRASTIM-JMDB 6 MG/0.6ML ~~LOC~~ SOSY
PREFILLED_SYRINGE | SUBCUTANEOUS | Status: AC
  Filled 2019-03-12: qty 0.6

## 2019-03-12 MED ORDER — PEGFILGRASTIM-JMDB 6 MG/0.6ML ~~LOC~~ SOSY
6.0000 mg | PREFILLED_SYRINGE | Freq: Once | SUBCUTANEOUS | Status: AC
  Administered 2019-03-12: 6 mg via SUBCUTANEOUS

## 2019-03-12 NOTE — Patient Instructions (Signed)

## 2019-03-17 ENCOUNTER — Inpatient Hospital Stay: Payer: BC Managed Care – PPO

## 2019-03-17 ENCOUNTER — Other Ambulatory Visit: Payer: Self-pay

## 2019-03-17 DIAGNOSIS — R110 Nausea: Secondary | ICD-10-CM | POA: Diagnosis not present

## 2019-03-17 DIAGNOSIS — Z5111 Encounter for antineoplastic chemotherapy: Secondary | ICD-10-CM | POA: Diagnosis not present

## 2019-03-17 DIAGNOSIS — G629 Polyneuropathy, unspecified: Secondary | ICD-10-CM | POA: Diagnosis not present

## 2019-03-17 DIAGNOSIS — E785 Hyperlipidemia, unspecified: Secondary | ICD-10-CM | POA: Diagnosis not present

## 2019-03-17 DIAGNOSIS — C8310 Mantle cell lymphoma, unspecified site: Secondary | ICD-10-CM

## 2019-03-17 DIAGNOSIS — Z452 Encounter for adjustment and management of vascular access device: Secondary | ICD-10-CM | POA: Diagnosis not present

## 2019-03-17 DIAGNOSIS — Z5112 Encounter for antineoplastic immunotherapy: Secondary | ICD-10-CM | POA: Diagnosis not present

## 2019-03-17 DIAGNOSIS — C8318 Mantle cell lymphoma, lymph nodes of multiple sites: Secondary | ICD-10-CM | POA: Diagnosis not present

## 2019-03-17 DIAGNOSIS — E039 Hypothyroidism, unspecified: Secondary | ICD-10-CM | POA: Diagnosis not present

## 2019-03-17 DIAGNOSIS — Z95828 Presence of other vascular implants and grafts: Secondary | ICD-10-CM

## 2019-03-17 DIAGNOSIS — Z5189 Encounter for other specified aftercare: Secondary | ICD-10-CM | POA: Diagnosis not present

## 2019-03-17 LAB — CMP (CANCER CENTER ONLY)
ALT: 16 U/L (ref 0–44)
AST: 13 U/L — ABNORMAL LOW (ref 15–41)
Albumin: 3.9 g/dL (ref 3.5–5.0)
Alkaline Phosphatase: 125 U/L (ref 38–126)
Anion gap: 9 (ref 5–15)
BUN: 13 mg/dL (ref 8–23)
CO2: 29 mmol/L (ref 22–32)
Calcium: 9.7 mg/dL (ref 8.9–10.3)
Chloride: 102 mmol/L (ref 98–111)
Creatinine: 0.77 mg/dL (ref 0.44–1.00)
GFR, Est AFR Am: >60 mL/min (ref >60)
GFR, Estimated: >60 mL/min (ref >60)
Glucose, Bld: 91 mg/dL (ref 70–99)
Potassium: 4.4 mmol/L (ref 3.5–5.1)
Sodium: 140 mmol/L (ref 135–145)
Total Bilirubin: 0.3 mg/dL (ref 0.3–1.2)
Total Protein: 6.7 g/dL (ref 6.5–8.1)

## 2019-03-17 LAB — CBC WITH DIFFERENTIAL (CANCER CENTER ONLY)
Abs Immature Granulocytes: 0.33 10*3/uL — ABNORMAL HIGH (ref 0.00–0.07)
Basophils Absolute: 0 10*3/uL (ref 0.0–0.1)
Basophils Relative: 0 %
Eosinophils Absolute: 0.3 10*3/uL (ref 0.0–0.5)
Eosinophils Relative: 4 %
HCT: 39 % (ref 36.0–46.0)
Hemoglobin: 12.5 g/dL (ref 12.0–15.0)
Immature Granulocytes: 4 %
Lymphocytes Relative: 12 %
Lymphs Abs: 1.1 10*3/uL (ref 0.7–4.0)
MCH: 27.2 pg (ref 26.0–34.0)
MCHC: 32.1 g/dL (ref 30.0–36.0)
MCV: 84.8 fL (ref 80.0–100.0)
Monocytes Absolute: 0.8 10*3/uL (ref 0.1–1.0)
Monocytes Relative: 9 %
Neutro Abs: 6.4 10*3/uL (ref 1.7–7.7)
Neutrophils Relative %: 71 %
Platelet Count: 306 10*3/uL (ref 150–400)
RBC: 4.6 MIL/uL (ref 3.87–5.11)
RDW: 17.4 % — ABNORMAL HIGH (ref 11.5–15.5)
WBC Count: 9 10*3/uL (ref 4.0–10.5)
nRBC: 0 % (ref 0.0–0.2)

## 2019-03-17 LAB — LACTATE DEHYDROGENASE: LDH: 281 U/L — ABNORMAL HIGH (ref 98–192)

## 2019-03-17 MED ORDER — SODIUM CHLORIDE 0.9% FLUSH
10.0000 mL | INTRAVENOUS | Status: DC | PRN
  Administered 2019-03-17: 10 mL
  Filled 2019-03-17: qty 10

## 2019-03-17 MED ORDER — HEPARIN SOD (PORK) LOCK FLUSH 100 UNIT/ML IV SOLN
500.0000 [IU] | Freq: Once | INTRAVENOUS | Status: AC | PRN
  Administered 2019-03-17: 500 [IU]
  Filled 2019-03-17: qty 5

## 2019-03-18 ENCOUNTER — Encounter: Payer: Self-pay | Admitting: Internal Medicine

## 2019-03-19 ENCOUNTER — Telehealth: Payer: Self-pay

## 2019-03-19 NOTE — Telephone Encounter (Signed)
TCT patient regarding her scheduled CT scan but no response, LVM.  TCT patient's husband Ronalee Belts, informed him of appt. date and time and that details have been provided in response to patient's MyChart message, he verbalized understanding.

## 2019-03-24 ENCOUNTER — Inpatient Hospital Stay: Payer: BC Managed Care – PPO

## 2019-03-24 ENCOUNTER — Other Ambulatory Visit: Payer: Self-pay

## 2019-03-24 DIAGNOSIS — E039 Hypothyroidism, unspecified: Secondary | ICD-10-CM | POA: Diagnosis not present

## 2019-03-24 DIAGNOSIS — C8318 Mantle cell lymphoma, lymph nodes of multiple sites: Secondary | ICD-10-CM | POA: Diagnosis not present

## 2019-03-24 DIAGNOSIS — G629 Polyneuropathy, unspecified: Secondary | ICD-10-CM | POA: Diagnosis not present

## 2019-03-24 DIAGNOSIS — Z5112 Encounter for antineoplastic immunotherapy: Secondary | ICD-10-CM | POA: Diagnosis not present

## 2019-03-24 DIAGNOSIS — E785 Hyperlipidemia, unspecified: Secondary | ICD-10-CM | POA: Diagnosis not present

## 2019-03-24 DIAGNOSIS — C8310 Mantle cell lymphoma, unspecified site: Secondary | ICD-10-CM

## 2019-03-24 DIAGNOSIS — R110 Nausea: Secondary | ICD-10-CM | POA: Diagnosis not present

## 2019-03-24 DIAGNOSIS — Z452 Encounter for adjustment and management of vascular access device: Secondary | ICD-10-CM | POA: Diagnosis not present

## 2019-03-24 DIAGNOSIS — Z5111 Encounter for antineoplastic chemotherapy: Secondary | ICD-10-CM | POA: Diagnosis not present

## 2019-03-24 DIAGNOSIS — Z5189 Encounter for other specified aftercare: Secondary | ICD-10-CM | POA: Diagnosis not present

## 2019-03-24 DIAGNOSIS — Z95828 Presence of other vascular implants and grafts: Secondary | ICD-10-CM

## 2019-03-24 LAB — CBC WITH DIFFERENTIAL (CANCER CENTER ONLY)
Abs Immature Granulocytes: 1.93 10*3/uL — ABNORMAL HIGH (ref 0.00–0.07)
Basophils Absolute: 0.3 10*3/uL — ABNORMAL HIGH (ref 0.0–0.1)
Basophils Relative: 2 %
Eosinophils Absolute: 0.1 10*3/uL (ref 0.0–0.5)
Eosinophils Relative: 1 %
HCT: 37.9 % (ref 36.0–46.0)
Hemoglobin: 12 g/dL (ref 12.0–15.0)
Immature Granulocytes: 17 %
Lymphocytes Relative: 10 %
Lymphs Abs: 1.1 10*3/uL (ref 0.7–4.0)
MCH: 26.7 pg (ref 26.0–34.0)
MCHC: 31.7 g/dL (ref 30.0–36.0)
MCV: 84.2 fL (ref 80.0–100.0)
Monocytes Absolute: 0.8 10*3/uL (ref 0.1–1.0)
Monocytes Relative: 7 %
Neutro Abs: 7.2 10*3/uL (ref 1.7–7.7)
Neutrophils Relative %: 63 %
Platelet Count: 173 10*3/uL (ref 150–400)
RBC: 4.5 MIL/uL (ref 3.87–5.11)
RDW: 18.1 % — ABNORMAL HIGH (ref 11.5–15.5)
WBC Count: 11.4 10*3/uL — ABNORMAL HIGH (ref 4.0–10.5)
nRBC: 0.5 % — ABNORMAL HIGH (ref 0.0–0.2)

## 2019-03-24 LAB — CMP (CANCER CENTER ONLY)
ALT: 15 U/L (ref 0–44)
AST: 19 U/L (ref 15–41)
Albumin: 3.9 g/dL (ref 3.5–5.0)
Alkaline Phosphatase: 120 U/L (ref 38–126)
Anion gap: 9 (ref 5–15)
BUN: 15 mg/dL (ref 8–23)
CO2: 26 mmol/L (ref 22–32)
Calcium: 9.6 mg/dL (ref 8.9–10.3)
Chloride: 105 mmol/L (ref 98–111)
Creatinine: 0.79 mg/dL (ref 0.44–1.00)
GFR, Est AFR Am: >60 mL/min (ref >60)
GFR, Estimated: >60 mL/min (ref >60)
Glucose, Bld: 91 mg/dL (ref 70–99)
Potassium: 4.1 mmol/L (ref 3.5–5.1)
Sodium: 140 mmol/L (ref 135–145)
Total Bilirubin: 0.2 mg/dL — ABNORMAL LOW (ref 0.3–1.2)
Total Protein: 6.9 g/dL (ref 6.5–8.1)

## 2019-03-24 LAB — LACTATE DEHYDROGENASE: LDH: 322 U/L — ABNORMAL HIGH (ref 98–192)

## 2019-03-24 MED ORDER — HEPARIN SOD (PORK) LOCK FLUSH 100 UNIT/ML IV SOLN
500.0000 [IU] | Freq: Once | INTRAVENOUS | Status: AC | PRN
  Administered 2019-03-24: 500 [IU]
  Filled 2019-03-24: qty 5

## 2019-03-24 MED ORDER — SODIUM CHLORIDE 0.9% FLUSH
10.0000 mL | INTRAVENOUS | Status: DC | PRN
  Administered 2019-03-24: 10 mL
  Filled 2019-03-24: qty 10

## 2019-03-26 NOTE — Progress Notes (Signed)
Pharmacist Chemotherapy Monitoring - Follow Up Assessment    I verify that I have reviewed each item in the below checklist:  . Regimen for the patient is scheduled for the appropriate day and plan matches scheduled date. Marland Kitchen Appropriate non-routine labs are ordered dependent on drug ordered. . If applicable, additional medications reviewed and ordered per protocol based on lifetime cumulative doses and/or treatment regimen.   Plan for follow-up and/or issues identified: No . I-vent associated with next due treatment: No . MD and/or nursing notified: No  Natalie Baker 03/26/2019 11:00 AM

## 2019-03-29 ENCOUNTER — Ambulatory Visit (HOSPITAL_COMMUNITY)
Admission: RE | Admit: 2019-03-29 | Discharge: 2019-03-29 | Disposition: A | Discharge status: Home / Self Care | Payer: BC Managed Care – PPO | Source: Ambulatory Visit | Attending: Internal Medicine | Admitting: Internal Medicine

## 2019-03-29 ENCOUNTER — Other Ambulatory Visit: Payer: Self-pay

## 2019-03-29 ENCOUNTER — Encounter (HOSPITAL_COMMUNITY): Payer: Self-pay

## 2019-03-29 DIAGNOSIS — C8316 Mantle cell lymphoma, intrapelvic lymph nodes: Secondary | ICD-10-CM | POA: Insufficient documentation

## 2019-03-29 DIAGNOSIS — C8310 Mantle cell lymphoma, unspecified site: Secondary | ICD-10-CM | POA: Diagnosis not present

## 2019-03-29 MED ORDER — SODIUM CHLORIDE (PF) 0.9 % IJ SOLN
INTRAMUSCULAR | Status: AC
  Filled 2019-03-29: qty 50

## 2019-03-29 MED ORDER — IOHEXOL 300 MG/ML  SOLN
100.0000 mL | Freq: Once | INTRAMUSCULAR | Status: AC | PRN
  Administered 2019-03-29: 100 mL via INTRAVENOUS

## 2019-03-31 NOTE — Progress Notes (Signed)
[name]  [name], MD [address] [place] [address]  DIAGNOSIS: Stage IV non-Hodgkin's lymphoma, mantle cell lymphoma. She presented with left iliac and retroperitoneal lymphadenopathy. She also presented with left axillary lymphadenopathy and bone marrow involvement. She was diagnosed in December 2020.  PRIOR THERAPY: None   CURRENT THERAPY: Systemic chemotherapy with R-CHOPwithNeulasta support.First dose on1/20/2021. Status post 3 cycles.  INTERVAL HISTORY: Natalie Baker 64 y.o. female returns to the clinic for a follow up visit. The patient is feeling well today without any concerning complaints. The patient continues to tolerate treatment with R-CHOP well without any adverse side effects except for nausea. Her nausea is better controlled compared to prior because she takes her anti-emetic twice daily. She also reports feeling a sensation of slow transit with her swallowing for one day before it resolves without intervention. Denies any fever, chills, night sweats, palpable lymphadenopathy, or weight loss. Denies any chest pain, shortness of breath, cough, or hemoptysis. Denies any nausea, vomiting, diarrhea, or constipation. Denies any headache or visual changes. Denies any rashes or skin changes. Denies peripheral neuropathy. The patient is here today for evaluation prior to starting cycle #4   MEDICAL HISTORY: Past Medical History:  Diagnosis Date  . Allergy   . Cancer [facility])    being worked up for Natalie Baker  . Colon polyp 12/29/2006  . 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 Hyperlipidemia   . Hypothyroidism   . Low kidney function   . Obesity    BMI 34  . Osteoporosis   . Palpitations   . Vitamin D deficiency     ALLERGIES:  is allergic to ppd [tuberculin purified protein derivative].  MEDICATIONS:  Current Outpatient Medications  Medication Sig Dispense Refill  . allopurinol (ZYLOPRIM) 100 MG tablet  Take 1 tablet (100 mg total) by mouth 2 (two) times daily. 60 tablet 3  . ARMOUR THYROID 90 MG tablet Take 90 mg by mouth every morning.    Marland Kitchen estradiol (VIVELLE-DOT) 0.05 MG/24HR patch Place 1 patch onto the skin 2 (two) times a week.    . lidocaine-prilocaine (EMLA) cream Apply 1 application topically as needed. 30 g 0  . ondansetron (ZOFRAN) 8 MG tablet Take 1 tablet (8 mg total) by mouth every 8 (eight) hours as needed for nausea or vomiting. (Patient not taking: Reported on 02/18/2019) 30 tablet 1  . predniSONE (DELTASONE) 50 MG tablet Please take 2 tablets for 5 consecutive days starting on the first day of chemotherapy every cycle. 40 tablet 2  . prochlorperazine (COMPAZINE) 10 MG tablet Take 1 tablet (10 mg total) by mouth every 6 (six) hours as needed for nausea or vomiting. 30 tablet 3  . progesterone (PROMETRIUM) 100 MG capsule     . rosuvastatin (CRESTOR) 5 MG tablet Take 1 tablet   3 x /week  (MWF)  for Cholesterol 36 tablet 1   No current facility-administered medications for this visit.    SURGICAL HISTORY:  Past Surgical History:  Procedure Laterality Date  . ABDOMINAL HYSTERECTOMY  1997  . CESAREAN SECTION  1983  . COLONOSCOPY W/ BIOPSIES  12/29/2006  . IR IMAGING GUIDED PORT INSERTION  01/15/2019  . LAPAROSCOPY     1991 for emdometriosis  . UPPER GASTROINTESTINAL ENDOSCOPY      REVIEW OF SYSTEMS:   Review of Systems  Constitutional: Negative for appetite change, chills, fatigue, fever and unexpected weight change.  HENT:   Negative for mouth sores, nosebleeds, sore throat  and trouble swallowing.   Eyes: Negative for eye problems and icterus.  Respiratory: Negative for cough, hemoptysis, shortness of breath and wheezing.  Cardiovascular: Negative for chest pain and leg swelling.  Gastrointestinal: Positive for nausea (improved control). Negative for abdominal pain, constipation, diarrhea, and vomiting.  Genitourinary: Negative for bladder incontinence, difficulty  urinating, dysuria, frequency and hematuria.   Musculoskeletal: Negative for back pain, gait problem, neck pain and neck stiffness.  Skin: Negative for itching and rash.  Neurological: Negative for dizziness, extremity weakness, gait problem, headaches, light-headedness and seizures.  Hematological: Negative for adenopathy. Does not bruise/bleed easily.  Psychiatric/Behavioral: Negative for confusion, depression and sleep disturbance. The patient is not nervous/anxious.     PHYSICAL EXAMINATION:  There were no vitals taken for this visit.  ECOG PERFORMANCE STATUS: 1 - Symptomatic but completely ambulatory  Physical Exam  Constitutional: Oriented to person, place, and time and well-developed, well-nourished, and in no distress.  HENT:  Head: Normocephalic and atraumatic.  Mouth/Throat: Oropharynx is clear and moist. No oropharyngeal exudate.  Eyes: Conjunctivae are normal. Right eye exhibits no discharge. Left eye exhibits no discharge. No scleral icterus.  Neck: Normal range of motion. Neck supple.  Cardiovascular: Normal rate, regular rhythm, normal heart sounds and intact distal pulses.   Pulmonary/Chest: Effort normal and breath sounds normal. No respiratory distress. No wheezes. No rales.  Abdominal: Soft. Bowel sounds are normal. Exhibits no distension and no mass. There is no tenderness.  Musculoskeletal: Normal range of motion. Exhibits no edema.  Lymphadenopathy:    No cervical adenopathy.  Neurological: Alert and oriented to person, place, and time. Exhibits normal muscle tone. Gait normal. Coordination normal.  Skin: Skin is warm and dry. No rash noted. Not diaphoretic. No erythema. No pallor.  Psychiatric: Mood, memory and judgment normal.  Vitals reviewed.  LABORATORY DATA: Lab Results  Component Value Date   WBC 11.4 (H) 03/24/2019   HGB 12.0 03/24/2019   HCT 37.9 03/24/2019   MCV 84.2 03/24/2019   PLT 173 03/24/2019      Chemistry      Component Value  Date/Time   NA 140 03/24/2019 0900   K 4.1 03/24/2019 0900   CL 105 03/24/2019 0900   CO2 26 03/24/2019 0900   BUN 15 03/24/2019 0900   CREATININE 0.79 03/24/2019 0900   CREATININE 0.74 12/21/2018 1513      Component Value Date/Time   CALCIUM 9.6 03/24/2019 0900   ALKPHOS 120 03/24/2019 0900   AST 19 03/24/2019 0900   ALT 15 03/24/2019 0900   BILITOT 0.2 (L) 03/24/2019 0900       RADIOGRAPHIC STUDIES:  CT Chest W Contrast  Result Date: 03/29/2019 CLINICAL DATA:  Restaging mantle cell lymphoma EXAM: CT CHEST, ABDOMEN, AND PELVIS WITH CONTRAST TECHNIQUE: Multidetector CT imaging of the chest, abdomen and pelvis was performed following the standard protocol during bolus administration of intravenous contrast. CONTRAST:  135mL OMNIPAQUE IOHEXOL 300 MG/ML  SOLN COMPARISON:  CT chest, abdomen and pelvis from 12/22/2018 FINDINGS: CT CHEST FINDINGS Cardiovascular: Heart size appears within normal limits. Aortic atherosclerosis. No pericardial effusion identified Mediastinum/Nodes: Normal appearance of the thyroid gland. The trachea appears patent and is midline. Normal appearance of the esophagus. -index left supraclavicular lymph node measures 1.5 cm, image 11/2. Previously this measured the same. -index left axillary node measures the 1.1 cm, image 15/2. Previously this measured the same. -index left axillary lymph node measures 0.9 cm, image 19/2. Previously 1 cm. -index subcarinal node measures 0.7 cm, image 26/2.  Previously 1 cm. -index right axillary node measures 0.6 cm, image 16/2. Previously 1 cm. Lungs/Pleura: No pleural effusion identified. Subsegmental atelectasis within the anterior basal right middle lobe is noted, similar to previous exam. -centrally located right middle lobe lung nodule measures 0.9 cm, image 85/6. Unchanged. Musculoskeletal: No chest wall mass or suspicious bone lesions identified. CT ABDOMEN PELVIS FINDINGS Hepatobiliary: No focal liver abnormality is seen. No  gallstones, gallbladder wall thickening, or biliary dilatation. Pancreas: Unremarkable. No pancreatic ductal dilatation or surrounding inflammatory changes. Spleen: Normal in size without focal abnormality. Adrenals/Urinary Tract: Normal appearance of the adrenal glands. The kidneys are unremarkable. No mass or hydronephrosis identified. Stomach/Bowel: Stomach is within normal limits. Appendix appears normal. No evidence of bowel wall thickening, distention, or inflammatory changes. Vascular/Lymphatic: Aortic atherosclerosis. No aneurysm. -Index left periaortic node measures 1.8 cm, image 69/2. Previously 1.9 cm. -index pre caval lymph node measures 0.8 cm, image 79/2. Previously 0.9 cm. Index left common iliac lymph node measures 1.5 cm, image 85/2. Previously 1.6 cm. -index left external iliac node measures 2.1 cm, image 99/2. Previously 1.5 cm. -Index right pelvic sidewall node measures 0.9 cm, image 101/2. Previously 0.8 cm. Reproductive: Status post hysterectomy. No adnexal masses. Other: No ascites or focal fluid collections. Musculoskeletal: No acute or significant osseous findings. IMPRESSION: 1. Overall there has been no significant interval change in adenopathy within the chest, abdomen or pelvis. 2. Stable appearance of right middle lobe lung nodule measuring 0.9 cm. Aortic Atherosclerosis (ICD10-I70.0). Electronically Signed   By: Kerby Moors M.D.   On: 03/29/2019 13:14   CT Abdomen Pelvis W Contrast  Result Date: 03/29/2019 CLINICAL DATA:  Restaging mantle cell lymphoma EXAM: CT CHEST, ABDOMEN, AND PELVIS WITH CONTRAST TECHNIQUE: Multidetector CT imaging of the chest, abdomen and pelvis was performed following the standard protocol during bolus administration of intravenous contrast. CONTRAST:  134mL OMNIPAQUE IOHEXOL 300 MG/ML  SOLN COMPARISON:  CT chest, abdomen and pelvis from 12/22/2018 FINDINGS: CT CHEST FINDINGS Cardiovascular: Heart size appears within normal limits. Aortic  atherosclerosis. No pericardial effusion identified Mediastinum/Nodes: Normal appearance of the thyroid gland. The trachea appears patent and is midline. Normal appearance of the esophagus. -index left supraclavicular lymph node measures 1.5 cm, image 11/2. Previously this measured the same. -index left axillary node measures the 1.1 cm, image 15/2. Previously this measured the same. -index left axillary lymph node measures 0.9 cm, image 19/2. Previously 1 cm. -index subcarinal node measures 0.7 cm, image 26/2. Previously 1 cm. -index right axillary node measures 0.6 cm, image 16/2. Previously 1 cm. Lungs/Pleura: No pleural effusion identified. Subsegmental atelectasis within the anterior basal right middle lobe is noted, similar to previous exam. -centrally located right middle lobe lung nodule measures 0.9 cm, image 85/6. Unchanged. Musculoskeletal: No chest wall mass or suspicious bone lesions identified. CT ABDOMEN PELVIS FINDINGS Hepatobiliary: No focal liver abnormality is seen. No gallstones, gallbladder wall thickening, or biliary dilatation. Pancreas: Unremarkable. No pancreatic ductal dilatation or surrounding inflammatory changes. Spleen: Normal in size without focal abnormality. Adrenals/Urinary Tract: Normal appearance of the adrenal glands. The kidneys are unremarkable. No mass or hydronephrosis identified. Stomach/Bowel: Stomach is within normal limits. Appendix appears normal. No evidence of bowel wall thickening, distention, or inflammatory changes. Vascular/Lymphatic: Aortic atherosclerosis. No aneurysm. -Index left periaortic node measures 1.8 cm, image 69/2. Previously 1.9 cm. -index pre caval lymph node measures 0.8 cm, image 79/2. Previously 0.9 cm. Index left common iliac lymph node measures 1.5 cm, image 85/2. Previously 1.6 cm. -index left external  iliac node measures 2.1 cm, image 99/2. Previously 1.5 cm. -Index right pelvic sidewall node measures 0.9 cm, image 101/2. Previously 0.8 cm.  Reproductive: Status post hysterectomy. No adnexal masses. Other: No ascites or focal fluid collections. Musculoskeletal: No acute or significant osseous findings. IMPRESSION: 1. Overall there has been no significant interval change in adenopathy within the chest, abdomen or pelvis. 2. Stable appearance of right middle lobe lung nodule measuring 0.9 cm. Aortic Atherosclerosis (ICD10-I70.0). Electronically Signed   By: Kerby Moors M.D.   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 She presented with left iliac, axillary, andretroperitoneal lymphadenopathy. She also presented with bone marrow involvement. She was diagnosed in December 2020.  She is currently undergoing treatment with R-CHOP with Neulasta support. She is status post 3 cycles.   The patient recently had a restaging CT scan. Dr. Julien Nordmann personally and independently reviewed the scan and discussed the results with the patient. The lymphadenopathy appears stable to slightly decreased. Dr. Julien Nordmann recommends that she continue on the same treatment at the same dose. We will arrange for a PET scan after cycle #6.   The patient will proceed with cycle #4 today as scheduled. We will see her back for a follow up visit in 3 weeks for evaluation before starting cycle #5.   The patient was advised to call immediately if she has any concerning symptoms in the interval. The patient voices understanding of current disease status and treatment options and is in agreement with the current care plan. All questions were answered. The patient knows to call the clinic with any problems, questions or concerns. We can certainly see the patient much sooner if necessary   No orders of the defined types were placed in this encounter.    Lilygrace Rodick L Samir Ishaq, PA-C 03/31/19   ADDENDUM: Hematology/Oncology Attending: I  had a face-to-face encounter with the patient.  I recommended her care plan.  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 The patient is currently undergoing systemic chemotherapy with R-CHOP status post 3 cycles. She has been tolerating her treatment well with no concerning adverse effects. The patient had repeat CT scan of the chest, abdomen pelvis that showed stable to mild improvement of her lymphadenopathy. I recommended for the patient to continue her current treatment with the same regimen and she will proceed with cycle #4 today. The patient will come back for follow-up visit in 3 weeks for evaluation before the next cycle of her treatment. She was advised to call immediately if she has any concerning symptoms in the interval.  Disclaimer: This note was dictated with voice recognition software. Similar sounding words can inadvertently be transcribed and may be missed upon review. Eilleen Kempf, MD 04/02/19

## 2019-04-01 ENCOUNTER — Inpatient Hospital Stay: Payer: BC Managed Care – PPO

## 2019-04-01 ENCOUNTER — Other Ambulatory Visit: Payer: Self-pay

## 2019-04-01 ENCOUNTER — Inpatient Hospital Stay (HOSPITAL_BASED_OUTPATIENT_CLINIC_OR_DEPARTMENT_OTHER): Payer: BC Managed Care – PPO | Admitting: Physician Assistant

## 2019-04-01 VITALS — BP 118/64 | HR 97 | Temp 98.3°F | Resp 20

## 2019-04-01 VITALS — BP 114/73 | HR 104 | Temp 97.8°F | Resp 20 | Ht 62.0 in | Wt 202.8 lb

## 2019-04-01 DIAGNOSIS — G629 Polyneuropathy, unspecified: Secondary | ICD-10-CM | POA: Diagnosis not present

## 2019-04-01 DIAGNOSIS — R110 Nausea: Secondary | ICD-10-CM | POA: Diagnosis not present

## 2019-04-01 DIAGNOSIS — Z5111 Encounter for antineoplastic chemotherapy: Secondary | ICD-10-CM

## 2019-04-01 DIAGNOSIS — C8316 Mantle cell lymphoma, intrapelvic lymph nodes: Secondary | ICD-10-CM | POA: Diagnosis not present

## 2019-04-01 DIAGNOSIS — C8310 Mantle cell lymphoma, unspecified site: Secondary | ICD-10-CM

## 2019-04-01 DIAGNOSIS — Z452 Encounter for adjustment and management of vascular access device: Secondary | ICD-10-CM | POA: Diagnosis not present

## 2019-04-01 DIAGNOSIS — Z5189 Encounter for other specified aftercare: Secondary | ICD-10-CM | POA: Diagnosis not present

## 2019-04-01 DIAGNOSIS — Z5112 Encounter for antineoplastic immunotherapy: Secondary | ICD-10-CM | POA: Diagnosis not present

## 2019-04-01 DIAGNOSIS — E039 Hypothyroidism, unspecified: Secondary | ICD-10-CM | POA: Diagnosis not present

## 2019-04-01 DIAGNOSIS — C8318 Mantle cell lymphoma, lymph nodes of multiple sites: Secondary | ICD-10-CM | POA: Diagnosis not present

## 2019-04-01 DIAGNOSIS — E785 Hyperlipidemia, unspecified: Secondary | ICD-10-CM | POA: Diagnosis not present

## 2019-04-01 LAB — CBC WITH DIFFERENTIAL (CANCER CENTER ONLY)
Abs Immature Granulocytes: 0.03 10*3/uL (ref 0.00–0.07)
Basophils Absolute: 0.1 10*3/uL (ref 0.0–0.1)
Basophils Relative: 1 %
Eosinophils Absolute: 0 10*3/uL (ref 0.0–0.5)
Eosinophils Relative: 1 %
HCT: 36.8 % (ref 36.0–46.0)
Hemoglobin: 11.8 g/dL — ABNORMAL LOW (ref 12.0–15.0)
Immature Granulocytes: 1 %
Lymphocytes Relative: 12 %
Lymphs Abs: 0.7 10*3/uL (ref 0.7–4.0)
MCH: 26.9 pg (ref 26.0–34.0)
MCHC: 32.1 g/dL (ref 30.0–36.0)
MCV: 84 fL (ref 80.0–100.0)
Monocytes Absolute: 0.6 10*3/uL (ref 0.1–1.0)
Monocytes Relative: 10 %
Neutro Abs: 4.5 10*3/uL (ref 1.7–7.7)
Neutrophils Relative %: 75 %
Platelet Count: 301 10*3/uL (ref 150–400)
RBC: 4.38 MIL/uL (ref 3.87–5.11)
RDW: 18.3 % — ABNORMAL HIGH (ref 11.5–15.5)
WBC Count: 5.9 10*3/uL (ref 4.0–10.5)
nRBC: 0 % (ref 0.0–0.2)

## 2019-04-01 LAB — CMP (CANCER CENTER ONLY)
ALT: 27 U/L (ref 0–44)
AST: 23 U/L (ref 15–41)
Albumin: 3.9 g/dL (ref 3.5–5.0)
Alkaline Phosphatase: 86 U/L (ref 38–126)
Anion gap: 12 (ref 5–15)
BUN: 13 mg/dL (ref 8–23)
CO2: 26 mmol/L (ref 22–32)
Calcium: 9.5 mg/dL (ref 8.9–10.3)
Chloride: 105 mmol/L (ref 98–111)
Creatinine: 0.81 mg/dL (ref 0.44–1.00)
GFR, Est AFR Am: >60 mL/min (ref >60)
GFR, Estimated: >60 mL/min (ref >60)
Glucose, Bld: 117 mg/dL — ABNORMAL HIGH (ref 70–99)
Potassium: 4.2 mmol/L (ref 3.5–5.1)
Sodium: 143 mmol/L (ref 135–145)
Total Bilirubin: 0.3 mg/dL (ref 0.3–1.2)
Total Protein: 6.8 g/dL (ref 6.5–8.1)

## 2019-04-01 LAB — LACTATE DEHYDROGENASE: LDH: 207 U/L — ABNORMAL HIGH (ref 98–192)

## 2019-04-01 LAB — URIC ACID: Uric Acid, Serum: 3.6 mg/dL (ref 2.5–7.1)

## 2019-04-01 MED ORDER — ACETAMINOPHEN 325 MG PO TABS
650.0000 mg | ORAL_TABLET | Freq: Once | ORAL | Status: AC
  Administered 2019-04-01: 650 mg via ORAL

## 2019-04-01 MED ORDER — SODIUM CHLORIDE 0.9 % IV SOLN
750.0000 mg/m2 | Freq: Once | INTRAVENOUS | Status: AC
  Administered 2019-04-01: 1500 mg via INTRAVENOUS
  Filled 2019-04-01: qty 75

## 2019-04-01 MED ORDER — VINCRISTINE SULFATE CHEMO INJECTION 1 MG/ML
2.0000 mg | Freq: Once | INTRAVENOUS | Status: AC
  Administered 2019-04-01: 2 mg via INTRAVENOUS
  Filled 2019-04-01: qty 2

## 2019-04-01 MED ORDER — PALONOSETRON HCL INJECTION 0.25 MG/5ML
0.2500 mg | Freq: Once | INTRAVENOUS | Status: AC
  Administered 2019-04-01: 0.25 mg via INTRAVENOUS

## 2019-04-01 MED ORDER — ACETAMINOPHEN 325 MG PO TABS
ORAL_TABLET | ORAL | Status: AC
  Filled 2019-04-01: qty 2

## 2019-04-01 MED ORDER — DIPHENHYDRAMINE HCL 25 MG PO CAPS
ORAL_CAPSULE | ORAL | Status: AC
  Filled 2019-04-01: qty 2

## 2019-04-01 MED ORDER — DEXAMETHASONE SODIUM PHOSPHATE 10 MG/ML IJ SOLN
INTRAMUSCULAR | Status: AC
  Filled 2019-04-01: qty 1

## 2019-04-01 MED ORDER — SODIUM CHLORIDE 0.9 % IV SOLN
375.0000 mg/m2 | Freq: Once | INTRAVENOUS | Status: AC
  Administered 2019-04-01: 700 mg via INTRAVENOUS
  Filled 2019-04-01: qty 20

## 2019-04-01 MED ORDER — DEXAMETHASONE SODIUM PHOSPHATE 10 MG/ML IJ SOLN
10.0000 mg | Freq: Once | INTRAMUSCULAR | Status: AC
  Administered 2019-04-01: 10 mg via INTRAVENOUS

## 2019-04-01 MED ORDER — SODIUM CHLORIDE 0.9% FLUSH
10.0000 mL | INTRAVENOUS | Status: DC | PRN
  Administered 2019-04-01: 10 mL
  Filled 2019-04-01: qty 10

## 2019-04-01 MED ORDER — SODIUM CHLORIDE 0.9 % IV SOLN
150.0000 mg | Freq: Once | INTRAVENOUS | Status: AC
  Administered 2019-04-01: 150 mg via INTRAVENOUS
  Filled 2019-04-01: qty 150

## 2019-04-01 MED ORDER — DOXORUBICIN HCL CHEMO IV INJECTION 2 MG/ML
50.0000 mg/m2 | Freq: Once | INTRAVENOUS | Status: AC
  Administered 2019-04-01: 100 mg via INTRAVENOUS
  Filled 2019-04-01: qty 50

## 2019-04-01 MED ORDER — SODIUM CHLORIDE 0.9 % IV SOLN
Freq: Once | INTRAVENOUS | Status: AC
  Filled 2019-04-01: qty 250

## 2019-04-01 MED ORDER — PALONOSETRON HCL INJECTION 0.25 MG/5ML
INTRAVENOUS | Status: AC
  Filled 2019-04-01: qty 5

## 2019-04-01 MED ORDER — DIPHENHYDRAMINE HCL 25 MG PO CAPS
50.0000 mg | ORAL_CAPSULE | Freq: Once | ORAL | Status: AC
  Administered 2019-04-01: 50 mg via ORAL

## 2019-04-01 MED ORDER — HEPARIN SOD (PORK) LOCK FLUSH 100 UNIT/ML IV SOLN
500.0000 [IU] | Freq: Once | INTRAVENOUS | Status: AC | PRN
  Administered 2019-04-01: 500 [IU]
  Filled 2019-04-01: qty 5

## 2019-04-01 NOTE — Patient Instructions (Signed)
[facility] Discharge Instructions for Patients Receiving Chemotherapy  Today you received the following chemotherapy agents: rituximab, doxorubicin, vincristine, and cyclophosphamide.  To help prevent nausea and vomiting after your treatment, we encourage you to take your nausea medication as directed.   If you develop nausea and vomiting that is not controlled by your nausea medication, call the clinic.   BELOW ARE SYMPTOMS THAT SHOULD BE REPORTED IMMEDIATELY:  *FEVER GREATER THAN 100.5 F  *CHILLS WITH OR WITHOUT FEVER  NAUSEA AND VOMITING THAT IS NOT CONTROLLED WITH YOUR NAUSEA MEDICATION  *UNUSUAL SHORTNESS OF BREATH  *UNUSUAL BRUISING OR BLEEDING  TENDERNESS IN MOUTH AND THROAT WITH OR WITHOUT PRESENCE OF ULCERS  *URINARY PROBLEMS  *BOWEL PROBLEMS  UNUSUAL RASH Items with * indicate a potential emergency and should be followed up as soon as possible.  Feel free to call the clinic should you have any questions or concerns. The clinic phone number is [phone].  Please show the CHEMO ALERT CARD at check-in to the Emergency Department and triage nurse.   

## 2019-04-02 ENCOUNTER — Telehealth: Payer: Self-pay | Admitting: Internal Medicine

## 2019-04-02 ENCOUNTER — Encounter: Payer: Self-pay | Admitting: Physician Assistant

## 2019-04-02 NOTE — Telephone Encounter (Signed)
Scheduled per los. Called and spoke with patient. Confirmed appts  

## 2019-04-03 ENCOUNTER — Other Ambulatory Visit: Payer: Self-pay

## 2019-04-03 ENCOUNTER — Inpatient Hospital Stay: Payer: BC Managed Care – PPO

## 2019-04-03 VITALS — BP 109/48 | HR 98 | Temp 98.7°F | Resp 18

## 2019-04-03 DIAGNOSIS — Z5112 Encounter for antineoplastic immunotherapy: Secondary | ICD-10-CM | POA: Diagnosis not present

## 2019-04-03 DIAGNOSIS — C8318 Mantle cell lymphoma, lymph nodes of multiple sites: Secondary | ICD-10-CM | POA: Diagnosis not present

## 2019-04-03 DIAGNOSIS — Z452 Encounter for adjustment and management of vascular access device: Secondary | ICD-10-CM | POA: Diagnosis not present

## 2019-04-03 DIAGNOSIS — R110 Nausea: Secondary | ICD-10-CM | POA: Diagnosis not present

## 2019-04-03 DIAGNOSIS — Z5111 Encounter for antineoplastic chemotherapy: Secondary | ICD-10-CM | POA: Diagnosis not present

## 2019-04-03 DIAGNOSIS — E039 Hypothyroidism, unspecified: Secondary | ICD-10-CM | POA: Diagnosis not present

## 2019-04-03 DIAGNOSIS — Z5189 Encounter for other specified aftercare: Secondary | ICD-10-CM | POA: Diagnosis not present

## 2019-04-03 DIAGNOSIS — G629 Polyneuropathy, unspecified: Secondary | ICD-10-CM | POA: Diagnosis not present

## 2019-04-03 DIAGNOSIS — C8316 Mantle cell lymphoma, intrapelvic lymph nodes: Secondary | ICD-10-CM

## 2019-04-03 DIAGNOSIS — E785 Hyperlipidemia, unspecified: Secondary | ICD-10-CM | POA: Diagnosis not present

## 2019-04-03 MED ORDER — PEGFILGRASTIM-JMDB 6 MG/0.6ML ~~LOC~~ SOSY
6.0000 mg | PREFILLED_SYRINGE | Freq: Once | SUBCUTANEOUS | Status: AC
  Administered 2019-04-03: 11:00:00 6 mg via SUBCUTANEOUS

## 2019-04-03 MED ORDER — PEGFILGRASTIM-JMDB 6 MG/0.6ML ~~LOC~~ SOSY
PREFILLED_SYRINGE | SUBCUTANEOUS | Status: AC
  Filled 2019-04-03: qty 0.6

## 2019-04-04 ENCOUNTER — Other Ambulatory Visit: Payer: Self-pay | Admitting: Physician Assistant

## 2019-04-04 DIAGNOSIS — C8310 Mantle cell lymphoma, unspecified site: Secondary | ICD-10-CM

## 2019-04-08 ENCOUNTER — Other Ambulatory Visit: Payer: Self-pay

## 2019-04-08 ENCOUNTER — Inpatient Hospital Stay: Payer: BC Managed Care – PPO | Attending: Physician Assistant

## 2019-04-08 ENCOUNTER — Inpatient Hospital Stay: Payer: BC Managed Care – PPO

## 2019-04-08 DIAGNOSIS — E039 Hypothyroidism, unspecified: Secondary | ICD-10-CM | POA: Diagnosis not present

## 2019-04-08 DIAGNOSIS — E785 Hyperlipidemia, unspecified: Secondary | ICD-10-CM | POA: Insufficient documentation

## 2019-04-08 DIAGNOSIS — Z5189 Encounter for other specified aftercare: Secondary | ICD-10-CM | POA: Insufficient documentation

## 2019-04-08 DIAGNOSIS — Z5111 Encounter for antineoplastic chemotherapy: Secondary | ICD-10-CM | POA: Insufficient documentation

## 2019-04-08 DIAGNOSIS — Z452 Encounter for adjustment and management of vascular access device: Secondary | ICD-10-CM | POA: Diagnosis not present

## 2019-04-08 DIAGNOSIS — C8310 Mantle cell lymphoma, unspecified site: Secondary | ICD-10-CM

## 2019-04-08 DIAGNOSIS — E669 Obesity, unspecified: Secondary | ICD-10-CM | POA: Insufficient documentation

## 2019-04-08 DIAGNOSIS — Z5112 Encounter for antineoplastic immunotherapy: Secondary | ICD-10-CM | POA: Insufficient documentation

## 2019-04-08 DIAGNOSIS — Z95828 Presence of other vascular implants and grafts: Secondary | ICD-10-CM

## 2019-04-08 DIAGNOSIS — C8318 Mantle cell lymphoma, lymph nodes of multiple sites: Secondary | ICD-10-CM | POA: Insufficient documentation

## 2019-04-08 LAB — CBC WITH DIFFERENTIAL (CANCER CENTER ONLY)
Abs Immature Granulocytes: 0.18 10*3/uL — ABNORMAL HIGH (ref 0.00–0.07)
Basophils Absolute: 0.1 10*3/uL (ref 0.0–0.1)
Basophils Relative: 1 %
Eosinophils Absolute: 0.4 10*3/uL (ref 0.0–0.5)
Eosinophils Relative: 5 %
HCT: 36.2 % (ref 36.0–46.0)
Hemoglobin: 11.7 g/dL — ABNORMAL LOW (ref 12.0–15.0)
Immature Granulocytes: 2 %
Lymphocytes Relative: 15 %
Lymphs Abs: 1.1 10*3/uL (ref 0.7–4.0)
MCH: 27.7 pg (ref 26.0–34.0)
MCHC: 32.3 g/dL (ref 30.0–36.0)
MCV: 85.6 fL (ref 80.0–100.0)
Monocytes Absolute: 0.8 10*3/uL (ref 0.1–1.0)
Monocytes Relative: 11 %
Neutro Abs: 4.8 10*3/uL (ref 1.7–7.7)
Neutrophils Relative %: 66 %
Platelet Count: 178 10*3/uL (ref 150–400)
RBC: 4.23 MIL/uL (ref 3.87–5.11)
RDW: 17.7 % — ABNORMAL HIGH (ref 11.5–15.5)
WBC Count: 7.4 10*3/uL (ref 4.0–10.5)
nRBC: 0 % (ref 0.0–0.2)

## 2019-04-08 LAB — CMP (CANCER CENTER ONLY)
ALT: 20 U/L (ref 0–44)
AST: 11 U/L — ABNORMAL LOW (ref 15–41)
Albumin: 3.8 g/dL (ref 3.5–5.0)
Alkaline Phosphatase: 99 U/L (ref 38–126)
Anion gap: 11 (ref 5–15)
BUN: 12 mg/dL (ref 8–23)
CO2: 28 mmol/L (ref 22–32)
Calcium: 9.3 mg/dL (ref 8.9–10.3)
Chloride: 101 mmol/L (ref 98–111)
Creatinine: 0.81 mg/dL (ref 0.44–1.00)
GFR, Est AFR Am: >60 mL/min (ref >60)
GFR, Estimated: >60 mL/min (ref >60)
Glucose, Bld: 130 mg/dL — ABNORMAL HIGH (ref 70–99)
Potassium: 4 mmol/L (ref 3.5–5.1)
Sodium: 140 mmol/L (ref 135–145)
Total Bilirubin: 0.2 mg/dL — ABNORMAL LOW (ref 0.3–1.2)
Total Protein: 6.5 g/dL (ref 6.5–8.1)

## 2019-04-08 LAB — LACTATE DEHYDROGENASE: LDH: 220 U/L — ABNORMAL HIGH (ref 98–192)

## 2019-04-08 MED ORDER — HEPARIN SOD (PORK) LOCK FLUSH 100 UNIT/ML IV SOLN
500.0000 [IU] | Freq: Once | INTRAVENOUS | Status: AC | PRN
  Administered 2019-04-08: 500 [IU]
  Filled 2019-04-08: qty 5

## 2019-04-08 MED ORDER — SODIUM CHLORIDE 0.9% FLUSH
10.0000 mL | INTRAVENOUS | Status: DC | PRN
  Administered 2019-04-08: 10 mL
  Filled 2019-04-08: qty 10

## 2019-04-15 ENCOUNTER — Other Ambulatory Visit: Payer: Self-pay

## 2019-04-15 ENCOUNTER — Inpatient Hospital Stay: Payer: BC Managed Care – PPO

## 2019-04-15 DIAGNOSIS — E785 Hyperlipidemia, unspecified: Secondary | ICD-10-CM | POA: Diagnosis not present

## 2019-04-15 DIAGNOSIS — E669 Obesity, unspecified: Secondary | ICD-10-CM | POA: Diagnosis not present

## 2019-04-15 DIAGNOSIS — Z95828 Presence of other vascular implants and grafts: Secondary | ICD-10-CM

## 2019-04-15 DIAGNOSIS — C8310 Mantle cell lymphoma, unspecified site: Secondary | ICD-10-CM

## 2019-04-15 DIAGNOSIS — Z5111 Encounter for antineoplastic chemotherapy: Secondary | ICD-10-CM | POA: Diagnosis not present

## 2019-04-15 DIAGNOSIS — Z5112 Encounter for antineoplastic immunotherapy: Secondary | ICD-10-CM | POA: Diagnosis not present

## 2019-04-15 DIAGNOSIS — Z452 Encounter for adjustment and management of vascular access device: Secondary | ICD-10-CM | POA: Diagnosis not present

## 2019-04-15 DIAGNOSIS — C8318 Mantle cell lymphoma, lymph nodes of multiple sites: Secondary | ICD-10-CM | POA: Diagnosis not present

## 2019-04-15 DIAGNOSIS — Z5189 Encounter for other specified aftercare: Secondary | ICD-10-CM | POA: Diagnosis not present

## 2019-04-15 DIAGNOSIS — E039 Hypothyroidism, unspecified: Secondary | ICD-10-CM | POA: Diagnosis not present

## 2019-04-15 LAB — CBC WITH DIFFERENTIAL (CANCER CENTER ONLY)
Abs Immature Granulocytes: 1.62 10*3/uL — ABNORMAL HIGH (ref 0.00–0.07)
Basophils Absolute: 0.1 10*3/uL (ref 0.0–0.1)
Basophils Relative: 1 %
Eosinophils Absolute: 0.1 10*3/uL (ref 0.0–0.5)
Eosinophils Relative: 1 %
HCT: 36.3 % (ref 36.0–46.0)
Hemoglobin: 11.3 g/dL — ABNORMAL LOW (ref 12.0–15.0)
Immature Granulocytes: 13 %
Lymphocytes Relative: 8 %
Lymphs Abs: 1 10*3/uL (ref 0.7–4.0)
MCH: 27.3 pg (ref 26.0–34.0)
MCHC: 31.1 g/dL (ref 30.0–36.0)
MCV: 87.7 fL (ref 80.0–100.0)
Monocytes Absolute: 0.8 10*3/uL (ref 0.1–1.0)
Monocytes Relative: 6 %
Neutro Abs: 8.9 10*3/uL — ABNORMAL HIGH (ref 1.7–7.7)
Neutrophils Relative %: 71 %
Platelet Count: 177 10*3/uL (ref 150–400)
RBC: 4.14 MIL/uL (ref 3.87–5.11)
RDW: 19 % — ABNORMAL HIGH (ref 11.5–15.5)
WBC Count: 12.5 10*3/uL — ABNORMAL HIGH (ref 4.0–10.5)
nRBC: 0.8 % — ABNORMAL HIGH (ref 0.0–0.2)

## 2019-04-15 LAB — CMP (CANCER CENTER ONLY)
ALT: 18 U/L (ref 0–44)
AST: 17 U/L (ref 15–41)
Albumin: 3.7 g/dL (ref 3.5–5.0)
Alkaline Phosphatase: 114 U/L (ref 38–126)
Anion gap: 6 (ref 5–15)
BUN: 11 mg/dL (ref 8–23)
CO2: 28 mmol/L (ref 22–32)
Calcium: 9.4 mg/dL (ref 8.9–10.3)
Chloride: 105 mmol/L (ref 98–111)
Creatinine: 0.91 mg/dL (ref 0.44–1.00)
GFR, Est AFR Am: >60 mL/min (ref >60)
GFR, Estimated: >60 mL/min (ref >60)
Glucose, Bld: 96 mg/dL (ref 70–99)
Potassium: 4.4 mmol/L (ref 3.5–5.1)
Sodium: 139 mmol/L (ref 135–145)
Total Bilirubin: <0.2 mg/dL — ABNORMAL LOW (ref 0.3–1.2)
Total Protein: 6.7 g/dL (ref 6.5–8.1)

## 2019-04-15 LAB — LACTATE DEHYDROGENASE: LDH: 309 U/L — ABNORMAL HIGH (ref 98–192)

## 2019-04-15 MED ORDER — HEPARIN SOD (PORK) LOCK FLUSH 100 UNIT/ML IV SOLN
500.0000 [IU] | Freq: Once | INTRAVENOUS | Status: AC | PRN
  Administered 2019-04-15: 500 [IU]
  Filled 2019-04-15: qty 5

## 2019-04-15 MED ORDER — SODIUM CHLORIDE 0.9% FLUSH
10.0000 mL | INTRAVENOUS | Status: DC | PRN
  Administered 2019-04-15: 10 mL
  Filled 2019-04-15: qty 10

## 2019-04-16 NOTE — Progress Notes (Signed)
Pharmacist Chemotherapy Monitoring - Follow Up Assessment    I verify that I have reviewed each item in the below checklist:  . Regimen for the patient is scheduled for the appropriate day and plan matches scheduled date. Marland Kitchen Appropriate non-routine labs are ordered dependent on drug ordered. . If applicable, additional medications reviewed and ordered per protocol based on lifetime cumulative doses and/or treatment regimen.   Plan for follow-up and/or issues identified: No . I-vent associated with next due treatment: No . MD and/or nursing notified: No  [facility] 04/16/2019 1:37 PM

## 2019-04-20 ENCOUNTER — Other Ambulatory Visit: Payer: Self-pay | Admitting: Physician Assistant

## 2019-04-20 DIAGNOSIS — C8310 Mantle cell lymphoma, unspecified site: Secondary | ICD-10-CM

## 2019-04-21 ENCOUNTER — Inpatient Hospital Stay: Payer: BC Managed Care – PPO

## 2019-04-21 ENCOUNTER — Inpatient Hospital Stay (HOSPITAL_BASED_OUTPATIENT_CLINIC_OR_DEPARTMENT_OTHER): Payer: BC Managed Care – PPO | Admitting: Internal Medicine

## 2019-04-21 ENCOUNTER — Encounter: Payer: Self-pay | Admitting: Internal Medicine

## 2019-04-21 ENCOUNTER — Other Ambulatory Visit: Payer: Self-pay

## 2019-04-21 VITALS — BP 118/53 | HR 105 | Temp 98.3°F | Resp 20 | Ht 62.0 in | Wt 202.9 lb

## 2019-04-21 VITALS — BP 105/58 | HR 100 | Temp 97.5°F | Resp 18

## 2019-04-21 DIAGNOSIS — C8313 Mantle cell lymphoma, intra-abdominal lymph nodes: Secondary | ICD-10-CM | POA: Diagnosis not present

## 2019-04-21 DIAGNOSIS — C8310 Mantle cell lymphoma, unspecified site: Secondary | ICD-10-CM

## 2019-04-21 DIAGNOSIS — Z5111 Encounter for antineoplastic chemotherapy: Secondary | ICD-10-CM

## 2019-04-21 DIAGNOSIS — Z452 Encounter for adjustment and management of vascular access device: Secondary | ICD-10-CM | POA: Diagnosis not present

## 2019-04-21 DIAGNOSIS — E785 Hyperlipidemia, unspecified: Secondary | ICD-10-CM | POA: Diagnosis not present

## 2019-04-21 DIAGNOSIS — C8316 Mantle cell lymphoma, intrapelvic lymph nodes: Secondary | ICD-10-CM

## 2019-04-21 DIAGNOSIS — Z5189 Encounter for other specified aftercare: Secondary | ICD-10-CM | POA: Diagnosis not present

## 2019-04-21 DIAGNOSIS — Z95828 Presence of other vascular implants and grafts: Secondary | ICD-10-CM

## 2019-04-21 DIAGNOSIS — E669 Obesity, unspecified: Secondary | ICD-10-CM | POA: Diagnosis not present

## 2019-04-21 DIAGNOSIS — Z5112 Encounter for antineoplastic immunotherapy: Secondary | ICD-10-CM | POA: Diagnosis not present

## 2019-04-21 DIAGNOSIS — C8318 Mantle cell lymphoma, lymph nodes of multiple sites: Secondary | ICD-10-CM | POA: Diagnosis not present

## 2019-04-21 DIAGNOSIS — E039 Hypothyroidism, unspecified: Secondary | ICD-10-CM | POA: Diagnosis not present

## 2019-04-21 LAB — CMP (CANCER CENTER ONLY)
ALT: 24 U/L (ref 0–44)
AST: 19 U/L (ref 15–41)
Albumin: 3.9 g/dL (ref 3.5–5.0)
Alkaline Phosphatase: 90 U/L (ref 38–126)
Anion gap: 9 (ref 5–15)
BUN: 12 mg/dL (ref 8–23)
CO2: 26 mmol/L (ref 22–32)
Calcium: 9.5 mg/dL (ref 8.9–10.3)
Chloride: 106 mmol/L (ref 98–111)
Creatinine: 0.79 mg/dL (ref 0.44–1.00)
GFR, Est AFR Am: >60 mL/min (ref >60)
GFR, Estimated: >60 mL/min (ref >60)
Glucose, Bld: 104 mg/dL — ABNORMAL HIGH (ref 70–99)
Potassium: 4.3 mmol/L (ref 3.5–5.1)
Sodium: 141 mmol/L (ref 135–145)
Total Bilirubin: 0.2 mg/dL — ABNORMAL LOW (ref 0.3–1.2)
Total Protein: 7 g/dL (ref 6.5–8.1)

## 2019-04-21 LAB — CBC WITH DIFFERENTIAL (CANCER CENTER ONLY)
Abs Immature Granulocytes: 0.16 10*3/uL — ABNORMAL HIGH (ref 0.00–0.07)
Basophils Absolute: 0.1 10*3/uL (ref 0.0–0.1)
Basophils Relative: 1 %
Eosinophils Absolute: 0 10*3/uL (ref 0.0–0.5)
Eosinophils Relative: 0 %
HCT: 36.8 % (ref 36.0–46.0)
Hemoglobin: 11.7 g/dL — ABNORMAL LOW (ref 12.0–15.0)
Immature Granulocytes: 2 %
Lymphocytes Relative: 6 %
Lymphs Abs: 0.6 10*3/uL — ABNORMAL LOW (ref 0.7–4.0)
MCH: 27.5 pg (ref 26.0–34.0)
MCHC: 31.8 g/dL (ref 30.0–36.0)
MCV: 86.4 fL (ref 80.0–100.0)
Monocytes Absolute: 0.3 10*3/uL (ref 0.1–1.0)
Monocytes Relative: 3 %
Neutro Abs: 8.4 10*3/uL — ABNORMAL HIGH (ref 1.7–7.7)
Neutrophils Relative %: 88 %
Platelet Count: 296 10*3/uL (ref 150–400)
RBC: 4.26 MIL/uL (ref 3.87–5.11)
RDW: 19.1 % — ABNORMAL HIGH (ref 11.5–15.5)
WBC Count: 9.5 10*3/uL (ref 4.0–10.5)
nRBC: 0.3 % — ABNORMAL HIGH (ref 0.0–0.2)

## 2019-04-21 LAB — URIC ACID: Uric Acid, Serum: 3.2 mg/dL (ref 2.5–7.1)

## 2019-04-21 LAB — LACTATE DEHYDROGENASE: LDH: 239 U/L — ABNORMAL HIGH (ref 98–192)

## 2019-04-21 MED ORDER — DEXAMETHASONE SODIUM PHOSPHATE 10 MG/ML IJ SOLN
INTRAMUSCULAR | Status: AC
  Filled 2019-04-21: qty 1

## 2019-04-21 MED ORDER — HEPARIN SOD (PORK) LOCK FLUSH 100 UNIT/ML IV SOLN
500.0000 [IU] | Freq: Once | INTRAVENOUS | Status: AC | PRN
  Administered 2019-04-21: 500 [IU]
  Filled 2019-04-21: qty 5

## 2019-04-21 MED ORDER — SODIUM CHLORIDE 0.9 % IV SOLN
10.0000 mg | Freq: Once | INTRAVENOUS | Status: AC
  Administered 2019-04-21: 10 mg via INTRAVENOUS
  Filled 2019-04-21: qty 10

## 2019-04-21 MED ORDER — SODIUM CHLORIDE 0.9 % IV SOLN
750.0000 mg/m2 | Freq: Once | INTRAVENOUS | Status: AC
  Administered 2019-04-21: 1500 mg via INTRAVENOUS
  Filled 2019-04-21: qty 75

## 2019-04-21 MED ORDER — DIPHENHYDRAMINE HCL 25 MG PO CAPS
50.0000 mg | ORAL_CAPSULE | Freq: Once | ORAL | Status: AC
  Administered 2019-04-21: 50 mg via ORAL

## 2019-04-21 MED ORDER — PALONOSETRON HCL INJECTION 0.25 MG/5ML
0.2500 mg | Freq: Once | INTRAVENOUS | Status: AC
  Administered 2019-04-21: 0.25 mg via INTRAVENOUS

## 2019-04-21 MED ORDER — SODIUM CHLORIDE 0.9% FLUSH
10.0000 mL | INTRAVENOUS | Status: DC | PRN
  Administered 2019-04-21: 10 mL
  Filled 2019-04-21: qty 10

## 2019-04-21 MED ORDER — ACETAMINOPHEN 325 MG PO TABS
ORAL_TABLET | ORAL | Status: AC
  Filled 2019-04-21: qty 2

## 2019-04-21 MED ORDER — PALONOSETRON HCL INJECTION 0.25 MG/5ML
INTRAVENOUS | Status: AC
  Filled 2019-04-21: qty 5

## 2019-04-21 MED ORDER — SODIUM CHLORIDE 0.9 % IV SOLN
150.0000 mg | Freq: Once | INTRAVENOUS | Status: AC
  Administered 2019-04-21: 150 mg via INTRAVENOUS
  Filled 2019-04-21: qty 150

## 2019-04-21 MED ORDER — DIPHENHYDRAMINE HCL 25 MG PO CAPS
ORAL_CAPSULE | ORAL | Status: AC
  Filled 2019-04-21: qty 2

## 2019-04-21 MED ORDER — ACETAMINOPHEN 325 MG PO TABS
650.0000 mg | ORAL_TABLET | Freq: Once | ORAL | Status: AC
  Administered 2019-04-21: 650 mg via ORAL

## 2019-04-21 MED ORDER — DOXORUBICIN HCL CHEMO IV INJECTION 2 MG/ML
50.0000 mg/m2 | Freq: Once | INTRAVENOUS | Status: AC
  Administered 2019-04-21: 100 mg via INTRAVENOUS
  Filled 2019-04-21: qty 50

## 2019-04-21 MED ORDER — SODIUM CHLORIDE 0.9 % IV SOLN
Freq: Once | INTRAVENOUS | Status: AC
  Filled 2019-04-21: qty 250

## 2019-04-21 MED ORDER — VINCRISTINE SULFATE CHEMO INJECTION 1 MG/ML
2.0000 mg | Freq: Once | INTRAVENOUS | Status: AC
  Administered 2019-04-21: 2 mg via INTRAVENOUS
  Filled 2019-04-21: qty 2

## 2019-04-21 MED ORDER — SODIUM CHLORIDE 0.9 % IV SOLN
375.0000 mg/m2 | Freq: Once | INTRAVENOUS | Status: AC
  Administered 2019-04-21: 700 mg via INTRAVENOUS
  Filled 2019-04-21: qty 20

## 2019-04-21 NOTE — Patient Instructions (Signed)
[facility] Discharge Instructions for Patients Receiving Chemotherapy  Today you received the following chemotherapy agents: rituximab, doxorubicin, vincristine, and cyclophosphamide.  To help prevent nausea and vomiting after your treatment, we encourage you to take your nausea medication as directed.   If you develop nausea and vomiting that is not controlled by your nausea medication, call the clinic.   BELOW ARE SYMPTOMS THAT SHOULD BE REPORTED IMMEDIATELY:  *FEVER GREATER THAN 100.5 F  *CHILLS WITH OR WITHOUT FEVER  NAUSEA AND VOMITING THAT IS NOT CONTROLLED WITH YOUR NAUSEA MEDICATION  *UNUSUAL SHORTNESS OF BREATH  *UNUSUAL BRUISING OR BLEEDING  TENDERNESS IN MOUTH AND THROAT WITH OR WITHOUT PRESENCE OF ULCERS  *URINARY PROBLEMS  *BOWEL PROBLEMS  UNUSUAL RASH Items with * indicate a potential emergency and should be followed up as soon as possible.  Feel free to call the clinic should you have any questions or concerns. The clinic phone number is [phone].  Please show the CHEMO ALERT CARD at check-in to the Emergency Department and triage nurse.   

## 2019-04-21 NOTE — Progress Notes (Signed)
Per Dr. Julien Nordmann okay for patient to receive treatment today with heart rate elevated at 105.

## 2019-04-21 NOTE — Progress Notes (Signed)
[place] [phone]   [phone]  OFFICE PROGRESS NOTE  [name], MD [address] [place] [address]  DIAGNOSIS: Stage IV non-Hodgkin's lymphoma, mantle cell lymphoma. She presented with left iliac and retroperitoneal lymphadenopathy. She also presented with left axillary lymphadenopathy and bone marrow involvement. She was diagnosed in December 2020.  PRIOR THERAPY: None  CURRENT THERAPY: Systemic chemotherapy with R-CHOPwithNeulasta support. First dose on 01/27/2019. Status post 4 cycles.   INTERVAL HISTORY: Natalie Baker 64 y.o. female returns to the clinic today for follow-up visit.  The patient is feeling fine today with no concerning complaints except for occasional nausea and she has to take her antiemetics on daily basis.  She denied having any chest pain, shortness of breath, cough or hemoptysis.  She denied having any fever or chills.  She has no current vomiting, diarrhea or constipation.  She has no headache or visual changes.  She is here today for evaluation before starting cycle #5 of her treatment.  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  MEDICATIONS:  Current Outpatient Medications  Medication Sig Dispense Refill   allopurinol (ZYLOPRIM) 100 MG tablet TAKE 1 TABLET BY MOUTH TWICE A DAY 180 tablet 1   ARMOUR THYROID 90 MG tablet Take 90 mg by mouth every morning.     estradiol (VIVELLE-DOT) 0.05 MG/24HR patch Place 1 patch onto the skin 2 (two) times a week.     lidocaine-prilocaine (EMLA) cream Apply 1 application topically as  needed. 30 g 0   ondansetron (ZOFRAN) 8 MG tablet Take 1 tablet (8 mg total) by mouth every 8 (eight) hours as needed for nausea or vomiting. (Patient not taking: Reported on 02/18/2019) 30 tablet 1   predniSONE (DELTASONE) 50 MG tablet Please take 2 tablets for 5 consecutive days starting on the first day of chemotherapy every cycle. 40 tablet 2   prochlorperazine (COMPAZINE) 10 MG tablet TAKE 1 TABLET (10 MG TOTAL) BY MOUTH EVERY 6 (SIX) HOURS AS NEEDED FOR NAUSEA OR VOMITING. 30 tablet 3   progesterone (PROMETRIUM) 100 MG capsule      rosuvastatin (CRESTOR) 5 MG tablet Take 1 tablet   3 x /week  (MWF)  for Cholesterol 36 tablet 1   No current facility-administered medications for this visit.    SURGICAL HISTORY:  Past Surgical History:  Procedure Laterality Date   ABDOMINAL HYSTERECTOMY  1997   CESAREAN SECTION  1983   COLONOSCOPY W/ BIOPSIES  12/29/2006   IR IMAGING GUIDED PORT INSERTION  01/15/2019   LAPAROSCOPY     1991 for emdometriosis   UPPER GASTROINTESTINAL ENDOSCOPY      REVIEW OF SYSTEMS:  A comprehensive review of systems was negative except for: Gastrointestinal: positive for nausea   PHYSICAL EXAMINATION: General appearance: alert, cooperative and no distress Head: Normocephalic, without obvious abnormality, atraumatic Neck: no adenopathy, no JVD, supple, symmetrical, trachea midline and thyroid not enlarged, symmetric, no tenderness/mass/nodules Lymph nodes: Cervical, supraclavicular, and axillary nodes normal. Resp: clear to auscultation bilaterally Back: symmetric,  no curvature. ROM normal. No CVA tenderness. Cardio: regular rate and rhythm, S1, S2 normal, no murmur, click, rub or gallop GI: soft, non-tender; bowel sounds normal; no masses,  no organomegaly Extremities: extremities normal, atraumatic, no cyanosis or edema  ECOG PERFORMANCE STATUS: 1 - Symptomatic but completely ambulatory  Blood pressure (!) 118/53, pulse (!) 105, temperature 98.3 F  (36.8 C), temperature source Oral, resp. rate 20, height 5\' 2"  (1.575 m), weight 202 lb 14.4 oz (92 kg), SpO2 96 %.  LABORATORY DATA: Lab Results  Component Value Date   WBC 9.5 04/21/2019   HGB 11.7 (L) 04/21/2019   HCT 36.8 04/21/2019   MCV 86.4 04/21/2019   PLT 296 04/21/2019      Chemistry      Component Value Date/Time   NA 139 04/15/2019 1430   K 4.4 04/15/2019 1430   CL 105 04/15/2019 1430   CO2 28 04/15/2019 1430   BUN 11 04/15/2019 1430   CREATININE 0.91 04/15/2019 1430   CREATININE 0.74 12/21/2018 1513      Component Value Date/Time   CALCIUM 9.4 04/15/2019 1430   ALKPHOS 114 04/15/2019 1430   AST 17 04/15/2019 1430   ALT 18 04/15/2019 1430   BILITOT <0.2 (L) 04/15/2019 1430       RADIOGRAPHIC STUDIES: CT Chest W Contrast  Result Date: 03/29/2019 CLINICAL DATA:  Restaging mantle cell lymphoma EXAM: CT CHEST, ABDOMEN, AND PELVIS WITH CONTRAST TECHNIQUE: Multidetector CT imaging of the chest, abdomen and pelvis was performed following the standard protocol during bolus administration of intravenous contrast. CONTRAST:  126mL OMNIPAQUE IOHEXOL 300 MG/ML  SOLN COMPARISON:  CT chest, abdomen and pelvis from 12/22/2018 FINDINGS: CT CHEST FINDINGS Cardiovascular: Heart size appears within normal limits. Aortic atherosclerosis. No pericardial effusion identified Mediastinum/Nodes: Normal appearance of the thyroid gland. The trachea appears patent and is midline. Normal appearance of the esophagus. -index left supraclavicular lymph node measures 1.5 cm, image 11/2. Previously this measured the same. -index left axillary node measures the 1.1 cm, image 15/2. Previously this measured the same. -index left axillary lymph node measures 0.9 cm, image 19/2. Previously 1 cm. -index subcarinal node measures 0.7 cm, image 26/2. Previously 1 cm. -index right axillary node measures 0.6 cm, image 16/2. Previously 1 cm. Lungs/Pleura: No pleural effusion identified. Subsegmental atelectasis  within the anterior basal right middle lobe is noted, similar to previous exam. -centrally located right middle lobe lung nodule measures 0.9 cm, image 85/6. Unchanged. Musculoskeletal: No chest wall mass or suspicious bone lesions identified. CT ABDOMEN PELVIS FINDINGS Hepatobiliary: No focal liver abnormality is seen. No gallstones, gallbladder wall thickening, or biliary dilatation. Pancreas: Unremarkable. No pancreatic ductal dilatation or surrounding inflammatory changes. Spleen: Normal in size without focal abnormality. Adrenals/Urinary Tract: Normal appearance of the adrenal glands. The kidneys are unremarkable. No mass or hydronephrosis identified. Stomach/Bowel: Stomach is within normal limits. Appendix appears normal. No evidence of bowel wall thickening, distention, or inflammatory changes. Vascular/Lymphatic: Aortic atherosclerosis. No aneurysm. -Index left periaortic node measures 1.8 cm, image 69/2. Previously 1.9 cm. -index pre caval lymph node measures 0.8 cm, image 79/2. Previously 0.9 cm. Index left common iliac lymph node measures 1.5 cm, image 85/2. Previously 1.6 cm. -index left external iliac node measures 2.1 cm, image 99/2. Previously 1.5 cm. -Index right pelvic sidewall node measures 0.9 cm, image 101/2. Previously 0.8 cm. Reproductive: Status post hysterectomy. No adnexal masses. Other: No ascites or focal fluid collections. Musculoskeletal: No acute or significant osseous findings. IMPRESSION: 1. Overall there has  been no significant interval change in adenopathy within the chest, abdomen or pelvis. 2. Stable appearance of right middle lobe lung nodule measuring 0.9 cm. Aortic Atherosclerosis (ICD10-I70.0). Electronically Signed   By: Kerby Moors M.D.   On: 03/29/2019 13:14   CT Abdomen Pelvis W Contrast  Result Date: 03/29/2019 CLINICAL DATA:  Restaging mantle cell lymphoma EXAM: CT CHEST, ABDOMEN, AND PELVIS WITH CONTRAST TECHNIQUE: Multidetector CT imaging of the chest, abdomen  and pelvis was performed following the standard protocol during bolus administration of intravenous contrast. CONTRAST:  135mL OMNIPAQUE IOHEXOL 300 MG/ML  SOLN COMPARISON:  CT chest, abdomen and pelvis from 12/22/2018 FINDINGS: CT CHEST FINDINGS Cardiovascular: Heart size appears within normal limits. Aortic atherosclerosis. No pericardial effusion identified Mediastinum/Nodes: Normal appearance of the thyroid gland. The trachea appears patent and is midline. Normal appearance of the esophagus. -index left supraclavicular lymph node measures 1.5 cm, image 11/2. Previously this measured the same. -index left axillary node measures the 1.1 cm, image 15/2. Previously this measured the same. -index left axillary lymph node measures 0.9 cm, image 19/2. Previously 1 cm. -index subcarinal node measures 0.7 cm, image 26/2. Previously 1 cm. -index right axillary node measures 0.6 cm, image 16/2. Previously 1 cm. Lungs/Pleura: No pleural effusion identified. Subsegmental atelectasis within the anterior basal right middle lobe is noted, similar to previous exam. -centrally located right middle lobe lung nodule measures 0.9 cm, image 85/6. Unchanged. Musculoskeletal: No chest wall mass or suspicious bone lesions identified. CT ABDOMEN PELVIS FINDINGS Hepatobiliary: No focal liver abnormality is seen. No gallstones, gallbladder wall thickening, or biliary dilatation. Pancreas: Unremarkable. No pancreatic ductal dilatation or surrounding inflammatory changes. Spleen: Normal in size without focal abnormality. Adrenals/Urinary Tract: Normal appearance of the adrenal glands. The kidneys are unremarkable. No mass or hydronephrosis identified. Stomach/Bowel: Stomach is within normal limits. Appendix appears normal. No evidence of bowel wall thickening, distention, or inflammatory changes. Vascular/Lymphatic: Aortic atherosclerosis. No aneurysm. -Index left periaortic node measures 1.8 cm, image 69/2. Previously 1.9 cm. -index pre  caval lymph node measures 0.8 cm, image 79/2. Previously 0.9 cm. Index left common iliac lymph node measures 1.5 cm, image 85/2. Previously 1.6 cm. -index left external iliac node measures 2.1 cm, image 99/2. Previously 1.5 cm. -Index right pelvic sidewall node measures 0.9 cm, image 101/2. Previously 0.8 cm. Reproductive: Status post hysterectomy. No adnexal masses. Other: No ascites or focal fluid collections. Musculoskeletal: No acute or significant osseous findings. IMPRESSION: 1. Overall there has been no significant interval change in adenopathy within the chest, abdomen or pelvis. 2. Stable appearance of right middle lobe lung nodule measuring 0.9 cm. Aortic Atherosclerosis (ICD10-I70.0). Electronically Signed   By: Kerby Moors M.D.   On: 03/29/2019 13:14    ASSESSMENT AND PLAN: This is a very pleasant 64 years old white female recently diagnosed with stage IV less aggressive mantle cell lymphoma presented with left iliac and retroperitoneal lymphadenopathy as well as left axillary and bone marrow involvement in December 2020. The patient is currently undergoing systemic chemotherapy with R CHOP status post 4 cycles. The patient continues to tolerate this treatment well with no concerning adverse effects except for the nausea. I recommended for her to proceed with cycle #5 today as planned. I will see her back for follow-up visit in 3 weeks for evaluation before the next cycle of her treatment. She was advised to call immediately if she has any concerning symptoms in the interval. The patient voices understanding of current disease status and treatment options and  is in agreement with the current care plan.  All questions were answered. The patient knows to call the clinic with any problems, questions or concerns. We can certainly see the patient much sooner if necessary.  Disclaimer: This note was dictated with voice recognition software. Similar sounding words can inadvertently be  transcribed and may not be corrected upon review.

## 2019-04-23 ENCOUNTER — Inpatient Hospital Stay: Payer: BC Managed Care – PPO

## 2019-04-23 ENCOUNTER — Other Ambulatory Visit: Payer: Self-pay

## 2019-04-23 VITALS — BP 108/62 | HR 88 | Temp 98.2°F | Resp 18

## 2019-04-23 DIAGNOSIS — Z5189 Encounter for other specified aftercare: Secondary | ICD-10-CM | POA: Diagnosis not present

## 2019-04-23 DIAGNOSIS — C8316 Mantle cell lymphoma, intrapelvic lymph nodes: Secondary | ICD-10-CM

## 2019-04-23 DIAGNOSIS — Z5111 Encounter for antineoplastic chemotherapy: Secondary | ICD-10-CM | POA: Diagnosis not present

## 2019-04-23 DIAGNOSIS — C8318 Mantle cell lymphoma, lymph nodes of multiple sites: Secondary | ICD-10-CM | POA: Diagnosis not present

## 2019-04-23 DIAGNOSIS — E785 Hyperlipidemia, unspecified: Secondary | ICD-10-CM | POA: Diagnosis not present

## 2019-04-23 DIAGNOSIS — E039 Hypothyroidism, unspecified: Secondary | ICD-10-CM | POA: Diagnosis not present

## 2019-04-23 DIAGNOSIS — Z452 Encounter for adjustment and management of vascular access device: Secondary | ICD-10-CM | POA: Diagnosis not present

## 2019-04-23 DIAGNOSIS — Z5112 Encounter for antineoplastic immunotherapy: Secondary | ICD-10-CM | POA: Diagnosis not present

## 2019-04-23 DIAGNOSIS — E669 Obesity, unspecified: Secondary | ICD-10-CM | POA: Diagnosis not present

## 2019-04-23 MED ORDER — PEGFILGRASTIM-JMDB 6 MG/0.6ML ~~LOC~~ SOSY
PREFILLED_SYRINGE | SUBCUTANEOUS | Status: AC
  Filled 2019-04-23: qty 0.6

## 2019-04-23 MED ORDER — PEGFILGRASTIM-JMDB 6 MG/0.6ML ~~LOC~~ SOSY
6.0000 mg | PREFILLED_SYRINGE | Freq: Once | SUBCUTANEOUS | Status: AC
  Administered 2019-04-23: 6 mg via SUBCUTANEOUS

## 2019-04-23 NOTE — Patient Instructions (Signed)

## 2019-04-29 ENCOUNTER — Inpatient Hospital Stay: Payer: BC Managed Care – PPO

## 2019-04-29 ENCOUNTER — Other Ambulatory Visit: Payer: Self-pay

## 2019-04-29 DIAGNOSIS — C8318 Mantle cell lymphoma, lymph nodes of multiple sites: Secondary | ICD-10-CM | POA: Diagnosis not present

## 2019-04-29 DIAGNOSIS — Z95828 Presence of other vascular implants and grafts: Secondary | ICD-10-CM

## 2019-04-29 DIAGNOSIS — E039 Hypothyroidism, unspecified: Secondary | ICD-10-CM | POA: Diagnosis not present

## 2019-04-29 DIAGNOSIS — Z5189 Encounter for other specified aftercare: Secondary | ICD-10-CM | POA: Diagnosis not present

## 2019-04-29 DIAGNOSIS — E785 Hyperlipidemia, unspecified: Secondary | ICD-10-CM | POA: Diagnosis not present

## 2019-04-29 DIAGNOSIS — E669 Obesity, unspecified: Secondary | ICD-10-CM | POA: Diagnosis not present

## 2019-04-29 DIAGNOSIS — Z5112 Encounter for antineoplastic immunotherapy: Secondary | ICD-10-CM | POA: Diagnosis not present

## 2019-04-29 DIAGNOSIS — Z452 Encounter for adjustment and management of vascular access device: Secondary | ICD-10-CM | POA: Diagnosis not present

## 2019-04-29 DIAGNOSIS — C8310 Mantle cell lymphoma, unspecified site: Secondary | ICD-10-CM

## 2019-04-29 DIAGNOSIS — Z5111 Encounter for antineoplastic chemotherapy: Secondary | ICD-10-CM | POA: Diagnosis not present

## 2019-04-29 LAB — CBC WITH DIFFERENTIAL (CANCER CENTER ONLY)
Abs Immature Granulocytes: 0.38 10*3/uL — ABNORMAL HIGH (ref 0.00–0.07)
Basophils Absolute: 0.1 10*3/uL (ref 0.0–0.1)
Basophils Relative: 1 %
Eosinophils Absolute: 0.2 10*3/uL (ref 0.0–0.5)
Eosinophils Relative: 2 %
HCT: 36.4 % (ref 36.0–46.0)
Hemoglobin: 11.7 g/dL — ABNORMAL LOW (ref 12.0–15.0)
Immature Granulocytes: 6 %
Lymphocytes Relative: 17 %
Lymphs Abs: 1.1 10*3/uL (ref 0.7–4.0)
MCH: 28.1 pg (ref 26.0–34.0)
MCHC: 32.1 g/dL (ref 30.0–36.0)
MCV: 87.5 fL (ref 80.0–100.0)
Monocytes Absolute: 1 10*3/uL (ref 0.1–1.0)
Monocytes Relative: 15 %
Neutro Abs: 4.1 10*3/uL (ref 1.7–7.7)
Neutrophils Relative %: 59 %
Platelet Count: 220 10*3/uL (ref 150–400)
RBC: 4.16 MIL/uL (ref 3.87–5.11)
RDW: 17.8 % — ABNORMAL HIGH (ref 11.5–15.5)
WBC Count: 6.8 10*3/uL (ref 4.0–10.5)
nRBC: 0.4 % — ABNORMAL HIGH (ref 0.0–0.2)

## 2019-04-29 LAB — CMP (CANCER CENTER ONLY)
ALT: 18 U/L (ref 0–44)
AST: 14 U/L — ABNORMAL LOW (ref 15–41)
Albumin: 3.9 g/dL (ref 3.5–5.0)
Alkaline Phosphatase: 116 U/L (ref 38–126)
Anion gap: 12 (ref 5–15)
BUN: 12 mg/dL (ref 8–23)
CO2: 27 mmol/L (ref 22–32)
Calcium: 9.7 mg/dL (ref 8.9–10.3)
Chloride: 101 mmol/L (ref 98–111)
Creatinine: 0.81 mg/dL (ref 0.44–1.00)
GFR, Est AFR Am: >60 mL/min (ref >60)
GFR, Estimated: >60 mL/min (ref >60)
Glucose, Bld: 90 mg/dL (ref 70–99)
Potassium: 4.3 mmol/L (ref 3.5–5.1)
Sodium: 140 mmol/L (ref 135–145)
Total Bilirubin: 0.2 mg/dL — ABNORMAL LOW (ref 0.3–1.2)
Total Protein: 6.8 g/dL (ref 6.5–8.1)

## 2019-04-29 LAB — LACTATE DEHYDROGENASE: LDH: 271 U/L — ABNORMAL HIGH (ref 98–192)

## 2019-04-29 MED ORDER — SODIUM CHLORIDE 0.9% FLUSH
10.0000 mL | INTRAVENOUS | Status: DC | PRN
  Administered 2019-04-29: 10 mL
  Filled 2019-04-29: qty 10

## 2019-04-29 MED ORDER — HEPARIN SOD (PORK) LOCK FLUSH 100 UNIT/ML IV SOLN
500.0000 [IU] | Freq: Once | INTRAVENOUS | Status: AC | PRN
  Administered 2019-04-29: 500 [IU]
  Filled 2019-04-29: qty 5

## 2019-05-06 ENCOUNTER — Inpatient Hospital Stay: Payer: BC Managed Care – PPO

## 2019-05-06 ENCOUNTER — Other Ambulatory Visit: Payer: Self-pay

## 2019-05-06 DIAGNOSIS — Z5111 Encounter for antineoplastic chemotherapy: Secondary | ICD-10-CM | POA: Diagnosis not present

## 2019-05-06 DIAGNOSIS — E039 Hypothyroidism, unspecified: Secondary | ICD-10-CM | POA: Diagnosis not present

## 2019-05-06 DIAGNOSIS — C8310 Mantle cell lymphoma, unspecified site: Secondary | ICD-10-CM

## 2019-05-06 DIAGNOSIS — Z452 Encounter for adjustment and management of vascular access device: Secondary | ICD-10-CM | POA: Diagnosis not present

## 2019-05-06 DIAGNOSIS — C8318 Mantle cell lymphoma, lymph nodes of multiple sites: Secondary | ICD-10-CM | POA: Diagnosis not present

## 2019-05-06 DIAGNOSIS — Z5112 Encounter for antineoplastic immunotherapy: Secondary | ICD-10-CM | POA: Diagnosis not present

## 2019-05-06 DIAGNOSIS — E785 Hyperlipidemia, unspecified: Secondary | ICD-10-CM | POA: Diagnosis not present

## 2019-05-06 DIAGNOSIS — Z95828 Presence of other vascular implants and grafts: Secondary | ICD-10-CM

## 2019-05-06 DIAGNOSIS — E669 Obesity, unspecified: Secondary | ICD-10-CM | POA: Diagnosis not present

## 2019-05-06 DIAGNOSIS — Z5189 Encounter for other specified aftercare: Secondary | ICD-10-CM | POA: Diagnosis not present

## 2019-05-06 LAB — CBC WITH DIFFERENTIAL (CANCER CENTER ONLY)
Abs Immature Granulocytes: 0.75 10*3/uL — ABNORMAL HIGH (ref 0.00–0.07)
Basophils Absolute: 0.2 10*3/uL — ABNORMAL HIGH (ref 0.0–0.1)
Basophils Relative: 2 %
Eosinophils Absolute: 0.1 10*3/uL (ref 0.0–0.5)
Eosinophils Relative: 1 %
HCT: 36 % (ref 36.0–46.0)
Hemoglobin: 11.4 g/dL — ABNORMAL LOW (ref 12.0–15.0)
Immature Granulocytes: 9 %
Lymphocytes Relative: 11 %
Lymphs Abs: 1 10*3/uL (ref 0.7–4.0)
MCH: 27.9 pg (ref 26.0–34.0)
MCHC: 31.7 g/dL (ref 30.0–36.0)
MCV: 88 fL (ref 80.0–100.0)
Monocytes Absolute: 0.8 10*3/uL (ref 0.1–1.0)
Monocytes Relative: 9 %
Neutro Abs: 6 10*3/uL (ref 1.7–7.7)
Neutrophils Relative %: 68 %
Platelet Count: 221 10*3/uL (ref 150–400)
RBC: 4.09 MIL/uL (ref 3.87–5.11)
RDW: 18.5 % — ABNORMAL HIGH (ref 11.5–15.5)
WBC Count: 8.8 10*3/uL (ref 4.0–10.5)
nRBC: 0.3 % — ABNORMAL HIGH (ref 0.0–0.2)

## 2019-05-06 LAB — CMP (CANCER CENTER ONLY)
ALT: 17 U/L (ref 0–44)
AST: 20 U/L (ref 15–41)
Albumin: 3.9 g/dL (ref 3.5–5.0)
Alkaline Phosphatase: 96 U/L (ref 38–126)
Anion gap: 10 (ref 5–15)
BUN: 14 mg/dL (ref 8–23)
CO2: 26 mmol/L (ref 22–32)
Calcium: 9.6 mg/dL (ref 8.9–10.3)
Chloride: 103 mmol/L (ref 98–111)
Creatinine: 0.82 mg/dL (ref 0.44–1.00)
GFR, Est AFR Am: >60 mL/min (ref >60)
GFR, Estimated: >60 mL/min (ref >60)
Glucose, Bld: 85 mg/dL (ref 70–99)
Potassium: 4 mmol/L (ref 3.5–5.1)
Sodium: 139 mmol/L (ref 135–145)
Total Bilirubin: 0.2 mg/dL — ABNORMAL LOW (ref 0.3–1.2)
Total Protein: 6.8 g/dL (ref 6.5–8.1)

## 2019-05-06 LAB — LACTATE DEHYDROGENASE: LDH: 279 U/L — ABNORMAL HIGH (ref 98–192)

## 2019-05-06 MED ORDER — HEPARIN SOD (PORK) LOCK FLUSH 100 UNIT/ML IV SOLN
500.0000 [IU] | Freq: Once | INTRAVENOUS | Status: AC | PRN
  Administered 2019-05-06: 500 [IU]
  Filled 2019-05-06: qty 5

## 2019-05-06 MED ORDER — SODIUM CHLORIDE 0.9% FLUSH
10.0000 mL | INTRAVENOUS | Status: DC | PRN
  Administered 2019-05-06: 10 mL
  Filled 2019-05-06: qty 10

## 2019-05-12 NOTE — Progress Notes (Signed)
[place] OFFICE PROGRESS NOTE  [name], MD [address] [place] [address]  DIAGNOSIS: Stage IV non-Hodgkin's lymphoma, mantle cell lymphoma. She presented with left iliac and retroperitoneal lymphadenopathy. She also presented with left axillary lymphadenopathy and bone marrow involvement. She was diagnosed in December 2020.  PRIOR THERAPY: None  CURRENT THERAPY: Systemic chemotherapy with R-CHOPwithNeulasta support.First dose on1/20/2021. Status post5cycles.  INTERVAL HISTORY: Natalie Baker 64 y.o. female returns to the clinic for a follow up visit. The patient is feeling well today without any concerning complaints. The patient continues to tolerate treatment with R-CHOP well without any adverse side effects except for nausea. Her nausea is better controlled compared to prior because she takes her anti-emetic twice daily. She also reports feeling a sensation of slow transit with her swallowing for one day before it resolves without intervention. Denies any fever, chills, night sweats, palpable lymphadenopathy, or weight loss. Denies any chest pain, shortness of breath, cough, or hemoptysis. Denies any nausea, vomiting, diarrhea, or constipation. She mentions that for the last year and a half, she experiences RUQ abdominal pain approximately x1 per month. She states the pain typically occurs at night and lasts several hours before subsiding. Tylenol helps alleviate her pain. She is not having pain at this time. The last time she had this pain was 1 week ago. She has a history of an ulcer. No gallstones seen on last scan. Denies any headache or visual changes. Denies any rashes or skin changes. Denies peripheral neuropathy. The patient is here today for evaluation prior to starting cycle #6   MEDICAL HISTORY: Past Medical History:  Diagnosis Date  . Allergy   . Cancer [facility])    being worked up for [place]  . Colon polyp 12/29/2006  .  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 Hyperlipidemia   . Hypothyroidism   . Low kidney function   . Obesity    BMI 34  . Osteoporosis   . Palpitations   . Vitamin D deficiency     ALLERGIES:  is allergic to ppd [tuberculin purified protein derivative].  MEDICATIONS:  Current Outpatient Medications  Medication Sig Dispense Refill  . allopurinol (ZYLOPRIM) 100 MG tablet TAKE 1 TABLET BY MOUTH TWICE A DAY 180 tablet 1  . ARMOUR THYROID 90 MG tablet Take 90 mg by mouth every morning.    Marland Kitchen estradiol (VIVELLE-DOT) 0.075 MG/24HR APPLY NEW PATCH TWICE WEEKLY    . lidocaine-prilocaine (EMLA) cream Apply 1 application topically as needed. 30 g 0  . ondansetron (ZOFRAN) 8 MG tablet Take 1 tablet (8 mg total) by mouth every 8 (eight) hours as needed for nausea or vomiting. 30 tablet 1  . predniSONE (DELTASONE) 50 MG tablet Please take 2 tablets for 5 consecutive days starting on the first day of chemotherapy every cycle. 40 tablet 2  . prochlorperazine (COMPAZINE) 10 MG tablet TAKE 1 TABLET (10 MG TOTAL) BY MOUTH EVERY 6 (SIX) HOURS AS NEEDED FOR NAUSEA OR VOMITING. 30 tablet 3  . progesterone (PROMETRIUM) 100 MG capsule     . rosuvastatin (CRESTOR) 5 MG tablet Take 1 tablet   3 x /week  (MWF)  for Cholesterol (Patient not taking: Reported on 05/13/2019) 36 tablet 1   No current facility-administered medications for this visit.    SURGICAL HISTORY:  Past Surgical History:  Procedure Laterality Date  . ABDOMINAL HYSTERECTOMY  1997  . CESAREAN SECTION  1983  . COLONOSCOPY W/ BIOPSIES  12/29/2006  . IR IMAGING GUIDED  PORT INSERTION  01/15/2019  . LAPAROSCOPY     1991 for emdometriosis  . UPPER GASTROINTESTINAL ENDOSCOPY      REVIEW OF SYSTEMS:   Review of Systems  Constitutional: Negative for appetite change, chills, fatigue, fever and unexpected weight change.  HENT: Negative for mouth sores, nosebleeds, sore throat and trouble swallowing.   Eyes: Negative for eye problems and icterus.   Respiratory: Negative for cough, hemoptysis, shortness of breath and wheezing.   Cardiovascular: Negative for chest pain and leg swelling.  Gastrointestinal: Positive for nausea (improved control). Positive for RUQ abdominal pain once a month for 1.5 years. Negative for abdominal pain at this time, constipation, diarrhea, nausea and vomiting.  Genitourinary: Negative for bladder incontinence, difficulty urinating, dysuria, frequency and hematuria.   Musculoskeletal: Negative for back pain, gait problem, neck pain and neck stiffness.  Skin: Negative for itching and rash.  Neurological: Negative for dizziness, extremity weakness, gait problem, headaches, light-headedness and seizures.  Hematological: Negative for adenopathy. Does not bruise/bleed easily.  Psychiatric/Behavioral: Negative for confusion, depression and sleep disturbance. The patient is not nervous/anxious.     PHYSICAL EXAMINATION:  Blood pressure (!) 124/39, pulse 100, temperature 97.8 F (36.6 C), temperature source Temporal, resp. rate 19, height 5\' 2"  (1.575 m), weight 204 lb 9.6 oz (92.8 kg), SpO2 95 %.  ECOG PERFORMANCE STATUS: 1 - Symptomatic but completely ambulatory  Physical Exam  Constitutional: Oriented to person, place, and time and well-developed, well-nourished, and in no distress.  HENT:  Head: Normocephalic and atraumatic.  Mouth/Throat: Oropharynx is clear and moist. No oropharyngeal exudate.  Eyes: Conjunctivae are normal. Right eye exhibits no discharge. Left eye exhibits no discharge. No scleral icterus.  Neck: Normal range of motion. Neck supple.  Cardiovascular: Normal rate, regular rhythm, normal heart sounds and intact distal pulses.   Pulmonary/Chest: Effort normal and breath sounds normal. No respiratory distress. No wheezes. No rales.  Abdominal: Soft. Bowel sounds are normal. Exhibits no distension and no mass. There is no tenderness. Negative murphy's sign. Negative Rovsing sign. No rebound  tenderness.  Musculoskeletal: Normal range of motion. Exhibits no edema.  Lymphadenopathy:    No cervical adenopathy.  Neurological: Alert and oriented to person, place, and time. Exhibits normal muscle tone. Gait normal. Coordination normal.  Skin: Skin is warm and dry. No rash noted. Not diaphoretic. No erythema. No pallor.  Psychiatric: Mood, memory and judgment normal.  Vitals reviewed.  LABORATORY DATA: Lab Results  Component Value Date   WBC 8.0 05/13/2019   HGB 11.5 (L) 05/13/2019   HCT 36.0 05/13/2019   MCV 88.9 05/13/2019   PLT 316 05/13/2019      Chemistry      Component Value Date/Time   NA 139 05/06/2019 1411   K 4.0 05/06/2019 1411   CL 103 05/06/2019 1411   CO2 26 05/06/2019 1411   BUN 14 05/06/2019 1411   CREATININE 0.82 05/06/2019 1411   CREATININE 0.74 12/21/2018 1513      Component Value Date/Time   CALCIUM 9.6 05/06/2019 1411   ALKPHOS 96 05/06/2019 1411   AST 20 05/06/2019 1411   ALT 17 05/06/2019 1411   BILITOT 0.2 (L) 05/06/2019 1411       RADIOGRAPHIC STUDIES:  No results found.   ASSESSMENT/PLAN:  This is a very pleasant 43 year oldCaucasianfemale recently diagnosed with stage IVintermediate grade mantle cell lymphoma. She presented with left iliac, axillary, andretroperitoneal lymphadenopathy. She also presented with bone marrow involvement. She was diagnosed in December 2020.  She  is currently undergoing treatment with R-CHOP with Neulasta support. She is status post 5 cycles.   Labs were reviewed. Recommend that she proceed with her last cycle of treatment (cycle #6) of treatment today as scheduled.   I will arrange for a follow up PET scan in 4 weeks.   We will see her back for a follow up visit in 4 weeks for evaluation and to review the results of her PET scan.   Regarding her abdominal pain, the patient does not have any pain, fevers, nausea, vomiting, bowel changes, jaundice, itching, at this time. This has been occurring  for 1.5 years and no clear etiology seen on most recent CT scan. No gallstones noted on recent CT scan. Encouraged the patient to try taking a PPI to see if that improves her symptoms and encouraged her to follow up with her gastroenterologist. Of course, I discussed concerning symptoms that would warrant an emergency room evaluation such as severe and persistent abdominal pain, fevers, nausea/vomiting, chills, sweating, jaundice, etc.   The patient was advised to call immediately if she has any concerning symptoms in the interval. The patient voices understanding of current disease status and treatment options and is in agreement with the current care plan. All questions were answered. The patient knows to call the clinic with any problems, questions or concerns. We can certainly see the patient much sooner if necessary.      Orders Placed This Encounter  Procedures  . NM PET Image Restag (PS) Skull Base To Thigh    Standing Status:   Future    Standing Expiration Date:   05/12/2020    Order Specific Question:   ** REASON FOR EXAM (FREE TEXT)    Answer:   Restaging Mantle Cell Lymphoma    Order Specific Question:   If indicated for the ordered procedure, I authorize the administration of a radiopharmaceutical per Radiology protocol    Answer:   Yes    Order Specific Question:   Preferred imaging location?    Answer:   [facility]    Order Specific Question:   Radiology Contrast Protocol - do NOT remove file path    Answer:   [email or web address]     Ovidio Steele L Maika Mcelveen, PA-C 05/13/19

## 2019-05-13 ENCOUNTER — Inpatient Hospital Stay: Payer: BC Managed Care – PPO

## 2019-05-13 ENCOUNTER — Other Ambulatory Visit: Payer: Self-pay

## 2019-05-13 ENCOUNTER — Inpatient Hospital Stay: Payer: BC Managed Care – PPO | Attending: Physician Assistant | Admitting: Physician Assistant

## 2019-05-13 VITALS — BP 124/39 | HR 100 | Temp 97.8°F | Resp 19 | Ht 62.0 in | Wt 204.6 lb

## 2019-05-13 VITALS — BP 110/59 | HR 93 | Temp 97.7°F | Resp 16

## 2019-05-13 DIAGNOSIS — C8316 Mantle cell lymphoma, intrapelvic lymph nodes: Secondary | ICD-10-CM

## 2019-05-13 DIAGNOSIS — C8318 Mantle cell lymphoma, lymph nodes of multiple sites: Secondary | ICD-10-CM | POA: Insufficient documentation

## 2019-05-13 DIAGNOSIS — Z452 Encounter for adjustment and management of vascular access device: Secondary | ICD-10-CM | POA: Diagnosis not present

## 2019-05-13 DIAGNOSIS — Z5111 Encounter for antineoplastic chemotherapy: Secondary | ICD-10-CM | POA: Insufficient documentation

## 2019-05-13 DIAGNOSIS — Z5112 Encounter for antineoplastic immunotherapy: Secondary | ICD-10-CM | POA: Diagnosis not present

## 2019-05-13 DIAGNOSIS — C8313 Mantle cell lymphoma, intra-abdominal lymph nodes: Secondary | ICD-10-CM | POA: Diagnosis not present

## 2019-05-13 DIAGNOSIS — E669 Obesity, unspecified: Secondary | ICD-10-CM | POA: Diagnosis not present

## 2019-05-13 DIAGNOSIS — E039 Hypothyroidism, unspecified: Secondary | ICD-10-CM | POA: Insufficient documentation

## 2019-05-13 DIAGNOSIS — C8310 Mantle cell lymphoma, unspecified site: Secondary | ICD-10-CM

## 2019-05-13 DIAGNOSIS — Z5189 Encounter for other specified aftercare: Secondary | ICD-10-CM | POA: Insufficient documentation

## 2019-05-13 LAB — CMP (CANCER CENTER ONLY)
ALT: 21 U/L (ref 0–44)
AST: 20 U/L (ref 15–41)
Albumin: 3.8 g/dL (ref 3.5–5.0)
Alkaline Phosphatase: 73 U/L (ref 38–126)
Anion gap: 10 (ref 5–15)
BUN: 13 mg/dL (ref 8–23)
CO2: 25 mmol/L (ref 22–32)
Calcium: 9.8 mg/dL (ref 8.9–10.3)
Chloride: 106 mmol/L (ref 98–111)
Creatinine: 0.78 mg/dL (ref 0.44–1.00)
GFR, Est AFR Am: >60 mL/min (ref >60)
GFR, Estimated: >60 mL/min (ref >60)
Glucose, Bld: 92 mg/dL (ref 70–99)
Potassium: 4.3 mmol/L (ref 3.5–5.1)
Sodium: 141 mmol/L (ref 135–145)
Total Bilirubin: 0.3 mg/dL (ref 0.3–1.2)
Total Protein: 6.7 g/dL (ref 6.5–8.1)

## 2019-05-13 LAB — CBC WITH DIFFERENTIAL (CANCER CENTER ONLY)
Abs Immature Granulocytes: 0.06 10*3/uL (ref 0.00–0.07)
Basophils Absolute: 0.1 10*3/uL (ref 0.0–0.1)
Basophils Relative: 1 %
Eosinophils Absolute: 0 10*3/uL (ref 0.0–0.5)
Eosinophils Relative: 1 %
HCT: 36 % (ref 36.0–46.0)
Hemoglobin: 11.5 g/dL — ABNORMAL LOW (ref 12.0–15.0)
Immature Granulocytes: 1 %
Lymphocytes Relative: 9 %
Lymphs Abs: 0.7 10*3/uL (ref 0.7–4.0)
MCH: 28.4 pg (ref 26.0–34.0)
MCHC: 31.9 g/dL (ref 30.0–36.0)
MCV: 88.9 fL (ref 80.0–100.0)
Monocytes Absolute: 0.7 10*3/uL (ref 0.1–1.0)
Monocytes Relative: 9 %
Neutro Abs: 6.4 10*3/uL (ref 1.7–7.7)
Neutrophils Relative %: 79 %
Platelet Count: 316 10*3/uL (ref 150–400)
RBC: 4.05 MIL/uL (ref 3.87–5.11)
RDW: 17.8 % — ABNORMAL HIGH (ref 11.5–15.5)
WBC Count: 8 10*3/uL (ref 4.0–10.5)
nRBC: 0 % (ref 0.0–0.2)

## 2019-05-13 LAB — LACTATE DEHYDROGENASE: LDH: 196 U/L — ABNORMAL HIGH (ref 98–192)

## 2019-05-13 LAB — URIC ACID: Uric Acid, Serum: 3.4 mg/dL (ref 2.5–7.1)

## 2019-05-13 MED ORDER — ACETAMINOPHEN 325 MG PO TABS
650.0000 mg | ORAL_TABLET | Freq: Once | ORAL | Status: AC
  Administered 2019-05-13: 650 mg via ORAL

## 2019-05-13 MED ORDER — PALONOSETRON HCL INJECTION 0.25 MG/5ML
INTRAVENOUS | Status: AC
  Filled 2019-05-13: qty 5

## 2019-05-13 MED ORDER — DIPHENHYDRAMINE HCL 25 MG PO CAPS
ORAL_CAPSULE | ORAL | Status: AC
  Filled 2019-05-13: qty 2

## 2019-05-13 MED ORDER — HEPARIN SOD (PORK) LOCK FLUSH 100 UNIT/ML IV SOLN
500.0000 [IU] | Freq: Once | INTRAVENOUS | Status: AC | PRN
  Administered 2019-05-13: 500 [IU]
  Filled 2019-05-13: qty 5

## 2019-05-13 MED ORDER — DIPHENHYDRAMINE HCL 25 MG PO CAPS
50.0000 mg | ORAL_CAPSULE | Freq: Once | ORAL | Status: AC
  Administered 2019-05-13: 50 mg via ORAL

## 2019-05-13 MED ORDER — PALONOSETRON HCL INJECTION 0.25 MG/5ML
0.2500 mg | Freq: Once | INTRAVENOUS | Status: AC
  Administered 2019-05-13: 0.25 mg via INTRAVENOUS

## 2019-05-13 MED ORDER — VINCRISTINE SULFATE CHEMO INJECTION 1 MG/ML
2.0000 mg | Freq: Once | INTRAVENOUS | Status: AC
  Administered 2019-05-13: 2 mg via INTRAVENOUS
  Filled 2019-05-13: qty 2

## 2019-05-13 MED ORDER — SODIUM CHLORIDE 0.9 % IV SOLN
375.0000 mg/m2 | Freq: Once | INTRAVENOUS | Status: AC
  Administered 2019-05-13: 700 mg via INTRAVENOUS
  Filled 2019-05-13: qty 50

## 2019-05-13 MED ORDER — SODIUM CHLORIDE 0.9 % IV SOLN
10.0000 mg | Freq: Once | INTRAVENOUS | Status: AC
  Administered 2019-05-13: 10 mg via INTRAVENOUS
  Filled 2019-05-13: qty 10

## 2019-05-13 MED ORDER — SODIUM CHLORIDE 0.9 % IV SOLN
150.0000 mg | Freq: Once | INTRAVENOUS | Status: AC
  Administered 2019-05-13: 150 mg via INTRAVENOUS
  Filled 2019-05-13: qty 150

## 2019-05-13 MED ORDER — SODIUM CHLORIDE 0.9 % IV SOLN
Freq: Once | INTRAVENOUS | Status: AC
  Filled 2019-05-13: qty 250

## 2019-05-13 MED ORDER — DOXORUBICIN HCL CHEMO IV INJECTION 2 MG/ML
50.0000 mg/m2 | Freq: Once | INTRAVENOUS | Status: AC
  Administered 2019-05-13: 100 mg via INTRAVENOUS
  Filled 2019-05-13: qty 50

## 2019-05-13 MED ORDER — SODIUM CHLORIDE 0.9% FLUSH
10.0000 mL | INTRAVENOUS | Status: DC | PRN
  Administered 2019-05-13: 10 mL
  Filled 2019-05-13: qty 10

## 2019-05-13 MED ORDER — ACETAMINOPHEN 325 MG PO TABS
ORAL_TABLET | ORAL | Status: AC
  Filled 2019-05-13: qty 2

## 2019-05-13 MED ORDER — SODIUM CHLORIDE 0.9 % IV SOLN
750.0000 mg/m2 | Freq: Once | INTRAVENOUS | Status: AC
  Administered 2019-05-13: 1500 mg via INTRAVENOUS
  Filled 2019-05-13: qty 75

## 2019-05-13 NOTE — Patient Instructions (Signed)
[facility] Discharge Instructions for Patients Receiving Chemotherapy  Today you received the following chemotherapy agents: rituximab, doxorubicin, vincristine, and cyclophosphamide.  To help prevent nausea and vomiting after your treatment, we encourage you to take your nausea medication as directed.   If you develop nausea and vomiting that is not controlled by your nausea medication, call the clinic.   BELOW ARE SYMPTOMS THAT SHOULD BE REPORTED IMMEDIATELY:  *FEVER GREATER THAN 100.5 F  *CHILLS WITH OR WITHOUT FEVER  NAUSEA AND VOMITING THAT IS NOT CONTROLLED WITH YOUR NAUSEA MEDICATION  *UNUSUAL SHORTNESS OF BREATH  *UNUSUAL BRUISING OR BLEEDING  TENDERNESS IN MOUTH AND THROAT WITH OR WITHOUT PRESENCE OF ULCERS  *URINARY PROBLEMS  *BOWEL PROBLEMS  UNUSUAL RASH Items with * indicate a potential emergency and should be followed up as soon as possible.  Feel free to call the clinic should you have any questions or concerns. The clinic phone number is [phone].  Please show the CHEMO ALERT CARD at check-in to the Emergency Department and triage nurse.   

## 2019-05-15 ENCOUNTER — Inpatient Hospital Stay: Payer: BC Managed Care – PPO

## 2019-05-15 ENCOUNTER — Other Ambulatory Visit: Payer: Self-pay

## 2019-05-15 VITALS — BP 107/45 | HR 97 | Temp 98.5°F | Resp 18

## 2019-05-15 DIAGNOSIS — Z5112 Encounter for antineoplastic immunotherapy: Secondary | ICD-10-CM | POA: Diagnosis not present

## 2019-05-15 DIAGNOSIS — C8318 Mantle cell lymphoma, lymph nodes of multiple sites: Secondary | ICD-10-CM | POA: Diagnosis not present

## 2019-05-15 DIAGNOSIS — Z5111 Encounter for antineoplastic chemotherapy: Secondary | ICD-10-CM | POA: Diagnosis not present

## 2019-05-15 DIAGNOSIS — Z452 Encounter for adjustment and management of vascular access device: Secondary | ICD-10-CM | POA: Diagnosis not present

## 2019-05-15 DIAGNOSIS — E039 Hypothyroidism, unspecified: Secondary | ICD-10-CM | POA: Diagnosis not present

## 2019-05-15 DIAGNOSIS — E669 Obesity, unspecified: Secondary | ICD-10-CM | POA: Diagnosis not present

## 2019-05-15 DIAGNOSIS — Z5189 Encounter for other specified aftercare: Secondary | ICD-10-CM | POA: Diagnosis not present

## 2019-05-15 DIAGNOSIS — C8316 Mantle cell lymphoma, intrapelvic lymph nodes: Secondary | ICD-10-CM

## 2019-05-15 MED ORDER — PEGFILGRASTIM-JMDB 6 MG/0.6ML ~~LOC~~ SOSY
6.0000 mg | PREFILLED_SYRINGE | Freq: Once | SUBCUTANEOUS | Status: AC
  Administered 2019-05-15: 6 mg via SUBCUTANEOUS

## 2019-05-15 NOTE — Patient Instructions (Signed)

## 2019-05-19 ENCOUNTER — Inpatient Hospital Stay: Payer: BC Managed Care – PPO

## 2019-05-19 ENCOUNTER — Other Ambulatory Visit: Payer: Self-pay

## 2019-05-19 DIAGNOSIS — E669 Obesity, unspecified: Secondary | ICD-10-CM | POA: Diagnosis not present

## 2019-05-19 DIAGNOSIS — E039 Hypothyroidism, unspecified: Secondary | ICD-10-CM | POA: Diagnosis not present

## 2019-05-19 DIAGNOSIS — Z95828 Presence of other vascular implants and grafts: Secondary | ICD-10-CM

## 2019-05-19 DIAGNOSIS — C8318 Mantle cell lymphoma, lymph nodes of multiple sites: Secondary | ICD-10-CM | POA: Diagnosis not present

## 2019-05-19 DIAGNOSIS — Z5112 Encounter for antineoplastic immunotherapy: Secondary | ICD-10-CM | POA: Diagnosis not present

## 2019-05-19 DIAGNOSIS — Z452 Encounter for adjustment and management of vascular access device: Secondary | ICD-10-CM | POA: Diagnosis not present

## 2019-05-19 DIAGNOSIS — Z5189 Encounter for other specified aftercare: Secondary | ICD-10-CM | POA: Diagnosis not present

## 2019-05-19 DIAGNOSIS — C8310 Mantle cell lymphoma, unspecified site: Secondary | ICD-10-CM

## 2019-05-19 DIAGNOSIS — Z5111 Encounter for antineoplastic chemotherapy: Secondary | ICD-10-CM | POA: Diagnosis not present

## 2019-05-19 LAB — CMP (CANCER CENTER ONLY)
ALT: 26 U/L (ref 0–44)
AST: 13 U/L — ABNORMAL LOW (ref 15–41)
Albumin: 3.6 g/dL (ref 3.5–5.0)
Alkaline Phosphatase: 108 U/L (ref 38–126)
Anion gap: 9 (ref 5–15)
BUN: 14 mg/dL (ref 8–23)
CO2: 27 mmol/L (ref 22–32)
Calcium: 9.3 mg/dL (ref 8.9–10.3)
Chloride: 103 mmol/L (ref 98–111)
Creatinine: 0.83 mg/dL (ref 0.44–1.00)
GFR, Est AFR Am: >60 mL/min (ref >60)
GFR, Estimated: >60 mL/min (ref >60)
Glucose, Bld: 89 mg/dL (ref 70–99)
Potassium: 4.1 mmol/L (ref 3.5–5.1)
Sodium: 139 mmol/L (ref 135–145)
Total Bilirubin: 0.3 mg/dL (ref 0.3–1.2)
Total Protein: 6.1 g/dL — ABNORMAL LOW (ref 6.5–8.1)

## 2019-05-19 LAB — CBC WITH DIFFERENTIAL (CANCER CENTER ONLY)
Abs Immature Granulocytes: 0.15 10*3/uL — ABNORMAL HIGH (ref 0.00–0.07)
Basophils Absolute: 0.2 10*3/uL — ABNORMAL HIGH (ref 0.0–0.1)
Basophils Relative: 2 %
Eosinophils Absolute: 0.1 10*3/uL (ref 0.0–0.5)
Eosinophils Relative: 1 %
HCT: 35.2 % — ABNORMAL LOW (ref 36.0–46.0)
Hemoglobin: 11.2 g/dL — ABNORMAL LOW (ref 12.0–15.0)
Immature Granulocytes: 2 %
Lymphocytes Relative: 8 %
Lymphs Abs: 0.8 10*3/uL (ref 0.7–4.0)
MCH: 28.9 pg (ref 26.0–34.0)
MCHC: 31.8 g/dL (ref 30.0–36.0)
MCV: 90.7 fL (ref 80.0–100.0)
Monocytes Absolute: 0.3 10*3/uL (ref 0.1–1.0)
Monocytes Relative: 3 %
Neutro Abs: 8.6 10*3/uL — ABNORMAL HIGH (ref 1.7–7.7)
Neutrophils Relative %: 84 %
Platelet Count: 222 10*3/uL (ref 150–400)
RBC: 3.88 MIL/uL (ref 3.87–5.11)
RDW: 16.9 % — ABNORMAL HIGH (ref 11.5–15.5)
WBC Count: 10.1 10*3/uL (ref 4.0–10.5)
nRBC: 0 % (ref 0.0–0.2)

## 2019-05-19 LAB — LACTATE DEHYDROGENASE: LDH: 234 U/L — ABNORMAL HIGH (ref 98–192)

## 2019-05-19 MED ORDER — HEPARIN SOD (PORK) LOCK FLUSH 100 UNIT/ML IV SOLN
500.0000 [IU] | Freq: Once | INTRAVENOUS | Status: AC | PRN
  Administered 2019-05-19: 500 [IU]
  Filled 2019-05-19: qty 5

## 2019-05-19 MED ORDER — SODIUM CHLORIDE 0.9% FLUSH
10.0000 mL | INTRAVENOUS | Status: DC | PRN
  Administered 2019-05-19: 10 mL
  Filled 2019-05-19: qty 10

## 2019-05-26 ENCOUNTER — Inpatient Hospital Stay: Payer: BC Managed Care – PPO

## 2019-05-26 ENCOUNTER — Other Ambulatory Visit: Payer: Self-pay

## 2019-05-26 DIAGNOSIS — Z5189 Encounter for other specified aftercare: Secondary | ICD-10-CM | POA: Diagnosis not present

## 2019-05-26 DIAGNOSIS — E669 Obesity, unspecified: Secondary | ICD-10-CM | POA: Diagnosis not present

## 2019-05-26 DIAGNOSIS — Z5111 Encounter for antineoplastic chemotherapy: Secondary | ICD-10-CM | POA: Diagnosis not present

## 2019-05-26 DIAGNOSIS — C8318 Mantle cell lymphoma, lymph nodes of multiple sites: Secondary | ICD-10-CM | POA: Diagnosis not present

## 2019-05-26 DIAGNOSIS — Z452 Encounter for adjustment and management of vascular access device: Secondary | ICD-10-CM | POA: Diagnosis not present

## 2019-05-26 DIAGNOSIS — Z95828 Presence of other vascular implants and grafts: Secondary | ICD-10-CM

## 2019-05-26 DIAGNOSIS — E039 Hypothyroidism, unspecified: Secondary | ICD-10-CM | POA: Diagnosis not present

## 2019-05-26 DIAGNOSIS — C8310 Mantle cell lymphoma, unspecified site: Secondary | ICD-10-CM

## 2019-05-26 DIAGNOSIS — Z5112 Encounter for antineoplastic immunotherapy: Secondary | ICD-10-CM | POA: Diagnosis not present

## 2019-05-26 LAB — CMP (CANCER CENTER ONLY)
ALT: 15 U/L (ref 0–44)
AST: 20 U/L (ref 15–41)
Albumin: 4 g/dL (ref 3.5–5.0)
Alkaline Phosphatase: 113 U/L (ref 38–126)
Anion gap: 10 (ref 5–15)
BUN: 10 mg/dL (ref 8–23)
CO2: 24 mmol/L (ref 22–32)
Calcium: 9.5 mg/dL (ref 8.9–10.3)
Chloride: 105 mmol/L (ref 98–111)
Creatinine: 0.79 mg/dL (ref 0.44–1.00)
GFR, Est AFR Am: >60 mL/min (ref >60)
GFR, Estimated: >60 mL/min (ref >60)
Glucose, Bld: 87 mg/dL (ref 70–99)
Potassium: 4.3 mmol/L (ref 3.5–5.1)
Sodium: 139 mmol/L (ref 135–145)
Total Bilirubin: <0.2 mg/dL — ABNORMAL LOW (ref 0.3–1.2)
Total Protein: 6.8 g/dL (ref 6.5–8.1)

## 2019-05-26 LAB — CBC WITH DIFFERENTIAL (CANCER CENTER ONLY)
Abs Immature Granulocytes: 2.23 10*3/uL — ABNORMAL HIGH (ref 0.00–0.07)
Basophils Absolute: 0.2 10*3/uL — ABNORMAL HIGH (ref 0.0–0.1)
Basophils Relative: 1 %
Eosinophils Absolute: 0.1 10*3/uL (ref 0.0–0.5)
Eosinophils Relative: 1 %
HCT: 37.8 % (ref 36.0–46.0)
Hemoglobin: 11.9 g/dL — ABNORMAL LOW (ref 12.0–15.0)
Immature Granulocytes: 13 %
Lymphocytes Relative: 8 %
Lymphs Abs: 1.4 10*3/uL (ref 0.7–4.0)
MCH: 28.7 pg (ref 26.0–34.0)
MCHC: 31.5 g/dL (ref 30.0–36.0)
MCV: 91.1 fL (ref 80.0–100.0)
Monocytes Absolute: 1.2 10*3/uL — ABNORMAL HIGH (ref 0.1–1.0)
Monocytes Relative: 7 %
Neutro Abs: 11.9 10*3/uL — ABNORMAL HIGH (ref 1.7–7.7)
Neutrophils Relative %: 70 %
Platelet Count: 194 10*3/uL (ref 150–400)
RBC: 4.15 MIL/uL (ref 3.87–5.11)
RDW: 17 % — ABNORMAL HIGH (ref 11.5–15.5)
WBC Count: 17 10*3/uL — ABNORMAL HIGH (ref 4.0–10.5)
nRBC: 0.9 % — ABNORMAL HIGH (ref 0.0–0.2)

## 2019-05-26 LAB — LACTATE DEHYDROGENASE: LDH: 278 U/L — ABNORMAL HIGH (ref 98–192)

## 2019-05-26 MED ORDER — SODIUM CHLORIDE 0.9% FLUSH
10.0000 mL | INTRAVENOUS | Status: DC | PRN
  Filled 2019-05-26: qty 10

## 2019-05-26 MED ORDER — HEPARIN SOD (PORK) LOCK FLUSH 100 UNIT/ML IV SOLN
500.0000 [IU] | Freq: Once | INTRAVENOUS | Status: DC | PRN
  Filled 2019-05-26: qty 5

## 2019-06-01 ENCOUNTER — Other Ambulatory Visit: Payer: Self-pay

## 2019-06-01 DIAGNOSIS — C8316 Mantle cell lymphoma, intrapelvic lymph nodes: Secondary | ICD-10-CM

## 2019-06-02 ENCOUNTER — Other Ambulatory Visit: Payer: Self-pay

## 2019-06-02 ENCOUNTER — Inpatient Hospital Stay: Payer: BC Managed Care – PPO

## 2019-06-02 DIAGNOSIS — E039 Hypothyroidism, unspecified: Secondary | ICD-10-CM | POA: Diagnosis not present

## 2019-06-02 DIAGNOSIS — C8316 Mantle cell lymphoma, intrapelvic lymph nodes: Secondary | ICD-10-CM

## 2019-06-02 DIAGNOSIS — E669 Obesity, unspecified: Secondary | ICD-10-CM | POA: Diagnosis not present

## 2019-06-02 DIAGNOSIS — Z5189 Encounter for other specified aftercare: Secondary | ICD-10-CM | POA: Diagnosis not present

## 2019-06-02 DIAGNOSIS — C8310 Mantle cell lymphoma, unspecified site: Secondary | ICD-10-CM

## 2019-06-02 DIAGNOSIS — Z452 Encounter for adjustment and management of vascular access device: Secondary | ICD-10-CM | POA: Diagnosis not present

## 2019-06-02 DIAGNOSIS — Z5112 Encounter for antineoplastic immunotherapy: Secondary | ICD-10-CM | POA: Diagnosis not present

## 2019-06-02 DIAGNOSIS — C8318 Mantle cell lymphoma, lymph nodes of multiple sites: Secondary | ICD-10-CM | POA: Diagnosis not present

## 2019-06-02 DIAGNOSIS — Z5111 Encounter for antineoplastic chemotherapy: Secondary | ICD-10-CM | POA: Diagnosis not present

## 2019-06-02 LAB — CMP (CANCER CENTER ONLY)
ALT: 20 U/L (ref 0–44)
AST: 26 U/L (ref 15–41)
Albumin: 3.9 g/dL (ref 3.5–5.0)
Alkaline Phosphatase: 70 U/L (ref 38–126)
Anion gap: 10 (ref 5–15)
BUN: 14 mg/dL (ref 8–23)
CO2: 26 mmol/L (ref 22–32)
Calcium: 9.4 mg/dL (ref 8.9–10.3)
Chloride: 104 mmol/L (ref 98–111)
Creatinine: 0.82 mg/dL (ref 0.44–1.00)
GFR, Est AFR Am: >60 mL/min (ref >60)
GFR, Estimated: >60 mL/min (ref >60)
Glucose, Bld: 89 mg/dL (ref 70–99)
Potassium: 4.2 mmol/L (ref 3.5–5.1)
Sodium: 140 mmol/L (ref 135–145)
Total Bilirubin: 0.2 mg/dL — ABNORMAL LOW (ref 0.3–1.2)
Total Protein: 6.7 g/dL (ref 6.5–8.1)

## 2019-06-02 LAB — CBC WITH DIFFERENTIAL (CANCER CENTER ONLY)
Abs Immature Granulocytes: 0.09 10*3/uL — ABNORMAL HIGH (ref 0.00–0.07)
Basophils Absolute: 0.1 10*3/uL (ref 0.0–0.1)
Basophils Relative: 1 %
Eosinophils Absolute: 0 10*3/uL (ref 0.0–0.5)
Eosinophils Relative: 0 %
HCT: 36.4 % (ref 36.0–46.0)
Hemoglobin: 11.5 g/dL — ABNORMAL LOW (ref 12.0–15.0)
Immature Granulocytes: 1 %
Lymphocytes Relative: 11 %
Lymphs Abs: 0.9 10*3/uL (ref 0.7–4.0)
MCH: 29 pg (ref 26.0–34.0)
MCHC: 31.6 g/dL (ref 30.0–36.0)
MCV: 91.7 fL (ref 80.0–100.0)
Monocytes Absolute: 1 10*3/uL (ref 0.1–1.0)
Monocytes Relative: 12 %
Neutro Abs: 5.8 10*3/uL (ref 1.7–7.7)
Neutrophils Relative %: 75 %
Platelet Count: 308 10*3/uL (ref 150–400)
RBC: 3.97 MIL/uL (ref 3.87–5.11)
RDW: 15.9 % — ABNORMAL HIGH (ref 11.5–15.5)
WBC Count: 7.9 10*3/uL (ref 4.0–10.5)
nRBC: 0.3 % — ABNORMAL HIGH (ref 0.0–0.2)

## 2019-06-02 LAB — URIC ACID: Uric Acid, Serum: 3.9 mg/dL (ref 2.5–7.1)

## 2019-06-02 LAB — LACTATE DEHYDROGENASE: LDH: 216 U/L — ABNORMAL HIGH (ref 98–192)

## 2019-06-10 ENCOUNTER — Encounter (HOSPITAL_COMMUNITY)
Admission: RE | Admit: 2019-06-10 | Discharge: 2019-06-10 | Disposition: A | Discharge status: Home / Self Care | Payer: BC Managed Care – PPO | Source: Ambulatory Visit | Attending: Physician Assistant | Admitting: Physician Assistant

## 2019-06-10 ENCOUNTER — Other Ambulatory Visit: Payer: Self-pay

## 2019-06-10 DIAGNOSIS — R5383 Other fatigue: Secondary | ICD-10-CM | POA: Diagnosis not present

## 2019-06-10 DIAGNOSIS — N951 Menopausal and female climacteric states: Secondary | ICD-10-CM | POA: Diagnosis not present

## 2019-06-10 DIAGNOSIS — C8313 Mantle cell lymphoma, intra-abdominal lymph nodes: Secondary | ICD-10-CM | POA: Diagnosis not present

## 2019-06-10 DIAGNOSIS — E039 Hypothyroidism, unspecified: Secondary | ICD-10-CM | POA: Diagnosis not present

## 2019-06-10 DIAGNOSIS — E559 Vitamin D deficiency, unspecified: Secondary | ICD-10-CM | POA: Diagnosis not present

## 2019-06-10 DIAGNOSIS — C8310 Mantle cell lymphoma, unspecified site: Secondary | ICD-10-CM | POA: Diagnosis not present

## 2019-06-10 LAB — GLUCOSE, CAPILLARY: Glucose-Capillary: 104 mg/dL — ABNORMAL HIGH (ref 70–99)

## 2019-06-10 MED ORDER — FLUDEOXYGLUCOSE F - 18 (FDG) INJECTION
10.1000 | Freq: Once | INTRAVENOUS | Status: AC | PRN
  Administered 2019-06-10: 10.1 via INTRAVENOUS

## 2019-06-14 ENCOUNTER — Other Ambulatory Visit: Payer: Self-pay

## 2019-06-14 ENCOUNTER — Encounter: Payer: Self-pay | Admitting: Internal Medicine

## 2019-06-14 ENCOUNTER — Telehealth: Payer: Self-pay | Admitting: Internal Medicine

## 2019-06-14 ENCOUNTER — Inpatient Hospital Stay: Payer: BC Managed Care – PPO | Attending: Physician Assistant | Admitting: Internal Medicine

## 2019-06-14 VITALS — BP 116/80 | HR 127 | Temp 97.8°F | Resp 20 | Ht 62.0 in | Wt 203.3 lb

## 2019-06-14 DIAGNOSIS — C8313 Mantle cell lymphoma, intra-abdominal lymph nodes: Secondary | ICD-10-CM | POA: Diagnosis not present

## 2019-06-14 DIAGNOSIS — Z8572 Personal history of non-Hodgkin lymphomas: Secondary | ICD-10-CM | POA: Diagnosis not present

## 2019-06-14 NOTE — Progress Notes (Signed)
[place] [phone]   [phone]  OFFICE PROGRESS NOTE  [name], MD [address] [place] [address]  DIAGNOSIS: Stage IV non-Hodgkin's lymphoma, mantle cell lymphoma. She presented with left iliac and retroperitoneal lymphadenopathy. She also presented with left axillary lymphadenopathy and bone marrow involvement. She was diagnosed in December 2020.  PRIOR THERAPY: Systemic chemotherapy with R-CHOPwithNeulasta support. First dose on 01/27/2019. Status post 6 cycles.   CURRENT THERAPY: Observation.  INTERVAL HISTORY: Natalie Baker 64 y.o. female returns to the clinic today for follow-up visit.  Her daughter [name] was available by phone during the visit.  The patient is feeling fine today with no concerning complaints.  She is very anxious about her scan results and her heart rate is elevated.  She denied having any current chest pain, shortness of breath, cough or hemoptysis.  She denied having any fever or chills.  She has no nausea, vomiting, diarrhea or constipation.  She denied having any headache or visual changes.  She has been tolerating her systemic chemotherapy with R-CHOP fairly well.  The patient had repeat PET scan performed recently and she is here for evaluation and discussion of her risk her results.  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  MEDICATIONS:  Current Outpatient Medications  Medication Sig Dispense Refill   allopurinol (ZYLOPRIM) 100 MG tablet TAKE 1 TABLET BY MOUTH TWICE A DAY 180 tablet 1   ARMOUR THYROID 90 MG tablet  Take 90 mg by mouth every morning.     estradiol (VIVELLE-DOT) 0.075 MG/24HR APPLY NEW PATCH TWICE WEEKLY     lidocaine-prilocaine (EMLA) cream Apply 1 application topically as needed. 30 g 0   ondansetron (ZOFRAN) 8 MG tablet Take 1 tablet (8 mg total) by mouth every 8 (eight) hours as needed for nausea or vomiting. 30 tablet 1   predniSONE (DELTASONE) 50 MG tablet Please take 2 tablets for 5 consecutive days starting on the first day of chemotherapy every cycle. 40 tablet 2   prochlorperazine (COMPAZINE) 10 MG tablet TAKE 1 TABLET (10 MG TOTAL) BY MOUTH EVERY 6 (SIX) HOURS AS NEEDED FOR NAUSEA OR VOMITING. 30 tablet 3   progesterone (PROMETRIUM) 100 MG capsule      rosuvastatin (CRESTOR) 5 MG tablet Take 1 tablet   3 x /week  (MWF)  for Cholesterol (Patient not taking: Reported on 05/13/2019) 36 tablet 1   No current facility-administered medications for this visit.    SURGICAL HISTORY:  Past Surgical History:  Procedure Laterality Date   ABDOMINAL HYSTERECTOMY  1997   CESAREAN SECTION  1983   COLONOSCOPY W/ BIOPSIES  12/29/2006   IR IMAGING GUIDED PORT INSERTION  01/15/2019   LAPAROSCOPY     1991 for emdometriosis   UPPER GASTROINTESTINAL ENDOSCOPY      REVIEW OF SYSTEMS:  Constitutional: negative Eyes: negative Ears, nose, mouth, throat, and face: negative Respiratory: negative Cardiovascular: negative Gastrointestinal: negative Genitourinary:negative Integument/breast: negative Hematologic/lymphatic: negative Musculoskeletal:negative Neurological: negative Behavioral/Psych: negative Endocrine: negative Allergic/Immunologic: negative   PHYSICAL  EXAMINATION: General appearance: alert, cooperative and no distress Head: Normocephalic, without obvious abnormality, atraumatic Neck: no adenopathy, no JVD, supple, symmetrical, trachea midline and thyroid not enlarged, symmetric, no tenderness/mass/nodules Lymph nodes: Cervical, supraclavicular, and axillary nodes  normal. Resp: clear to auscultation bilaterally Back: symmetric, no curvature. ROM normal. No CVA tenderness. Cardio: regular rate and rhythm, S1, S2 normal, no murmur, click, rub or gallop GI: soft, non-tender; bowel sounds normal; no masses,  no organomegaly Extremities: extremities normal, atraumatic, no cyanosis or edema Neurologic: Alert and oriented X 3, normal strength and tone. Normal symmetric reflexes. Normal coordination and gait  ECOG PERFORMANCE STATUS: 1 - Symptomatic but completely ambulatory  Blood pressure 116/80, pulse (!) 127, temperature 97.8 F (36.6 C), temperature source Temporal, resp. rate 20, height 5\' 2"  (1.575 m), weight 203 lb 4.8 oz (92.2 kg), SpO2 96 %.  LABORATORY DATA: Lab Results  Component Value Date   WBC 7.9 06/02/2019   HGB 11.5 (L) 06/02/2019   HCT 36.4 06/02/2019   MCV 91.7 06/02/2019   PLT 308 06/02/2019      Chemistry      Component Value Date/Time   NA 140 06/02/2019 1430   K 4.2 06/02/2019 1430   CL 104 06/02/2019 1430   CO2 26 06/02/2019 1430   BUN 14 06/02/2019 1430   CREATININE 0.82 06/02/2019 1430   CREATININE 0.74 12/21/2018 1513      Component Value Date/Time   CALCIUM 9.4 06/02/2019 1430   ALKPHOS 70 06/02/2019 1430   AST 26 06/02/2019 1430   ALT 20 06/02/2019 1430   BILITOT 0.2 (L) 06/02/2019 1430       RADIOGRAPHIC STUDIES: NM PET Image Restag (PS) Skull Base To Thigh  Result Date: 06/10/2019 CLINICAL DATA:  Subsequent treatment strategy for mantle cell lymphoma. EXAM: NUCLEAR MEDICINE PET SKULL BASE TO THIGH TECHNIQUE: 10.1 mCi F-18 FDG was injected intravenously. Full-ring PET imaging was performed from the skull base to thigh after the radiotracer. CT data was obtained and used for attenuation correction and anatomic localization. Fasting blood glucose: 104 mg/dl COMPARISON:  12/29/2018 and recent comparison from 03/29/2019 FINDINGS: Mediastinal blood pool activity: SUV max 3.5 Liver activity: SUV max 4.31 NECK:  Bilateral symmetric masseter activity likely physiologic. Scattered small lymph nodes in the neck with similar appearance. None with frankly hypermetabolic features. Borderline enlargement of a LEFT supraclavicular lymph node (image 43, series 4) no uptake beyond blood pool activity. This measures approximately 9 mm short axis previously approximately 14-15 mm short axis. Incidental CT findings: none CHEST: Similar appearance of LEFT axillary lymph nodes (SUVmax = 3.1 (image 52, series 4) findings unchanged compared to the previous imaging study. No mediastinal adenopathy.) RIGHT IJ Port-A-Cath terminates at the caval to atrial junction. Heart size is normal. Mild volume loss along the LEFT heart border and along the RIGHT middle lobe with similar appearance. 8 mm pulmonary nodule central to some areas of volume loss (image 71, series 4) unchanged based on size (SUVmax = 1.6) Incidental CT findings: As above ABDOMEN/PELVIS: Decreased splenic size and FDG uptake as compared to the previous exam. Spleen is normal size on today's study and with slightly enlarged on the previous PET exam. (SUVmax = 3.0 as compared to 5.2 Decreased size of a LEFT periaortic lymph node compared to the prior study (image 118, series 4) approximately 11 mm short axis, previously approximately 18 mm. (SUVmax = 3.2) previously 5.0 LEFT common iliac lymph node (image 136, series 4) 9 mm short axis (SUVmax = 3.3)  previously 5.5 LEFT external iliac lymph node (image 156, series 4) 14 mm short axis previously is much as 18 mm short axis (SUVmax = 4.0) previously 5.1 Incidental CT findings: Cholelithiasis as before. SKELETON: No focal hypermetabolic activity to suggest skeletal metastasis. Incidental CT findings: none IMPRESSION: 1. Decrease in size of retroperitoneal lymph nodes and associated metabolic activity Deauville category 2/3 with only a single lymph node above blood pool activity on the current examination. 2. Stable appearance of  axillary lymph nodes with slight decrease in size of cervical lymph nodes as described. Attention on follow-up. 3. Diminished size of the spleen and decreased splenic activity as compared to the previous study. 4. Bilateral masseter uptake likely related to physiologic uptake. Attention on follow-up. 5. Stable appearance of a small pulmonary nodule in the chest in the RIGHT middle lobe. No associated increased metabolic activity. Electronically Signed   By: Zetta Bills M.D.   On: 06/10/2019 14:00    ASSESSMENT AND PLAN: This is a very pleasant 64 years old white female recently diagnosed with stage IV less aggressive mantle cell lymphoma presented with left iliac and retroperitoneal lymphadenopathy as well as left axillary and bone marrow involvement in December 2020. The patient completed 6 cycles of systemic chemotherapy with R-CHOP.  She tolerated her treatment fairly well with no concerning adverse effects. She had repeat PET scan performed recently.  I personally and independently reviewed the scans and discussed the results with the patient and her daughter. Her scan showed no concerning findings for disease progression and it continues to show some improvement of her disease. I recommend for the patient to continue on observation with repeat CBC, comprehensive metabolic panel, LDH as well as CT scan of the chest, abdomen and pelvis in 6 months. I will arrange for the patient to have Port-A-Cath flush every 6 weeks. She was advised to call immediately if she has any concerning symptoms in the interval. The patient voices understanding of current disease status and treatment options and is in agreement with the current care plan.  All questions were answered. The patient knows to call the clinic with any problems, questions or concerns. We can certainly see the patient much sooner if necessary.  Disclaimer: This note was dictated with voice recognition software. Similar sounding words can  inadvertently be transcribed and may not be corrected upon review.

## 2019-06-14 NOTE — Telephone Encounter (Signed)
Scheduled apt per 6/7 los - gave patient AVS and calender per los.

## 2019-06-18 ENCOUNTER — Encounter: Payer: Self-pay | Admitting: Internal Medicine

## 2019-06-21 ENCOUNTER — Encounter (HOSPITAL_COMMUNITY): Payer: Self-pay | Admitting: Emergency Medicine

## 2019-06-21 ENCOUNTER — Emergency Department (HOSPITAL_COMMUNITY): Payer: BC Managed Care – PPO

## 2019-06-21 ENCOUNTER — Inpatient Hospital Stay (HOSPITAL_COMMUNITY)
Admit: 2019-06-21 | Discharge: 2019-07-02 | DRG: 286 | Disposition: A | Discharge status: Home Health | Payer: BC Managed Care – PPO | Attending: Internal Medicine | Admitting: Internal Medicine

## 2019-06-21 ENCOUNTER — Other Ambulatory Visit: Payer: Self-pay

## 2019-06-21 DIAGNOSIS — Z6835 Body mass index (BMI) 35.0-35.9, adult: Secondary | ICD-10-CM | POA: Diagnosis not present

## 2019-06-21 DIAGNOSIS — E785 Hyperlipidemia, unspecified: Secondary | ICD-10-CM | POA: Diagnosis present

## 2019-06-21 DIAGNOSIS — C8310 Mantle cell lymphoma, unspecified site: Secondary | ICD-10-CM | POA: Diagnosis present

## 2019-06-21 DIAGNOSIS — N3000 Acute cystitis without hematuria: Secondary | ICD-10-CM | POA: Diagnosis not present

## 2019-06-21 DIAGNOSIS — R748 Abnormal levels of other serum enzymes: Secondary | ICD-10-CM | POA: Diagnosis present

## 2019-06-21 DIAGNOSIS — R7401 Elevation of levels of liver transaminase levels: Secondary | ICD-10-CM | POA: Diagnosis not present

## 2019-06-21 DIAGNOSIS — I110 Hypertensive heart disease with heart failure: Principal | ICD-10-CM | POA: Diagnosis present

## 2019-06-21 DIAGNOSIS — I428 Other cardiomyopathies: Secondary | ICD-10-CM | POA: Diagnosis present

## 2019-06-21 DIAGNOSIS — E860 Dehydration: Secondary | ICD-10-CM | POA: Diagnosis present

## 2019-06-21 DIAGNOSIS — Z8249 Family history of ischemic heart disease and other diseases of the circulatory system: Secondary | ICD-10-CM

## 2019-06-21 DIAGNOSIS — Z803 Family history of malignant neoplasm of breast: Secondary | ICD-10-CM

## 2019-06-21 DIAGNOSIS — E876 Hypokalemia: Secondary | ICD-10-CM | POA: Diagnosis not present

## 2019-06-21 DIAGNOSIS — R7989 Other specified abnormal findings of blood chemistry: Secondary | ICD-10-CM | POA: Diagnosis not present

## 2019-06-21 DIAGNOSIS — Z79890 Hormone replacement therapy (postmenopausal): Secondary | ICD-10-CM

## 2019-06-21 DIAGNOSIS — Z83438 Family history of other disorder of lipoprotein metabolism and other lipidemia: Secondary | ICD-10-CM

## 2019-06-21 DIAGNOSIS — Z452 Encounter for adjustment and management of vascular access device: Secondary | ICD-10-CM

## 2019-06-21 DIAGNOSIS — Z8349 Family history of other endocrine, nutritional and metabolic diseases: Secondary | ICD-10-CM

## 2019-06-21 DIAGNOSIS — Z87891 Personal history of nicotine dependence: Secondary | ICD-10-CM

## 2019-06-21 DIAGNOSIS — R1011 Right upper quadrant pain: Secondary | ICD-10-CM | POA: Diagnosis not present

## 2019-06-21 DIAGNOSIS — I5023 Acute on chronic systolic (congestive) heart failure: Secondary | ICD-10-CM | POA: Diagnosis not present

## 2019-06-21 DIAGNOSIS — I5021 Acute systolic (congestive) heart failure: Secondary | ICD-10-CM | POA: Diagnosis not present

## 2019-06-21 DIAGNOSIS — I472 Ventricular tachycardia: Secondary | ICD-10-CM | POA: Diagnosis not present

## 2019-06-21 DIAGNOSIS — R570 Cardiogenic shock: Secondary | ICD-10-CM | POA: Diagnosis not present

## 2019-06-21 DIAGNOSIS — Z9221 Personal history of antineoplastic chemotherapy: Secondary | ICD-10-CM | POA: Diagnosis not present

## 2019-06-21 DIAGNOSIS — N179 Acute kidney failure, unspecified: Secondary | ICD-10-CM | POA: Diagnosis not present

## 2019-06-21 DIAGNOSIS — R0602 Shortness of breath: Secondary | ICD-10-CM | POA: Diagnosis not present

## 2019-06-21 DIAGNOSIS — J90 Pleural effusion, not elsewhere classified: Secondary | ICD-10-CM | POA: Diagnosis not present

## 2019-06-21 DIAGNOSIS — E875 Hyperkalemia: Secondary | ICD-10-CM | POA: Diagnosis not present

## 2019-06-21 DIAGNOSIS — Z79899 Other long term (current) drug therapy: Secondary | ICD-10-CM

## 2019-06-21 DIAGNOSIS — R109 Unspecified abdominal pain: Secondary | ICD-10-CM | POA: Diagnosis not present

## 2019-06-21 DIAGNOSIS — R0600 Dyspnea, unspecified: Secondary | ICD-10-CM | POA: Diagnosis not present

## 2019-06-21 DIAGNOSIS — J939 Pneumothorax, unspecified: Secondary | ICD-10-CM | POA: Diagnosis not present

## 2019-06-21 DIAGNOSIS — E039 Hypothyroidism, unspecified: Secondary | ICD-10-CM | POA: Diagnosis present

## 2019-06-21 DIAGNOSIS — I361 Nonrheumatic tricuspid (valve) insufficiency: Secondary | ICD-10-CM | POA: Diagnosis not present

## 2019-06-21 DIAGNOSIS — J9601 Acute respiratory failure with hypoxia: Secondary | ICD-10-CM | POA: Diagnosis not present

## 2019-06-21 DIAGNOSIS — Z20822 Contact with and (suspected) exposure to covid-19: Secondary | ICD-10-CM | POA: Diagnosis not present

## 2019-06-21 DIAGNOSIS — Z833 Family history of diabetes mellitus: Secondary | ICD-10-CM

## 2019-06-21 DIAGNOSIS — I517 Cardiomegaly: Secondary | ICD-10-CM | POA: Diagnosis not present

## 2019-06-21 DIAGNOSIS — K810 Acute cholecystitis: Secondary | ICD-10-CM | POA: Diagnosis not present

## 2019-06-21 DIAGNOSIS — I340 Nonrheumatic mitral (valve) insufficiency: Secondary | ICD-10-CM | POA: Diagnosis not present

## 2019-06-21 DIAGNOSIS — I5082 Biventricular heart failure: Secondary | ICD-10-CM | POA: Diagnosis not present

## 2019-06-21 DIAGNOSIS — K8000 Calculus of gallbladder with acute cholecystitis without obstruction: Secondary | ICD-10-CM | POA: Diagnosis not present

## 2019-06-21 DIAGNOSIS — R7303 Prediabetes: Secondary | ICD-10-CM | POA: Diagnosis not present

## 2019-06-21 DIAGNOSIS — K8020 Calculus of gallbladder without cholecystitis without obstruction: Secondary | ICD-10-CM | POA: Diagnosis not present

## 2019-06-21 DIAGNOSIS — N3001 Acute cystitis with hematuria: Secondary | ICD-10-CM | POA: Diagnosis not present

## 2019-06-21 DIAGNOSIS — R000 Tachycardia, unspecified: Secondary | ICD-10-CM

## 2019-06-21 DIAGNOSIS — I5020 Unspecified systolic (congestive) heart failure: Secondary | ICD-10-CM

## 2019-06-21 LAB — URINALYSIS, ROUTINE W REFLEX MICROSCOPIC
Bilirubin Urine: NEGATIVE
Glucose, UA: NEGATIVE mg/dL
Ketones, ur: NEGATIVE mg/dL
Leukocytes,Ua: NEGATIVE
Nitrite: NEGATIVE
Protein, ur: 100 mg/dL — AB
Specific Gravity, Urine: 1.023 (ref 1.005–1.030)
pH: 5 (ref 5.0–8.0)

## 2019-06-21 LAB — CBC WITH DIFFERENTIAL/PLATELET
Abs Immature Granulocytes: 0.05 10*3/uL (ref 0.00–0.07)
Basophils Absolute: 0.1 10*3/uL (ref 0.0–0.1)
Basophils Relative: 1 %
Eosinophils Absolute: 0 10*3/uL (ref 0.0–0.5)
Eosinophils Relative: 0 %
HCT: 44 % (ref 36.0–46.0)
Hemoglobin: 13.8 g/dL (ref 12.0–15.0)
Immature Granulocytes: 1 %
Lymphocytes Relative: 11 %
Lymphs Abs: 1 10*3/uL (ref 0.7–4.0)
MCH: 28.9 pg (ref 26.0–34.0)
MCHC: 31.4 g/dL (ref 30.0–36.0)
MCV: 92.1 fL (ref 80.0–100.0)
Monocytes Absolute: 1 10*3/uL (ref 0.1–1.0)
Monocytes Relative: 11 %
Neutro Abs: 7.4 10*3/uL (ref 1.7–7.7)
Neutrophils Relative %: 76 %
Platelets: 298 10*3/uL (ref 150–400)
RBC: 4.78 MIL/uL (ref 3.87–5.11)
RDW: 14.8 % (ref 11.5–15.5)
WBC: 9.5 10*3/uL (ref 4.0–10.5)
nRBC: 0 % (ref 0.0–0.2)

## 2019-06-21 LAB — TSH: TSH: 5.001 u[IU]/mL — ABNORMAL HIGH (ref 0.350–4.500)

## 2019-06-21 LAB — COMPREHENSIVE METABOLIC PANEL
ALT: 71 U/L — ABNORMAL HIGH (ref 0–44)
AST: 68 U/L — ABNORMAL HIGH (ref 15–41)
Albumin: 4.1 g/dL (ref 3.5–5.0)
Alkaline Phosphatase: 76 U/L (ref 38–126)
Anion gap: 11 (ref 5–15)
BUN: 16 mg/dL (ref 8–23)
CO2: 20 mmol/L — ABNORMAL LOW (ref 22–32)
Calcium: 8.9 mg/dL (ref 8.9–10.3)
Chloride: 105 mmol/L (ref 98–111)
Creatinine, Ser: 1.02 mg/dL — ABNORMAL HIGH (ref 0.44–1.00)
GFR calc Af Amer: >60 mL/min (ref >60)
GFR calc non Af Amer: 58 mL/min — ABNORMAL LOW (ref >60)
Glucose, Bld: 114 mg/dL — ABNORMAL HIGH (ref 70–99)
Potassium: 5.7 mmol/L — ABNORMAL HIGH (ref 3.5–5.1)
Sodium: 136 mmol/L (ref 135–145)
Total Bilirubin: 0.7 mg/dL (ref 0.3–1.2)
Total Protein: 6.7 g/dL (ref 6.5–8.1)

## 2019-06-21 LAB — LIPASE, BLOOD: Lipase: 21 U/L (ref 11–51)

## 2019-06-21 LAB — D-DIMER, QUANTITATIVE: D-Dimer, Quant: 1.35 ug/mL-FEU — ABNORMAL HIGH (ref 0.00–0.50)

## 2019-06-21 LAB — TROPONIN I (HIGH SENSITIVITY)
Troponin I (High Sensitivity): 71 ng/L — ABNORMAL HIGH (ref <18)
Troponin I (High Sensitivity): 90 ng/L — ABNORMAL HIGH (ref <18)

## 2019-06-21 LAB — POTASSIUM: Potassium: 4.9 mmol/L (ref 3.5–5.1)

## 2019-06-21 LAB — SARS CORONAVIRUS 2 BY RT PCR (HOSPITAL ORDER, PERFORMED IN ~~LOC~~ HOSPITAL LAB): SARS Coronavirus 2: NEGATIVE

## 2019-06-21 MED ORDER — SODIUM CHLORIDE 0.9 % IV SOLN
1000.0000 mL | INTRAVENOUS | Status: DC
  Administered 2019-06-21: 1000 mL via INTRAVENOUS

## 2019-06-21 MED ORDER — MORPHINE SULFATE (PF) 4 MG/ML IV SOLN
4.0000 mg | Freq: Once | INTRAVENOUS | Status: AC
  Administered 2019-06-21: 4 mg via INTRAVENOUS
  Filled 2019-06-21: qty 1

## 2019-06-21 MED ORDER — SODIUM CHLORIDE 0.9 % IV BOLUS (SEPSIS)
1000.0000 mL | Freq: Once | INTRAVENOUS | Status: AC
  Administered 2019-06-21: 1000 mL via INTRAVENOUS

## 2019-06-21 MED ORDER — IOHEXOL 350 MG/ML SOLN
100.0000 mL | Freq: Once | INTRAVENOUS | Status: AC | PRN
  Administered 2019-06-21: 100 mL via INTRAVENOUS

## 2019-06-21 MED ORDER — HEPARIN (PORCINE) 25000 UT/250ML-% IV SOLN: 1200.0000 [IU]/h | INTRAVENOUS | Status: DC

## 2019-06-21 MED ORDER — HEPARIN BOLUS VIA INFUSION
2000.0000 [IU] | Freq: Once | INTRAVENOUS | Status: DC
  Filled 2019-06-21: qty 2000

## 2019-06-21 MED ORDER — ONDANSETRON HCL 4 MG/2ML IJ SOLN
4.0000 mg | Freq: Three times a day (TID) | INTRAMUSCULAR | Status: DC | PRN
  Administered 2019-06-21: 4 mg via INTRAVENOUS
  Filled 2019-06-21 (×3): qty 2

## 2019-06-21 MED ORDER — SODIUM CHLORIDE 0.9 % IV SOLN
1.0000 g | Freq: Once | INTRAVENOUS | Status: AC
  Administered 2019-06-21: 1 g via INTRAVENOUS
  Filled 2019-06-21: qty 10

## 2019-06-21 MED ORDER — SODIUM CHLORIDE (PF) 0.9 % IJ SOLN
INTRAMUSCULAR | Status: AC
  Filled 2019-06-21: qty 50

## 2019-06-21 MED ORDER — SODIUM CHLORIDE 0.9 % IV SOLN: INTRAVENOUS | Status: DC

## 2019-06-21 MED ORDER — METOPROLOL TARTRATE 5 MG/5ML IV SOLN: 5.0000 mg | INTRAVENOUS | Status: DC | PRN

## 2019-06-21 MED ORDER — ONDANSETRON HCL 4 MG/2ML IJ SOLN
4.0000 mg | Freq: Once | INTRAMUSCULAR | Status: AC
  Administered 2019-06-21: 4 mg via INTRAVENOUS
  Filled 2019-06-21: qty 2

## 2019-06-21 MED ORDER — METOPROLOL TARTRATE 5 MG/5ML IV SOLN
5.0000 mg | INTRAVENOUS | Status: DC | PRN
  Administered 2019-06-21: 5 mg via INTRAVENOUS
  Filled 2019-06-21: qty 5

## 2019-06-21 NOTE — Progress Notes (Signed)
Patient ringing red to yellow MEWS d/t elevated PR and RR for SOB with exertion. MEWS guidelines implemented. CN/MD made aware with orders and carried out. Will continue to monitor patient.

## 2019-06-21 NOTE — H&P (Signed)
History and Physical    Natalie Baker:[id number] DOB: 04-20-1955 DOA: 06/21/2019  PCP: [name], MD Patient coming from: Home  Chief Complaint: Dyspnea on exertion, abdominal pain  HPI: Natalie Baker is a 64 y.o. female with medical history significant of stage IV non-hodgkin's lymphoma, mantel cell lymphoma, essential hypertension, hypothyroidism, obesity. Patient presented secondary to 2-3 days of worsening dyspnea on exertion with associated epigastric abdominal pain. She reports associated nausea but no vomiting episodes.  ED Course: Vitals: Afebrile, pulse of 140s, respiration of 23, BP of 117/75, SpO2 of 94% on room air Labs: [place] of 5.7, CO2 of 20, creatinine of 1.02, AST/ALT of 68/71, troponin of 71,  Imaging: CTA chest significant for small bilateral pleural effusions with small pericardial effusion Medications/Course: Heparin drip (now discontinued), morphine, NS bolus, Zofran  Review of Systems: Review of Systems  Constitutional: Positive for diaphoresis (chronic issue since chemo). Negative for chills and fever.  Respiratory: Positive for shortness of breath. Negative for cough and sputum production.   Cardiovascular: Negative for chest pain and palpitations.  Gastrointestinal: Positive for abdominal pain and nausea. Negative for constipation, diarrhea and vomiting.  Neurological: Negative for tremors.  All other systems reviewed and are negative.   Past Medical History:  Diagnosis Date  . Allergy   . Cancer [facility])    being worked up for [place]  . Colon polyp 12/29/2006  . 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 Hyperlipidemia   . Hypothyroidism   . Low kidney function   . Obesity    BMI 34  . Osteoporosis   . Palpitations   . Vitamin D deficiency     Past Surgical History:  Procedure Laterality Date  . ABDOMINAL HYSTERECTOMY  1997  . CESAREAN SECTION  1983  . COLONOSCOPY W/ BIOPSIES  12/29/2006  . IR IMAGING GUIDED PORT INSERTION  01/15/2019   . LAPAROSCOPY     1991 for emdometriosis  . UPPER GASTROINTESTINAL ENDOSCOPY       reports that she quit smoking about 23 years ago. Her smoking use included cigarettes. She quit after 15.00 years of use. She has never used smokeless tobacco. She reports that she does not drink alcohol and does not use drugs.  Allergies  Allergen Reactions  . Ppd [Tuberculin Purified Protein Derivative]     Positive reaction.  Negative Chest Xray 06/16/12    Family History  Problem Relation Age of Onset  . Heart disease Mother 55       chf  . Diabetes Mother   . Thyroid disease Mother   . Cancer Mother 5       female/ skin  . Hypertension Mother   . Liver disease Mother   . Hypertension Father   . Alcohol abuse Father   . Heart disease Father   . Breast cancer Maternal Aunt   . Hyperlipidemia Maternal Aunt   . Heart disease Maternal Aunt   . Hyperlipidemia Brother   . Hypertension Brother        x 2  . Diabetes Brother   . Hypothyroidism Brother   . Liver disease Maternal Grandmother   . Diabetes Paternal Grandmother   . Bone cancer Maternal Aunt        and skull  . Breast cancer Sister 63  . Diabetes Sister   . Hypothyroidism Sister   . Hashimoto's thyroiditis Sister   . Breast cancer Niece 61  . Colon cancer Neg Hx   . Colon polyps Neg Hx   . Esophageal  cancer Neg Hx   . Rectal cancer Neg Hx   . Stomach cancer Neg Hx    Prior to Admission medications   Medication Sig Start Date End Date Taking? Authorizing Provider  ARMOUR THYROID 90 MG tablet Take 90 mg by mouth every morning. 01/22/19  Yes [provider]  estradiol (VIVELLE-DOT) 0.075 MG/24HR Place 1 patch onto the skin 2 (two) times a week.  04/14/19  Yes [provider]  lidocaine-prilocaine (EMLA) cream Apply 1 application topically as needed. Patient taking differently: Apply 1 application topically as needed (for port access).  01/12/19  Yes Heilingoetter, Cassandra L, PA-C  progesterone (PROMETRIUM) 100  MG capsule Take 100 mg by mouth daily.  01/19/18  Yes [provider]  allopurinol (ZYLOPRIM) 100 MG tablet TAKE 1 TABLET BY MOUTH TWICE A DAY Patient not taking: Reported on 06/21/2019 04/20/19   Heilingoetter, Cassandra L, PA-C  ondansetron (ZOFRAN) 8 MG tablet Take 1 tablet (8 mg total) by mouth every 8 (eight) hours as needed for nausea or vomiting. Patient not taking: Reported on 06/21/2019 02/03/19   Heilingoetter, Cassandra L, PA-C  predniSONE (DELTASONE) 50 MG tablet Please take 2 tablets for 5 consecutive days starting on the first day of chemotherapy every cycle. Patient not taking: Reported on 06/21/2019 01/20/19   Heilingoetter, Cassandra L, PA-C  prochlorperazine (COMPAZINE) 10 MG tablet TAKE 1 TABLET (10 MG TOTAL) BY MOUTH EVERY 6 (SIX) HOURS AS NEEDED FOR NAUSEA OR VOMITING. Patient not taking: Reported on 06/21/2019 04/04/19   Heilingoetter, Cassandra L, PA-C  rosuvastatin (CRESTOR) 5 MG tablet Take 1 tablet   3 x /week  (MWF)  for Cholesterol Patient not taking: Reported on 05/13/2019 03/10/19   [name], MD  prochlorperazine (COMPAZINE) 10 MG tablet Take 1 tablet (10 mg total) by mouth every 6 (six) hours as needed for nausea or vomiting. 02/03/19   Heilingoetter, Cassandra L, PA-C    Physical Exam:  Physical Exam Vitals reviewed.  Constitutional:      General: She is not in acute distress.    Appearance: Normal appearance. She is well-developed. She is not diaphoretic.  HENT:     Mouth/Throat:     Mouth: Mucous membranes are dry.     Pharynx: Oropharynx is clear.  Eyes:     Conjunctiva/sclera: Conjunctivae normal.     Pupils: Pupils are equal, round, and reactive to light.  Cardiovascular:     Rate and Rhythm: Regular rhythm. Tachycardia present.     Heart sounds: Normal heart sounds. No murmur heard.   Pulmonary:     Effort: Pulmonary effort is normal. No respiratory distress.     Breath sounds: Normal breath sounds. No wheezing or rales.  Abdominal:      General: Bowel sounds are normal. There is no distension.     Palpations: Abdomen is soft.     Tenderness: There is abdominal tenderness (minimal) in the right upper quadrant and epigastric area. There is no guarding or rebound.  Musculoskeletal:        General: No tenderness. Normal range of motion.     Cervical back: Normal range of motion.     Right lower leg: 1+ Pitting Edema present.     Left lower leg: 1+ Pitting Edema present.  Lymphadenopathy:     Cervical: No cervical adenopathy.  Skin:    General: Skin is warm and dry.  Neurological:     General: No focal deficit present.     Mental Status: She is alert and  oriented to person, place, and time.     Labs on Admission: I have personally reviewed following labs and imaging studies  CBC: Recent Labs  Lab 06/21/19 1255  WBC 9.5  NEUTROABS 7.4  HGB 13.8  HCT 44.0  MCV 92.1  PLT 364    Basic Metabolic Panel: Recent Labs  Lab 06/21/19 1255  NA 136  K 5.7*  CL 105  CO2 20*  GLUCOSE 114*  BUN 16  CREATININE 1.02*  CALCIUM 8.9    GFR: Estimated Creatinine Clearance: 59.6 mL/min (A) (by C-G formula based on SCr of 1.02 mg/dL (H)).  Liver Function Tests: Recent Labs  Lab 06/21/19 1255  AST 68*  ALT 71*  ALKPHOS 76  BILITOT 0.7  PROT 6.7  ALBUMIN 4.1   Recent Labs  Lab 06/21/19 1255  LIPASE 21   No results for input(s): AMMONIA in the last 168 hours.  Coagulation Profile: No results for input(s): INR, PROTIME in the last 168 hours.  Cardiac Enzymes: No results for input(s): CKTOTAL, CKMB, CKMBINDEX, TROPONINI in the last 168 hours.  BNP (last 3 results) No results for input(s): PROBNP in the last 8760 hours.  HbA1C: No results for input(s): HGBA1C in the last 72 hours.  CBG: No results for input(s): GLUCAP in the last 168 hours.  Lipid Profile: No results for input(s): CHOL, HDL, LDLCALC, TRIG, CHOLHDL, LDLDIRECT in the last 72 hours.  Thyroid Function Tests: No results for input(s):  TSH, T4TOTAL, FREET4, T3FREE, THYROIDAB in the last 72 hours.  Anemia Panel: No results for input(s): VITAMINB12, FOLATE, FERRITIN, TIBC, IRON, RETICCTPCT in the last 72 hours.  Urine analysis:    Component Value Date/Time   COLORURINE YELLOW 06/21/2019 1425   APPEARANCEUR CLOUDY (A) 06/21/2019 1425   LABSPEC 1.023 06/21/2019 1425   PHURINE 5.0 06/21/2019 1425   GLUCOSEU NEGATIVE 06/21/2019 1425   HGBUR SMALL (A) 06/21/2019 1425   BILIRUBINUR NEGATIVE 06/21/2019 1425   KETONESUR NEGATIVE 06/21/2019 1425   PROTEINUR 100 (A) 06/21/2019 1425   UROBILINOGEN 0.2 06/21/2014 1108   NITRITE NEGATIVE 06/21/2019 1425   LEUKOCYTESUR NEGATIVE 06/21/2019 1425     Radiological Exams on Admission: CT Angio Chest PE W and/or Wo Contrast  Result Date: 06/21/2019 CLINICAL DATA:  Shortness of breath. History of mantle cell lymphoma. EXAM: CT ANGIOGRAPHY CHEST WITH CONTRAST TECHNIQUE: Multidetector CT imaging of the chest was performed using the standard protocol during bolus administration of intravenous contrast. Multiplanar CT image reconstructions and MIPs were obtained to evaluate the vascular anatomy. CONTRAST:  115mL OMNIPAQUE IOHEXOL 350 MG/ML SOLN COMPARISON:  CT scan dated 03/29/2019 and PET-CT dated 06/10/2019 FINDINGS: Cardiovascular: There is a slightly increased small pericardial effusion primarily around the ascending thoracic aorta. Heart size is normal. No pulmonary emboli. Mediastinum/Nodes: The patient is a few small bilateral axillary lymph nodes, less prominent than on the prior CT scan of 03/29/2019. No hilar or mediastinal adenopathy. Thyroid gland, trachea, and esophagus appear normal. Lungs/Pleura: The patient has new small bilateral pleural effusions, right greater than left. Slight atelectasis at the lung bases with minimal interstitial edema at the lung bases. No consolidative infiltrates. Focal area of chronic atelectasis at the medial aspect of the right middle lobe is stable.  Upper Abdomen: No acute abnormality. Musculoskeletal: No chest wall abnormality. No acute or significant osseous findings. Review of the MIP images confirms the above findings. IMPRESSION: 1. No pulmonary emboli. 2. New small bilateral pleural effusions, right greater than left. 3. Slight atelectasis at the lung bases  with minimal interstitial edema at the lung bases. 4. Slight increase in the small pericardial effusion. Electronically Signed   By: Lorriane Shire M.D.   On: 06/21/2019 15:28   DG Abdomen Acute W/Chest  Result Date: 06/21/2019 CLINICAL DATA:  Diffuse abdominal pain, nausea and vomiting beginning 2 days ago. Mantle cell lymphoma. EXAM: DG ABDOMEN ACUTE W/ 1V CHEST COMPARISON:  03/29/2019 FINDINGS: Power port inserted from a right approach has its tip in the SVC just below the azygos level. Cardiac silhouette is enlarged. There are bilateral effusions in there is density in both lower lobes consistent with atelectasis. Pneumonia not excluded. No free air. Abdominal bowel gas pattern does not show ileus or obstruction. No abnormal calcifications or bone findings. Ordinary lower lumbar degenerative changes. IMPRESSION: Pleural effusions and lower lung atelectasis. No acute or significant radiographic abdominal finding. Electronically Signed   By: Nelson Chimes M.D.   On: 06/21/2019 13:36    EKG: Independently reviewed. Sinus tachycardia  Assessment/Plan Active Problems:   Hyperlipidemia   Hypothyroidism   Morbid obesity (HCC) BMI 35+ with prediabetes, hyperlipidemia, htn   Prediabetes   Mantle cell lymphoma (HCC)   Sinus tachycardia   Sinus tachycardia CTA chest negative for PE. No obvious source for tachycardia. Patient is on Amour for hypothyroidism with no recent change in therapy. Patient reports having a TSH checked about 2 weeks prior but does not know the results. Patient given heparin drip for initial concern of PE. -Check TSH -Transthoracic  Echocardiogram -Telemetry -Cardiology recommendations -Metoprolol 5 mg IV prn HR >130 bpm  Dyspnea on exertion Related to tachycardia, most likely. CTA chest unremarkable for PE. Small pleural effusions noted. On room air at rest. -PT eval  Abdominal pain Associated elevated AST/ALT. CTA chest visualized most of the upper abdomen with no abnormalities noted. -Repeat CMP in AM -Tylenol prn  Elevated troponin -Obtain delta troponin -Telemetry -Transthoracic Echocardiogram  Hypothyroidism Patient is on Amour 90 mg as an outpatient -Continue Amour 90 mg -TSH as mentioned above  Elevated creatinine Mildly elevated. Baseline creatinine of 0.82. Likely secondary to mild dehydration. -IV fluids -BMP in AM  Abnormal UA Patient has no UTI symptoms. It may be possible tachycardia is secondary to infection. Started on empiric Ceftriaxone -Continue Ceftriaxone -Follow-up urine culture  Hyperkalemia Mildly elevated at 5.7. -Repeat potassium  History of Mantle Cell Lymphoma Currently in observation. Completed chemotherapy just over one month ago. Patient follows with Dr. Julien Nordmann.  Prediabetes Currently diet controlled. Hemoglobin A1C of 5.7% in 2020. -Carb modified diet  Hormone deficiency s/p bilateral hysterectomy with bilateral oophorectomy Patient is on estradiol and progesterone as an outpatient -Continue progesterone  Obesity Weight bending this admission.  DVT prophylaxis: Lovenox Code Status: Full code Family Communication: None at bedside Disposition Plan: Discharge possibly in 24 hours pending workup for tachycardia and cardiology recommendations Consults called: Cardiology Admission status: Observation   Cordelia Poche, MD [facility] 06/21/2019, 5:15 PM

## 2019-06-21 NOTE — ED Triage Notes (Signed)
Pt has lymphoma and had last treatment 5 weeks ago. Pt c/o not being able to breathe for 3-4 days with heaviness in chest when ambulating. Pt having abd pains with vomiting for same time as well.

## 2019-06-21 NOTE — Progress Notes (Deleted)
ANTICOAGULATION CONSULT NOTE - Initial Consult  Pharmacy Consult for IV heparin Indication: pulmonary embolus  Allergies  Allergen Reactions  . Ppd [Tuberculin Purified Protein Derivative]     Positive reaction.  Negative Chest Xray 06/16/12    Patient Measurements:   Heparin Dosing Weight: 71 kg  Vital Signs: Temp: 97.6 F (36.4 C) (06/14 1229) Temp Source: Oral (06/14 1229) BP: 104/80 (06/14 1359) Pulse Rate: 149 (06/14 1359)  Labs: Recent Labs    06/21/19 1255  HGB 13.8  HCT 44.0  PLT 298  CREATININE 1.02*  TROPONINIHS 71*    Estimated Creatinine Clearance: 59.6 mL/min (A) (by C-G formula based on SCr of 1.02 mg/dL (H)).   Medical History: Past Medical History:  Diagnosis Date  . Allergy   . Cancer [facility])    being worked up for [place]  . Colon polyp 12/29/2006  . 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 Hyperlipidemia   . Hypothyroidism   . Low kidney function   . Obesity    BMI 34  . Osteoporosis   . Palpitations   . Vitamin D deficiency     Medications:  Scheduled:   Infusions:  . sodium chloride 125 mL/hr at 06/21/19 1355  . sodium chloride     Followed by  . 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 Baseline labs obtained. Not on anticoagulation prior to admission  Goal of Therapy:  Heparin level 0.3-0.7 units/ml Monitor platelets by anticoagulation protocol: Yes   Plan:   IV heparin bolus of 2000 units then  IV heparin infusion rate of 1200 units/hr  Check heparin level 6 hours after start of IV heparin  Daily CBC  Kara Mead 06/21/2019,2:45 PM

## 2019-06-21 NOTE — Progress Notes (Signed)
DtrKenney Houseman updated about patient's condition.

## 2019-06-21 NOTE — ED Notes (Signed)
Report called for pt transfer to the floor  

## 2019-06-21 NOTE — ED Provider Notes (Addendum)
[facility] DEPT Provider Note   CSN: [id number] Arrival date & time: 06/21/19  1213     History Abdominal pain and shortness of breath.  Natalie Baker is a 64 y.o. female.  HPI   Patient presented to the emergency room for evaluation shortness of breath and abdominal discomfort.  Patient has a history of lymphoma.  Her last treatment was several weeks ago.  Patient states in the last few days she has noticed difficulty with breathing.  She is also had some heaviness in her chest.  She also started having abdominal pain in her upper abdomen with nausea and vomiting.  She denies any fevers or chills.  No leg swelling.  Patient denies any dysuria or diarrhea.  She does not have any history of DVT or PE.  Past Medical History:  Diagnosis Date  . Allergy   . Cancer [facility])    being worked up for [place]  . Colon polyp 12/29/2006  . 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 Hyperlipidemia   . Hypothyroidism   . Low kidney function   . Obesity    BMI 34  . Osteoporosis   . Palpitations   . Vitamin D deficiency     Patient Active Problem List   Diagnosis Date Noted  . Port-A-Cath in place 02/03/2019  . Encounter for antineoplastic chemotherapy 01/20/2019  . Mantle cell lymphoma ([place]) 01/12/2019  . Goals of care, counseling/discussion 01/12/2019  . Aortic atherosclerosis ([place]) 12/22/2018  . Prediabetes 07/26/2015  . 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 Morbid obesity ([place]) BMI 35+ with prediabetes, hyperlipidemia, htn   . Osteoporosis   . Hypothyroidism 12/25/2012  . Hyperlipidemia   . Vitamin D deficiency     Past Surgical History:  Procedure Laterality Date  . ABDOMINAL HYSTERECTOMY  1997  . CESAREAN SECTION  1983  . COLONOSCOPY W/ BIOPSIES  12/29/2006  . IR IMAGING GUIDED PORT INSERTION  01/15/2019  . LAPAROSCOPY     1991 for emdometriosis  . UPPER GASTROINTESTINAL ENDOSCOPY       OB History   No obstetric history on file.     Family History   Problem Relation Age of Onset  . Heart disease Mother 41       chf  . Diabetes Mother   . Thyroid disease Mother   . Cancer Mother 70       female/ skin  . Hypertension Mother   . Liver disease Mother   . Hypertension Father   . Alcohol abuse Father   . Heart disease Father   . Breast cancer Maternal Aunt   . Hyperlipidemia Maternal Aunt   . Heart disease Maternal Aunt   . Hyperlipidemia Brother   . Hypertension Brother        x 2  . Diabetes Brother   . Hypothyroidism Brother   . Liver disease Maternal Grandmother   . Diabetes Paternal Grandmother   . Bone cancer Maternal Aunt        and skull  . Breast cancer Sister 16  . Diabetes Sister   . Hypothyroidism Sister   . Hashimoto's thyroiditis Sister   . Breast cancer Niece 55  . Colon cancer Neg Hx   . Colon polyps Neg Hx   . Esophageal cancer Neg Hx   . Rectal cancer Neg Hx   . Stomach cancer Neg Hx     Social History   Tobacco Use  . Smoking status: Former Smoker    Years: 15.00  Types: Cigarettes    Quit date: 01/08/1996    Years since quitting: 23.4  . Smokeless tobacco: Never Used  . Tobacco comment: quit 1998 but is exposed to 2nd hand smoke  Vaping Use  . Vaping Use: Never used  Substance Use Topics  . Alcohol use: No  . Drug use: No    Home Medications Prior to Admission medications   Medication Sig Start Date End Date Taking? Authorizing Provider  allopurinol (ZYLOPRIM) 100 MG tablet TAKE 1 TABLET BY MOUTH TWICE A DAY 04/20/19   Heilingoetter, Cassandra L, PA-C  ARMOUR THYROID 90 MG tablet Take 90 mg by mouth every morning. 01/22/19   [provider]  estradiol (VIVELLE-DOT) 0.075 MG/24HR APPLY NEW PATCH TWICE WEEKLY 04/14/19   [provider]  lidocaine-prilocaine (EMLA) cream Apply 1 application topically as needed. 01/12/19   Heilingoetter, Cassandra L, PA-C  ondansetron (ZOFRAN) 8 MG tablet Take 1 tablet (8 mg total) by mouth every 8 (eight) hours as needed for nausea or vomiting.  02/03/19   Heilingoetter, Cassandra L, PA-C  predniSONE (DELTASONE) 50 MG tablet Please take 2 tablets for 5 consecutive days starting on the first day of chemotherapy every cycle. 01/20/19   Heilingoetter, Cassandra L, PA-C  prochlorperazine (COMPAZINE) 10 MG tablet TAKE 1 TABLET (10 MG TOTAL) BY MOUTH EVERY 6 (SIX) HOURS AS NEEDED FOR NAUSEA OR VOMITING. 04/04/19   Heilingoetter, Cassandra L, PA-C  progesterone (PROMETRIUM) 100 MG capsule  01/19/18   [provider]  rosuvastatin (CRESTOR) 5 MG tablet Take 1 tablet   3 x /week  (MWF)  for Cholesterol Patient not taking: Reported on 05/13/2019 03/10/19   [name], MD  prochlorperazine (COMPAZINE) 10 MG tablet Take 1 tablet (10 mg total) by mouth every 6 (six) hours as needed for nausea or vomiting. 02/03/19   Heilingoetter, Cassandra L, PA-C    Allergies    Ppd [tuberculin purified protein derivative]  Review of Systems   Review of Systems  All other systems reviewed and are negative.   Physical Exam Updated Vital Signs BP 104/80   Pulse (!) 149   Temp 97.6 F (36.4 C) (Oral)   Resp (!) 26   SpO2 93%   Physical Exam Vitals and nursing note reviewed.  Constitutional:      General: She is not in acute distress.    Appearance: She is well-developed.  HENT:     Head: Normocephalic and atraumatic.     Right Ear: External ear normal.     Left Ear: External ear normal.  Eyes:     General: No scleral icterus.       Right eye: No discharge.        Left eye: No discharge.     Conjunctiva/sclera: Conjunctivae normal.  Neck:     Trachea: No tracheal deviation.  Cardiovascular:     Rate and Rhythm: Regular rhythm. Tachycardia present.  Pulmonary:     Effort: Pulmonary effort is normal. No respiratory distress.     Breath sounds: Normal breath sounds. No stridor. No wheezing or rales.  Abdominal:     General: Bowel sounds are normal. There is no distension.     Palpations: Abdomen is soft.     Tenderness: There is  abdominal tenderness in the epigastric area. There is no guarding or rebound.  Musculoskeletal:        General: No tenderness.     Cervical back: Neck supple.  Skin:    General: Skin is warm and dry.  Findings: No rash.  Neurological:     Mental Status: She is alert.     Cranial Nerves: No cranial nerve deficit (no facial droop, extraocular movements intact, no slurred speech).     Sensory: No sensory deficit.     Motor: No abnormal muscle tone or seizure activity.     Coordination: Coordination normal.     ED Results / Procedures / Treatments   Labs (all labs ordered are listed, but only abnormal results are displayed) Labs Reviewed  COMPREHENSIVE METABOLIC PANEL - Abnormal; Notable for the following components:      Result Value   Potassium 5.7 (*)    CO2 20 (*)    Glucose, Bld 114 (*)    Creatinine, Ser 1.02 (*)    AST 68 (*)    ALT 71 (*)    GFR calc non Af Amer 58 (*)    All other components within normal limits  URINALYSIS, ROUTINE W REFLEX MICROSCOPIC - Abnormal; Notable for the following components:   APPearance CLOUDY (*)    Hgb urine dipstick SMALL (*)    Protein, ur 100 (*)    Bacteria, UA MANY (*)    All other components within normal limits  D-DIMER, QUANTITATIVE (NOT AT [facility]) - Abnormal; Notable for the following components:   D-Dimer, Quant 1.35 (*)    All other components within normal limits  TROPONIN I (HIGH SENSITIVITY) - Abnormal; Notable for the following components:   Troponin I (High Sensitivity) 71 (*)    All other components within normal limits  SARS CORONAVIRUS 2 BY RT PCR (HOSPITAL ORDER, [place] [facility])  LIPASE, BLOOD  CBC WITH DIFFERENTIAL/PLATELET  TROPONIN I (HIGH SENSITIVITY)    EKG EKG Interpretation  Date/Time:  Monday June 21 2019 12:28:38 EDT Ventricular Rate:  140 PR Interval:    QRS Duration: 97 QT Interval:  311 QTC Calculation: 475 R Axis:   35 Text Interpretation: Sinus tachycardia Probable  anterior infarct, age indeterminate 100 Lead; [place] Since last tracing rate faster Confirmed by Dorie Rank [phone]) on 06/21/2019 2:41:04 PM   Radiology CT Angio Chest PE W and/or Wo Contrast  Result Date: 06/21/2019 CLINICAL DATA:  Shortness of breath. History of mantle cell lymphoma. EXAM: CT ANGIOGRAPHY CHEST WITH CONTRAST TECHNIQUE: Multidetector CT imaging of the chest was performed using the standard protocol during bolus administration of intravenous contrast. Multiplanar CT image reconstructions and MIPs were obtained to evaluate the vascular anatomy. CONTRAST:  175mL OMNIPAQUE IOHEXOL 350 MG/ML SOLN COMPARISON:  CT scan dated 03/29/2019 and PET-CT dated 06/10/2019 FINDINGS: Cardiovascular: There is a slightly increased small pericardial effusion primarily around the ascending thoracic aorta. Heart size is normal. No pulmonary emboli. Mediastinum/Nodes: The patient is a few small bilateral axillary lymph nodes, less prominent than on the prior CT scan of 03/29/2019. No hilar or mediastinal adenopathy. Thyroid gland, trachea, and esophagus appear normal. Lungs/Pleura: The patient has new small bilateral pleural effusions, right greater than left. Slight atelectasis at the lung bases with minimal interstitial edema at the lung bases. No consolidative infiltrates. Focal area of chronic atelectasis at the medial aspect of the right middle lobe is stable. Upper Abdomen: No acute abnormality. Musculoskeletal: No chest wall abnormality. No acute or significant osseous findings. Review of the MIP images confirms the above findings. IMPRESSION: 1. No pulmonary emboli. 2. New small bilateral pleural effusions, right greater than left. 3. Slight atelectasis at the lung bases with minimal interstitial edema at the lung bases. 4. Slight  increase in the small pericardial effusion. Electronically Signed   By: Lorriane Shire M.D.   On: 06/21/2019 15:28   DG Abdomen Acute W/Chest  Result Date:  06/21/2019 CLINICAL DATA:  Diffuse abdominal pain, nausea and vomiting beginning 2 days ago. Mantle cell lymphoma. EXAM: DG ABDOMEN ACUTE W/ 1V CHEST COMPARISON:  03/29/2019 FINDINGS: Power port inserted from a right approach has its tip in the SVC just below the azygos level. Cardiac silhouette is enlarged. There are bilateral effusions in there is density in both lower lobes consistent with atelectasis. Pneumonia not excluded. No free air. Abdominal bowel gas pattern does not show ileus or obstruction. No abnormal calcifications or bone findings. Ordinary lower lumbar degenerative changes. IMPRESSION: Pleural effusions and lower lung atelectasis. No acute or significant radiographic abdominal finding. Electronically Signed   By: Nelson Chimes M.D.   On: 06/21/2019 13:36    Procedures .Critical Care Performed by: Dorie Rank, MD Authorized by: Dorie Rank, MD   Critical care provider statement:    Critical care time (minutes):  45   Critical care was time spent personally by me on the following activities:  Discussions with consultants, evaluation of patient's response to treatment, examination of patient, ordering and performing treatments and interventions, ordering and review of laboratory studies, ordering and review of radiographic studies, pulse oximetry, re-evaluation of patient's condition, obtaining history from patient or surrogate and review of old charts   (including critical care time)  Medications Ordered in ED Medications  0.9 %  sodium chloride infusion ( Intravenous Bolus from Bag 06/21/19 1355)  sodium chloride 0.9 % bolus 1,000 mL (has no administration in time range)    Followed by  0.9 %  sodium chloride infusion (has no administration in time range)  sodium chloride (PF) 0.9 % injection (has no administration in time range)  cefTRIAXone (ROCEPHIN) 1 g in sodium chloride 0.9 % 100 mL IVPB (has no administration in time range)  morphine 4 MG/ML injection 4 mg (4 mg Intravenous  Given 06/21/19 1357)  ondansetron (ZOFRAN) injection 4 mg (4 mg Intravenous Given 06/21/19 1355)  iohexol (OMNIPAQUE) 350 MG/ML injection 100 mL (100 mLs Intravenous Contrast Given 06/21/19 1508)    ED Course  I have reviewed the triage vital signs and the nursing notes.  Pertinent labs & imaging results that were available during my care of the patient were reviewed by me and considered in my medical decision making (see chart for details).  Clinical Course as of Jun 20 1649  Mon Jun 21, 2019  1441 Acute abdominal series reviewed.  No acute findings   [JK]  1441 LFTs D-dimer and troponin elevated.  Potassium also slightly increased at 5.7   [JK]  1441 Symptoms concerning for PE.  We will proceed with empiric heparin.  CT scan ordered.   [JK]    Clinical Course User Index [JK] Dorie Rank, MD   MDM Rules/Calculators/A&P                          Patient presented with complaints of shortness of breath.  She did have an elevated D-dimer as well as troponin.  Symptoms were concerning for the possibility of pulmonary embolism.  Patient was empirically started on heparin.  CT scan however does not show any signs of PE.  Heparin has been discontinued.  She has pleural effusions and atelectasis but no definite pneumonia.  Patient's urinalysis does suggest infection but she does not have a leukocytosis  and she is afebrile.  I have ordered antibiotics.  Symptoms are not suggestive of sepsis.  Patient remains persistently tachycardic.  Is possible she could have a cardiomyopathy related to her chemotherapy agents.  I will consult the medical service for admission.  I think patient would likely benefit from echocardiogram and further evaluation. Final Clinical Impression(s) / ED Diagnoses Final diagnoses:  Tachycardia  Dyspnea, unspecified type  Acute cystitis without hematuria     Dorie Rank, MD 06/21/19 1652  Case discussed with cardiology, Dr. Harrell Gave,  will consult on pt in am   Dorie Rank, MD 06/21/19 1727

## 2019-06-22 ENCOUNTER — Encounter (HOSPITAL_COMMUNITY): Payer: Self-pay | Admitting: Family Medicine

## 2019-06-22 ENCOUNTER — Observation Stay (HOSPITAL_COMMUNITY): Payer: BC Managed Care – PPO

## 2019-06-22 DIAGNOSIS — I472 Ventricular tachycardia: Secondary | ICD-10-CM | POA: Diagnosis present

## 2019-06-22 DIAGNOSIS — R7989 Other specified abnormal findings of blood chemistry: Secondary | ICD-10-CM | POA: Diagnosis not present

## 2019-06-22 DIAGNOSIS — E785 Hyperlipidemia, unspecified: Secondary | ICD-10-CM | POA: Diagnosis present

## 2019-06-22 DIAGNOSIS — N179 Acute kidney failure, unspecified: Secondary | ICD-10-CM | POA: Diagnosis not present

## 2019-06-22 DIAGNOSIS — K810 Acute cholecystitis: Secondary | ICD-10-CM | POA: Diagnosis not present

## 2019-06-22 DIAGNOSIS — Z9221 Personal history of antineoplastic chemotherapy: Secondary | ICD-10-CM | POA: Diagnosis not present

## 2019-06-22 DIAGNOSIS — E860 Dehydration: Secondary | ICD-10-CM | POA: Diagnosis present

## 2019-06-22 DIAGNOSIS — E039 Hypothyroidism, unspecified: Secondary | ICD-10-CM | POA: Diagnosis present

## 2019-06-22 DIAGNOSIS — R7303 Prediabetes: Secondary | ICD-10-CM | POA: Diagnosis present

## 2019-06-22 DIAGNOSIS — I110 Hypertensive heart disease with heart failure: Secondary | ICD-10-CM | POA: Diagnosis present

## 2019-06-22 DIAGNOSIS — N3001 Acute cystitis with hematuria: Secondary | ICD-10-CM | POA: Diagnosis present

## 2019-06-22 DIAGNOSIS — I5082 Biventricular heart failure: Secondary | ICD-10-CM | POA: Diagnosis present

## 2019-06-22 DIAGNOSIS — I5023 Acute on chronic systolic (congestive) heart failure: Secondary | ICD-10-CM | POA: Diagnosis present

## 2019-06-22 DIAGNOSIS — E876 Hypokalemia: Secondary | ICD-10-CM | POA: Diagnosis not present

## 2019-06-22 DIAGNOSIS — I5021 Acute systolic (congestive) heart failure: Secondary | ICD-10-CM

## 2019-06-22 DIAGNOSIS — R7401 Elevation of levels of liver transaminase levels: Secondary | ICD-10-CM | POA: Diagnosis not present

## 2019-06-22 DIAGNOSIS — K8020 Calculus of gallbladder without cholecystitis without obstruction: Secondary | ICD-10-CM | POA: Diagnosis not present

## 2019-06-22 DIAGNOSIS — R000 Tachycardia, unspecified: Secondary | ICD-10-CM | POA: Diagnosis present

## 2019-06-22 DIAGNOSIS — I340 Nonrheumatic mitral (valve) insufficiency: Secondary | ICD-10-CM

## 2019-06-22 DIAGNOSIS — R748 Abnormal levels of other serum enzymes: Secondary | ICD-10-CM | POA: Diagnosis present

## 2019-06-22 DIAGNOSIS — I361 Nonrheumatic tricuspid (valve) insufficiency: Secondary | ICD-10-CM | POA: Diagnosis not present

## 2019-06-22 DIAGNOSIS — I428 Other cardiomyopathies: Secondary | ICD-10-CM | POA: Diagnosis present

## 2019-06-22 DIAGNOSIS — K8000 Calculus of gallbladder with acute cholecystitis without obstruction: Secondary | ICD-10-CM | POA: Diagnosis present

## 2019-06-22 DIAGNOSIS — Z6835 Body mass index (BMI) 35.0-35.9, adult: Secondary | ICD-10-CM | POA: Diagnosis not present

## 2019-06-22 DIAGNOSIS — R0600 Dyspnea, unspecified: Secondary | ICD-10-CM | POA: Diagnosis not present

## 2019-06-22 DIAGNOSIS — E875 Hyperkalemia: Secondary | ICD-10-CM | POA: Diagnosis not present

## 2019-06-22 DIAGNOSIS — Z20822 Contact with and (suspected) exposure to covid-19: Secondary | ICD-10-CM | POA: Diagnosis present

## 2019-06-22 DIAGNOSIS — J9601 Acute respiratory failure with hypoxia: Secondary | ICD-10-CM | POA: Diagnosis not present

## 2019-06-22 DIAGNOSIS — C8310 Mantle cell lymphoma, unspecified site: Secondary | ICD-10-CM | POA: Diagnosis present

## 2019-06-22 DIAGNOSIS — R570 Cardiogenic shock: Secondary | ICD-10-CM | POA: Diagnosis present

## 2019-06-22 LAB — COMPREHENSIVE METABOLIC PANEL
ALT: 213 U/L — ABNORMAL HIGH (ref 0–44)
AST: 193 U/L — ABNORMAL HIGH (ref 15–41)
Albumin: 3.8 g/dL (ref 3.5–5.0)
Alkaline Phosphatase: 70 U/L (ref 38–126)
Anion gap: 10 (ref 5–15)
BUN: 19 mg/dL (ref 8–23)
CO2: 20 mmol/L — ABNORMAL LOW (ref 22–32)
Calcium: 8.1 mg/dL — ABNORMAL LOW (ref 8.9–10.3)
Chloride: 106 mmol/L (ref 98–111)
Creatinine, Ser: 1.06 mg/dL — ABNORMAL HIGH (ref 0.44–1.00)
GFR calc Af Amer: >60 mL/min (ref >60)
GFR calc non Af Amer: 56 mL/min — ABNORMAL LOW (ref >60)
Glucose, Bld: 130 mg/dL — ABNORMAL HIGH (ref 70–99)
Potassium: 5 mmol/L (ref 3.5–5.1)
Sodium: 136 mmol/L (ref 135–145)
Total Bilirubin: 0.8 mg/dL (ref 0.3–1.2)
Total Protein: 6.1 g/dL — ABNORMAL LOW (ref 6.5–8.1)

## 2019-06-22 LAB — CBC
HCT: 41.6 % (ref 36.0–46.0)
Hemoglobin: 12.8 g/dL (ref 12.0–15.0)
MCH: 29.1 pg (ref 26.0–34.0)
MCHC: 30.8 g/dL (ref 30.0–36.0)
MCV: 94.5 fL (ref 80.0–100.0)
Platelets: 266 10*3/uL (ref 150–400)
RBC: 4.4 MIL/uL (ref 3.87–5.11)
RDW: 14.9 % (ref 11.5–15.5)
WBC: 9.3 10*3/uL (ref 4.0–10.5)
nRBC: 0.3 % — ABNORMAL HIGH (ref 0.0–0.2)

## 2019-06-22 LAB — ECHOCARDIOGRAM COMPLETE
Height: 62 in
Weight: 3262.81 oz

## 2019-06-22 LAB — HEPATITIS PANEL, ACUTE
HCV Ab: NONREACTIVE
Hep A IgM: NONREACTIVE
Hep B C IgM: NONREACTIVE
Hepatitis B Surface Ag: NONREACTIVE

## 2019-06-22 LAB — TROPONIN I (HIGH SENSITIVITY): Troponin I (High Sensitivity): 69 ng/L — ABNORMAL HIGH (ref <18)

## 2019-06-22 MED ORDER — SODIUM CHLORIDE 0.9 % IV SOLN
1.0000 g | INTRAVENOUS | Status: DC
  Administered 2019-06-22: 1 g via INTRAVENOUS
  Filled 2019-06-22: qty 1

## 2019-06-22 MED ORDER — FUROSEMIDE 10 MG/ML IJ SOLN
40.0000 mg | Freq: Two times a day (BID) | INTRAMUSCULAR | Status: DC
  Administered 2019-06-22 – 2019-06-27 (×10): 40 mg via INTRAVENOUS
  Filled 2019-06-22 (×11): qty 4

## 2019-06-22 MED ORDER — ONDANSETRON HCL 4 MG PO TABS: 4.0000 mg | ORAL_TABLET | Freq: Four times a day (QID) | ORAL | Status: DC | PRN

## 2019-06-22 MED ORDER — ENOXAPARIN SODIUM 40 MG/0.4ML ~~LOC~~ SOLN
40.0000 mg | SUBCUTANEOUS | Status: DC
  Administered 2019-06-22 – 2019-06-24 (×3): 40 mg via SUBCUTANEOUS
  Filled 2019-06-22 (×3): qty 0.4

## 2019-06-22 MED ORDER — POLYETHYLENE GLYCOL 3350 17 G PO PACK: 17.0000 g | PACK | Freq: Every day | ORAL | Status: DC | PRN

## 2019-06-22 MED ORDER — ACETAMINOPHEN 325 MG PO TABS
650.0000 mg | ORAL_TABLET | Freq: Four times a day (QID) | ORAL | Status: DC | PRN
  Administered 2019-06-22: 650 mg via ORAL
  Filled 2019-06-22: qty 2

## 2019-06-22 MED ORDER — CHLORHEXIDINE GLUCONATE CLOTH 2 % EX PADS
6.0000 | MEDICATED_PAD | Freq: Every day | CUTANEOUS | Status: DC
  Administered 2019-06-22 – 2019-07-02 (×11): 6 via TOPICAL

## 2019-06-22 MED ORDER — ONDANSETRON HCL 4 MG/2ML IJ SOLN
4.0000 mg | Freq: Four times a day (QID) | INTRAMUSCULAR | Status: DC | PRN
  Administered 2019-06-22 (×4): 4 mg via INTRAVENOUS
  Filled 2019-06-22 (×2): qty 2

## 2019-06-22 MED ORDER — METOPROLOL TARTRATE 5 MG/5ML IV SOLN: 5.0000 mg | Freq: Four times a day (QID) | INTRAVENOUS | Status: DC | PRN

## 2019-06-22 MED ORDER — ACETAMINOPHEN 650 MG RE SUPP
650.0000 mg | Freq: Four times a day (QID) | RECTAL | Status: DC | PRN
  Filled 2019-06-22: qty 1

## 2019-06-22 MED ORDER — PROGESTERONE MICRONIZED 100 MG PO CAPS
100.0000 mg | ORAL_CAPSULE | Freq: Every day | ORAL | Status: DC
  Administered 2019-06-22 – 2019-07-01 (×10): 100 mg via ORAL
  Filled 2019-06-22 (×14): qty 1

## 2019-06-22 MED ORDER — SODIUM CHLORIDE 0.9% FLUSH
10.0000 mL | INTRAVENOUS | Status: DC | PRN
  Administered 2019-06-25 – 2019-06-26 (×3): 10 mL

## 2019-06-22 MED ORDER — PERFLUTREN LIPID MICROSPHERE
1.0000 mL | INTRAVENOUS | Status: AC | PRN
  Administered 2019-06-22: 2 mL via INTRAVENOUS
  Filled 2019-06-22: qty 10

## 2019-06-22 MED ORDER — THYROID 60 MG PO TABS
90.0000 mg | ORAL_TABLET | Freq: Every day | ORAL | Status: DC
  Administered 2019-06-23 – 2019-07-02 (×10): 90 mg via ORAL
  Filled 2019-06-22 (×12): qty 1

## 2019-06-22 MED ORDER — PIPERACILLIN-TAZOBACTAM 3.375 G IVPB
3.3750 g | Freq: Three times a day (TID) | INTRAVENOUS | Status: DC
  Administered 2019-06-22 – 2019-06-27 (×13): 3.375 g via INTRAVENOUS
  Filled 2019-06-22 (×12): qty 50

## 2019-06-22 MED ORDER — MORPHINE SULFATE (PF) 2 MG/ML IV SOLN: 2.0000 mg | INTRAVENOUS | Status: DC | PRN

## 2019-06-22 NOTE — Consult Note (Addendum)
Cardiology Consultation:   Patient ID: PRAGYA LOFASO MRN: [id number]; DOB: [birth year]  Admit date: 06/21/2019 Date of Consult: 06/22/2019  Primary Care Provider: [name], MD [facility] Cardiologist: No primary care provider on file. new [facility] Electrophysiologist:  None    Patient Profile:   CASEE KNEPP is a 64 y.o. female with a hx of palpitations thought to be PVCs, HTN, non hodgkins lymphoma last treatment 5 weeks ago, presented with SOB, abd pain and vomiting and in sinus tachycardia who is being seen today for the evaluation of tachycardia small pericardial effusion on CT of chest at the request of Dr. Lonny Prude.  History of Present Illness:   Ms. Brackin remotely 2014 saw Dr. Acie Fredrickson for palpitations thought to be PVCs but no further follow up.  + HTN, hypothyroidism, and obesity now being treated for stage IV non-hodgkin's lymphoma, mantel cell lymphoma, last treatment 5 weeks ago.  In Jan echo with normal EF She presented to ER yesterday 06/21/19 with DOE that began 2-3 days prior to admit and abd discomfort. Across upper abd.with more discomfort over RU quad.  In ER HR 140s Sinus tach  EKG:  The EKG was personally reviewed and demonstrates:  ST at 140 with Q wave in V1-3, similar to old - and no acute ST changes,  secnod EKG with ST at 134  Rare PVC and non specific T wave changes.   Telemetry:  Telemetry was personally reviewed and demonstrates:  ST with PVCs  Hs Troponin 71 & 90 Labs K+ 5.7 on admit now 5.0 TSH 5.001 Na 136, BUN 19 Cr. 1.06 AST 68 now 193 ALT 71 now 213  Hgb 12.8 plts 266 WBC 9.3   ddimer 1.35 and CTA of chest:  IMPRESSION: 1. No pulmonary emboli. 2. New small bilateral pleural effusions, right greater than left. 3. Slight atelectasis at the lung bases with minimal interstitial edema at the lung bases. 4. Slight increase in the small pericardial effusion.  Echo pending but EF 20%   Currently feeling better, has been  receiving IV fluids HR in 120s. She stated the metoprolol IV  Made her feel bad.  No awareness of rapid HR, no chest pain.   BP 107/91  P 120s afebrile   Past Medical History:  Diagnosis Date  . Allergy   . Cancer [facility])    being worked up for [place]  . Colon polyp 12/29/2006  . 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 Hyperlipidemia   . Hypothyroidism   . Low kidney function   . Obesity    BMI 34  . Osteoporosis   . Palpitations   . Vitamin D deficiency     Past Surgical History:  Procedure Laterality Date  . ABDOMINAL HYSTERECTOMY  1997  . CESAREAN SECTION  1983  . COLONOSCOPY W/ BIOPSIES  12/29/2006  . IR IMAGING GUIDED PORT INSERTION  01/15/2019  . LAPAROSCOPY     1991 for emdometriosis  . UPPER GASTROINTESTINAL ENDOSCOPY       Home Medications:  Prior to Admission medications   Medication Sig Start Date End Date Taking? Authorizing Provider  ARMOUR THYROID 90 MG tablet Take 90 mg by mouth every morning. 01/22/19  Yes [provider]  estradiol (VIVELLE-DOT) 0.075 MG/24HR Place 1 patch onto the skin 2 (two) times a week.  04/14/19  Yes [provider]  lidocaine-prilocaine (EMLA) cream Apply 1 application topically as needed. Patient taking differently: Apply 1 application topically as needed (for port access).  01/12/19  Yes  Heilingoetter, Cassandra L, PA-C  progesterone (PROMETRIUM) 100 MG capsule Take 100 mg by mouth daily.  01/19/18  Yes [provider]  allopurinol (ZYLOPRIM) 100 MG tablet TAKE 1 TABLET BY MOUTH TWICE A DAY Patient not taking: Reported on 06/21/2019 04/20/19   Heilingoetter, Cassandra L, PA-C  ondansetron (ZOFRAN) 8 MG tablet Take 1 tablet (8 mg total) by mouth every 8 (eight) hours as needed for nausea or vomiting. Patient not taking: Reported on 06/21/2019 02/03/19   Heilingoetter, Cassandra L, PA-C  predniSONE (DELTASONE) 50 MG tablet Please take 2 tablets for 5 consecutive days starting on the first day of chemotherapy every cycle. Patient  not taking: Reported on 06/21/2019 01/20/19   Heilingoetter, Cassandra L, PA-C  prochlorperazine (COMPAZINE) 10 MG tablet TAKE 1 TABLET (10 MG TOTAL) BY MOUTH EVERY 6 (SIX) HOURS AS NEEDED FOR NAUSEA OR VOMITING. Patient not taking: Reported on 06/21/2019 04/04/19   Heilingoetter, Cassandra L, PA-C  rosuvastatin (CRESTOR) 5 MG tablet Take 1 tablet   3 x /week  (MWF)  for Cholesterol Patient not taking: Reported on 05/13/2019 03/10/19   [name], MD  prochlorperazine (COMPAZINE) 10 MG tablet Take 1 tablet (10 mg total) by mouth every 6 (six) hours as needed for nausea or vomiting. 02/03/19   Heilingoetter, Cassandra L, PA-C    Inpatient Medications: Scheduled Meds: . Chlorhexidine Gluconate Cloth  6 each Topical Daily  . enoxaparin (LOVENOX) injection  40 mg Subcutaneous Q24H  . progesterone  100 mg Oral Daily  . thyroid  90 mg Oral QAC breakfast   Continuous Infusions: . sodium chloride 125 mL/hr at 06/22/19 0635  . sodium chloride 1,000 mL (06/21/19 1759)  . cefTRIAXone (ROCEPHIN)  IV     PRN Meds: acetaminophen **OR** acetaminophen, metoprolol tartrate, metoprolol tartrate, ondansetron **OR** ondansetron (ZOFRAN) IV, ondansetron (ZOFRAN) IV, polyethylene glycol, sodium chloride flush  Allergies:    Allergies  Allergen Reactions  . Ppd [Tuberculin Purified Protein Derivative]     Positive reaction.  Negative Chest Xray 06/16/12    Social History:   Social History   Socioeconomic History  . Marital status: Married    Spouse name: Not on file  . Number of children: 1  . Years of education: Not on file  . Highest education level: Not on file  Occupational History  . Occupation: Theatre manager  Tobacco Use  . Smoking status: Former Smoker    Years: 15.00    Types: Cigarettes    Quit date: 01/08/1996    Years since quitting: 23.4  . Smokeless tobacco: Never Used  . Tobacco comment: quit 1998 but is exposed to 2nd hand smoke  Vaping Use  . Vaping Use: Never used   Substance and Sexual Activity  . Alcohol use: No  . Drug use: No  . Sexual activity: Yes    Partners: Male    Birth control/protection: Post-menopausal  Other Topics Concern  . 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 Difficulty of Paying Living Expenses:   Food Insecurity:   . Worried About Charity fundraiser in the Last Year:   . Arboriculturist in the Last Year:   Transportation Needs:   . Film/video editor (Medical):   Marland Kitchen Lack of Transportation (Non-Medical):   Physical Activity: Inactive  . Days of Exercise per Week: 0 days  .  Minutes of Exercise per Session: 0 min  Stress: No Stress Concern Present  . Feeling of Stress : Only a little  Social Connections:   . Frequency of Communication with Friends and Family:   . Frequency of Social Gatherings with Friends and Family:   . Attends Religious Services:   . Active Member of Clubs or Organizations:   . Attends Archivist Meetings:   Marland Kitchen Marital Status:   Intimate Partner Violence:   . Fear of Current or Ex-Partner:   . Emotionally Abused:   Marland Kitchen Physically Abused:   . Sexually Abused:     Family History:    Family History  Problem Relation Age of Onset  . Heart disease Mother 41       chf  . Diabetes Mother   . Thyroid disease Mother   . Cancer Mother 36       female/ skin  . Hypertension Mother   . Liver disease Mother   . Hypertension Father   . Alcohol abuse Father   . Heart disease Father   . Breast cancer Maternal Aunt   . Hyperlipidemia Maternal Aunt   . Heart disease Maternal Aunt   . Hyperlipidemia Brother   . Hypertension Brother        x 2  . Diabetes Brother   . Hypothyroidism Brother   . Liver disease Maternal Grandmother   . Diabetes Paternal Grandmother   . Bone cancer Maternal Aunt        and skull  . Breast cancer Sister 68  .  Diabetes Sister   . Hypothyroidism Sister   . Hashimoto's thyroiditis Sister   . Breast cancer Niece 107  . Colon cancer Neg Hx   . Colon polyps Neg Hx   . Esophageal cancer Neg Hx   . Rectal cancer Neg Hx   . Stomach cancer Neg Hx      ROS:  Please see the history of present illness.  General:no colds or fevers, no weight changes Skin:no rashes or ulcers HEENT:no blurred vision, no congestion CV:see HPI PUL:see HPI GI:no diarrhea constipation or melena, no indigestion GU:no hematuria, no dysuria MS:no joint pain, no claudication Neuro:no syncope, no lightheadedness Endo:no diabetes, no thyroid disease  All other ROS reviewed and negative.     Physical Exam/Data:   Vitals:   06/22/19 0144 06/22/19 0302 06/22/19 0508 06/22/19 0727  BP:  101/71  (!) 107/91  Pulse:  (!) 127 (!) 123 (!) 126  Resp:  20  20  Temp:  (!) 97 F (36.1 C)  (!) 97.4 F (36.3 C)  TempSrc:      SpO2:  98%  99%  Weight: 92.5 kg     Height: 5\' 2"  (1.575 m)       Intake/Output Summary (Last 24 hours) at 06/22/2019 0820 Last data filed at 06/22/2019 0557 Gross per 24 hour  Intake 633 ml  Output --  Net 633 ml   Last 3 Weights 06/22/2019 06/14/2019 05/13/2019  Weight (lbs) 203 lb 14.8 oz 203 lb 4.8 oz 204 lb 9.6 oz  Weight (kg) 92.5 kg 92.216 kg 92.806 kg     Body mass index is 37.3 kg/m.  General:  Well nourished, well developed, in no acute distress HEENT: normal Lymph: no adenopathy Neck: no JVD sitting on side of bed Endocrine:  No thryomegaly Vascular: No carotid bruits; pedal pulses 2+ bilaterally Cardiac:  normal S1, S2; RRR; no murmur but rapid  Lungs:  clear to auscultation bilaterally,  no wheezing, rhonchi or rales  Abd: soft, nontender, no hepatomegaly  Ext: no edema Musculoskeletal:  No deformities, BUE and BLE strength normal and equal Skin: warm and dry  Neuro:  Alert and oriented X 3 MAE follows commands, no focal abnormalities noted Psych:  Normal affect    Relevant CV  Studies:  Echo 06/22/19 IMPRESSIONS    1. There is no left ventricular thrombus. Left ventricular ejection  fraction, by estimation, is 20 to 25%. The left ventricle has severely  decreased function. The left ventricle demonstrates global hypokinesis.  Indeterminate diastolic filling due to E-A  fusion.  2. Right ventricular systolic function is moderately reduced. The right  ventricular size is mildly enlarged. There is normal pulmonary artery  systolic pressure. The estimated right ventricular systolic pressure is  15.4 mmHg.  3. Left atrial size was mild to moderately dilated.  4. The pericardial effusion is circumferential. Moderate pleural effusion  in the left lateral region.  5. The mitral valve is normal in structure. Moderate to severe mitral  valve regurgitation. No evidence of mitral stenosis.  6. Tricuspid valve regurgitation is severe.  7. The aortic valve is tricuspid. Aortic valve regurgitation is not  visualized. No aortic stenosis is present.  8. The inferior vena cava is dilated in size with <50% respiratory  variability, suggesting right atrial pressure of 15 mmHg.   Comparison(s): Changes from prior study are noted. Compared to 01/22/2019,  there is a marked reduction in left ventricular systolic function,  moderate reduction in right ventricular systolic function, there is new  moderate to severe mitral insufficiency  (likely secondary/functional) and new severe tricuspid insufficiency.   FINDINGS  Left Ventricle: There is no left ventricular thrombus. Left ventricular  ejection fraction, by estimation, is 20 to 25%. The left ventricle has  severely decreased function. The left ventricle demonstrates global  hypokinesis. Definity contrast agent was  given IV to delineate the left ventricular endocardial borders. The left  ventricular internal cavity size was normal in size. There is no left  ventricular hypertrophy. Indeterminate diastolic filling  due to E-A  fusion.   Right Ventricle: The right ventricular size is mildly enlarged. No  increase in right ventricular wall thickness. Right ventricular systolic  function is moderately reduced. There is normal pulmonary artery systolic  pressure. The tricuspid regurgitant  velocity is 1.85 m/s, and with an assumed right atrial pressure of 15  mmHg, the estimated right ventricular systolic pressure is 00.8 mmHg.   Left Atrium: Left atrial size was mild to moderately dilated.   Right Atrium: Right atrial size was normal in size.   Pericardium: Trivial pericardial effusion is present. The pericardial  effusion is circumferential.   Mitral Valve: The mitral valve is normal in structure. There is mild  thickening of the mitral valve leaflet(s). Mild mitral annular  calcification. Moderate to severe mitral valve regurgitation, with  centrally-directed jet. No evidence of mitral valve  stenosis.   Tricuspid Valve: The tricuspid valve is normal in structure. Tricuspid  valve regurgitation is severe. The flow in the hepatic veins is reversed  during ventricular systole.   Aortic Valve: The aortic valve is tricuspid. Aortic valve regurgitation is  not visualized. No aortic stenosis is present.   Pulmonic Valve: The pulmonic valve was not well visualized. Pulmonic valve  regurgitation is not visualized.   Aorta: The aortic root and ascending aorta are structurally normal, with  no evidence of dilitation.   Venous: The inferior vena cava is dilated in  size with less than 50%  respiratory variability, suggesting right atrial pressure of 15 mmHg.   IAS/Shunts: No atrial level shunt detected by color flow Doppler.   Additional Comments: There is a moderate pleural effusion in the left  lateral region.   Echo 01/2019   IMPRESSIONS    1. Left ventricular ejection fraction, by visual estimation, is 55 to  60%. The left ventricle has normal function. There is mildly increased  left  ventricular hypertrophy.  2. Left ventricular diastolic parameters are consistent with Grade I  diastolic dysfunction (impaired relaxation).  3. The average left ventricular global longitudinal strain is -13.7 %.  4. The left ventricle has no regional wall motion abnormalities.  5. Global right ventricle has normal systolic function.The right  ventricular size is normal. No increase in right ventricular wall  thickness.  6. Left atrial size was normal.  7. Right atrial size was normal.  8. The mitral valve is grossly normal. No evidence of mitral valve  regurgitation.  9. The tricuspid valve is grossly normal.  10. The aortic valve is tricuspid. Aortic valve regurgitation is not  visualized.  11. The pulmonic valve was grossly normal. Pulmonic valve regurgitation is  not visualized.  12. The inferior vena cava is normal in size with greater than 50%  respiratory variability, suggesting right atrial pressure of 3 mmHg.   FINDINGS  Left Ventricle: Left ventricular ejection fraction, by visual estimation,  is 55 to 60%. The left ventricle has normal function. The average left  ventricular global longitudinal strain is -13.7 %. The left ventricle has  no regional wall motion  abnormalities. There is mildly increased left ventricular hypertrophy.  Left ventricular diastolic parameters are consistent with Grade I  diastolic dysfunction (impaired relaxation). Indeterminate filling  pressures.   Right Ventricle: The right ventricular size is normal. No increase in  right ventricular wall thickness. Global RV systolic function is has  normal systolic function.   Left Atrium: Left atrial size was normal in size.   Right Atrium: Right atrial size was normal in size   Pericardium: There is no evidence of pericardial effusion.   Mitral Valve: The mitral valve is grossly normal. No evidence of mitral  valve regurgitation.   Tricuspid Valve: The tricuspid valve is grossly normal.  Tricuspid valve  regurgitation is not demonstrated.   Aortic Valve: The aortic valve is tricuspid. Aortic valve regurgitation is  not visualized.   Pulmonic Valve: The pulmonic valve was grossly normal. Pulmonic valve  regurgitation is not visualized. Pulmonic regurgitation is not visualized.   Aorta: The aortic root and ascending aorta are structurally normal, with  no evidence of dilitation.   Venous: The inferior vena cava is normal in size with greater than 50%  respiratory variability, suggesting right atrial pressure of 3 mmHg.   IAS/Shunts: No atrial level shunt detected by color flow Doppler.      Laboratory Data:  High Sensitivity Troponin:   Recent Labs  Lab 06/21/19 1255 06/21/19 1721  TROPONINIHS 71* 90*     Chemistry Recent Labs  Lab 06/21/19 1255 06/21/19 1721 06/22/19 0449  NA 136  --  136  K 5.7* 4.9 5.0  CL 105  --  106  CO2 20*  --  20*  GLUCOSE 114*  --  130*  BUN 16  --  19  CREATININE 1.02*  --  1.06*  CALCIUM 8.9  --  8.1*  GFRNONAA 58*  --  56*  GFRAA >60  --  >  60  ANIONGAP 11  --  10    Recent Labs  Lab 06/21/19 1255 06/22/19 0449  PROT 6.7 6.1*  ALBUMIN 4.1 3.8  AST 68* 193*  ALT 71* 213*  ALKPHOS 76 70  BILITOT 0.7 0.8   Hematology Recent Labs  Lab 06/21/19 1255 06/22/19 0449  WBC 9.5 9.3  RBC 4.78 4.40  HGB 13.8 12.8  HCT 44.0 41.6  MCV 92.1 94.5  MCH 28.9 29.1  MCHC 31.4 30.8  RDW 14.8 14.9  PLT 298 266   BNPNo results for input(s): BNP, PROBNP in the last 168 hours.  DDimer  Recent Labs  Lab 06/21/19 1255  DDIMER 1.35*     Radiology/Studies:  CT Angio Chest PE W and/or Wo Contrast  Result Date: 06/21/2019 CLINICAL DATA:  Shortness of breath. History of mantle cell lymphoma. EXAM: CT ANGIOGRAPHY CHEST WITH CONTRAST TECHNIQUE: Multidetector CT imaging of the chest was performed using the standard protocol during bolus administration of intravenous contrast. Multiplanar CT image reconstructions and MIPs  were obtained to evaluate the vascular anatomy. CONTRAST:  13mL OMNIPAQUE IOHEXOL 350 MG/ML SOLN COMPARISON:  CT scan dated 03/29/2019 and PET-CT dated 06/10/2019 FINDINGS: Cardiovascular: There is a slightly increased small pericardial effusion primarily around the ascending thoracic aorta. Heart size is normal. No pulmonary emboli. Mediastinum/Nodes: The patient is a few small bilateral axillary lymph nodes, less prominent than on the prior CT scan of 03/29/2019. No hilar or mediastinal adenopathy. Thyroid gland, trachea, and esophagus appear normal. Lungs/Pleura: The patient has new small bilateral pleural effusions, right greater than left. Slight atelectasis at the lung bases with minimal interstitial edema at the lung bases. No consolidative infiltrates. Focal area of chronic atelectasis at the medial aspect of the right middle lobe is stable. Upper Abdomen: No acute abnormality. Musculoskeletal: No chest wall abnormality. No acute or significant osseous findings. Review of the MIP images confirms the above findings. IMPRESSION: 1. No pulmonary emboli. 2. New small bilateral pleural effusions, right greater than left. 3. Slight atelectasis at the lung bases with minimal interstitial edema at the lung bases. 4. Slight increase in the small pericardial effusion. Electronically Signed   By: Lorriane Shire M.D.   On: 06/21/2019 15:28   DG Abdomen Acute W/Chest  Result Date: 06/21/2019 CLINICAL DATA:  Diffuse abdominal pain, nausea and vomiting beginning 2 days ago. Mantle cell lymphoma. EXAM: DG ABDOMEN ACUTE W/ 1V CHEST COMPARISON:  03/29/2019 FINDINGS: Power port inserted from a right approach has its tip in the SVC just below the azygos level. Cardiac silhouette is enlarged. There are bilateral effusions in there is density in both lower lobes consistent with atelectasis. Pneumonia not excluded. No free air. Abdominal bowel gas pattern does not show ileus or obstruction. No abnormal calcifications or bone  findings. Ordinary lower lumbar degenerative changes. IMPRESSION: Pleural effusions and lower lung atelectasis. No acute or significant radiographic abdominal finding. Electronically Signed   By: Nelson Chimes M.D.   On: 06/21/2019 13:36        NO CHEST PAIN   Assessment and Plan:   1. Sinus Tach in 120s improved after IV lopressor 5 mg but this made her feel bad.  Has lopressor 5 mg IV prn.  Elevated troponin could be related to tachycardia monitor - + FH of CAD.  Would decrease fluids at this point.   2. NEW Cardiomyopathy EF 20-25% and RV function is moderately reduced.  Also with RV systolic function moderately reduced.  She does not seem volume overloaded.  Though rt heart failure is concern. Most likely from chemo.  May need Rt and Lt cardiac cath - Dr. Margaretann Loveless to see.  Begin ACE vs Entresto, BB if not RH failure, and spironolactone.  3. NEW Moderate to severe mitral regurg. Most likely related to drop in EF.  TR is severe.  4. Small pericardial effusion on CT of chest, Trivial on echo today.   5. DOE with tachycardia at baseline, small pl effusions but with new cardiomyopathy from Jan and new TR and MR.  6. Abd pain, with elevated LFTs abd film normal and CT of chest with no abnormalities noted. May be due to Rt heart failure as well.  abd ultrasound to eval?   7. Stage IV non-Hodgkin's lymphoma, mantle cell lymphoma  Therapy with R-CHOP with Neulasta support  8. Hematuria in urine and many bacteria- ? Infection stone driving other symptoms. Per IM  9. Elevated ddimer with neg PE on CTA of chest  10. HLD on crestor       For questions or updates, please contact [place] Please consult [email or web address] for contact info under    Signed, Cecilie Kicks, NP  06/22/2019 8:20 AM  Patient seen and examined with Cecilie Kicks NP.  Agree as above, with the following exceptions and changes as noted below.  Mrs. Collazos is a pleasant 64 year old female with a recent history of mantle cell  lymphoma recently completed systemic chemotherapy with R-CHOP and Neulasta support, status post 6 cycles.  She had a normal ejection fraction prior to chemotherapy, but now presents with shortness of breath and abdominal pain, noted to have an ejection fraction of approximately 20%.  We discussed etiologies of heart failure including likely role of chemotherapy, but other etiologies including nonischemic cardiomyopathy, coronary artery disease leading to ischemic cardiomyopathy.  She has prediabetes, hyperlipidemia, no hypertension noted, and brief remote smoking history.  Gen: NAD, CV: Tachycardic, no murmurs, Lungs: Diminished in the bases, Abd: soft, Extrem: Warm, well perfused, trace pedal edema, Neuro/Psych: alert and oriented x 3, normal mood and affect. All available labs, radiology testing, previous records reviewed.   Recommendations: - stop IV fluids.  - Start lasix 40 mg IV BID, patient is lasix naive.  - will assess volume status and renal function tomorrow and consider cautious initiation of entresto in next 1-2 days. - Tachycardia likely driven by reduced biventricular function. Will consider addition of metoprolol succinate or carvedilol in coming days. Would not aggressively treat tachycardia as it is likely a hemodynamic compensatory mechanism.  - notify primary oncologist of admission for heart failure.  - consider R/LHC when compensated.    Elouise Munroe, MD 06/22/19 1:48 PM

## 2019-06-22 NOTE — Progress Notes (Signed)
PT Cancellation Note  Patient Details Name: Natalie Baker MRN: [id number] DOB: 1955-02-01   Cancelled Treatment:    Reason Eval/Treat Not Completed: Patient at procedure or test/unavailable (Will check back as scheduld allows.)   Doreatha Massed, PT Acute Rehabilitation  Office: [phone] Pager: [phone]

## 2019-06-22 NOTE — Evaluation (Signed)
Physical Therapy Evaluation Patient Details Name: Natalie Baker MRN: [id number] DOB: 07/15/1955 Today's Date: 06/22/2019   History of Present Illness  64 yo female admitted with sinus tachycardia, DOE, nausea. Hx of stage IV NHL, obesity  Clinical Impression  On eval, pt was Min guard assist for mobility. She walked a short distance around her room. She is somewhat unsteady. Ambulation distance is limited by dyspnea, fatigue. HR up to 135 bpm during session. O2 was 96% on RA with activity however replaced [place] O2 at end of session for comfort. Daughter was present during session. She stated pt has been able to mobilize without assistance but that she fatigues and become dyspneic with minimal activity. Will plan to follow and progress activity as tolerated.     Follow Up Recommendations Home health PT vs No PT follow up (depending on progress);Supervision - Intermittent     Equipment Recommendations  None recommended by PT    Recommendations for Other Services       Precautions / Restrictions Precautions Precautions: Fall Precaution Comments: monitor O2, HR      Mobility  Bed Mobility               General bed mobility comments: sitting EOB with daughter in room  Transfers Overall transfer level: Needs assistance   Transfers: Sit to/from Stand Sit to Stand: Supervision         General transfer comment: for safety  Ambulation/Gait Ambulation/Gait assistance: Min guard Gait Distance (Feet): 20 Feet Assistive device: None Gait Pattern/deviations: Step-through pattern;Decreased stride length     General Gait Details: somewhat unsteady. fatigues fairly easily. O2 96% o RA, HR 135 bpm, dyspnea 2/4  Stairs            Wheelchair Mobility    Modified Rankin (Stroke Patients Only)       Balance Overall balance assessment: Mild deficits observed, not formally tested                                           Pertinent Vitals/Pain Pain  Assessment: Faces Faces Pain Scale: Hurts little more Pain Location: abdomen Pain Descriptors / Indicators: Discomfort;Sore Pain Intervention(s): Limited activity within patient's tolerance;Monitored during session    [place] expects to be discharged to:: Private residence Living Arrangements: Spouse/significant other Available Help at Discharge: Family;Available PRN/intermittently Type of Home: House         Home Equipment: None      Prior Function Level of Independence: Independent               Hand Dominance        Extremity/Trunk Assessment   Upper Extremity Assessment Upper Extremity Assessment: Overall WFL for tasks assessed    Lower Extremity Assessment Lower Extremity Assessment: Generalized weakness    Cervical / Trunk Assessment Cervical / Trunk Assessment: Normal  Communication   Communication: No difficulties  Cognition Arousal/Alertness: Awake/alert Behavior During Therapy: WFL for tasks assessed/performed Overall Cognitive Status: Within Functional Limits for tasks assessed                                        General Comments      Exercises     Assessment/Plan    PT Assessment Patient needs continued PT services  PT Problem List  Decreased strength;Decreased mobility;Decreased balance;Decreased activity tolerance       PT Treatment Interventions Gait training;Therapeutic exercise;Therapeutic activities;Patient/family education;Balance training;Functional mobility training    PT Goals (Current goals can be found in the Care Plan section)  Acute Rehab PT Goals Patient Stated Goal: to feel better. PT Goal Formulation: With patient/family Time For Goal Achievement: 07/06/19 Potential to Achieve Goals: Good    Frequency Min 3X/week   Barriers to discharge        Co-evaluation               AM-PAC PT "6 Clicks" Mobility  Outcome Measure Help needed turning from your back to your side  while in a flat bed without using bedrails?: None Help needed moving from lying on your back to sitting on the side of a flat bed without using bedrails?: None Help needed moving to and from a bed to a chair (including a wheelchair)?: A Little Help needed standing up from a chair using your arms (e.g., wheelchair or bedside chair)?: A Little Help needed to walk in hospital room?: A Little Help needed climbing 3-5 steps with a railing? : A Little 6 Click Score: 20    End of Session   Activity Tolerance: Patient limited by fatigue Patient left: in bed;with call bell/phone within reach;with family/visitor present   PT Visit Diagnosis: Unsteadiness on feet (R26.81);Muscle weakness (generalized) (M62.81)    Time: 0258-5277 PT Time Calculation (min) (ACUTE ONLY): 11 min   Charges:   PT Evaluation $PT Eval Low Complexity: [place], PT Acute Rehabilitation  Office: [phone] Pager: [phone]

## 2019-06-22 NOTE — Progress Notes (Signed)
  Echocardiogram 2D Echocardiogram has been performed.  Natalie Baker 06/22/2019, 10:22 AM

## 2019-06-22 NOTE — Progress Notes (Signed)
Pt with manual BP of 90/70, HR 124. On call cardiology called about administration of scheduled 40 IV Lasix. Verbal order given to administer, hold only if systolic less than 80. Will continue to monitor patient.

## 2019-06-22 NOTE — Plan of Care (Signed)
  Problem: Consults Goal: [facility]OL[facility]CENT PATIENT EDUCATION Description: Description: See Patient Education module for education specifics. Outcome: Progressing   

## 2019-06-22 NOTE — Progress Notes (Signed)
Patient arrived as new admit from ER via stretcher. Patient had bouts of nausea & vomiting noted. Given ice chips and made comfortable on her bed. Patient able to make her needs known. Oriented to Unit safety protocol. Call bell,personal belongings within reach and bed in the lowest position. Patient verbalized understanding. No further question as of this time.

## 2019-06-22 NOTE — Progress Notes (Signed)
Pharmacy Antibiotic Note  Natalie Baker is a 64 y.o. female admitted on 06/21/2019 with IAI.  Pharmacy has been consulted for Zosyn dosing.  Plan: Zosyn 3.375g IV q8h (4 hour infusion).  Will sign off  Height: 5\' 2"  (157.5 cm) Weight: 92.5 kg (203 lb 14.8 oz) IBW/kg (Calculated) : 50.1  Temp (24hrs), Avg:97.5 F (36.4 C), Min:97 F (36.1 C), Max:98.1 F (36.7 C)  Recent Labs  Lab 06/21/19 1255 06/22/19 0449  WBC 9.5 9.3  CREATININE 1.02* 1.06*    Estimated Creatinine Clearance: 57.5 mL/min (A) (by C-G formula based on SCr of 1.06 mg/dL (H)).    Allergies  Allergen Reactions  . Ppd [Tuberculin Purified Protein Derivative]     Positive reaction.  Negative Chest Xray 06/16/12      Thank you for allowing pharmacy to be a part of this patient's care.  Kara Mead 06/22/2019 6:20 PM

## 2019-06-22 NOTE — Progress Notes (Addendum)
PROGRESS NOTE    Natalie Baker  EXN:[id number] DOB: December 20, 1955 DOA: 06/21/2019 PCP: [name], MD   Brief Narrative: Natalie Baker is a 64 y.o. female with medical history significant of stage IV non-hodgkin's lymphoma, mantel cell lymphoma, essential hypertension, hypothyroidism, obesity. Patient presented secondary to dyspnea on exertion with evidence of sinus tachycardia. Admitted for persistent tachycardia.   Assessment & Plan:   Active Problems:   Hyperlipidemia   Hypothyroidism   Morbid obesity ([place]) BMI 35+ with prediabetes, hyperlipidemia, htn   Prediabetes   Mantle cell lymphoma (HCC)   Sinus tachycardia   Sinus tachycardia CTA chest negative for PE. No obvious source for tachycardia. Patient is on Amour for hypothyroidism with no recent change in therapy. Patient reports having a TSH checked about 2 weeks prior but does not know the results. Patient given heparin drip for initial concern of PE. -Check TSH -Transthoracic Echocardiogram -Telemetry -Cardiology recommendations -Metoprolol 5 mg IV prn HR >130 bpm  Dyspnea on exertion Related to tachycardia, most likely. CTA chest unremarkable for PE. Small pleural effusions noted. On room air at rest. -PT eval  Abdominal pain Associated elevated AST/ALT. CTA chest visualized most of the upper abdomen with no abnormalities noted. -Repeat CMP in AM -Tylenol prn  Elevated troponin -Obtain delta troponin -Telemetry -Transthoracic Echocardiogram  Hypothyroidism Patient is on Amour 90 mg as an outpatient -Continue Amour 90 mg -TSH as mentioned above  Elevated creatinine Mildly elevated. Baseline creatinine of 0.82. Likely secondary to mild dehydration. -IV fluids -BMP in AM  Abnormal UA Patient has no UTI symptoms. It may be possible tachycardia is secondary to infection. Started on empiric Ceftriaxone -Continue Ceftriaxone -Follow-up urine culture  Hyperkalemia Mildly elevated at  5.7. -Repeat potassium  History of Mantle Cell Lymphoma Currently in observation. Completed chemotherapy just over one month ago. Patient follows with Dr. Julien Nordmann.  Prediabetes Currently diet controlled. Hemoglobin A1C of 5.7% in 2020. -Carb modified diet  Hormone deficiency s/p bilateral hysterectomy with bilateral oophorectomy Patient is on estradiol and progesterone as an outpatient -Continue progesterone  Obesity Body mass index is 37.3 kg/m.   Addendum: Transthoracic Echocardiogram significant for reduced EF HF/cardiomyopathy. RUQ [place] significant for acute cholecystitis. Started Zosyn (discontinue Ceftriaxone). Discussed with General Surgery who will see patient tonight or tomorrow morning; anticipating management with percutaneous drain secondary to newly diagnosed cardiomyopathy.   DVT prophylaxis: Lovenox Code Status:   Code Status: Full Code Family Communication: Daughter at bedside Disposition Plan: Discharge pending continued workup for dyspnea on exertion, tachycardia, elevated LFTs   Consultants:   Cardiology  Procedures:   TRANSTHORACIC ECHOCARDIOGRAM (06/22/2019)  Antimicrobials:  None    Subjective: Still with dyspnea on exertion and tachycardia. Some epigastric and RUQ pain.  Objective: Vitals:   06/22/19 0144 06/22/19 0302 06/22/19 0508 06/22/19 0727  BP:  101/71  (!) 107/91  Pulse:  (!) 127 (!) 123 (!) 126  Resp:  20  20  Temp:  (!) 97 F (36.1 C)  (!) 97.4 F (36.3 C)  TempSrc:      SpO2:  98%  99%  Weight: 92.5 kg     Height: 5\' 2"  (1.575 m)       Intake/Output Summary (Last 24 hours) at 06/22/2019 0835 Last data filed at 06/22/2019 0557 Gross per 24 hour  Intake 633 ml  Output --  Net 633 ml   Filed Weights   06/22/19 0144  Weight: 92.5 kg    Examination:  General exam: Appears calm and comfortable Respiratory  system: Clear to auscultation. Respiratory effort normal. Cardiovascular system: S1 & S2 heard, RRR. No murmurs,  rubs, gallops or clicks. Gastrointestinal system: Abdomen is nondistended, soft and tender in RUQ. No organomegaly or masses felt. Normal bowel sounds heard. Central nervous system: Alert and oriented. No focal neurological deficits. Musculoskeletal: No edema. No calf tenderness Skin: No cyanosis. No rashes Psychiatry: Judgement and insight appear normal. Mood & affect appropriate.     Data Reviewed: I have personally reviewed following labs and imaging studies  CBC Lab Results  Component Value Date   WBC 9.3 06/22/2019   RBC 4.40 06/22/2019   HGB 12.8 06/22/2019   HCT 41.6 06/22/2019   MCV 94.5 06/22/2019   MCH 29.1 06/22/2019   PLT 266 06/22/2019   MCHC 30.8 06/22/2019   RDW 14.9 06/22/2019   LYMPHSABS 1.0 06/21/2019   MONOABS 1.0 06/21/2019   EOSABS 0.0 06/21/2019   BASOSABS 0.1 57/84/6962     Last metabolic panel Lab Results  Component Value Date   NA 136 06/22/2019   K 5.0 06/22/2019   CL 106 06/22/2019   CO2 20 (L) 06/22/2019   BUN 19 06/22/2019   CREATININE 1.06 (H) 06/22/2019   GLUCOSE 130 (H) 06/22/2019   GFRNONAA 56 (L) 06/22/2019   GFRAA >60 06/22/2019   CALCIUM 8.1 (L) 06/22/2019   PROT 6.1 (L) 06/22/2019   ALBUMIN 3.8 06/22/2019   BILITOT 0.8 06/22/2019   ALKPHOS 70 06/22/2019   AST 193 (H) 06/22/2019   ALT 213 (H) 06/22/2019   ANIONGAP 10 06/22/2019    CBG (last 3)  No results for input(s): GLUCAP in the last 72 hours.   GFR: Estimated Creatinine Clearance: 57.5 mL/min (A) (by C-G formula based on SCr of 1.06 mg/dL (H)).  Coagulation Profile: No results for input(s): INR, PROTIME in the last 168 hours.  Recent Results (from the past 240 hour(s))  SARS Coronavirus 2 by RT PCR (hospital order, performed in [facility] hospital lab) Nasopharyngeal Nasopharyngeal Swab     Status: None   Collection Time: 06/21/19  5:21 PM   Specimen: Nasopharyngeal Swab  Result Value Ref Range Status   SARS Coronavirus 2 NEGATIVE NEGATIVE Final    Comment:  (NOTE) SARS-CoV-2 target nucleic acids are NOT DETECTED.  The SARS-CoV-2 RNA is generally detectable in upper and lower respiratory specimens during the acute phase of infection. The lowest concentration of SARS-CoV-2 viral copies this assay can detect is 250 copies / mL. A negative result does not preclude SARS-CoV-2 infection and should not be used as the sole basis for treatment or other patient management decisions.  A negative result may occur with improper specimen collection / handling, submission of specimen other than nasopharyngeal swab, presence of viral mutation(s) within the areas targeted by this assay, and inadequate number of viral copies (<250 copies / mL). A negative result must be combined with clinical observations, patient history, and epidemiological information.  Fact Sheet for Patients:   StrictlyIdeas.no  Fact Sheet for Healthcare Providers: BankingDealers.co.za  This test is not yet approved or  cleared by the [place] FDA and has been authorized for detection and/or diagnosis of SARS-CoV-2 by FDA under an Emergency Use Authorization (EUA).  This EUA will remain in effect (meaning this test can be used) for the duration of the COVID-19 declaration under Section 564(b)(1) of the Act, 21 U.S.C. section 360bbb-3(b)(1), unless the authorization is terminated or revoked sooner.  Performed at [facility], [place] [address] [place], [place] [address]  Radiology Studies: CT Angio Chest PE W and/or Wo Contrast  Result Date: 06/21/2019 CLINICAL DATA:  Shortness of breath. History of mantle cell lymphoma. EXAM: CT ANGIOGRAPHY CHEST WITH CONTRAST TECHNIQUE: Multidetector CT imaging of the chest was performed using the standard protocol during bolus administration of intravenous contrast. Multiplanar CT image reconstructions and MIPs were obtained to evaluate the vascular anatomy.  CONTRAST:  131mL OMNIPAQUE IOHEXOL 350 MG/ML SOLN COMPARISON:  CT scan dated 03/29/2019 and PET-CT dated 06/10/2019 FINDINGS: Cardiovascular: There is a slightly increased small pericardial effusion primarily around the ascending thoracic aorta. Heart size is normal. No pulmonary emboli. Mediastinum/Nodes: The patient is a few small bilateral axillary lymph nodes, less prominent than on the prior CT scan of 03/29/2019. No hilar or mediastinal adenopathy. Thyroid gland, trachea, and esophagus appear normal. Lungs/Pleura: The patient has new small bilateral pleural effusions, right greater than left. Slight atelectasis at the lung bases with minimal interstitial edema at the lung bases. No consolidative infiltrates. Focal area of chronic atelectasis at the medial aspect of the right middle lobe is stable. Upper Abdomen: No acute abnormality. Musculoskeletal: No chest wall abnormality. No acute or significant osseous findings. Review of the MIP images confirms the above findings. IMPRESSION: 1. No pulmonary emboli. 2. New small bilateral pleural effusions, right greater than left. 3. Slight atelectasis at the lung bases with minimal interstitial edema at the lung bases. 4. Slight increase in the small pericardial effusion. Electronically Signed   By: Lorriane Shire M.D.   On: 06/21/2019 15:28   DG Abdomen Acute W/Chest  Result Date: 06/21/2019 CLINICAL DATA:  Diffuse abdominal pain, nausea and vomiting beginning 2 days ago. Mantle cell lymphoma. EXAM: DG ABDOMEN ACUTE W/ 1V CHEST COMPARISON:  03/29/2019 FINDINGS: Power port inserted from a right approach has its tip in the SVC just below the azygos level. Cardiac silhouette is enlarged. There are bilateral effusions in there is density in both lower lobes consistent with atelectasis. Pneumonia not excluded. No free air. Abdominal bowel gas pattern does not show ileus or obstruction. No abnormal calcifications or bone findings. Ordinary lower lumbar degenerative  changes. IMPRESSION: Pleural effusions and lower lung atelectasis. No acute or significant radiographic abdominal finding. Electronically Signed   By: Nelson Chimes M.D.   On: 06/21/2019 13:36        Scheduled Meds: . Chlorhexidine Gluconate Cloth  6 each Topical Daily  . enoxaparin (LOVENOX) injection  40 mg Subcutaneous Q24H  . progesterone  100 mg Oral Daily  . thyroid  90 mg Oral QAC breakfast   Continuous Infusions: . sodium chloride 125 mL/hr at 06/22/19 0635  . sodium chloride 1,000 mL (06/21/19 1759)  . cefTRIAXone (ROCEPHIN)  IV       LOS: 0 days     Cordelia Poche, MD [facility] 06/22/2019, 8:35 AM  If 7PM-7AM, please contact night-coverage [email or web address]

## 2019-06-23 DIAGNOSIS — K810 Acute cholecystitis: Secondary | ICD-10-CM

## 2019-06-23 DIAGNOSIS — I5020 Unspecified systolic (congestive) heart failure: Secondary | ICD-10-CM

## 2019-06-23 LAB — CBC
HCT: 37.8 % (ref 36.0–46.0)
Hemoglobin: 11.7 g/dL — ABNORMAL LOW (ref 12.0–15.0)
MCH: 28.6 pg (ref 26.0–34.0)
MCHC: 31 g/dL (ref 30.0–36.0)
MCV: 92.4 fL (ref 80.0–100.0)
Platelets: 208 10*3/uL (ref 150–400)
RBC: 4.09 MIL/uL (ref 3.87–5.11)
RDW: 15.1 % (ref 11.5–15.5)
WBC: 8 10*3/uL (ref 4.0–10.5)
nRBC: 0.6 % — ABNORMAL HIGH (ref 0.0–0.2)

## 2019-06-23 LAB — COMPREHENSIVE METABOLIC PANEL
ALT: 320 U/L — ABNORMAL HIGH (ref 0–44)
AST: 293 U/L — ABNORMAL HIGH (ref 15–41)
Albumin: 3.4 g/dL — ABNORMAL LOW (ref 3.5–5.0)
Alkaline Phosphatase: 64 U/L (ref 38–126)
Anion gap: 10 (ref 5–15)
BUN: 18 mg/dL (ref 8–23)
CO2: 20 mmol/L — ABNORMAL LOW (ref 22–32)
Calcium: 8 mg/dL — ABNORMAL LOW (ref 8.9–10.3)
Chloride: 107 mmol/L (ref 98–111)
Creatinine, Ser: 0.98 mg/dL (ref 0.44–1.00)
GFR calc Af Amer: >60 mL/min (ref >60)
GFR calc non Af Amer: >60 mL/min (ref >60)
Glucose, Bld: 89 mg/dL (ref 70–99)
Potassium: 3.7 mmol/L (ref 3.5–5.1)
Sodium: 137 mmol/L (ref 135–145)
Total Bilirubin: 0.6 mg/dL (ref 0.3–1.2)
Total Protein: 5.5 g/dL — ABNORMAL LOW (ref 6.5–8.1)

## 2019-06-23 LAB — BRAIN NATRIURETIC PEPTIDE: B Natriuretic Peptide: 1170.4 pg/mL — ABNORMAL HIGH (ref 0.0–100.0)

## 2019-06-23 LAB — MAGNESIUM: Magnesium: 2 mg/dL (ref 1.7–2.4)

## 2019-06-23 MED ORDER — CARVEDILOL 3.125 MG PO TABS
3.1250 mg | ORAL_TABLET | Freq: Two times a day (BID) | ORAL | Status: DC
  Administered 2019-06-23 – 2019-06-24 (×2): 3.125 mg via ORAL
  Filled 2019-06-23 (×2): qty 1

## 2019-06-23 NOTE — Consult Note (Signed)
[facility] Surgery Consult Note  Natalie Baker 1955-11-06  [id number].    Requesting MD: Melven Sartorius. Chief Complaint: Short of breath with chest heaviness, abdominal pain with vomiting Reason for Consult: Cholelithiasis with acute cholecystitis  HPI:  Patient is a 64 year old female with stage IV non-Hodgkin's lymphoma, mantle cell lymphoma, essential hypertension, hypothyroidism, and obesity.  She presented with a 2 to 3-day history of progressive exertional dyspnea along with abdominal pain.  She has been having episodes of nausea but no vomiting on admission.  She reports intermittent episodes of abdominal pain for some months prior to this admission.  She says the episodes come on after meals, pain can come later, frequently they wake her up, and last for hours, then resolve on their own.  Work-up in the ED shows she is afebrile but tachycardic.  Labs show a potassium of 5.0, glucose 130, creatinine 1.06, AST 193, ALT 213, troponins 90>> 69; BNP 1170; hepatitis panel is negative; chest CT negative for PE; echocardiogram shows EF 20 to 25% with severe decreased function left ventricular showing global hypokinesis.  Abdominal ultrasound 6/15: Stones in the gallbladder measuring up to 1.5 cm: There is pericholecystic fluid gallbladder wall thickened at 0.4 cm, common bile duct 0.3 cm.  Compatible with cholelithiasis and acute cholecystitis.  ROS: Review of Systems  Constitutional: Negative.   HENT: Negative.   Eyes: Negative.   Respiratory: Positive for shortness of breath.   Cardiovascular: Positive for orthopnea and leg swelling.  Gastrointestinal: Positive for abdominal pain (RUQ), nausea and vomiting. Negative for heartburn.  Genitourinary: Negative.   Musculoskeletal: Negative.   Skin: Negative.   Neurological: Negative.   Endo/Heme/Allergies: Negative.   Psychiatric/Behavioral: The patient is nervous/anxious (post chemo).     Family History  Problem Relation  Age of Onset  . Heart disease Mother 20       chf  . Diabetes Mother   . Thyroid disease Mother   . Cancer Mother 21       female/ skin  . Hypertension Mother   . Liver disease Mother   . Hypertension Father   . Alcohol abuse Father   . Heart disease Father   . Breast cancer Maternal Aunt   . Hyperlipidemia Maternal Aunt   . Heart disease Maternal Aunt   . Hyperlipidemia Brother   . Hypertension Brother        x 2  . Diabetes Brother   . Hypothyroidism Brother   . Liver disease Maternal Grandmother   . Diabetes Paternal Grandmother   . Bone cancer Maternal Aunt        and skull  . Breast cancer Sister 49  . Diabetes Sister   . Hypothyroidism Sister   . Hashimoto's thyroiditis Sister   . Breast cancer Niece 3  . Colon cancer Neg Hx   . Colon polyps Neg Hx   . Esophageal cancer Neg Hx   . Rectal cancer Neg Hx   . Stomach cancer Neg Hx     Past Medical History:  Diagnosis Date  . Allergy   . Cancer [facility]    being worked up for [place]  . Colon polyp 12/29/2006  . 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 Hyperlipidemia   . Hypothyroidism   . Low kidney function   . Obesity    BMI 34  . Osteoporosis   . Palpitations   . Vitamin D deficiency     Past Surgical History:  Procedure Laterality Date  . ABDOMINAL HYSTERECTOMY  1997  .  CESAREAN SECTION  1983  . COLONOSCOPY W/ BIOPSIES  12/29/2006  . IR IMAGING GUIDED PORT INSERTION  01/15/2019  . LAPAROSCOPY     1991 for emdometriosis  . UPPER GASTROINTESTINAL ENDOSCOPY      Social History:  reports that she quit smoking about 23 years ago. Her smoking use included cigarettes. She quit after 15.00 years of use. She has never used smokeless tobacco. She reports that she does not drink alcohol and does not use drugs.  Allergies:  Allergies  Allergen Reactions  . Ppd [Tuberculin Purified Protein Derivative]     Positive reaction.  Negative Chest Xray 06/16/12    Medications Prior to Admission  Medication Sig Dispense  Refill  . ARMOUR THYROID 90 MG tablet Take 90 mg by mouth every morning.    Marland Kitchen estradiol (VIVELLE-DOT) 0.075 MG/24HR Place 1 patch onto the skin 2 (two) times a week.     . lidocaine-prilocaine (EMLA) cream Apply 1 application topically as needed. (Patient taking differently: Apply 1 application topically as needed (for port access). ) 30 g 0  . progesterone (PROMETRIUM) 100 MG capsule Take 100 mg by mouth daily.     Marland Kitchen allopurinol (ZYLOPRIM) 100 MG tablet TAKE 1 TABLET BY MOUTH TWICE A DAY (Patient not taking: Reported on 06/21/2019) 180 tablet 1  . ondansetron (ZOFRAN) 8 MG tablet Take 1 tablet (8 mg total) by mouth every 8 (eight) hours as needed for nausea or vomiting. (Patient not taking: Reported on 06/21/2019) 30 tablet 1  . predniSONE (DELTASONE) 50 MG tablet Please take 2 tablets for 5 consecutive days starting on the first day of chemotherapy every cycle. (Patient not taking: Reported on 06/21/2019) 40 tablet 2  . prochlorperazine (COMPAZINE) 10 MG tablet TAKE 1 TABLET (10 MG TOTAL) BY MOUTH EVERY 6 (SIX) HOURS AS NEEDED FOR NAUSEA OR VOMITING. (Patient not taking: Reported on 06/21/2019) 30 tablet 3  . rosuvastatin (CRESTOR) 5 MG tablet Take 1 tablet   3 x /week  (MWF)  for Cholesterol (Patient not taking: Reported on 05/13/2019) 36 tablet 1    Blood pressure 111/84, pulse (!) 122, temperature 97.6 F (36.4 C), temperature source Oral, resp. rate 20, height 5\' 2"  (1.575 m), weight 96.3 kg, SpO2 98 %. Physical Exam:  General: pleasant, WD, WN white female who is sitting up in bed in NAD HEENT: head is normocephalic, atraumatic.  Sclera are noninjected.  PERRL.  Ears and nose without any masses or lesions.  Mouth is pink and moist Heart: regular, rate, and rhythm.  Normal s1,s2. No obvious murmurs, gallops, or rubs noted.  Palpable radial and pedal pulses bilaterally Lungs: CTAB, no wheezes, rhonchi, or rales noted.  Respiratory effort nonlabored Abd: soft, Tender to palpation RUQ, ND, +BS, no  masses, hernias, or organomegaly MS: all 4 extremities are symmetrical with no cyanosis, clubbing, or edema. Skin: warm and dry with no masses, lesions, or rashes Neuro: Cranial nerves 2-12 grossly intact, sensation is normal throughout Psych: A&Ox3 with an appropriate affect.   Results for orders placed or performed during the hospital encounter of 06/21/19 (from the past 48 hour(s))  Comprehensive metabolic panel     Status: Abnormal   Collection Time: 06/21/19 12:55 PM  Result Value Ref Range   Sodium 136 135 - 145 mmol/L   Potassium 5.7 (H) 3.5 - 5.1 mmol/L   Chloride 105 98 - 111 mmol/L   CO2 20 (L) 22 - 32 mmol/L   Glucose, Bld 114 (H) 70 - 99 mg/dL  Comment: Glucose reference range applies only to samples taken after fasting for at least 8 hours.   BUN 16 8 - 23 mg/dL   Creatinine, Ser 1.02 (H) 0.44 - 1.00 mg/dL   Calcium 8.9 8.9 - 10.3 mg/dL   Total Protein 6.7 6.5 - 8.1 g/dL   Albumin 4.1 3.5 - 5.0 g/dL   AST 68 (H) 15 - 41 U/L   ALT 71 (H) 0 - 44 U/L   Alkaline Phosphatase 76 38 - 126 U/L   Total Bilirubin 0.7 0.3 - 1.2 mg/dL   GFR calc non Af Amer 58 (L) >60 mL/min   GFR calc Af Amer >60 >60 mL/min   Anion gap 11 5 - 15    Comment: Performed at [name], [place] [address], [place], [place] [address]  Lipase, blood     Status: None   Collection Time: 06/21/19 12:55 PM  Result Value Ref Range   Lipase 21 11 - 51 U/L    Comment: Performed at [facility], [place] [address], [place], [place] [address]  CBC with Diff     Status: None   Collection Time: 06/21/19 12:55 PM  Result Value Ref Range   WBC 9.5 4.0 - 10.5 K/uL   RBC 4.78 3.87 - 5.11 MIL/uL   Hemoglobin 13.8 12.0 - 15.0 g/dL   HCT 44.0 36 - 46 %   MCV 92.1 80.0 - 100.0 fL   MCH 28.9 26.0 - 34.0 pg   MCHC 31.4 30.0 - 36.0 g/dL   RDW 14.8 11.5 - 15.5 %   Platelets 298 150 - 400 K/uL   nRBC 0.0 0.0 - 0.2 %   Neutrophils Relative % 76 %   Neutro Abs 7.4 1.7 - 7.7 K/uL    Lymphocytes Relative 11 %   Lymphs Abs 1.0 0.7 - 4.0 K/uL   Monocytes Relative 11 %   Monocytes Absolute 1.0 0 - 1 K/uL   Eosinophils Relative 0 %   Eosinophils Absolute 0.0 0 - 0 K/uL   Basophils Relative 1 %   Basophils Absolute 0.1 0 - 0 K/uL   Immature Granulocytes 1 %   Abs Immature Granulocytes 0.05 0.00 - 0.07 K/uL    Comment: Performed at [facility], [place] [address] [place], [place] [address]  D-dimer, quantitative (not at [facility])     Status: Abnormal   Collection Time: 06/21/19 12:55 PM  Result Value Ref Range   D-Dimer, Quant 1.35 (H) 0.00 - 0.50 ug/mL-FEU    Comment: (NOTE) At the manufacturer cut-off of 0.50 ug/mL FEU, this assay has been documented to exclude PE with a sensitivity and negative predictive value of 97 to 99%.  At this time, this assay has not been approved by the FDA to exclude DVT/VTE. Results should be correlated with clinical presentation. Performed at [facility], [place] [address] [place], [place] [address]   Troponin I (High Sensitivity)     Status: Abnormal   Collection Time: 06/21/19 12:55 PM  Result Value Ref Range   Troponin I (High Sensitivity) 71 (H) <18 ng/L    Comment: (NOTE) Elevated high sensitivity troponin I (hsTnI) values and significant  changes across serial measurements may suggest ACS but many other  chronic and acute conditions are known to elevate hsTnI results.  Refer to the "Links" section for chest pain algorithms and additional  guidance. Performed at [facility], [place] [address], [place], [place] [address]   TSH  Status: Abnormal   Collection Time: 06/21/19 12:55 PM  Result Value Ref Range   TSH 5.001 (H) 0.350 - 4.500 uIU/mL    Comment: Performed by a 3rd Generation assay with a functional sensitivity of <=0.01 uIU/mL. Performed at [facility], [place] [address], [place], [place] [address]   Urinalysis, Routine w reflex microscopic      Status: Abnormal   Collection Time: 06/21/19  2:25 PM  Result Value Ref Range   Color, Urine YELLOW YELLOW   APPearance CLOUDY (A) CLEAR   Specific Gravity, Urine 1.023 1.005 - 1.030   pH 5.0 5.0 - 8.0   Glucose, UA NEGATIVE NEGATIVE mg/dL   Hgb urine dipstick SMALL (A) NEGATIVE   Bilirubin Urine NEGATIVE NEGATIVE   Ketones, ur NEGATIVE NEGATIVE mg/dL   Protein, ur 100 (A) NEGATIVE mg/dL   Nitrite NEGATIVE NEGATIVE   Leukocytes,Ua NEGATIVE NEGATIVE   RBC / HPF 0-5 0 - 5 RBC/hpf   WBC, UA 21-50 0 - 5 WBC/hpf   Bacteria, UA MANY (A) NONE SEEN   Squamous Epithelial / LPF 0-5 0 - 5   Mucus PRESENT     Comment: Performed at [facility], [place] [address], [place], [place] [address]  Troponin I (High Sensitivity)     Status: Abnormal   Collection Time: 06/21/19  5:21 PM  Result Value Ref Range   Troponin I (High Sensitivity) 90 (H) <18 ng/L    Comment: (NOTE) Elevated high sensitivity troponin I (hsTnI) values and significant  changes across serial measurements may suggest ACS but many other  chronic and acute conditions are known to elevate hsTnI results.  Refer to the "Links" section for chest pain algorithms and additional  guidance. Performed at [facility], [place] [address], [place], [place] [address]   SARS Coronavirus 2 by RT PCR (hospital order, performed in [facility] hospital lab) Nasopharyngeal Nasopharyngeal Swab     Status: None   Collection Time: 06/21/19  5:21 PM   Specimen: Nasopharyngeal Swab  Result Value Ref Range   SARS Coronavirus 2 NEGATIVE NEGATIVE    Comment: (NOTE) SARS-CoV-2 target nucleic acids are NOT DETECTED.  The SARS-CoV-2 RNA is generally detectable in upper and lower respiratory specimens during the acute phase of infection. The lowest concentration of SARS-CoV-2 viral copies this assay can detect is 250 copies / mL. A negative result does not preclude SARS-CoV-2 infection and should not be used as the sole  basis for treatment or other patient management decisions.  A negative result may occur with improper specimen collection / handling, submission of specimen other than nasopharyngeal swab, presence of viral mutation(s) within the areas targeted by this assay, and inadequate number of viral copies (<250 copies / mL). A negative result must be combined with clinical observations, patient history, and epidemiological information.  Fact Sheet for Patients:   StrictlyIdeas.no  Fact Sheet for Healthcare Providers: BankingDealers.co.za  This test is not yet approved or  cleared by the [place] FDA and has been authorized for detection and/or diagnosis of SARS-CoV-2 by FDA under an Emergency Use Authorization (EUA).  This EUA will remain in effect (meaning this test can be used) for the duration of the COVID-19 declaration under Section 564(b)(1) of the Act, 21 U.S.C. section 360bbb-3(b)(1), unless the authorization is terminated or revoked sooner.  Performed at [facility], [place] [address], [place], [place] [address]   Potassium     Status: None   Collection Time: 06/21/19  5:21 PM  Result Value Ref Range   Potassium 4.9 3.5 - 5.1 mmol/L    Comment: Performed at [facility], [place] [address], [place], [place] [address]  Comprehensive metabolic panel     Status: Abnormal   Collection Time: 06/22/19  4:49 AM  Result Value Ref Range   Sodium 136 135 - 145 mmol/L   Potassium 5.0 3.5 - 5.1 mmol/L   Chloride 106 98 - 111 mmol/L   CO2 20 (L) 22 - 32 mmol/L   Glucose, Bld 130 (H) 70 - 99 mg/dL    Comment: Glucose reference range applies only to samples taken after fasting for at least 8 hours.   BUN 19 8 - 23 mg/dL   Creatinine, Ser 1.06 (H) 0.44 - 1.00 mg/dL   Calcium 8.1 (L) 8.9 - 10.3 mg/dL   Total Protein 6.1 (L) 6.5 - 8.1 g/dL   Albumin 3.8 3.5 - 5.0 g/dL   AST 193 (H) 15 - 41 U/L   ALT 213 (H)  0 - 44 U/L   Alkaline Phosphatase 70 38 - 126 U/L   Total Bilirubin 0.8 0.3 - 1.2 mg/dL   GFR calc non Af Amer 56 (L) >60 mL/min   GFR calc Af Amer >60 >60 mL/min   Anion gap 10 5 - 15    Comment: Performed at [facility], [place] [address], [place], [place] [address]  CBC     Status: Abnormal   Collection Time: 06/22/19  4:49 AM  Result Value Ref Range   WBC 9.3 4.0 - 10.5 K/uL   RBC 4.40 3.87 - 5.11 MIL/uL   Hemoglobin 12.8 12.0 - 15.0 g/dL   HCT 41.6 36 - 46 %   MCV 94.5 80.0 - 100.0 fL   MCH 29.1 26.0 - 34.0 pg   MCHC 30.8 30.0 - 36.0 g/dL   RDW 14.9 11.5 - 15.5 %   Platelets 266 150 - 400 K/uL   nRBC 0.3 (H) 0.0 - 0.2 %    Comment: Performed at [facility], [place] [address] [place], [place] [address]  Hepatitis panel, acute     Status: None   Collection Time: 06/22/19 11:16 AM  Result Value Ref Range   Hepatitis B Surface Ag NON REACTIVE NON REACTIVE   HCV Ab NON REACTIVE NON REACTIVE    Comment: (NOTE) Nonreactive HCV antibody screen is consistent with no HCV infections,  unless recent infection is suspected or other evidence exists to indicate HCV infection.     Hep A IgM NON REACTIVE NON REACTIVE   Hep B C IgM NON REACTIVE NON REACTIVE    Comment: Performed at [facility], [place] [address] [place], [place] [address]  Troponin I (High Sensitivity)     Status: Abnormal   Collection Time: 06/22/19 11:16 AM  Result Value Ref Range   Troponin I (High Sensitivity) 69 (H) <18 ng/L    Comment: DELTA CHECK NOTED (NOTE) Elevated high sensitivity troponin I (hsTnI) values and significant  changes across serial measurements may suggest ACS but many other  chronic and acute conditions are known to elevate hsTnI results.  Refer to the Links section for chest pain algorithms and additional  guidance. Performed at [facility], [place] [address] [place], [place] [address]   Comprehensive metabolic panel     Status:  Abnormal   Collection Time: 06/23/19  3:32 AM  Result Value Ref Range   Sodium 137 135 -  145 mmol/L   Potassium 3.7 3.5 - 5.1 mmol/L    Comment: DELTA CHECK NOTED   Chloride 107 98 - 111 mmol/L   CO2 20 (L) 22 - 32 mmol/L   Glucose, Bld 89 70 - 99 mg/dL    Comment: Glucose reference range applies only to samples taken after fasting for at least 8 hours.   BUN 18 8 - 23 mg/dL   Creatinine, Ser 0.98 0.44 - 1.00 mg/dL   Calcium 8.0 (L) 8.9 - 10.3 mg/dL   Total Protein 5.5 (L) 6.5 - 8.1 g/dL   Albumin 3.4 (L) 3.5 - 5.0 g/dL   AST 293 (H) 15 - 41 U/L   ALT 320 (H) 0 - 44 U/L   Alkaline Phosphatase 64 38 - 126 U/L   Total Bilirubin 0.6 0.3 - 1.2 mg/dL   GFR calc non Af Amer >60 >60 mL/min   GFR calc Af Amer >60 >60 mL/min   Anion gap 10 5 - 15    Comment: Performed at [facility], [place] [address], [place], [place] [address]  CBC     Status: Abnormal   Collection Time: 06/23/19  3:32 AM  Result Value Ref Range   WBC 8.0 4.0 - 10.5 K/uL   RBC 4.09 3.87 - 5.11 MIL/uL   Hemoglobin 11.7 (L) 12.0 - 15.0 g/dL   HCT 37.8 36 - 46 %   MCV 92.4 80.0 - 100.0 fL   MCH 28.6 26.0 - 34.0 pg   MCHC 31.0 30.0 - 36.0 g/dL   RDW 15.1 11.5 - 15.5 %   Platelets 208 150 - 400 K/uL   nRBC 0.6 (H) 0.0 - 0.2 %    Comment: Performed at [facility], [place] [address] [place], [place] [address]  Magnesium     Status: None   Collection Time: 06/23/19  3:32 AM  Result Value Ref Range   Magnesium 2.0 1.7 - 2.4 mg/dL    Comment: Performed at [facility], [place] [address], [place], [place] [address]  Brain natriuretic peptide     Status: Abnormal   Collection Time: 06/23/19  3:32 AM  Result Value Ref Range   B Natriuretic Peptide 1,170.4 (H) 0.0 - 100.0 pg/mL    Comment: Performed at [facility], [place] [address], [place], [place] [address]   CT Angio Chest PE W and/or Wo Contrast  Result Date: 06/21/2019 CLINICAL DATA:  Shortness of  breath. History of mantle cell lymphoma. EXAM: CT ANGIOGRAPHY CHEST WITH CONTRAST TECHNIQUE: Multidetector CT imaging of the chest was performed using the standard protocol during bolus administration of intravenous contrast. Multiplanar CT image reconstructions and MIPs were obtained to evaluate the vascular anatomy. CONTRAST:  156mL OMNIPAQUE IOHEXOL 350 MG/ML SOLN COMPARISON:  CT scan dated 03/29/2019 and PET-CT dated 06/10/2019 FINDINGS: Cardiovascular: There is a slightly increased small pericardial effusion primarily around the ascending thoracic aorta. Heart size is normal. No pulmonary emboli. Mediastinum/Nodes: The patient is a few small bilateral axillary lymph nodes, less prominent than on the prior CT scan of 03/29/2019. No hilar or mediastinal adenopathy. Thyroid gland, trachea, and esophagus appear normal. Lungs/Pleura: The patient has new small bilateral pleural effusions, right greater than left. Slight atelectasis at the lung bases with minimal interstitial edema at the lung bases. No consolidative infiltrates. Focal area of chronic atelectasis at the medial aspect of the right middle lobe is stable. Upper Abdomen: No acute abnormality. Musculoskeletal: No chest wall abnormality. No acute  or significant osseous findings. Review of the MIP images confirms the above findings. IMPRESSION: 1. No pulmonary emboli. 2. New small bilateral pleural effusions, right greater than left. 3. Slight atelectasis at the lung bases with minimal interstitial edema at the lung bases. 4. Slight increase in the small pericardial effusion. Electronically Signed   By: Lorriane Shire M.D.   On: 06/21/2019 15:28   DG Abdomen Acute W/Chest  Result Date: 06/21/2019 CLINICAL DATA:  Diffuse abdominal pain, nausea and vomiting beginning 2 days ago. Mantle cell lymphoma. EXAM: DG ABDOMEN ACUTE W/ 1V CHEST COMPARISON:  03/29/2019 FINDINGS: Power port inserted from a right approach has its tip in the SVC just below the azygos  level. Cardiac silhouette is enlarged. There are bilateral effusions in there is density in both lower lobes consistent with atelectasis. Pneumonia not excluded. No free air. Abdominal bowel gas pattern does not show ileus or obstruction. No abnormal calcifications or bone findings. Ordinary lower lumbar degenerative changes. IMPRESSION: Pleural effusions and lower lung atelectasis. No acute or significant radiographic abdominal finding. Electronically Signed   By: Nelson Chimes M.D.   On: 06/21/2019 13:36   ECHOCARDIOGRAM COMPLETE  Result Date: 06/22/2019    ECHOCARDIOGRAM REPORT   Patient Name:   MONIA TIMMERS Date of Exam: 06/22/2019 Medical Rec #:  [id number]        Height:       62.0 in Accession #:    [id number]       Weight:       203.9 lb Date of Birth:  30-Oct-1955       BSA:          1.928 m Patient Age:    80 years         BP:           107/91 mmHg Patient Gender: F                HR:           128 bpm. Exam Location:  Inpatient Procedure: 2D Echo, Cardiac Doppler, Color Doppler and Intracardiac            Opacification Agent Indications:    Elevated Troponin  History:        Patient has prior history of Echocardiogram examinations, most                 recent 01/22/2019. Risk Factors:Dyslipidemia. Cancer.                 Hypothyroidism.  Sonographer:    Jonelle Sidle Dance Referring Phys: [place]  1. There is no left ventricular thrombus. Left ventricular ejection fraction, by estimation, is 20 to 25%. The left ventricle has severely decreased function. The left ventricle demonstrates global hypokinesis. Indeterminate diastolic filling due to E-A  fusion.  2. Right ventricular systolic function is moderately reduced. The right ventricular size is mildly enlarged. There is normal pulmonary artery systolic pressure. The estimated right ventricular systolic pressure is 78.9 mmHg.  3. Left atrial size was mild to moderately dilated.  4. The pericardial effusion is circumferential.  Moderate pleural effusion in the left lateral region.  5. The mitral valve is normal in structure. Moderate to severe mitral valve regurgitation. No evidence of mitral stenosis.  6. Tricuspid valve regurgitation is severe.  7. The aortic valve is tricuspid. Aortic valve regurgitation is not visualized. No aortic stenosis is present.  8. The inferior vena cava is dilated in size with <50%  respiratory variability, suggesting right atrial pressure of 15 mmHg. Comparison(s): Changes from prior study are noted. Compared to 01/22/2019, there is a marked reduction in left ventricular systolic function, moderate reduction in right ventricular systolic function, there is new moderate to severe mitral insufficiency (likely secondary/functional) and new severe tricuspid insufficiency. FINDINGS  Left Ventricle: There is no left ventricular thrombus. Left ventricular ejection fraction, by estimation, is 20 to 25%. The left ventricle has severely decreased function. The left ventricle demonstrates global hypokinesis. Definity contrast agent was given IV to delineate the left ventricular endocardial borders. The left ventricular internal cavity size was normal in size. There is no left ventricular hypertrophy. Indeterminate diastolic filling due to E-A fusion. Right Ventricle: The right ventricular size is mildly enlarged. No increase in right ventricular wall thickness. Right ventricular systolic function is moderately reduced. There is normal pulmonary artery systolic pressure. The tricuspid regurgitant velocity is 1.85 m/s, and with an assumed right atrial pressure of 15 mmHg, the estimated right ventricular systolic pressure is 96.7 mmHg. Left Atrium: Left atrial size was mild to moderately dilated. Right Atrium: Right atrial size was normal in size. Pericardium: Trivial pericardial effusion is present. The pericardial effusion is circumferential. Mitral Valve: The mitral valve is normal in structure. There is mild thickening  of the mitral valve leaflet(s). Mild mitral annular calcification. Moderate to severe mitral valve regurgitation, with centrally-directed jet. No evidence of mitral valve stenosis. Tricuspid Valve: The tricuspid valve is normal in structure. Tricuspid valve regurgitation is severe. The flow in the hepatic veins is reversed during ventricular systole. Aortic Valve: The aortic valve is tricuspid. Aortic valve regurgitation is not visualized. No aortic stenosis is present. Pulmonic Valve: The pulmonic valve was not well visualized. Pulmonic valve regurgitation is not visualized. Aorta: The aortic root and ascending aorta are structurally normal, with no evidence of dilitation. Venous: The inferior vena cava is dilated in size with less than 50% respiratory variability, suggesting right atrial pressure of 15 mmHg. IAS/Shunts: No atrial level shunt detected by color flow Doppler. Additional Comments: There is a moderate pleural effusion in the left lateral region.  LEFT VENTRICLE PLAX 2D LVIDd:         5.10 cm LVIDs:         4.20 cm LV PW:         1.10 cm LV IVS:        1.00 cm LVOT diam:     1.80 cm LV SV:         18 LV SV Index:   9 LVOT Area:     2.54 cm  RIGHT VENTRICLE            IVC RV Basal diam:  2.90 cm    IVC diam: 1.80 cm RV S prime:     5.22 cm/s TAPSE (M-mode): 1.4 cm LEFT ATRIUM             Index       RIGHT ATRIUM           Index LA diam:        4.30 cm 2.23 cm/m  RA Area:     14.30 cm LA Vol (A2C):   77.2 ml 40.05 ml/m RA Volume:   30.90 ml  16.03 ml/m LA Vol (A4C):   31.0 ml 16.08 ml/m LA Biplane Vol: 48.8 ml 25.32 ml/m  AORTIC VALVE LVOT Vmax:   57.30 cm/s LVOT Vmean:  32.700 cm/s LVOT VTI:    0.072 m  AORTA Ao  Root diam: 3.10 cm Ao Asc diam:  3.20 cm MITRAL VALVE                TRICUSPID VALVE MV Area (PHT): 5.07 cm     TR Peak grad:   13.6 mmHg MV Decel Time: 150 msec     TR Vmax:        184.60 cm/s MV E velocity: 122.00 cm/s                             SHUNTS                              Systemic VTI:  0.07 m                             Systemic Diam: 1.80 cm Sanda Klein MD Electronically signed by Sanda Klein MD Signature Date/Time: 06/22/2019/10:45:23 AM    Final    US Abdomen Limited RUQ  Result Date: 06/22/2019 CLINICAL DATA:  Elevated liver function tests. EXAM: ULTRASOUND ABDOMEN LIMITED RIGHT UPPER QUADRANT COMPARISON:  CT chest 06/21/2019. CT chest, abdomen and pelvis 03/01/2019. FINDINGS: Gallbladder: Stones are seen in the gallbladder measuring up to 1.5 cm. There is pericholecystic fluid. The gallbladder wall is thickened at 0.4 cm. Common bile duct: Diameter: 0.3 cm Liver: No focal lesion identified. Within normal limits in parenchymal echogenicity. Portal vein is patent on color Doppler imaging with normal direction of blood flow towards the liver. Other: Small right pleural effusion is noted. IMPRESSION: Gallstones with pericholecystic fluid and wall thickening compatible with acute cholecystitis. Small appearing right pleural effusion. Electronically Signed   By: Inge Rise M.D.   On: 06/22/2019 15:23  Echocardiogram 6/15: 1. There is no left ventricular thrombus. Left ventricular ejection fraction, by estimation, is 20 to 25%. The left ventricle has severely decreased function. The left ventricle demonstrates global hypokinesis. Indeterminate diastolic filling due to E-A fusion. 2. Right ventricular systolic function is moderately reduced. The right ventricular size is mildly enlarged. There is normal pulmonary artery systolic pressure. The estimated right ventricular systolic pressure is 94.8 mmHg. 3. Left atrial size was mild to moderately dilated. 4. The pericardial effusion is circumferential. Moderate pleural effusion in the left lateral region. 5. The mitral valve is normal in structure. Moderate to severe mitral valve regurgitation. No evidence of mitral stenosis. 6. Tricuspid valve regurgitation is severe. 7. The aortic valve is tricuspid. Aortic  valve regurgitation is not visualized. No aortic stenosis is present. 8. The inferior vena cava is dilated in size with <50% respiratory variability, sugg   . piperacillin-tazobactam (ZOSYN)  IV 3.375 g (06/23/19 0438)   Assessment/Plan Stage IV non-Hodgkin's lymphoma Mantle cell lymphoma  - 6 cycles of chemotherapy with R-CHOP completed.  No findings of disease progression  06/14/2019 Dr. Curt Bears Cardiomyopathy EF 20 to 25%   - plan Stann [place], BB and spironolactone; ?Cardiac cath; Hx tobacco use Hypothyroid Prediabetes Sinus tachycardia Chest CTA negative for PE AST/ALT elevation  - AST/ALT 193/213 >>293/320 Dehydration/mild renal insufficiency -improving Possible UTI  Cholecystitis/cholelithiasis  FEN: Heart healthy carb modified diet ID: Rocephin x2 6/15; Zosyn 6/15 >> day 2 DVT: can have DVT chemical prophylaxis from our standpoint/SCD Follow up:  TBD  Plan: Under normal circumstances would recommend laparoscopic cholecystectomy this admission.  Right now she is tender on palpation in the right  upper quadrant, but tolerating a carb modified low-fat diet.  With her new cardiomyopathy, and elevated BNP we could consider just antibiotic therapy and see if her biliary symptoms improved.  If they did not improve significantly with just antibiotic therapy we could consider an IR drain, with interval cholecystectomy at a later time.  Will review with Dr. Ninfa Linden and also taken to consideration cardiology recommendations.  Earnstine Regal [facility] 06/23/2019, 10:23 AM Please see Amion for pager number during day hours 7:00am-4:30pm

## 2019-06-23 NOTE — Progress Notes (Addendum)
RN notified by [employer] having runs of Vtach and SVT. Patient VS assessed, not in any form of distress, no chest pain per pt. Went to bathroom, went back to bed. MD made aware, EKG 12 Lead ordered and done. EKG read as Sinus tachycardia with occasional premature ventricular complexes, Low voltage QRS, cannot rule out Anteroseptal infarct. (result did not crossover). Spoke to to Dr. Jerilynn Mages. Natalie Baker.  No orders made.  Will continue to monitor pt.

## 2019-06-23 NOTE — TOC Progression Note (Signed)
Transition of Care [facility]) - Progression Note    Patient Details  Name: Natalie Baker MRN: [id number] Date of Birth: Jul 18, 1955  Transition of Care [facility] CM/SW Contact  Makailey Hodgkin, Juliann Pulse, RN Phone Number: 06/23/2019, 3:40 PM  Clinical Narrative: Attempted checking w/several HHC agencies no one able to accept-[facility].      Expected Discharge Plan: [place] Barriers to Discharge: No [place] will accept this patient  Expected Discharge Plan and Services Expected Discharge Plan: [place]   Discharge Planning Services: CM Consult   Living arrangements for the past 2 months: Single Family Home                                Social Determinants of Health (SDOH) Interventions    Readmission Risk Interventions No flowsheet data found.

## 2019-06-23 NOTE — Progress Notes (Addendum)
Progress Note  Patient Name: Natalie Baker Date of Encounter: 06/23/2019  [facility] Cardiologist: Elouise Munroe, MD   Subjective   No chest pain and no SOB in bed and with ambulation breathing is improved.   Inpatient Medications    Scheduled Meds: . Chlorhexidine Gluconate Cloth  6 each Topical Daily  . enoxaparin (LOVENOX) injection  40 mg Subcutaneous Q24H  . furosemide  40 mg Intravenous BID  . progesterone  100 mg Oral Daily  . thyroid  90 mg Oral QAC breakfast   Continuous Infusions: . piperacillin-tazobactam (ZOSYN)  IV 3.375 g (06/23/19 0438)   PRN Meds: acetaminophen **OR** acetaminophen, morphine injection, ondansetron **OR** ondansetron (ZOFRAN) IV, ondansetron (ZOFRAN) IV, polyethylene glycol, sodium chloride flush   Vital Signs    Vitals:   06/23/19 0145 06/23/19 0148 06/23/19 0508 06/23/19 0523  BP: 104/88 95/80 108/82   Pulse: (!) 118  (!) 118   Resp: 14  20   Temp: 97.8 F (36.6 C)  97.7 F (36.5 C)   TempSrc: Oral  Oral   SpO2: 99%  100%   Weight:    96.3 kg  Height:        Intake/Output Summary (Last 24 hours) at 06/23/2019 0810 Last data filed at 06/23/2019 0523 Gross per 24 hour  Intake 390 ml  Output 600 ml  Net -210 ml   Last 3 Weights 06/23/2019 06/22/2019 06/14/2019  Weight (lbs) 212 lb 3.2 oz 203 lb 14.8 oz 203 lb 4.8 oz  Weight (kg) 96.253 kg 92.5 kg 92.216 kg      Telemetry    ST with NSVT  - Personally Reviewed  ECG    At 0239  06/23/19  ST at 120 non specific ST abnormality  - Personally Reviewed  Physical Exam   GEN: No acute distress.   Neck: No JVD Cardiac: RRR, no murmurs, rubs, or gallops.  Respiratory: Clear to auscultation bilaterally. GI: Soft, nontender, non-distended  MS: No edema; No deformity. Neuro:  Nonfocal  Psych: Normal affect   Labs    High Sensitivity Troponin:   Recent Labs  Lab 06/21/19 1255 06/21/19 1721 06/22/19 1116  TROPONINIHS 71* 90* 69*      Chemistry Recent Labs  Lab  06/21/19 1255 06/21/19 1255 06/21/19 1721 06/22/19 0449 06/23/19 0332  NA 136  --   --  136 137  K 5.7*   < > 4.9 5.0 3.7  CL 105  --   --  106 107  CO2 20*  --   --  20* 20*  GLUCOSE 114*  --   --  130* 89  BUN 16  --   --  19 18  CREATININE 1.02*  --   --  1.06* 0.98  CALCIUM 8.9  --   --  8.1* 8.0*  PROT 6.7  --   --  6.1* 5.5*  ALBUMIN 4.1  --   --  3.8 3.4*  AST 68*  --   --  193* 293*  ALT 71*  --   --  213* 320*  ALKPHOS 76  --   --  70 64  BILITOT 0.7  --   --  0.8 0.6  GFRNONAA 58*  --   --  56* >60  GFRAA >60  --   --  >60 >60  ANIONGAP 11  --   --  10 10   < > = values in this interval not displayed.     Hematology Recent Labs  Lab 06/21/19 1255 06/22/19 0449 06/23/19 0332  WBC 9.5 9.3 8.0  RBC 4.78 4.40 4.09  HGB 13.8 12.8 11.7*  HCT 44.0 41.6 37.8  MCV 92.1 94.5 92.4  MCH 28.9 29.1 28.6  MCHC 31.4 30.8 31.0  RDW 14.8 14.9 15.1  PLT 298 266 208    BNPNo results for input(s): BNP, PROBNP in the last 168 hours.   DDimer  Recent Labs  Lab 06/21/19 1255  DDIMER 1.35*     Radiology    CT Angio Chest PE W and/or Wo Contrast  Result Date: 06/21/2019 CLINICAL DATA:  Shortness of breath. History of mantle cell lymphoma. EXAM: CT ANGIOGRAPHY CHEST WITH CONTRAST TECHNIQUE: Multidetector CT imaging of the chest was performed using the standard protocol during bolus administration of intravenous contrast. Multiplanar CT image reconstructions and MIPs were obtained to evaluate the vascular anatomy. CONTRAST:  120mL OMNIPAQUE IOHEXOL 350 MG/ML SOLN COMPARISON:  CT scan dated 03/29/2019 and PET-CT dated 06/10/2019 FINDINGS: Cardiovascular: There is a slightly increased small pericardial effusion primarily around the ascending thoracic aorta. Heart size is normal. No pulmonary emboli. Mediastinum/Nodes: The patient is a few small bilateral axillary lymph nodes, less prominent than on the prior CT scan of 03/29/2019. No hilar or mediastinal adenopathy. Thyroid gland,  trachea, and esophagus appear normal. Lungs/Pleura: The patient has new small bilateral pleural effusions, right greater than left. Slight atelectasis at the lung bases with minimal interstitial edema at the lung bases. No consolidative infiltrates. Focal area of chronic atelectasis at the medial aspect of the right middle lobe is stable. Upper Abdomen: No acute abnormality. Musculoskeletal: No chest wall abnormality. No acute or significant osseous findings. Review of the MIP images confirms the above findings. IMPRESSION: 1. No pulmonary emboli. 2. New small bilateral pleural effusions, right greater than left. 3. Slight atelectasis at the lung bases with minimal interstitial edema at the lung bases. 4. Slight increase in the small pericardial effusion. Electronically Signed   By: Lorriane Shire M.D.   On: 06/21/2019 15:28   DG Abdomen Acute W/Chest  Result Date: 06/21/2019 CLINICAL DATA:  Diffuse abdominal pain, nausea and vomiting beginning 2 days ago. Mantle cell lymphoma. EXAM: DG ABDOMEN ACUTE W/ 1V CHEST COMPARISON:  03/29/2019 FINDINGS: Power port inserted from a right approach has its tip in the SVC just below the azygos level. Cardiac silhouette is enlarged. There are bilateral effusions in there is density in both lower lobes consistent with atelectasis. Pneumonia not excluded. No free air. Abdominal bowel gas pattern does not show ileus or obstruction. No abnormal calcifications or bone findings. Ordinary lower lumbar degenerative changes. IMPRESSION: Pleural effusions and lower lung atelectasis. No acute or significant radiographic abdominal finding. Electronically Signed   By: Nelson Chimes M.D.   On: 06/21/2019 13:36   ECHOCARDIOGRAM COMPLETE  Result Date: 06/22/2019    ECHOCARDIOGRAM REPORT   Patient Name:   Natalie Baker Date of Exam: 06/22/2019 Medical Rec #:  [id number]        Height:       62.0 in Accession #:    [id number]       Weight:       203.9 lb Date of Birth:  [birth year]        BSA:          1.928 m Patient Age:    64 years         BP:           107/91 mmHg Patient Gender: F  HR:           128 bpm. Exam Location:  Inpatient Procedure: 2D Echo, Cardiac Doppler, Color Doppler and Intracardiac            Opacification Agent Indications:    Elevated Troponin  History:        Patient has prior history of Echocardiogram examinations, most                 recent 01/22/2019. Risk Factors:Dyslipidemia. Cancer.                 Hypothyroidism.  Sonographer:    Jonelle Sidle Dance Referring Phys: [place]  1. There is no left ventricular thrombus. Left ventricular ejection fraction, by estimation, is 20 to 25%. The left ventricle has severely decreased function. The left ventricle demonstrates global hypokinesis. Indeterminate diastolic filling due to E-A  fusion.  2. Right ventricular systolic function is moderately reduced. The right ventricular size is mildly enlarged. There is normal pulmonary artery systolic pressure. The estimated right ventricular systolic pressure is 48.5 mmHg.  3. Left atrial size was mild to moderately dilated.  4. The pericardial effusion is circumferential. Moderate pleural effusion in the left lateral region.  5. The mitral valve is normal in structure. Moderate to severe mitral valve regurgitation. No evidence of mitral stenosis.  6. Tricuspid valve regurgitation is severe.  7. The aortic valve is tricuspid. Aortic valve regurgitation is not visualized. No aortic stenosis is present.  8. The inferior vena cava is dilated in size with <50% respiratory variability, suggesting right atrial pressure of 15 mmHg. Comparison(s): Changes from prior study are noted. Compared to 01/22/2019, there is a marked reduction in left ventricular systolic function, moderate reduction in right ventricular systolic function, there is new moderate to severe mitral insufficiency (likely secondary/functional) and new severe tricuspid insufficiency. FINDINGS  Left  Ventricle: There is no left ventricular thrombus. Left ventricular ejection fraction, by estimation, is 20 to 25%. The left ventricle has severely decreased function. The left ventricle demonstrates global hypokinesis. Definity contrast agent was given IV to delineate the left ventricular endocardial borders. The left ventricular internal cavity size was normal in size. There is no left ventricular hypertrophy. Indeterminate diastolic filling due to E-A fusion. Right Ventricle: The right ventricular size is mildly enlarged. No increase in right ventricular wall thickness. Right ventricular systolic function is moderately reduced. There is normal pulmonary artery systolic pressure. The tricuspid regurgitant velocity is 1.85 m/s, and with an assumed right atrial pressure of 15 mmHg, the estimated right ventricular systolic pressure is 46.2 mmHg. Left Atrium: Left atrial size was mild to moderately dilated. Right Atrium: Right atrial size was normal in size. Pericardium: Trivial pericardial effusion is present. The pericardial effusion is circumferential. Mitral Valve: The mitral valve is normal in structure. There is mild thickening of the mitral valve leaflet(s). Mild mitral annular calcification. Moderate to severe mitral valve regurgitation, with centrally-directed jet. No evidence of mitral valve stenosis. Tricuspid Valve: The tricuspid valve is normal in structure. Tricuspid valve regurgitation is severe. The flow in the hepatic veins is reversed during ventricular systole. Aortic Valve: The aortic valve is tricuspid. Aortic valve regurgitation is not visualized. No aortic stenosis is present. Pulmonic Valve: The pulmonic valve was not well visualized. Pulmonic valve regurgitation is not visualized. Aorta: The aortic root and ascending aorta are structurally normal, with no evidence of dilitation. Venous: The inferior vena cava is dilated in size with less than 50% respiratory variability, suggesting  right atrial  pressure of 15 mmHg. IAS/Shunts: No atrial level shunt detected by color flow Doppler. Additional Comments: There is a moderate pleural effusion in the left lateral region.  LEFT VENTRICLE PLAX 2D LVIDd:         5.10 cm LVIDs:         4.20 cm LV PW:         1.10 cm LV IVS:        1.00 cm LVOT diam:     1.80 cm LV SV:         18 LV SV Index:   9 LVOT Area:     2.54 cm  RIGHT VENTRICLE            IVC RV Basal diam:  2.90 cm    IVC diam: 1.80 cm RV S prime:     5.22 cm/s TAPSE (M-mode): 1.4 cm LEFT ATRIUM             Index       RIGHT ATRIUM           Index LA diam:        4.30 cm 2.23 cm/m  RA Area:     14.30 cm LA Vol (A2C):   77.2 ml 40.05 ml/m RA Volume:   30.90 ml  16.03 ml/m LA Vol (A4C):   31.0 ml 16.08 ml/m LA Biplane Vol: 48.8 ml 25.32 ml/m  AORTIC VALVE LVOT Vmax:   57.30 cm/s LVOT Vmean:  32.700 cm/s LVOT VTI:    0.072 m  AORTA Ao Root diam: 3.10 cm Ao Asc diam:  3.20 cm MITRAL VALVE                TRICUSPID VALVE MV Area (PHT): 5.07 cm     TR Peak grad:   13.6 mmHg MV Decel Time: 150 msec     TR Vmax:        184.60 cm/s MV E velocity: 122.00 cm/s                             SHUNTS                             Systemic VTI:  0.07 m                             Systemic Diam: 1.80 cm Dani Gobble Croitoru MD Electronically signed by Sanda Klein MD Signature Date/Time: 06/22/2019/10:45:23 AM    Final    US Abdomen Limited RUQ  Result Date: 06/22/2019 CLINICAL DATA:  Elevated liver function tests. EXAM: ULTRASOUND ABDOMEN LIMITED RIGHT UPPER QUADRANT COMPARISON:  CT chest 06/21/2019. CT chest, abdomen and pelvis 03/01/2019. FINDINGS: Gallbladder: Stones are seen in the gallbladder measuring up to 1.5 cm. There is pericholecystic fluid. The gallbladder wall is thickened at 0.4 cm. Common bile duct: Diameter: 0.3 cm Liver: No focal lesion identified. Within normal limits in parenchymal echogenicity. Portal vein is patent on color Doppler imaging with normal direction of blood flow towards the liver. Other:  Small right pleural effusion is noted. IMPRESSION: Gallstones with pericholecystic fluid and wall thickening compatible with acute cholecystitis. Small appearing right pleural effusion. Electronically Signed   By: Inge Rise M.D.   On: 06/22/2019 15:23    Cardiac Studies   Echo 06/22/19 IMPRESSIONS    1. There is no  left ventricular thrombus. Left ventricular ejection  fraction, by estimation, is 20 to 25%. The left ventricle has severely  decreased function. The left ventricle demonstrates global hypokinesis.  Indeterminate diastolic filling due to E-A  fusion.  2. Right ventricular systolic function is moderately reduced. The right  ventricular size is mildly enlarged. There is normal pulmonary artery  systolic pressure. The estimated right ventricular systolic pressure is  32.6 mmHg.  3. Left atrial size was mild to moderately dilated.  4. The pericardial effusion is circumferential. Moderate pleural effusion  in the left lateral region.  5. The mitral valve is normal in structure. Moderate to severe mitral  valve regurgitation. No evidence of mitral stenosis.  6. Tricuspid valve regurgitation is severe.  7. The aortic valve is tricuspid. Aortic valve regurgitation is not  visualized. No aortic stenosis is present.  8. The inferior vena cava is dilated in size with <50% respiratory  variability, suggesting right atrial pressure of 15 mmHg.   Comparison(s): Changes from prior study are noted. Compared to 01/22/2019,  there is a marked reduction in left ventricular systolic function,  moderate reduction in right ventricular systolic function, there is new  moderate to severe mitral insufficiency  (likely secondary/functional) and new severe tricuspid insufficiency.   FINDINGS  Left Ventricle: There is no left ventricular thrombus. Left ventricular  ejection fraction, by estimation, is 20 to 25%. The left ventricle has  severely decreased function. The left  ventricle demonstrates global  hypokinesis. Definity contrast agent was  given IV to delineate the left ventricular endocardial borders. The left  ventricular internal cavity size was normal in size. There is no left  ventricular hypertrophy. Indeterminate diastolic filling due to E-A  fusion.   Right Ventricle: The right ventricular size is mildly enlarged. No  increase in right ventricular wall thickness. Right ventricular systolic  function is moderately reduced. There is normal pulmonary artery systolic  pressure. The tricuspid regurgitant  velocity is 1.85 m/s, and with an assumed right atrial pressure of 15  mmHg, the estimated right ventricular systolic pressure is 71.2 mmHg.   Left Atrium: Left atrial size was mild to moderately dilated.   Right Atrium: Right atrial size was normal in size.   Pericardium: Trivial pericardial effusion is present. The pericardial  effusion is circumferential.   Mitral Valve: The mitral valve is normal in structure. There is mild  thickening of the mitral valve leaflet(s). Mild mitral annular  calcification. Moderate to severe mitral valve regurgitation, with  centrally-directed jet. No evidence of mitral valve  stenosis.   Tricuspid Valve: The tricuspid valve is normal in structure. Tricuspid  valve regurgitation is severe. The flow in the hepatic veins is reversed  during ventricular systole.   Aortic Valve: The aortic valve is tricuspid. Aortic valve regurgitation is  not visualized. No aortic stenosis is present.   Pulmonic Valve: The pulmonic valve was not well visualized. Pulmonic valve  regurgitation is not visualized.   Aorta: The aortic root and ascending aorta are structurally normal, with  no evidence of dilitation.   ABD ultrasound IMPRESSION: Gallstones with pericholecystic fluid and wall thickening compatible with acute cholecystitis.  Small appearing right pleural effusion.   Patient Profile     64 y.o. female  with a hx of palpitations thought to be PVCs, HTN, non hodgkins lymphoma last treatment 5 weeks ago, presented with SOB, abd pain and vomiting and in sinus tachycardia and new CM with EF 20-25% on Echo along with RV function moderately reduced.  +  MR and TR.  Also with acute chole.  Assessment & Plan    Sinus tach with rates mostly in 120s but up to 140 during the night  New Cardiomyopathy at 20-25% and moderately reduced RV function.  Most likely from chemo (R-CHOP)  HR may be decreasing EF as well. --I&0 323 and wt up from 92.5 kg to 96.3 Kg  --lasix 40 IV BID  --Pk troponin 90  --prior to discharge would need entresto, BB and spironolactone depending on course with multiple issues  --Rt and Lt cardiac cath when stable. But timing with possible surgery + acute cholecystitis   --may need outpt AHF team with their work with chemo induced CM  New moderate to severe MR and severe TR due to CM monitor   NSVT over night up to 6 beats.  -- K+ 3.7 keep at 4 check Mg+  --may need to add BB, with climbing LFTs amiodarone may not be beneficial  Rt abd pain with acute cholecystitis.  LFTs continue to rise AST on admit 68 now 293 and ALT was 71 now 320  Possible surgical consult.  Stage IV non-Hodgkin's lymphoma, mantle cell lymphoma  Therapy with R-CHOP with Neulasta support - oncologist did come by last evening.   UTI  Per IM  Elevated ddimer with neg PE on CTA of chest   HLD on crestor.      For questions or updates, please contact [place] Please consult [email or web address] for contact info under        Signed, Cecilie Kicks, NP  06/23/2019, 8:10 AM    Patient seen and examined with Cecilie Kicks NP.  Agree as above, with the following exceptions and changes as noted below. She feels better today and is diuresing well. Was able to ambulate with improved symptoms. Gen: NAD, CV: tachycardic, 2/6 HSM murmurs, Lungs: clear, Abd: soft, TTP RUQ, Extrem: Warm, well perfused, trace pedal edema,  Neuro/Psych: alert and oriented x 3, normal mood and affect. All available labs, radiology testing, previous records reviewed.   New cardiomyopathy will require workup to exclude ischemia. I have discussed this at length with patient and her daughter [name]. All questions answered. I am hopeful she will improve with non-operative management for acute cholecystitis. We will continue to address cardiomyopathy, continue lasix IV. Will plan for further optimization and then [facility] with advanced HF team on Friday.   Elouise Munroe, MD

## 2019-06-23 NOTE — Progress Notes (Signed)
CHART NOTE I saw Ms. Natalie Baker on 06/22/2019 evening.  Her daughter Natalie Baker was at the bedside.  The patient was admitted with shortness of breath and abdominal pain.  She was diagnosed with cholecystitis.  She had mild swelling of the lower extremities and orthopnea.  She also has a small bilateral pleural effusion.  She underwent 2D echo earlier today that showed significant reduction of her ejection fraction from the baseline before starting systemic chemotherapy.  This reduction could be related to her previous chemotherapy with doxorubicin but the patient never exceeded the standard dose of this treatment.  She is currently undergoing evaluation and management of the congestive heart failure by cardiology.  I appreciate their help.  We will continue conservative management for now.  Thank you so much for taking good care of Ms. Natalie Baker.  I will continue to follow up the patient with you and assist in her management on as-needed basis.

## 2019-06-23 NOTE — Progress Notes (Signed)
PROGRESS NOTE    Natalie Baker  WLS:[id number] DOB: 1955/03/25 DOA: 06/21/2019 PCP: [name], MD   No chief complaint on file.   Brief Narrative:  Natalie Baker is Natalie Baker 64 y.o. femalewith medical history significant ofstage IV non-hodgkin's lymphoma, mantel cell lymphoma, essential hypertension, hypothyroidism, obesity. Patient presented secondary to dyspnea on exertion with evidence of sinus tachycardia. Admitted for persistent tachycardia.  Assessment & Plan:   Active Problems:   Hyperlipidemia   Hypothyroidism   Morbid obesity ([place]) BMI 35+ with prediabetes, hyperlipidemia, htn   Prediabetes   Mantle cell lymphoma (HCC)   Sinus tachycardia  Heart Failure with Reduced Systolic Function with Exacerbation: Appreciate cardiology recommendations Continue lasix 40 mg IV BID Starting coreg low dose as noted below Strict I/O, daily weights Plan for right and left cardiac cath when stable Echo with EF 20-25%, moderately reduced RVSF (see report)  Acute Cholecystitis  Elevated Liver Enzymes: surgery c/s, appreciate recs.   Continue abx  Surgery c/s, appreciate recs - plan to manage with abx alone, lap chole if cleared by cards for general anesthesia - If worsens and not candidate for surgery, c/s IR for perc chole Will defer to cardiology for clearance LFT's up today, will continue to follow, normal bili and alk phos  NSVT: start coreg and follow.   Sinus tachycardia - likely 2/2 HF above - follow with treatment - CTA chest negative for PE.  -Check TSH, mildly elevated, follow free T4 -Transthoracic Echocardiogram (as noted above) -Telemetry -Cardiology recommendations  Acute Hypoxic Respiratory Failure  Dyspnea on exertion 2/2 HF above, follow with therapy  Elevated troponin -Peaked at 90 -HF as noted above -cardiology c/s, appreciate recs  Hypothyroidism Patient is on Amour 90 mg as an outpatient -Continue Amour 90 mg -TSH as mentioned above -  follow free T4  Elevated creatinine - improved, follow with diuresis  Abnormal UA Patient has no UTI symptoms. It may be possible tachycardia is secondary to infection. Started on empiric Ceftriaxone No urine cx obtained Pt now on zosyn for cholecystitis  Hyperkalemia Mildly elevated at 5.7. -Repeat potassium  History of Mantle Cell Lymphoma Currently in observation. Completed chemotherapy just over one month ago. Patient follows with Dr. Julien Nordmann.  Prediabetes Currently diet controlled. Hemoglobin A1C of 5.7% in 2020. -Carb modified diet  Hormone deficiency s/p bilateral hysterectomy with bilateral oophorectomy Patient is on estradiol and progesterone as an outpatient -Continue progesterone  Obesity Body mass index is 37.3 kg/m.   DVT prophylaxis: lovenox Code Status: full  Family Communication: daughter at bedside Disposition:   Status is: Inpatient  Remains inpatient appropriate because:Inpatient level of care appropriate due to severity of illness   Dispo: The patient is from: Home              Anticipated d/c is to: pending              Anticipated d/c date is: > 3 days              Patient currently is not medically stable to d/c.   Consultants:   Cardiology  Oncology  Surgery  Procedures:  Echo IMPRESSIONS    1. There is no left ventricular thrombus. Left ventricular ejection  fraction, by estimation, is 20 to 25%. The left ventricle has severely  decreased function. The left ventricle demonstrates global hypokinesis.  Indeterminate diastolic filling due to E-Arbie Reisz  fusion.  2. Right ventricular systolic function is moderately reduced. The right  ventricular size is mildly  enlarged. There is normal pulmonary artery  systolic pressure. The estimated right ventricular systolic pressure is  50.9 mmHg.  3. Left atrial size was mild to moderately dilated.  4. The pericardial effusion is circumferential. Moderate pleural effusion  in the  left lateral region.  5. The mitral valve is normal in structure. Moderate to severe mitral  valve regurgitation. No evidence of mitral stenosis.  6. Tricuspid valve regurgitation is severe.  7. The aortic valve is tricuspid. Aortic valve regurgitation is not  visualized. No aortic stenosis is present.  8. The inferior vena cava is dilated in size with <50% respiratory  variability, suggesting right atrial pressure of 15 mmHg.   Antimicrobials: Anti-infectives (From admission, onward)   Start     Dose/Rate Route Frequency Ordered Stop   06/22/19 2000  piperacillin-tazobactam (ZOSYN) IVPB 3.375 g     Discontinue     3.375 g 12.5 mL/hr over 240 Minutes Intravenous Every 8 hours 06/22/19 1821     06/22/19 1800  cefTRIAXone (ROCEPHIN) 1 g in sodium chloride 0.9 % 100 mL IVPB  Status:  Discontinued        1 g 200 mL/hr over 30 Minutes Intravenous Every 24 hours 06/22/19 0014 06/22/19 1821   06/21/19 1700  cefTRIAXone (ROCEPHIN) 1 g in sodium chloride 0.9 % 100 mL IVPB        1 g 200 mL/hr over 30 Minutes Intravenous  Once 06/21/19 1650 06/21/19 1758      Subjective: C/o SOB RUQ pain - abd pain for months  Objective: Vitals:   06/23/19 0917 06/23/19 1241 06/23/19 1433 06/23/19 1815  BP: 111/84 (!) 112/57 (!) 108/49 108/66  Pulse: (!) 122 (!) 110 (!) 121 (!) 125  Resp:  '16 16 18  ' Temp: 97.6 F (36.4 C) 97.9 F (36.6 C) 98.2 F (36.8 C) 98.1 F (36.7 C)  TempSrc: Oral Oral Oral Oral  SpO2: 98% 100% 100% 100%  Weight:      Height:        Intake/Output Summary (Last 24 hours) at 06/23/2019 1847 Last data filed at 06/23/2019 1816 Gross per 24 hour  Intake 810 ml  Output 2400 ml  Net -1590 ml   Filed Weights   06/22/19 0144 06/23/19 0523  Weight: 92.5 kg 96.3 kg    Examination:  General exam: Appears calm and comfortable  Respiratory system: Clear to auscultation. Respiratory effort normal. Cardiovascular system: S1 & S2 heard, RRR.  Gastrointestinal system:  Abdomen is nondistended, soft and RUQ ttp Central nervous system: Alert and oriented. No focal neurological deficits. Extremities: bilateral 1+ LE edema Skin: No rashes, lesions or ulcers Psychiatry: Judgement and insight appear normal. Mood & affect appropriate.     Data Reviewed: I have personally reviewed following labs and imaging studies  CBC: Recent Labs  Lab 06/21/19 1255 06/22/19 0449 06/23/19 0332  WBC 9.5 9.3 8.0  NEUTROABS 7.4  --   --   HGB 13.8 12.8 11.7*  HCT 44.0 41.6 37.8  MCV 92.1 94.5 92.4  PLT 298 266 326    Basic Metabolic Panel: Recent Labs  Lab 06/21/19 1255 06/21/19 1721 06/22/19 0449 06/23/19 0332  NA 136  --  136 137  K 5.7* 4.9 5.0 3.7  CL 105  --  106 107  CO2 20*  --  20* 20*  GLUCOSE 114*  --  130* 89  BUN 16  --  19 18  CREATININE 1.02*  --  1.06* 0.98  CALCIUM 8.9  --  8.1* 8.0*  MG  --   --   --  2.0    GFR: Estimated Creatinine Clearance: 63.6 mL/min (by C-G formula based on SCr of 0.98 mg/dL).  Liver Function Tests: Recent Labs  Lab 06/21/19 1255 06/22/19 0449 06/23/19 0332  AST 68* 193* 293*  ALT 71* 213* 320*  ALKPHOS 76 70 64  BILITOT 0.7 0.8 0.6  PROT 6.7 6.1* 5.5*  ALBUMIN 4.1 3.8 3.4*    CBG: No results for input(s): GLUCAP in the last 168 hours.   Recent Results (from the past 240 hour(s))  SARS Coronavirus 2 by RT PCR (hospital order, performed in [facility] hospital lab) Nasopharyngeal Nasopharyngeal Swab     Status: None   Collection Time: 06/21/19  5:21 PM   Specimen: Nasopharyngeal Swab  Result Value Ref Range Status   SARS Coronavirus 2 NEGATIVE NEGATIVE Final    Comment: (NOTE) SARS-CoV-2 target nucleic acids are NOT DETECTED.  The SARS-CoV-2 RNA is generally detectable in upper and lower respiratory specimens during the acute phase of infection. The lowest concentration of SARS-CoV-2 viral copies this assay can detect is 250 copies / mL. Providencia Hottenstein negative result does not preclude SARS-CoV-2  infection and should not be used as the sole basis for treatment or other patient management decisions.  Christinea Brizuela negative result may occur with improper specimen collection / handling, submission of specimen other than nasopharyngeal swab, presence of viral mutation(s) within the areas targeted by this assay, and inadequate number of viral copies (<250 copies / mL). Korvin Valentine negative result must be combined with clinical observations, patient history, and epidemiological information.  Fact Sheet for Patients:   StrictlyIdeas.no  Fact Sheet for Healthcare Providers: BankingDealers.co.za  This test is not yet approved or  cleared by the [place] FDA and has been authorized for detection and/or diagnosis of SARS-CoV-2 by FDA under an Emergency Use Authorization (EUA).  This EUA will remain in effect (meaning this test can be used) for the duration of the COVID-19 declaration under Section 564(b)(1) of the Act, 21 U.S.C. section 360bbb-3(b)(1), unless the authorization is terminated or revoked sooner.  Performed at [facility], [place] [address] [place], [place] [address]          Radiology Studies: ECHOCARDIOGRAM COMPLETE  Result Date: 06/22/2019    ECHOCARDIOGRAM REPORT   Patient Name:   ASHETON VIRAMONTES Date of Exam: 06/22/2019 Medical Rec #:  [id number]        Height:       62.0 in Accession #:    [id number]       Weight:       203.9 lb Date of Birth:  Aug 14, 1955       BSA:          1.928 m Patient Age:    31 years         BP:           107/91 mmHg Patient Gender: F                HR:           128 bpm. Exam Location:  Inpatient Procedure: 2D Echo, Cardiac Doppler, Color Doppler and Intracardiac            Opacification Agent Indications:    Elevated Troponin  History:        Patient has prior history of Echocardiogram examinations, most  recent 01/22/2019. Risk Factors:Dyslipidemia. Cancer.                  Hypothyroidism.  Sonographer:    Jonelle Sidle Dance Referring Phys: [place]  1. There is no left ventricular thrombus. Left ventricular ejection fraction, by estimation, is 20 to 25%. The left ventricle has severely decreased function. The left ventricle demonstrates global hypokinesis. Indeterminate diastolic filling due to E-Jiah Bari  fusion.  2. Right ventricular systolic function is moderately reduced. The right ventricular size is mildly enlarged. There is normal pulmonary artery systolic pressure. The estimated right ventricular systolic pressure is 17.6 mmHg.  3. Left atrial size was mild to moderately dilated.  4. The pericardial effusion is circumferential. Moderate pleural effusion in the left lateral region.  5. The mitral valve is normal in structure. Moderate to severe mitral valve regurgitation. No evidence of mitral stenosis.  6. Tricuspid valve regurgitation is severe.  7. The aortic valve is tricuspid. Aortic valve regurgitation is not visualized. No aortic stenosis is present.  8. The inferior vena cava is dilated in size with <50% respiratory variability, suggesting right atrial pressure of 15 mmHg. Comparison(s): Changes from prior study are noted. Compared to 01/22/2019, there is Madalin Hughart marked reduction in left ventricular systolic function, moderate reduction in right ventricular systolic function, there is new moderate to severe mitral insufficiency (likely secondary/functional) and new severe tricuspid insufficiency. FINDINGS  Left Ventricle: There is no left ventricular thrombus. Left ventricular ejection fraction, by estimation, is 20 to 25%. The left ventricle has severely decreased function. The left ventricle demonstrates global hypokinesis. Definity contrast agent was given IV to delineate the left ventricular endocardial borders. The left ventricular internal cavity size was normal in size. There is no left ventricular hypertrophy. Indeterminate diastolic filling due to E-Michaelann Gunnoe  fusion. Right Ventricle: The right ventricular size is mildly enlarged. No increase in right ventricular wall thickness. Right ventricular systolic function is moderately reduced. There is normal pulmonary artery systolic pressure. The tricuspid regurgitant velocity is 1.85 m/s, and with an assumed right atrial pressure of 15 mmHg, the estimated right ventricular systolic pressure is 16.0 mmHg. Left Atrium: Left atrial size was mild to moderately dilated. Right Atrium: Right atrial size was normal in size. Pericardium: Trivial pericardial effusion is present. The pericardial effusion is circumferential. Mitral Valve: The mitral valve is normal in structure. There is mild thickening of the mitral valve leaflet(s). Mild mitral annular calcification. Moderate to severe mitral valve regurgitation, with centrally-directed jet. No evidence of mitral valve stenosis. Tricuspid Valve: The tricuspid valve is normal in structure. Tricuspid valve regurgitation is severe. The flow in the hepatic veins is reversed during ventricular systole. Aortic Valve: The aortic valve is tricuspid. Aortic valve regurgitation is not visualized. No aortic stenosis is present. Pulmonic Valve: The pulmonic valve was not well visualized. Pulmonic valve regurgitation is not visualized. Aorta: The aortic root and ascending aorta are structurally normal, with no evidence of dilitation. Venous: The inferior vena cava is dilated in size with less than 50% respiratory variability, suggesting right atrial pressure of 15 mmHg. IAS/Shunts: No atrial level shunt detected by color flow Doppler. Additional Comments: There is Taurus Alamo moderate pleural effusion in the left lateral region.  LEFT VENTRICLE PLAX 2D LVIDd:         5.10 cm LVIDs:         4.20 cm LV PW:         1.10 cm LV IVS:  1.00 cm LVOT diam:     1.80 cm LV SV:         18 LV SV Index:   9 LVOT Area:     2.54 cm  RIGHT VENTRICLE            IVC RV Basal diam:  2.90 cm    IVC diam: 1.80 cm RV S  prime:     5.22 cm/s TAPSE (M-mode): 1.4 cm LEFT ATRIUM             Index       RIGHT ATRIUM           Index LA diam:        4.30 cm 2.23 cm/m  RA Area:     14.30 cm LA Vol (A2C):   77.2 ml 40.05 ml/m RA Volume:   30.90 ml  16.03 ml/m LA Vol (A4C):   31.0 ml 16.08 ml/m LA Biplane Vol: 48.8 ml 25.32 ml/m  AORTIC VALVE LVOT Vmax:   57.30 cm/s LVOT Vmean:  32.700 cm/s LVOT VTI:    0.072 m  AORTA Ao Root diam: 3.10 cm Ao Asc diam:  3.20 cm MITRAL VALVE                TRICUSPID VALVE MV Area (PHT): 5.07 cm     TR Peak grad:   13.6 mmHg MV Decel Time: 150 msec     TR Vmax:        184.60 cm/s MV E velocity: 122.00 cm/s                             SHUNTS                             Systemic VTI:  0.07 m                             Systemic Diam: 1.80 cm Dani Gobble Croitoru MD Electronically signed by Sanda Klein MD Signature Date/Time: 06/22/2019/10:45:23 AM    Final    US Abdomen Limited RUQ  Result Date: 06/22/2019 CLINICAL DATA:  Elevated liver function tests. EXAM: ULTRASOUND ABDOMEN LIMITED RIGHT UPPER QUADRANT COMPARISON:  CT chest 06/21/2019. CT chest, abdomen and pelvis 03/01/2019. FINDINGS: Gallbladder: Stones are seen in the gallbladder measuring up to 1.5 cm. There is pericholecystic fluid. The gallbladder wall is thickened at 0.4 cm. Common bile duct: Diameter: 0.3 cm Liver: No focal lesion identified. Within normal limits in parenchymal echogenicity. Portal vein is patent on color Doppler imaging with normal direction of blood flow towards the liver. Other: Small right pleural effusion is noted. IMPRESSION: Gallstones with pericholecystic fluid and wall thickening compatible with acute cholecystitis. Small appearing right pleural effusion. Electronically Signed   By: Inge Rise M.D.   On: 06/22/2019 15:23        Scheduled Meds: . Chlorhexidine Gluconate Cloth  6 each Topical Daily  . enoxaparin (LOVENOX) injection  40 mg Subcutaneous Q24H  . furosemide  40 mg Intravenous BID  .  progesterone  100 mg Oral Daily  . thyroid  90 mg Oral QAC breakfast   Continuous Infusions: . piperacillin-tazobactam (ZOSYN)  IV 3.375 g (06/23/19 1114)     LOS: 1 day    Time spent: over 30 min    Fayrene Helper, MD [facility]   To contact the  attending provider between 7A-7P or the covering provider during after hours 7P-7A, please log into the web site [email or web address] and access using universal [place] password for that web site. If you do not have the password, please call the hospital operator.  06/23/2019, 6:47 PM

## 2019-06-23 NOTE — Progress Notes (Signed)
Physical Therapy Treatment Patient Details Name: Natalie Baker MRN: [id number] DOB: 22-Nov-1955 Today's Date: 06/23/2019    History of Present Illness 64 yo female admitted with sinus tachycardia, DOE, nausea. Hx of stage IV NHL, obesity    PT Comments    HR up to 135 bpm, O2 100%, dyspnea 2/4 with ambulation. Several brief standing rest breaks needed/taken when pt chose to. Used 4 wheeled walker (rollator) for energy conservation and in case pt needed to sit and rest. Will continue to follow during hospital stay.     Follow Up Recommendations  Home health PT vs No PT follow up;Supervision - Intermittent (depending on progress/pt decision)     Equipment Recommendations  None recommended by PT    Recommendations for Other Services       Precautions / Restrictions Precautions Precautions: Fall Precaution Comments: monitor O2, HR Restrictions Weight Bearing Restrictions: No    Mobility  Bed Mobility               General bed mobility comments: sitting EOB with family in room  Transfers Overall transfer level: Needs assistance Equipment used: None Transfers: Sit to/from Stand Sit to Stand: Supervision         General transfer comment: for safety  Ambulation/Gait Ambulation/Gait assistance: Supervision Gait Distance (Feet): 150 Feet Assistive device: 4-wheeled walker Gait Pattern/deviations: Step-through pattern;Decreased stride length     General Gait Details: 2-3 brief standing rest breaks taken/needed. O2 100% on RA, HR 135 bpm with activity. Dyspnea 2/4   Stairs             Wheelchair Mobility    Modified Rankin (Stroke Patients Only)       Balance Overall balance assessment: Mild deficits observed, not formally tested                                          Cognition Arousal/Alertness: Awake/alert Behavior During Therapy: WFL for tasks assessed/performed Overall Cognitive Status: Within Functional Limits for  tasks assessed                                        Exercises      General Comments        Pertinent Vitals/Pain Pain Assessment: No/denies pain    Home Living                      Prior Function            PT Goals (current goals can now be found in the care plan section) Progress towards PT goals: Progressing toward goals    Frequency    Min 3X/week      PT Plan Current plan remains appropriate    Co-evaluation              AM-PAC PT "6 Clicks" Mobility   Outcome Measure  Help needed turning from your back to your side while in a flat bed without using bedrails?: None Help needed moving from lying on your back to sitting on the side of a flat bed without using bedrails?: None Help needed moving to and from a bed to a chair (including a wheelchair)?: A Little Help needed standing up from a chair using your arms (e.g., wheelchair or bedside chair)?: A Little Help  needed to walk in hospital room?: A Little Help needed climbing 3-5 steps with a railing? : A Little 6 Click Score: 20    End of Session   Activity Tolerance: Patient tolerated treatment well Patient left: in bed;with call bell/phone within reach;with family/visitor present   PT Visit Diagnosis: Unsteadiness on feet (R26.81);Muscle weakness (generalized) (M62.81)     Time: 3958-4417 PT Time Calculation (min) (ACUTE ONLY): 10 min  Charges:  $Gait Training: 8-22 mins                         Doreatha Massed, PT Acute Rehabilitation  Office: [phone] Pager: [phone]

## 2019-06-23 NOTE — Progress Notes (Signed)
DBIV consult: Discussed with Anderson Malta, RN and Freda Munro in lab. BNP and magnesium will be added to labs sent this AM.

## 2019-06-23 NOTE — TOC Initial Note (Signed)
Transition of Care [facility]) - Initial/Assessment Note    Patient Details  Name: Natalie Baker MRN: [id number] Date of Birth: 02/11/1955  [facility]) CM/SW Contact:    Dessa Phi, RN Phone Number: 06/23/2019, 12:48 PM  Clinical Narrative:New CHF-recc CHF protocal [facility] will screen-recc HHRN/PT/OT/aide. On 02-will monitor.                   Expected Discharge Plan: [place] Barriers to Discharge: Continued Medical Work up   Patient Goals and CMS Choice Patient states their goals for this hospitalization and ongoing recovery are:: go home CMS Medicare.gov Compare Post Acute Care list provided to:: Patient Choice offered to / list presented to : Patient  Expected Discharge Plan and Services Expected Discharge Plan: [place]   Discharge Planning Services: CM Consult   Living arrangements for the past 2 months: Single Family Home                           HH Arranged: PT, OT, Nurse's Aide, RN [place] Agency: [facility] [facility] [employer] Date [place]: 06/23/19 Time [place]: [place] Representative spoke with at [place]: [place] Arrangements/Services Living arrangements for the past 2 months: [place] with:: Self Patient language and need for interpreter reviewed:: Yes        Need for Family Participation in Patient Care: No (Comment) Care giver support system in place?: Yes (comment)   Criminal Activity/Legal Involvement Pertinent to Current Situation/Hospitalization: No - Comment as needed  Activities of Daily Living Home Assistive Devices/Equipment: Eyeglasses ADL Screening (condition at time of admission) Patient's cognitive ability adequate to safely complete daily activities?: Yes Is the patient deaf or have difficulty hearing?: No Does the patient have difficulty seeing, even when wearing glasses/contacts?: No Does the patient have difficulty  concentrating, remembering, or making decisions?: Yes Patient able to express need for assistance with ADLs?: Yes Does the patient have difficulty dressing or bathing?: No Independently performs ADLs?: Yes (appropriate for developmental age) Does the patient have difficulty walking or climbing stairs?: Yes Weakness of Legs: Both Weakness of Arms/Hands: Both  Permission Sought/Granted Permission sought to share information with : Case Manager Permission granted to share information with : Yes, Verbal Permission Granted  Share Information with NAME: Case Manager  Permission granted to share info w AGENCY: [facility]        Emotional Assessment Appearance:: Appears stated age Attitude/Demeanor/Rapport: Gracious Affect (typically observed): Accepting Orientation: : Oriented to Self, Oriented to Place, Oriented to  Time, Oriented to Situation Alcohol / Substance Use: Not Applicable Psych Involvement: No (comment)  Admission diagnosis:  Sinus tachycardia [R00.0] Tachycardia [R00.0] Acute cystitis without hematuria [N30.00] Dyspnea, unspecified type [R06.00] Patient Active Problem List   Diagnosis Date Noted  . Sinus tachycardia 06/21/2019  . Port-A-Cath in place 02/03/2019  . Encounter for antineoplastic chemotherapy 01/20/2019  . Mantle cell lymphoma ([place]) 01/12/2019  . Goals of care, counseling/discussion 01/12/2019  . Aortic atherosclerosis ([place]) 12/22/2018  . Prediabetes 07/26/2015  . 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 Morbid obesity ([place]) BMI 35+ with prediabetes, hyperlipidemia, htn   . Osteoporosis   . Hypothyroidism 12/25/2012  . Hyperlipidemia   . Vitamin D deficiency    PCP:  [name], MD Pharmacy:   [id number] - [place], [place] - [address] [place] [address] Phone: [phone] Fax: [phone]     Social Determinants of Health (SDOH) Interventions    Readmission Risk Interventions No flowsheet data  found.

## 2019-06-23 NOTE — Progress Notes (Signed)
Telemetry Pt with 12 Ventricular Beats. Pt reassessed and no acute changes noted at this time. Pt denies any c/o pain or other needs. MD updated via phone. Maintain current plan of care for Pt. Monitor Pt closely

## 2019-06-24 DIAGNOSIS — R570 Cardiogenic shock: Secondary | ICD-10-CM

## 2019-06-24 LAB — COMPREHENSIVE METABOLIC PANEL
ALT: 256 U/L — ABNORMAL HIGH (ref 0–44)
ALT: 273 U/L — ABNORMAL HIGH (ref 0–44)
AST: 128 U/L — ABNORMAL HIGH (ref 15–41)
AST: 132 U/L — ABNORMAL HIGH (ref 15–41)
Albumin: 3.5 g/dL (ref 3.5–5.0)
Albumin: 3.6 g/dL (ref 3.5–5.0)
Alkaline Phosphatase: 73 U/L (ref 38–126)
Alkaline Phosphatase: 98 U/L (ref 38–126)
Anion gap: 12 (ref 5–15)
Anion gap: 10 (ref 5–15)
BUN: 19 mg/dL (ref 8–23)
BUN: 16 mg/dL (ref 8–23)
CO2: 27 mmol/L (ref 22–32)
CO2: 29 mmol/L (ref 22–32)
Calcium: 8.1 mg/dL — ABNORMAL LOW (ref 8.9–10.3)
Calcium: 8.6 mg/dL — ABNORMAL LOW (ref 8.9–10.3)
Chloride: 98 mmol/L (ref 98–111)
Chloride: 99 mmol/L (ref 98–111)
Creatinine, Ser: 1.14 mg/dL — ABNORMAL HIGH (ref 0.44–1.00)
Creatinine, Ser: 1.28 mg/dL — ABNORMAL HIGH (ref 0.44–1.00)
GFR calc Af Amer: 59 mL/min — ABNORMAL LOW (ref >60)
GFR calc Af Amer: 52 mL/min — ABNORMAL LOW (ref >60)
GFR calc non Af Amer: 51 mL/min — ABNORMAL LOW (ref >60)
GFR calc non Af Amer: 44 mL/min — ABNORMAL LOW (ref >60)
Glucose, Bld: 96 mg/dL (ref 70–99)
Glucose, Bld: 123 mg/dL — ABNORMAL HIGH (ref 70–99)
Potassium: 3 mmol/L — ABNORMAL LOW (ref 3.5–5.1)
Potassium: 4 mmol/L (ref 3.5–5.1)
Sodium: 137 mmol/L (ref 135–145)
Sodium: 138 mmol/L (ref 135–145)
Total Bilirubin: 0.7 mg/dL (ref 0.3–1.2)
Total Bilirubin: 0.4 mg/dL (ref 0.3–1.2)
Total Protein: 5.7 g/dL — ABNORMAL LOW (ref 6.5–8.1)
Total Protein: 6.1 g/dL — ABNORMAL LOW (ref 6.5–8.1)

## 2019-06-24 LAB — CBC WITH DIFFERENTIAL/PLATELET
Abs Immature Granulocytes: 0.04 10*3/uL (ref 0.00–0.07)
Basophils Absolute: 0.1 10*3/uL (ref 0.0–0.1)
Basophils Relative: 1 %
Eosinophils Absolute: 0.1 10*3/uL (ref 0.0–0.5)
Eosinophils Relative: 1 %
HCT: 36 % (ref 36.0–46.0)
Hemoglobin: 11.4 g/dL — ABNORMAL LOW (ref 12.0–15.0)
Immature Granulocytes: 1 %
Lymphocytes Relative: 5 %
Lymphs Abs: 0.4 10*3/uL — ABNORMAL LOW (ref 0.7–4.0)
MCH: 29.1 pg (ref 26.0–34.0)
MCHC: 31.7 g/dL (ref 30.0–36.0)
MCV: 91.8 fL (ref 80.0–100.0)
Monocytes Absolute: 0.8 10*3/uL (ref 0.1–1.0)
Monocytes Relative: 10 %
Neutro Abs: 6.1 10*3/uL (ref 1.7–7.7)
Neutrophils Relative %: 82 %
Platelets: 181 10*3/uL (ref 150–400)
RBC: 3.92 MIL/uL (ref 3.87–5.11)
RDW: 14.9 % (ref 11.5–15.5)
WBC: 7.5 10*3/uL (ref 4.0–10.5)
nRBC: 0 % (ref 0.0–0.2)

## 2019-06-24 LAB — SURGICAL PCR SCREEN
MRSA, PCR: NEGATIVE
Staphylococcus aureus: NEGATIVE

## 2019-06-24 LAB — MAGNESIUM: Magnesium: 1.8 mg/dL (ref 1.7–2.4)

## 2019-06-24 LAB — PHOSPHORUS: Phosphorus: 3.7 mg/dL (ref 2.5–4.6)

## 2019-06-24 LAB — LACTIC ACID, PLASMA: Lactic Acid, Venous: 2 mmol/L (ref 0.5–1.9)

## 2019-06-24 MED ORDER — ASPIRIN 81 MG PO CHEW
81.0000 mg | CHEWABLE_TABLET | ORAL | Status: AC
  Administered 2019-06-25: 81 mg via ORAL
  Filled 2019-06-24: qty 1

## 2019-06-24 MED ORDER — SODIUM CHLORIDE 0.9 % IV SOLN: INTRAVENOUS | Status: DC

## 2019-06-24 MED ORDER — POTASSIUM CHLORIDE CRYS ER 20 MEQ PO TBCR
40.0000 meq | EXTENDED_RELEASE_TABLET | ORAL | Status: AC
  Administered 2019-06-24 (×3): 40 meq via ORAL
  Filled 2019-06-24 (×3): qty 2

## 2019-06-24 MED ORDER — SODIUM CHLORIDE 0.9% FLUSH
3.0000 mL | Freq: Two times a day (BID) | INTRAVENOUS | Status: DC
  Administered 2019-06-24 – 2019-07-01 (×10): 3 mL via INTRAVENOUS

## 2019-06-24 MED ORDER — SODIUM CHLORIDE 0.9 % IV SOLN: 250.0000 mL | INTRAVENOUS | Status: DC | PRN

## 2019-06-24 MED ORDER — SODIUM CHLORIDE 0.9% FLUSH: 3.0000 mL | INTRAVENOUS | Status: DC | PRN

## 2019-06-24 MED ORDER — MAGNESIUM SULFATE 2 GM/50ML IV SOLN
2.0000 g | Freq: Once | INTRAVENOUS | Status: AC
  Administered 2019-06-24: 2 g via INTRAVENOUS
  Filled 2019-06-24: qty 50

## 2019-06-24 MED ORDER — DIGOXIN 125 MCG PO TABS
0.1250 mg | ORAL_TABLET | Freq: Every day | ORAL | Status: DC
  Administered 2019-06-24 – 2019-07-02 (×9): 0.125 mg via ORAL
  Filled 2019-06-24 (×9): qty 1

## 2019-06-24 MED ORDER — SPIRONOLACTONE 12.5 MG HALF TABLET
12.5000 mg | ORAL_TABLET | Freq: Every day | ORAL | Status: DC
  Administered 2019-06-24 – 2019-06-27 (×3): 12.5 mg via ORAL
  Filled 2019-06-24 (×3): qty 1

## 2019-06-24 NOTE — Consult Note (Signed)
Advanced Heart Failure Team Consult Note   Primary Physician: [name], MD PCP-Cardiologist:  Elouise Munroe, MD  Reason for Consultation: Acute HF/shock  HPI:    Natalie Baker is a 64 y.o. female with a hx of palpitations thought to be PVCs, HTN, non hodgkins lymphoma last treatment 5 weeks ago whom we are asked to see by Dr. Margaretann Loveless due to new onset systolic HF.   Presented to [facility] with SOB, abd pain and vomiting with sinus tachycardia.  In Jan echo with normal EF  W/u suspicious for acute cholecystitis but also found to have biventricular HF with EF 20% and severe MR/TR.   Course notable for severe sinus tach and 48min run of VT last night. Transferred to [facility] tonight for cath.   Says she is feeling some better but still weak. No CP.    Review of Systems: [y] = yes, [ ]  = no   . General: Weight gain [ ] ; Weight loss [ ] ; Anorexia Blue.Reese ]; Fatigue [ y]; Fever [ ] ; Chills [ ] ; Weakness [ ]   . Cardiac: Chest pain/pressure [ ] ; Resting SOB Blue.Reese ]; Exertional SOB Blue.Reese ]; Orthopnea [ y]; Pedal Edema [ y]; Palpitations [ y]; Syncope [ ] ; Presyncope [ ] ; Paroxysmal nocturnal dyspnea[ y]  . Pulmonary: Cough Blue.Reese ]; Wheezing[ ] ; Hemoptysis[ ] ; Sputum [ ] ; Snoring [ ]   . GI: Vomiting[ ] ; Dysphagia[ ] ; Melena[ ] ; Hematochezia [ ] ; Heartburn[ ] ; Abdominal pain [ ] ; Constipation [ ] ; Diarrhea [ ] ; BRBPR [ ]   . GU: Hematuria[ ] ; Dysuria [ ] ; Nocturia[ ]   . Vascular: Pain in legs with walking [ ] ; Pain in feet with lying flat [ ] ; Non-healing sores [ ] ; Stroke [ ] ; TIA [ ] ; Slurred speech [ ] ;  . Neuro: Headaches[ ] ; Vertigo[ ] ; Seizures[ ] ; Paresthesias[ ] ;Blurred vision [ ] ; Diplopia [ ] ; Vision changes [ ]   . Ortho/Skin: Arthritis [ y]; Joint pain Blue.Reese ]; Muscle pain [ ] ; Joint swelling [ ] ; Back Pain [ ] ; Rash [ ]   . Psych: Depression[ ] ; Anxiety[ ]   . Heme: Bleeding problems [ ] ; Clotting disorders [ ] ; Anemia Blue.Reese ]  . Endocrine: Diabetes [ ] ; Thyroid dysfunction[y ]  Home  Medications Prior to Admission medications   Medication Sig Start Date End Date Taking? Authorizing Provider  ARMOUR THYROID 90 MG tablet Take 90 mg by mouth every morning. 01/22/19  Yes [provider]  estradiol (VIVELLE-DOT) 0.075 MG/24HR Place 1 patch onto the skin 2 (two) times a week.  04/14/19  Yes [provider]  lidocaine-prilocaine (EMLA) cream Apply 1 application topically as needed. Patient taking differently: Apply 1 application topically as needed (for port access).  01/12/19  Yes Heilingoetter, Cassandra L, PA-C  progesterone (PROMETRIUM) 100 MG capsule Take 100 mg by mouth daily.  01/19/18  Yes [provider]  allopurinol (ZYLOPRIM) 100 MG tablet TAKE 1 TABLET BY MOUTH TWICE A DAY Patient not taking: Reported on 06/21/2019 04/20/19   Heilingoetter, Cassandra L, PA-C  ondansetron (ZOFRAN) 8 MG tablet Take 1 tablet (8 mg total) by mouth every 8 (eight) hours as needed for nausea or vomiting. Patient not taking: Reported on 06/21/2019 02/03/19   Heilingoetter, Cassandra L, PA-C  predniSONE (DELTASONE) 50 MG tablet Please take 2 tablets for 5 consecutive days starting on the first day of chemotherapy every cycle. Patient not taking: Reported on 06/21/2019 01/20/19   Heilingoetter, Cassandra L, PA-C  prochlorperazine (COMPAZINE) 10 MG tablet TAKE  1 TABLET (10 MG TOTAL) BY MOUTH EVERY 6 (SIX) HOURS AS NEEDED FOR NAUSEA OR VOMITING. Patient not taking: Reported on 06/21/2019 04/04/19   Heilingoetter, Cassandra L, PA-C  rosuvastatin (CRESTOR) 5 MG tablet Take 1 tablet   3 x /week  (MWF)  for Cholesterol Patient not taking: Reported on 05/13/2019 03/10/19   [name], MD  prochlorperazine (COMPAZINE) 10 MG tablet Take 1 tablet (10 mg total) by mouth every 6 (six) hours as needed for nausea or vomiting. 02/03/19   Heilingoetter, Tobe Sos, PA-C    Past Medical History: Past Medical History:  Diagnosis Date  . Allergy   . Cancer [facility])    being worked up for [place]   . Colon polyp 12/29/2006  . 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 Hyperlipidemia   . Hypothyroidism   . Low kidney function   . Obesity    BMI 34  . Osteoporosis   . Palpitations   . Vitamin D deficiency     Past Surgical History: Past Surgical History:  Procedure Laterality Date  . ABDOMINAL HYSTERECTOMY  1997  . CESAREAN SECTION  1983  . COLONOSCOPY W/ BIOPSIES  12/29/2006  . IR IMAGING GUIDED PORT INSERTION  01/15/2019  . LAPAROSCOPY     1991 for emdometriosis  . UPPER GASTROINTESTINAL ENDOSCOPY      Family History: Family History  Problem Relation Age of Onset  . Heart disease Mother 50       chf  . Diabetes Mother   . Thyroid disease Mother   . Cancer Mother 60       female/ skin  . Hypertension Mother   . Liver disease Mother   . Hypertension Father   . Alcohol abuse Father   . Heart disease Father   . Breast cancer Maternal Aunt   . Hyperlipidemia Maternal Aunt   . Heart disease Maternal Aunt   . Hyperlipidemia Brother   . Hypertension Brother        x 2  . Diabetes Brother   . Hypothyroidism Brother   . Liver disease Maternal Grandmother   . Diabetes Paternal Grandmother   . Bone cancer Maternal Aunt        and skull  . Breast cancer Sister 38  . Diabetes Sister   . Hypothyroidism Sister   . Hashimoto's thyroiditis Sister   . Breast cancer Niece 54  . Colon cancer Neg Hx   . Colon polyps Neg Hx   . Esophageal cancer Neg Hx   . Rectal cancer Neg Hx   . Stomach cancer Neg Hx     Social History: Social History   Socioeconomic History  . Marital status: Married    Spouse name: Not on file  . Number of children: 1  . Years of education: Not on file  . Highest education level: Not on file  Occupational History  . Occupation: Theatre manager  Tobacco Use  . Smoking status: Former Smoker    Years: 15.00    Types: Cigarettes    Quit date: 01/08/1996    Years since quitting: 23.4  . Smokeless tobacco: Never Used  . Tobacco comment: quit 1998  but is exposed to 2nd hand smoke  Vaping Use  . Vaping Use: Never used  Substance and Sexual Activity  . Alcohol use: No  . Drug use: No  . Sexual activity: Yes    Partners: Male    Birth control/protection: Post-menopausal  Other Topics Concern  . 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 Difficulty of Paying Living Expenses:   Food Insecurity:   . Worried About Charity fundraiser in the Last Year:   . Arboriculturist in the Last Year:   Transportation Needs:   . Film/video editor (Medical):   Marland Kitchen Lack of Transportation (Non-Medical):   Physical Activity: Inactive  . Days of Exercise per Week: 0 days  . Minutes of Exercise per Session: 0 min  Stress: No Stress Concern Present  . Feeling of Stress : Only a little  Social Connections:   . Frequency of Communication with Friends and Family:   . Frequency of Social Gatherings with Friends and Family:   . Attends Religious Services:   . Active Member of Clubs or Organizations:   . Attends Archivist Meetings:   Marland Kitchen Marital Status:     Allergies:  Allergies  Allergen Reactions  . Ppd [Tuberculin Purified Protein Derivative]     Positive reaction.  Negative Chest Xray 06/16/12    Objective:    Vital Signs:   Temp:  [97.6 F (36.4 C)-98.6 F (37 C)] 98 F (36.7 C) (06/17 1852) Pulse Rate:  [100-132] 132 (06/17 2017) Resp:  [15-18] 16 (06/17 1854) BP: (91-101)/(59-79) 91/79 (06/17 1854) SpO2:  [96 %-100 %] 97 % (06/17 1854) Weight:  [93.4 kg-95 kg] 93.4 kg (06/17 1854) Last BM Date: 06/22/19  Weight change: Filed Weights   06/23/19 0523 06/24/19 1000 06/24/19 1854  Weight: 96.3 kg 95 kg 93.4 kg    Intake/Output:   Intake/Output Summary (Last 24 hours) at 06/24/2019 2226 Last data filed at 06/24/2019 2137 Gross per 24 hour  Intake 291.15 ml  Output 2250 ml   Net -1958.85 ml      Physical Exam    General:  Weak appearing. Alopecic No resp difficulty HEENT: normal Neck: supple. JVP hard to see Appears elevated. Carotids 2+ bilat; no bruits. No lymphadenopathy or thyromegaly appreciated. Cor: PMI nondisplaced. Regular tachy +s3 3/6 MR/TR Lungs: clear Abdomen: obese soft, nontender, nondistended. No hepatosplenomegaly. No bruits or masses. Good bowel sounds. Extremities: no cyanosis, clubbing, rash, tr-1+ edema cool Neuro: alert & orientedx3, cranial nerves grossly intact. moves all 4 extremities w/o difficulty. Affect pleasant   Telemetry   Sinus tach 130s. Personally reviewed  EKG    Sinus 122 anterior qs Personally reviewed   Labs   Basic Metabolic Panel: Recent Labs  Lab 06/21/19 1255 06/21/19 1255 06/21/19 1721 06/22/19 0449 06/22/19 0449 06/23/19 0332 06/24/19 0500 06/24/19 2030  NA 136  --   --  136  --  137 137 138  K 5.7*   < > 4.9 5.0  --  3.7 3.0* 4.0  CL 105  --   --  106  --  107 98 99  CO2 20*  --   --  20*  --  20* 27 29  GLUCOSE 114*  --   --  130*  --  89 96 123*  BUN 16  --   --  19  --  18 19 16   CREATININE 1.02*  --   --  1.06*  --  0.98 1.14* 1.28*  CALCIUM 8.9   < >  --  8.1*   < > 8.0* 8.1* 8.6*  MG  --   --   --   --   --  2.0 1.8  --   PHOS  --   --   --   --   --   --  3.7  --    < > = values in this interval not displayed.    Liver Function Tests: Recent Labs  Lab 06/21/19 1255 06/22/19 0449 06/23/19 0332 06/24/19 0500 06/24/19 2030  AST 68* 193* 293* 128* 132*  ALT 71* 213* 320* 256* 273*  ALKPHOS 76 70 64 73 98  BILITOT 0.7 0.8 0.6 0.7 0.4  PROT 6.7 6.1* 5.5* 5.7* 6.1*  ALBUMIN 4.1 3.8 3.4* 3.5 3.6   Recent Labs  Lab 06/21/19 1255  LIPASE 21   No results for input(s): AMMONIA in the last 168 hours.  CBC: Recent Labs  Lab 06/21/19 1255 06/22/19 0449 06/23/19 0332 06/24/19 0500  WBC 9.5 9.3 8.0 7.5  NEUTROABS 7.4  --   --  6.1  HGB 13.8 12.8 11.7* 11.4*  HCT 44.0  41.6 37.8 36.0  MCV 92.1 94.5 92.4 91.8  PLT 298 266 208 181    Cardiac Enzymes: No results for input(s): CKTOTAL, CKMB, CKMBINDEX, TROPONINI in the last 168 hours.  BNP: BNP (last 3 results) Recent Labs    06/23/19 0332  BNP 1,170.4*    ProBNP (last 3 results) No results for input(s): PROBNP in the last 8760 hours.   CBG: No results for input(s): GLUCAP in the last 168 hours.  Coagulation Studies: No results for input(s): LABPROT, INR in the last 72 hours.   Imaging    No results found.   Medications:     Current Medications: . [START ON 06/25/2019] aspirin  81 mg Oral Pre-Cath  . Chlorhexidine Gluconate Cloth  6 each Topical Daily  . digoxin  0.125 mg Oral Daily  . enoxaparin (LOVENOX) injection  40 mg Subcutaneous Q24H  . furosemide  40 mg Intravenous BID  . progesterone  100 mg Oral Daily  . sodium chloride flush  3 mL Intravenous Q12H  . spironolactone  12.5 mg Oral Daily  . thyroid  90 mg Oral QAC breakfast     Infusions: . sodium chloride    . [START ON 06/25/2019] sodium chloride    . piperacillin-tazobactam (ZOSYN)  IV 3.375 g (06/24/19 2020)        Assessment/Plan   1. Acute systolic HF with cardiogenic shock - Echo EF 20-25%  moderately reduced RV function with severe MR/TR - most likely from R-CHOP - marked sinus with cool extremities suggests low output - volume status improved - stop b-blocker - start digoxin and spiro. Will likely need inotropic support - Plan R/L cath tomorrow - Check lactic acid. If elevated will move to ICU tonight for inotropic support  2. VT - keep K> 4.0 Mg > 2.0 - low threshold to start amio - will need LifeVest on d/c  3. Rt abd pain with acute cholecystitis.   - suspect this is due to RV failure - continue abx for now. - should improve with Rx of HF  4. Stage IV non-Hodgkin's lymphoma, mantle cell lymphoma - s/p R-CHOP w/ Neulasta - per Dr Earlie Server, conservative mgt for now  5. AKI -  likely due to low ouptu  6. Transaminitis - suspect chock liver/RV failure   CRITICAL CARE Performed by: Glori Bickers  Total critical care time: 35 minutes  Critical care time was exclusive of separately billable procedures and treating other patients.  Critical care was necessary to treat or prevent imminent or life-threatening deterioration.  Critical care was time spent personally by me (independent of midlevel providers or residents) on the following activities: development of treatment plan with patient and/or surrogate as well as nursing, discussions with consultants, evaluation of patient's response to treatment, examination of patient, obtaining history from patient or surrogate, ordering and performing treatments and interventions, ordering and review of laboratory studies, ordering and review of radiographic studies, pulse oximetry and re-evaluation of patient's condition.   Length of Stay: 2  Glori Bickers, MD  06/24/2019, 10:26 PM  Advanced Heart Failure Team Pager [phone] (M-F; [place])  Please contact [facility] for night-coverage after hours (4p -7a ) and weekends on [email or web address]

## 2019-06-24 NOTE — Progress Notes (Signed)
    CC: Shortness of breath, chest heaviness, abdominal pain with vomiting  Subjective: She is doing well this AM. Pain is much better, she is tolerating food well.  She does have some discomfort if she eats to much to quickly.    Objective: Vital signs in last 24 hours: Temp:  [97.6 F (36.4 C)-98.2 F (36.8 C)] 97.7 F (36.5 C) (06/17 0800) Pulse Rate:  [100-125] 108 (06/17 0800) Resp:  [16-20] 17 (06/17 0524) BP: (96-112)/(49-73) 97/73 (06/17 0800) SpO2:  [96 %-100 %] 99 % (06/17 0800) Weight:  [95 kg] 95 kg (06/17 1000) Last BM Date: 06/22/19 660 p.o. recorded 170 IV 2800 urine No BM recorded Afebrile, still tachycardic Heart rate between 100-120s.  Blood pressure systolic is between 46-962X also. Sats are good on 2 L nasal cannula Potassium 3.0, creatinine 1.14, AST T1 28, ALT 256, total bilirubin 0.7 WBC 7.5 H/H 11.4/36 Platelets 181,000.   Intake/Output from previous day: 06/16 0701 - 06/17 0700 In: 830.2 [P.O.:660; IV Piggyback:170.2] Out: 3800 [Urine:3800] Intake/Output this shift: No intake/output data recorded.  General appearance: alert, cooperative and no distress Resp: clear to auscultation bilaterally GI: soft, non-tender; bowel sounds normal; no masses,  no organomegaly  Lab Results:  Recent Labs    06/23/19 0332 06/24/19 0500  WBC 8.0 7.5  HGB 11.7* 11.4*  HCT 37.8 36.0  PLT 208 181    BMET Recent Labs    06/23/19 0332 06/24/19 0500  NA 137 137  K 3.7 3.0*  CL 107 98  CO2 20* 27  GLUCOSE 89 96  BUN 18 19  CREATININE 0.98 1.14*  CALCIUM 8.0* 8.1*   PT/INR No results for input(s): LABPROT, INR in the last 72 hours.  Recent Labs  Lab 06/21/19 1255 06/22/19 0449 06/23/19 0332 06/24/19 0500  AST 68* 193* 293* 128*  ALT 71* 213* 320* 256*  ALKPHOS 76 70 64 73  BILITOT 0.7 0.8 0.6 0.7  PROT 6.7 6.1* 5.5* 5.7*  ALBUMIN 4.1 3.8 3.4* 3.5     Lipase     Component Value Date/Time   LIPASE 21 06/21/2019 1255      Medications: . carvedilol  3.125 mg Oral BID  . Chlorhexidine Gluconate Cloth  6 each Topical Daily  . enoxaparin (LOVENOX) injection  40 mg Subcutaneous Q24H  . furosemide  40 mg Intravenous BID  . potassium chloride  40 mEq Oral Q4H  . progesterone  100 mg Oral Daily  . 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  No findings of disease progression    06/14/2019 Dr. Curt Bears Cardiomyopathy EF 20 to 25%   - plan Stann Main[place], BB and spironolactone; ?Cardiac cath; Hx tobacco use Hypothyroid Prediabetes Sinus tachycardia Chest CTA negative for PE AST/ALT elevation  - AST/ALT 193/213 >>293/320 Dehydration/mild renal insufficiency -improving Possible UTI  Cholecystitis/cholelithiasis  FEN: Heart healthy carb modified diet ID: Rocephin x2 6/15; Zosyn 6/15 >> day 2 DVT: can have DVT chemical prophylaxis from our standpoint/SCD Follow up:  TBD    Plan:  Continue conservative medical management for now.  If she does not continue to improve, or if she becomes worse we would recommend an IR drain.  As she improves and her cardiac function recovers we could plan elective cholecystectomy in the future.      LOS: 2 days    Shirell Struthers 06/24/2019 Please see Amion

## 2019-06-24 NOTE — Progress Notes (Addendum)
PROGRESS NOTE    Natalie Baker  SNK:[id number] DOB: August 22, 1955 DOA: 06/21/2019 PCP: [name], MD   No chief complaint on file.   Brief Narrative:  Natalie Baker is Natalie Baker 64 y.o. femalewith medical history significant ofstage IV non-hodgkin's lymphoma, mantel cell lymphoma, essential hypertension, hypothyroidism, obesity. Patient presented secondary to dyspnea on exertion with evidence of sinus tachycardia. Admitted for persistent tachycardia.  She was found to have HFrEF with exacerbation, cardiology was c/s.  She also was found to have cholecystitis and surgery is following with plan for conservative management.  Transferred to [facility] on 6/17 due to ventricular tachycardia and HF consult.  Plan for [facility] 6/18.  Assessment & Plan:   Active Problems:   Hyperlipidemia   Hypothyroidism   Morbid obesity ([place]) BMI 35+ with prediabetes, hyperlipidemia, htn   Prediabetes   Mantle cell lymphoma (HCC)   Sinus tachycardia   Acute systolic CHF (congestive heart failure) (HCC)   Acute cholecystitis  Heart Failure with Reduced Systolic Function with Exacerbation: Appreciate cardiology recommendations Continue lasix 40 mg IV BID coreg low dose as noted below Strict I/O, daily weights Plan for right and left cardiac cath when stable Echo with EF 20-25%, moderately reduced RVSF (see report)  NSVT: continue coreg, ~ 1 min this AM.  Per cardiology, will transfer to [facility] for evaluation by heart failure service and plan for Cath tomorrow.  Replace lytes, follow mag/k.  Severe TR  Mod to Severe MR: per cards  Acute Cholecystitis  Elevated Liver Enzymes: surgery c/s, appreciate recs.   Continue abx  Surgery c/s, appreciate recs - recommending conservative medical management - if worsens, recommend IR drain - as she improves, can plan for elective cholecystectomy Will defer to cardiology for clearance LFT's up today, will continue to follow, normal bili and alk phos - improving  today  Sinus tachycardia - likely 2/2 HF above - follow with treatment - CTA chest negative for PE.  -Check TSH, mildly elevated, follow free T4 -Transthoracic Echocardiogram (as noted above) -Telemetry -Cardiology recommendations  Acute Hypoxic Respiratory Failure  Dyspnea on exertion 2/2 HF above, follow with therapy  Elevated troponin -Peaked at 90 -HF as noted above -cardiology c/s, appreciate recs  Hypothyroidism Patient is on Amour 90 mg as an outpatient -Continue Amour 90 mg -TSH as mentioned above - follow free T4  Elevated creatinine - improved, follow with diuresis  Abnormal UA Patient has no UTI symptoms. It may be possible tachycardia is secondary to infection. Started on empiric Ceftriaxone No urine cx obtained Pt now on zosyn for cholecystitis  Hyperkalemia  Now Hypokalemia Replace electrolytes as needed Follow mag/k  History of Mantle Cell Lymphoma Currently in observation. Completed chemotherapy just over one month ago. Patient follows with Dr. Julien Nordmann.  Prediabetes Currently diet controlled. Hemoglobin A1C of 5.7% in 2020. -Carb modified diet  Hormone deficiency s/p bilateral hysterectomy with bilateral oophorectomy Patient is on estradiol and progesterone as an outpatient -Continue progesterone  Obesity Body mass index is 37.3 kg/m.   DVT prophylaxis: lovenox Code Status: full  Family Communication: daughter at bedside Disposition:   Status is: Inpatient  Remains inpatient appropriate because:Inpatient level of care appropriate due to severity of illness   Dispo: The patient is from: Home              Anticipated d/c is to: pending              Anticipated d/c date is: > 3 days  Patient currently is not medically stable to d/c.   Consultants:   Cardiology  Oncology  Surgery  Procedures:  Echo IMPRESSIONS    1. There is no left ventricular thrombus. Left ventricular ejection  fraction, by  estimation, is 20 to 25%. The left ventricle has severely  decreased function. The left ventricle demonstrates global hypokinesis.  Indeterminate diastolic filling due to E-A  fusion.  2. Right ventricular systolic function is moderately reduced. The right  ventricular size is mildly enlarged. There is normal pulmonary artery  systolic pressure. The estimated right ventricular systolic pressure is  16.1 mmHg.  3. Left atrial size was mild to moderately dilated.  4. The pericardial effusion is circumferential. Moderate pleural effusion  in the left lateral region.  5. The mitral valve is normal in structure. Moderate to severe mitral  valve regurgitation. No evidence of mitral stenosis.  6. Tricuspid valve regurgitation is severe.  7. The aortic valve is tricuspid. Aortic valve regurgitation is not  visualized. No aortic stenosis is present.  8. The inferior vena cava is dilated in size with <50% respiratory  variability, suggesting right atrial pressure of 15 mmHg.   Antimicrobials: Anti-infectives (From admission, onward)   Start     Dose/Rate Route Frequency Ordered Stop   06/22/19 2000  piperacillin-tazobactam (ZOSYN) IVPB 3.375 g     Discontinue     3.375 g 12.5 mL/hr over 240 Minutes Intravenous Every 8 hours 06/22/19 1821     06/22/19 1800  cefTRIAXone (ROCEPHIN) 1 g in sodium chloride 0.9 % 100 mL IVPB  Status:  Discontinued        1 g 200 mL/hr over 30 Minutes Intravenous Every 24 hours 06/22/19 0014 06/22/19 1821   06/21/19 1700  cefTRIAXone (ROCEPHIN) 1 g in sodium chloride 0.9 % 100 mL IVPB        1 g 200 mL/hr over 30 Minutes Intravenous  Once 06/21/19 1650 06/21/19 1758      Subjective: Feeling better today  Objective: Vitals:   06/24/19 1000 06/24/19 1356 06/24/19 1852 06/24/19 1854  BP:  101/71 91/79 91/79  Pulse:  (!) 111  (!) 129  Resp:  _0 Temp:  98.6 F (37 C) 98 F (36.7 C)   TempSrc:  Oral Oral   SpO2:  96% 96% 97%  Weight: 95 kg    93.4 kg  Height:    5' 2" (1.575 m)    Intake/Output Summary (Last 24 hours) at 06/24/2019 1950 Last data filed at 06/24/2019 1759 Gross per 24 hour  Intake 288.15 ml  Output 2850 ml  Net -2561.85 ml   Filed Weights   06/23/19 0523 06/24/19 1000 06/24/19 1854  Weight: 96.3 kg 95 kg 93.4 kg    Examination:  General: No acute distress. Cardiovascular: Heart sounds show a regular rate, and rhythm.  Lungs: Clear to auscultation bilaterally Abdomen: Soft, nontender, nondistended  Neurological: Alert and oriented 3. Moves all extremities 4. Cranial nerves II through XII grossly intact. Skin: Warm and dry. No rashes or lesions. Extremities: No clubbing or cyanosis. Improved LE edema.   Data Reviewed: I have personally reviewed following labs and imaging studies  CBC: Recent Labs  Lab 06/21/19 1255 06/22/19 0449 06/23/19 0332 06/24/19 0500  WBC 9.5 9.3 8.0 7.5  NEUTROABS 7.4  --   --  6.1  HGB 13.8 12.8 11.7* 11.4*  HCT 44.0 41.6 37.8 36.0  MCV 92.1 94.5 92.4 91.8  PLT 298 266 208 181  Basic Metabolic Panel: Recent Labs  Lab 06/21/19 1255 06/21/19 1721 06/22/19 0449 06/23/19 0332 06/24/19 0500  NA 136  --  136 137 137  K 5.7* 4.9 5.0 3.7 3.0*  CL 105  --  106 107 98  CO2 20*  --  20* 20* 27  GLUCOSE 114*  --  130* 89 96  BUN 16  --  _0 CREATININE 1.02*  --  1.06* 0.98 1.14*  CALCIUM 8.9  --  8.1* 8.0* 8.1*  MG  --   --   --  2.0 1.8  PHOS  --   --   --   --  3.7    GFR: Estimated Creatinine Clearance: 53.7 mL/min (A) (by C-G formula based on SCr of 1.14 mg/dL (H)).  Liver Function Tests: Recent Labs  Lab 06/21/19 1255 06/22/19 0449 06/23/19 0332 06/24/19 0500  AST 68* 193* 293* 128*  ALT 71* 213* 320* 256*  ALKPHOS 76 70 64 73  BILITOT 0.7 0.8 0.6 0.7  PROT 6.7 6.1* 5.5* 5.7*  ALBUMIN 4.1 3.8 3.4* 3.5    CBG: No results for input(s): GLUCAP in the last 168 hours.   Recent Results (from the past 240 hour(s))  SARS Coronavirus 2 by  RT PCR (hospital order, performed in [facility] hospital lab) Nasopharyngeal Nasopharyngeal Swab     Status: None   Collection Time: 06/21/19  5:21 PM   Specimen: Nasopharyngeal Swab  Result Value Ref Range Status   SARS Coronavirus 2 NEGATIVE NEGATIVE Final    Comment: (NOTE) SARS-CoV-2 target nucleic acids are NOT DETECTED.  The SARS-CoV-2 RNA is generally detectable in upper and lower respiratory specimens during the acute phase of infection. The lowest concentration of SARS-CoV-2 viral copies this assay can detect is 250 copies / mL. A negative result does not preclude SARS-CoV-2 infection and should not be used as the sole basis for treatment or other patient management decisions.  A negative result may occur with improper specimen collection / handling, submission of specimen other than nasopharyngeal swab, presence of viral mutation(s) within the areas targeted by this assay, and inadequate number of viral copies (<250 copies / mL). A negative result must be combined with clinical observations, patient history, and epidemiological information.  Fact Sheet for Patients:   StrictlyIdeas.no  Fact Sheet for Healthcare Providers: BankingDealers.co.za  This test is not yet approved or  cleared by the [place] FDA and has been authorized for detection and/or diagnosis of SARS-CoV-2 by FDA under an Emergency Use Authorization (EUA).  This EUA will remain in effect (meaning this test can be used) for the duration of the COVID-19 declaration under Section 564(b)(1) of the Act, 21 U.S.C. section 360bbb-3(b)(1), unless the authorization is terminated or revoked sooner.  Performed at [facility], [place] [address], [place], [place] [address]          Radiology Studies: No results found.      Scheduled Meds: . [START ON 06/25/2019] aspirin  81 mg Oral Pre-Cath  . Chlorhexidine Gluconate Cloth  6 each  Topical Daily  . digoxin  0.125 mg Oral Daily  . enoxaparin (LOVENOX) injection  40 mg Subcutaneous Q24H  . furosemide  40 mg Intravenous BID  . progesterone  100 mg Oral Daily  . sodium chloride flush  3 mL Intravenous Q12H  . spironolactone  12.5 mg Oral Daily  . thyroid  90 mg Oral QAC breakfast   Continuous Infusions: . sodium chloride    . [  START ON 06/25/2019] sodium chloride    . piperacillin-tazobactam (ZOSYN)  IV 3.375 g (06/24/19 1139)     LOS: 2 days    Time spent: over 50 min    Fayrene Helper, MD [facility]   To contact the attending provider between 7A-7P or the covering provider during after hours 7P-7A, please log into the web site [email or web address] and access using universal [place] password for that web site. If you do not have the password, please call the hospital operator.  06/24/2019, 7:50 PM

## 2019-06-24 NOTE — Progress Notes (Signed)
Physical Therapy Treatment and Discharge from Acute PT Patient Details Name: Natalie Baker MRN: [id number] DOB: 07-22-1955 Today's Date: 06/24/2019    History of Present Illness 64 yo female admitted with sinus tachycardia, DOE, nausea. Hx of stage IV NHL, obesity    PT Comments    Pt ambulated in hallway with supervision.  HR 129-133 bpm at times and pt required multiple standing rest breaks.  Pt agreeable to ambulate and take breaks as needed with family and nursing staff and declines further need for acute PT at this time.  Pt may benefit from HHPT upon d/c if agreeable.  PT to sign off.   Follow Up Recommendations  Home health PT     Equipment Recommendations  None recommended by PT    Recommendations for Other Services       Precautions / Restrictions Precautions Precautions: Fall Precaution Comments: monitor HR    Mobility  Bed Mobility               General bed mobility comments: sitting EOB with family in room  Transfers Overall transfer level: Needs assistance Equipment used: None Transfers: Sit to/from Stand Sit to Stand: Supervision         General transfer comment: for safety  Ambulation/Gait Ambulation/Gait assistance: Supervision Gait Distance (Feet): 160 Feet Assistive device: None Gait Pattern/deviations: Step-through pattern;Decreased stride length     General Gait Details: 4 standing rest breaks required due to dyspnea, HR elevated at these times 129-133 bpm   Stairs             Wheelchair Mobility    Modified Rankin (Stroke Patients Only)       Balance                                            Cognition Arousal/Alertness: Awake/alert Behavior During Therapy: WFL for tasks assessed/performed Overall Cognitive Status: Within Functional Limits for tasks assessed                                        Exercises      General Comments        Pertinent Vitals/Pain Pain  Assessment: No/denies pain    Home Living                      Prior Function            PT Goals (current goals can now be found in the care plan section) Progress towards PT goals: Goals met/education completed, patient discharged from PT    Frequency    Min 3X/week      PT Plan Current plan remains appropriate    Co-evaluation              AM-PAC PT "6 Clicks" Mobility   Outcome Measure  Help needed turning from your back to your side while in a flat bed without using bedrails?: None Help needed moving from lying on your back to sitting on the side of a flat bed without using bedrails?: None Help needed moving to and from a bed to a chair (including a wheelchair)?: A Little Help needed standing up from a chair using your arms (e.g., wheelchair or bedside chair)?: A Little Help needed to walk in hospital room?:  A Little Help needed climbing 3-5 steps with a railing? : A Little 6 Click Score: 20    End of Session   Activity Tolerance: Patient tolerated treatment well Patient left: in bed;with call bell/phone within reach;with family/visitor present   PT Visit Diagnosis: Unsteadiness on feet (R26.81);Muscle weakness (generalized) (M62.81)     Time: 1010-1019 PT Time Calculation (min) (ACUTE ONLY): 9 min  Charges:  $Gait Training: 8-22 mins                    Arlyce Dice, DPT Acute Rehabilitation Services Pager: [phone] Office: [phone]  York Ram E 06/24/2019, 1:04 PM

## 2019-06-24 NOTE — Progress Notes (Addendum)
Progress Note  Patient Name: Natalie Baker Date of Encounter: 06/24/2019  [facility] Cardiologist: Elouise Munroe, MD   Subjective   Breathing better today Had palpitations yesterday, got a little light-headed, has had these at home.  Inpatient Medications    Scheduled Meds: . carvedilol  3.125 mg Oral BID  . Chlorhexidine Gluconate Cloth  6 each Topical Daily  . enoxaparin (LOVENOX) injection  40 mg Subcutaneous Q24H  . furosemide  40 mg Intravenous BID  . potassium chloride  40 mEq Oral Q4H  . progesterone  100 mg Oral Daily  . thyroid  90 mg Oral QAC breakfast   Continuous Infusions: . piperacillin-tazobactam (ZOSYN)  IV 3.375 g (06/24/19 0322)   PRN Meds: acetaminophen **OR** acetaminophen, morphine injection, ondansetron **OR** ondansetron (ZOFRAN) IV, ondansetron (ZOFRAN) IV, polyethylene glycol, sodium chloride flush   Vital Signs    Vitals:   06/24/19 0152 06/24/19 0524 06/24/19 0800 06/24/19 1000  BP: (!) 97/59 96/61 97/73    Pulse: 100 (!) 116 (!) 108   Resp: 18 17    Temp: 98.2 F (36.8 C) 97.6 F (36.4 C) 97.7 F (36.5 C)   TempSrc: Oral Oral Oral   SpO2: 100% 96% 99%   Weight:    95 kg  Height:        Intake/Output Summary (Last 24 hours) at 06/24/2019 1009 Last data filed at 06/24/2019 0521 Gross per 24 hour  Intake 590.21 ml  Output 3200 ml  Net -2609.79 ml   Last 3 Weights 06/24/2019 06/23/2019 06/22/2019  Weight (lbs) 209 lb 6.4 oz 212 lb 3.2 oz 203 lb 14.8 oz  Weight (kg) 94.983 kg 96.253 kg 92.5 kg      Telemetry    ST, 3 bt NSVT, 1" NSVT  - Personally Reviewed  ECG    At 0239  06/23/19  ST at 120 non specific ST abnormality  - Personally Reviewed  Physical Exam   GEN: No acute distress.   Neck: mild JVD Cardiac: RRR, no murmur, no rubs, or gallops.  Respiratory: diminished to auscultation bilaterally GI: Soft, nontender, non-distended  MS: No edema; No deformity. Neuro:  Nonfocal  Psych: Normal affect   Labs     High Sensitivity Troponin:   Recent Labs  Lab 06/21/19 1255 06/21/19 1721 06/22/19 1116  TROPONINIHS 71* 90* 69*      Chemistry Recent Labs  Lab 06/22/19 0449 06/23/19 0332 06/24/19 0500  NA 136 137 137  K 5.0 3.7 3.0*  CL 106 107 98  CO2 20* 20* 27  GLUCOSE 130* 89 96  BUN 19 18 19   CREATININE 1.06* 0.98 1.14*  CALCIUM 8.1* 8.0* 8.1*  PROT 6.1* 5.5* 5.7*  ALBUMIN 3.8 3.4* 3.5  AST 193* 293* 128*  ALT 213* 320* 256*  ALKPHOS 70 64 73  BILITOT 0.8 0.6 0.7  GFRNONAA 56* >60 51*  GFRAA >60 >60 59*  ANIONGAP 10 10 12      Hematology Recent Labs  Lab 06/22/19 0449 06/23/19 0332 06/24/19 0500  WBC 9.3 8.0 7.5  RBC 4.40 4.09 3.92  HGB 12.8 11.7* 11.4*  HCT 41.6 37.8 36.0  MCV 94.5 92.4 91.8  MCH 29.1 28.6 29.1  MCHC 30.8 31.0 31.7  RDW 14.9 15.1 14.9  PLT 266 208 181    BNP Recent Labs  Lab 06/23/19 0332  BNP 1,170.4*     DDimer  Recent Labs  Lab 06/21/19 1255  DDIMER 1.35*    Lab Results  Component Value Date  TSH 5.001 (H) 06/21/2019    Radiology    ECHOCARDIOGRAM COMPLETE  Result Date: 06/22/2019    ECHOCARDIOGRAM REPORT   Patient Name:   Natalie Baker Date of Exam: 06/22/2019 Medical Rec #:  [id number]        Height:       62.0 in Accession #:    [id number]       Weight:       203.9 lb Date of Birth:  01-Apr-1955       BSA:          1.928 m Patient Age:    46 years         BP:           107/91 mmHg Patient Gender: F                HR:           128 bpm. Exam Location:  Inpatient Procedure: 2D Echo, Cardiac Doppler, Color Doppler and Intracardiac            Opacification Agent Indications:    Elevated Troponin  History:        Patient has prior history of Echocardiogram examinations, most                 recent 01/22/2019. Risk Factors:Dyslipidemia. Cancer.                 Hypothyroidism.  Sonographer:    Jonelle Sidle Dance Referring Phys: Jennings  1. There is no left ventricular thrombus. Left ventricular ejection fraction, by  estimation, is 20 to 25%. The left ventricle has severely decreased function. The left ventricle demonstrates global hypokinesis. Indeterminate diastolic filling due to E-A  fusion.  2. Right ventricular systolic function is moderately reduced. The right ventricular size is mildly enlarged. There is normal pulmonary artery systolic pressure. The estimated right ventricular systolic pressure is 16.0 mmHg.  3. Left atrial size was mild to moderately dilated.  4. The pericardial effusion is circumferential. Moderate pleural effusion in the left lateral region.  5. The mitral valve is normal in structure. Moderate to severe mitral valve regurgitation. No evidence of mitral stenosis.  6. Tricuspid valve regurgitation is severe.  7. The aortic valve is tricuspid. Aortic valve regurgitation is not visualized. No aortic stenosis is present.  8. The inferior vena cava is dilated in size with <50% respiratory variability, suggesting right atrial pressure of 15 mmHg. Comparison(s): Changes from prior study are noted. Compared to 01/22/2019, there is a marked reduction in left ventricular systolic function, moderate reduction in right ventricular systolic function, there is new moderate to severe mitral insufficiency (likely secondary/functional) and new severe tricuspid insufficiency. FINDINGS  Left Ventricle: There is no left ventricular thrombus. Left ventricular ejection fraction, by estimation, is 20 to 25%. The left ventricle has severely decreased function. The left ventricle demonstrates global hypokinesis. Definity contrast agent was given IV to delineate the left ventricular endocardial borders. The left ventricular internal cavity size was normal in size. There is no left ventricular hypertrophy. Indeterminate diastolic filling due to E-A fusion. Right Ventricle: The right ventricular size is mildly enlarged. No increase in right ventricular wall thickness. Right ventricular systolic function is moderately reduced.  There is normal pulmonary artery systolic pressure. The tricuspid regurgitant velocity is 1.85 m/s, and with an assumed right atrial pressure of 15 mmHg, the estimated right ventricular systolic pressure is 73.7 mmHg. Left Atrium: Left atrial size was mild to moderately  dilated. Right Atrium: Right atrial size was normal in size. Pericardium: Trivial pericardial effusion is present. The pericardial effusion is circumferential. Mitral Valve: The mitral valve is normal in structure. There is mild thickening of the mitral valve leaflet(s). Mild mitral annular calcification. Moderate to severe mitral valve regurgitation, with centrally-directed jet. No evidence of mitral valve stenosis. Tricuspid Valve: The tricuspid valve is normal in structure. Tricuspid valve regurgitation is severe. The flow in the hepatic veins is reversed during ventricular systole. Aortic Valve: The aortic valve is tricuspid. Aortic valve regurgitation is not visualized. No aortic stenosis is present. Pulmonic Valve: The pulmonic valve was not well visualized. Pulmonic valve regurgitation is not visualized. Aorta: The aortic root and ascending aorta are structurally normal, with no evidence of dilitation. Venous: The inferior vena cava is dilated in size with less than 50% respiratory variability, suggesting right atrial pressure of 15 mmHg. IAS/Shunts: No atrial level shunt detected by color flow Doppler. Additional Comments: There is a moderate pleural effusion in the left lateral region.  LEFT VENTRICLE PLAX 2D LVIDd:         5.10 cm LVIDs:         4.20 cm LV PW:         1.10 cm LV IVS:        1.00 cm LVOT diam:     1.80 cm LV SV:         18 LV SV Index:   9 LVOT Area:     2.54 cm  RIGHT VENTRICLE            IVC RV Basal diam:  2.90 cm    IVC diam: 1.80 cm RV S prime:     5.22 cm/s TAPSE (M-mode): 1.4 cm LEFT ATRIUM             Index       RIGHT ATRIUM           Index LA diam:        4.30 cm 2.23 cm/m  RA Area:     14.30 cm LA Vol (A2C):    77.2 ml 40.05 ml/m RA Volume:   30.90 ml  16.03 ml/m LA Vol (A4C):   31.0 ml 16.08 ml/m LA Biplane Vol: 48.8 ml 25.32 ml/m  AORTIC VALVE LVOT Vmax:   57.30 cm/s LVOT Vmean:  32.700 cm/s LVOT VTI:    0.072 m  AORTA Ao Root diam: 3.10 cm Ao Asc diam:  3.20 cm MITRAL VALVE                TRICUSPID VALVE MV Area (PHT): 5.07 cm     TR Peak grad:   13.6 mmHg MV Decel Time: 150 msec     TR Vmax:        184.60 cm/s MV E velocity: 122.00 cm/s                             SHUNTS                             Systemic VTI:  0.07 m                             Systemic Diam: 1.80 cm Dani Gobble Croitoru MD Electronically signed by Sanda Klein MD Signature Date/Time: 06/22/2019/10:45:23 AM    Final    US  Abdomen Limited RUQ  Result Date: 06/22/2019 CLINICAL DATA:  Elevated liver function tests. EXAM: ULTRASOUND ABDOMEN LIMITED RIGHT UPPER QUADRANT COMPARISON:  CT chest 06/21/2019. CT chest, abdomen and pelvis 03/01/2019. FINDINGS: Gallbladder: Stones are seen in the gallbladder measuring up to 1.5 cm. There is pericholecystic fluid. The gallbladder wall is thickened at 0.4 cm. Common bile duct: Diameter: 0.3 cm Liver: No focal lesion identified. Within normal limits in parenchymal echogenicity. Portal vein is patent on color Doppler imaging with normal direction of blood flow towards the liver. Other: Small right pleural effusion is noted. IMPRESSION: Gallstones with pericholecystic fluid and wall thickening compatible with acute cholecystitis. Small appearing right pleural effusion. Electronically Signed   By: Inge Rise M.D.   On: 06/22/2019 15:23    Cardiac Studies   Echo 06/22/19 IMPRESSIONS  1. There is no left ventricular thrombus. Left ventricular ejection  fraction, by estimation, is 20 to 25%. The left ventricle has severely  decreased function. The left ventricle demonstrates global hypokinesis.  Indeterminate diastolic filling due to E-A  fusion.  2. Right ventricular systolic function is  moderately reduced. The right  ventricular size is mildly enlarged. There is normal pulmonary artery  systolic pressure. The estimated right ventricular systolic pressure is  70.6 mmHg.  3. Left atrial size was mild to moderately dilated.  4. The pericardial effusion is circumferential. Moderate pleural effusion  in the left lateral region.  5. The mitral valve is normal in structure. Moderate to severe mitral  valve regurgitation. No evidence of mitral stenosis.  6. Tricuspid valve regurgitation is severe.  7. The aortic valve is tricuspid. Aortic valve regurgitation is not  visualized. No aortic stenosis is present.  8. The inferior vena cava is dilated in size with <50% respiratory  variability, suggesting right atrial pressure of 15 mmHg.   Comparison(s): Changes from prior study are noted. Compared to 01/22/2019,  there is a marked reduction in left ventricular systolic function,  moderate reduction in right ventricular systolic function, there is new  moderate to severe mitral insufficiency  (likely secondary/functional) and new severe tricuspid insufficiency.   FINDINGS  Left Ventricle: There is no left ventricular thrombus. Left ventricular  ejection fraction, by estimation, is 20 to 25%. The left ventricle has  severely decreased function. The left ventricle demonstrates global  hypokinesis. Definity contrast agent was  given IV to delineate the left ventricular endocardial borders. The left  ventricular internal cavity size was normal in size. There is no left  ventricular hypertrophy. Indeterminate diastolic filling due to E-A  fusion.   Right Ventricle: The right ventricular size is mildly enlarged. No  increase in right ventricular wall thickness. Right ventricular systolic  function is moderately reduced. There is normal pulmonary artery systolic  pressure. The tricuspid regurgitant  velocity is 1.85 m/s, and with an assumed right atrial pressure of 15  mmHg,  the estimated right ventricular systolic pressure is 23.7 mmHg.   Left Atrium: Left atrial size was mild to moderately dilated.   Right Atrium: Right atrial size was normal in size.   Pericardium: Trivial pericardial effusion is present. The pericardial  effusion is circumferential.   Mitral Valve: The mitral valve is normal in structure. There is mild  thickening of the mitral valve leaflet(s). Mild mitral annular  calcification. Moderate to severe mitral valve regurgitation, with  centrally-directed jet. No evidence of mitral valve  stenosis.   Tricuspid Valve: The tricuspid valve is normal in structure. Tricuspid  valve regurgitation is severe. The  flow in the hepatic veins is reversed  during ventricular systole.   Aortic Valve: The aortic valve is tricuspid. Aortic valve regurgitation is  not visualized. No aortic stenosis is present.   Pulmonic Valve: The pulmonic valve was not well visualized. Pulmonic valve  regurgitation is not visualized.   Aorta: The aortic root and ascending aorta are structurally normal, with  no evidence of dilitation.   ABD ultrasound IMPRESSION: Gallstones with pericholecystic fluid and wall thickening compatible with acute cholecystitis.  Small appearing right pleural effusion.   Patient Profile     64 y.o. female with a hx of palpitations thought to be PVCs, HTN, non hodgkins lymphoma last treatment 5 weeks ago, presented with SOB, abd pain and vomiting and in sinus tachycardia and new CM with EF 20-25% on Echo along with RV function moderately reduced.  +MR and TR.  Also with acute chole.  Assessment & Plan    Sinus tach  - in setting of acute illness - BB as BP will tolerate, however SBP 90s on Coreg 3.125 mg bid  New Cardiomyopathy at 20-25% and moderately reduced RV function. 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   - LFTs are elevated but starting to trend down - [facility] has seen, rec ABX for now - if not able to give anesthesia, would get perc tube from IR  Stage IV non-Hodgkin's lymphoma, mantle cell lymphoma   - s/p R-CHOP w/ Neulasta - per Dr Earlie Server, conservative mgt for now  UTI   - per IM  Elevated ddimer  - No PE   For questions or updates, please contact [facility] Please consult [email or web address] for contact info under      Signed, Rosaria Ferries, PA-C  06/24/2019, 10:09 AM    Patient seen and examined with R Barrett PA-C.  Agree as above, with the following exceptions and changes as noted below. She had 1 minute of VT at 6:15 am, concerning in setting of cardiomyopathy. She has had symptomatic palpitations and continued ectopy. She has diuresed less today but remains tachycardic, suggesting she is unable to diuresis further without support. She is off of oxygen and was able to ambulate well today, which is positive.   . Gen: NAD, CV: tachy, no murmurs, Lungs: clear, Abd: soft TTP, Extrem: Warm, well perfused, no edema, Neuro/Psych: alert and oriented x 3, normal mood and affect. All available labs, radiology testing, previous records reviewed.   I am very concerned about VT in the setting of cardiomyopathy and I have shared this with the patient. We have discussed R/LHC, and participated in shared decision making. We will attempt to coordinate today. I have informed the [facility] service of her transfer to [facility] in  the event she decompensates and requires inotropes. She did not diurese as well yesterday and remains tachycardic,  we may need support to diurese further. Extremities are cool today. Transfer to [facility] for continued management.   INFORMED CONSENT: I have reviewed the risks, indications, and alternatives to cardiac catheterization, possible angioplasty, and stenting with the patient. Risks include but are not limited to bleeding, infection, vascular injury, stroke, myocardial infection, arrhythmia, kidney injury, radiation-related injury in the case of prolonged fluoroscopy use, emergency cardiac surgery, and death. The patient understands the risks of serious complication is 1-2 in 9150 with diagnostic cardiac cath and 1-2% or less with angioplasty/stenting.    Elouise Munroe, MD 06/24/19

## 2019-06-25 ENCOUNTER — Encounter (HOSPITAL_COMMUNITY)
Disposition: A | Discharge status: Home Health | Payer: Self-pay | Source: Home / Self Care | Attending: Internal Medicine

## 2019-06-25 ENCOUNTER — Ambulatory Visit (HOSPITAL_COMMUNITY)
Admission: RE | Admit: 2019-06-25 | Payer: BC Managed Care – PPO | Source: Home / Self Care | Admitting: Internal Medicine

## 2019-06-25 ENCOUNTER — Inpatient Hospital Stay (HOSPITAL_COMMUNITY): Payer: BC Managed Care – PPO

## 2019-06-25 DIAGNOSIS — C8310 Mantle cell lymphoma, unspecified site: Secondary | ICD-10-CM

## 2019-06-25 DIAGNOSIS — I472 Ventricular tachycardia: Secondary | ICD-10-CM

## 2019-06-25 DIAGNOSIS — R7989 Other specified abnormal findings of blood chemistry: Secondary | ICD-10-CM

## 2019-06-25 HISTORY — PX: RIGHT/LEFT HEART CATH AND CORONARY ANGIOGRAPHY: CATH118266

## 2019-06-25 LAB — POCT I-STAT 7, (LYTES, BLD GAS, ICA,H+H)
Acid-Base Excess: 2 mmol/L (ref 0.0–2.0)
Bicarbonate: 26.7 mmol/L (ref 20.0–28.0)
Calcium, Ion: 0.94 mmol/L — ABNORMAL LOW (ref 1.15–1.40)
HCT: 35 % — ABNORMAL LOW (ref 36.0–46.0)
Hemoglobin: 11.9 g/dL — ABNORMAL LOW (ref 12.0–15.0)
O2 Saturation: 100 %
Potassium: 3 mmol/L — ABNORMAL LOW (ref 3.5–5.1)
Sodium: 143 mmol/L (ref 135–145)
TCO2: 28 mmol/L (ref 22–32)
pCO2 arterial: 39.1 mmHg (ref 32.0–48.0)
pH, Arterial: 7.441 (ref 7.350–7.450)
pO2, Arterial: 175 mmHg — ABNORMAL HIGH (ref 83.0–108.0)

## 2019-06-25 LAB — CBC WITH DIFFERENTIAL/PLATELET
Abs Immature Granulocytes: 0.02 10*3/uL (ref 0.00–0.07)
Basophils Absolute: 0.1 10*3/uL (ref 0.0–0.1)
Basophils Relative: 1 %
Eosinophils Absolute: 0.1 10*3/uL (ref 0.0–0.5)
Eosinophils Relative: 2 %
HCT: 36.7 % (ref 36.0–46.0)
Hemoglobin: 11.6 g/dL — ABNORMAL LOW (ref 12.0–15.0)
Immature Granulocytes: 0 %
Lymphocytes Relative: 7 %
Lymphs Abs: 0.5 10*3/uL — ABNORMAL LOW (ref 0.7–4.0)
MCH: 28.6 pg (ref 26.0–34.0)
MCHC: 31.6 g/dL (ref 30.0–36.0)
MCV: 90.4 fL (ref 80.0–100.0)
Monocytes Absolute: 0.9 10*3/uL (ref 0.1–1.0)
Monocytes Relative: 13 %
Neutro Abs: 5.7 10*3/uL (ref 1.7–7.7)
Neutrophils Relative %: 77 %
Platelets: 193 10*3/uL (ref 150–400)
RBC: 4.06 MIL/uL (ref 3.87–5.11)
RDW: 14.7 % (ref 11.5–15.5)
WBC: 7.4 10*3/uL (ref 4.0–10.5)
nRBC: 0.4 % — ABNORMAL HIGH (ref 0.0–0.2)

## 2019-06-25 LAB — POCT I-STAT EG7
Acid-Base Excess: 5 mmol/L — ABNORMAL HIGH (ref 0.0–2.0)
Acid-Base Excess: 6 mmol/L — ABNORMAL HIGH (ref 0.0–2.0)
Bicarbonate: 31.3 mmol/L — ABNORMAL HIGH (ref 20.0–28.0)
Bicarbonate: 31.9 mmol/L — ABNORMAL HIGH (ref 20.0–28.0)
Calcium, Ion: 1.15 mmol/L (ref 1.15–1.40)
Calcium, Ion: 1.16 mmol/L (ref 1.15–1.40)
HCT: 38 % (ref 36.0–46.0)
HCT: 38 % (ref 36.0–46.0)
Hemoglobin: 12.9 g/dL (ref 12.0–15.0)
Hemoglobin: 12.9 g/dL (ref 12.0–15.0)
O2 Saturation: 49 %
O2 Saturation: 53 %
Potassium: 3.5 mmol/L (ref 3.5–5.1)
Potassium: 3.5 mmol/L (ref 3.5–5.1)
Sodium: 139 mmol/L (ref 135–145)
Sodium: 140 mmol/L (ref 135–145)
TCO2: 33 mmol/L — ABNORMAL HIGH (ref 22–32)
TCO2: 33 mmol/L — ABNORMAL HIGH (ref 22–32)
pCO2, Ven: 50.8 mmHg (ref 44.0–60.0)
pCO2, Ven: 51.9 mmHg (ref 44.0–60.0)
pH, Ven: 7.397 (ref 7.250–7.430)
pH, Ven: 7.397 (ref 7.250–7.430)
pO2, Ven: 27 mmHg — CL (ref 32.0–45.0)
pO2, Ven: 29 mmHg — CL (ref 32.0–45.0)

## 2019-06-25 LAB — COMPREHENSIVE METABOLIC PANEL
ALT: 249 U/L — ABNORMAL HIGH (ref 0–44)
AST: 121 U/L — ABNORMAL HIGH (ref 15–41)
Albumin: 3.3 g/dL — ABNORMAL LOW (ref 3.5–5.0)
Alkaline Phosphatase: 88 U/L (ref 38–126)
Anion gap: 12 (ref 5–15)
BUN: 15 mg/dL (ref 8–23)
CO2: 28 mmol/L (ref 22–32)
Calcium: 8.4 mg/dL — ABNORMAL LOW (ref 8.9–10.3)
Chloride: 97 mmol/L — ABNORMAL LOW (ref 98–111)
Creatinine, Ser: 1.12 mg/dL — ABNORMAL HIGH (ref 0.44–1.00)
GFR calc Af Amer: >60 mL/min (ref >60)
GFR calc non Af Amer: 52 mL/min — ABNORMAL LOW (ref >60)
Glucose, Bld: 103 mg/dL — ABNORMAL HIGH (ref 70–99)
Potassium: 3.5 mmol/L (ref 3.5–5.1)
Sodium: 137 mmol/L (ref 135–145)
Total Bilirubin: 0.6 mg/dL (ref 0.3–1.2)
Total Protein: 5.5 g/dL — ABNORMAL LOW (ref 6.5–8.1)

## 2019-06-25 LAB — COOXEMETRY PANEL
Carboxyhemoglobin: 1.7 % — ABNORMAL HIGH (ref 0.5–1.5)
Methemoglobin: 1 % (ref 0.0–1.5)
O2 Saturation: 66.5 %
Total hemoglobin: 11.9 g/dL — ABNORMAL LOW (ref 12.0–16.0)

## 2019-06-25 LAB — LACTIC ACID, PLASMA: Lactic Acid, Venous: 1.2 mmol/L (ref 0.5–1.9)

## 2019-06-25 LAB — PHOSPHORUS: Phosphorus: 3.8 mg/dL (ref 2.5–4.6)

## 2019-06-25 LAB — T4: T4, Total: 5.3 ug/dL (ref 4.5–12.0)

## 2019-06-25 LAB — MAGNESIUM: Magnesium: 2 mg/dL (ref 1.7–2.4)

## 2019-06-25 SURGERY — RIGHT/LEFT HEART CATH AND CORONARY ANGIOGRAPHY
Anesthesia: LOCAL

## 2019-06-25 MED ORDER — LIDOCAINE HCL (PF) 1 % IJ SOLN
INTRAMUSCULAR | Status: DC | PRN
  Administered 2019-06-25: 2 mL
  Administered 2019-06-25: 5 mL

## 2019-06-25 MED ORDER — IOHEXOL 350 MG/ML SOLN
INTRAVENOUS | Status: DC | PRN
  Administered 2019-06-25: 40 mL via INTRA_ARTERIAL

## 2019-06-25 MED ORDER — HEPARIN SODIUM (PORCINE) 1000 UNIT/ML IJ SOLN
INTRAMUSCULAR | Status: AC
  Filled 2019-06-25: qty 1

## 2019-06-25 MED ORDER — MIDAZOLAM HCL 2 MG/2ML IJ SOLN
INTRAMUSCULAR | Status: AC
  Filled 2019-06-25: qty 2

## 2019-06-25 MED ORDER — MIDAZOLAM HCL 2 MG/2ML IJ SOLN
INTRAMUSCULAR | Status: DC | PRN
  Administered 2019-06-25 (×2): 1 mg via INTRAVENOUS

## 2019-06-25 MED ORDER — SODIUM CHLORIDE 0.9 % IV SOLN: 250.0000 mL | INTRAVENOUS | Status: DC | PRN

## 2019-06-25 MED ORDER — POTASSIUM CHLORIDE CRYS ER 20 MEQ PO TBCR
40.0000 meq | EXTENDED_RELEASE_TABLET | Freq: Once | ORAL | Status: AC
  Administered 2019-06-25: 40 meq via ORAL
  Filled 2019-06-25: qty 2

## 2019-06-25 MED ORDER — VERAPAMIL HCL 2.5 MG/ML IV SOLN
INTRAVENOUS | Status: AC
  Filled 2019-06-25: qty 2

## 2019-06-25 MED ORDER — HEPARIN SODIUM (PORCINE) 1000 UNIT/ML IJ SOLN
INTRAMUSCULAR | Status: DC | PRN
  Administered 2019-06-25: 4500 [IU] via INTRAVENOUS

## 2019-06-25 MED ORDER — SODIUM CHLORIDE 0.9% FLUSH
3.0000 mL | Freq: Two times a day (BID) | INTRAVENOUS | Status: DC
  Administered 2019-06-25 – 2019-07-01 (×10): 3 mL via INTRAVENOUS

## 2019-06-25 MED ORDER — FENTANYL CITRATE (PF) 100 MCG/2ML IJ SOLN
INTRAMUSCULAR | Status: DC | PRN
  Administered 2019-06-25 (×2): 25 ug via INTRAVENOUS

## 2019-06-25 MED ORDER — PANTOPRAZOLE SODIUM 40 MG PO TBEC
40.0000 mg | DELAYED_RELEASE_TABLET | Freq: Every day | ORAL | Status: DC
  Administered 2019-06-25 – 2019-07-02 (×8): 40 mg via ORAL
  Filled 2019-06-25 (×8): qty 1

## 2019-06-25 MED ORDER — LIDOCAINE HCL (PF) 1 % IJ SOLN
INTRAMUSCULAR | Status: AC
  Filled 2019-06-25: qty 30

## 2019-06-25 MED ORDER — SODIUM CHLORIDE 0.9 % IV SOLN
INTRAVENOUS | Status: AC | PRN
  Administered 2019-06-25: 10 mL/h via INTRAVENOUS

## 2019-06-25 MED ORDER — HEPARIN (PORCINE) IN NACL 1000-0.9 UT/500ML-% IV SOLN
INTRAVENOUS | Status: AC
  Filled 2019-06-25: qty 1000

## 2019-06-25 MED ORDER — AMIODARONE HCL IN DEXTROSE 360-4.14 MG/200ML-% IV SOLN
INTRAVENOUS | Status: AC
  Filled 2019-06-25: qty 200

## 2019-06-25 MED ORDER — HEPARIN (PORCINE) IN NACL 1000-0.9 UT/500ML-% IV SOLN
INTRAVENOUS | Status: DC | PRN
  Administered 2019-06-25 (×2): 500 mL

## 2019-06-25 MED ORDER — VERAPAMIL HCL 2.5 MG/ML IV SOLN
INTRAVENOUS | Status: DC | PRN
  Administered 2019-06-25: 10 mL via INTRA_ARTERIAL

## 2019-06-25 MED ORDER — ACETAMINOPHEN 325 MG PO TABS
650.0000 mg | ORAL_TABLET | ORAL | Status: DC | PRN
  Administered 2019-06-26: 650 mg via ORAL
  Filled 2019-06-25: qty 2

## 2019-06-25 MED ORDER — HYDRALAZINE HCL 20 MG/ML IJ SOLN: 10.0000 mg | INTRAMUSCULAR | Status: AC | PRN

## 2019-06-25 MED ORDER — ALUM & MAG HYDROXIDE-SIMETH 200-200-20 MG/5ML PO SUSP: 30.0000 mL | ORAL | Status: DC | PRN

## 2019-06-25 MED ORDER — ONDANSETRON HCL 4 MG/2ML IJ SOLN
4.0000 mg | Freq: Four times a day (QID) | INTRAMUSCULAR | Status: DC | PRN
  Filled 2019-06-25: qty 2

## 2019-06-25 MED ORDER — ENOXAPARIN SODIUM 40 MG/0.4ML ~~LOC~~ SOLN
40.0000 mg | SUBCUTANEOUS | Status: DC
  Administered 2019-06-26 – 2019-07-02 (×7): 40 mg via SUBCUTANEOUS
  Filled 2019-06-25 (×7): qty 0.4

## 2019-06-25 MED ORDER — FENTANYL CITRATE (PF) 100 MCG/2ML IJ SOLN
INTRAMUSCULAR | Status: AC
  Filled 2019-06-25: qty 2

## 2019-06-25 MED ORDER — LABETALOL HCL 5 MG/ML IV SOLN: 10.0000 mg | INTRAVENOUS | Status: AC | PRN

## 2019-06-25 MED ORDER — AMIODARONE HCL IN DEXTROSE 360-4.14 MG/200ML-% IV SOLN
60.0000 mg/h | INTRAVENOUS | Status: AC
  Administered 2019-06-25: 60 mg/h via INTRAVENOUS
  Filled 2019-06-25 (×3): qty 200

## 2019-06-25 MED ORDER — SODIUM CHLORIDE 0.9% FLUSH
3.0000 mL | INTRAVENOUS | Status: DC | PRN
  Administered 2019-06-26: 3 mL via INTRAVENOUS

## 2019-06-25 MED ORDER — AMIODARONE HCL IN DEXTROSE 360-4.14 MG/200ML-% IV SOLN
30.0000 mg/h | INTRAVENOUS | Status: DC
  Administered 2019-06-25 – 2019-06-30 (×9): 30 mg/h via INTRAVENOUS
  Filled 2019-06-25 (×12): qty 200

## 2019-06-25 MED ORDER — MILRINONE LACTATE IN DEXTROSE 20-5 MG/100ML-% IV SOLN
0.2500 ug/kg/min | INTRAVENOUS | Status: DC
  Administered 2019-06-25 – 2019-06-27 (×6): 0.375 ug/kg/min via INTRAVENOUS
  Administered 2019-06-28 (×2): 0.25 ug/kg/min via INTRAVENOUS
  Filled 2019-06-25 (×8): qty 100

## 2019-06-25 MED ORDER — MILRINONE LACTATE IN DEXTROSE 20-5 MG/100ML-% IV SOLN
0.1250 ug/kg/min | INTRAVENOUS | Status: DC
  Administered 2019-06-25: 0.125 ug/kg/min via INTRAVENOUS
  Filled 2019-06-25: qty 100

## 2019-06-25 SURGICAL SUPPLY — 14 items
CATH INFINITI 5FR MULTPACK ANG (CATHETERS) ×1 IMPLANT
CATH LAUNCHER 5F JL3 (CATHETERS) IMPLANT
CATH SWAN GANZ 7F STRAIGHT (CATHETERS) ×1 IMPLANT
CATHETER LAUNCHER 5F JL3 (CATHETERS) ×2
DEVICE RAD COMP TR BAND LRG (VASCULAR PRODUCTS) ×1 IMPLANT
GLIDESHEATH SLEND SS 6F .021 (SHEATH) ×1 IMPLANT
GUIDEWIRE INQWIRE 1.5J.035X260 (WIRE) IMPLANT
INQWIRE 1.5J .035X260CM (WIRE) ×2
KIT HEART LEFT (KITS) ×1 IMPLANT
PACK CARDIAC CATHETERIZATION (CUSTOM PROCEDURE TRAY) ×2 IMPLANT
SHEATH PINNACLE 7F 10CM (SHEATH) ×1 IMPLANT
SHEATH PROBE COVER 6X72 (BAG) ×1 IMPLANT
SLEEVE REPOSITIONING LENGTH 30 (MISCELLANEOUS) ×1 IMPLANT
TRANSDUCER W/STOPCOCK (MISCELLANEOUS) ×2 IMPLANT

## 2019-06-25 NOTE — Progress Notes (Signed)
TR BAND REMOVAL  LOCATION:    Left radial  DEFLATED PER PROTOCOL:    Yes.    TIME BAND OFF / DRESSING APPLIED:    1400 A clean dry dressing applied with gauze and tegaderm   SITE UPON ARRIVAL:    Level 0  SITE AFTER BAND REMOVAL:    Level 0  CIRCULATION SENSATION AND MOVEMENT:    Within Normal Limits   Yes.    COMMENTS:   Care instructions given to pt.

## 2019-06-25 NOTE — Progress Notes (Signed)
Advanced Heart Failure Rounding Note   Subjective:    Lactic acid 2.0 last night. Started on dig and spiro. Carvedilol stopped.   Feels OK but still fatigued. SOB with any activity HR a bit improved down from 130 -> 115-120 range    Objective:   Weight Range:  Vital Signs:   Temp:  [98 F (36.7 C)-98.9 F (37.2 C)] 98.9 F (37.2 C) (06/18 0422) Pulse Rate:  [106-132] 117 (06/18 0700) Resp:  [14-22] 20 (06/18 0700) BP: (91-116)/(47-79) 110/70 (06/18 0700) SpO2:  [95 %-97 %] 97 % (06/18 0422) Weight:  [93 kg-95 kg] 93 kg (06/18 0515) Last BM Date: 06/22/19  Weight change: Filed Weights   06/24/19 1000 06/24/19 1854 06/25/19 0515  Weight: 95 kg 93.4 kg 93 kg    Intake/Output:   Intake/Output Summary (Last 24 hours) at 06/25/2019 0806 Last data filed at 06/25/2019 0637 Gross per 24 hour  Intake 601.3 ml  Output 1350 ml  Net -748.7 ml     Physical Exam: General:  Weak appearing. No resp difficulty HEENT: normal alopecic Neck: supple. JVP to jaw  Carotids 2+ bilat; no bruits. No lymphadenopathy or thryomegaly appreciated. Cor: PMI nondisplaced. Regular tachy + s3 Lungs: clear Abdomen: obese soft, nontender, nondistended. No hepatosplenomegaly. No bruits or masses. Good bowel sounds. Extremities: no cyanosis, clubbing, rash, edema + cool  Neuro: alert & orientedx3, cranial nerves grossly intact. moves all 4 extremities w/o difficulty. Affect pleasant   Telemetry:  Sinus tach 110-120 Personally reviewed  Labs: Basic Metabolic Panel: Recent Labs  Lab 06/22/19 0449 06/22/19 0449 06/23/19 0332 06/23/19 0332 06/24/19 0500 06/24/19 2030 06/25/19 0315  NA 136  --  137  --  137 138 137  K 5.0  --  3.7  --  3.0* 4.0 3.5  CL 106  --  107  --  98 99 97*  CO2 20*  --  20*  --  27 29 28   GLUCOSE 130*  --  89  --  96 123* 103*  BUN 19  --  18  --  19 16 15   CREATININE 1.06*  --  0.98  --  1.14* 1.28* 1.12*  CALCIUM 8.1*   < > 8.0*   < > 8.1* 8.6* 8.4*  MG  --    --  2.0  --  1.8  --  2.0  PHOS  --   --   --   --  3.7  --  3.8   < > = values in this interval not displayed.    Liver Function Tests: Recent Labs  Lab 06/22/19 0449 06/23/19 0332 06/24/19 0500 06/24/19 2030 06/25/19 0315  AST 193* 293* 128* 132* 121*  ALT 213* 320* 256* 273* 249*  ALKPHOS 70 64 73 98 88  BILITOT 0.8 0.6 0.7 0.4 0.6  PROT 6.1* 5.5* 5.7* 6.1* 5.5*  ALBUMIN 3.8 3.4* 3.5 3.6 3.3*   Recent Labs  Lab 06/21/19 1255  LIPASE 21   No results for input(s): AMMONIA in the last 168 hours.  CBC: Recent Labs  Lab 06/21/19 1255 06/22/19 0449 06/23/19 0332 06/24/19 0500 06/25/19 0315  WBC 9.5 9.3 8.0 7.5 7.4  NEUTROABS 7.4  --   --  6.1 5.7  HGB 13.8 12.8 11.7* 11.4* 11.6*  HCT 44.0 41.6 37.8 36.0 36.7  MCV 92.1 94.5 92.4 91.8 90.4  PLT 298 266 208 181 193    Cardiac Enzymes: No results for input(s): CKTOTAL, CKMB, CKMBINDEX, TROPONINI in the last  168 hours.  BNP: BNP (last 3 results) Recent Labs    06/23/19 0332  BNP 1,170.4*    ProBNP (last 3 results) No results for input(s): PROBNP in the last 8760 hours.    Other results:  Imaging:  No results found.   Medications:     Scheduled Medications: . Chlorhexidine Gluconate Cloth  6 each Topical Daily  . digoxin  0.125 mg Oral Daily  . enoxaparin (LOVENOX) injection  40 mg Subcutaneous Q24H  . furosemide  40 mg Intravenous BID  . pantoprazole  40 mg Oral Daily  . progesterone  100 mg Oral Daily  . sodium chloride flush  3 mL Intravenous Q12H  . spironolactone  12.5 mg Oral Daily  . thyroid  90 mg Oral QAC breakfast     Infusions: . sodium chloride    . sodium chloride 10 mL/hr at 06/25/19 0637  . piperacillin-tazobactam (ZOSYN)  IV 12.5 mL/hr at 06/25/19 0422     PRN Medications:  sodium chloride, acetaminophen **OR** acetaminophen, alum & mag hydroxide-simeth, morphine injection, ondansetron **OR** ondansetron (ZOFRAN) IV, ondansetron (ZOFRAN) IV, polyethylene glycol, sodium  chloride flush, sodium chloride flush   Assessment/Progress:   1. Acute systolic HF with cardiogenic shock - Echo EF 20-25%  moderately reduced RV function with severe MR/TR - most likely from R-CHOP - marked sinus with cool extremities suggests low output. Lactic acid last night 2.0 - volume status improved but still elevated - off b-blocker - on digoxin and spiro. Will likely need inotropic support - Plan R/L cath this am   2. VT - keep K> 4.0 Mg > 2.0 - No recurrent VT overnight - low threshold to start amio - will need LifeVest on d/c  3. Rt abd pain with acute cholecystitis. - suspect this is due to RV failure - G[facility] following - continue abx for now. - should improve with Rx of HF  4. Stage IV non-Hodgkin's lymphoma, mantle cell lymphoma - s/p R-CHOP w/ Neulasta - per Dr Earlie Server, conservative mgt for now  5. AKI - likely due to low ouptut - improved   6. Transaminitis - suspect chock liver/RV failure    Length of Stay: 3   Emylee Decelle MD 06/25/2019, 8:06 AM  Advanced Heart Failure Team Pager [phone] (M-F; 7a - 4p)  Please contact [facility] for night-coverage after hours (4p -7a ) and weekends on [email or web address]

## 2019-06-25 NOTE — Progress Notes (Signed)
PROGRESS NOTE    Natalie Baker  YHC:[id number] DOB: 27-Aug-1955 DOA: 06/21/2019 PCP: [name], MD   Brief Narrative: 64 year old with past medical history significant for stage IV non-Hodgkin lymphoma, mantle cell lymphoma, hypertension, hypothyroidism who presents complaining of shortness of breath on exertion and evidence of sinus tachycardia.  She was admitted with persistent tachycardia.  She was found to have acute heart failure exacerbation systolic.  Cardiology was consulted.  Patient was also found to have cholecystitis and surgery has been following.  Plan is for conservative management for cholecystitis.  Patient was transferred to [facility] in 6/17 due to V. tach for right and left heart cath.  Patient underwent right and left heart cath on 6/18 which showed normal coronary arteries, severe nonischemic cardiomyopathy ejection fraction 20%, output and cardiogenic shock.  Patient was transferred to the ICU for inotropes.   Assessment & Plan:   Active Problems:   Hyperlipidemia   Hypothyroidism   Morbid obesity ([place]) BMI 35+ with prediabetes, hyperlipidemia, htn   Prediabetes   Mantle cell lymphoma (HCC)   Tachycardia   Acute systolic CHF (congestive heart failure) (HCC)   Acute cholecystitis   1-Systolic Heart Failure Exacerbation, nonischemic cardiomyopathy, cardiogenic shock: Appreciate Dr. Haroldine Laws assistance. Plan for IV amiodarone, IV Lasix and milrinone.  Holding beta-blocker for now. Echo with ejection fraction 20 to 25%, severe TR moderate to severe MR.  Nonsustained V. Tach: Started on amiodarone  Severe TR/moderate to severe MR: Per cardiology  Biliary colic /elevated liver enzymes Surgery recommending conservative management. Will require elective cholecystectomy as an outpatient. Her presentation may be related to heart failure. Dr. Donne Hazel with surgery, he does not think that patient has acute cholecystitis, may be biliary colic.  He  recommend short course of 5 days of antibiotics.  Stage IV non-Hodgkin lymphoma, mantle cell lymphoma, Status post R-CHOP with Neulasta Follow with Dr. Earlie Server, conservative management for now.  AKI; likely due to decreased cardiac output  Acute hypoxic respiratory failure: Secondary to acute systolic heart failure exacerbation Continue with IV [place] Oxygen supplementation.  Hypothyroidism: Continue with Synthroid  Prediabetes: Diet controlled Hormone deficiency status post bilateral hysterectomy with bilateral oophorectomy On progesterone   Estimated body mass index is 37.5 kg/m as calculated from the following:   Height as of this encounter: 5\' 2"  (1.575 m).   Weight as of this encounter: 93 kg.   DVT prophylaxis: Lovenox Code Status: Full code Family Communication: Care discussed with patient, cardiology updated daughter Disposition Plan:  Status is: Inpatient  Remains inpatient appropriate because:Hemodynamically unstable   Dispo: The patient is from: Home              Anticipated d/c is to: Home              Anticipated d/c date is: 3 days              Patient currently is not medically stable to d/c.        Consultants:   Cardiology  Surgery  Procedures:  Cath Ultrasound: Gallstones with pericholecystic fluid and wall thickening compatible with acute cholecystitis Antimicrobials:  Zosyn 6-15---  Subjective: Patient seen holding area in the Cath Lab.  She denies chest pain.  She appears comfortable.  Denies worsening shortness of breath.  Objective: Vitals:   06/25/19 1200 06/25/19 1205 06/25/19 1210 06/25/19 1230  BP: 116/62 (!) 105/54 111/66   Pulse: (!) 104 (!) 107 (!) 101   Resp: 19 17 16 16   Temp:  TempSrc:      SpO2: 96% 96% 97%   Weight:      Height:        Intake/Output Summary (Last 24 hours) at 06/25/2019 1327 Last data filed at 06/25/2019 0811 Gross per 24 hour  Intake 604.3 ml  Output 650 ml  Net -45.7 ml   Filed  Weights   06/24/19 1000 06/24/19 1854 06/25/19 0515  Weight: 95 kg 93.4 kg 93 kg    Examination:  General exam: Appears calm and comfortable  Respiratory system: Bilateral crackles Cardiovascular system: S1 & S2 heard, RRR.  Gastrointestinal system: Abdomen is nondistended, soft and nontender. No organomegaly or masses felt. Normal bowel sounds heard. Central nervous system: Alert and oriented.  Extremities: Symmetric 5 x 5 power. Skin: No rashes, lesions or ulcers    Data Reviewed: I have personally reviewed following labs and imaging studies  CBC: Recent Labs  Lab 06/21/19 1255 06/22/19 0449 06/23/19 0332 06/24/19 0500 06/25/19 0315  WBC 9.5 9.3 8.0 7.5 7.4  NEUTROABS 7.4  --   --  6.1 5.7  HGB 13.8 12.8 11.7* 11.4* 11.6*  HCT 44.0 41.6 37.8 36.0 36.7  MCV 92.1 94.5 92.4 91.8 90.4  PLT 298 266 208 181 151   Basic Metabolic Panel: Recent Labs  Lab 06/22/19 0449 06/23/19 0332 06/24/19 0500 06/24/19 2030 06/25/19 0315  NA 136 137 137 138 137  K 5.0 3.7 3.0* 4.0 3.5  CL 106 107 98 99 97*  CO2 20* 20* 27 29 28   GLUCOSE 130* 89 96 123* 103*  BUN 19 18 19 16 15   CREATININE 1.06* 0.98 1.14* 1.28* 1.12*  CALCIUM 8.1* 8.0* 8.1* 8.6* 8.4*  MG  --  2.0 1.8  --  2.0  PHOS  --   --  3.7  --  3.8   GFR: Estimated Creatinine Clearance: 54.6 mL/min (A) (by C-G formula based on SCr of 1.12 mg/dL (H)). Liver Function Tests: Recent Labs  Lab 06/22/19 0449 06/23/19 0332 06/24/19 0500 06/24/19 2030 06/25/19 0315  AST 193* 293* 128* 132* 121*  ALT 213* 320* 256* 273* 249*  ALKPHOS 70 64 73 98 88  BILITOT 0.8 0.6 0.7 0.4 0.6  PROT 6.1* 5.5* 5.7* 6.1* 5.5*  ALBUMIN 3.8 3.4* 3.5 3.6 3.3*   Recent Labs  Lab 06/21/19 1255  LIPASE 21   No results for input(s): AMMONIA in the last 168 hours. Coagulation Profile: No results for input(s): INR, PROTIME in the last 168 hours. Cardiac Enzymes: No results for input(s): CKTOTAL, CKMB, CKMBINDEX, TROPONINI in the last 168  hours. BNP (last 3 results) No results for input(s): PROBNP in the last 8760 hours. HbA1C: No results for input(s): HGBA1C in the last 72 hours. CBG: No results for input(s): GLUCAP in the last 168 hours. Lipid Profile: No results for input(s): CHOL, HDL, LDLCALC, TRIG, CHOLHDL, LDLDIRECT in the last 72 hours. Thyroid Function Tests: Recent Labs    06/24/19 0500  T4TOTAL 5.3   Anemia Panel: No results for input(s): VITAMINB12, FOLATE, FERRITIN, TIBC, IRON, RETICCTPCT in the last 72 hours. Sepsis Labs: Recent Labs  Lab 06/24/19 1956 06/25/19 0105  LATICACIDVEN 2.0* 1.2    Recent Results (from the past 240 hour(s))  SARS Coronavirus 2 by RT PCR (hospital order, performed in [facility] hospital lab) Nasopharyngeal Nasopharyngeal Swab     Status: None   Collection Time: 06/21/19  5:21 PM   Specimen: Nasopharyngeal Swab  Result Value Ref Range Status   SARS Coronavirus 2 NEGATIVE  NEGATIVE Final    Comment: (NOTE) SARS-CoV-2 target nucleic acids are NOT DETECTED.  The SARS-CoV-2 RNA is generally detectable in upper and lower respiratory specimens during the acute phase of infection. The lowest concentration of SARS-CoV-2 viral copies this assay can detect is 250 copies / mL. A negative result does not preclude SARS-CoV-2 infection and should not be used as the sole basis for treatment or other patient management decisions.  A negative result may occur with improper specimen collection / handling, submission of specimen other than nasopharyngeal swab, presence of viral mutation(s) within the areas targeted by this assay, and inadequate number of viral copies (<250 copies / mL). A negative result must be combined with clinical observations, patient history, and epidemiological information.  Fact Sheet for Patients:   StrictlyIdeas.no  Fact Sheet for Healthcare Providers: BankingDealers.co.za  This test is not yet approved or   cleared by the [place] FDA and has been authorized for detection and/or diagnosis of SARS-CoV-2 by FDA under an Emergency Use Authorization (EUA).  This EUA will remain in effect (meaning this test can be used) for the duration of the COVID-19 declaration under Section 564(b)(1) of the Act, 21 U.S.C. section 360bbb-3(b)(1), unless the authorization is terminated or revoked sooner.  Performed at [facility], [place] [address] [place], [place] [address]   Surgical pcr screen     Status: None   Collection Time: 06/24/19  7:42 PM   Specimen: Nasal Mucosa; Nasal Swab  Result Value Ref Range Status   MRSA, PCR NEGATIVE NEGATIVE Final   Staphylococcus aureus NEGATIVE NEGATIVE Final    Comment: (NOTE) The Xpert SA Assay (FDA approved for NASAL specimens in patients 76 years of age and older), is one component of a comprehensive surveillance program. It is not intended to diagnose infection nor to guide or monitor treatment. Performed at [facility], [place] [address], [place], [place] [address]          Radiology Studies: CARDIAC CATHETERIZATION  Result Date: 06/25/2019 Findings: Ao = 109/71 (81) LV =  99/23 RA =  15 RV =  43/18 PA =  35/14 (25) PCW = 22 Fick cardiac output/index = 3.3/1.7 PVR = < 1.0 WU Ao sat = 100% PA sat = 49%, 53% Assessment: 1. Normal coronary arteries 2. Severe NICM EF 20% 3. Elevated filling pressures and low CO c/w cardiogenic shock 4. Brief run of VT in cath lab Plan/Discussion: Move to ICU. Leave swan in. Start milrinone and IV amio. Continue diuresis. Glori Bickers, MD 11:26 AM       Scheduled Meds: . [MAR Hold] Chlorhexidine Gluconate Cloth  6 each Topical Daily  . [MAR Hold] digoxin  0.125 mg Oral Daily  . [MAR Hold] enoxaparin (LOVENOX) injection  40 mg Subcutaneous Q24H  . [MAR Hold] furosemide  40 mg Intravenous BID  . [MAR Hold] pantoprazole  40 mg Oral Daily  . [MAR Hold] progesterone  100 mg Oral Daily  . [MAR  Hold] sodium chloride flush  3 mL Intravenous Q12H  . [MAR Hold] spironolactone  12.5 mg Oral Daily  . [MAR Hold] thyroid  90 mg Oral QAC breakfast   Continuous Infusions: . sodium chloride    . sodium chloride 10 mL/hr at 06/25/19 0637  . sodium chloride    . amiodarone 60 mg/hr (06/25/19 1310)   Followed by  . amiodarone    . milrinone    . [MAR Hold] piperacillin-tazobactam (ZOSYN)  IV 12.5 mL/hr at 06/25/19  0422     LOS: 3 days    Time spent: 35 minutes.     Elmarie Shiley, MD [facility]   If 7PM-7AM, please contact night-coverage [email or web address]  06/25/2019, 1:27 PM

## 2019-06-25 NOTE — Interval H&P Note (Signed)
History and Physical Interval Note:  06/25/2019 9:39 AM  Natalie Baker  has presented today for surgery, with the diagnosis of cardiomyopathy.  The various methods of treatment have been discussed with the patient and family. After consideration of risks, benefits and other options for treatment, the patient has consented to  Procedure(s): RIGHT/LEFT HEART CATH AND CORONARY ANGIOGRAPHY (N/A) and possible coronary angioplasty as a surgical intervention.  The patient's history has been reviewed, patient examined, no change in status, stable for surgery.  I have reviewed the patient's chart and labs.  Questions were answered to the patient's satisfaction.     Halley Kincer

## 2019-06-25 NOTE — Progress Notes (Signed)
   Patient back to ICU from cath lab.   On milrinone 0.125 Thermo CI 1.25  Poor pulsativity on PA waveform. Pa cath flush and position confirmed. Catheter in appropriate position just low RV pulsativity.   Co-ox sent.  Milrinone increased to 0.375. Watch for increased ectopy. Continue amio.   CCT 35 mins  Glori Bickers, MD  6:36 PM

## 2019-06-25 NOTE — H&P (View-Only) (Signed)
Advanced Heart Failure Rounding Note   Subjective:    Lactic acid 2.0 last night. Started on dig and spiro. Carvedilol stopped.   Feels OK but still fatigued. SOB with any activity HR a bit improved down from 130 -> 115-120 range    Objective:   Weight Range:  Vital Signs:   Temp:  [98 F (36.7 C)-98.9 F (37.2 C)] 98.9 F (37.2 C) (06/18 0422) Pulse Rate:  [106-132] 117 (06/18 0700) Resp:  [14-22] 20 (06/18 0700) BP: (91-116)/(47-79) 110/70 (06/18 0700) SpO2:  [95 %-97 %] 97 % (06/18 0422) Weight:  [93 kg-95 kg] 93 kg (06/18 0515) Last BM Date: 06/22/19  Weight change: Filed Weights   06/24/19 1000 06/24/19 1854 06/25/19 0515  Weight: 95 kg 93.4 kg 93 kg    Intake/Output:   Intake/Output Summary (Last 24 hours) at 06/25/2019 0806 Last data filed at 06/25/2019 0637 Gross per 24 hour  Intake 601.3 ml  Output 1350 ml  Net -748.7 ml     Physical Exam: General:  Weak appearing. No resp difficulty HEENT: normal alopecic Neck: supple. JVP to jaw  Carotids 2+ bilat; no bruits. No lymphadenopathy or thryomegaly appreciated. Cor: PMI nondisplaced. Regular tachy + s3 Lungs: clear Abdomen: obese soft, nontender, nondistended. No hepatosplenomegaly. No bruits or masses. Good bowel sounds. Extremities: no cyanosis, clubbing, rash, edema + cool  Neuro: alert & orientedx3, cranial nerves grossly intact. moves all 4 extremities w/o difficulty. Affect pleasant   Telemetry:  Sinus tach 110-120 Personally reviewed  Labs: Basic Metabolic Panel: Recent Labs  Lab 06/22/19 0449 06/22/19 0449 06/23/19 0332 06/23/19 0332 06/24/19 0500 06/24/19 2030 06/25/19 0315  NA 136  --  137  --  137 138 137  K 5.0  --  3.7  --  3.0* 4.0 3.5  CL 106  --  107  --  98 99 97*  CO2 20*  --  20*  --  27 29 28   GLUCOSE 130*  --  89  --  96 123* 103*  BUN 19  --  18  --  19 16 15   CREATININE 1.06*  --  0.98  --  1.14* 1.28* 1.12*  CALCIUM 8.1*   < > 8.0*   < > 8.1* 8.6* 8.4*  MG  --    --  2.0  --  1.8  --  2.0  PHOS  --   --   --   --  3.7  --  3.8   < > = values in this interval not displayed.    Liver Function Tests: Recent Labs  Lab 06/22/19 0449 06/23/19 0332 06/24/19 0500 06/24/19 2030 06/25/19 0315  AST 193* 293* 128* 132* 121*  ALT 213* 320* 256* 273* 249*  ALKPHOS 70 64 73 98 88  BILITOT 0.8 0.6 0.7 0.4 0.6  PROT 6.1* 5.5* 5.7* 6.1* 5.5*  ALBUMIN 3.8 3.4* 3.5 3.6 3.3*   Recent Labs  Lab 06/21/19 1255  LIPASE 21   No results for input(s): AMMONIA in the last 168 hours.  CBC: Recent Labs  Lab 06/21/19 1255 06/22/19 0449 06/23/19 0332 06/24/19 0500 06/25/19 0315  WBC 9.5 9.3 8.0 7.5 7.4  NEUTROABS 7.4  --   --  6.1 5.7  HGB 13.8 12.8 11.7* 11.4* 11.6*  HCT 44.0 41.6 37.8 36.0 36.7  MCV 92.1 94.5 92.4 91.8 90.4  PLT 298 266 208 181 193    Cardiac Enzymes: No results for input(s): CKTOTAL, CKMB, CKMBINDEX, TROPONINI in the last  168 hours.  BNP: BNP (last 3 results) Recent Labs    06/23/19 0332  BNP 1,170.4*    ProBNP (last 3 results) No results for input(s): PROBNP in the last 8760 hours.    Other results:  Imaging:  No results found.   Medications:     Scheduled Medications: . Chlorhexidine Gluconate Cloth  6 each Topical Daily  . digoxin  0.125 mg Oral Daily  . enoxaparin (LOVENOX) injection  40 mg Subcutaneous Q24H  . furosemide  40 mg Intravenous BID  . pantoprazole  40 mg Oral Daily  . progesterone  100 mg Oral Daily  . sodium chloride flush  3 mL Intravenous Q12H  . spironolactone  12.5 mg Oral Daily  . thyroid  90 mg Oral QAC breakfast     Infusions: . sodium chloride    . sodium chloride 10 mL/hr at 06/25/19 0637  . piperacillin-tazobactam (ZOSYN)  IV 12.5 mL/hr at 06/25/19 0422     PRN Medications:  sodium chloride, acetaminophen **OR** acetaminophen, alum & mag hydroxide-simeth, morphine injection, ondansetron **OR** ondansetron (ZOFRAN) IV, ondansetron (ZOFRAN) IV, polyethylene glycol, sodium  chloride flush, sodium chloride flush   Assessment/Progress:   1. Acute systolic HF with cardiogenic shock - Echo EF 20-25%  moderately reduced RV function with severe MR/TR - most likely from R-CHOP - marked sinus with cool extremities suggests low output. Lactic acid last night 2.0 - volume status improved but still elevated - off b-blocker - on digoxin and spiro. Will likely need inotropic support - Plan R/L cath this am   2. VT - keep K> 4.0 Mg > 2.0 - No recurrent VT overnight - low threshold to start amio - will need LifeVest on d/c  3. Rt abd pain with acute cholecystitis. - suspect this is due to RV failure - GSU following - continue abx for now. - should improve with Rx of HF  4. Stage IV non-Hodgkin's lymphoma, mantle cell lymphoma - s/p R-CHOP w/ Neulasta - per Dr Earlie Server, conservative mgt for now  5. AKI - likely due to low ouptut - improved   6. Transaminitis - suspect chock liver/RV failure    Length of Stay: 3   Remi Rester MD 06/25/2019, 8:06 AM  Advanced Heart Failure Team Pager [phone] (M-F; 7a - 4p)  Please contact [facility] for night-coverage after hours (4p -7a ) and weekends on [email or web address]

## 2019-06-25 NOTE — Plan of Care (Signed)
  Problem: Consults Goal: RH ADOLESCENT PATIENT EDUCATION Description: Description: See Patient Education module for education specifics. Outcome: Progressing Goal: Skin Care Protocol Initiated - if Braden Score 18 or less Description: If consults are not indicated, leave blank or document N/A Outcome: Progressing Goal: Nutrition Consult-if indicated Outcome: Progressing Goal: Diabetes Guidelines if Diabetic/Glucose > 140 Description: If diabetic or lab glucose is > 140 mg/dl - Initiate Diabetes/Hyperglycemia Guidelines & Document Interventions  Outcome: Progressing   Problem: RH BOWEL ELIMINATION Goal: RH STG MANAGE BOWEL WITH ASSISTANCE Description: STG Manage Bowel with Assistance. Outcome: Progressing Goal: RH STG MANAGE BOWEL W/MEDICATION W/ASSISTANCE Description: STG Manage Bowel with Medication with Assistance. Outcome: Progressing   Problem: RH BLADDER ELIMINATION Goal: RH STG MANAGE BLADDER WITH ASSISTANCE Description: STG Manage Bladder With Assistance Outcome: Progressing Goal: RH STG MANAGE BLADDER WITH MEDICATION WITH ASSISTANCE Description: STG Manage Bladder With Medication With Assistance. Outcome: Progressing Goal: RH STG MANAGE BLADDER WITH EQUIPMENT WITH ASSISTANCE Description: STG Manage Bladder With Equipment With Assistance Outcome: Progressing   Problem: RH SKIN INTEGRITY Goal: RH STG SKIN FREE OF INFECTION/BREAKDOWN Outcome: Progressing Goal: RH STG MAINTAIN SKIN INTEGRITY WITH ASSISTANCE Description: STG Maintain Skin Integrity With Assistance. Outcome: Progressing Goal: RH STG ABLE TO PERFORM INCISION/WOUND CARE W/ASSISTANCE Description: STG Able To Perform Incision/Wound Care With Assistance. Outcome: Progressing   Problem: RH SAFETY Goal: RH STG ADHERE TO SAFETY PRECAUTIONS W/ASSISTANCE/DEVICE Description: STG Adhere to Safety Precautions With Assistance/Device. Outcome: Progressing Goal: RH STG DECREASED RISK OF FALL WITH  ASSISTANCE Description: STG Decreased Risk of Fall With Assistance. Outcome: Progressing   Problem: RH COGNITION-NURSING Goal: RH STG USES MEMORY AIDS/STRATEGIES W/ASSIST TO PROBLEM SOLVE Description: STG Uses Memory Aids/Strategies With Assistance to Problem Solve. Outcome: Progressing Goal: RH STG ANTICIPATES NEEDS/CALLS FOR ASSIST W/ASSIST/CUES Description: STG Anticipates Needs/Calls for Assist With Assistance/Cues. Outcome: Progressing   Problem: RH PAIN MANAGEMENT Goal: RH STG PAIN MANAGED AT OR BELOW PT'S PAIN GOAL Outcome: Progressing   Problem: RH KNOWLEDGE DEFICIT Goal: RH STG INCREASE KNOWLEDGE OF DIABETES Outcome: Progressing Goal: RH STG INCREASE KNOWLEDGE OF HYPERTENSION Outcome: Progressing Goal: RH STG INCREASE KNOWLEDGE OF DYSPHAGIA/FLUID INTAKE Outcome: Progressing Goal: RH STG INCREASE KNOWLEGDE OF HYPERLIPIDEMIA Outcome: Progressing Goal: RH STG INCREASE KNOWLEDGE OF STROKE PROPHYLAXIS Outcome: Progressing

## 2019-06-25 NOTE — Progress Notes (Signed)
Patient ID: Natalie Baker, female   DOB: 08/05/1955, 64 y.o.   MRN: [id number]       Subjective: Patient has been tolerating a solid diet. She c/o of post prandial indigestion only.  Some discomfort in epigastrium.  She has no pain in her RUQ.  In between meals she has minimal discomfort.  ROS: See above, otherwise other systems negative  Objective: Vital signs in last 24 hours: Temp:  [97.7 F (36.5 C)-98.9 F (37.2 C)] 98.9 F (37.2 C) (06/18 0422) Pulse Rate:  [106-132] 117 (06/18 0700) Resp:  [14-22] 20 (06/18 0700) BP: (91-116)/(47-79) 110/70 (06/18 0700) SpO2:  [95 %-99 %] 97 % (06/18 0422) Weight:  [93 kg-95 kg] 93 kg (06/18 0515) Last BM Date: 06/22/19  Intake/Output from previous day: 06/17 0701 - 06/18 0700 In: 601.3 [P.O.:360; I.V.:3; IV Piggyback:238.3] Out: 1350 [Urine:1350] Intake/Output this shift: No intake/output data recorded.  PE: Heart: tachy Lungs: CTAB Abd: soft, essentially NT, Nd, +BS  Lab Results:  Recent Labs    06/24/19 0500 06/25/19 0315  WBC 7.5 7.4  HGB 11.4* 11.6*  HCT 36.0 36.7  PLT 181 193   BMET Recent Labs    06/24/19 2030 06/25/19 0315  NA 138 137  K 4.0 3.5  CL 99 97*  CO2 29 28  GLUCOSE 123* 103*  BUN 16 15  CREATININE 1.28* 1.12*  CALCIUM 8.6* 8.4*   PT/INR No results for input(s): LABPROT, INR in the last 72 hours. CMP     Component Value Date/Time   NA 137 06/25/2019 0315   K 3.5 06/25/2019 0315   CL 97 (L) 06/25/2019 0315   CO2 28 06/25/2019 0315   GLUCOSE 103 (H) 06/25/2019 0315   BUN 15 06/25/2019 0315   CREATININE 1.12 (H) 06/25/2019 0315   CREATININE 0.82 06/02/2019 1430   CREATININE 0.74 12/21/2018 1513   CALCIUM 8.4 (L) 06/25/2019 0315   PROT 5.5 (L) 06/25/2019 0315   ALBUMIN 3.3 (L) 06/25/2019 0315   AST 121 (H) 06/25/2019 0315   AST 26 06/02/2019 1430   ALT 249 (H) 06/25/2019 0315   ALT 20 06/02/2019 1430   ALKPHOS 88 06/25/2019 0315   BILITOT 0.6 06/25/2019 0315   BILITOT 0.2 (L)  06/02/2019 1430   GFRNONAA 52 (L) 06/25/2019 0315   GFRNONAA >60 06/02/2019 1430   GFRNONAA 86 12/21/2018 1513   GFRAA >60 06/25/2019 0315   GFRAA >60 06/02/2019 1430   GFRAA 100 12/21/2018 1513   Lipase     Component Value Date/Time   LIPASE 21 06/21/2019 1255       Studies/Results: No results found.  Anti-infectives: Anti-infectives (From admission, onward)   Start     Dose/Rate Route Frequency Ordered Stop   06/22/19 2000  piperacillin-tazobactam (ZOSYN) IVPB 3.375 g     Discontinue     3.375 g 12.5 mL/hr over 240 Minutes Intravenous Every 8 hours 06/22/19 1821     06/22/19 1800  cefTRIAXone (ROCEPHIN) 1 g in sodium chloride 0.9 % 100 mL IVPB  Status:  Discontinued        1 g 200 mL/hr over 30 Minutes Intravenous Every 24 hours 06/22/19 0014 06/22/19 1821   06/21/19 1700  cefTRIAXone (ROCEPHIN) 1 g in sodium chloride 0.9 % 100 mL IVPB        1 g 200 mL/hr over 30 Minutes Intravenous  Once 06/21/19 1650 06/21/19 1758       Assessment/Plan Stage IV non-Hodgkin's lymphoma Mantle cell lymphoma -6 cycles of chemotherapy  withR-CHOPcompleted.No findings of disease progression 06/14/2019 Dr. Curt Bears Cardiomyopathy EF 20 to 25% - plan [place], BB and spironolactone; ?Cardiac cath; Hx tobacco use Hypothyroid Prediabetes Sinus tachycardia Chest CTA negative for PE AST/ALT elevation - AST/ALT downtrending Dehydration/mild renal insufficiency-improving Possible UTI  Cholecystitis/cholelithiasis vs GERD -Patient's current symptoms are more c/w GERD than cholecystitis.  Cont abx therapy for now.  Will add protonix and some prn maalox as needed to help with "indigestion" type pain and see how that helps her. -[place] only showed GBW thickening of 58mm which is negligent, but she does have stones and could be having some colic. -WBC is normal -continue current plan of care.   FEN: NPO for cath today ID: Rocephin x2 6/15; Zosyn 6/15>>day 3 DVT: can have  DVT chemical prophylaxis from our standpoint/SCD    LOS: 3 days    Henreitta Cea , [facility] 06/25/2019, 7:53 AM Please see Amion for pager number during day hours 7:00am-4:30pm or 7:00am -11:30am on weekends

## 2019-06-26 LAB — BASIC METABOLIC PANEL
Anion gap: 12 (ref 5–15)
BUN: 11 mg/dL (ref 8–23)
CO2: 29 mmol/L (ref 22–32)
Calcium: 8.6 mg/dL — ABNORMAL LOW (ref 8.9–10.3)
Chloride: 97 mmol/L — ABNORMAL LOW (ref 98–111)
Creatinine, Ser: 1.08 mg/dL — ABNORMAL HIGH (ref 0.44–1.00)
GFR calc Af Amer: >60 mL/min (ref >60)
GFR calc non Af Amer: 55 mL/min — ABNORMAL LOW (ref >60)
Glucose, Bld: 124 mg/dL — ABNORMAL HIGH (ref 70–99)
Potassium: 3.1 mmol/L — ABNORMAL LOW (ref 3.5–5.1)
Sodium: 138 mmol/L (ref 135–145)

## 2019-06-26 LAB — COOXEMETRY PANEL
Carboxyhemoglobin: 1.6 % — ABNORMAL HIGH (ref 0.5–1.5)
Methemoglobin: 1.1 % (ref 0.0–1.5)
O2 Saturation: 59.9 %
Total hemoglobin: 12 g/dL (ref 12.0–16.0)

## 2019-06-26 LAB — GLUCOSE, CAPILLARY: Glucose-Capillary: 117 mg/dL — ABNORMAL HIGH (ref 70–99)

## 2019-06-26 LAB — MAGNESIUM: Magnesium: 1.7 mg/dL (ref 1.7–2.4)

## 2019-06-26 MED ORDER — MAGNESIUM SULFATE 2 GM/50ML IV SOLN
2.0000 g | Freq: Once | INTRAVENOUS | Status: AC
  Administered 2019-06-26: 2 g via INTRAVENOUS
  Filled 2019-06-26: qty 50

## 2019-06-26 MED ORDER — POTASSIUM CHLORIDE CRYS ER 20 MEQ PO TBCR
40.0000 meq | EXTENDED_RELEASE_TABLET | Freq: Two times a day (BID) | ORAL | Status: DC
  Filled 2019-06-26: qty 2

## 2019-06-26 MED ORDER — LOSARTAN POTASSIUM 25 MG PO TABS
12.5000 mg | ORAL_TABLET | Freq: Two times a day (BID) | ORAL | Status: DC
  Administered 2019-06-26 – 2019-07-02 (×12): 12.5 mg via ORAL
  Filled 2019-06-26 (×12): qty 1

## 2019-06-26 MED ORDER — POTASSIUM CHLORIDE CRYS ER 20 MEQ PO TBCR
40.0000 meq | EXTENDED_RELEASE_TABLET | Freq: Three times a day (TID) | ORAL | Status: AC
  Administered 2019-06-26 (×3): 40 meq via ORAL
  Filled 2019-06-26 (×2): qty 2

## 2019-06-26 NOTE — Progress Notes (Signed)
Advanced Heart Failure Rounding Note   Subjective:    Cath 6/18 with normal cors. Low output.   Remains on milrinone 0.375 and IV amio.   Breathing better. Ab pain improved. Denies orthopnea or PND.  Swan #s done personally CVP 12 PA 44/29 (35) PCWP 23 (prominent v waves) Thermo 3.6/1.9 SVR 1502 PVR 3.0 WU PAPi  1.25   Objective:   Weight Range:  Vital Signs:   Temp:  [98.2 F (36.8 C)-99.1 F (37.3 C)] 98.6 F (37 C) (06/19 0700) Pulse Rate:  [101-156] 114 (06/19 0700) Resp:  [0-87] 19 (06/19 0700) BP: (97-129)/(47-85) 106/57 (06/19 0700) SpO2:  [93 %-100 %] 93 % (06/19 0700) Weight:  [91.9 kg] 91.9 kg (06/19 0500) Last BM Date: 06/26/19  Weight change: Filed Weights   06/24/19 1854 06/25/19 0515 06/26/19 0500  Weight: 93.4 kg 93 kg 91.9 kg    Intake/Output:   Intake/Output Summary (Last 24 hours) at 06/26/2019 0832 Last data filed at 06/26/2019 0700 Gross per 24 hour  Intake 609.12 ml  Output 3300 ml  Net -2690.88 ml     Physical Exam: General: Lying in bed. No resp difficulty HEENT: normal Neck: supple.RIJ swan  Carotids 2+ bilat; no bruits. No lymphadenopathy or thryomegaly appreciated. Cor: PMI nondisplaced. Regular rate & rhythm. No rubs, gallops or murmurs. Lungs: clear Abdomen: obese soft, mildly tender RUQ , nondistended. No hepatosplenomegaly. No bruits or masses. Good bowel sounds. Extremities: no cyanosis, clubbing, rash, 1+ edema Neuro: alert & orientedx3, cranial nerves grossly intact. moves all 4 extremities w/o difficulty. Affect pleasant   Telemetry:  Sinus tach 110-120 + PVCsPersonally reviewed  Labs: Basic Metabolic Panel: Recent Labs  Lab 06/23/19 0332 06/23/19 0332 06/24/19 0500 06/24/19 0500 06/24/19 2030 06/25/19 0315 06/25/19 1039 06/25/19 1045 06/26/19 0521  NA 137   < > 137   < > 138 137 140  139 143 138  K 3.7   < > 3.0*   < > 4.0 3.5 3.5  3.5 3.0* 3.1*  CL 107  --  98  --  99 97*  --   --  97*  CO2 20*   --  27  --  29 28  --   --  29  GLUCOSE 89  --  96  --  123* 103*  --   --  124*  BUN 18  --  19  --  16 15  --   --  11  CREATININE 0.98  --  1.14*  --  1.28* 1.12*  --   --  1.08*  CALCIUM 8.0*   < > 8.1*   < > 8.6* 8.4*  --   --  8.6*  MG 2.0  --  1.8  --   --  2.0  --   --   --   PHOS  --   --  3.7  --   --  3.8  --   --   --    < > = values in this interval not displayed.    Liver Function Tests: Recent Labs  Lab 06/22/19 0449 06/23/19 0332 06/24/19 0500 06/24/19 2030 06/25/19 0315  AST 193* 293* 128* 132* 121*  ALT 213* 320* 256* 273* 249*  ALKPHOS 70 64 73 98 88  BILITOT 0.8 0.6 0.7 0.4 0.6  PROT 6.1* 5.5* 5.7* 6.1* 5.5*  ALBUMIN 3.8 3.4* 3.5 3.6 3.3*   Recent Labs  Lab 06/21/19 1255  LIPASE 21   No results for  input(s): AMMONIA in the last 168 hours.  CBC: Recent Labs  Lab 06/21/19 1255 06/21/19 1255 06/22/19 0449 06/22/19 0449 06/23/19 0332 06/24/19 0500 06/25/19 0315 06/25/19 1039 06/25/19 1045  WBC 9.5  --  9.3  --  8.0 7.5 7.4  --   --   NEUTROABS 7.4  --   --   --   --  6.1 5.7  --   --   HGB 13.8   < > 12.8   < > 11.7* 11.4* 11.6* 12.9  12.9 11.9*  HCT 44.0   < > 41.6   < > 37.8 36.0 36.7 38.0  38.0 35.0*  MCV 92.1  --  94.5  --  92.4 91.8 90.4  --   --   PLT 298  --  266  --  208 181 193  --   --    < > = values in this interval not displayed.    Cardiac Enzymes: No results for input(s): CKTOTAL, CKMB, CKMBINDEX, TROPONINI in the last 168 hours.  BNP: BNP (last 3 results) Recent Labs    06/23/19 0332  BNP 1,170.4*    ProBNP (last 3 results) No results for input(s): PROBNP in the last 8760 hours.    Other results:  Imaging: CARDIAC CATHETERIZATION  Result Date: 06/25/2019 Findings: Ao = 109/71 (81) LV =  99/23 RA =  15 RV =  43/18 PA =  35/14 (25) PCW = 22 Fick cardiac output/index = 3.3/1.7 PVR = < 1.0 WU Ao sat = 100% PA sat = 49%, 53% Assessment: 1. Normal coronary arteries 2. Severe NICM EF 20% 3. Elevated filling pressures  and low CO c/w cardiogenic shock 4. Brief run of VT in cath lab Plan/Discussion: Move to ICU. Leave swan in. Start milrinone and IV amio. Continue diuresis. Glori Bickers, MD 11:26 AM  DG CHEST PORT 1 VIEW  Result Date: 06/25/2019 CLINICAL DATA:  Central line placement EXAM: PORTABLE CHEST 1 VIEW COMPARISON:  CT 06/21/2019, radiograph 06/10/2012 FINDINGS: Redemonstration of small bilateral pleural effusions with some adjacent hazy opacity in the bases which could reflect some residual edema or atelectatic change. No focal consolidative opacity. No pneumothorax. Accessed right IJ Port-A-Cath tip terminates at the superior cavoatrial junction. Right IJ approach Swan-Ganz catheter tip terminates near the level of the right main pulmonary artery. Stable mild cardiomegaly when compared to prior CT. New from more remote comparison radiograph. No acute osseous or soft tissue abnormality. IMPRESSION: 1. Redemonstration of small bilateral pleural effusions with some adjacent hazy opacity in the bases which could reflect some residual edema or atelectatic change. 2. Stable cardiomegaly. 3. Right IJ approach Swan-Ganz catheter tip terminates near the level of the right main pulmonary artery. 4. Right IJ approach Port-A-Cath tip terminates at the superior cavoatrial junction. Electronically Signed   By: Lovena Le M.D.   On: 06/25/2019 20:20     Medications:     Scheduled Medications: . Chlorhexidine Gluconate Cloth  6 each Topical Daily  . digoxin  0.125 mg Oral Daily  . enoxaparin (LOVENOX) injection  40 mg Subcutaneous Q24H  . furosemide  40 mg Intravenous BID  . pantoprazole  40 mg Oral Daily  . potassium chloride  40 mEq Oral BID  . progesterone  100 mg Oral Daily  . sodium chloride flush  3 mL Intravenous Q12H  . sodium chloride flush  3 mL Intravenous Q12H  . spironolactone  12.5 mg Oral Daily  . thyroid  90 mg Oral QAC breakfast  Infusions: . sodium chloride    . amiodarone 30 mg/hr  (06/26/19 0700)  . milrinone 0.375 mcg/kg/min (06/26/19 0700)  . piperacillin-tazobactam (ZOSYN)  IV 12.5 mL/hr at 06/26/19 0700    PRN Medications: sodium chloride, acetaminophen, alum & mag hydroxide-simeth, morphine injection, ondansetron (ZOFRAN) IV, ondansetron **OR** [DISCONTINUED] ondansetron (ZOFRAN) IV, polyethylene glycol, sodium chloride flush, sodium chloride flush   Assessment/Progress:   1. Acute systolic HF with severe biventricular HF cardiogenic shock - Echo EF 20-25%  moderately reduced RV function with severe MR/TR - most likely from R-CHOP - Cath 06/25/19 no CAD. Low output. Milrinone started - Co-ox 60% - Hemodynamics improved but still very tenuous. Remains tachycardic - Volume status remains elevated. Continue IV lasix.  - off b-blocker - Continue digoxin and spiro.  - Start losartan 12.5 bid  2. VT - keep K> 4.0 Mg > 2.0 - On IV amio while on milrinone - will need LifeVest on d/c  3. Rt abd pain with acute cholecystitis. - suspect this is due to RV failure - [facility]SU following - continue abx for now. - should improve with Rx of HF  4. Stage IV non-Hodgkin's lymphoma, mantle cell lymphoma - s/p R-CHOP w/ Neulasta - per Dr Earlie Server, conservative mgt for now  5. AKI - likely due to low ouptut - resolved  6. Transaminitis - suspect shock liver/RV failure  CRITICAL CARE Performed by: Glori Bickers  Total critical care time: 35 minutes  Critical care time was exclusive of separately billable procedures and treating other patients.  Critical care was necessary to treat or prevent imminent or life-threatening deterioration.  Critical care was time spent personally by me (independent of midlevel providers or residents) on the following activities: development of treatment plan with patient and/or surrogate as well as nursing, discussions with consultants, evaluation of patient's response to treatment, examination of patient, obtaining history  from patient or surrogate, ordering and performing treatments and interventions, ordering and review of laboratory studies, ordering and review of radiographic studies, pulse oximetry and re-evaluation of patient's condition.    Length of Stay: 4   Glori Bickers MD 06/26/2019, 8:32 AM  Advanced Heart Failure Team Pager [phone] (M-F; 7a - 4p)  Please contact [facility] for night-coverage after hours (4p -7a ) and weekends on [email or web address]

## 2019-06-26 NOTE — Progress Notes (Signed)
PROGRESS NOTE    Natalie Baker  ASN:[id number] DOB: 05-17-1955 DOA: 06/21/2019 PCP: [name], MD   Brief Narrative: 64 year old with past medical history significant for stage IV non-Hodgkin lymphoma, mantle cell lymphoma, hypertension, hypothyroidism who presents complaining of shortness of breath on exertion and evidence of sinus tachycardia.  She was admitted with persistent tachycardia.  She was found to have acute heart failure exacerbation systolic.  Cardiology was consulted.  Patient was also found to have cholecystitis and surgery has been following.  Plan is for conservative management for cholecystitis.  Patient was transferred to [facility] in 6/17 due to V. tach for right and left heart cath.  Patient underwent right and left heart cath on 6/18 which showed normal coronary arteries, severe nonischemic cardiomyopathy ejection fraction 20%, output and cardiogenic shock.  Patient was transferred to the ICU for inotropes.   Assessment & Plan:   Active Problems:   Hyperlipidemia   Hypothyroidism   Morbid obesity ([place]) BMI 35+ with prediabetes, hyperlipidemia, htn   Prediabetes   Mantle cell lymphoma (HCC)   Tachycardia   Acute systolic CHF (congestive heart failure) (HCC)   Acute cholecystitis   1-Systolic Heart Failure Exacerbation, nonischemic cardiomyopathy, cardiogenic shock: Appreciate Dr. Haroldine Laws assistance. Plan for IV amiodarone, IV Lasix and milrinone.  Holding beta-blocker for now. Echo with ejection fraction 20 to 25%, severe TR moderate to severe MR. On Digoxin.  Urine out out ; 3 L. Negative 6 L.   Nonsustained V. Tach: Started on amiodarone  Severe TR/moderate to severe MR: Per cardiology  Biliary colic /elevated liver enzymes Surgery recommending conservative management. Will require elective cholecystectomy as an outpatient. Her presentation may be related to heart failure. Dr. Donne Hazel with surgery, he does not think that patient has  acute cholecystitis, may be biliary colic.  Surgery  recommend short course of 5 days of antibiotics. Tolerating diet. Denies abdominal pain, report mild nausea.   Stage IV non-Hodgkin lymphoma, mantle cell lymphoma, Status post R-CHOP with Neulasta Follow with Dr. Earlie Server, conservative management for now.  AKI; likely due to decreased cardiac output  Acute hypoxic respiratory failure: Secondary to acute systolic heart failure exacerbation Continue with IV Laxis Oxygen supplementation.  Hypothyroidism: Continue with Synthroid  Prediabetes: Diet controlled Hormone deficiency status post bilateral hysterectomy with bilateral oophorectomy On progesterone Hypokalemia; check Mg. kcl 40 meq times 2.    Estimated body mass index is 37.06 kg/m as calculated from the following:   Height as of this encounter: 5\' 2"  (1.575 m).   Weight as of this encounter: 91.9 kg.   DVT prophylaxis: Lovenox Code Status: Full code Family Communication: Care discussed with patient.  Disposition Plan:  Status is: Inpatient  Remains inpatient appropriate because:Hemodynamically unstable   Dispo: The patient is from: Home              Anticipated d/c is to: Home              Anticipated d/c date is: 3 days              Patient currently is not medically stable to d/c.        Consultants:   Cardiology  Surgery  Procedures:  Cath Ultrasound: Gallstones with pericholecystic fluid and wall thickening compatible with acute cholecystitis Antimicrobials:  Zosyn 6-15---  Subjective: She is feeling well.  Breathing well. Denies abdominal pain, mild epigastric discomfort on palpation, mild nausea.    Objective: Vitals:   06/26/19 0400 06/26/19 0500 06/26/19 0600 06/26/19  0700  BP: (!) 110/54 (!) 114/58 113/61 (!) 106/57  Pulse: (!) 111 (!) 111 (!) 113 (!) 114  Resp: 16 16 18 19   Temp: 98.6 F (37 C) 98.6 F (37 C) 98.4 F (36.9 C) 98.6 F (37 C)  TempSrc:      SpO2: 93% 93% 94%  93%  Weight:  91.9 kg    Height:        Intake/Output Summary (Last 24 hours) at 06/26/2019 0810 Last data filed at 06/26/2019 0700 Gross per 24 hour  Intake 612.12 ml  Output 3300 ml  Net -2687.88 ml   Filed Weights   06/24/19 1854 06/25/19 0515 06/26/19 0500  Weight: 93.4 kg 93 kg 91.9 kg    Examination:  General exam: NAD Respiratory system: B.L air movement. No wheezing. No crackle.  Cardiovascular system: S 1, S 2 RRR.  Gastrointestinal system: BS present, soft, nt Central nervous system: Alert and oriented, following command Extremities: Symmetric power Skin: No rashes, lesions or ulcers    Data Reviewed: I have personally reviewed following labs and imaging studies  CBC: Recent Labs  Lab 06/21/19 1255 06/21/19 1255 06/22/19 0449 06/22/19 0449 06/23/19 0332 06/24/19 0500 06/25/19 0315 06/25/19 1039 06/25/19 1045  WBC 9.5  --  9.3  --  8.0 7.5 7.4  --   --   NEUTROABS 7.4  --   --   --   --  6.1 5.7  --   --   HGB 13.8   < > 12.8   < > 11.7* 11.4* 11.6* 12.9  12.9 11.9*  HCT 44.0   < > 41.6   < > 37.8 36.0 36.7 38.0  38.0 35.0*  MCV 92.1  --  94.5  --  92.4 91.8 90.4  --   --   PLT 298  --  266  --  208 181 193  --   --    < > = values in this interval not displayed.   Basic Metabolic Panel: Recent Labs  Lab 06/23/19 0332 06/23/19 0332 06/24/19 0500 06/24/19 0500 06/24/19 2030 06/25/19 0315 06/25/19 1039 06/25/19 1045 06/26/19 0521  NA 137   < > 137   < > 138 137 140  139 143 138  K 3.7   < > 3.0*   < > 4.0 3.5 3.5  3.5 3.0* 3.1*  CL 107  --  98  --  99 97*  --   --  97*  CO2 20*  --  27  --  29 28  --   --  29  GLUCOSE 89  --  96  --  123* 103*  --   --  124*  BUN 18  --  19  --  16 15  --   --  11  CREATININE 0.98  --  1.14*  --  1.28* 1.12*  --   --  1.08*  CALCIUM 8.0*  --  8.1*  --  8.6* 8.4*  --   --  8.6*  MG 2.0  --  1.8  --   --  2.0  --   --   --   PHOS  --   --  3.7  --   --  3.8  --   --   --    < > = values in this interval  not displayed.   GFR: Estimated Creatinine Clearance: 56.2 mL/min (A) (by C-G formula based on SCr of 1.08 mg/dL (H)). Liver Function Tests:  Recent Labs  Lab 06/22/19 0449 06/23/19 0332 06/24/19 0500 06/24/19 2030 06/25/19 0315  AST 193* 293* 128* 132* 121*  ALT 213* 320* 256* 273* 249*  ALKPHOS 70 64 73 98 88  BILITOT 0.8 0.6 0.7 0.4 0.6  PROT 6.1* 5.5* 5.7* 6.1* 5.5*  ALBUMIN 3.8 3.4* 3.5 3.6 3.3*   Recent Labs  Lab 06/21/19 1255  LIPASE 21   No results for input(s): AMMONIA in the last 168 hours. Coagulation Profile: No results for input(s): INR, PROTIME in the last 168 hours. Cardiac Enzymes: No results for input(s): CKTOTAL, CKMB, CKMBINDEX, TROPONINI in the last 168 hours. BNP (last 3 results) No results for input(s): PROBNP in the last 8760 hours. HbA1C: No results for input(s): HGBA1C in the last 72 hours. CBG: Recent Labs  Lab 06/26/19 0733  GLUCAP 117*   Lipid Profile: No results for input(s): CHOL, HDL, LDLCALC, TRIG, CHOLHDL, LDLDIRECT in the last 72 hours. Thyroid Function Tests: Recent Labs    06/24/19 0500  T4TOTAL 5.3   Anemia Panel: No results for input(s): VITAMINB12, FOLATE, FERRITIN, TIBC, IRON, RETICCTPCT in the last 72 hours. Sepsis Labs: Recent Labs  Lab 06/24/19 1956 06/25/19 0105  LATICACIDVEN 2.0* 1.2    Recent Results (from the past 240 hour(s))  SARS Coronavirus 2 by RT PCR (hospital order, performed in [facility] hospital lab) Nasopharyngeal Nasopharyngeal Swab     Status: None   Collection Time: 06/21/19  5:21 PM   Specimen: Nasopharyngeal Swab  Result Value Ref Range Status   SARS Coronavirus 2 NEGATIVE NEGATIVE Final    Comment: (NOTE) SARS-CoV-2 target nucleic acids are NOT DETECTED.  The SARS-CoV-2 RNA is generally detectable in upper and lower respiratory specimens during the acute phase of infection. The lowest concentration of SARS-CoV-2 viral copies this assay can detect is 250 copies / mL. A negative result  does not preclude SARS-CoV-2 infection and should not be used as the sole basis for treatment or other patient management decisions.  A negative result may occur with improper specimen collection / handling, submission of specimen other than nasopharyngeal swab, presence of viral mutation(s) within the areas targeted by this assay, and inadequate number of viral copies (<250 copies / mL). A negative result must be combined with clinical observations, patient history, and epidemiological information.  Fact Sheet for Patients:   StrictlyIdeas.no  Fact Sheet for Healthcare Providers: BankingDealers.co.za  This test is not yet approved or  cleared by the [place] FDA and has been authorized for detection and/or diagnosis of SARS-CoV-2 by FDA under an Emergency Use Authorization (EUA).  This EUA will remain in effect (meaning this test can be used) for the duration of the COVID-19 declaration under Section 564(b)(1) of the Act, 21 U.S.C. section 360bbb-3(b)(1), unless the authorization is terminated or revoked sooner.  Performed at [facility], [place] [address] [place], [place] [address]   Surgical pcr screen     Status: None   Collection Time: 06/24/19  7:42 PM   Specimen: Nasal Mucosa; Nasal Swab  Result Value Ref Range Status   MRSA, PCR NEGATIVE NEGATIVE Final   Staphylococcus aureus NEGATIVE NEGATIVE Final    Comment: (NOTE) The Xpert SA Assay (FDA approved for NASAL specimens in patients 8 years of age and older), is one component of a comprehensive surveillance program. It is not intended to diagnose infection nor to guide or monitor treatment. Performed at [facility], [place] [address] [place], [place] [address]  Radiology Studies: CARDIAC CATHETERIZATION  Result Date: 06/25/2019 Findings: Ao = 109/71 (81) LV =  99/23 RA =  15 RV =  43/18 PA =  35/14 (25) PCW = 22 Fick cardiac  output/index = 3.3/1.7 PVR = < 1.0 WU Ao sat = 100% PA sat = 49%, 53% Assessment: 1. Normal coronary arteries 2. Severe NICM EF 20% 3. Elevated filling pressures and low CO c/w cardiogenic shock 4. Brief run of VT in cath lab Plan/Discussion: Move to ICU. Leave swan in. Start milrinone and IV amio. Continue diuresis. Glori Bickers, MD 11:26 AM  DG CHEST PORT 1 VIEW  Result Date: 06/25/2019 CLINICAL DATA:  Central line placement EXAM: PORTABLE CHEST 1 VIEW COMPARISON:  CT 06/21/2019, radiograph 06/10/2012 FINDINGS: Redemonstration of small bilateral pleural effusions with some adjacent hazy opacity in the bases which could reflect some residual edema or atelectatic change. No focal consolidative opacity. No pneumothorax. Accessed right IJ Port-A-Cath tip terminates at the superior cavoatrial junction. Right IJ approach Swan-Ganz catheter tip terminates near the level of the right main pulmonary artery. Stable mild cardiomegaly when compared to prior CT. New from more remote comparison radiograph. No acute osseous or soft tissue abnormality. IMPRESSION: 1. Redemonstration of small bilateral pleural effusions with some adjacent hazy opacity in the bases which could reflect some residual edema or atelectatic change. 2. Stable cardiomegaly. 3. Right IJ approach Swan-Ganz catheter tip terminates near the level of the right main pulmonary artery. 4. Right IJ approach Port-A-Cath tip terminates at the superior cavoatrial junction. Electronically Signed   By: Lovena Le M.D.   On: 06/25/2019 20:20        Scheduled Meds: . Chlorhexidine Gluconate Cloth  6 each Topical Daily  . digoxin  0.125 mg Oral Daily  . enoxaparin (LOVENOX) injection  40 mg Subcutaneous Q24H  . furosemide  40 mg Intravenous BID  . pantoprazole  40 mg Oral Daily  . progesterone  100 mg Oral Daily  . sodium chloride flush  3 mL Intravenous Q12H  . sodium chloride flush  3 mL Intravenous Q12H  . spironolactone  12.5 mg Oral Daily   . thyroid  90 mg Oral QAC breakfast   Continuous Infusions: . sodium chloride    . amiodarone 30 mg/hr (06/26/19 0700)  . milrinone 0.375 mcg/kg/min (06/26/19 0700)  . piperacillin-tazobactam (ZOSYN)  IV 12.5 mL/hr at 06/26/19 0700     LOS: 4 days    Time spent: 35 minutes.     Elmarie Shiley, MD [facility]   If 7PM-7AM, please contact night-coverage [email or web address]  06/26/2019, 8:10 AM

## 2019-06-26 NOTE — Progress Notes (Addendum)
Patient ID: Natalie Baker, female   DOB: 05-Dec-1955, 64 y.o.   MRN: [id number]    1 Day Post-Op  Subjective: Some nausea this am, but this is not new.  Tolerating her diet.  Intermittent epigastric discomfort after.  protonix added yesterday for indigestion.  Can't tell much difference yet.  Cards note seen for current tx plans  ROS: See above, otherwise other systems negative  Objective: Vital signs in last 24 hours: Temp:  [98.2 F (36.8 C)-99.1 F (37.3 C)] 98.6 F (37 C) (06/19 0900) Pulse Rate:  [101-156] 118 (06/19 0900) Resp:  [0-87] 18 (06/19 0900) BP: (97-129)/(47-85) 107/69 (06/19 0900) SpO2:  [93 %-100 %] 95 % (06/19 0900) Weight:  [91.9 kg] 91.9 kg (06/19 0500) Last BM Date: 06/26/19  Intake/Output from previous day: 06/18 0701 - 06/19 0700 In: 612.1 [I.V.:528.7; IV Piggyback:83.4] Out: 3300 [Urine:3300] Intake/Output this shift: Total I/O In: 240 [P.O.:240] Out: -   PE: Abd: soft, minimally tender in epigastrium, +BS, ND  Lab Results:  Recent Labs    06/24/19 0500 06/24/19 0500 06/25/19 0315 06/25/19 0315 06/25/19 1039 06/25/19 1045  WBC 7.5  --  7.4  --   --   --   HGB 11.4*   < > 11.6*   < > 12.9  12.9 11.9*  HCT 36.0   < > 36.7   < > 38.0  38.0 35.0*  PLT 181  --  193  --   --   --    < > = values in this interval not displayed.   BMET Recent Labs    06/25/19 0315 06/25/19 1039 06/25/19 1045 06/26/19 0521  NA 137   < > 143 138  K 3.5   < > 3.0* 3.1*  CL 97*  --   --  97*  CO2 28  --   --  29  GLUCOSE 103*  --   --  124*  BUN 15  --   --  11  CREATININE 1.12*  --   --  1.08*  CALCIUM 8.4*  --   --  8.6*   < > = values in this interval not displayed.   PT/INR No results for input(s): LABPROT, INR in the last 72 hours. CMP     Component Value Date/Time   NA 138 06/26/2019 0521   K 3.1 (L) 06/26/2019 0521   CL 97 (L) 06/26/2019 0521   CO2 29 06/26/2019 0521   GLUCOSE 124 (H) 06/26/2019 0521   BUN 11 06/26/2019 0521    CREATININE 1.08 (H) 06/26/2019 0521   CREATININE 0.82 06/02/2019 1430   CREATININE 0.74 12/21/2018 1513   CALCIUM 8.6 (L) 06/26/2019 0521   PROT 5.5 (L) 06/25/2019 0315   ALBUMIN 3.3 (L) 06/25/2019 0315   AST 121 (H) 06/25/2019 0315   AST 26 06/02/2019 1430   ALT 249 (H) 06/25/2019 0315   ALT 20 06/02/2019 1430   ALKPHOS 88 06/25/2019 0315   BILITOT 0.6 06/25/2019 0315   BILITOT 0.2 (L) 06/02/2019 1430   GFRNONAA 55 (L) 06/26/2019 0521   GFRNONAA >60 06/02/2019 1430   GFRNONAA 86 12/21/2018 1513   GFRAA >60 06/26/2019 0521   GFRAA >60 06/02/2019 1430   GFRAA 100 12/21/2018 1513   Lipase     Component Value Date/Time   LIPASE 21 06/21/2019 1255       Studies/Results: CARDIAC CATHETERIZATION  Result Date: 06/25/2019 Findings: Ao = 109/71 (81) LV =  99/23 RA =  15 RV =  73/18 PA =  35/14 (25) PCW = 22 Fick cardiac output/index = 3.3/1.7 PVR = < 1.0 WU Ao sat = 100% PA sat = 49%, 53% Assessment: 1. Normal coronary arteries 2. Severe NICM EF 20% 3. Elevated filling pressures and low CO c/w cardiogenic shock 4. Brief run of VT in cath lab Plan/Discussion: Move to ICU. Leave swan in. Start milrinone and IV amio. Continue diuresis. Glori Bickers, MD 11:26 AM  DG CHEST PORT 1 VIEW  Result Date: 06/25/2019 CLINICAL DATA:  Central line placement EXAM: PORTABLE CHEST 1 VIEW COMPARISON:  CT 06/21/2019, radiograph 06/10/2012 FINDINGS: Redemonstration of small bilateral pleural effusions with some adjacent hazy opacity in the bases which could reflect some residual edema or atelectatic change. No focal consolidative opacity. No pneumothorax. Accessed right IJ Port-A-Cath tip terminates at the superior cavoatrial junction. Right IJ approach Swan-Ganz catheter tip terminates near the level of the right main pulmonary artery. Stable mild cardiomegaly when compared to prior CT. New from more remote comparison radiograph. No acute osseous or soft tissue abnormality. IMPRESSION: 1. Redemonstration  of small bilateral pleural effusions with some adjacent hazy opacity in the bases which could reflect some residual edema or atelectatic change. 2. Stable cardiomegaly. 3. Right IJ approach Swan-Ganz catheter tip terminates near the level of the right main pulmonary artery. 4. Right IJ approach Port-A-Cath tip terminates at the superior cavoatrial junction. Electronically Signed   By: Lovena Le M.D.   On: 06/25/2019 20:20    Anti-infectives: Anti-infectives (From admission, onward)   Start     Dose/Rate Route Frequency Ordered Stop   06/22/19 2000  piperacillin-tazobactam (ZOSYN) IVPB 3.375 g     Discontinue     3.375 g 12.5 mL/hr over 240 Minutes Intravenous Every 8 hours 06/22/19 1821     06/22/19 1800  cefTRIAXone (ROCEPHIN) 1 g in sodium chloride 0.9 % 100 mL IVPB  Status:  Discontinued        1 g 200 mL/hr over 30 Minutes Intravenous Every 24 hours 06/22/19 0014 06/22/19 1821   06/21/19 1700  cefTRIAXone (ROCEPHIN) 1 g in sodium chloride 0.9 % 100 mL IVPB        1 g 200 mL/hr over 30 Minutes Intravenous  Once 06/21/19 1650 06/21/19 1758       Assessment/Plan 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 Curt Bears Cardiomyopathy EF 20 to 25% - plan [place], BB and spironolactone; ?Cardiac cath; Hx tobacco use Hypothyroid Prediabetes Sinus tachycardia Chest CTA negative for PE AST/ALT elevation - AST/ALT downtrending, likely multifactorial from CHF and possibly from gb Dehydration/mild renal insufficiency-improving Possible UTI  Biliary colic -no WBC and has been on abx for at least 5 days.  Given doubtful she has cholecystitis and likely just biliary colic, 5 days of abx therapy should be sufficient -cont low fat diet -do not think she will need a perc chole drain in the setting of no overt infection -she would ultimately benefit from elective lap  chole for her biliary colic, if and when she is cleared by cardiology in the future. -no further surgical needs at this time -she may follow up as an outpatient for further surgical discussion if felt like a surgical candidate by cards moving forward. -we will sign off  FEN: low fat diet ID: Rocephin x2 6/15; Zosyn 6/15>>day 3 DVT: can have DVT chemical prophylaxis from our standpoint/SCD    LOS: 4 days    Donald Siva  [facility] Surgery 06/26/2019, 9:31 AM Please see Amion for pager number during day hours 7:00am-4:30pm or 7:00am -11:30am on weekends

## 2019-06-27 LAB — BASIC METABOLIC PANEL
Anion gap: 10 (ref 5–15)
BUN: 9 mg/dL (ref 8–23)
CO2: 30 mmol/L (ref 22–32)
Calcium: 8.5 mg/dL — ABNORMAL LOW (ref 8.9–10.3)
Chloride: 95 mmol/L — ABNORMAL LOW (ref 98–111)
Creatinine, Ser: 1.07 mg/dL — ABNORMAL HIGH (ref 0.44–1.00)
GFR calc Af Amer: >60 mL/min (ref >60)
GFR calc non Af Amer: 55 mL/min — ABNORMAL LOW (ref >60)
Glucose, Bld: 112 mg/dL — ABNORMAL HIGH (ref 70–99)
Potassium: 3.3 mmol/L — ABNORMAL LOW (ref 3.5–5.1)
Sodium: 135 mmol/L (ref 135–145)

## 2019-06-27 LAB — COOXEMETRY PANEL
Carboxyhemoglobin: 1.6 % — ABNORMAL HIGH (ref 0.5–1.5)
Methemoglobin: 1 % (ref 0.0–1.5)
O2 Saturation: 66.2 %
Total hemoglobin: 11.5 g/dL — ABNORMAL LOW (ref 12.0–16.0)

## 2019-06-27 LAB — MAGNESIUM: Magnesium: 1.7 mg/dL (ref 1.7–2.4)

## 2019-06-27 LAB — CBC
HCT: 35.2 % — ABNORMAL LOW (ref 36.0–46.0)
Hemoglobin: 11 g/dL — ABNORMAL LOW (ref 12.0–15.0)
MCH: 28.1 pg (ref 26.0–34.0)
MCHC: 31.3 g/dL (ref 30.0–36.0)
MCV: 90 fL (ref 80.0–100.0)
Platelets: 143 10*3/uL — ABNORMAL LOW (ref 150–400)
RBC: 3.91 MIL/uL (ref 3.87–5.11)
RDW: 14.8 % (ref 11.5–15.5)
WBC: 5.9 10*3/uL (ref 4.0–10.5)
nRBC: 0 % (ref 0.0–0.2)

## 2019-06-27 MED ORDER — MAGNESIUM SULFATE 2 GM/50ML IV SOLN
2.0000 g | Freq: Once | INTRAVENOUS | Status: AC
  Administered 2019-06-27: 2 g via INTRAVENOUS
  Filled 2019-06-27: qty 50

## 2019-06-27 MED ORDER — SPIRONOLACTONE 25 MG PO TABS
25.0000 mg | ORAL_TABLET | Freq: Every day | ORAL | Status: DC
  Administered 2019-06-28 – 2019-07-02 (×5): 25 mg via ORAL
  Filled 2019-06-27 (×5): qty 1

## 2019-06-27 MED ORDER — POTASSIUM CHLORIDE CRYS ER 20 MEQ PO TBCR
40.0000 meq | EXTENDED_RELEASE_TABLET | Freq: Three times a day (TID) | ORAL | Status: AC
  Administered 2019-06-27 (×3): 40 meq via ORAL
  Filled 2019-06-27 (×3): qty 2

## 2019-06-27 MED ORDER — MAGNESIUM OXIDE 400 (241.3 MG) MG PO TABS
400.0000 mg | ORAL_TABLET | Freq: Two times a day (BID) | ORAL | Status: DC
  Administered 2019-06-27 – 2019-07-02 (×11): 400 mg via ORAL
  Filled 2019-06-27 (×11): qty 1

## 2019-06-27 NOTE — Progress Notes (Signed)
Advanced Heart Failure Rounding Note   Subjective:    Cath 6/18 with normal cors. Low output.   Remains on milrinone 0.375 and IV amio. Rhythm stable. Co-ox 60%-> 66%  Breathing better. Denies SOB, orthopnea or PND. Ab pain improving. Able to eat.   Swan #s done personally CVP 14 PA 41/24 (30) PCWP 21  Thermo 3.9/2.0 SVR 1322 PVR 2.5 WU PAPi  1.21   Objective:   Weight Range:  Vital Signs:   Temp:  [97.7 F (36.5 C)-98.4 F (36.9 C)] 97.9 F (36.6 C) (06/20 0700) Pulse Rate:  [97-119] 107 (06/20 0952) Resp:  [10-24] 14 (06/20 0700) BP: (88-117)/(46-73) 92/54 (06/20 0700) SpO2:  [93 %-99 %] 98 % (06/20 0700) Last BM Date: 06/26/19  Weight change: Filed Weights   06/24/19 1854 06/25/19 0515 06/26/19 0500  Weight: 93.4 kg 93 kg 91.9 kg    Intake/Output:   Intake/Output Summary (Last 24 hours) at 06/27/2019 1007 Last data filed at 06/27/2019 0700 Gross per 24 hour  Intake 1229.75 ml  Output 2401 ml  Net -1171.25 ml     Physical Exam: General:  Lying in bed . No resp difficulty HEENT: normal Neck: supple. RIJ swan Carotids 2+ bilat; no bruits. No lymphadenopathy or thryomegaly appreciated. Cor: PMI nondisplaced. Regular rate & rhythm. No rubs, gallops or murmurs. Lungs: clear Abdomen: soft, nontender, nondistended. No hepatosplenomegaly. No bruits or masses. Good bowel sounds. Extremities: no cyanosis, clubbing, rash, tr edema Neuro: alert & orientedx3, cranial nerves grossly intact. moves all 4 extremities w/o difficulty. Affect pleasant  Telemetry:  Sinus tach 100-110 Personally reviewed  Labs: Basic Metabolic Panel: Recent Labs  Lab 06/23/19 0332 06/23/19 0332 06/24/19 0500 06/24/19 0500 06/24/19 2030 06/24/19 2030 06/25/19 0315 06/25/19 1039 06/25/19 1045 06/26/19 0521 06/27/19 0420  NA 137   < > 137   < > 138   < > 137 140  139 143 138 135  K 3.7   < > 3.0*   < > 4.0   < > 3.5 3.5  3.5 3.0* 3.1* 3.3*  CL 107   < > 98  --  99  --   97*  --   --  97* 95*  CO2 20*   < > 27  --  29  --  28  --   --  29 30  GLUCOSE 89   < > 96  --  123*  --  103*  --   --  124* 112*  BUN 18   < > 19  --  16  --  15  --   --  11 9  CREATININE 0.98   < > 1.14*  --  1.28*  --  1.12*  --   --  1.08* 1.07*  CALCIUM 8.0*   < > 8.1*   < > 8.6*   < > 8.4*  --   --  8.6* 8.5*  MG 2.0  --  1.8  --   --   --  2.0  --   --  1.7 1.7  PHOS  --   --  3.7  --   --   --  3.8  --   --   --   --    < > = values in this interval not displayed.    Liver Function Tests: Recent Labs  Lab 06/22/19 0449 06/23/19 0332 06/24/19 0500 06/24/19 2030 06/25/19 0315  AST 193* 293* 128* 132* 121*  ALT 213* 320*  256* 273* 249*  ALKPHOS 70 64 73 98 88  BILITOT 0.8 0.6 0.7 0.4 0.6  PROT 6.1* 5.5* 5.7* 6.1* 5.5*  ALBUMIN 3.8 3.4* 3.5 3.6 3.3*   Recent Labs  Lab 06/21/19 1255  LIPASE 21   No results for input(s): AMMONIA in the last 168 hours.  CBC: Recent Labs  Lab 06/21/19 1255 06/21/19 1255 06/22/19 0449 06/22/19 0449 06/23/19 0332 06/23/19 0332 06/24/19 0500 06/25/19 0315 06/25/19 1039 06/25/19 1045 06/27/19 0420  WBC 9.5   < > 9.3  --  8.0  --  7.5 7.4  --   --  5.9  NEUTROABS 7.4  --   --   --   --   --  6.1 5.7  --   --   --   HGB 13.8   < > 12.8   < > 11.7*   < > 11.4* 11.6* 12.9  12.9 11.9* 11.0*  HCT 44.0   < > 41.6   < > 37.8   < > 36.0 36.7 38.0  38.0 35.0* 35.2*  MCV 92.1   < > 94.5  --  92.4  --  91.8 90.4  --   --  90.0  PLT 298   < > 266  --  208  --  181 193  --   --  143*   < > = values in this interval not displayed.    Cardiac Enzymes: No results for input(s): CKTOTAL, CKMB, CKMBINDEX, TROPONINI in the last 168 hours.  BNP: BNP (last 3 results) Recent Labs    06/23/19 0332  BNP 1,170.4*    ProBNP (last 3 results) No results for input(s): PROBNP in the last 8760 hours.    Other results:  Imaging: CARDIAC CATHETERIZATION  Result Date: 06/25/2019 Findings: Ao = 109/71 (81) LV =  99/23 RA =  15 RV =  43/18 PA  =  35/14 (25) PCW = 22 Fick cardiac output/index = 3.3/1.7 PVR = < 1.0 WU Ao sat = 100% PA sat = 49%, 53% Assessment: 1. Normal coronary arteries 2. Severe NICM EF 20% 3. Elevated filling pressures and low CO c/w cardiogenic shock 4. Brief run of VT in cath lab Plan/Discussion: Move to ICU. Leave swan in. Start milrinone and IV amio. Continue diuresis. Glori Bickers, MD 11:26 AM  DG CHEST PORT 1 VIEW  Result Date: 06/25/2019 CLINICAL DATA:  Central line placement EXAM: PORTABLE CHEST 1 VIEW COMPARISON:  CT 06/21/2019, radiograph 06/10/2012 FINDINGS: Redemonstration of small bilateral pleural effusions with some adjacent hazy opacity in the bases which could reflect some residual edema or atelectatic change. No focal consolidative opacity. No pneumothorax. Accessed right IJ Port-A-Cath tip terminates at the superior cavoatrial junction. Right IJ approach Swan-Ganz catheter tip terminates near the level of the right main pulmonary artery. Stable mild cardiomegaly when compared to prior CT. New from more remote comparison radiograph. No acute osseous or soft tissue abnormality. IMPRESSION: 1. Redemonstration of small bilateral pleural effusions with some adjacent hazy opacity in the bases which could reflect some residual edema or atelectatic change. 2. Stable cardiomegaly. 3. Right IJ approach Swan-Ganz catheter tip terminates near the level of the right main pulmonary artery. 4. Right IJ approach Port-A-Cath tip terminates at the superior cavoatrial junction. Electronically Signed   By: Lovena Le M.D.   On: 06/25/2019 20:20     Medications:     Scheduled Medications: . Chlorhexidine Gluconate Cloth  6 each Topical Daily  . digoxin  0.125 mg  Oral Daily  . enoxaparin (LOVENOX) injection  40 mg Subcutaneous Q24H  . furosemide  40 mg Intravenous BID  . losartan  12.5 mg Oral BID  . magnesium oxide  400 mg Oral BID  . pantoprazole  40 mg Oral Daily  . potassium chloride  40 mEq Oral TID  .  progesterone  100 mg Oral Daily  . sodium chloride flush  3 mL Intravenous Q12H  . sodium chloride flush  3 mL Intravenous Q12H  . spironolactone  12.5 mg Oral Daily  . thyroid  90 mg Oral QAC breakfast    Infusions: . sodium chloride    . amiodarone 30 mg/hr (06/27/19 0700)  . magnesium sulfate bolus IVPB 2 g (06/27/19 0934)  . milrinone 0.375 mcg/kg/min (06/27/19 0700)  . piperacillin-tazobactam (ZOSYN)  IV 12.5 mL/hr at 06/27/19 0700    PRN Medications: sodium chloride, acetaminophen, alum & mag hydroxide-simeth, morphine injection, ondansetron (ZOFRAN) IV, ondansetron **OR** [DISCONTINUED] ondansetron (ZOFRAN) IV, polyethylene glycol, sodium chloride flush, sodium chloride flush   Assessment/Progress:   1. Acute systolic HF with severe biventricular HF cardiogenic shock - Echo EF 20-25%  moderately reduced RV function with severe MR/TR - most likely from R-CHOP - Cath 06/25/19 no CAD. Low output. Milrinone started - On milrinone 0.375 Co-ox 60%-> 66% - Hemodynamics improved but still tenuous  Remains tachycardic but improving HR 130-> 105 - Volume status remains elevated. Continue IV lasix. Weigh down 10 pounds over past 2-3 days - off b-blocker - Continue digoxin and spiro. Increase spiro to 25 daily - Contnue losartan 12.5 bid. BP too soft too titrate or switch too Entresto   2. VT - Improved. keep K> 4.0 Mg > 2.0 - On IV amio while on milrinone - will need LifeVest on d/c  3. Rt abd pain with acute cholecystitis. - suspect this is due to RV failure - GSU following - Completed abx - should improve with Rx of HF  4. Stage IV non-Hodgkin's lymphoma, mantle cell lymphoma - s/p R-CHOP w/ Neulasta - per Dr Earlie Server, conservative mgt for now  5. AKI - likely due to low ouptut - resolved  6. Transaminitis - suspect shock liver/RV failure  CRITICAL CARE Performed by: Glori Bickers  Total critical care time: 35 minutes  Critical care time was  exclusive of separately billable procedures and treating other patients.  Critical care was necessary to treat or prevent imminent or life-threatening deterioration.  Critical care was time spent personally by me (independent of midlevel providers or residents) on the following activities: development of treatment plan with patient and/or surrogate as well as nursing, discussions with consultants, evaluation of patient's response to treatment, examination of patient, obtaining history from patient or surrogate, ordering and performing treatments and interventions, ordering and review of laboratory studies, ordering and review of radiographic studies, pulse oximetry and re-evaluation of patient's condition.    Length of Stay: 5   Glori Bickers MD 06/27/2019, 10:07 AM  Advanced Heart Failure Team Pager [phone] (M-F; 7a - 4p)  Please contact [facility] for night-coverage after hours (4p -7a ) and weekends on [email or web address]

## 2019-06-27 NOTE — Progress Notes (Signed)
PROGRESS NOTE    Natalie Baker  KKX:[id number] DOB: [birth year] DOA: 06/21/2019 PCP: [name], MD   Brief Narrative: 65 year old with past medical history significant for stage IV non-Hodgkin lymphoma, mantle cell lymphoma, hypertension, hypothyroidism who presents complaining of shortness of breath on exertion and evidence of sinus tachycardia.  She was admitted with persistent tachycardia.  She was found to have acute heart failure exacerbation systolic.  Cardiology was consulted.  Patient was also found to have cholecystitis and surgery has been following.  Plan is for conservative management for cholecystitis.  Patient was transferred to [facility] in 6/17 due to V. tach for right and left heart cath.  Patient underwent right and left heart cath on 6/18 which showed normal coronary arteries, severe nonischemic cardiomyopathy ejection fraction 20%, output and cardiogenic shock.  Patient was transferred to the ICU for inotropes.   Assessment & Plan:   Active Problems:   Hyperlipidemia   Hypothyroidism   Morbid obesity ([facility]) BMI 35+ with prediabetes, hyperlipidemia, htn   Prediabetes   Mantle cell lymphoma (HCC)   Tachycardia   Acute systolic CHF (congestive heart failure) (HCC)   Acute cholecystitis   1-Systolic Heart Failure Exacerbation, nonischemic cardiomyopathy, cardiogenic shock: Appreciate Dr. Haroldine Laws assistance. Plan for IV amiodarone, IV Lasix and milrinone.  Holding beta-blocker for now. Echo with ejection fraction 20 to 25%, severe TR moderate to severe MR. On Digoxin.  Negative 7 L. Weight down to 202 from 212 Continue management per cardiology  Nonsustained V. Tach: Started on amiodarone  Severe TR/moderate to severe MR: Per cardiology  Biliary colic /elevated liver enzymes Surgery recommending conservative management. Will require elective cholecystectomy as an outpatient. Her presentation may be related to heart failure. Dr. Donne Hazel with  surgery, he does not think that patient has acute cholecystitis, may be biliary colic.  Surgery  recommend short course of 5 days of antibiotics. Tolerating diet.  Discontinue Zosyn 6-20.  Stage IV non-Hodgkin lymphoma, mantle cell lymphoma, Status post R-CHOP with Neulasta Follow with Dr. Earlie Server, conservative management for now.  AKI; likely due to decreased cardiac output  Acute hypoxic respiratory failure: Secondary to acute systolic heart failure exacerbation Continue with IV Laxis Oxygen supplementation.  Hypothyroidism: Continue with Synthroid  Prediabetes: Diet controlled Hormone deficiency status post bilateral hysterectomy with bilateral oophorectomy On progesterone Hypokalemia; replete with kcl 40 meq time 3 Hypomagnesemia; IV supplementation   Estimated body mass index is 37.06 kg/m as calculated from the following:   Height as of this encounter: 5\' 2"  (1.575 m).   Weight as of this encounter: 91.9 kg.   DVT prophylaxis: Lovenox Code Status: Full code Family Communication: Care discussed with patient.  Disposition Plan:  Status is: Inpatient  Remains inpatient appropriate because:Hemodynamically unstable   Dispo: The patient is from: Home              Anticipated d/c is to: Home              Anticipated d/c date is: 3 days              Patient currently is not medically stable to d/c.        Consultants:   Cardiology  Surgery  Procedures:  Cath Ultrasound: Gallstones with pericholecystic fluid and wall thickening compatible with acute cholecystitis Antimicrobials:  Zosyn 6-15---  Subjective: Doing well. No new complaints.    Objective: Vitals:   06/27/19 0500 06/27/19 0600 06/27/19 0625 06/27/19 0700  BP: (!) 93/49 (!) 97/46  (!) 92/54  Pulse: (!) 102 (!) 106 98 (!) 102  Resp: 15 13 13 14   Temp: 97.7 F (36.5 C) 98.1 F (36.7 C) 97.9 F (36.6 C) 97.9 F (36.6 C)  TempSrc: Core     SpO2: 99% 96% 98% 98%  Weight:      Height:         Intake/Output Summary (Last 24 hours) at 06/27/2019 4166 Last data filed at 06/27/2019 0700 Gross per 24 hour  Intake 1581 ml  Output 3001 ml  Net -1420 ml   Filed Weights   06/24/19 1854 06/25/19 0515 06/26/19 0500  Weight: 93.4 kg 93 kg 91.9 kg    Examination:  General exam: NAD Respiratory system: CTA Cardiovascular system: S1 , S 2 Tachycardia Gastrointestinal system: BS present, soft, nt Central nervous system: Alert, follows command Extremities: Symmetric power Skin: no rashes.    Data Reviewed: I have personally reviewed following labs and imaging studies  CBC: Recent Labs  Lab 06/21/19 1255 06/21/19 1255 06/22/19 0449 06/22/19 0449 06/23/19 0332 06/23/19 0332 06/24/19 0500 06/25/19 0315 06/25/19 1039 06/25/19 1045 06/27/19 0420  WBC 9.5   < > 9.3  --  8.0  --  7.5 7.4  --   --  5.9  NEUTROABS 7.4  --   --   --   --   --  6.1 5.7  --   --   --   HGB 13.8   < > 12.8   < > 11.7*   < > 11.4* 11.6* 12.9  12.9 11.9* 11.0*  HCT 44.0   < > 41.6   < > 37.8   < > 36.0 36.7 38.0  38.0 35.0* 35.2*  MCV 92.1   < > 94.5  --  92.4  --  91.8 90.4  --   --  90.0  PLT 298   < > 266  --  208  --  181 193  --   --  143*   < > = values in this interval not displayed.   Basic Metabolic Panel: Recent Labs  Lab 06/23/19 0332 06/23/19 0332 06/24/19 0500 06/24/19 0500 06/24/19 2030 06/24/19 2030 06/25/19 0315 06/25/19 1039 06/25/19 1045 06/26/19 0521 06/27/19 0420  NA 137   < > 137   < > 138   < > 137 140  139 143 138 135  K 3.7   < > 3.0*   < > 4.0   < > 3.5 3.5  3.5 3.0* 3.1* 3.3*  CL 107   < > 98  --  99  --  97*  --   --  97* 95*  CO2 20*   < > 27  --  29  --  28  --   --  29 30  GLUCOSE 89   < > 96  --  123*  --  103*  --   --  124* 112*  BUN 18   < > 19  --  16  --  15  --   --  11 9  CREATININE 0.98   < > 1.14*  --  1.28*  --  1.12*  --   --  1.08* 1.07*  CALCIUM 8.0*   < > 8.1*  --  8.6*  --  8.4*  --   --  8.6* 8.5*  MG 2.0  --  1.8  --   --   --   2.0  --   --  1.7 1.7  PHOS  --   --  3.7  --   --   --  3.8  --   --   --   --    < > = values in this interval not displayed.   GFR: Estimated Creatinine Clearance: 56.8 mL/min (A) (by C-G formula based on SCr of 1.07 mg/dL (H)). Liver Function Tests: Recent Labs  Lab 06/22/19 0449 06/23/19 0332 06/24/19 0500 06/24/19 2030 06/25/19 0315  AST 193* 293* 128* 132* 121*  ALT 213* 320* 256* 273* 249*  ALKPHOS 70 64 73 98 88  BILITOT 0.8 0.6 0.7 0.4 0.6  PROT 6.1* 5.5* 5.7* 6.1* 5.5*  ALBUMIN 3.8 3.4* 3.5 3.6 3.3*   Recent Labs  Lab 06/21/19 1255  LIPASE 21   No results for input(s): AMMONIA in the last 168 hours. Coagulation Profile: No results for input(s): INR, PROTIME in the last 168 hours. Cardiac Enzymes: No results for input(s): CKTOTAL, CKMB, CKMBINDEX, TROPONINI in the last 168 hours. BNP (last 3 results) No results for input(s): PROBNP in the last 8760 hours. HbA1C: No results for input(s): HGBA1C in the last 72 hours. CBG: Recent Labs  Lab 06/26/19 0733  GLUCAP 117*   Lipid Profile: No results for input(s): CHOL, HDL, LDLCALC, TRIG, CHOLHDL, LDLDIRECT in the last 72 hours. Thyroid Function Tests: No results for input(s): TSH, T4TOTAL, FREET4, T3FREE, THYROIDAB in the last 72 hours. Anemia Panel: No results for input(s): VITAMINB12, FOLATE, FERRITIN, TIBC, IRON, RETICCTPCT in the last 72 hours. Sepsis Labs: Recent Labs  Lab 06/24/19 1956 06/25/19 0105  LATICACIDVEN 2.0* 1.2    Recent Results (from the past 240 hour(s))  SARS Coronavirus 2 by RT PCR (hospital order, performed in [facility] hospital lab) Nasopharyngeal Nasopharyngeal Swab     Status: None   Collection Time: 06/21/19  5:21 PM   Specimen: Nasopharyngeal Swab  Result Value Ref Range Status   SARS Coronavirus 2 NEGATIVE NEGATIVE Final    Comment: (NOTE) SARS-CoV-2 target nucleic acids are NOT DETECTED.  The SARS-CoV-2 RNA is generally detectable in upper and lower respiratory  specimens during the acute phase of infection. The lowest concentration of SARS-CoV-2 viral copies this assay can detect is 250 copies / mL. A negative result does not preclude SARS-CoV-2 infection and should not be used as the sole basis for treatment or other patient management decisions.  A negative result may occur with improper specimen collection / handling, submission of specimen other than nasopharyngeal swab, presence of viral mutation(s) within the areas targeted by this assay, and inadequate number of viral copies (<250 copies / mL). A negative result must be combined with clinical observations, patient history, and epidemiological information.  Fact Sheet for Patients:   StrictlyIdeas.no  Fact Sheet for Healthcare Providers: BankingDealers.co.za  This test is not yet approved or  cleared by the [place] FDA and has been authorized for detection and/or diagnosis of SARS-CoV-2 by FDA under an Emergency Use Authorization (EUA).  This EUA will remain in effect (meaning this test can be used) for the duration of the COVID-19 declaration under Section 564(b)(1) of the Act, 21 U.S.C. section 360bbb-3(b)(1), unless the authorization is terminated or revoked sooner.  Performed at [facility], [place] [address], [place], [place] [address]   Surgical pcr screen     Status: None   Collection Time: 06/24/19  7:42 PM   Specimen: Nasal Mucosa; Nasal Swab  Result Value Ref Range Status   MRSA, PCR NEGATIVE NEGATIVE Final   Staphylococcus aureus NEGATIVE NEGATIVE Final  Comment: (NOTE) The Xpert SA Assay (FDA approved for NASAL specimens in patients 67 years of age and older), is one component of a comprehensive surveillance program. It is not intended to diagnose infection nor to guide or monitor treatment. Performed at [facility], [place] [address] [place], [place] [address]          Radiology  Studies: CARDIAC CATHETERIZATION  Result Date: 06/25/2019 Findings: Ao = 109/71 (81) LV =  99/23 RA =  15 RV =  43/18 PA =  35/14 (25) PCW = 22 Fick cardiac output/index = 3.3/1.7 PVR = < 1.0 WU Ao sat = 100% PA sat = 49%, 53% Assessment: 1. Normal coronary arteries 2. Severe NICM EF 20% 3. Elevated filling pressures and low CO c/w cardiogenic shock 4. Brief run of VT in cath lab Plan/Discussion: Move to ICU. Leave swan in. Start milrinone and IV amio. Continue diuresis. Glori Bickers, MD 11:26 AM  DG CHEST PORT 1 VIEW  Result Date: 06/25/2019 CLINICAL DATA:  Central line placement EXAM: PORTABLE CHEST 1 VIEW COMPARISON:  CT 06/21/2019, radiograph 06/10/2012 FINDINGS: Redemonstration of small bilateral pleural effusions with some adjacent hazy opacity in the bases which could reflect some residual edema or atelectatic change. No focal consolidative opacity. No pneumothorax. Accessed right IJ Port-A-Cath tip terminates at the superior cavoatrial junction. Right IJ approach Swan-Ganz catheter tip terminates near the level of the right main pulmonary artery. Stable mild cardiomegaly when compared to prior CT. New from more remote comparison radiograph. No acute osseous or soft tissue abnormality. IMPRESSION: 1. Redemonstration of small bilateral pleural effusions with some adjacent hazy opacity in the bases which could reflect some residual edema or atelectatic change. 2. Stable cardiomegaly. 3. Right IJ approach Swan-Ganz catheter tip terminates near the level of the right main pulmonary artery. 4. Right IJ approach Port-A-Cath tip terminates at the superior cavoatrial junction. Electronically Signed   By: Lovena Le M.D.   On: 06/25/2019 20:20        Scheduled Meds: . Chlorhexidine Gluconate Cloth  6 each Topical Daily  . digoxin  0.125 mg Oral Daily  . enoxaparin (LOVENOX) injection  40 mg Subcutaneous Q24H  . furosemide  40 mg Intravenous BID  . losartan  12.5 mg Oral BID  . magnesium oxide   400 mg Oral BID  . pantoprazole  40 mg Oral Daily  . potassium chloride  40 mEq Oral TID  . progesterone  100 mg Oral Daily  . sodium chloride flush  3 mL Intravenous Q12H  . sodium chloride flush  3 mL Intravenous Q12H  . spironolactone  12.5 mg Oral Daily  . thyroid  90 mg Oral QAC breakfast   Continuous Infusions: . sodium chloride    . amiodarone 30 mg/hr (06/27/19 0700)  . magnesium sulfate bolus IVPB    . milrinone 0.375 mcg/kg/min (06/27/19 0700)  . piperacillin-tazobactam (ZOSYN)  IV 12.5 mL/hr at 06/27/19 0700     LOS: 5 days    Time spent: 35 minutes.     Elmarie Shiley, MD [facility]   If 7PM-7AM, please contact night-coverage [email or web address]  06/27/2019, 7:27 AM

## 2019-06-27 NOTE — Plan of Care (Signed)
  Problem: Consults Goal: RH ADOLESCENT PATIENT EDUCATION Description: Description: See Patient Education module for education specifics. Outcome: Progressing Goal: Skin Care Protocol Initiated - if Braden Score 18 or less Description: If consults are not indicated, leave blank or document N/A Outcome: Progressing Goal: Nutrition Consult-if indicated Outcome: Progressing   Problem: RH BOWEL ELIMINATION Goal: RH STG MANAGE BOWEL WITH ASSISTANCE Description: STG Manage Bowel with Assistance. Outcome: Progressing Goal: RH STG MANAGE BOWEL W/MEDICATION W/ASSISTANCE Description: STG Manage Bowel with Medication with Assistance. Outcome: Progressing   Problem: RH BLADDER ELIMINATION Goal: RH STG MANAGE BLADDER WITH ASSISTANCE Description: STG Manage Bladder With Assistance Outcome: Progressing Goal: RH STG MANAGE BLADDER WITH MEDICATION WITH ASSISTANCE Description: STG Manage Bladder With Medication With Assistance. Outcome: Progressing Goal: RH STG MANAGE BLADDER WITH EQUIPMENT WITH ASSISTANCE Description: STG Manage Bladder With Equipment With Assistance Outcome: Progressing   Problem: RH SKIN INTEGRITY Goal: RH STG SKIN FREE OF INFECTION/BREAKDOWN Outcome: Progressing Goal: RH STG MAINTAIN SKIN INTEGRITY WITH ASSISTANCE Description: STG Maintain Skin Integrity With Assistance. Outcome: Progressing Goal: RH STG ABLE TO PERFORM INCISION/WOUND CARE W/ASSISTANCE Description: STG Able To Perform Incision/Wound Care With Assistance. Outcome: Progressing   Problem: RH SAFETY Goal: RH STG ADHERE TO SAFETY PRECAUTIONS W/ASSISTANCE/DEVICE Description: STG Adhere to Safety Precautions With Assistance/Device. Outcome: Progressing Goal: RH STG DECREASED RISK OF FALL WITH ASSISTANCE Description: STG Decreased Risk of Fall With Assistance. Outcome: Progressing   Problem: RH COGNITION-NURSING Goal: RH STG USES MEMORY AIDS/STRATEGIES W/ASSIST TO PROBLEM SOLVE Description: STG Uses Memory  Aids/Strategies With Assistance to Problem Solve. Outcome: Progressing Goal: RH STG ANTICIPATES NEEDS/CALLS FOR ASSIST W/ASSIST/CUES Description: STG Anticipates Needs/Calls for Assist With Assistance/Cues. Outcome: Progressing   Problem: RH PAIN MANAGEMENT Goal: RH STG PAIN MANAGED AT OR BELOW PT'S PAIN GOAL Outcome: Progressing   Problem: RH KNOWLEDGE DEFICIT Goal: RH STG INCREASE KNOWLEDGE OF DIABETES Outcome: Progressing Goal: RH STG INCREASE KNOWLEDGE OF HYPERTENSION Outcome: Progressing Goal: RH STG INCREASE KNOWLEDGE OF DYSPHAGIA/FLUID INTAKE Outcome: Progressing Goal: RH STG INCREASE KNOWLEGDE OF HYPERLIPIDEMIA Outcome: Progressing Goal: RH STG INCREASE KNOWLEDGE OF STROKE PROPHYLAXIS Outcome: Progressing   Problem: Education: Goal: Knowledge of General Education information will improve Description: Including pain rating scale, medication(s)/side effects and non-pharmacologic comfort measures Outcome: Progressing   Problem: Health Behavior/Discharge Planning: Goal: Ability to manage health-related needs will improve Outcome: Progressing   Problem: Clinical Measurements: Goal: Ability to maintain clinical measurements within normal limits will improve Outcome: Progressing Goal: Will remain free from infection Outcome: Progressing Goal: Diagnostic test results will improve Outcome: Progressing Goal: Respiratory complications will improve Outcome: Progressing Goal: Cardiovascular complication will be avoided Outcome: Progressing   Problem: Activity: Goal: Risk for activity intolerance will decrease Outcome: Progressing   Problem: Nutrition: Goal: Adequate nutrition will be maintained Outcome: Progressing   Problem: Coping: Goal: Level of anxiety will decrease Outcome: Progressing   Problem: Elimination: Goal: Will not experience complications related to bowel motility Outcome: Progressing Goal: Will not experience complications related to urinary  retention Outcome: Progressing   Problem: Pain Managment: Goal: General experience of comfort will improve Outcome: Progressing   Problem: Safety: Goal: Ability to remain free from injury will improve Outcome: Progressing   Problem: Skin Integrity: Goal: Risk for impaired skin integrity will decrease Outcome: Progressing

## 2019-06-28 ENCOUNTER — Encounter (HOSPITAL_COMMUNITY): Payer: Self-pay | Admitting: Internal Medicine

## 2019-06-28 LAB — COOXEMETRY PANEL
Carboxyhemoglobin: 1.7 % — ABNORMAL HIGH (ref 0.5–1.5)
Methemoglobin: 1.1 % (ref 0.0–1.5)
O2 Saturation: 71.9 %
Total hemoglobin: 11.9 g/dL — ABNORMAL LOW (ref 12.0–16.0)

## 2019-06-28 LAB — BASIC METABOLIC PANEL
Anion gap: 9 (ref 5–15)
BUN: 12 mg/dL (ref 8–23)
CO2: 30 mmol/L (ref 22–32)
Calcium: 8.7 mg/dL — ABNORMAL LOW (ref 8.9–10.3)
Chloride: 98 mmol/L (ref 98–111)
Creatinine, Ser: 1.1 mg/dL — ABNORMAL HIGH (ref 0.44–1.00)
GFR calc Af Amer: >60 mL/min (ref >60)
GFR calc non Af Amer: 53 mL/min — ABNORMAL LOW (ref >60)
Glucose, Bld: 113 mg/dL — ABNORMAL HIGH (ref 70–99)
Potassium: 4.2 mmol/L (ref 3.5–5.1)
Sodium: 137 mmol/L (ref 135–145)

## 2019-06-28 NOTE — Progress Notes (Signed)
PROGRESS NOTE    Natalie Baker  MPN:[id number] DOB: 04/18/1955 DOA: 06/21/2019 PCP: [name], MD   Brief Narrative: 64 year old with past medical history significant for stage IV non-Hodgkin lymphoma, mantle cell lymphoma, hypertension, hypothyroidism who presents complaining of shortness of breath on exertion and evidence of sinus tachycardia.  She was admitted with persistent tachycardia.  She was found to have acute heart failure exacerbation systolic.  Cardiology was consulted.  Patient was also found to have cholecystitis and surgery has been following.  Plan is for conservative management for cholecystitis.  Patient was transferred to [facility] in 6/17 due to V. tach for right and left heart cath.  Patient underwent right and left heart cath on 6/18 which showed normal coronary arteries, severe nonischemic cardiomyopathy ejection fraction 20%, output and cardiogenic shock.  Patient was transferred to the ICU for inotropes.   Assessment & Plan:   Active Problems:   Hyperlipidemia   Hypothyroidism   Morbid obesity ([place]) BMI 35+ with prediabetes, hyperlipidemia, htn   Prediabetes   Mantle cell lymphoma (HCC)   Tachycardia   Acute systolic CHF (congestive heart failure) (HCC)   Acute cholecystitis   1-Systolic Heart Failure Exacerbation, nonischemic cardiomyopathy, cardiogenic shock: Appreciate Dr. Haroldine Laws assistance. Plan for IV amiodarone, milrinone. Plan to transition from IV lasix to oral tomorrow.  Holding beta-blocker for now. Echo with ejection fraction 20 to 25%, severe TR moderate to severe MR. On Digoxin.  Negative 8 L. Weight down to 198 from 212 Continue management per cardiology Oxygen sat 71 %  Nonsustained V. Tach: Started on amiodarone  Severe TR/moderate to severe MR: Per cardiology  Biliary colic /elevated liver enzymes Surgery recommending conservative management. Will require elective cholecystectomy as an outpatient. Her presentation  may be related to heart failure. Dr. Donne Hazel with surgery, he does not think that patient has acute cholecystitis, may be biliary colic.  Surgery  recommend short course of 5 days of antibiotics. Tolerating diet.  Discontinue Zosyn 6-20.  Stage IV non-Hodgkin lymphoma, mantle cell lymphoma, Status post R-CHOP with Neulasta Follow with Dr. Earlie Server, conservative management for now.  AKI; likely due to decreased cardiac output  Acute hypoxic respiratory failure: Secondary to acute systolic heart failure exacerbation Treated with IV lasix.  Oxygen supplementation.  Hypothyroidism: Continue with Synthroid  Prediabetes: Diet controlled Hormone deficiency status post bilateral hysterectomy with bilateral oophorectomy On progesterone Hypokalemia; replete with kcl 40 meq time 3 Hypomagnesemia; IV supplementation   Estimated body mass index is 36.37 kg/m as calculated from the following:   Height as of this encounter: 5\' 2"  (1.575 m).   Weight as of this encounter: 90.2 kg.   DVT prophylaxis: Lovenox Code Status: Full code Family Communication: Care discussed with patient.  Disposition Plan:  Status is: Inpatient  Remains inpatient appropriate because:Hemodynamically unstable   Dispo: The patient is from: Home              Anticipated d/c is to: Home              Anticipated d/c date is: 3 days              Patient currently is not medically stable to d/c.        Consultants:   Cardiology  Surgery  Procedures:  Cath Ultrasound: Gallstones with pericholecystic fluid and wall thickening compatible with acute cholecystitis Antimicrobials:  Zosyn 6-15---  Subjective: Feels better, denies dyspnea, feels with more energy  Objective: Vitals:   06/28/19 0100 06/28/19 0200 06/28/19  0400 06/28/19 0500  BP: (!) 101/50 (!) 99/57 (!) 92/55 (!) 97/37  Pulse: (!) 119 (!) 115 (!) 110 (!) 106  Resp: 17 19 15 17   Temp: 99 F (37.2 C) 99 F (37.2 C) 98.4 F (36.9 C)  98.4 F (36.9 C)  TempSrc:      SpO2: 94% 94% 94% 94%  Weight:    90.2 kg  Height:        Intake/Output Summary (Last 24 hours) at 06/28/2019 0847 Last data filed at 06/28/2019 0400 Gross per 24 hour  Intake 574.96 ml  Output 1900 ml  Net -1325.04 ml   Filed Weights   06/25/19 0515 06/26/19 0500 06/28/19 0500  Weight: 93 kg 91.9 kg 90.2 kg    Examination:  General exam: NAD Respiratory system: CTA Cardiovascular system: S 1, S 2 RRR Gastrointestinal system: BS present, soft, nt Central nervous system:Alert, follows command Extremities: Symmetric power Skin: no rashes.    Data Reviewed: I have personally reviewed following labs and imaging studies  CBC: Recent Labs  Lab 06/21/19 1255 06/21/19 1255 06/22/19 0449 06/22/19 0449 06/23/19 0332 06/23/19 0332 06/24/19 0500 06/25/19 0315 06/25/19 1039 06/25/19 1045 06/27/19 0420  WBC 9.5   < > 9.3  --  8.0  --  7.5 7.4  --   --  5.9  NEUTROABS 7.4  --   --   --   --   --  6.1 5.7  --   --   --   HGB 13.8   < > 12.8   < > 11.7*   < > 11.4* 11.6* 12.9  12.9 11.9* 11.0*  HCT 44.0   < > 41.6   < > 37.8   < > 36.0 36.7 38.0  38.0 35.0* 35.2*  MCV 92.1   < > 94.5  --  92.4  --  91.8 90.4  --   --  90.0  PLT 298   < > 266  --  208  --  181 193  --   --  143*   < > = values in this interval not displayed.   Basic Metabolic Panel: Recent Labs  Lab 06/23/19 0332 06/23/19 0332 06/24/19 0500 06/24/19 0500 06/24/19 2030 06/24/19 2030 06/25/19 0315 06/25/19 0315 06/25/19 1039 06/25/19 1045 06/26/19 0521 06/27/19 0420 06/28/19 0520  NA 137   < > 137   < > 138   < > 137   < > 140  139 143 138 135 137  K 3.7   < > 3.0*   < > 4.0   < > 3.5   < > 3.5  3.5 3.0* 3.1* 3.3* 4.2  CL 107   < > 98   < > 99  --  97*  --   --   --  97* 95* 98  CO2 20*   < > 27   < > 29  --  28  --   --   --  29 30 30   GLUCOSE 89   < > 96   < > 123*  --  103*  --   --   --  124* 112* 113*  BUN 18   < > 19   < > 16  --  15  --   --   --  11 9  12   CREATININE 0.98   < > 1.14*   < > 1.28*  --  1.12*  --   --   --  1.08*  1.07* 1.10*  CALCIUM 8.0*   < > 8.1*   < > 8.6*  --  8.4*  --   --   --  8.6* 8.5* 8.7*  MG 2.0  --  1.8  --   --   --  2.0  --   --   --  1.7 1.7  --   PHOS  --   --  3.7  --   --   --  3.8  --   --   --   --   --   --    < > = values in this interval not displayed.   GFR: Estimated Creatinine Clearance: 54.6 mL/min (A) (by C-G formula based on SCr of 1.1 mg/dL (H)). Liver Function Tests: Recent Labs  Lab 06/22/19 0449 06/23/19 0332 06/24/19 0500 06/24/19 2030 06/25/19 0315  AST 193* 293* 128* 132* 121*  ALT 213* 320* 256* 273* 249*  ALKPHOS 70 64 73 98 88  BILITOT 0.8 0.6 0.7 0.4 0.6  PROT 6.1* 5.5* 5.7* 6.1* 5.5*  ALBUMIN 3.8 3.4* 3.5 3.6 3.3*   Recent Labs  Lab 06/21/19 1255  LIPASE 21   No results for input(s): AMMONIA in the last 168 hours. Coagulation Profile: No results for input(s): INR, PROTIME in the last 168 hours. Cardiac Enzymes: No results for input(s): CKTOTAL, CKMB, CKMBINDEX, TROPONINI in the last 168 hours. BNP (last 3 results) No results for input(s): PROBNP in the last 8760 hours. HbA1C: No results for input(s): HGBA1C in the last 72 hours. CBG: Recent Labs  Lab 06/26/19 0733  GLUCAP 117*   Lipid Profile: No results for input(s): CHOL, HDL, LDLCALC, TRIG, CHOLHDL, LDLDIRECT in the last 72 hours. Thyroid Function Tests: No results for input(s): TSH, T4TOTAL, FREET4, T3FREE, THYROIDAB in the last 72 hours. Anemia Panel: No results for input(s): VITAMINB12, FOLATE, FERRITIN, TIBC, IRON, RETICCTPCT in the last 72 hours. Sepsis Labs: Recent Labs  Lab 06/24/19 1956 06/25/19 0105  LATICACIDVEN 2.0* 1.2    Recent Results (from the past 240 hour(s))  SARS Coronavirus 2 by RT PCR (hospital order, performed in [facility] hospital lab) Nasopharyngeal Nasopharyngeal Swab     Status: None   Collection Time: 06/21/19  5:21 PM   Specimen: Nasopharyngeal Swab  Result Value  Ref Range Status   SARS Coronavirus 2 NEGATIVE NEGATIVE Final    Comment: (NOTE) SARS-CoV-2 target nucleic acids are NOT DETECTED.  The SARS-CoV-2 RNA is generally detectable in upper and lower respiratory specimens during the acute phase of infection. The lowest concentration of SARS-CoV-2 viral copies this assay can detect is 250 copies / mL. A negative result does not preclude SARS-CoV-2 infection and should not be used as the sole basis for treatment or other patient management decisions.  A negative result may occur with improper specimen collection / handling, submission of specimen other than nasopharyngeal swab, presence of viral mutation(s) within the areas targeted by this assay, and inadequate number of viral copies (<250 copies / mL). A negative result must be combined with clinical observations, patient history, and epidemiological information.  Fact Sheet for Patients:   StrictlyIdeas.no  Fact Sheet for Healthcare Providers: BankingDealers.co.za  This test is not yet approved or  cleared by the [place] FDA and has been authorized for detection and/or diagnosis of SARS-CoV-2 by FDA under an Emergency Use Authorization (EUA).  This EUA will remain in effect (meaning this test can be used) for the duration of the COVID-19 declaration under Section 564(b)(1) of  the Act, 21 U.S.C. section 360bbb-3(b)(1), unless the authorization is terminated or revoked sooner.  Performed at [facility], [place] [address], [place], [place] [address]   Surgical pcr screen     Status: None   Collection Time: 06/24/19  7:42 PM   Specimen: Nasal Mucosa; Nasal Swab  Result Value Ref Range Status   MRSA, PCR NEGATIVE NEGATIVE Final   Staphylococcus aureus NEGATIVE NEGATIVE Final    Comment: (NOTE) The Xpert SA Assay (FDA approved for NASAL specimens in patients 36 years of age and older), is one component of a  comprehensive surveillance program. It is not intended to diagnose infection nor to guide or monitor treatment. Performed at [facility], [place] [address] [place], [place] [address]          Radiology Studies: No results found.      Scheduled Meds: . Chlorhexidine Gluconate Cloth  6 each Topical Daily  . digoxin  0.125 mg Oral Daily  . enoxaparin (LOVENOX) injection  40 mg Subcutaneous Q24H  . losartan  12.5 mg Oral BID  . magnesium oxide  400 mg Oral BID  . pantoprazole  40 mg Oral Daily  . progesterone  100 mg Oral Daily  . sodium chloride flush  3 mL Intravenous Q12H  . sodium chloride flush  3 mL Intravenous Q12H  . spironolactone  25 mg Oral Daily  . thyroid  90 mg Oral QAC breakfast   Continuous Infusions: . sodium chloride    . amiodarone 30 mg/hr (06/28/19 0400)  . milrinone 0.25 mcg/kg/min (06/28/19 0833)     LOS: 6 days    Time spent: 35 minutes.     Elmarie Shiley, MD [facility]   If 7PM-7AM, please contact night-coverage [email or web address]  06/28/2019, 8:47 AM

## 2019-06-28 NOTE — Progress Notes (Addendum)
Advanced Heart Failure Rounding Note   Subjective:    Cath 6/18 with normal cors. Low output.   Remains on milrinone 0.375 and IV amio. CO-OX 72%.   Feeling ok today. Denies SOB.   Swan #s  CVP 8  PA 31/12 (21)  PCWP 8  SVR 1168   PVR 2.9 Thermo 4.52 /2.34 PAPi  2.4    Objective:   Weight Range:  Vital Signs:   Temp:  [97.7 F (36.5 C)-99.1 F (37.3 C)] 98.4 F (36.9 C) (06/21 0500) Pulse Rate:  [101-120] 106 (06/21 0500) Resp:  [13-20] 17 (06/21 0500) BP: (79-107)/(35-78) 97/37 (06/21 0500) SpO2:  [93 %-100 %] 94 % (06/21 0500) Weight:  [90.2 kg] 90.2 kg (06/21 0500) Last BM Date: 06/27/19  Weight change: Filed Weights   06/25/19 0515 06/26/19 0500 06/28/19 0500  Weight: 93 kg 91.9 kg 90.2 kg    Intake/Output:   Intake/Output Summary (Last 24 hours) at 06/28/2019 0713 Last data filed at 06/28/2019 0400 Gross per 24 hour  Intake 664.66 ml  Output 1900 ml  Net -1235.34 ml    CVP 8  Physical Exam: General: . No resp difficulty HEENT: normal Neck: supple. JVP 9-10 . Carotids 2+ bilat; no bruits. No lymphadenopathy or thryomegaly appreciated. RIJ  Cor: PMI nondisplaced. Regular rate & rhythm. No rubs,or murmurs. + S3  R upper chest porta cath  Lungs: clear Abdomen: soft, nontender, nondistended. No hepatosplenomegaly. No bruits or masses. Good bowel sounds. Extremities: no cyanosis, clubbing, rash, edema Neuro: alert & orientedx3, cranial nerves grossly intact. moves all 4 extremities w/o difficulty. Affect pleasant  Telemetry:  Sinus Tach 110s   Labs: Basic Metabolic Panel: Recent Labs  Lab 06/23/19 0332 06/23/19 0332 06/24/19 0500 06/24/19 0500 06/24/19 2030 06/24/19 2030 06/25/19 0315 06/25/19 0315 06/25/19 1039 06/25/19 1045 06/26/19 0521 06/27/19 0420 06/28/19 0520  NA 137   < > 137   < > 138   < > 137   < > 140  139 143 138 135 137  K 3.7   < > 3.0*   < > 4.0   < > 3.5   < > 3.5  3.5 3.0* 3.1* 3.3* 4.2  CL 107   < > 98   < >  99  --  97*  --   --   --  97* 95* 98  CO2 20*   < > 27   < > 29  --  28  --   --   --  29 30 30   GLUCOSE 89   < > 96   < > 123*  --  103*  --   --   --  124* 112* 113*  BUN 18   < > 19   < > 16  --  15  --   --   --  11 9 12   CREATININE 0.98   < > 1.14*   < > 1.28*  --  1.12*  --   --   --  1.08* 1.07* 1.10*  CALCIUM 8.0*   < > 8.1*   < > 8.6*   < > 8.4*   < >  --   --  8.6* 8.5* 8.7*  MG 2.0  --  1.8  --   --   --  2.0  --   --   --  1.7 1.7  --   PHOS  --   --  3.7  --   --   --  3.8  --   --   --   --   --   --    < > = values in this interval not displayed.    Liver Function Tests: Recent Labs  Lab 06/22/19 0449 06/23/19 0332 06/24/19 0500 06/24/19 2030 06/25/19 0315  AST 193* 293* 128* 132* 121*  ALT 213* 320* 256* 273* 249*  ALKPHOS 70 64 73 98 88  BILITOT 0.8 0.6 0.7 0.4 0.6  PROT 6.1* 5.5* 5.7* 6.1* 5.5*  ALBUMIN 3.8 3.4* 3.5 3.6 3.3*   Recent Labs  Lab 06/21/19 1255  LIPASE 21   No results for input(s): AMMONIA in the last 168 hours.  CBC: Recent Labs  Lab 06/21/19 1255 06/21/19 1255 06/22/19 0449 06/22/19 0449 06/23/19 0332 06/23/19 0332 06/24/19 0500 06/25/19 0315 06/25/19 1039 06/25/19 1045 06/27/19 0420  WBC 9.5   < > 9.3  --  8.0  --  7.5 7.4  --   --  5.9  NEUTROABS 7.4  --   --   --   --   --  6.1 5.7  --   --   --   HGB 13.8   < > 12.8   < > 11.7*   < > 11.4* 11.6* 12.9  12.9 11.9* 11.0*  HCT 44.0   < > 41.6   < > 37.8   < > 36.0 36.7 38.0  38.0 35.0* 35.2*  MCV 92.1   < > 94.5  --  92.4  --  91.8 90.4  --   --  90.0  PLT 298   < > 266  --  208  --  181 193  --   --  143*   < > = values in this interval not displayed.    Cardiac Enzymes: No results for input(s): CKTOTAL, CKMB, CKMBINDEX, TROPONINI in the last 168 hours.  BNP: BNP (last 3 results) Recent Labs    06/23/19 0332  BNP 1,170.4*    ProBNP (last 3 results) No results for input(s): PROBNP in the last 8760 hours.    Other results:  Imaging: No results  found.   Medications:     Scheduled Medications: . Chlorhexidine Gluconate Cloth  6 each Topical Daily  . digoxin  0.125 mg Oral Daily  . enoxaparin (LOVENOX) injection  40 mg Subcutaneous Q24H  . furosemide  40 mg Intravenous BID  . losartan  12.5 mg Oral BID  . magnesium oxide  400 mg Oral BID  . pantoprazole  40 mg Oral Daily  . progesterone  100 mg Oral Daily  . sodium chloride flush  3 mL Intravenous Q12H  . sodium chloride flush  3 mL Intravenous Q12H  . spironolactone  25 mg Oral Daily  . thyroid  90 mg Oral QAC breakfast    Infusions: . sodium chloride    . amiodarone 30 mg/hr (06/28/19 0400)  . milrinone 0.375 mcg/kg/min (06/28/19 0400)    PRN Medications: sodium chloride, acetaminophen, alum & mag hydroxide-simeth, morphine injection, ondansetron (ZOFRAN) IV, ondansetron **OR** [DISCONTINUED] ondansetron (ZOFRAN) IV, polyethylene glycol, sodium chloride flush, sodium chloride flush   Assessment/Progress:   1. Acute systolic HF with severe biventricular HF cardiogenic shock - Echo EF 20-25%  moderately reduced RV function with severe MR/TR - most likely from R-CHOP - Cath 06/25/19 no CAD. Low output. Milrinone started - Hemodynamics improved.  - On milrinone 0.375 Co-ox Stable 71 %. Start to wean milrinone  0.25 mcg.   - Volume status improved. Stop  IV lasix. Start lasix po tomorrow.  - Keep  off b-blocker - Continue digoxin and spiro. Continue spiro to 25 daily - Contnue losartan 12.5 bid. BP too soft too titrate or switch too Entresto  - no room to titrate.  - Remove swan place PICC has porta cath.   2. VT - Improved. keep K> 4.0 Mg > 2.0 - On IV amio while on milrinone - will need LifeVest on d/c  3. Rt abd pain with acute cholecystitis. - suspect this is due to RV failure - GSU following - Completed abx - should improve with Rx of HF  4. Stage IV non-Hodgkin's lymphoma, mantle cell lymphoma - s/p R-CHOP w/ Neulasta - per Dr Earlie Server,  conservative mgt for now  5. AKI - likely due to low ouptut - resolved  6. Transaminitis - suspect shock liver/RV failure    Length of Stay: 6   Amy Clegg\NP-C  06/28/2019, 7:13 AM  Advanced Heart Failure Team Pager [phone] (M-F; 7a - 4p)  Please contact [facility] for night-coverage after hours (4p -7a ) and weekends on [email or web address]  Agree with above.  Remains on milrinone 0.375. Swan numbers and co-ox are improved (reviewed personally). Tachycardia improving.   General:  Lying in bed . No resp difficulty HEENT: normal Neck: supple. RIJ swan Carotids 2+ bilat; no bruits. No lymphadenopathy or thryomegaly appreciated. Cor: PMI nondisplaced. Tachy regular + s3  Lungs: clear Abdomen: obese soft, nontender, nondistended. No hepatosplenomegaly. No bruits or masses. Good bowel sounds. Extremities: no cyanosis, clubbing, rash, edema Neuro: alert & orientedx3, cranial nerves grossly intact. moves all 4 extremities w/o difficulty. Affect pleasant  Still tenuous but improving slowly. Decrease milrinone to 0.25. Can place PICC and pull swan. Titrate GDMT as BP allows.   CRITICAL CARE Performed by: Glori Bickers  Total critical care time: 35 minutes  Critical care time was exclusive of separately billable procedures and treating other patients.  Critical care was necessary to treat or prevent imminent or life-threatening deterioration.  Critical care was time spent personally by me (independent of midlevel providers or residents) on the following activities: development of treatment plan with patient and/or surrogate as well as nursing, discussions with consultants, evaluation of patient's response to treatment, examination of patient, obtaining history from patient or surrogate, ordering and performing treatments and interventions, ordering and review of laboratory studies, ordering and review of radiographic studies, pulse oximetry and re-evaluation of patient's  condition.  Glori Bickers, MD  11:11 AM

## 2019-06-29 DIAGNOSIS — R0600 Dyspnea, unspecified: Secondary | ICD-10-CM

## 2019-06-29 LAB — COOXEMETRY PANEL
Carboxyhemoglobin: 1.7 % — ABNORMAL HIGH (ref 0.5–1.5)
Methemoglobin: 1.1 % (ref 0.0–1.5)
O2 Saturation: 70.5 %
Total hemoglobin: 12.6 g/dL (ref 12.0–16.0)

## 2019-06-29 LAB — BASIC METABOLIC PANEL
Anion gap: 10 (ref 5–15)
BUN: 9 mg/dL (ref 8–23)
CO2: 28 mmol/L (ref 22–32)
Calcium: 9 mg/dL (ref 8.9–10.3)
Chloride: 98 mmol/L (ref 98–111)
Creatinine, Ser: 0.84 mg/dL (ref 0.44–1.00)
GFR calc Af Amer: >60 mL/min (ref >60)
GFR calc non Af Amer: >60 mL/min (ref >60)
Glucose, Bld: 110 mg/dL — ABNORMAL HIGH (ref 70–99)
Potassium: 4.8 mmol/L (ref 3.5–5.1)
Sodium: 136 mmol/L (ref 135–145)

## 2019-06-29 MED ORDER — TORSEMIDE 20 MG PO TABS
40.0000 mg | ORAL_TABLET | Freq: Every day | ORAL | Status: DC
  Administered 2019-06-29 – 2019-07-02 (×4): 40 mg via ORAL
  Filled 2019-06-29 (×4): qty 2

## 2019-06-29 MED ORDER — MILRINONE LACTATE IN DEXTROSE 20-5 MG/100ML-% IV SOLN
0.1250 ug/kg/min | INTRAVENOUS | Status: DC
  Administered 2019-06-29 – 2019-06-30 (×2): 0.125 ug/kg/min via INTRAVENOUS
  Filled 2019-06-29 (×2): qty 100

## 2019-06-29 MED ORDER — TORSEMIDE 20 MG PO TABS: 20.0000 mg | ORAL_TABLET | Freq: Every day | ORAL | Status: DC

## 2019-06-29 MED ORDER — IVABRADINE HCL 5 MG PO TABS
2.5000 mg | ORAL_TABLET | Freq: Two times a day (BID) | ORAL | Status: DC
  Administered 2019-06-29 – 2019-07-02 (×6): 2.5 mg via ORAL
  Filled 2019-06-29 (×8): qty 1

## 2019-06-29 NOTE — Progress Notes (Signed)
Nutrition Education Note  RD consulted for nutrition education regarding new onset CHF.  RD provided "Low Sodium Nutrition Therapy" handout from the [employer] of [employer]. Reviewed patient's dietary recall. Provided examples on ways to decrease sodium intake in diet. Discouraged intake of processed foods and use of salt shaker. Encouraged fresh fruits and vegetables as well as whole grain sources of carbohydrates to maximize fiber intake.   RD discussed why it is important for patient to adhere to diet recommendations, and emphasized the role of fluids, foods to avoid, and importance of weighing self daily. Teach back method used.  Expect good compliance.  Body mass index is 36.85 kg/m. Pt meets criteria for obesity based on current BMI.  Current diet order is heart healthy, patient is consuming approximately 75% of meals at this time. Labs and medications reviewed. No further nutrition interventions warranted at this time. RD contact information provided. If additional nutrition issues arise, please re-consult RD.   Natalie Baker, RD, LDN, CNSC Please refer to [facility] for contact information.

## 2019-06-29 NOTE — Progress Notes (Signed)
PROGRESS NOTE    Natalie Baker  SWN:[id number] DOB: Aug 02, 1955 DOA: 06/21/2019 PCP: [name], MD   Brief Narrative: 64 year old with past medical history significant for stage IV non-Hodgkin lymphoma, mantle cell lymphoma, hypertension, hypothyroidism who presents complaining of shortness of breath on exertion and evidence of sinus tachycardia.  She was admitted with persistent tachycardia.  She was found to have acute heart failure exacerbation systolic.  Cardiology was consulted.  Patient was also found to have cholecystitis and surgery has been following.  Plan is for conservative management for cholecystitis.  Patient was transferred to [facility] in 6/17 due to V. tach for right and left heart cath.  Patient underwent right and left heart cath on 6/18 which showed normal coronary arteries, severe nonischemic cardiomyopathy ejection fraction 20%, output and cardiogenic shock.  Patient was transferred to the ICU for inotropes.   Assessment & Plan:   Active Problems:   Hyperlipidemia   Hypothyroidism   Morbid obesity ([place]) BMI 35+ with prediabetes, hyperlipidemia, htn   Prediabetes   Mantle cell lymphoma (HCC)   Tachycardia   Acute systolic CHF (congestive heart failure) (HCC)   Acute cholecystitis   1-Systolic Heart Failure Exacerbation, nonischemic cardiomyopathy, cardiogenic shock: Appreciate Dr. Haroldine Laws assistance. On  IV amiodarone, milrinone. Weaning off Milrinone.  Holding beta-blocker. Continue with Cozaar, spironolactone, Digoxin.    Echo with ejection fraction 20 to 25%, severe TR moderate to severe MR. Started on Ivabradine and torsemide  on 6/22.  Negative 8 L. Weight down to 198 from 212 Continue management per cardiology Oxygen sat 71 %  Nonsustained V. Tach: Started on amiodarone  Severe TR/moderate to severe MR: Per cardiology  Biliary colic /elevated liver enzymes Surgery recommending conservative management. Will require elective  cholecystectomy as an outpatient. Her presentation may be related to heart failure. Dr. Donne Hazel with surgery, he does not think that patient has acute cholecystitis, may be biliary colic.  Surgery  recommend short course of 5 days of antibiotics. Tolerating diet.  Discontinue Zosyn 6-20.  Stage IV non-Hodgkin lymphoma, mantle cell lymphoma, Status post R-CHOP with Neulasta Follow with Dr. Earlie Server, conservative management for now.  AKI; likely due to decreased cardiac output  Acute hypoxic respiratory failure: Secondary to acute systolic heart failure exacerbation Treated with IV lasix.  Oxygen supplementations as needed.   Hypothyroidism: Continue with Synthroid  Prediabetes: Diet controlled Hormone deficiency status post bilateral hysterectomy with bilateral oophorectomy On progesterone Hypokalemia; resolved/  Hypomagnesemia; received supplementation.    Estimated body mass index is 36.85 kg/m as calculated from the following:   Height as of this encounter: 5\' 2"  (1.575 m).   Weight as of this encounter: 91.4 kg.   DVT prophylaxis: Lovenox Code Status: Full code Family Communication: Care discussed with patient.  Disposition Plan:  Status is: Inpatient  Remains inpatient appropriate because:Hemodynamically unstable   Dispo: The patient is from: Home              Anticipated d/c is to: Home              Anticipated d/c date is: 3 days              Patient currently is not medically stable to d/c. patient on inotrope.         Consultants:   Cardiology  Surgery  Procedures:  Cath Ultrasound: Gallstones with pericholecystic fluid and wall thickening compatible with acute cholecystitis Antimicrobials:  Zosyn received 5 days treatment.   Subjective: She is feeling well this  morning, she was able to walk in the unit, denies significant shortness of breath. Objective: Vitals:   06/29/19 0700 06/29/19 0752 06/29/19 0800 06/29/19 0914  BP: 111/63  (!)  99/55   Pulse: 76  (!) 111 (!) 117  Resp: 16  18   Temp:  98.2 F (36.8 C)    TempSrc:  Oral    SpO2: (!) 81%  94%   Weight:      Height:        Intake/Output Summary (Last 24 hours) at 06/29/2019 0935 Last data filed at 06/29/2019 0900 Gross per 24 hour  Intake 1045.01 ml  Output 1100 ml  Net -54.99 ml   Filed Weights   06/26/19 0500 06/28/19 0500 06/29/19 0500  Weight: 91.9 kg 90.2 kg 91.4 kg    Examination:  General exam: NAD Respiratory system: CTA Cardiovascular system: S1, S2 tachycardic Gastrointestinal system: Bowel sounds present, soft nontender not distended Central nervous system: Alert following commands Extremities: Symmetric power Skin: no rashes.    Data Reviewed: I have personally reviewed following labs and imaging studies  CBC: Recent Labs  Lab 06/23/19 0332 06/23/19 0332 06/24/19 0500 06/25/19 0315 06/25/19 1039 06/25/19 1045 06/27/19 0420  WBC 8.0  --  7.5 7.4  --   --  5.9  NEUTROABS  --   --  6.1 5.7  --   --   --   HGB 11.7*   < > 11.4* 11.6* 12.9  12.9 11.9* 11.0*  HCT 37.8   < > 36.0 36.7 38.0  38.0 35.0* 35.2*  MCV 92.4  --  91.8 90.4  --   --  90.0  PLT 208  --  181 193  --   --  143*   < > = values in this interval not displayed.   Basic Metabolic Panel: Recent Labs  Lab 06/23/19 0332 06/23/19 0332 06/24/19 0500 06/24/19 2030 06/25/19 0315 06/25/19 1039 06/25/19 1045 06/26/19 0521 06/27/19 0420 06/28/19 0520 06/29/19 0531  NA 137   < > 137   < > 137   < > 143 138 135 137 136  K 3.7   < > 3.0*   < > 3.5   < > 3.0* 3.1* 3.3* 4.2 4.8  CL 107   < > 98   < > 97*  --   --  97* 95* 98 98  CO2 20*   < > 27   < > 28  --   --  29 30 30 28   GLUCOSE 89   < > 96   < > 103*  --   --  124* 112* 113* 110*  BUN 18   < > 19   < > 15  --   --  11 9 12 9   CREATININE 0.98   < > 1.14*   < > 1.12*  --   --  1.08* 1.07* 1.10* 0.84  CALCIUM 8.0*   < > 8.1*   < > 8.4*  --   --  8.6* 8.5* 8.7* 9.0  MG 2.0  --  1.8  --  2.0  --   --  1.7 1.7   --   --   PHOS  --   --  3.7  --  3.8  --   --   --   --   --   --    < > = values in this interval not displayed.   GFR: Estimated Creatinine Clearance: 72.1 mL/min (by C-G  formula based on SCr of 0.84 mg/dL). Liver Function Tests: Recent Labs  Lab 06/23/19 0332 06/24/19 0500 06/24/19 2030 06/25/19 0315  AST 293* 128* 132* 121*  ALT 320* 256* 273* 249*  ALKPHOS 64 73 98 88  BILITOT 0.6 0.7 0.4 0.6  PROT 5.5* 5.7* 6.1* 5.5*  ALBUMIN 3.4* 3.5 3.6 3.3*   No results for input(s): LIPASE, AMYLASE in the last 168 hours. No results for input(s): AMMONIA in the last 168 hours. Coagulation Profile: No results for input(s): INR, PROTIME in the last 168 hours. Cardiac Enzymes: No results for input(s): CKTOTAL, CKMB, CKMBINDEX, TROPONINI in the last 168 hours. BNP (last 3 results) No results for input(s): PROBNP in the last 8760 hours. HbA1C: No results for input(s): HGBA1C in the last 72 hours. CBG: Recent Labs  Lab 06/26/19 0733  GLUCAP 117*   Lipid Profile: No results for input(s): CHOL, HDL, LDLCALC, TRIG, CHOLHDL, LDLDIRECT in the last 72 hours. Thyroid Function Tests: No results for input(s): TSH, T4TOTAL, FREET4, T3FREE, THYROIDAB in the last 72 hours. Anemia Panel: No results for input(s): VITAMINB12, FOLATE, FERRITIN, TIBC, IRON, RETICCTPCT in the last 72 hours. Sepsis Labs: Recent Labs  Lab 06/24/19 1956 06/25/19 0105  LATICACIDVEN 2.0* 1.2    Recent Results (from the past 240 hour(s))  SARS Coronavirus 2 by RT PCR (hospital order, performed in [facility] hospital lab) Nasopharyngeal Nasopharyngeal Swab     Status: None   Collection Time: 06/21/19  5:21 PM   Specimen: Nasopharyngeal Swab  Result Value Ref Range Status   SARS Coronavirus 2 NEGATIVE NEGATIVE Final    Comment: (NOTE) SARS-CoV-2 target nucleic acids are NOT DETECTED.  The SARS-CoV-2 RNA is generally detectable in upper and lower respiratory specimens during the acute phase of infection. The  lowest concentration of SARS-CoV-2 viral copies this assay can detect is 250 copies / mL. A negative result does not preclude SARS-CoV-2 infection and should not be used as the sole basis for treatment or other patient management decisions.  A negative result may occur with improper specimen collection / handling, submission of specimen other than nasopharyngeal swab, presence of viral mutation(s) within the areas targeted by this assay, and inadequate number of viral copies (<250 copies / mL). A negative result must be combined with clinical observations, patient history, and epidemiological information.  Fact Sheet for Patients:   StrictlyIdeas.no  Fact Sheet for Healthcare Providers: BankingDealers.co.za  This test is not yet approved or  cleared by the [place] FDA and has been authorized for detection and/or diagnosis of SARS-CoV-2 by FDA under an Emergency Use Authorization (EUA).  This EUA will remain in effect (meaning this test can be used) for the duration of the COVID-19 declaration under Section 564(b)(1) of the Act, 21 U.S.C. section 360bbb-3(b)(1), unless the authorization is terminated or revoked sooner.  Performed at [facility], [place] [address], [place], [place] [address]   Surgical pcr screen     Status: None   Collection Time: 06/24/19  7:42 PM   Specimen: Nasal Mucosa; Nasal Swab  Result Value Ref Range Status   MRSA, PCR NEGATIVE NEGATIVE Final   Staphylococcus aureus NEGATIVE NEGATIVE Final    Comment: (NOTE) The Xpert SA Assay (FDA approved for NASAL specimens in patients 11 years of age and older), is one component of a comprehensive surveillance program. It is not intended to diagnose infection nor to guide or monitor treatment. Performed at [facility], [place] [address], [place], [place] [address]  Radiology Studies: No results found.      Scheduled  Meds: . Chlorhexidine Gluconate Cloth  6 each Topical Daily  . digoxin  0.125 mg Oral Daily  . enoxaparin (LOVENOX) injection  40 mg Subcutaneous Q24H  . losartan  12.5 mg Oral BID  . magnesium oxide  400 mg Oral BID  . pantoprazole  40 mg Oral Daily  . progesterone  100 mg Oral Daily  . sodium chloride flush  3 mL Intravenous Q12H  . sodium chloride flush  3 mL Intravenous Q12H  . spironolactone  25 mg Oral Daily  . thyroid  90 mg Oral QAC breakfast   Continuous Infusions: . sodium chloride    . amiodarone 30 mg/hr (06/29/19 0900)  . milrinone 0.25 mcg/kg/min (06/29/19 0900)     LOS: 7 days    Time spent: 35 minutes.     Elmarie Shiley, MD [facility]   If 7PM-7AM, please contact night-coverage [email or web address]  06/29/2019, 9:35 AM

## 2019-06-29 NOTE — Progress Notes (Addendum)
Advanced Heart Failure Rounding Note   Subjective:    Cath 6/18 with normal cors. Low output.   Yesterday milrinone was cut back to 0.125 mcg. CO-OX 70.5%.   Remains on amio 30 mg per hour.   Feeling better. Able to walk around unit. Denies SOB.    Objective:   Weight Range:  Vital Signs:   Temp:  [97.7 F (36.5 C)-98.4 F (36.9 C)] 98.2 F (36.8 C) (06/22 0752) Pulse Rate:  [76-124] 115 (06/22 1000) Resp:  [14-30] 17 (06/22 1000) BP: (85-117)/(47-83) 115/80 (06/22 1000) SpO2:  [81 %-98 %] 94 % (06/22 1000) Weight:  [91.4 kg] 91.4 kg (06/22 0500) Last BM Date: 06/28/19  Weight change: Filed Weights   06/26/19 0500 06/28/19 0500 06/29/19 0500  Weight: 91.9 kg 90.2 kg 91.4 kg    Intake/Output:   Intake/Output Summary (Last 24 hours) at 06/29/2019 1018 Last data filed at 06/29/2019 1000 Gross per 24 hour  Intake 1045.01 ml  Output 1100 ml  Net -54.99 ml   CVP 10.  General: In bed.  No resp difficulty HEENT: normal Neck: supple. JVP 9-10 .  Carotids 2+ bilat; no bruits. No lymphadenopathy or thryomegaly appreciated. RIJ  Cor: PMI nondisplaced. Regular rate & rhythm. No rubs, or murmurs.  + S3 . R upper chest porta cath  Lungs: clear Abdomen: soft, nontender, nondistended. No hepatosplenomegaly. No bruits or masses. Good bowel sounds. Extremities: no cyanosis, clubbing, rash, edema Neuro: alert & orientedx3, cranial nerves grossly intact. moves all 4 extremities w/o difficulty. Affect pleasant  Telemetry: Sinus Tach 110s with PVCs   Labs: Basic Metabolic Panel: Recent Labs  Lab 06/23/19 0332 06/23/19 0332 06/24/19 0500 06/24/19 2030 06/25/19 0315 06/25/19 0315 06/25/19 1039 06/25/19 1045 06/26/19 0521 06/26/19 0521 06/27/19 0420 06/28/19 0520 06/29/19 0531  NA 137   < > 137   < > 137  --    < > 143 138  --  135 137 136  K 3.7   < > 3.0*   < > 3.5  --    < > 3.0* 3.1*  --  3.3* 4.2 4.8  CL 107   < > 98   < > 97*  --   --   --  97*  --  95* 98 98   CO2 20*   < > 27   < > 28  --   --   --  29  --  30 30 28   GLUCOSE 89   < > 96   < > 103*  --   --   --  124*  --  112* 113* 110*  BUN 18   < > 19   < > 15  --   --   --  11  --  9 12 9   CREATININE 0.98   < > 1.14*   < > 1.12*  --   --   --  1.08*  --  1.07* 1.10* 0.84  CALCIUM 8.0*   < > 8.1*   < > 8.4*   < >  --   --  8.6*   < > 8.5* 8.7* 9.0  MG 2.0  --  1.8  --  2.0  --   --   --  1.7  --  1.7  --   --   PHOS  --   --  3.7  --  3.8  --   --   --   --   --   --   --   --    < > =  values in this interval not displayed.    Liver Function Tests: Recent Labs  Lab 06/23/19 0332 06/24/19 0500 06/24/19 2030 06/25/19 0315  AST 293* 128* 132* 121*  ALT 320* 256* 273* 249*  ALKPHOS 64 73 98 88  BILITOT 0.6 0.7 0.4 0.6  PROT 5.5* 5.7* 6.1* 5.5*  ALBUMIN 3.4* 3.5 3.6 3.3*   No results for input(s): LIPASE, AMYLASE in the last 168 hours. No results for input(s): AMMONIA in the last 168 hours.  CBC: Recent Labs  Lab 06/23/19 0332 06/23/19 0332 06/24/19 0500 06/25/19 0315 06/25/19 1039 06/25/19 1045 06/27/19 0420  WBC 8.0  --  7.5 7.4  --   --  5.9  NEUTROABS  --   --  6.1 5.7  --   --   --   HGB 11.7*   < > 11.4* 11.6* 12.9  12.9 11.9* 11.0*  HCT 37.8   < > 36.0 36.7 38.0  38.0 35.0* 35.2*  MCV 92.4  --  91.8 90.4  --   --  90.0  PLT 208  --  181 193  --   --  143*   < > = values in this interval not displayed.    Cardiac Enzymes: No results for input(s): CKTOTAL, CKMB, CKMBINDEX, TROPONINI in the last 168 hours.  BNP: BNP (last 3 results) Recent Labs    06/23/19 0332  BNP 1,170.4*    ProBNP (last 3 results) No results for input(s): PROBNP in the last 8760 hours.    Other results:  Imaging: No results found.   Medications:     Scheduled Medications: . Chlorhexidine Gluconate Cloth  6 each Topical Daily  . digoxin  0.125 mg Oral Daily  . enoxaparin (LOVENOX) injection  40 mg Subcutaneous Q24H  . losartan  12.5 mg Oral BID  . magnesium oxide  400 mg  Oral BID  . pantoprazole  40 mg Oral Daily  . progesterone  100 mg Oral Daily  . sodium chloride flush  3 mL Intravenous Q12H  . sodium chloride flush  3 mL Intravenous Q12H  . spironolactone  25 mg Oral Daily  . thyroid  90 mg Oral QAC breakfast    Infusions: . sodium chloride    . amiodarone 30 mg/hr (06/29/19 1000)  . milrinone      PRN Medications: sodium chloride, acetaminophen, alum & mag hydroxide-simeth, morphine injection, ondansetron (ZOFRAN) IV, ondansetron **OR** [DISCONTINUED] ondansetron (ZOFRAN) IV, polyethylene glycol, sodium chloride flush, sodium chloride flush   Assessment/Progress:   1. Acute systolic HF with severe biventricular HF cardiogenic shock - Echo EF 20-25%  moderately reduced RV function with severe MR/TR - most likely from R-CHOP - Cath 06/25/19 no CAD. Low output. Milrinone started - CO-OX 70%. Cut back milrinone 0.125 mcg.  - CVP 10 . Start torsemide 40 mg daily.  - Keep  off b-blocker - Continue digoxin and spiro. Continue spiro to 25 daily - Contnue losartan 12.5 bid. BP too soft too titrate or switch too Entresto  - Renal function stable.   2. VT - Improved. keep K> 4.0 Mg > 2.0 - Continue IV amio while on milrinone - Life Vest order faxed.   3. Rt abd pain with acute cholecystitis. - suspect this is due to RV failure - [facility] following - Completed abx - should improve with Rx of HF  4. Stage IV non-Hodgkin's lymphoma, mantle cell lymphoma - s/p R-CHOP w/ Neulasta - per Dr Earlie Server, conservative mgt for now  5. AKI -  likely due to low ouptut - resolved  6. Transaminitis - suspect shock liver/RV failure  Order faxed for [place]. Consult cardiac rehab.   Length of Stay: 7   Amy Clegg\NP-C  06/29/2019, 10:18 AM  Advanced Heart Failure Team Pager [phone] (M-F; [place])  Please contact [facility] for night-coverage after hours (4p -7a ) and weekends on [email or web address]  Patient seen and examined with the  above-signed Advanced Practice Provider and/or Housestaff. I personally reviewed laboratory data, imaging studies and relevant notes. I independently examined the patient and formulated the important aspects of the plan. I have edited the note to reflect any of my changes or salient points. I have personally discussed the plan with the patient and/or family.  Remains on milrinone. Luiz Blare out yesterday. Feeling better. Able to walk unit. Co-ox 70% on milrinone 0.25. CVP 10. HR still high   General:  Sitting up in bed  No resp difficulty HEENT: normal Neck: supple. JVP jaw Carotids 2+ bilat; no bruits. No lymphadenopathy or thryomegaly appreciated. Cor: PMI nondisplaced. Regular tachy No rubs, gallops or murmurs. Lungs: clear Abdomen: soft, nontender, nondistended. No hepatosplenomegaly. No bruits or masses. Good bowel sounds. Extremities: no cyanosis, clubbing, rash, tr edema Neuro: alert & orientedx3, cranial nerves grossly intact. moves all 4 extremities w/o difficulty. Affect pleasant  Continue to wean milrinone. Start oral torsemide. SBP in the 80s early this am but now 110. Titrate GDMT as tolerated. Will start low-dose ivabradine. Can go to [facility] when bed available.   Glori Bickers, MD  12:25 PM

## 2019-06-29 NOTE — TOC Progression Note (Signed)
Transition of Care [facility]) - Progression Note    Patient Details  Name: Natalie Baker MRN: [id number] Date of Birth: Sep 18, 1955  Transition of Care [facility]) CM/SW [employer], RN Phone Number: 06/29/2019, 12:55 PM  Clinical Narrative:    Case Management met with Natalie Baker, rep for [facility]  - given clinicals and demographics to obtain Life Vest for the patient.   Expected Discharge Plan: [place] Barriers to Discharge: No [facility] will accept this patient  Expected Discharge Plan and Services Expected Discharge Plan: [place]   Discharge Planning Services: CM Consult   Living arrangements for the past 2 months: Single Family Home                           HH Arranged: PT, OT, Nurse's Aide, RN [place] Agency: [facility] [facility] [employer] Date [place]: 06/23/19 Time [place]: [place] Representative spoke with at [place]: [place] ([place]) Interventions    Readmission Risk Interventions No flowsheet data found.

## 2019-06-30 LAB — MAGNESIUM: Magnesium: 1.8 mg/dL (ref 1.7–2.4)

## 2019-06-30 LAB — DIGOXIN LEVEL: Digoxin Level: 0.2 ng/mL — ABNORMAL LOW (ref 0.8–2.0)

## 2019-06-30 LAB — BASIC METABOLIC PANEL
Anion gap: 13 (ref 5–15)
BUN: 11 mg/dL (ref 8–23)
CO2: 30 mmol/L (ref 22–32)
Calcium: 9.2 mg/dL (ref 8.9–10.3)
Chloride: 95 mmol/L — ABNORMAL LOW (ref 98–111)
Creatinine, Ser: 0.99 mg/dL (ref 0.44–1.00)
GFR calc Af Amer: >60 mL/min (ref >60)
GFR calc non Af Amer: >60 mL/min (ref >60)
Glucose, Bld: 100 mg/dL — ABNORMAL HIGH (ref 70–99)
Potassium: 3.6 mmol/L (ref 3.5–5.1)
Sodium: 138 mmol/L (ref 135–145)

## 2019-06-30 LAB — CBC
HCT: 39.6 % (ref 36.0–46.0)
Hemoglobin: 12.5 g/dL (ref 12.0–15.0)
MCH: 28.3 pg (ref 26.0–34.0)
MCHC: 31.6 g/dL (ref 30.0–36.0)
MCV: 89.6 fL (ref 80.0–100.0)
Platelets: 185 10*3/uL (ref 150–400)
RBC: 4.42 MIL/uL (ref 3.87–5.11)
RDW: 15.1 % (ref 11.5–15.5)
WBC: 7.5 10*3/uL (ref 4.0–10.5)
nRBC: 0 % (ref 0.0–0.2)

## 2019-06-30 LAB — COOXEMETRY PANEL
Carboxyhemoglobin: 1.2 % (ref 0.5–1.5)
Methemoglobin: 0.7 % (ref 0.0–1.5)
O2 Saturation: 60.7 %
Total hemoglobin: 12.6 g/dL (ref 12.0–16.0)

## 2019-06-30 MED ORDER — AMIODARONE HCL 200 MG PO TABS
400.0000 mg | ORAL_TABLET | Freq: Two times a day (BID) | ORAL | Status: DC
  Administered 2019-06-30 – 2019-07-02 (×5): 400 mg via ORAL
  Filled 2019-06-30 (×5): qty 2

## 2019-06-30 NOTE — Progress Notes (Signed)
Pt had 18 runs of vtach. Pt asymptomatic. Cardiology paged.

## 2019-06-30 NOTE — Progress Notes (Addendum)
Advanced Heart Failure Rounding Note   Subjective:    Cath 6/18 with normal cors. Low output.   Weaning milrinone, now at 0.125. Co-ox 61%.   Remains on amio 30 mg per hour.   Good overall diuresis. Net I/Os -11.6 L. Back on PO diuretics. Wt stable.   Started on ivabradine 2.5 bid yesterday. HR in the 90s  Sitting up on side of bed eating lunch. Feels well. No complaints. Asking when she can go home. Daughter at bedside.   LifeVest not yet delivered.     Objective:   Weight Range:  Vital Signs:   Temp:  [97.9 F (36.6 C)-98.2 F (36.8 C)] 98.1 F (36.7 C) (06/23 0801) Pulse Rate:  [90-117] 98 (06/23 1100) Resp:  [14-21] 17 (06/23 1100) BP: (97-115)/(48-77) 100/65 (06/23 1100) SpO2:  [90 %-100 %] 96 % (06/23 1100) Weight:  [90.8 kg] 90.8 kg (06/23 0055) Last BM Date: 06/28/19  Weight change: Filed Weights   06/28/19 0500 06/29/19 0500 06/30/19 0055  Weight: 90.2 kg 91.4 kg 90.8 kg    Intake/Output:   Intake/Output Summary (Last 24 hours) at 06/30/2019 1213 Last data filed at 06/30/2019 1045 Gross per 24 hour  Intake 544.39 ml  Output 3050 ml  Net -2505.61 ml   PHYSICAL EXAM: General:  Well appearing, moderately obese. No respiratory difficulty HEENT: normal Neck: supple. no JVD. Carotids 2+ bilat; no bruits. No lymphadenopathy or thyromegaly appreciated. Cor: PMI nondisplaced. Regular rate & rhythm. No rubs, gallops or murmurs. Lungs: clear Abdomen: soft, nontender, nondistended. No hepatosplenomegaly. No bruits or masses. Good bowel sounds. Extremities: no cyanosis, clubbing, rash, edema, + ted hoses  Neuro: alert & oriented x 3, cranial nerves grossly intact. moves all 4 extremities w/o difficulty. Affect pleasant.   Telemetry: NSR 90s, 2 brief runs of NSVT ~5 beats  Labs: Basic Metabolic Panel: Recent Labs  Lab 06/24/19 0500 06/24/19 2030 06/25/19 0315 06/25/19 1039 06/26/19 0521 06/26/19 0521 06/27/19 0420 06/27/19 0420 06/28/19 0520  06/29/19 0531 06/30/19 0430  NA 137   < > 137   < > 138  --  135  --  137 136 138  K 3.0*   < > 3.5   < > 3.1*  --  3.3*  --  4.2 4.8 3.6  CL 98   < > 97*  --  97*  --  95*  --  98 98 95*  CO2 27   < > 28  --  29  --  30  --  30 28 30   GLUCOSE 96   < > 103*  --  124*  --  112*  --  113* 110* 100*  BUN 19   < > 15  --  11  --  9  --  12 9 11   CREATININE 1.14*   < > 1.12*  --  1.08*  --  1.07*  --  1.10* 0.84 0.99  CALCIUM 8.1*   < > 8.4*  --  8.6*   < > 8.5*   < > 8.7* 9.0 9.2  MG 1.8  --  2.0  --  1.7  --  1.7  --   --   --  1.8  PHOS 3.7  --  3.8  --   --   --   --   --   --   --   --    < > = values in this interval not displayed.    Liver Function Tests: Recent  Labs  Lab 06/24/19 0500 06/24/19 2030 06/25/19 0315  AST 128* 132* 121*  ALT 256* 273* 249*  ALKPHOS 73 98 88  BILITOT 0.7 0.4 0.6  PROT 5.7* 6.1* 5.5*  ALBUMIN 3.5 3.6 3.3*   No results for input(s): LIPASE, AMYLASE in the last 168 hours. No results for input(s): AMMONIA in the last 168 hours.  CBC: Recent Labs  Lab 06/24/19 0500 06/24/19 0500 06/25/19 0315 06/25/19 1039 06/25/19 1045 06/27/19 0420 06/30/19 0430  WBC 7.5  --  7.4  --   --  5.9 7.5  NEUTROABS 6.1  --  5.7  --   --   --   --   HGB 11.4*   < > 11.6* 12.9  12.9 11.9* 11.0* 12.5  HCT 36.0   < > 36.7 38.0  38.0 35.0* 35.2* 39.6  MCV 91.8  --  90.4  --   --  90.0 89.6  PLT 181  --  193  --   --  143* 185   < > = values in this interval not displayed.    Cardiac Enzymes: No results for input(s): CKTOTAL, CKMB, CKMBINDEX, TROPONINI in the last 168 hours.  BNP: BNP (last 3 results) Recent Labs    06/23/19 0332  BNP 1,170.4*    ProBNP (last 3 results) No results for input(s): PROBNP in the last 8760 hours.    Other results:  Imaging: No results found.   Medications:     Scheduled Medications: . Chlorhexidine Gluconate Cloth  6 each Topical Daily  . digoxin  0.125 mg Oral Daily  . enoxaparin (LOVENOX) injection  40 mg  Subcutaneous Q24H  . ivabradine  2.5 mg Oral BID WC  . losartan  12.5 mg Oral BID  . magnesium oxide  400 mg Oral BID  . pantoprazole  40 mg Oral Daily  . progesterone  100 mg Oral Daily  . sodium chloride flush  3 mL Intravenous Q12H  . sodium chloride flush  3 mL Intravenous Q12H  . spironolactone  25 mg Oral Daily  . thyroid  90 mg Oral QAC breakfast  . torsemide  40 mg Oral Daily    Infusions: . sodium chloride    . amiodarone 30 mg/hr (06/30/19 0900)  . milrinone 0.125 mcg/kg/min (06/30/19 0537)    PRN Medications: sodium chloride, acetaminophen, alum & mag hydroxide-simeth, morphine injection, ondansetron (ZOFRAN) IV, ondansetron **OR** [DISCONTINUED] ondansetron (ZOFRAN) IV, polyethylene glycol, sodium chloride flush, sodium chloride flush   Assessment/Progress:   1. Acute systolic HF with severe biventricular HF cardiogenic shock - Echo EF 20-25%  moderately reduced RV function with severe MR/TR - most likely from R-CHOP - Cath 06/25/19 no CAD. Low output. Milrinone started - CO-OX 61%  On 0.125 mcg of milrinone.  - Stop milrinone today and follow co-ox  - Volume status ok. Continue torsemide 40 mg daily.  - Keep  off b-blocker - Continue digoxin. Dig level ok  - Continue spiro 25 daily - Contnue losartan 12.5 bid. BP too soft to titrate or switch to [facility]  - Renal function stable.   2. VT - Improved. Keep K> 4.0 Mg > 2.0 - Stopping milrinone today  - Will stop amio gtt and change to PO, 400 mg bid  - Awaiting Life Vest. Order faxed.   3. Rt abd pain with acute cholecystitis. - suspect this is due to RV failure - GSU following - Completed abx - should improve with Rx of HF  4. Stage IV non-Hodgkin's  lymphoma, mantle cell lymphoma - s/p R-CHOP w/ Neulasta - per Dr Earlie Server, conservative mgt for now  5. AKI - likely due to low ouptut - resolved  6. Transaminitis - suspect shock liver/RV failure   Length of Stay: 8   Brittainy  Simmons\NP-C  06/30/2019, 12:13 PM  Advanced Heart Failure Team Pager [phone] (M-F; [place])  Please contact [facility] for night-coverage after hours (4p -7a ) and weekends on [email or web address]   Patient seen and examined with the above-signed Advanced Practice Provider and/or Housestaff. I personally reviewed laboratory data, imaging studies and relevant notes. I independently examined the patient and formulated the important aspects of the plan. I have edited the note to reflect any of my changes or salient points. I have personally discussed the plan with the patient and/or family.  Milrinone turned down to 0.125 yesterday. Co-ox 61% today. Volume status ok. Ivabradine added. HR down to 90s. Tolerating well. Able to ambulate.  General:  Well appearing. No resp difficulty HEENT: normal Neck: supple. no JVD. Carotids 2+ bilat; no bruits. No lymphadenopathy or thryomegaly appreciated. Cor: PMI nondisplaced. Regular rate & rhythm. No rubs, gallops or murmurs. Lungs: clear Abdomen: obese.soft, nontender, nondistended. No hepatosplenomegaly. No bruits or masses. Good bowel sounds. Extremities: no cyanosis, clubbing, rash, edema Neuro: alert & orientedx3, cranial nerves grossly intact. moves all 4 extremities w/o difficulty. Affect pleasant  Agree with stopping milrinone. Need to follow co-ox for 48 hours off inotropes. Having brief NSVT. Keep K> 4.0 Mg > 2.0. Lifevest ordered.   Glori Bickers, MD  4:29 PM

## 2019-06-30 NOTE — Progress Notes (Signed)
PROGRESS NOTE  Natalie Baker  DOB: July 07, 1955  PCP: [name], MD [facility]:[id number]  DOA: 06/21/2019  LOS: 8 days   No chief complaint on file.  Brief narrative: 64 year old with past medical history significant for stage IV non-Hodgkin lymphoma, mantle cell lymphoma, hypertension, hypothyroidism. Patient presented to the ED on 6/14 with complaint of shortness of breath on exertion and evidence of sinus tachycardia.   She was found to be in acute exacerbation of systolic heart failure.  Cardiology was consulted.  She was also found to have V. tach and hence transferred to [facility] in 6/17 for right and left heart cath. Patient underwent right and left heart cath on 6/18 which showed normal coronary arteries, severe nonischemic cardiomyopathy ejection fraction 20%, output and cardiogenic shock. Patient was transferred to the ICU for inotropes.  Subjective: Patient was seen and examined this morning.  Pleasant elderly Caucasian female.  Sitting up at the edge of the bed.  Not in distress.  Remains on milrinone and amiodarone drip. Daughter at bedside.  Chart reviewed Afebrile, heart rate in 90s, blood pressure in low 100s oxygen saturation more than 90% room air Labs with potassium 3.6, creatinine 0.99, magnesium 1.8, WBC 7.5, hemoglobin 12.5 Net diuresis of 2 L in last 24 hours, 11 L since admission 1 pound weight loss 24 hours, 12 pounds since admission.  Assessment/Plan: Systolic Heart Failure Exacerbation Nonischemic cardiomyopathy Cardiogenic shock -Echo with EF 20 to 25%, moderately reduced RV function with severe MR/TR  -most likely related to cancer chemotherapy.   -Cardiac cath in 06/25/2019 did not show any coronary disease.   -Currently on milrinone drip.  Cardiology adjusting the dose. -Also on ivabradine, torsemide, digoxin, losartan and Aldactone. Off beta-blocker. -Not on Entresto because of soft blood pressure. -Renal function is stable. -Net diuresis of 2  L in last 24 hours, 11 L since admission  Acute hypoxic respiratory failure:  -Secondary to acute systolic heart failure exacerbation -Oxygen supplementation as needed.   Currently unknown.  Nonsustained V. Tach: -On amiodarone drip while on milrinone. -Labs this morning with potassium 3.6 and magnesium 1.8. -Replacement to maintain potassium more than 4 and magnesium more than 2 -LifeVest planned.  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 -Surgery recommending conservative management with 5 days of IV antibiotics.  Completed the course of antibiotics on 6/20. -Will require elective cholecystectomy as an outpatient. -Tolerating diet.  Stage IV non-Hodgkin lymphoma, mantle cell lymphoma, -s/p R-CHOP with Neulasta -Follow with Dr. Earlie Server, conservative management for now.  Hypothyroidism: Continue with Synthroid  Prediabetes: Diet controlled  Hormone supplementation status post bilateral hysterectomy with bilateral oophorectomy On progesterone      Body mass index is 36.61 kg/m. Mobility: Encourage ambulation Code Status:   Code Status: Full Code  DVT prophylaxis: enoxaparin (LOVENOX) injection 40 mg Start: 06/26/19 0800 Place TED hose Start: 06/24/19 1956 Place and maintain sequential compression device Start: 06/23/19 1122  Antimicrobials:  Currently not on antibiotics Fluid: No IV fluid  Diet:  Diet Order            Diet Heart Room service appropriate? Yes; Fluid consistency: Thin  Diet effective now                 Consultants: Cardiology Family Communication:  Daughter at bedside  Status is: Inpatient  Remains inpatient appropriate because continues to get diuresed.   Dispo: The patient  is from: Home              Anticipated d/c is to: Home              Anticipated d/c date is: 2 days              Patient currently is  not medically stable to d/c.        Infusions:  . sodium chloride    . amiodarone 30 mg/hr (06/29/19 2100)  . milrinone 0.125 mcg/kg/min (06/30/19 0537)    Scheduled Meds: . Chlorhexidine Gluconate Cloth  6 each Topical Daily  . digoxin  0.125 mg Oral Daily  . enoxaparin (LOVENOX) injection  40 mg Subcutaneous Q24H  . ivabradine  2.5 mg Oral BID WC  . losartan  12.5 mg Oral BID  . magnesium oxide  400 mg Oral BID  . pantoprazole  40 mg Oral Daily  . progesterone  100 mg Oral Daily  . sodium chloride flush  3 mL Intravenous Q12H  . sodium chloride flush  3 mL Intravenous Q12H  . spironolactone  25 mg Oral Daily  . thyroid  90 mg Oral QAC breakfast  . torsemide  40 mg Oral Daily    Antimicrobials: Anti-infectives (From admission, onward)   Start     Dose/Rate Route Frequency Ordered Stop   06/22/19 2000  piperacillin-tazobactam (ZOSYN) IVPB 3.375 g  Status:  Discontinued        3.375 g 12.5 mL/hr over 240 Minutes Intravenous Every 8 hours 06/22/19 1821 06/27/19 1303   06/22/19 1800  cefTRIAXone (ROCEPHIN) 1 g in sodium chloride 0.9 % 100 mL IVPB  Status:  Discontinued        1 g 200 mL/hr over 30 Minutes Intravenous Every 24 hours 06/22/19 0014 06/22/19 1821   06/21/19 1700  cefTRIAXone (ROCEPHIN) 1 g in sodium chloride 0.9 % 100 mL IVPB        1 g 200 mL/hr over 30 Minutes Intravenous  Once 06/21/19 1650 06/21/19 1758      PRN meds: sodium chloride, acetaminophen, alum & mag hydroxide-simeth, morphine injection, ondansetron (ZOFRAN) IV, ondansetron **OR** [DISCONTINUED] ondansetron (ZOFRAN) IV, polyethylene glycol, sodium chloride flush, sodium chloride flush   Objective: Vitals:   06/30/19 0500 06/30/19 0801  BP: 105/65 (!) 100/53  Pulse: 90 98  Resp: 14 19  Temp:  98.1 F (36.7 C)  SpO2: 90% 93%    Intake/Output Summary (Last 24 hours) at 06/30/2019 0818 Last data filed at 06/30/2019 0700 Gross per 24 hour  Intake 510.12 ml  Output 2950 ml  Net -2439.88  ml   Filed Weights   06/28/19 0500 06/29/19 0500 06/30/19 0055  Weight: 90.2 kg 91.4 kg 90.8 kg   Weight change: -0.6 kg Body mass index is 36.61 kg/m.   Physical Exam: General exam: Appears calm and comfortable.  Skin: No rashes, lesions or ulcers. HEENT: Atraumatic, normocephalic, supple neck, no obvious bleeding Lungs: Clear to auscultation bilaterally CVS: Regular rate and rhythm, no murmur GI/Abd soft, nontender, nondistended, bowel sound present CNS: Alert, awake, oriented x3 Psychiatry: Mood appropriate Extremities: No pedal edema, no calf tenderness  Data Review: I have personally reviewed the laboratory data and studies available.  Recent Labs  Lab 06/24/19 0500 06/24/19 0500 06/25/19 0315 06/25/19 1039 06/25/19 1045 06/27/19 0420 06/30/19 0430  WBC 7.5  --  7.4  --   --  5.9 7.5  NEUTROABS 6.1  --  5.7  --   --   --   --  HGB 11.4*   < > 11.6* 12.9  12.9 11.9* 11.0* 12.5  HCT 36.0   < > 36.7 38.0  38.0 35.0* 35.2* 39.6  MCV 91.8  --  90.4  --   --  90.0 89.6  PLT 181  --  193  --   --  143* 185   < > = values in this interval not displayed.   Recent Labs  Lab 06/24/19 0500 06/24/19 2030 06/25/19 0315 06/25/19 1039 06/26/19 0521 06/27/19 0420 06/28/19 0520 06/29/19 0531 06/30/19 0430  NA 137   < > 137   < > 138 135 137 136 138  K 3.0*   < > 3.5   < > 3.1* 3.3* 4.2 4.8 3.6  CL 98   < > 97*  --  97* 95* 98 98 95*  CO2 27   < > 28  --  29 30 30 28 30   GLUCOSE 96   < > 103*  --  124* 112* 113* 110* 100*  BUN 19   < > 15  --  11 9 12 9 11   CREATININE 1.14*   < > 1.12*  --  1.08* 1.07* 1.10* 0.84 0.99  CALCIUM 8.1*   < > 8.4*  --  8.6* 8.5* 8.7* 9.0 9.2  MG 1.8  --  2.0  --  1.7 1.7  --   --  1.8  PHOS 3.7  --  3.8  --   --   --   --   --   --    < > = values in this interval not displayed.    Signed, Terrilee Croak, MD [facility] Pager: [phone] (Secure Chat preferred). 06/30/2019

## 2019-07-01 LAB — BASIC METABOLIC PANEL
Anion gap: 11 (ref 5–15)
BUN: 14 mg/dL (ref 8–23)
CO2: 32 mmol/L (ref 22–32)
Calcium: 9.4 mg/dL (ref 8.9–10.3)
Chloride: 95 mmol/L — ABNORMAL LOW (ref 98–111)
Creatinine, Ser: 1.03 mg/dL — ABNORMAL HIGH (ref 0.44–1.00)
GFR calc Af Amer: >60 mL/min (ref >60)
GFR calc non Af Amer: 58 mL/min — ABNORMAL LOW (ref >60)
Glucose, Bld: 96 mg/dL (ref 70–99)
Potassium: 3.8 mmol/L (ref 3.5–5.1)
Sodium: 138 mmol/L (ref 135–145)

## 2019-07-01 LAB — MAGNESIUM: Magnesium: 2.1 mg/dL (ref 1.7–2.4)

## 2019-07-01 LAB — COOXEMETRY PANEL
Carboxyhemoglobin: 1.4 % (ref 0.5–1.5)
Methemoglobin: 0.7 % (ref 0.0–1.5)
O2 Saturation: 59.7 %
Total hemoglobin: 12.8 g/dL (ref 12.0–16.0)

## 2019-07-01 MED ORDER — POTASSIUM CHLORIDE CRYS ER 20 MEQ PO TBCR
40.0000 meq | EXTENDED_RELEASE_TABLET | Freq: Once | ORAL | Status: AC
  Administered 2019-07-01: 40 meq via ORAL
  Filled 2019-07-01: qty 2

## 2019-07-01 NOTE — Progress Notes (Addendum)
Advanced Heart Failure Rounding Note   Subjective:    Cath 6/18 with normal cors. Low output.   Yesterday milrinone stopped. CO-OX 60%.   Sleepy this morning. Able to walk in the room. Denies SOB.      Objective:   Weight Range:  Vital Signs:   Temp:  [97.8 F (36.6 C)-98.5 F (36.9 C)] 97.9 F (36.6 C) (06/24 0427) Pulse Rate:  [93-99] 93 (06/24 0807) Resp:  [13-19] 18 (06/24 0807) BP: (92-101)/(51-86) 94/64 (06/24 0807) SpO2:  [93 %-99 %] 98 % (06/24 0807) Weight:  [88.6 kg] 88.6 kg (06/24 0319) Last BM Date: 06/30/19  Weight change: Filed Weights   06/29/19 0500 06/30/19 0055 07/01/19 0319  Weight: 91.4 kg 90.8 kg 88.6 kg    Intake/Output:   Intake/Output Summary (Last 24 hours) at 07/01/2019 0959 Last data filed at 07/01/2019 0940 Gross per 24 hour  Intake 600 ml  Output 3750 ml  Net -3150 ml   PHYSICAL EXAM: General: No resp difficulty HEENT: normal Neck: supple. no JVD. Carotids 2+ bilat; no bruits. No lymphadenopathy or thryomegaly appreciated. Cor: PMI nondisplaced. Regular rate & rhythm. No rubs, gallops or murmurs. Porta cath Lungs: clear Abdomen: soft, nontender, nondistended. No hepatosplenomegaly. No bruits or masses. Good bowel sounds. Extremities: no cyanosis, clubbing, rash, edema Neuro: alert & orientedx3, cranial nerves grossly intact. moves all 4 extremities w/o difficulty. Affect pleasant   Telemetry: NSR 80-90s with rare PVCs.  Labs: Basic Metabolic Panel: Recent Labs  Lab 06/25/19 0315 06/25/19 1039 06/26/19 4818 06/26/19 5631 06/27/19 0420 06/27/19 0420 06/28/19 0520 06/28/19 0520 06/29/19 0531 06/30/19 0430 07/01/19 0433 07/01/19 0737  NA 137   < > 138   < > 135  --  137  --  136 138 138  --   K 3.5   < > 3.1*   < > 3.3*  --  4.2  --  4.8 3.6 3.8  --   CL 97*  --  97*   < > 95*  --  98  --  98 95* 95*  --   CO2 28  --  29   < > 30  --  30  --  28 30 32  --   GLUCOSE 103*  --  124*   < > 112*  --  113*  --  110*  100* 96  --   BUN 15  --  11   < > 9  --  12  --  9 11 14   --   CREATININE 1.12*  --  1.08*   < > 1.07*  --  1.10*  --  0.84 0.99 1.03*  --   CALCIUM 8.4*  --  8.6*   < > 8.5*   < > 8.7*   < > 9.0 9.2 9.4  --   MG 2.0  --  1.7  --  1.7  --   --   --   --  1.8  --  2.1  PHOS 3.8  --   --   --   --   --   --   --   --   --   --   --    < > = values in this interval not displayed.    Liver Function Tests: Recent Labs  Lab 06/24/19 2030 06/25/19 0315  AST 132* 121*  ALT 273* 249*  ALKPHOS 98 88  BILITOT 0.4 0.6  PROT 6.1* 5.5*  ALBUMIN 3.6 3.3*  No results for input(s): LIPASE, AMYLASE in the last 168 hours. No results for input(s): AMMONIA in the last 168 hours.  CBC: Recent Labs  Lab 06/25/19 0315 06/25/19 1039 06/25/19 1045 06/27/19 0420 06/30/19 0430  WBC 7.4  --   --  5.9 7.5  NEUTROABS 5.7  --   --   --   --   HGB 11.6* 12.9  12.9 11.9* 11.0* 12.5  HCT 36.7 38.0  38.0 35.0* 35.2* 39.6  MCV 90.4  --   --  90.0 89.6  PLT 193  --   --  143* 185    Cardiac Enzymes: No results for input(s): CKTOTAL, CKMB, CKMBINDEX, TROPONINI in the last 168 hours.  BNP: BNP (last 3 results) Recent Labs    06/23/19 0332  BNP 1,170.4*    ProBNP (last 3 results) No results for input(s): PROBNP in the last 8760 hours.    Other results:  Imaging: No results found.   Medications:     Scheduled Medications: . amiodarone  400 mg Oral BID  . Chlorhexidine Gluconate Cloth  6 each Topical Daily  . digoxin  0.125 mg Oral Daily  . enoxaparin (LOVENOX) injection  40 mg Subcutaneous Q24H  . ivabradine  2.5 mg Oral BID WC  . losartan  12.5 mg Oral BID  . magnesium oxide  400 mg Oral BID  . pantoprazole  40 mg Oral Daily  . progesterone  100 mg Oral Daily  . sodium chloride flush  3 mL Intravenous Q12H  . sodium chloride flush  3 mL Intravenous Q12H  . spironolactone  25 mg Oral Daily  . thyroid  90 mg Oral QAC breakfast  . torsemide  40 mg Oral Daily    Infusions: .  sodium chloride      PRN Medications: sodium chloride, acetaminophen, alum & mag hydroxide-simeth, morphine injection, ondansetron (ZOFRAN) IV, ondansetron **OR** [DISCONTINUED] ondansetron (ZOFRAN) IV, polyethylene glycol, sodium chloride flush, sodium chloride flush   Assessment/Progress:   1. Acute systolic HF with severe biventricular HF cardiogenic shock - Echo EF 20-25%  moderately reduced RV function with severe MR/TR - most likely from R-CHOP - Cath 06/25/19 no CAD. Low output. Milrinone started -CO-OX 60% . Volume status stable.  Continue torsemide 40 mg daily.  - Keep  off b-blocker - Continue digoxin. Dig level ok  - Continue spiro 25 daily - Contnue losartan 12.5 bid. BP too soft to titrate or switch to [facility]  -Renal function stable.   2. VT - Improved. Keep K> 4.0 Mg > 2.0 - Stopping milrinone today  - Continue amio 40 mg twice a day.   - Awaiting Life Vest. Order faxed.   3. Rt abd pain with acute cholecystitis. - suspect this is due to RV failure - GSU following - Completed abx - should improve with Rx of HF  4. Stage IV non-Hodgkin's lymphoma, mantle cell lymphoma - s/p R-CHOP w/ Neulasta - per Dr Earlie Server, conservative mgt for now  5. AKI - likely due to low output.  - Stable today.   6. Transaminitis - suspect shock liver/RV failure  Needs another day off milrinone.    Length of Stay: 9   Amy Clegg\NP-C  07/01/2019, 9:59 AM  Advanced Heart Failure Team Pager [phone] (M-F; [place])  Please contact [facility] for night-coverage after hours (4p -7a ) and weekends on [email or web address]  Patient seen and examined with the above-signed Advanced Practice Provider and/or Housestaff. I personally reviewed laboratory data, imaging studies  and relevant notes. I independently examined the patient and formulated the important aspects of the plan. I have edited the note to reflect any of my changes or salient points. I have personally discussed the  plan with the patient and/or family.  Feels ok. Off milrinone x 24 hours. Volume status and co-ox ok. HR improved.   General:  Well appearing. No resp difficulty HEENT: normal Neck: supple. no JVD. Carotids 2+ bilat; no bruits. No lymphadenopathy or thryomegaly appreciated. Cor: PMI nondisplaced. Regular rate & rhythm. No rubs, gallops or murmurs. Lungs: clear Abdomen: obese soft, nontender, nondistended. No hepatosplenomegaly. No bruits or masses. Good bowel sounds. Extremities: no cyanosis, clubbing, rash, edema Neuro: alert & orientedx3, cranial nerves grossly intact. moves all 4 extremities w/o difficulty. Affect pleasant  Stable off milrinone. Will watch for 24 hours more. If co-ox stable can d/c home tomorrow am.   Glori Bickers, MD  11:55 AM

## 2019-07-01 NOTE — Progress Notes (Signed)
PROGRESS NOTE  Natalie Baker  DOB: 1955-12-07  PCP: [name], MD ZOX:[id number]  DOA: 06/21/2019  LOS: 9 days   No chief complaint on file.  Brief narrative: 64 year old with past medical history significant for stage IV non-Hodgkin lymphoma, mantle cell lymphoma, hypertension, hypothyroidism. Patient presented to the ED on 6/14 with complaint of shortness of breath on exertion and evidence of sinus tachycardia.   She was found to be in acute exacerbation of systolic heart failure.  Cardiology was consulted.  She was also found to have V. tach and hence transferred to [facility] in 6/17 for right and left heart cath. Patient underwent right and left heart cath on 6/18 which showed normal coronary arteries, severe nonischemic cardiomyopathy ejection fraction 20%, output and cardiogenic shock. Patient was transferred to the ICU for inotropes.  Subjective: Patient was seen and examined this morning.  Pleasant elderly Caucasian female.  Lying on bed.  Not in distress. milrinone and amiodarone drip were stopped by cardiology  Chart reviewed Afebrile, heart rate 90s, blood pressure is also in the 90s and 100s.   Potassium 3.8 oral 40 mEq replacement ordered.    Assessment/Plan: Systolic Heart Failure Exacerbation Nonischemic cardiomyopathy Cardiogenic shock -Echo with EF 20 to 25%, moderately reduced RV function with severe MR/TR  -most likely related to cancer chemotherapy.   -Cardiac cath in 06/25/2019 did not show any coronary disease.   -milrinone drip was used. -Currently on ivabradine, torsemide, digoxin, losartan and Aldactone. Off beta-blocker. -Not on Entresto because of soft blood pressure. -Renal function is stable. -Net diuresis of 3L last 24 hours, 14 L since admission -Need weight loss of 5 pounds in 24 hours and 17 pounds since admission  Acute hypoxic respiratory failure:  -Secondary to acute systolic heart failure exacerbation -Oxygen supplementation as  needed.   Currently unknown.  Nonsustained V. Tach: -Was Given on amiodarone drip while on milrinone. -Replacements of potassium and magnesium given to maintain potassium more than 4 and magnesium more than 2 -LifeVest planned.  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 -Surgery recommended conservative management with 5 days of IV antibiotics.  -Completed the course of antibiotics on 6/20. -Will require elective cholecystectomy as an outpatient. -Tolerating diet.  Follow-up with general surgery as an outpatient. -Elevated liver enzymes are likely secondary to right heart failure and are improving.  Stage IV non-Hodgkin lymphoma, mantle cell lymphoma, -s/p R-CHOP with Neulasta -Follows with Dr. Earlie Server, conservative management for now.  Hypothyroidism: Continue with Synthroid  Prediabetes: Diet controlled  Hormone supplementation status post bilateral hysterectomy with bilateral oophorectomy On progesterone     Body mass index is 35.74 kg/m. Mobility: Encourage ambulation Code Status:   Code Status: Full Code  DVT prophylaxis: enoxaparin (LOVENOX) injection 40 mg Start: 06/26/19 0800 Place TED hose Start: 06/24/19 1956 Place and maintain sequential compression device Start: 06/23/19 1122  Antimicrobials:  Currently not on antibiotics Fluid: No IV fluid  Diet:  Diet Order            Diet Heart Room service appropriate? Yes; Fluid consistency: Thin  Diet effective now                 Consultants: Cardiology Family Communication:  None at bedside.  Status is: Inpatient  Remains inpatient appropriate because continues to get diuresed.   Dispo: The patient is from: Home  Anticipated d/c is to: Home              Anticipated d/c date is: Hopefully home tomorrow.              Patient currently is not medically stable to d/c.   24 more hours of observation off Milrinone drip per cardiology.   Infusions:  . sodium chloride      Scheduled Meds: . amiodarone  400 mg Oral BID  . Chlorhexidine Gluconate Cloth  6 each Topical Daily  . digoxin  0.125 mg Oral Daily  . enoxaparin (LOVENOX) injection  40 mg Subcutaneous Q24H  . ivabradine  2.5 mg Oral BID WC  . losartan  12.5 mg Oral BID  . magnesium oxide  400 mg Oral BID  . pantoprazole  40 mg Oral Daily  . potassium chloride  40 mEq Oral Once  . progesterone  100 mg Oral Daily  . sodium chloride flush  3 mL Intravenous Q12H  . sodium chloride flush  3 mL Intravenous Q12H  . spironolactone  25 mg Oral Daily  . thyroid  90 mg Oral QAC breakfast  . torsemide  40 mg Oral Daily    Antimicrobials: Anti-infectives (From admission, onward)   Start     Dose/Rate Route Frequency Ordered Stop   06/22/19 2000  piperacillin-tazobactam (ZOSYN) IVPB 3.375 g  Status:  Discontinued        3.375 g 12.5 mL/hr over 240 Minutes Intravenous Every 8 hours 06/22/19 1821 06/27/19 1303   06/22/19 1800  cefTRIAXone (ROCEPHIN) 1 g in sodium chloride 0.9 % 100 mL IVPB  Status:  Discontinued        1 g 200 mL/hr over 30 Minutes Intravenous Every 24 hours 06/22/19 0014 06/22/19 1821   06/21/19 1700  cefTRIAXone (ROCEPHIN) 1 g in sodium chloride 0.9 % 100 mL IVPB        1 g 200 mL/hr over 30 Minutes Intravenous  Once 06/21/19 1650 06/21/19 1758      PRN meds: sodium chloride, acetaminophen, alum & mag hydroxide-simeth, morphine injection, ondansetron (ZOFRAN) IV, ondansetron **OR** [DISCONTINUED] ondansetron (ZOFRAN) IV, polyethylene glycol, sodium chloride flush, sodium chloride flush   Objective: Vitals:   07/01/19 0500 07/01/19 0807  BP:  94/64  Pulse:  93  Resp: 15 18  Temp:    SpO2: 93% 98%    Intake/Output Summary (Last 24 hours) at 07/01/2019 1133 Last data filed at 07/01/2019 0940 Gross per 24 hour  Intake 600 ml  Output 3450 ml  Net -2850 ml   Filed Weights    06/29/19 0500 06/30/19 0055 07/01/19 0319  Weight: 91.4 kg 90.8 kg 88.6 kg   Weight change: -2.167 kg Body mass index is 35.74 kg/m.   Physical Exam: General exam: Appears calm and comfortable.  Skin: No rashes, lesions or ulcers. HEENT: Atraumatic, normocephalic, supple neck, no obvious bleeding Lungs: Clear to auscultation bilaterally, no crackles or wheezing. CVS: Regular rate and rhythm, no murmur GI/Abd soft, nontender, nondistended, bowel sound present CNS: Alert, awake, oriented x3 Psychiatry: Mood appropriate Extremities: No pedal edema, no calf tenderness  Data Review: I have personally reviewed the laboratory data and studies available.  Recent Labs  Lab 06/25/19 0315 06/25/19 1039 06/25/19 1045 06/27/19 0420 06/30/19 0430  WBC 7.4  --   --  5.9 7.5  NEUTROABS 5.7  --   --   --   --   HGB 11.6* 12.9  12.9 11.9* 11.0* 12.5  HCT 36.7 38.0  38.0 35.0* 35.2* 39.6  MCV 90.4  --   --  90.0 89.6  PLT 193  --   --  143* 185   Recent Labs  Lab 06/25/19 0315 06/25/19 1039 06/26/19 0521 06/26/19 0521 06/27/19 0420 06/28/19 0520 06/29/19 0531 06/30/19 0430 07/01/19 0433 07/01/19 0737  NA 137   < > 138   < > 135 137 136 138 138  --   K 3.5   < > 3.1*   < > 3.3* 4.2 4.8 3.6 3.8  --   CL 97*  --  97*   < > 95* 98 98 95* 95*  --   CO2 28  --  29   < > 30 30 28 30  32  --   GLUCOSE 103*  --  124*   < > 112* 113* 110* 100* 96  --   BUN 15  --  11   < > 9 12 9 11 14   --   CREATININE 1.12*  --  1.08*   < > 1.07* 1.10* 0.84 0.99 1.03*  --   CALCIUM 8.4*  --  8.6*   < > 8.5* 8.7* 9.0 9.2 9.4  --   MG 2.0  --  1.7  --  1.7  --   --  1.8  --  2.1  PHOS 3.8  --   --   --   --   --   --   --   --   --    < > = values in this interval not displayed.    Signed, Terrilee Croak, MD [facility] Pager: [phone] (Secure Chat preferred). 07/01/2019

## 2019-07-01 NOTE — Progress Notes (Signed)
Nutrition Education Note  RD consulted for nutrition education regarding CHF.  RD provided diet education on 06/29/19. RD attempted to speak with pt via phone, however, no answer.   RD provided "Low Sodium Nutrition Therapy" handout from the Academy of Nutrition and Dietetics. Handout attached to AVS/ discharge instructions.   Current diet order is Heart Healthy, patient is consuming approximately 100% of meals at this time. Labs and medications reviewed. No further nutrition interventions warranted at this time. RD contact information provided. If additional nutrition issues arise, please re-consult RD.   Loistine Chance, RD, LDN, [place] Registered Dietitian II Certified Diabetes Care and Education Specialist Please refer to [facility] for RD and/or RD on-call/weekend/after hours pager

## 2019-07-01 NOTE — Progress Notes (Signed)
This note also relates to the following rows which could not be included: Pulse Rate - Cannot attach notes to unvalidated device data

## 2019-07-01 NOTE — Discharge Instructions (Addendum)
Gallbladder Eating Plan If you have a gallbladder condition, you may have trouble digesting fats. Eating a low-fat diet can help reduce your symptoms, and may be helpful before and after having surgery to remove your gallbladder (cholecystectomy). Your health care provider may recommend that you work with a diet and nutrition specialist (dietitian) to help you reduce the amount of fat in your diet. What are tips for following this plan? General guidelines  Limit your fat intake to less than 30% of your total daily calories. If you eat around 1,800 calories each day, this is less than 60 grams (g) of fat per day.  Fat is an important part of a healthy diet. Eating a low-fat diet can make it hard to maintain a healthy body weight. Ask your dietitian how much fat, calories, and other nutrients you need each day.  Eat small, frequent meals throughout the day instead of three large meals.  Drink at least 8-10 cups of fluid a day. Drink enough fluid to keep your urine clear or pale yellow.  Limit alcohol intake to no more than 1 drink a day for nonpregnant women and 2 drinks a day for men. One drink equals 12 oz of beer, 5 oz of wine, or 1 oz of hard liquor. Reading food labels  Check Nutrition Facts on food labels for the amount of fat per serving. Choose foods with less than 3 grams of fat per serving. Shopping  Choose nonfat and low-fat healthy foods. Look for the words "nonfat," "low fat," or "fat free."  Avoid buying processed or prepackaged foods. Cooking  Cook using low-fat methods, such as baking, broiling, grilling, or boiling.  Cook with small amounts of healthy fats, such as olive oil, grapeseed oil, canola oil, or sunflower oil. What foods are recommended?   All fresh, frozen, or canned fruits and vegetables.  Whole grains.  Low-fat or non-fat (skim) milk and yogurt.  Lean meat, skinless poultry, fish, eggs, and beans.  Low-fat protein supplement powders or  drinks.  Spices and herbs. What foods are not recommended?  High-fat foods. These include baked goods, fast food, fatty cuts of meat, ice cream, french toast, sweet rolls, pizza, cheese bread, foods covered with butter, creamy sauces, or cheese.  Fried foods. These include french fries, tempura, battered fish, breaded chicken, fried breads, and sweets.  Foods with strong odors.  Foods that cause bloating and gas. Summary  A low-fat diet can be helpful if you have a gallbladder condition, or before and after gallbladder surgery.  Limit your fat intake to less than 30% of your total daily calories. This is about 60 g of fat if you eat 1,800 calories each day.  Eat small, frequent meals throughout the day instead of three large meals. This information is not intended to replace advice given to you by your health care provider. Make sure you discuss any questions you have with your health care provider. Document Revised: 04/16/2018 Document Reviewed: 02/01/2016 Elsevier Patient Education  2020 [place] Sodium Nutrition Therapy  Eating less sodium can help you if you have high blood pressure, heart failure, or kidney or liver disease.   Your body needs a little sodium, but too much sodium can cause your body to hold onto extra water. This extra water will raise your blood pressure and can cause damage to your heart, kidneys, or liver as they are forced to work harder.   Sometimes you can see how the extra fluid affects you because your  hands, legs, or belly swell. You may also hold water around your heart and lungs, which makes it hard to breathe.   Even if you take medication for blood pressure or a water pill (diuretic) to remove fluid, it is still important to have less salt in your diet.   Check with your primary care provider before drinking alcohol since it may affect the amount of fluid in your body and how your heart, kidneys, or liver work. Sodium in Food A low-sodium  meal plan limits the sodium that you get from food and beverages to 1,500-2,000 milligrams (mg) per day. Salt is the main source of sodium. Read the nutrition label on the package to find out how much sodium is in one serving of a food.  . Select foods with 140 milligrams (mg) of sodium or less per serving.  . You may be able to eat one or two servings of foods with a little more than 140 milligrams (mg) of sodium if you are closely watching how much sodium you eat in a day.  . Check the serving size on the label. The amount of sodium listed on the label shows the amount in one serving of the food. So, if you eat more than one serving, you will get more sodium than the amount listed.  Tips Cutting Back on Sodium . Eat more fresh foods.  . Fresh fruits and vegetables are low in sodium, as well as frozen vegetables and fruits that have no added juices or sauces.  . Fresh meats are lower in sodium than processed meats, such as bacon, sausage, and hotdogs.  . Not all processed foods are unhealthy, but some processed foods may have too much sodium.  . Eat less salt at the table and when cooking. One of the ingredients in salt is sodium.  . One teaspoon of table salt has 2,300 milligrams of sodium.  . Leave the salt out of recipes for pasta, casseroles, and soups. . Be a smart shopper.  . Food packages that say "Salt-free", sodium-free", "very low sodium," and "low sodium" have less than 140 milligrams of sodium per serving.  . Beware of products identified as "Unsalted," "No Salt Added," "Reduced Sodium," or "Lower Sodium." These items may still be high in sodium. You should always check the nutrition label. . Add flavors to your food without adding sodium.  . Try lemon juice, lime juice, or vinegar.  . Dry or fresh herbs add flavor.  Sharyn Lull a sodium-free seasoning blend or make your own at home. . You can purchase salt-free or sodium-free condiments like barbeque sauce in stores and online. Ask your  registered dietitian nutritionist for recommendations and where to find them.  .  Eating in Restaurants . Choose foods carefully when you eat outside your home. Restaurant foods can be very high in sodium. Many restaurants provide nutrition facts on their menus or their websites. If you cannot find that information, ask your server. Let your server know that you want your food to be cooked without salt and that you would like your salad dressing and sauces to be served on the side.  .   . Foods Recommended . Food Group . Foods Recommended  . Grains . Bread, bagels, rolls without salted tops Homemade bread made with reduced-sodium baking powder Cold cereals, especially shredded wheat and puffed rice Oats, grits, or cream of wheat Pastas, quinoa, and rice Popcorn, pretzels or crackers without salt Corn tortillas  . Protein Foods . Fresh  meats and fish; [place] bacon (check the nutrition labels - make sure they are not packaged in a sodium solution) Canned or packed tuna (no more than 4 ounces at 1 serving) Beans and peas Soybeans) and tofu Eggs Nuts or nut butters without salt  . Dairy . Milk or milk powder Plant milks, such as rice and soy Yogurt, including Greek yogurt Small amounts of natural cheese (blocks of cheese) or reduced-sodium cheese can be used in moderation. (Swiss, ricotta, and fresh mozzarella cheese are lower in sodium than the others) Cream Cheese Low sodium cottage cheese  . Vegetables . Fresh and frozen vegetables without added sauces or salt Homemade soups (without salt) Low-sodium, salt-free or sodium-free canned vegetables and soups  . Fruit . Fresh and canned fruits Dried fruits, such as raisins, cranberries, and prunes  . Oils . Tub or liquid margarine, regular or without salt Canola, corn, peanut, olive, safflower, or sunflower oils  . Condiments . Fresh or dried herbs such as basil, bay leaf, dill, mustard (dry), nutmeg, paprika, parsley, rosemary, sage, or  thyme.  Low sodium ketchup Vinegar  Lemon or lime juice Pepper, red pepper flakes, and cayenne. Hot sauce contains sodium, but if you use just a drop or two, it will not add up to much.  Salt-free or sodium-free seasoning mixes and marinades Simple salad dressings: vinegar and oil  .  Marland Kitchen Foods Not Recommended . Food Group . Foods Not Recommended  . Grains . Breads or crackers topped with salt Cereals (hot/cold) with more than 300 mg sodium per serving Biscuits, cornbread, and other "quick" breads prepared with baking soda Pre-packaged bread crumbs Seasoned and packaged rice and pasta mixes Self-rising flours  . Protein Foods . Cured meats: Bacon, ham, sausage, pepperoni and hot dogs Canned meats (chili, vienna sausage, or sardines) Smoked fish and meats Frozen meals that have more than 600 mg of sodium per serving Egg substitute (with added sodium)  . Dairy . Buttermilk Processed cheese spreads Cottage cheese (1 cup may have over 500 mg of sodium; look for low-sodium.) American or feta cheese Shredded Cheese has more sodium than blocks of cheese String cheese  . Vegetables . Canned vegetables (unless they are salt-free, sodium-free or low sodium) Frozen vegetables with seasoning and sauces Sauerkraut and pickled vegetables Canned or dried soups (unless they are salt-free, sodium-free, or low sodium) [place] fries and onion rings  . Fruit . Dried fruits preserved with additives that have sodium  . Oils . Salted butter or margarine, all types of olives  . Condiments . Salt, sea salt, kosher salt, onion salt, and garlic salt Seasoning mixes with salt Bouillon cubes Ketchup Barbeque sauce and Worcestershire sauce unless low sodium Soy sauce Salsa, pickles, olives, relish Salad dressings: ranch, blue cheese, [place], and [place].  .  . Low Sodium Sample 1-Day Menu  . Breakfast . 1 cup cooked oatmeal  . 1 slice whole wheat bread toast  . 1 tablespoon peanut butter without salt   . 1 banana  . 1 cup 1% milk  . Lunch . Tacos made with: 2 corn tortillas  .  cup black beans, low sodium  .  cup roasted or grilled chicken (without skin)  .  avocado  . Squeeze of lime juice  . 1 cup salad greens  . 1 tablespoon low-sodium salad dressing  .  cup strawberries  . 1 orange  . Afternoon Snack . 1/3 cup grapes  . 6 ounces yogurt  . Evening Meal . 3  ounces herb-baked fish  . 1 baked potato  . 2 teaspoons olive oil  .  cup cooked carrots  . 2 thick slices tomatoes on:  . 2 lettuce leaves  . 1 teaspoon olive oil  . 1 teaspoon balsamic vinegar  . 1 cup 1% milk  . Evening Snack . 1 apple  .  cup almonds without salt  .  Marland Kitchen Low-Sodium Vegetarian (Lacto-Ovo) Sample 1-Day Menu  . Breakfast . 1 cup cooked oatmeal  . 1 slice whole wheat toast  . 1 tablespoon peanut butter without salt  . 1 banana  . 1 cup 1% milk  . Lunch . Tacos made with: 2 corn tortillas  .  cup black beans, low sodium  .  cup roasted or grilled chicken (without skin)  .  avocado  . Squeeze of lime juice  . 1 cup salad greens  . 1 tablespoon low-sodium salad dressing  .  cup strawberries  . 1 orange  . Evening Meal . Stir fry made with:  cup tofu  . 1 cup brown rice  .  cup broccoli  .  cup green beans  .  cup peppers  .  tablespoon peanut oil  . 1 orange  . 1 cup 1% milk  . Evening Snack . 4 strips celery  . 2 tablespoons hummus  . 1 hard-boiled egg  .  Marland Kitchen Low-Sodium Vegan Sample 1-Day Menu  . Breakfast . 1 cup cooked oatmeal  . 1 tablespoon peanut butter without salt  . 1 cup blueberries  . 1 cup soymilk fortified with calcium, vitamin B12, and vitamin D  . Lunch . 1 small whole wheat pita  .  cup cooked lentils  . 2 tablespoons hummus  . 4 carrot sticks  . 1 medium apple  . 1 cup soymilk fortified with calcium, vitamin B12, and vitamin D  . Evening Meal . Stir fry made with:  cup tofu  . 1 cup brown rice  .  cup broccoli  .  cup green beans  .  cup  peppers  .  tablespoon peanut oil  . 1 cup cantaloupe  . Evening Snack . 1 cup soy yogurt  .  cup mixed nuts  . Copyright 2020  Academy of Nutrition and Dietetics. All rights reserved .  Marland Kitchen Sodium Free Flavoring Tips .  Marland Kitchen When cooking, the following items may be used for flavoring instead of salt or seasonings that contain sodium. . Remember: A little bit of spice goes a long way! Be careful not to overseason. Marland Kitchen Spice Blend Recipe (makes about ? cup) . 5 teaspoons onion powder  . 2 teaspoons garlic powder  . 2 teaspoons paprika  . 2 teaspoon dry mustard  . 1 teaspoon crushed thyme leaves  .  teaspoon white pepper  .  teaspoon celery seed Food Item Flavorings  Beef Basil, bay leaf, caraway, curry, dill, dry mustard, garlic, grape jelly, green pepper, mace, marjoram, mushrooms (fresh), nutmeg, onion or onion powder, parsley, pepper, rosemary, sage  Chicken Basil, cloves, cranberries, mace, mushrooms (fresh), nutmeg, oregano, paprika, parsley, pineapple, saffron, sage, savory, tarragon, thyme, tomato, turmeric  Egg Chervil, curry, dill, dry mustard, garlic or garlic powder, green pepper, jelly, mushrooms (fresh), nutmeg, onion powder, paprika, parsley, rosemary, tarragon, tomato  Fish Basil, bay leaf, chervil, curry, dill, dry mustard, green pepper, lemon juice, marjoram, mushrooms (fresh), paprika, pepper, tarragon, tomato, turmeric  Lamb Cloves, curry, dill, garlic or garlic powder, mace, mint, mint jelly, onion, oregano,  parsley, pineapple, rosemary, tarragon, thyme  Pork Applesauce, basil, caraway, chives, cloves, garlic or garlic powder, onion or onion powder, rosemary, thyme  Veal Apricots, basil, bay leaf, currant jelly, curry, ginger, marjoram, mushrooms (fresh), oregano, paprika  Vegetables Basil, dill, garlic or garlic powder, ginger, lemon juice, mace, marjoram, nutmeg, onion or onion powder, tarragon, tomato, sugar or sugar substitute, salt-free salad dressing, vinegar   Desserts Allspice, anise, cinnamon, cloves, ginger, mace, nutmeg, vanilla extract, other extracts   Copyright 2020  Academy of Nutrition and Dietetics. All rights reserved

## 2019-07-02 LAB — BASIC METABOLIC PANEL
Anion gap: 12 (ref 5–15)
BUN: 16 mg/dL (ref 8–23)
CO2: 29 mmol/L (ref 22–32)
Calcium: 9.2 mg/dL (ref 8.9–10.3)
Chloride: 96 mmol/L — ABNORMAL LOW (ref 98–111)
Creatinine, Ser: 1.04 mg/dL — ABNORMAL HIGH (ref 0.44–1.00)
GFR calc Af Amer: >60 mL/min (ref >60)
GFR calc non Af Amer: 57 mL/min — ABNORMAL LOW (ref >60)
Glucose, Bld: 95 mg/dL (ref 70–99)
Potassium: 3.8 mmol/L (ref 3.5–5.1)
Sodium: 137 mmol/L (ref 135–145)

## 2019-07-02 LAB — COOXEMETRY PANEL
Carboxyhemoglobin: 1.5 % (ref 0.5–1.5)
Carboxyhemoglobin: 1.7 % — ABNORMAL HIGH (ref 0.5–1.5)
Methemoglobin: 1.1 % (ref 0.0–1.5)
Methemoglobin: 0.9 % (ref 0.0–1.5)
O2 Saturation: 51.7 %
O2 Saturation: 67.2 %
Total hemoglobin: 13.2 g/dL (ref 12.0–16.0)
Total hemoglobin: 13.4 g/dL (ref 12.0–16.0)

## 2019-07-02 LAB — MAGNESIUM: Magnesium: 2 mg/dL (ref 1.7–2.4)

## 2019-07-02 MED ORDER — POTASSIUM CHLORIDE ER 20 MEQ PO TBCR: 20.0000 meq | EXTENDED_RELEASE_TABLET | Freq: Every day | ORAL | 0 refills | Status: DC

## 2019-07-02 MED ORDER — POTASSIUM CHLORIDE ER 20 MEQ PO TBCR: 10.0000 meq | EXTENDED_RELEASE_TABLET | Freq: Every day | ORAL | 0 refills | Status: DC

## 2019-07-02 MED ORDER — POTASSIUM CHLORIDE CRYS ER 20 MEQ PO TBCR
40.0000 meq | EXTENDED_RELEASE_TABLET | Freq: Once | ORAL | Status: AC
  Administered 2019-07-02: 40 meq via ORAL
  Filled 2019-07-02: qty 2

## 2019-07-02 MED ORDER — SPIRONOLACTONE 25 MG PO TABS: 25.0000 mg | ORAL_TABLET | Freq: Every day | ORAL | 0 refills | Status: DC

## 2019-07-02 MED ORDER — TORSEMIDE 20 MG PO TABS: 40.0000 mg | ORAL_TABLET | Freq: Every day | ORAL | 0 refills | Status: DC

## 2019-07-02 MED ORDER — DIGOXIN 125 MCG PO TABS: 0.1250 mg | ORAL_TABLET | Freq: Every day | ORAL | 0 refills | Status: DC

## 2019-07-02 MED ORDER — IVABRADINE HCL 5 MG PO TABS: 2.5000 mg | ORAL_TABLET | Freq: Two times a day (BID) | ORAL | 0 refills | Status: DC

## 2019-07-02 MED ORDER — LOSARTAN POTASSIUM 25 MG PO TABS: 12.5000 mg | ORAL_TABLET | Freq: Two times a day (BID) | ORAL | 0 refills | Status: DC

## 2019-07-02 MED ORDER — AMIODARONE HCL 200 MG PO TABS: ORAL_TABLET | ORAL | 0 refills | Status: DC

## 2019-07-02 MED ORDER — HEPARIN SOD (PORK) LOCK FLUSH 100 UNIT/ML IV SOLN
500.0000 [IU] | INTRAVENOUS | Status: AC | PRN
  Administered 2019-07-02: 500 [IU]
  Filled 2019-07-02: qty 5

## 2019-07-02 MED FILL — TORSEMIDE 20 MG TABLET: 20 | 30 days supply | Qty: 60 | Fill #0

## 2019-07-02 MED FILL — SPIRONOLACTONE 25 MG TABLET: 25 | 30 days supply | Qty: 30 | Fill #0

## 2019-07-02 MED FILL — DIGOXIN 0.125 MG TABLET: 125 | 30 days supply | Qty: 30 | Fill #0

## 2019-07-02 MED FILL — AMIODARONE HCL 200 MG TAB: 200 | 28 days supply | Qty: 60 | Fill #0

## 2019-07-02 MED FILL — LOSARTAN POTASSIUM 25 MG TA: 25 | 30 days supply | Qty: 30 | Fill #0

## 2019-07-02 MED FILL — POTASSIUM CHLORIDE 20meqER: 20 | 30 days supply | Qty: 30 | Fill #0

## 2019-07-02 NOTE — Discharge Summary (Addendum)
Physician Discharge Summary  Natalie Baker OAC:[id number] DOB: 1955-08-19 DOA: 06/21/2019  PCP: [name], MD  Admit date: 06/21/2019 Discharge date: 07/02/2019  Admitted From: Home Discharge disposition: Home   Code Status: Full Code  Diet Recommendation: Cardiac diet   Recommendations for Outpatient Follow-Up:   1. Follow-up with cardiology as an outpatient  Discharge Diagnosis:   Active Problems:   Hyperlipidemia   Hypothyroidism   Morbid obesity ([place]) BMI 35+ with prediabetes, hyperlipidemia, htn   Prediabetes   Mantle cell lymphoma (HCC)   Tachycardia   Acute systolic CHF (congestive heart failure) (HCC)   Acute cholecystitis  History of Present Illness / Brief narrative:  64 year old with past medical history significant for stage IV non-Hodgkin lymphoma, mantle cell lymphoma, hypertension, hypothyroidism. Patient presented to the ED on 6/14 with complaint of shortness of breath on exertion and evidence of sinus tachycardia.  She was found to be in acute exacerbation of systolic heart failure.  Cardiology was consulted.  She was also found to have V. tach and hence transferred to [facility] in 6/17 for right and left heart cath. Patient underwent right and left heart cath on 6/18 which showed normal coronary arteries, severe nonischemic cardiomyopathy ejection fraction 20%, output and cardiogenic shock. Patient was transferred to the ICU for inotropes.  Hospital Course:  Systolic Heart Failure Exacerbation Nonischemic cardiomyopathy Cardiogenic shock -Echo with EF 20 to 25%, moderately reduced RV function with severe MR/TR  -most likely related to cancer chemotherapy.   -Cardiac cath in 06/25/2019 did not show any coronary disease.   -milrinone drip was used.  -Renal function is stable. -Net diuresis of 1.7L last 24 hours, 16 L since admission. -Need weight loss of 3 pounds in 24 hours and 19 pounds since admission. - HF meds for d/c  - Amio 400 mg  twice a day x 7 days then 200 mg twice a day x7 days, then 200 mg daily - Digoxin 0.125 mg daily - Ivabradine 2.5 mg twice a day  - losartan 12.5 mg twice a day  - Spironolactone 25 mg daily - Torsemide 40 mg daily  - KDUR 20 meq daily -Off beta-blocker. Not on Entresto because of soft blood pressure.  Acute hypoxic respiratory failure: -Secondary to acute systolic heart failure exacerbation -Oxygen supplementation as needed.Currently unknown.  Nonsustained V. Tach: -Was kept on amiodarone drip while on milrinone. -Replacements of potassium and magnesium given to maintain potassium more than 4 and magnesium more than 2 -LifeVest arranged.  Severe TR/moderate to severe MR: -Per cardiology  Right abdominal pain Elevated liver enzymes -Acute cholecystitis vs biliary colic vs right heart failure related. -Surgery recommended conservative management with 5 days of IV antibiotics.   -Completed the course of antibiotics on 6/20. -Will require elective cholecystectomy as an outpatient. -Tolerating diet.  Follow-up with general surgery as an outpatient. -Elevated liver enzymes are likely secondary to right heart failure and are improving.  StageIV non-Hodgkin lymphoma, mantle cell lymphoma, -s/p R-CHOP with Neulasta -Follows with Dr. Earlie Server, conservative management for now.  Hypothyroidism: Continue with Synthroid  Prediabetes:Diet controlled  Hormone supplementation status post bilateral hysterectomy with bilateral oophorectomy On progesterone   Body mass index is 35.74 kg/m. Mobility: Encourage ambulation Code Status:  Code Status: Full Code   Subjective:  Seen and examined this morning.  Pleasant female.  Sitting up in chair.  Feels better.  Wants to go home.  Discharge Exam:   Vitals:   07/02/19 0109 07/02/19 0448 07/02/19 3235 07/02/19 5732  BP: (!) 114/55 102/89 (!) 93/54   Pulse: 94 88  88  Resp: 16 15 16    Temp: 98.1 F (36.7 C) 97.9 F (36.6  C) 98.2 F (36.8 C)   TempSrc: Oral Oral Oral   SpO2: 96% 98%    Weight:  87.5 kg    Height:        Body mass index is 35.26 kg/m.  General exam: Appears calm and comfortable.  Skin: No rashes, lesions or ulcers. HEENT: Atraumatic, normocephalic, supple neck, no obvious bleeding Lungs: Clear to auscultation bilaterally CVS: Regular rate and rhythm, no murmur GI/Abd soft, nontender, nondistended, bowel sound present CNS: Alert, awake, oriented x3 Psychiatry: Mood appropriate Extremities: No pedal edema, no calf tenderness  Discharge Instructions:  Wound care: None     Discharge Instructions    Diet - low sodium heart healthy   Complete by: As directed    Increase activity slowly   Complete by: As directed       Follow-up Information    [facility] [place] Follow up.   Specialty: General Surgery Why: as need for your gallbladder if cleared by cardiology for surgery Contact information: [address] [place] [address] [phone]        [facility] Follow up on 07/08/2019.   Specialty: Cardiology Why: 11:30  Contact information: [address] [id number] [place] [address] [phone]       [name], MD. Go on 07/09/2019.   Specialty: Internal Medicine Why: @10 :30am Contact information: [address] [place] [address] [phone]              Allergies as of 07/02/2019      Reactions   Ppd [tuberculin Purified Protein Derivative]    Positive reaction.  Negative Chest Xray 06/16/12      Medication List    STOP taking these medications   allopurinol 100 MG tablet Commonly known as: ZYLOPRIM   lidocaine-prilocaine cream Commonly known as: EMLA   ondansetron 8 MG tablet Commonly known as: ZOFRAN   predniSONE 50 MG tablet Commonly known as: DELTASONE   prochlorperazine 10 MG tablet Commonly known as: COMPAZINE    rosuvastatin 5 MG tablet Commonly known as: CRESTOR     TAKE these medications   amiodarone 200 MG tablet Commonly known as: PACERONE 400 mg twice a day x 7 days then 200 mg twice a day x7 days, then 200 mg daily   [place] Thyroid 90 MG tablet Generic drug: thyroid Take 90 mg by mouth every morning.   digoxin 0.125 MG tablet Commonly known as: LANOXIN Take 1 tablet (0.125 mg total) by mouth daily. Start taking on: July 03, 2019   estradiol 0.075 MG/24HR Commonly known as: VIVELLE-DOT Place 1 patch onto the skin 2 (two) times a week.   ivabradine 5 MG Tabs tablet Commonly known as: CORLANOR Take 0.5 tablets (2.5 mg total) by mouth 2 (two) times daily with a meal.   losartan 25 MG tablet Commonly known as: COZAAR Take 0.5 tablets (12.5 mg total) by mouth 2 (two) times daily.   Potassium Chloride ER 20 MEQ Tbcr Take 20 mEq by mouth daily.   progesterone 100 MG capsule Commonly known as: PROMETRIUM Take 100 mg by mouth daily.   spironolactone 25 MG tablet Commonly known as: ALDACTONE Take 1 tablet (25 mg total) by mouth daily. Start taking on: July 03, 2019   torsemide 20 MG tablet Commonly  known as: DEMADEX Take 2 tablets (40 mg total) by mouth daily. Start taking on: July 03, 2019       Time coordinating discharge: 35 minutes  The results of significant diagnostics from this hospitalization (including imaging, microbiology, ancillary and laboratory) are listed below for reference.    Procedures and Diagnostic Studies:   CT Angio Chest PE W and/or Wo Contrast  Result Date: 06/21/2019 CLINICAL DATA:  Shortness of breath. History of mantle cell lymphoma. EXAM: CT ANGIOGRAPHY CHEST WITH CONTRAST TECHNIQUE: Multidetector CT imaging of the chest was performed using the standard protocol during bolus administration of intravenous contrast. Multiplanar CT image reconstructions and MIPs were obtained to evaluate the vascular anatomy. CONTRAST:  150mL OMNIPAQUE IOHEXOL  350 MG/ML SOLN COMPARISON:  CT scan dated 03/29/2019 and PET-CT dated 06/10/2019 FINDINGS: Cardiovascular: There is a slightly increased small pericardial effusion primarily around the ascending thoracic aorta. Heart size is normal. No pulmonary emboli. Mediastinum/Nodes: The patient is a few small bilateral axillary lymph nodes, less prominent than on the prior CT scan of 03/29/2019. No hilar or mediastinal adenopathy. Thyroid gland, trachea, and esophagus appear normal. Lungs/Pleura: The patient has new small bilateral pleural effusions, right greater than left. Slight atelectasis at the lung bases with minimal interstitial edema at the lung bases. No consolidative infiltrates. Focal area of chronic atelectasis at the medial aspect of the right middle lobe is stable. Upper Abdomen: No acute abnormality. Musculoskeletal: No chest wall abnormality. No acute or significant osseous findings. Review of the MIP images confirms the above findings. IMPRESSION: 1. No pulmonary emboli. 2. New small bilateral pleural effusions, right greater than left. 3. Slight atelectasis at the lung bases with minimal interstitial edema at the lung bases. 4. Slight increase in the small pericardial effusion. Electronically Signed   By: Lorriane Shire M.D.   On: 06/21/2019 15:28   DG Abdomen Acute W/Chest  Result Date: 06/21/2019 CLINICAL DATA:  Diffuse abdominal pain, nausea and vomiting beginning 2 days ago. Mantle cell lymphoma. EXAM: DG ABDOMEN ACUTE W/ 1V CHEST COMPARISON:  03/29/2019 FINDINGS: Power port inserted from a right approach has its tip in the SVC just below the azygos level. Cardiac silhouette is enlarged. There are bilateral effusions in there is density in both lower lobes consistent with atelectasis. Pneumonia not excluded. No free air. Abdominal bowel gas pattern does not show ileus or obstruction. No abnormal calcifications or bone findings. Ordinary lower lumbar degenerative changes. IMPRESSION: Pleural effusions  and lower lung atelectasis. No acute or significant radiographic abdominal finding. Electronically Signed   By: Nelson Chimes M.D.   On: 06/21/2019 13:36   ECHOCARDIOGRAM COMPLETE  Result Date: 06/22/2019    ECHOCARDIOGRAM REPORT   Patient Name:   Natalie Baker Date of Exam: 06/22/2019 Medical Rec #:  [id number]        Height:       62.0 in Accession #:    [id number]       Weight:       203.9 lb Date of Birth:  04-May-1955       BSA:          1.928 m Patient Age:    16 years         BP:           107/91 mmHg Patient Gender: F                HR:           128 bpm. Exam Location:  Inpatient Procedure: 2D Echo, Cardiac Doppler, Color Doppler and Intracardiac            Opacification Agent Indications:    Elevated Troponin  History:        Patient has prior history of Echocardiogram examinations, most                 recent 01/22/2019. Risk Factors:Dyslipidemia. Cancer.                 Hypothyroidism.  Sonographer:    Jonelle Sidle Dance Referring Phys: [place]  1. There is no left ventricular thrombus. Left ventricular ejection fraction, by estimation, is 20 to 25%. The left ventricle has severely decreased function. The left ventricle demonstrates global hypokinesis. Indeterminate diastolic filling due to E-A  fusion.  2. Right ventricular systolic function is moderately reduced. The right ventricular size is mildly enlarged. There is normal pulmonary artery systolic pressure. The estimated right ventricular systolic pressure is 10.2 mmHg.  3. Left atrial size was mild to moderately dilated.  4. The pericardial effusion is circumferential. Moderate pleural effusion in the left lateral region.  5. The mitral valve is normal in structure. Moderate to severe mitral valve regurgitation. No evidence of mitral stenosis.  6. Tricuspid valve regurgitation is severe.  7. The aortic valve is tricuspid. Aortic valve regurgitation is not visualized. No aortic stenosis is present.  8. The inferior vena cava is  dilated in size with <50% respiratory variability, suggesting right atrial pressure of 15 mmHg. Comparison(s): Changes from prior study are noted. Compared to 01/22/2019, there is a marked reduction in left ventricular systolic function, moderate reduction in right ventricular systolic function, there is new moderate to severe mitral insufficiency (likely secondary/functional) and new severe tricuspid insufficiency. FINDINGS  Left Ventricle: There is no left ventricular thrombus. Left ventricular ejection fraction, by estimation, is 20 to 25%. The left ventricle has severely decreased function. The left ventricle demonstrates global hypokinesis. Definity contrast agent was given IV to delineate the left ventricular endocardial borders. The left ventricular internal cavity size was normal in size. There is no left ventricular hypertrophy. Indeterminate diastolic filling due to E-A fusion. Right Ventricle: The right ventricular size is mildly enlarged. No increase in right ventricular wall thickness. Right ventricular systolic function is moderately reduced. There is normal pulmonary artery systolic pressure. The tricuspid regurgitant velocity is 1.85 m/s, and with an assumed right atrial pressure of 15 mmHg, the estimated right ventricular systolic pressure is 72.5 mmHg. Left Atrium: Left atrial size was mild to moderately dilated. Right Atrium: Right atrial size was normal in size. Pericardium: Trivial pericardial effusion is present. The pericardial effusion is circumferential. Mitral Valve: The mitral valve is normal in structure. There is mild thickening of the mitral valve leaflet(s). Mild mitral annular calcification. Moderate to severe mitral valve regurgitation, with centrally-directed jet. No evidence of mitral valve stenosis. Tricuspid Valve: The tricuspid valve is normal in structure. Tricuspid valve regurgitation is severe. The flow in the hepatic veins is reversed during ventricular systole. Aortic  Valve: The aortic valve is tricuspid. Aortic valve regurgitation is not visualized. No aortic stenosis is present. Pulmonic Valve: The pulmonic valve was not well visualized. Pulmonic valve regurgitation is not visualized. Aorta: The aortic root and ascending aorta are structurally normal, with no evidence of dilitation. Venous: The inferior vena cava is dilated in size with less than 50% respiratory variability, suggesting right atrial pressure of 15 mmHg. IAS/Shunts: No atrial level shunt detected by color flow  Doppler. Additional Comments: There is a moderate pleural effusion in the left lateral region.  LEFT VENTRICLE PLAX 2D LVIDd:         5.10 cm LVIDs:         4.20 cm LV PW:         1.10 cm LV IVS:        1.00 cm LVOT diam:     1.80 cm LV SV:         18 LV SV Index:   9 LVOT Area:     2.54 cm  RIGHT VENTRICLE            IVC RV Basal diam:  2.90 cm    IVC diam: 1.80 cm RV S prime:     5.22 cm/s TAPSE (M-mode): 1.4 cm LEFT ATRIUM             Index       RIGHT ATRIUM           Index LA diam:        4.30 cm 2.23 cm/m  RA Area:     14.30 cm LA Vol (A2C):   77.2 ml 40.05 ml/m RA Volume:   30.90 ml  16.03 ml/m LA Vol (A4C):   31.0 ml 16.08 ml/m LA Biplane Vol: 48.8 ml 25.32 ml/m  AORTIC VALVE LVOT Vmax:   57.30 cm/s LVOT Vmean:  32.700 cm/s LVOT VTI:    0.072 m  AORTA Ao Root diam: 3.10 cm Ao Asc diam:  3.20 cm MITRAL VALVE                TRICUSPID VALVE MV Area (PHT): 5.07 cm     TR Peak grad:   13.6 mmHg MV Decel Time: 150 msec     TR Vmax:        184.60 cm/s MV E velocity: 122.00 cm/s                             SHUNTS                             Systemic VTI:  0.07 m                             Systemic Diam: 1.80 cm Dani Gobble Croitoru MD Electronically signed by Sanda Klein MD Signature Date/Time: 06/22/2019/10:45:23 AM    Final    US Abdomen Limited RUQ  Result Date: 06/22/2019 CLINICAL DATA:  Elevated liver function tests. EXAM: ULTRASOUND ABDOMEN LIMITED RIGHT UPPER QUADRANT COMPARISON:  CT chest  06/21/2019. CT chest, abdomen and pelvis 03/01/2019. FINDINGS: Gallbladder: Stones are seen in the gallbladder measuring up to 1.5 cm. There is pericholecystic fluid. The gallbladder wall is thickened at 0.4 cm. Common bile duct: Diameter: 0.3 cm Liver: No focal lesion identified. Within normal limits in parenchymal echogenicity. Portal vein is patent on color Doppler imaging with normal direction of blood flow towards the liver. Other: Small right pleural effusion is noted. IMPRESSION: Gallstones with pericholecystic fluid and wall thickening compatible with acute cholecystitis. Small appearing right pleural effusion. Electronically Signed   By: Inge Rise M.D.   On: 06/22/2019 15:23     Labs:   Basic Metabolic Panel: Recent Labs  Lab 06/26/19 6812 06/26/19 7517 06/27/19 0017 06/27/19 4944 06/28/19 9675 06/28/19 9163 06/29/19 0531 06/29/19 0531 06/30/19 0430 06/30/19  0430 07/01/19 0433 07/01/19 0737 07/02/19 0506  NA 138   < > 135   < > 137  --  136  --  138  --  138  --  137  K 3.1*   < > 3.3*   < > 4.2   < > 4.8   < > 3.6   < > 3.8  --  3.8  CL 97*   < > 95*   < > 98  --  98  --  95*  --  95*  --  96*  CO2 29   < > 30   < > 30  --  28  --  30  --  32  --  29  GLUCOSE 124*   < > 112*   < > 113*  --  110*  --  100*  --  96  --  95  BUN 11   < > 9   < > 12  --  9  --  11  --  14  --  16  CREATININE 1.08*   < > 1.07*   < > 1.10*  --  0.84  --  0.99  --  1.03*  --  1.04*  CALCIUM 8.6*   < > 8.5*   < > 8.7*  --  9.0  --  9.2  --  9.4  --  9.2  MG 1.7  --  1.7  --   --   --   --   --  1.8  --   --  2.1 2.0   < > = values in this interval not displayed.   GFR Estimated Creatinine Clearance: 56.9 mL/min (A) (by C-G formula based on SCr of 1.04 mg/dL (H)). Liver Function Tests: No results for input(s): AST, ALT, ALKPHOS, BILITOT, PROT, ALBUMIN in the last 168 hours. No results for input(s): LIPASE, AMYLASE in the last 168 hours. No results for input(s): AMMONIA in the last 168  hours. Coagulation profile No results for input(s): INR, PROTIME in the last 168 hours.  CBC: Recent Labs  Lab 06/27/19 0420 06/30/19 0430  WBC 5.9 7.5  HGB 11.0* 12.5  HCT 35.2* 39.6  MCV 90.0 89.6  PLT 143* 185   Cardiac Enzymes: No results for input(s): CKTOTAL, CKMB, CKMBINDEX, TROPONINI in the last 168 hours. BNP: Invalid input(s): POCBNP CBG: Recent Labs  Lab 06/26/19 0733  GLUCAP 117*   D-Dimer No results for input(s): DDIMER in the last 72 hours. Hgb A1c No results for input(s): HGBA1C in the last 72 hours. Lipid Profile No results for input(s): CHOL, HDL, LDLCALC, TRIG, CHOLHDL, LDLDIRECT in the last 72 hours. Thyroid function studies No results for input(s): TSH, T4TOTAL, T3FREE, THYROIDAB in the last 72 hours.  Invalid input(s): FREET3 Anemia work up No results for input(s): VITAMINB12, FOLATE, FERRITIN, TIBC, IRON, RETICCTPCT in the last 72 hours. Microbiology Recent Results (from the past 240 hour(s))  Surgical pcr screen     Status: None   Collection Time: 06/24/19  7:42 PM   Specimen: Nasal Mucosa; Nasal Swab  Result Value Ref Range Status   MRSA, PCR NEGATIVE NEGATIVE Final   Staphylococcus aureus NEGATIVE NEGATIVE Final    Comment: (NOTE) The Xpert SA Assay (FDA approved for NASAL specimens in patients 75 years of age and older), is one component of a comprehensive surveillance program. It is not intended to diagnose infection nor to guide or monitor treatment. Performed at [facility], [address] [place], [place] [address]     Please note: You were cared for by a hospitalist during your hospital stay. Once you are discharged, your primary care physician will handle any further medical issues. Please note that NO REFILLS for any discharge medications will be authorized once you are discharged, as it is imperative that you return to your primary care physician (or establish a relationship with a primary care physician if you do not  have one) for your post hospital discharge needs so that they can reassess your need for medications and monitor your lab values.  Signed: Terrilee Croak  [facility] 07/02/2019, 11:02 AM

## 2019-07-02 NOTE — Progress Notes (Addendum)
Advanced Heart Failure Rounding Note   Subjective:    Cath 6/18 with normal cors. Low output.   Milrinone stopped 6/23. CO-OX down trending 59.7, today 51.7%   Awake and more alert than yesterday. Able to walk in the room. Denies SOB, dizziness, chest pain. Denies abd pain, N/V, able to maintain good appetite .       Objective:   Weight Range:  Vital Signs:   Temp:  [97.4 F (36.3 C)-98.1 F (36.7 C)] 97.9 F (36.6 C) (06/25 0448) Pulse Rate:  [88-97] 88 (06/25 0448) Resp:  [15-18] 15 (06/25 0448) BP: (94-114)/(53-89) 102/89 (06/25 0448) SpO2:  [96 %-98 %] 98 % (06/25 0448) Weight:  [87.5 kg] 87.5 kg (06/25 0448) Last BM Date: 06/30/19  Weight change: Filed Weights   06/30/19 0055 07/01/19 0319 07/02/19 0448  Weight: 90.8 kg 88.6 kg 87.5 kg    Intake/Output:   Intake/Output Summary (Last 24 hours) at 07/02/2019 0734 Last data filed at 07/02/2019 0453 Gross per 24 hour  Intake 840 ml  Output 2600 ml  Net -1760 ml   PHYSICAL EXAM: General:  Well appearing. No resp difficulty HEENT: normal Neck: supple. no JVD. Carotids 2+ bilat; no bruits. No lymphadenopathy or thryomegaly appreciated. Cor: PMI nondisplaced. Regular rate & rhythm. No rubs, gallops or murmurs.  Lungs: clear Abdomen: soft, nontender, nondistended. No hepatosplenomegaly. No bruits or masses. Good bowel sounds. Extremities: no cyanosis, clubbing, rash, edema Neuro: alert & orientedx3, cranial nerves grossly intact. moves all 4 extremities w/o difficulty. Affect pleasant  Telemetry: NSR 80-90s with rare PVCs.  Labs: Basic Metabolic Panel: Recent Labs  Lab 06/26/19 0521 06/26/19 1505 06/27/19 0420 06/27/19 0420 06/28/19 0520 06/28/19 0520 06/29/19 0531 06/29/19 0531 06/30/19 0430 07/01/19 0433 07/01/19 0737 07/02/19 0506  NA 138   < > 135   < > 137  --  136  --  138 138  --  137  K 3.1*   < > 3.3*   < > 4.2  --  4.8  --  3.6 3.8  --  3.8  CL 97*   < > 95*   < > 98  --  98  --  95*  95*  --  96*  CO2 29   < > 30   < > 30  --  28  --  30 32  --  29  GLUCOSE 124*   < > 112*   < > 113*  --  110*  --  100* 96  --  95  BUN 11   < > 9   < > 12  --  9  --  11 14  --  16  CREATININE 1.08*   < > 1.07*   < > 1.10*  --  0.84  --  0.99 1.03*  --  1.04*  CALCIUM 8.6*   < > 8.5*   < > 8.7*   < > 9.0   < > 9.2 9.4  --  9.2  MG 1.7  --  1.7  --   --   --   --   --  1.8  --  2.1 2.0   < > = values in this interval not displayed.    Liver Function Tests: No results for input(s): AST, ALT, ALKPHOS, BILITOT, PROT, ALBUMIN in the last 168 hours. No results for input(s): LIPASE, AMYLASE in the last 168 hours. No results for input(s): AMMONIA in the last 168 hours.  CBC: Recent Labs  Lab 06/25/19 1039 06/25/19 1045 06/27/19 0420 06/30/19 0430  WBC  --   --  5.9 7.5  HGB 12.9  12.9 11.9* 11.0* 12.5  HCT 38.0  38.0 35.0* 35.2* 39.6  MCV  --   --  90.0 89.6  PLT  --   --  143* 185    Cardiac Enzymes: No results for input(s): CKTOTAL, CKMB, CKMBINDEX, TROPONINI in the last 168 hours.  BNP: BNP (last 3 results) Recent Labs    06/23/19 0332  BNP 1,170.4*    ProBNP (last 3 results) No results for input(s): PROBNP in the last 8760 hours.    Other results:  Imaging: No results found.   Medications:     Scheduled Medications:  amiodarone  400 mg Oral BID   Chlorhexidine Gluconate Cloth  6 each Topical Daily   digoxin  0.125 mg Oral Daily   enoxaparin (LOVENOX) injection  40 mg Subcutaneous Q24H   ivabradine  2.5 mg Oral BID WC   losartan  12.5 mg Oral BID   magnesium oxide  400 mg Oral BID   pantoprazole  40 mg Oral Daily   progesterone  100 mg Oral Daily   sodium chloride flush  3 mL Intravenous Q12H   sodium chloride flush  3 mL Intravenous Q12H   spironolactone  25 mg Oral Daily   thyroid  90 mg Oral QAC breakfast   torsemide  40 mg Oral Daily    Infusions:  sodium chloride      PRN Medications: sodium chloride, acetaminophen, alum & mag  hydroxide-simeth, morphine injection, ondansetron (ZOFRAN) IV, ondansetron **OR** [DISCONTINUED] ondansetron (ZOFRAN) IV, polyethylene glycol, sodium chloride flush, sodium chloride flush   Assessment/Progress:   1. Acute systolic HF with severe biventricular HF cardiogenic shock - Echo EF 20-25%  moderately reduced RV function with severe MR/TR - most likely from R-CHOP - Cath 06/25/19 no CAD. Low output. Milrinone started 06/25/19 and stopped 06/30/19 -CO-OX 51.7% . Repeat CO-OX pending.   -Volume status stable.  Continue torsemide 40 mg daily.  - Keep  off b-blocker - Continue digoxin. Dig level ok  - Continue spiro 25 daily - Contnue losartan 12.5 bid. BP too soft to titrate or switch to [employer]  - Supp K  -Renal function stable.    2. VT - Improved. Keep K> 4.0 Mg > 2.0, t - Supp K  - Continue amio 400  mg twice a day x 7 days then cut back to amio 200 mg twice a day then 200 mg daily. .   - life vest delivered. Plan to continue until ECHO repeated in  3 months.    3. Rt abd pain with acute cholecystitis.   - suspect this is due to RV failure - GSU following - Completed abx - should improve with Rx of HF -denies abd pain today, good appetite, no N/V   4. Stage IV non-Hodgkin's lymphoma, mantle cell lymphoma   - s/p R-CHOP w/ Neulasta - per Dr Earlie Server, conservative mgt for now   5. AKI - likely due to low output.  - Stable today.     6. Transaminitis - suspect shock liver/RV failure  Should be able to go home today  CO-OX stable 67%   - HF meds for d/c  - Amio 400 mg twice a day x 7 days then 200 mg twice a day x7 days, then 200 mg daily - Digoxin 0.125 mg daily - Ivabradine 2.5 mg twice a day  - losartan 12.5  mg twice a day  - Spironolactone 25 mg daily - Torsemide 40 mg daily  - KDUR 20 meq daily  HF follow up set up .    Length of Stay: [place] NP student  Darrick Grinder NP-C  9:04 AM  07/02/2019, 7:34 AM  Advanced Heart Failure  Team Pager [phone] (M-F; 7a - 4p)  Please contact [facility] for night-coverage after hours (4p -7a ) and weekends on [email or web address]  Patient seen with NP, agree with the above note.   Repeat co-ox was 67%.  She will be discharged today on the above meds with followup scheduled.   Natalie Baker 07/02/2019

## 2019-07-02 NOTE — TOC Benefit Eligibility Note (Signed)
Transition of Care [facility]) Benefit Eligibility Note    Patient Details  Name: Natalie Baker MRN: [id number] Date of Birth: 10-10-1955   Medication/Dose: CORLANOR  2.54 MG BID  Covered?: Yes  Tier: 3 Drug  Prescription Coverage [facility]erred Pharmacy: [facility]  Spoke with Person/Company/Phone Number:: LYNN @ [employer] #  [phone]  Co-Pay: $45.00  Prior Approval: Yes [phone])          Memory Argue Phone Number: 07/02/2019, 12:09 PM

## 2019-07-02 NOTE — TOC Transition Note (Addendum)
Transition of Care [facility]) - CM/SW Discharge Note   Patient Details  Name: DANICE DIPPOLITO MRN: [id number] Date of Birth: Dec 28, 1955  Transition of Care [facility]) CM/SW Contact:  Zenon Mayo, RN Phone Number: 07/02/2019, 12:28 PM   Clinical Narrative:    NCM offered choice to patient for HHRN,HHPT, she does not have a preference, NCM made referral to [facility] with [facility].  Awaiting  Call back to see if he can take referral.  Per Tommi Rumps ,he can not take referral due to staffing.  [facility] could not take, NCM contacted [facility] with [facility]. [place] call back.   Final next level of care: [place] Barriers to Discharge: No Barriers Identified   Patient Goals and CMS Choice Patient states their goals for this hospitalization and ongoing recovery are:: get better CMS Medicare.gov Compare Post Acute Care list provided to:: Patient Choice offered to / list presented to : Patient  Discharge Placement                       Discharge Plan and Services   Discharge Planning Services: CM Consult              DME Agency: NA       HH Arranged: RN, PT [facility] Agency: [place] Date [facility] Agency Contacted: 07/02/19 Time [place]: 1228 Representative spoke with at [place]: [place] ([place]) Interventions     Readmission Risk Interventions No flowsheet data found.

## 2019-07-02 NOTE — TOC Transition Note (Signed)
Transition of Care [facility]) - CM/SW Discharge Note   Patient Details  Name: Natalie Baker MRN: [id number] Date of Birth: 1955/11/12  Transition of Care [facility]) CM/SW Contact:  Zenon Mayo, RN Phone Number: 07/02/2019, 4:01 PM   Clinical Narrative:    [place] Moos today, NCM made referral to [facility] with [facility], she is able to take referral.  Soc will begin next week.    Final next level of care: [place] Barriers to Discharge: No Barriers Identified   Patient Goals and CMS Choice Patient states their goals for this hospitalization and ongoing recovery are:: get better CMS Medicare.gov Compare Post Acute Care list provided to:: Patient Choice offered to / list presented to : Patient  Discharge Placement                       Discharge Plan and Services   Discharge Planning Services: CM Consult              DME Agency: NA       HH Arranged: RN, PT HH Agency:  ([place]) Date [place]: 07/02/19 Time [place]: 26 Representative spoke with at [place]: [place] ([place]) Interventions     Readmission Risk Interventions No flowsheet data found.

## 2019-07-07 ENCOUNTER — Ambulatory Visit: Payer: BC Managed Care – PPO | Admitting: Physician Assistant

## 2019-07-08 ENCOUNTER — Telehealth: Payer: BC Managed Care – PPO | Admitting: Internal Medicine

## 2019-07-08 ENCOUNTER — Other Ambulatory Visit: Payer: Self-pay

## 2019-07-08 ENCOUNTER — Ambulatory Visit (HOSPITAL_COMMUNITY)
Admit: 2019-07-08 | Discharge: 2019-07-08 | Disposition: A | Discharge status: Home / Self Care | Payer: BC Managed Care – PPO | Source: Ambulatory Visit | Attending: Adult Health | Admitting: Adult Health

## 2019-07-08 ENCOUNTER — Encounter (HOSPITAL_COMMUNITY): Payer: Self-pay

## 2019-07-08 VITALS — BP 112/62 | HR 81 | Ht 62.0 in | Wt 191.0 lb

## 2019-07-08 DIAGNOSIS — I110 Hypertensive heart disease with heart failure: Secondary | ICD-10-CM | POA: Diagnosis not present

## 2019-07-08 DIAGNOSIS — Z8249 Family history of ischemic heart disease and other diseases of the circulatory system: Secondary | ICD-10-CM | POA: Insufficient documentation

## 2019-07-08 DIAGNOSIS — E785 Hyperlipidemia, unspecified: Secondary | ICD-10-CM | POA: Diagnosis not present

## 2019-07-08 DIAGNOSIS — I428 Other cardiomyopathies: Secondary | ICD-10-CM | POA: Diagnosis not present

## 2019-07-08 DIAGNOSIS — R110 Nausea: Secondary | ICD-10-CM | POA: Insufficient documentation

## 2019-07-08 DIAGNOSIS — K810 Acute cholecystitis: Secondary | ICD-10-CM | POA: Diagnosis not present

## 2019-07-08 DIAGNOSIS — I5082 Biventricular heart failure: Secondary | ICD-10-CM | POA: Diagnosis not present

## 2019-07-08 DIAGNOSIS — I5022 Chronic systolic (congestive) heart failure: Secondary | ICD-10-CM | POA: Insufficient documentation

## 2019-07-08 DIAGNOSIS — I472 Ventricular tachycardia, unspecified: Secondary | ICD-10-CM

## 2019-07-08 DIAGNOSIS — Z8349 Family history of other endocrine, nutritional and metabolic diseases: Secondary | ICD-10-CM | POA: Insufficient documentation

## 2019-07-08 DIAGNOSIS — Z79899 Other long term (current) drug therapy: Secondary | ICD-10-CM | POA: Diagnosis not present

## 2019-07-08 DIAGNOSIS — R7401 Elevation of levels of liver transaminase levels: Secondary | ICD-10-CM | POA: Insufficient documentation

## 2019-07-08 DIAGNOSIS — Z803 Family history of malignant neoplasm of breast: Secondary | ICD-10-CM | POA: Insufficient documentation

## 2019-07-08 DIAGNOSIS — Z87891 Personal history of nicotine dependence: Secondary | ICD-10-CM | POA: Diagnosis not present

## 2019-07-08 DIAGNOSIS — C8310 Mantle cell lymphoma, unspecified site: Secondary | ICD-10-CM | POA: Diagnosis not present

## 2019-07-08 DIAGNOSIS — R570 Cardiogenic shock: Secondary | ICD-10-CM | POA: Insufficient documentation

## 2019-07-08 DIAGNOSIS — Z7722 Contact with and (suspected) exposure to environmental tobacco smoke (acute) (chronic): Secondary | ICD-10-CM | POA: Diagnosis not present

## 2019-07-08 DIAGNOSIS — I5021 Acute systolic (congestive) heart failure: Secondary | ICD-10-CM | POA: Diagnosis not present

## 2019-07-08 DIAGNOSIS — E039 Hypothyroidism, unspecified: Secondary | ICD-10-CM | POA: Diagnosis not present

## 2019-07-08 DIAGNOSIS — N179 Acute kidney failure, unspecified: Secondary | ICD-10-CM

## 2019-07-08 DIAGNOSIS — Z79890 Hormone replacement therapy (postmenopausal): Secondary | ICD-10-CM | POA: Insufficient documentation

## 2019-07-08 DIAGNOSIS — E669 Obesity, unspecified: Secondary | ICD-10-CM | POA: Diagnosis not present

## 2019-07-08 LAB — BASIC METABOLIC PANEL
Anion gap: 12 (ref 5–15)
BUN: 25 mg/dL — ABNORMAL HIGH (ref 8–23)
CO2: 26 mmol/L (ref 22–32)
Calcium: 9.8 mg/dL (ref 8.9–10.3)
Chloride: 96 mmol/L — ABNORMAL LOW (ref 98–111)
Creatinine, Ser: 1.26 mg/dL — ABNORMAL HIGH (ref 0.44–1.00)
GFR calc Af Amer: 53 mL/min — ABNORMAL LOW (ref >60)
GFR calc non Af Amer: 45 mL/min — ABNORMAL LOW (ref >60)
Glucose, Bld: 87 mg/dL (ref 70–99)
Potassium: 4.1 mmol/L (ref 3.5–5.1)
Sodium: 134 mmol/L — ABNORMAL LOW (ref 135–145)

## 2019-07-08 LAB — MAGNESIUM: Magnesium: 2 mg/dL (ref 1.7–2.4)

## 2019-07-08 MED ORDER — IVABRADINE HCL 5 MG PO TABS: 2.5000 mg | ORAL_TABLET | Freq: Two times a day (BID) | ORAL | 0 refills | Status: DC

## 2019-07-08 MED ORDER — AMIODARONE HCL 200 MG PO TABS: 200.0000 mg | ORAL_TABLET | Freq: Every day | ORAL | 3 refills | Status: DC

## 2019-07-08 MED ORDER — SPIRONOLACTONE 25 MG PO TABS: 25.0000 mg | ORAL_TABLET | Freq: Every day | ORAL | 0 refills | Status: DC

## 2019-07-08 MED ORDER — DIGOXIN 125 MCG PO TABS: 0.1250 mg | ORAL_TABLET | Freq: Every day | ORAL | 0 refills | Status: DC

## 2019-07-08 MED ORDER — TORSEMIDE 20 MG PO TABS: 20.0000 mg | ORAL_TABLET | Freq: Every day | ORAL | 3 refills | Status: DC

## 2019-07-08 MED ORDER — POTASSIUM CHLORIDE ER 20 MEQ PO TBCR: 20.0000 meq | EXTENDED_RELEASE_TABLET | Freq: Every day | ORAL | 0 refills | Status: DC

## 2019-07-08 MED ORDER — LOSARTAN POTASSIUM 25 MG PO TABS: 12.5000 mg | ORAL_TABLET | Freq: Two times a day (BID) | ORAL | 0 refills | Status: DC

## 2019-07-08 NOTE — Progress Notes (Deleted)
Hospital follow up  Assessment and Plan: Hospital visit follow up for ***:   All medications were reviewed with patient and family and fully reconciled. All questions answered fully, and patient and family members were encouraged to call the office with any further questions or concerns. Discussed goal to avoid readmission related to this diagnosis.  There are no discontinued medications.  CAN NOT DO FOR BCBS REGULAR OR MEDICARE Over 40 minutes of exam, counseling, chart review, and complex, high/moderate level critical decision making was performed this visit.   Future Appointments  Date Time Provider [place]  07/09/2019 10:30 AM Liane Comber, NP [facility] None  07/26/2019 12:45 PM [facility] [place] FLUSH [facility] None  07/28/2019 11:00 AM [facility] PA/NP [facility] None  08/18/2019  3:00 PM Liane Comber, NP [facility] None  09/06/2019 12:45 PM [facility] [place] FLUSH [facility] None  10/18/2019 12:45 PM [facility] [place] FLUSH [facility] None  11/29/2019 12:45 PM [facility] [place] FLUSH [facility] None  12/10/2019  9:00 AM [facility] LAB 6 [facility] None  12/10/2019  9:15 AM [facility] [place] FLUSH [facility] None  12/13/2019  1:30 PM Curt Bears, MD [facility] None     HPI 64 y.o.female presents for follow up for transition from recent hospitalization or SNIF stay. Admit date to the hospital was 06/21/19, patient was discharged from the hospital on 07/02/19 and our clinical staff contacted the office the day after discharge to set up a follow up appointment. The discharge summary, medications, and diagnostic test results were reviewed before meeting with the patient. The patient was admitted for:     Tachycardia   Acute systolic CHF (congestive heart failure) ([place])   Acute cholecystitis  Active Problems:   Hyperlipidemia   Hypothyroidism   Morbid obesity ([place]) BMI 35+ with prediabetes, hyperlipidemia, htn   Prediabetes   Mantle cell lymphoma ([place])  Admit date: 06/21/2019 Discharge date:  07/02/2019  Admitted From: Home Discharge disposition: Home  64 year old with past medical history significant for stage IV non-Hodgkin lymphoma, mantle cell lymphoma, hypertension, hypothyroidism. Patient presented to the ED on 6/14 with complaint of shortness of breath on exertion and evidence of sinus tachycardia.  She was found to be in acute exacerbation of systolic heart failure. Cardiology was consulted. She was also found to have V. tach and hence transferred to [facility] in 6/17 for right and left heart cath which she underwent on 6/18 which showed normal coronary arteries, severe nonischemic cardiomyopathy ejection fraction 20%, output and cardiogenic shock. Patient was transferred to the ICU for inotropes.    Hospital Course per Dr. Marlowe Aschoff [place] discharge note:  Systolic Heart Failure Exacerbation Nonischemic cardiomyopathy Cardiogenic shock -Echo with EF 20 to 25%, moderately reduced RV function with severe MR/TR  -most likely related to cancer chemotherapy.  -Cardiac cath in 06/25/2019 did not show any coronary disease.  -milrinone dripwas used.  -Renal function is stable. -Net diuresis of1.7Llast 24 hours, 16 L since admission. -Need weight loss of 3 pounds in 24 hours and 19 pounds since admission. - HF meds for d/c  - Amio 400 mg twice a day x 7 days then 200 mg twice a day x7 days, then 200 mg daily - Digoxin 0.125 mg daily - Ivabradine 2.5 mg twice a day  - losartan 12.5 mg twice a day  - Spironolactone 25 mg daily - Torsemide 40 mg daily  - KDUR 20 meq daily -Off beta-blocker. Not on Entresto because of soft blood pressure.  Acute hypoxic respiratory failure: -Secondary to acute systolic heart failure exacerbation -  Oxygen supplementation as needed.Currently unknown.  Nonsustained V. Tach: -Was kept on amiodarone drip while on milrinone. -Replacementsof potassium and magnesium givento maintain potassium more than 4 and magnesium more than  2 -LifeVest arranged.  Severe TR/moderate to severe MR: -Per cardiology  Right abdominal pain Elevated liver enzymes -Acute cholecystitis vs biliary colic vs right heart failure related. -Surgery recommendedconservative management with 5 days of IV antibiotics. -Completed the course of antibiotics on 6/20. -Will require elective cholecystectomy as an outpatient. -Tolerating diet.Follow-up with general surgery as an outpatient. -Elevated liver enzymes are likely secondary to right heart failure and are improving.  StageIV non-Hodgkin lymphoma, mantle cell lymphoma, -s/p R-CHOP with Neulasta -Followswith Dr. Earlie Server, conservative management for now.  Hypothyroidism: Continue with Synthroid  Lab Results  Component Value Date   TSH 5.001 (H) 06/21/2019    07/09/2019 office follow up:   There were no vitals taken for this visit.  Patient with hx of hypothyroid on levothyroxine, new stage IV non-Hodgekin's lymphoma presents for hospital admission with course as per above, acute systolic CHF felt most likely from R-CHOP with neulasta (now following Dr. Earlie Server with conservative management for now); normal cath with ECHO EF 20-25%, mod reduced RV functions with severe MR/TR. Was on milrinone 6/18-6/23.  Follows up with heart failure clinic yesterday with Darrick Grinder, NP, who advised to continue torsemide 40 mg daily, continue off of BB due to low output, continue dig (normal levels), continue spironolactone 25 mg, losartan 12.5 mg BID, BPs felt to be too soft for transition to entresto. BMP and magensium were ordered and pending ***  DECREASE amiodarone to 200mg  daily  DECREASE torsemide to 20mg  daily    On life vest with plan to continue until repeat ECHO in 3 months  Keep K> 4.0 Mg > 2.0  Some question of acute cholecystitis vs biliary colic vs r/t acute CHF Notably did have CT abd 03/29/19 which showed no gallstones, gallbladder wall thickening, or biliary  dilatation. However with elevated LFTs underwent abd [place] on 06/22/2019 which showed Gallstones with pericholecystic fluid and wall thickening compatible with acute cholecystitis. Underwent 5 days of IV abx, GSU following ***  Cardiology feels ***  Denies {BlankSingle:19196::"dyspnea on exertion","orthopnea","paroxysmal nocturnal dyspnea","edema"}. Positive for {CARDIAC SYMPTOMS:12860}. Wt Readings from Last 3 Encounters:  07/08/19 191 lb (86.6 kg)  07/02/19 192 lb 12.8 oz (87.5 kg)  06/14/19 203 lb 4.8 oz (92.2 kg)   Cardiology follow up ***  - Amio 400 mg twice a day x 7 days then 200 mg twice a day x7 days, then 200 mg daily - Digoxin 0.125 mg daily - Ivabradine 2.5 mg twice a day  - losartan 12.5 mg twice a day  - Spironolactone 25 mg daily - Torsemide 40 mg daily  - KDUR 20 meq daily -Off beta-blocker. Not on Entresto because of soft blood pressure.  Oxygen  Life vest  Abdominal pain ***    1. 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 Low output. Milrinone started6/18/21 and stopped 06/30/19  -Volume status stable. Continue torsemide 40 mg daily.  - Keep off b-blocker - Continue digoxin. Dig level ok  - Continue spiro 25 daily - Contnue losartan 12.5 bid. BP too soft to titrate or switch to Entresto  2. VT - Improved. Keep K> 4.0 Mg > 2.0, t - Supp K - Cut back amio to 200 mg once  a day. .  -life vest delivered. Plan to continue until ECHO repeated in 3 months.   3.Rt abd pain with acute cholecystitis. -suspect this is due to RV failure - GSU following - Completed abx - should improve with Rx of HF -denies abd pain today, good appetite, no N/V  4.Stage IV non-Hodgkin's lymphoma, mantle cell lymphoma - s/p R-CHOP w/ Neulasta - per Dr Earlie Server, conservative mgt for now  5. AKI - likely due to low  output.  - Stable today.   6. Transaminitis - suspect shock liver/RV failure   Home health {ACTION; IS/IS XID:[id number]} involved.   Images while in the hospital: No results found.   Current Outpatient Medications (Endocrine & Metabolic):  Marland Kitchen  ARMOUR THYROID 90 MG tablet, Take 90 mg by mouth every morning. Marland Kitchen  estradiol (VIVELLE-DOT) 0.075 MG/24HR, Place 1 patch onto the skin 2 (two) times a week.  .  progesterone (PROMETRIUM) 100 MG capsule, Take 100 mg by mouth daily.   Current Outpatient Medications (Cardiovascular):  .  amiodarone (PACERONE) 200 MG tablet, Take 1 tablet (200 mg total) by mouth daily. .  digoxin (LANOXIN) 0.125 MG tablet, Take 1 tablet (0.125 mg total) by mouth daily. .  ivabradine (CORLANOR) 5 MG TABS tablet, Take 0.5 tablets (2.5 mg total) by mouth 2 (two) times daily with a meal. .  losartan (COZAAR) 25 MG tablet, Take 0.5 tablets (12.5 mg total) by mouth 2 (two) times daily. Marland Kitchen  spironolactone (ALDACTONE) 25 MG tablet, Take 1 tablet (25 mg total) by mouth daily. Marland Kitchen  torsemide (DEMADEX) 20 MG tablet, Take 1 tablet (20 mg total) by mouth daily.     Current Outpatient Medications (Other):  Marland Kitchen  Potassium Chloride ER 20 MEQ TBCR, Take 20 mEq by mouth daily.  Past Medical History:  Diagnosis Date  . Allergy   . Cancer [facility] 2)    being worked up for [place]  . Colon polyp 12/29/2006  . 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 Hyperlipidemia   . Hypothyroidism   . Low kidney function   . Obesity    BMI 34  . Osteoporosis   . Palpitations   . Vitamin D deficiency      Allergies  Allergen Reactions  . Ppd [Tuberculin Purified Protein Derivative]     Positive reaction.  Negative Chest Xray 06/16/12    ROS: all negative except above.   Physical Exam: There were no vitals filed for this visit. There were no vitals taken for this visit. General Appearance: Well nourished, in no apparent distress. Eyes: PERRLA, EOMs, conjunctiva no swelling or erythema Sinuses: No  Frontal/maxillary tenderness ENT/Mouth: Ext aud canals clear, TMs without erythema, bulging. No erythema, swelling, or exudate on post pharynx.  Tonsils not swollen or erythematous. Hearing normal.  Neck: Supple, thyroid normal.  Respiratory: Respiratory effort normal, BS equal bilaterally without rales, rhonchi, wheezing or stridor.  Cardio: RRR with no MRGs. Brisk peripheral pulses without edema.  Abdomen: Soft, + BS.  Non tender, no guarding, rebound, hernias, masses. Lymphatics: Non tender without lymphadenopathy.  Musculoskeletal: Full ROM, 5/5 strength, normal gait.  Skin: Warm, dry without rashes, lesions, ecchymosis.  Neuro: Cranial nerves intact. Normal muscle tone, no cerebellar symptoms. Sensation intact.  Psych: Awake and oriented X 3, normal affect, Insight and Judgment appropriate.     Izora Ribas, NP 1:00 PM [facility]

## 2019-07-08 NOTE — Progress Notes (Signed)
PCP: Dr Melford Aase Primary Cardiologist: Dr Haroldine Laws  Oncology: Dr Julien Nordmann  HPI: Natalie Baker a 64 y.o.femalewith a hx of palpitations thought to be PVCs, HTN, non hodgkins lymphoma last treatment (R CHOP).  Admitted with increased shortness of breath and abdominal discomfort. ECHO showed severely reduced EF 20-25%.  Had VT.Discharged with [place] on milrinone but gradually weaned off. Placed spiro, losartan, and dig. Not on entresto due to soft BP. No BB with low output.   Today she returns for Post HF follow up. Complaining of nausea after she takes medications. Says nausea last most of the day. SOB with exertion if she is carrying items. Denies PND/Orthopnea. Appetite ok. No fever or chills. Weight at home 192-->189 pounds. Taking all medications. Wearing Zoll. Had a couple alarms on zoll when she was bending over.   Life Vest ZOll: Alarm 0600 no arrhythmia noted. I personally reviewed.   ECHO 06/2019 EF 20-25%  moderately reduced RV function with severe MR/TR  Cath 6/18 with normal cors. Low output. Ao = 109/71 (81) LV =  99/23 RA =  15 RV =  43/18 PA =  35/14 (25) PCW = 22 Fick cardiac output/index = 3.3/1.7 PVR = < 1.0 WU Ao sat = 100% PA sat = 49%, 53% Assessment: 1. Normal coronary arteries 2. Severe NICM EF 20% 3. Elevated filling pressures and low CO c/w cardiogenic shock 4. Brief run of VT   ROS: All systems negative except as listed in HPI, PMH and Problem List.  SH:  Social History   Socioeconomic History  . Marital status: Married    Spouse name: Not on file  . Number of children: 1  . Years of education: Not on file  . Highest education level: Not on file  Occupational History  . Occupation: Theatre manager  Tobacco Use  . Smoking status: Former Smoker    Years: 15.00    Types: Cigarettes    Quit date: 01/08/1996    Years since quitting: 23.5  . Smokeless tobacco: Never Used  . Tobacco comment: quit 1998 but is exposed to 2nd  hand smoke  Vaping Use  . Vaping Use: Never used  Substance and Sexual Activity  . Alcohol use: No  . Drug use: No  . Sexual activity: Yes    Partners: Male    Birth control/protection: Post-menopausal  Other Topics Concern  . 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 Difficulty of Paying Living Expenses:   Food Insecurity:   . Worried About Charity fundraiser in the Last Year:   . Arboriculturist in the Last Year:   Transportation Needs: No Transportation Needs  . Lack of Transportation (Medical): No  . Lack of Transportation (Non-Medical): No  Physical Activity: Inactive  . Days of Exercise per Week: 0 days  . Minutes of Exercise per Session: 0 min  Stress: No Stress Concern Present  . Feeling of Stress : Only a little  Social Connections: Moderately Integrated  . Frequency of Communication with Friends and Family: More than three times a week  . Frequency of Social Gatherings with Friends and Family: Once a week  . Attends Religious Services: More than 4 times per year  . Active Member of Clubs or Organizations: No  . Attends Archivist Meetings: Never  . Marital  Status: Married  Human resources officer Violence:   . Fear of Current or Ex-Partner:   . Emotionally Abused:   Marland Kitchen Physically Abused:   . Sexually Abused:     FH:  Family History  Problem Relation Age of Onset  . Heart disease Mother 63       chf  . Diabetes Mother   . Thyroid disease Mother   . Cancer Mother 75       female/ skin  . Hypertension Mother   . Liver disease Mother   . Hypertension Father   . Alcohol abuse Father   . Heart disease Father   . Breast cancer Maternal Aunt   . Hyperlipidemia Maternal Aunt   . Heart disease Maternal Aunt   . Hyperlipidemia Brother   . Hypertension Brother        x 2  . Diabetes Brother   . Hypothyroidism  Brother   . Liver disease Maternal Grandmother   . Diabetes Paternal Grandmother   . Bone cancer Maternal Aunt        and skull  . Breast cancer Sister 102  . Diabetes Sister   . Hypothyroidism Sister   . Hashimoto's thyroiditis Sister   . Breast cancer Niece 85  . Colon cancer Neg Hx   . Colon polyps Neg Hx   . Esophageal cancer Neg Hx   . Rectal cancer Neg Hx   . Stomach cancer Neg Hx     Past Medical History:  Diagnosis Date  . Allergy   . Cancer [facility])    being worked up for [place]  . Colon polyp 12/29/2006  . 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 Hyperlipidemia   . Hypothyroidism   . Low kidney function   . Obesity    BMI 34  . Osteoporosis   . Palpitations   . Vitamin D deficiency     Current Outpatient Medications  Medication Sig Dispense Refill  . amiodarone (PACERONE) 200 MG tablet 400 mg twice a day x 7 days then 200 mg twice a day x7 days, then 200 mg daily 60 tablet 0  . ARMOUR THYROID 90 MG tablet Take 90 mg by mouth every morning.    . digoxin (LANOXIN) 0.125 MG tablet Take 1 tablet (0.125 mg total) by mouth daily. 30 tablet 0  . estradiol (VIVELLE-DOT) 0.075 MG/24HR Place 1 patch onto the skin 2 (two) times a week.     . ivabradine (CORLANOR) 5 MG TABS tablet Take 0.5 tablets (2.5 mg total) by mouth 2 (two) times daily with a meal. 30 tablet 0  . losartan (COZAAR) 25 MG tablet Take 0.5 tablets (12.5 mg total) by mouth 2 (two) times daily. 30 tablet 0  . Potassium Chloride ER 20 MEQ TBCR Take 20 mEq by mouth daily. 30 tablet 0  . progesterone (PROMETRIUM) 100 MG capsule Take 100 mg by mouth daily.     Marland Kitchen spironolactone (ALDACTONE) 25 MG tablet Take 1 tablet (25 mg total) by mouth daily. 30 tablet 0  . torsemide (DEMADEX) 20 MG tablet Take 2 tablets (40 mg total) by mouth daily. 60 tablet 0   No current facility-administered medications for this encounter.    Vitals:   07/08/19 1145  BP: 112/62  Pulse: 81  SpO2: 96%  Weight: 86.6 kg  Height: 5\' 2"  (1.575 m)     Wt Readings from Last 3 Encounters:  07/08/19 86.6 kg  07/02/19 87.5 kg  06/14/19 92.2 kg    PHYSICAL EXAM: General:  Well  appearing. No resp difficulty HEENT: normal Neck: supple. JVP flat. Carotids 2+ bilaterally; no bruits. No lymphadenopathy or thryomegaly appreciated. Cor: PMI normal. Regular rate & rhythm. No rubs, gallops or murmurs. L upper chest porta cath Lungs: clear Abdomen: soft, nontender, nondistended. No hepatosplenomegaly. No bruits or masses. Good bowel sounds. Extremities: no cyanosis, clubbing, rash, edema Neuro: alert & orientedx3, cranial nerves grossly intact. Moves all 4 extremities w/o difficulty. Affect pleasant.    ECG: NSR 79 bpm   ASSESSMENT & PLAN: 1. 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 Low output. Milrinone started 06/25/19 and stopped 06/30/19 - NYNA III. Volume status low. Appears dry. Cut back torsemide to 20 mg daily.  - Keep  off b-blocker - Continue digoxin. Dig level ok  - Continue spiro 25 daily - Contnue losartan 12.5 bid. BP too soft to titrate or switch to [employer]  - Check BMET today.  - Check ECHO in 3 months.   2. VT - Improved. Keep K> 4.0 Mg > 2.0 -With nausea will cut back amio to 200 mg daily.  -Life vest delivered.  -Plan to continue until ECHO repeated in  3 months.   3.Rt abd pain with acute cholecystitis. -suspect this is due to RV failure -Resolved.   4.Stage IV non-Hodgkin's lymphoma, mantle cell lymphoma - s/p R-CHOP w/ Neulasta - per Dr Earlie Server, conservative mgt for now  5. AKI - Check BMET   6. Transaminitis - suspect shock liver/RV failure - Resolved   Follow up in 2-3 weeks for additional med titration.   Raoul Ciano NP-C  3:38 PM

## 2019-07-08 NOTE — Patient Instructions (Signed)
DECREASE amiodarone to 200mg  daily  DECREASE torsemide to 20mg  daily  Routine lab work today. Will notify you of abnormal results  Follow up in 2-3 weeks.

## 2019-07-09 ENCOUNTER — Ambulatory Visit: Payer: BC Managed Care – PPO | Admitting: Adult Health

## 2019-07-10 DIAGNOSIS — C8590 Non-Hodgkin lymphoma, unspecified, unspecified site: Secondary | ICD-10-CM | POA: Diagnosis not present

## 2019-07-10 DIAGNOSIS — C8310 Mantle cell lymphoma, unspecified site: Secondary | ICD-10-CM | POA: Diagnosis not present

## 2019-07-10 DIAGNOSIS — I081 Rheumatic disorders of both mitral and tricuspid valves: Secondary | ICD-10-CM | POA: Diagnosis not present

## 2019-07-10 DIAGNOSIS — I5023 Acute on chronic systolic (congestive) heart failure: Secondary | ICD-10-CM | POA: Diagnosis not present

## 2019-07-10 DIAGNOSIS — E039 Hypothyroidism, unspecified: Secondary | ICD-10-CM | POA: Diagnosis not present

## 2019-07-10 DIAGNOSIS — I110 Hypertensive heart disease with heart failure: Secondary | ICD-10-CM | POA: Diagnosis not present

## 2019-07-10 DIAGNOSIS — I471 Supraventricular tachycardia: Secondary | ICD-10-CM | POA: Diagnosis not present

## 2019-07-10 DIAGNOSIS — R7303 Prediabetes: Secondary | ICD-10-CM | POA: Diagnosis not present

## 2019-07-10 DIAGNOSIS — I5082 Biventricular heart failure: Secondary | ICD-10-CM | POA: Diagnosis not present

## 2019-07-10 DIAGNOSIS — N179 Acute kidney failure, unspecified: Secondary | ICD-10-CM | POA: Diagnosis not present

## 2019-07-13 DIAGNOSIS — I110 Hypertensive heart disease with heart failure: Secondary | ICD-10-CM | POA: Diagnosis not present

## 2019-07-13 DIAGNOSIS — N179 Acute kidney failure, unspecified: Secondary | ICD-10-CM | POA: Diagnosis not present

## 2019-07-13 DIAGNOSIS — C8590 Non-Hodgkin lymphoma, unspecified, unspecified site: Secondary | ICD-10-CM | POA: Diagnosis not present

## 2019-07-13 DIAGNOSIS — I471 Supraventricular tachycardia: Secondary | ICD-10-CM | POA: Diagnosis not present

## 2019-07-13 DIAGNOSIS — I5082 Biventricular heart failure: Secondary | ICD-10-CM | POA: Diagnosis not present

## 2019-07-13 DIAGNOSIS — I5023 Acute on chronic systolic (congestive) heart failure: Secondary | ICD-10-CM | POA: Diagnosis not present

## 2019-07-13 DIAGNOSIS — R7303 Prediabetes: Secondary | ICD-10-CM | POA: Diagnosis not present

## 2019-07-13 DIAGNOSIS — E039 Hypothyroidism, unspecified: Secondary | ICD-10-CM | POA: Diagnosis not present

## 2019-07-13 DIAGNOSIS — C8310 Mantle cell lymphoma, unspecified site: Secondary | ICD-10-CM | POA: Diagnosis not present

## 2019-07-13 DIAGNOSIS — I081 Rheumatic disorders of both mitral and tricuspid valves: Secondary | ICD-10-CM | POA: Diagnosis not present

## 2019-07-14 ENCOUNTER — Telehealth (HOSPITAL_COMMUNITY): Payer: Self-pay | Admitting: *Deleted

## 2019-07-14 ENCOUNTER — Ambulatory Visit: Payer: BC Managed Care – PPO | Admitting: Internal Medicine

## 2019-07-14 NOTE — Telephone Encounter (Signed)
Verbal order given to home health for skilled nursing.   Kelly [phone]

## 2019-07-20 DIAGNOSIS — I5082 Biventricular heart failure: Secondary | ICD-10-CM | POA: Diagnosis not present

## 2019-07-20 DIAGNOSIS — R7303 Prediabetes: Secondary | ICD-10-CM | POA: Diagnosis not present

## 2019-07-20 DIAGNOSIS — I081 Rheumatic disorders of both mitral and tricuspid valves: Secondary | ICD-10-CM | POA: Diagnosis not present

## 2019-07-20 DIAGNOSIS — I110 Hypertensive heart disease with heart failure: Secondary | ICD-10-CM | POA: Diagnosis not present

## 2019-07-20 DIAGNOSIS — E039 Hypothyroidism, unspecified: Secondary | ICD-10-CM | POA: Diagnosis not present

## 2019-07-20 DIAGNOSIS — C8310 Mantle cell lymphoma, unspecified site: Secondary | ICD-10-CM | POA: Diagnosis not present

## 2019-07-20 DIAGNOSIS — I5023 Acute on chronic systolic (congestive) heart failure: Secondary | ICD-10-CM | POA: Diagnosis not present

## 2019-07-20 DIAGNOSIS — I471 Supraventricular tachycardia: Secondary | ICD-10-CM | POA: Diagnosis not present

## 2019-07-20 DIAGNOSIS — C8590 Non-Hodgkin lymphoma, unspecified, unspecified site: Secondary | ICD-10-CM | POA: Diagnosis not present

## 2019-07-20 DIAGNOSIS — N179 Acute kidney failure, unspecified: Secondary | ICD-10-CM | POA: Diagnosis not present

## 2019-07-21 DIAGNOSIS — I5082 Biventricular heart failure: Secondary | ICD-10-CM | POA: Diagnosis not present

## 2019-07-21 DIAGNOSIS — I081 Rheumatic disorders of both mitral and tricuspid valves: Secondary | ICD-10-CM | POA: Diagnosis not present

## 2019-07-21 DIAGNOSIS — I471 Supraventricular tachycardia: Secondary | ICD-10-CM | POA: Diagnosis not present

## 2019-07-21 DIAGNOSIS — N179 Acute kidney failure, unspecified: Secondary | ICD-10-CM | POA: Diagnosis not present

## 2019-07-21 DIAGNOSIS — C8590 Non-Hodgkin lymphoma, unspecified, unspecified site: Secondary | ICD-10-CM | POA: Diagnosis not present

## 2019-07-21 DIAGNOSIS — I5023 Acute on chronic systolic (congestive) heart failure: Secondary | ICD-10-CM | POA: Diagnosis not present

## 2019-07-21 DIAGNOSIS — C8310 Mantle cell lymphoma, unspecified site: Secondary | ICD-10-CM | POA: Diagnosis not present

## 2019-07-21 DIAGNOSIS — R7303 Prediabetes: Secondary | ICD-10-CM | POA: Diagnosis not present

## 2019-07-21 DIAGNOSIS — E039 Hypothyroidism, unspecified: Secondary | ICD-10-CM | POA: Diagnosis not present

## 2019-07-21 DIAGNOSIS — I110 Hypertensive heart disease with heart failure: Secondary | ICD-10-CM | POA: Diagnosis not present

## 2019-07-27 ENCOUNTER — Telehealth (HOSPITAL_COMMUNITY): Payer: Self-pay

## 2019-07-27 DIAGNOSIS — I420 Dilated cardiomyopathy: Secondary | ICD-10-CM | POA: Diagnosis not present

## 2019-07-27 NOTE — Telephone Encounter (Signed)
[place], PT home health called to report that the patient is being discharged from home health due to the patient missing visit this week. Also, the patient no longer wants to receive home health.  CB#[phone]

## 2019-07-28 ENCOUNTER — Other Ambulatory Visit: Payer: Self-pay

## 2019-07-28 ENCOUNTER — Inpatient Hospital Stay: Payer: BC Managed Care – PPO | Attending: Physician Assistant

## 2019-07-28 ENCOUNTER — Ambulatory Visit (HOSPITAL_COMMUNITY)
Admission: RE | Admit: 2019-07-28 | Discharge: 2019-07-28 | Disposition: A | Discharge status: Home / Self Care | Payer: BC Managed Care – PPO | Source: Ambulatory Visit | Attending: Cardiology | Admitting: Cardiology

## 2019-07-28 ENCOUNTER — Telehealth (HOSPITAL_COMMUNITY): Payer: Self-pay | Admitting: Pharmacist

## 2019-07-28 ENCOUNTER — Encounter (HOSPITAL_COMMUNITY): Payer: Self-pay

## 2019-07-28 VITALS — BP 130/82 | HR 83 | Wt 193.6 lb

## 2019-07-28 DIAGNOSIS — E669 Obesity, unspecified: Secondary | ICD-10-CM | POA: Diagnosis not present

## 2019-07-28 DIAGNOSIS — I5022 Chronic systolic (congestive) heart failure: Secondary | ICD-10-CM | POA: Diagnosis not present

## 2019-07-28 DIAGNOSIS — Z87891 Personal history of nicotine dependence: Secondary | ICD-10-CM | POA: Diagnosis not present

## 2019-07-28 DIAGNOSIS — I5082 Biventricular heart failure: Secondary | ICD-10-CM | POA: Insufficient documentation

## 2019-07-28 DIAGNOSIS — Z79890 Hormone replacement therapy (postmenopausal): Secondary | ICD-10-CM | POA: Insufficient documentation

## 2019-07-28 DIAGNOSIS — I428 Other cardiomyopathies: Secondary | ICD-10-CM | POA: Diagnosis not present

## 2019-07-28 DIAGNOSIS — Z95828 Presence of other vascular implants and grafts: Secondary | ICD-10-CM

## 2019-07-28 DIAGNOSIS — Z6835 Body mass index (BMI) 35.0-35.9, adult: Secondary | ICD-10-CM | POA: Diagnosis not present

## 2019-07-28 DIAGNOSIS — Z79899 Other long term (current) drug therapy: Secondary | ICD-10-CM | POA: Diagnosis not present

## 2019-07-28 DIAGNOSIS — Z800 Family history of malignant neoplasm of digestive organs: Secondary | ICD-10-CM | POA: Diagnosis not present

## 2019-07-28 DIAGNOSIS — Z808 Family history of malignant neoplasm of other organs or systems: Secondary | ICD-10-CM | POA: Diagnosis not present

## 2019-07-28 DIAGNOSIS — E039 Hypothyroidism, unspecified: Secondary | ICD-10-CM | POA: Diagnosis not present

## 2019-07-28 DIAGNOSIS — E785 Hyperlipidemia, unspecified: Secondary | ICD-10-CM | POA: Insufficient documentation

## 2019-07-28 DIAGNOSIS — Z9221 Personal history of antineoplastic chemotherapy: Secondary | ICD-10-CM | POA: Diagnosis not present

## 2019-07-28 DIAGNOSIS — C8310 Mantle cell lymphoma, unspecified site: Secondary | ICD-10-CM | POA: Diagnosis not present

## 2019-07-28 DIAGNOSIS — M810 Age-related osteoporosis without current pathological fracture: Secondary | ICD-10-CM | POA: Insufficient documentation

## 2019-07-28 DIAGNOSIS — Z8572 Personal history of non-Hodgkin lymphomas: Secondary | ICD-10-CM | POA: Insufficient documentation

## 2019-07-28 DIAGNOSIS — Z8249 Family history of ischemic heart disease and other diseases of the circulatory system: Secondary | ICD-10-CM | POA: Insufficient documentation

## 2019-07-28 DIAGNOSIS — I472 Ventricular tachycardia: Secondary | ICD-10-CM | POA: Insufficient documentation

## 2019-07-28 DIAGNOSIS — Z803 Family history of malignant neoplasm of breast: Secondary | ICD-10-CM | POA: Insufficient documentation

## 2019-07-28 LAB — BASIC METABOLIC PANEL
Anion gap: 13 (ref 5–15)
BUN: 14 mg/dL (ref 8–23)
CO2: 26 mmol/L (ref 22–32)
Calcium: 9.7 mg/dL (ref 8.9–10.3)
Chloride: 99 mmol/L (ref 98–111)
Creatinine, Ser: 0.98 mg/dL (ref 0.44–1.00)
GFR calc Af Amer: >60 mL/min (ref >60)
GFR calc non Af Amer: >60 mL/min (ref >60)
Glucose, Bld: 91 mg/dL (ref 70–99)
Potassium: 3.8 mmol/L (ref 3.5–5.1)
Sodium: 138 mmol/L (ref 135–145)

## 2019-07-28 LAB — DIGOXIN LEVEL: Digoxin Level: 0.8 ng/mL (ref 0.8–2.0)

## 2019-07-28 MED ORDER — SODIUM CHLORIDE 0.9% FLUSH
10.0000 mL | INTRAVENOUS | Status: DC | PRN
  Administered 2019-07-28: 10 mL
  Filled 2019-07-28: qty 10

## 2019-07-28 MED ORDER — IVABRADINE HCL 5 MG PO TABS: 2.5000 mg | ORAL_TABLET | Freq: Two times a day (BID) | ORAL | 0 refills | Status: DC

## 2019-07-28 MED ORDER — IVABRADINE HCL 5 MG PO TABS: 5.0000 mg | ORAL_TABLET | Freq: Two times a day (BID) | ORAL | 4 refills | Status: DC

## 2019-07-28 MED ORDER — HEPARIN SOD (PORK) LOCK FLUSH 100 UNIT/ML IV SOLN
500.0000 [IU] | Freq: Once | INTRAVENOUS | Status: AC | PRN
  Administered 2019-07-28: 500 [IU]
  Filled 2019-07-28: qty 5

## 2019-07-28 MED ORDER — ENTRESTO 24-26 MG PO TABS
1.0000 | ORAL_TABLET | Freq: Two times a day (BID) | ORAL | 6 refills | Status: DC
Start: 2019-07-28 — End: 2019-08-26

## 2019-07-28 NOTE — Patient Instructions (Signed)

## 2019-07-28 NOTE — Progress Notes (Signed)
PCP: Dr Melford Aase Primary Cardiologist: Dr Haroldine Laws  Oncology: Dr Julien Nordmann  Reason for Visit: F/u for Chronic Systolic Heart Failure and Medication Titration   HPI: Natalie Baker a 64 y.o.femalewith a hx of palpitations thought to be PVCs, HTN, non hodgkins lymphoma last treatment (R CHOP).  Admitted 06/2019 to [facility] with increased shortness of breath and abdominal discomfort. ECHO showed severely reduced EF 20-25% w/ mod-severe MR.  RV moderately reduced.  She developed VT and was started on amiodarone and transferred to [facility] for AHF consultation. R/LHC showed normal coronaries, elevated filling pressures w/ low CO c/w cardiogenic shock. CI was 1.7. She was started on milrinone, IV amiodarone and IV Lasix for diuresis. Responded well to therapy and gradually weaned off milrinone. Placed on spiro, losartan, and dig. Not on entresto due to soft BP. No BB with low output. Was discharged home w/ LifeVest.   Had post hospital f/u on 7/1. Had NYHA III symptoms. Wt was down 3 lb since discharge. Complained of nausea after taking medications. Felt to be dry on exam. Torsemide was reduced down to 20 mg daily and amiodarone reduced to 200 mg daily (felt to be contributing to nausea). BP remained too soft to transition to [employer]. Labs were stable.   She returns to clinic today for repeat assessment and for further med titration. Here w/ daughter. Doing better. Less nausea w/ amio reduction. Also thinks more GI as seems to be related to certain foods. Wt stable w/ torsemide reduction. Functional status improving some, now NYHA II-III. Walking around neighborhood for exercise. The other day, she walked 1/4 of a mile w/o exertional symptoms. Reports full med compliance. No dizziness. BP stable today 580D systolic. HR mid 80s on Corlanor.   Life Vest ZOll: Attempted to generate report from [place] but website currently down. She denies shocks, will reach out to Zoll rep for report documents.   ECHO  06/2019 EF 20-25%  moderately reduced RV function with severe MR/TR  Cath 6/18 with normal cors. Low output. Ao = 109/71 (81) LV =  99/23 RA =  15 RV =  43/18 PA =  35/14 (25) PCW = 22 Fick cardiac output/index = 3.3/1.7 PVR = < 1.0 WU Ao sat = 100% PA sat = 49%, 53% Assessment: 1. Normal coronary arteries 2. Severe NICM EF 20% 3. Elevated filling pressures and low CO c/w cardiogenic shock 4. Brief run of VT   ROS: All systems negative except as listed in HPI, PMH and Problem List.  SH:  Social History   Socioeconomic History  . Marital status: Married    Spouse name: Not on file  . Number of children: 1  . Years of education: Not on file  . Highest education level: Not on file  Occupational History  . Occupation: Theatre manager  Tobacco Use  . Smoking status: Former Smoker    Years: 15.00    Types: Cigarettes    Quit date: 01/08/1996    Years since quitting: 23.5  . Smokeless tobacco: Never Used  . Tobacco comment: quit 1998 but is exposed to 2nd hand smoke  Vaping Use  . Vaping Use: Never used  Substance and Sexual Activity  . Alcohol use: No  . Drug use: No  . Sexual activity: Yes    Partners: Male    Birth control/protection: Post-menopausal  Other Topics Concern  . 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 Difficulty of Paying Living Expenses:   Food Insecurity:   . Worried About Charity fundraiser in the Last Year:   . Arboriculturist in the Last Year:   Transportation Needs: No Transportation Needs  . Lack of Transportation (Medical): No  . Lack of Transportation (Non-Medical): No  Physical Activity: Inactive  . Days of Exercise per Week: 0 days  . Minutes of Exercise per Session: 0 min  Stress: No Stress Concern Present  . Feeling of Stress : Only a little  Social Connections: Moderately  Integrated  . Frequency of Communication with Friends and Family: More than three times a week  . Frequency of Social Gatherings with Friends and Family: Once a week  . Attends Religious Services: More than 4 times per year  . Active Member of Clubs or Organizations: No  . Attends Archivist Meetings: Never  . Marital Status: Married  Human resources officer Violence:   . Fear of Current or Ex-Partner:   . Emotionally Abused:   Marland Kitchen Physically Abused:   . Sexually Abused:     FH:  Family History  Problem Relation Age of Onset  . Heart disease Mother 3       chf  . Diabetes Mother   . Thyroid disease Mother   . Cancer Mother 77       female/ skin  . Hypertension Mother   . Liver disease Mother   . Hypertension Father   . Alcohol abuse Father   . Heart disease Father   . Breast cancer Maternal Aunt   . Hyperlipidemia Maternal Aunt   . Heart disease Maternal Aunt   . Hyperlipidemia Brother   . Hypertension Brother        x 2  . Diabetes Brother   . Hypothyroidism Brother   . Liver disease Maternal Grandmother   . Diabetes Paternal Grandmother   . Bone cancer Maternal Aunt        and skull  . Breast cancer Sister 59  . Diabetes Sister   . Hypothyroidism Sister   . Hashimoto's thyroiditis Sister   . Breast cancer Niece 42  . Colon cancer Neg Hx   . Colon polyps Neg Hx   . Esophageal cancer Neg Hx   . Rectal cancer Neg Hx   . Stomach cancer Neg Hx     Past Medical History:  Diagnosis Date  . Allergy   . Cancer [facility])    being worked up for [place]  . Colon polyp 12/29/2006  . 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 Hyperlipidemia   . Hypothyroidism   . Low kidney function   . Obesity    BMI 34  . Osteoporosis   . Palpitations   . Vitamin D deficiency     Current Outpatient Medications  Medication Sig Dispense Refill  . amiodarone (PACERONE) 200 MG tablet Take 1 tablet (200 mg total) by mouth daily. 30 tablet 3  . ARMOUR THYROID 90 MG tablet Take 90 mg by mouth  every morning.    . digoxin (LANOXIN) 0.125 MG tablet Take 1 tablet (0.125 mg total) by mouth daily. 30 tablet 0  . estradiol (VIVELLE-DOT) 0.075 MG/24HR Place 1 patch onto the skin 2 (two) times a week.     . ivabradine (CORLANOR) 5 MG TABS tablet Take 0.5 tablets (2.5 mg total) by mouth 2 (two) times daily with a meal. 30 tablet 0  .  losartan (COZAAR) 25 MG tablet Take 0.5 tablets (12.5 mg total) by mouth 2 (two) times daily. 30 tablet 0  . Potassium Chloride ER 20 MEQ TBCR Take 20 mEq by mouth daily. 30 tablet 0  . progesterone (PROMETRIUM) 100 MG capsule Take 100 mg by mouth daily.     Marland Kitchen spironolactone (ALDACTONE) 25 MG tablet Take 1 tablet (25 mg total) by mouth daily. 30 tablet 0  . torsemide (DEMADEX) 20 MG tablet Take 1 tablet (20 mg total) by mouth daily. 30 tablet 3   No current facility-administered medications for this encounter.    Vitals:   07/28/19 1057  BP: 130/82  Pulse: 83  SpO2: 96%  Weight: 87.8 kg (193 lb 9.6 oz)   Wt Readings from Last 3 Encounters:  07/28/19 87.8 kg (193 lb 9.6 oz)  07/08/19 86.6 kg (191 lb)  07/02/19 87.5 kg (192 lb 12.8 oz)   PHYSICAL EXAM: General:  Well appearing. No respiratory difficulty HEENT: normal Neck: supple. no JVD. Carotids 2+ bilat; no bruits. No lymphadenopathy or thyromegaly appreciated. Cor: PMI nondisplaced. Regular rate & rhythm. No rubs, gallops or murmurs. + Lt upper chest port-a-cath  Lungs: clear Abdomen: soft, nontender, nondistended. No hepatosplenomegaly. No bruits or masses. Good bowel sounds. Extremities: no cyanosis, clubbing, rash, edema Neuro: alert & oriented x 3, cranial nerves grossly intact. moves all 4 extremities w/o difficulty. Affect pleasant.     ECG: NSR 79 bpm   ASSESSMENT & PLAN: 1. 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 RV normal - Echo EF20-25% moderately reduced RV function with severe MR/TR - most likely from R-CHOP - Cath 06/25/19 no CAD. Low output  (CI 1.7). Milrinone started 06/25/19 and stopped 06/30/19 - NYNA II-III. Euvolemic on exam. Wt stable. SBP 130s - Stop losartan.  - Start Entresto 24-26 mg bid - Continue torsemide 20 mg daily for now (may be able to later reduce frequency w/ Entresto titration) - Check BMP today and again in 7 days  - Continue Spiro 25 mg daily  - Continue digoxin 0.125 mg. Check Dig level today  - Keep  off b-blocker for now - Increase Corlanor to 5 mg bid (HR mid 80s) - Check repeat ECHO in 3 months. F/u q2-3 weeks for med titration   2. VT - Has LifeVest, no shocks  - Attempted to generate report from [place] but website currently down. She denies shocks, will reach out to Zoll rep for report documents.  - Continue Amiodarone 200 mg daily  - Plan to continue LifeVest until ECHO repeated in  3 months.  - If EF remains < 35%, will refer to EP for ICD    3.Stage IV non-Hodgkin's lymphoma, mantle cell lymphoma - s/p R-CHOP w/ Neulasta - per Dr Earlie Server, conservative mgt for now   Follow up in 2-3 weeks for additional med titration. Next appt w/ PharmD. APP or Dr. Haroldine Laws in 6 weeks.   Tudor Chandley  PA-C 11:23 AM

## 2019-07-28 NOTE — Telephone Encounter (Signed)
Advanced Heart Failure Patient Advocate Encounter  Prior Authorization for Corlanor has been approved.    Effective dates: 07/28/2019 through 07/26/2020.  Audry Riles, PharmD, BCPS, BCCP, CPP Heart Failure Clinic Pharmacist [phone]

## 2019-07-28 NOTE — Progress Notes (Signed)
Medication Samples have been provided to the patient.  Drug name: corlanor       Strength: 5 mg        Qty: 28  LOT: [id number]  Exp.Date: 07/2022  Dosing instructions: one tab twice daily  The patient has been instructed regarding the correct time, dose, and frequency of taking this medication, including desired effects and most common side effects.   Kerry Dory 11:38 AM 07/28/2019;

## 2019-07-28 NOTE — Patient Instructions (Signed)
INCREASE Corlanor to 5 mg, one tab twice daily STOP Losartan START Entresto 24/26 mg, one tab twice daily  Labs today We will only contact you if something comes back abnormal or we need to make some changes. Otherwise no news is good news!  Labs needed in 7-10 days  Your physician recommends that you schedule a follow-up appointment in: 3 week with Pharmacy Team  Your physician recommends that you schedule a follow-up appointment in: 6 weeks with Dr Haroldine Laws or  in the Advanced Practitioners (PA/NP) Clinic    Do the following things EVERYDAY: 1) Weigh yourself in the morning before breakfast. Write it down and keep it in a log. 2) Take your medicines as prescribed 3) Eat low salt foods--Limit salt (sodium) to 2000 mg per day.  4) Stay as active as you can everyday 5) Limit all fluids for the day to less than 2 liters  If you have any questions or concerns before your next appointment please send [place] a message through [place] or call our office at [phone].    TO LEAVE A MESSAGE FOR THE NURSE SELECT OPTION 2, PLEASE LEAVE A MESSAGE INCLUDING: . YOUR NAME . DATE OF BIRTH . CALL BACK NUMBER . REASON FOR CALL**this is important as we prioritize the call backs  YOU WILL RECEIVE A CALL BACK THE SAME DAY AS LONG AS YOU CALL BEFORE 4:00 PM

## 2019-08-05 ENCOUNTER — Other Ambulatory Visit: Payer: Self-pay

## 2019-08-05 ENCOUNTER — Ambulatory Visit (HOSPITAL_COMMUNITY)
Admission: RE | Admit: 2019-08-05 | Discharge: 2019-08-05 | Disposition: A | Discharge status: Home / Self Care | Payer: BC Managed Care – PPO | Source: Ambulatory Visit | Attending: Internal Medicine | Admitting: Internal Medicine

## 2019-08-05 DIAGNOSIS — I5022 Chronic systolic (congestive) heart failure: Secondary | ICD-10-CM | POA: Insufficient documentation

## 2019-08-05 LAB — BASIC METABOLIC PANEL
Anion gap: 11 (ref 5–15)
BUN: 15 mg/dL (ref 8–23)
CO2: 27 mmol/L (ref 22–32)
Calcium: 9.8 mg/dL (ref 8.9–10.3)
Chloride: 100 mmol/L (ref 98–111)
Creatinine, Ser: 1.07 mg/dL — ABNORMAL HIGH (ref 0.44–1.00)
GFR calc Af Amer: >60 mL/min (ref >60)
GFR calc non Af Amer: 55 mL/min — ABNORMAL LOW (ref >60)
Glucose, Bld: 111 mg/dL — ABNORMAL HIGH (ref 70–99)
Potassium: 3.9 mmol/L (ref 3.5–5.1)
Sodium: 138 mmol/L (ref 135–145)

## 2019-08-07 ENCOUNTER — Other Ambulatory Visit (HOSPITAL_COMMUNITY): Payer: Self-pay | Admitting: Adult Health

## 2019-08-09 ENCOUNTER — Telehealth (HOSPITAL_COMMUNITY): Payer: Self-pay | Admitting: Cardiology

## 2019-08-09 DIAGNOSIS — I5022 Chronic systolic (congestive) heart failure: Secondary | ICD-10-CM

## 2019-08-09 MED ORDER — DIGOXIN 125 MCG PO TABS: 0.0625 mg | ORAL_TABLET | Freq: Every day | ORAL | 3 refills | Status: DC

## 2019-08-09 NOTE — Telephone Encounter (Signed)
Pt aware and voiced understanding Stop torsemide Continue with plans to start entresto 24/26 Hold dig x 2 days then decrease to 1/2 tab daily (0.0625mg ) Repeat labs x 1 week

## 2019-08-09 NOTE — Telephone Encounter (Signed)
-----   Message from Consuelo Pandy, [place] sent at 07/28/2019  5:18 PM EDT ----- SCr and K stable. Stop lasix and start Entresto 24-26 mg bid.   Dig level mildly elevated. Hold x 2 days, then reduce dose down to 0.0625 mg  Repeat BMP and dig level in 7 days

## 2019-08-11 ENCOUNTER — Telehealth (HOSPITAL_COMMUNITY): Payer: Self-pay | Admitting: *Deleted

## 2019-08-11 NOTE — Telephone Encounter (Signed)
Pt called to report she stopped the Torsemide as advised and today her wt is up 2.2 lbs and she feels like she is starting to retain fluid. Advised Torsemide was stopped due to diuretic in entresto that was started, advised kidney function was stable so ok to take 1 dose of torsemide today and see if that helps, advised if she has to keep taking it to let us know, she is already sch for repeat labs next week

## 2019-08-17 ENCOUNTER — Other Ambulatory Visit: Payer: Self-pay

## 2019-08-17 ENCOUNTER — Ambulatory Visit (HOSPITAL_COMMUNITY)
Admission: RE | Admit: 2019-08-17 | Discharge: 2019-08-17 | Disposition: A | Discharge status: Home / Self Care | Payer: BC Managed Care – PPO | Source: Ambulatory Visit | Attending: Cardiology | Admitting: Cardiology

## 2019-08-17 DIAGNOSIS — I5022 Chronic systolic (congestive) heart failure: Secondary | ICD-10-CM | POA: Diagnosis not present

## 2019-08-17 LAB — BASIC METABOLIC PANEL
Anion gap: 9 (ref 5–15)
BUN: 12 mg/dL (ref 8–23)
CO2: 29 mmol/L (ref 22–32)
Calcium: 9.6 mg/dL (ref 8.9–10.3)
Chloride: 98 mmol/L (ref 98–111)
Creatinine, Ser: 1.09 mg/dL — ABNORMAL HIGH (ref 0.44–1.00)
GFR calc Af Amer: >60 mL/min (ref >60)
GFR calc non Af Amer: 54 mL/min — ABNORMAL LOW (ref >60)
Glucose, Bld: 101 mg/dL — ABNORMAL HIGH (ref 70–99)
Potassium: 3.7 mmol/L (ref 3.5–5.1)
Sodium: 136 mmol/L (ref 135–145)

## 2019-08-17 LAB — DIGOXIN LEVEL: Digoxin Level: 0.5 ng/mL — ABNORMAL LOW (ref 0.8–2.0)

## 2019-08-18 ENCOUNTER — Encounter: Payer: BC Managed Care – PPO | Admitting: Adult Health

## 2019-08-19 ENCOUNTER — Other Ambulatory Visit (HOSPITAL_COMMUNITY): Payer: Self-pay | Admitting: Adult Health

## 2019-08-19 MED ORDER — DIGOXIN 125 MCG PO TABS: 0.0625 mg | ORAL_TABLET | Freq: Every day | ORAL | 3 refills | Status: DC

## 2019-08-19 MED ORDER — IVABRADINE HCL 5 MG PO TABS: 5.0000 mg | ORAL_TABLET | Freq: Two times a day (BID) | ORAL | 3 refills | Status: DC

## 2019-08-19 MED ORDER — AMIODARONE HCL 200 MG PO TABS: 200.0000 mg | ORAL_TABLET | Freq: Every day | ORAL | 3 refills | Status: DC

## 2019-08-19 MED ORDER — POTASSIUM CHLORIDE ER 20 MEQ PO TBCR: 1.0000 | EXTENDED_RELEASE_TABLET | Freq: Every day | ORAL | 3 refills | Status: DC

## 2019-08-19 NOTE — Telephone Encounter (Signed)
Patient called needing a refill on all HF Medications. All HF Rx refilled;patient was very appreciative

## 2019-08-26 ENCOUNTER — Telehealth (HOSPITAL_COMMUNITY): Payer: Self-pay

## 2019-08-26 MED ORDER — LOSARTAN POTASSIUM 25 MG PO TABS
12.5000 mg | ORAL_TABLET | Freq: Two times a day (BID) | ORAL | 0 refills | Status: DC
Start: 2019-08-26 — End: 2019-10-26

## 2019-08-26 NOTE — Addendum Note (Signed)
Addended by: Shela Nevin R on: 8/63/8177 04:44 PM   Modules accepted: Orders

## 2019-08-26 NOTE — Telephone Encounter (Signed)
Patient advised and verbalized understanding. Med list updated to reflect changes,new Rx for losartan was sent into patients pharmacy on file.pt already has a pending appt with app clinic on 9/2

## 2019-08-26 NOTE — Telephone Encounter (Signed)
Patient called and left a message on the triage vm stating that she has developed a cough since starting the Entresto 24-26mg  1 tab bid. After speaking with patient she states that she has a dry cough denies sob,sore throat,nasal drainage but has had a headache for the past 2 days. She also states that she has been experiencing some dizziness and fatigue. Her blood pressure readings yesterday around 1pm were 104/51 HR 61 and at 7pm it was 111/65 HR 66. Today her BP readings around 12:30 were 129/62 HR 68 and at 2pm it was 119/66 HR 62.   Please advise,current med list is listed below  Current Outpatient Medications on File Prior to Visit  Medication Sig Dispense Refill  . amiodarone (PACERONE) 200 MG tablet Take 1 tablet (200 mg total) by mouth daily. 90 tablet 3  . ARMOUR THYROID 90 MG tablet Take 90 mg by mouth every morning.    . digoxin (LANOXIN) 0.125 MG tablet Take 0.5 tablets (0.0625 mg total) by mouth daily. 45 tablet 3  . estradiol (VIVELLE-DOT) 0.075 MG/24HR Place 1 patch onto the skin 2 (two) times a week.     . ivabradine (CORLANOR) 5 MG TABS tablet Take 1 tablet (5 mg total) by mouth 2 (two) times daily with a meal. 180 tablet 3  . Potassium Chloride ER 20 MEQ TBCR Take 1 tablet by mouth daily. 90 tablet 3  . progesterone (PROMETRIUM) 100 MG capsule Take 100 mg by mouth daily.     . sacubitril-valsartan (ENTRESTO) 24-26 MG Take 1 tablet by mouth 2 (two) times daily. 60 tablet 6  . spironolactone (ALDACTONE) 25 MG tablet TAKE 1 TABLET BY MOUTH EVERY DAY 90 tablet 3  . [DISCONTINUED] prochlorperazine (COMPAZINE) 10 MG tablet Take 1 tablet (10 mg total) by mouth every 6 (six) hours as needed for nausea or vomiting. 30 tablet 3   No current facility-administered medications on file prior to visit.

## 2019-08-27 DIAGNOSIS — I420 Dilated cardiomyopathy: Secondary | ICD-10-CM | POA: Diagnosis not present

## 2019-09-08 ENCOUNTER — Encounter: Payer: Self-pay | Admitting: Internal Medicine

## 2019-09-09 ENCOUNTER — Ambulatory Visit (HOSPITAL_COMMUNITY)
Admission: RE | Admit: 2019-09-09 | Discharge: 2019-09-09 | Disposition: A | Discharge status: Home / Self Care | Payer: BC Managed Care – PPO | Source: Ambulatory Visit | Attending: Internal Medicine | Admitting: Internal Medicine

## 2019-09-09 ENCOUNTER — Encounter (HOSPITAL_COMMUNITY): Payer: Self-pay

## 2019-09-09 ENCOUNTER — Inpatient Hospital Stay: Payer: BC Managed Care – PPO | Attending: Physician Assistant

## 2019-09-09 ENCOUNTER — Other Ambulatory Visit: Payer: Self-pay

## 2019-09-09 VITALS — BP 126/78 | HR 60 | Ht 62.0 in | Wt 192.0 lb

## 2019-09-09 DIAGNOSIS — R109 Unspecified abdominal pain: Secondary | ICD-10-CM | POA: Diagnosis not present

## 2019-09-09 DIAGNOSIS — Z833 Family history of diabetes mellitus: Secondary | ICD-10-CM | POA: Insufficient documentation

## 2019-09-09 DIAGNOSIS — E785 Hyperlipidemia, unspecified: Secondary | ICD-10-CM | POA: Insufficient documentation

## 2019-09-09 DIAGNOSIS — Z452 Encounter for adjustment and management of vascular access device: Secondary | ICD-10-CM | POA: Diagnosis not present

## 2019-09-09 DIAGNOSIS — Z5181 Encounter for therapeutic drug level monitoring: Secondary | ICD-10-CM | POA: Insufficient documentation

## 2019-09-09 DIAGNOSIS — Z6834 Body mass index (BMI) 34.0-34.9, adult: Secondary | ICD-10-CM | POA: Insufficient documentation

## 2019-09-09 DIAGNOSIS — Z79899 Other long term (current) drug therapy: Secondary | ICD-10-CM | POA: Diagnosis not present

## 2019-09-09 DIAGNOSIS — R0602 Shortness of breath: Secondary | ICD-10-CM | POA: Insufficient documentation

## 2019-09-09 DIAGNOSIS — I428 Other cardiomyopathies: Secondary | ICD-10-CM | POA: Insufficient documentation

## 2019-09-09 DIAGNOSIS — C8310 Mantle cell lymphoma, unspecified site: Secondary | ICD-10-CM | POA: Insufficient documentation

## 2019-09-09 DIAGNOSIS — Z8349 Family history of other endocrine, nutritional and metabolic diseases: Secondary | ICD-10-CM | POA: Insufficient documentation

## 2019-09-09 DIAGNOSIS — Z79890 Hormone replacement therapy (postmenopausal): Secondary | ICD-10-CM | POA: Diagnosis not present

## 2019-09-09 DIAGNOSIS — R05 Cough: Secondary | ICD-10-CM | POA: Insufficient documentation

## 2019-09-09 DIAGNOSIS — Z803 Family history of malignant neoplasm of breast: Secondary | ICD-10-CM | POA: Insufficient documentation

## 2019-09-09 DIAGNOSIS — Z8249 Family history of ischemic heart disease and other diseases of the circulatory system: Secondary | ICD-10-CM | POA: Insufficient documentation

## 2019-09-09 DIAGNOSIS — I5022 Chronic systolic (congestive) heart failure: Secondary | ICD-10-CM | POA: Insufficient documentation

## 2019-09-09 DIAGNOSIS — E669 Obesity, unspecified: Secondary | ICD-10-CM | POA: Diagnosis not present

## 2019-09-09 DIAGNOSIS — E039 Hypothyroidism, unspecified: Secondary | ICD-10-CM | POA: Diagnosis not present

## 2019-09-09 DIAGNOSIS — Z791 Long term (current) use of non-steroidal anti-inflammatories (NSAID): Secondary | ICD-10-CM | POA: Diagnosis not present

## 2019-09-09 DIAGNOSIS — I472 Ventricular tachycardia: Secondary | ICD-10-CM | POA: Insufficient documentation

## 2019-09-09 DIAGNOSIS — Z95828 Presence of other vascular implants and grafts: Secondary | ICD-10-CM

## 2019-09-09 DIAGNOSIS — C8318 Mantle cell lymphoma, lymph nodes of multiple sites: Secondary | ICD-10-CM | POA: Insufficient documentation

## 2019-09-09 DIAGNOSIS — Z7901 Long term (current) use of anticoagulants: Secondary | ICD-10-CM | POA: Diagnosis not present

## 2019-09-09 DIAGNOSIS — Z87891 Personal history of nicotine dependence: Secondary | ICD-10-CM | POA: Diagnosis not present

## 2019-09-09 DIAGNOSIS — Z8719 Personal history of other diseases of the digestive system: Secondary | ICD-10-CM | POA: Diagnosis not present

## 2019-09-09 LAB — CBC
HCT: 46.1 % — ABNORMAL HIGH (ref 36.0–46.0)
Hemoglobin: 14.2 g/dL (ref 12.0–15.0)
MCH: 26.4 pg (ref 26.0–34.0)
MCHC: 30.8 g/dL (ref 30.0–36.0)
MCV: 85.8 fL (ref 80.0–100.0)
Platelets: 285 10*3/uL (ref 150–400)
RBC: 5.37 MIL/uL — ABNORMAL HIGH (ref 3.87–5.11)
RDW: 16.6 % — ABNORMAL HIGH (ref 11.5–15.5)
WBC: 9.5 10*3/uL (ref 4.0–10.5)
nRBC: 0 % (ref 0.0–0.2)

## 2019-09-09 LAB — BRAIN NATRIURETIC PEPTIDE: B Natriuretic Peptide: 239.6 pg/mL — ABNORMAL HIGH (ref 0.0–100.0)

## 2019-09-09 LAB — COMPREHENSIVE METABOLIC PANEL
ALT: 23 U/L (ref 0–44)
AST: 19 U/L (ref 15–41)
Albumin: 3.8 g/dL (ref 3.5–5.0)
Alkaline Phosphatase: 61 U/L (ref 38–126)
Anion gap: 12 (ref 5–15)
BUN: 14 mg/dL (ref 8–23)
CO2: 25 mmol/L (ref 22–32)
Calcium: 9.3 mg/dL (ref 8.9–10.3)
Chloride: 101 mmol/L (ref 98–111)
Creatinine, Ser: 0.89 mg/dL (ref 0.44–1.00)
GFR calc Af Amer: >60 mL/min (ref >60)
GFR calc non Af Amer: >60 mL/min (ref >60)
Glucose, Bld: 95 mg/dL (ref 70–99)
Potassium: 3.7 mmol/L (ref 3.5–5.1)
Sodium: 138 mmol/L (ref 135–145)
Total Bilirubin: 0.6 mg/dL (ref 0.3–1.2)
Total Protein: 6.8 g/dL (ref 6.5–8.1)

## 2019-09-09 LAB — DIGOXIN LEVEL: Digoxin Level: 0.4 ng/mL — ABNORMAL LOW (ref 0.8–2.0)

## 2019-09-09 LAB — TSH: TSH: 1.535 u[IU]/mL (ref 0.350–4.500)

## 2019-09-09 MED ORDER — HEPARIN SOD (PORK) LOCK FLUSH 100 UNIT/ML IV SOLN
500.0000 [IU] | Freq: Once | INTRAVENOUS | Status: AC | PRN
  Administered 2019-09-09: 500 [IU]
  Filled 2019-09-09: qty 5

## 2019-09-09 MED ORDER — DAPAGLIFLOZIN PROPANEDIOL 10 MG PO TABS: 10.0000 mg | ORAL_TABLET | Freq: Every day | ORAL | 11 refills | Status: DC

## 2019-09-09 MED ORDER — SODIUM CHLORIDE 0.9% FLUSH
10.0000 mL | INTRAVENOUS | Status: DC | PRN
  Administered 2019-09-09: 10 mL
  Filled 2019-09-09: qty 10

## 2019-09-09 NOTE — Patient Instructions (Signed)
START Farxiga 10 mg, one tab daily  Labs today We will only contact you if something comes back abnormal or we need to make some changes. Otherwise no news is good news!  Your physician has requested that you have an echocardiogram. Echocardiography is a painless test that uses sound waves to create images of your heart. It provides your doctor with information about the size and shape of your heart and how well your heart's chambers and valves are working. This procedure takes approximately one hour. There are no restrictions for this procedure.  Your physician recommends that you schedule a follow-up appointment in: 3-4 week  If you have any questions or concerns before your next appointment please send [place] a message through [place] or call our office at [phone].    TO LEAVE A MESSAGE FOR THE NURSE SELECT OPTION 2, PLEASE LEAVE A MESSAGE INCLUDING: . YOUR NAME . DATE OF BIRTH . CALL BACK NUMBER . REASON FOR CALL**this is important as we prioritize the call backs  YOU WILL RECEIVE A CALL BACK THE SAME DAY AS LONG AS YOU CALL BEFORE 4:00 PM

## 2019-09-09 NOTE — Progress Notes (Signed)
PCP: Dr Melford Aase Primary Cardiologist: Dr Haroldine Laws  Oncology: Dr Julien Nordmann  Reason for Visit: F/u for Chronic Systolic Heart Failure and Medication Titration   HPI: Natalie L Elliottis a 64 y.o.femalewith a hx of palpitations thought to be PVCs, HTN, non hodgkins lymphoma treated w/ R-CHOP.  Admitted 06/2019 to [facility] with increased shortness of breath and abdominal discomfort. ECHO showed severely reduced EF 20-25% w/ mod-severe MR.  RV moderately reduced.  She developed VT and was started on amiodarone and transferred to [facility] for AHF consultation. R/LHC showed normal coronaries, elevated filling pressures w/ low CO c/w cardiogenic shock. CI was 1.7. She was started on milrinone, IV amiodarone and IV Lasix for diuresis. Responded well to therapy and gradually weaned off milrinone. Placed on spiro, losartan, and dig. No BB with low output. Torsemide added for volume control. Was discharged home w/ LifeVest.   She presents to clinic today for f/u for systolic heart failure and for further med titration. She is here w/ her daughter. Over the last several visits, we have added corlanor. She was tried on Entresto but developed cough after initiation. She was switched back to Losartan.       Today, she reports that she is doing well. BP 130/83, but she reports lower readings at home, in the low [id number] systolic (she thinks higher BP reading today is due to feeling nevous when she goes to doctors office). HR 60 bpm.  Her wt is up 2 lb from previous visit. She continues w/ chronic dry cough but it is better off of Entresto. She is still wearing her LifeVest. No alarms. No shocks. She denies any significant dyspnea. Able to complete ADLs w/o much limitation.    ECHO 06/2019 EF 20-25%  moderately reduced RV function with severe MR/TR  Cath 6/18 with normal cors. Low output. Ao = 109/71 (81) LV =  99/23 RA =  15 RV =  43/18 PA =  35/14 (25) PCW = 22 Fick cardiac output/index = 3.3/1.7 PVR = < 1.0  WU Ao sat = 100% PA sat = 49%, 53% Assessment: 1. Normal coronary arteries 2. Severe NICM EF 20% 3. Elevated filling pressures and low CO c/w cardiogenic shock 4. Brief run of VT   ROS: All systems negative except as listed in HPI, PMH and Problem List.  SH:  Social History   Socioeconomic History  . Marital status: Married    Spouse name: Not on file  . Number of children: 1  . Years of education: Not on file  . Highest education level: Not on file  Occupational History  . Occupation: Theatre manager  Tobacco Use  . Smoking status: Former Smoker    Years: 15.00    Types: Cigarettes    Quit date: 01/08/1996    Years since quitting: 23.6  . Smokeless tobacco: Never Used  . Tobacco comment: quit 1998 but is exposed to 2nd hand smoke  Vaping Use  . Vaping Use: Never used  Substance and Sexual Activity  . Alcohol use: No  . Drug use: No  . Sexual activity: Yes    Partners: Male    Birth control/protection: Post-menopausal  Other Topics Concern  . 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 Difficulty of Paying Living Expenses: Not on file  Food Insecurity:   . Worried About [employer] of  Food in the Last Year: Not on file  . Ran Out of Food in the Last Year: Not on file  Transportation Needs: No Transportation Needs  . Lack of Transportation (Medical): No  . Lack of Transportation (Non-Medical): No  Physical Activity:   . Days of Exercise per Week: Not on file  . Minutes of Exercise per Session: Not on file  Stress:   . Feeling of Stress : Not on file  Social Connections: Moderately Integrated  . Frequency of Communication with Friends and Family: More than three times a week  . Frequency of Social Gatherings with Friends and Family: Once a week  . Attends Religious Services: More than 4 times per year  . Active  Member of Clubs or Organizations: No  . Attends Archivist Meetings: Never  . Marital Status: Married  Human resources officer Violence:   . Fear of Current or Ex-Partner: Not on file  . Emotionally Abused: Not on file  . Physically Abused: Not on file  . Sexually Abused: Not on file    FH:  Family History  Problem Relation Age of Onset  . Heart disease Mother 33       chf  . Diabetes Mother   . Thyroid disease Mother   . Cancer Mother 14       female/ skin  . Hypertension Mother   . Liver disease Mother   . Hypertension Father   . Alcohol abuse Father   . Heart disease Father   . Breast cancer Maternal Aunt   . Hyperlipidemia Maternal Aunt   . Heart disease Maternal Aunt   . Hyperlipidemia Brother   . Hypertension Brother        x 2  . Diabetes Brother   . Hypothyroidism Brother   . Liver disease Maternal Grandmother   . Diabetes Paternal Grandmother   . Bone cancer Maternal Aunt        and skull  . Breast cancer Sister 19  . Diabetes Sister   . Hypothyroidism Sister   . Hashimoto's thyroiditis Sister   . Breast cancer Niece 85  . Colon cancer Neg Hx   . Colon polyps Neg Hx   . Esophageal cancer Neg Hx   . Rectal cancer Neg Hx   . Stomach cancer Neg Hx     Past Medical History:  Diagnosis Date  . Allergy   . Cancer [facility])    being worked up for [place]  . Colon polyp 12/29/2006  . 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 Hyperlipidemia   . Hypothyroidism   . Low kidney function   . Obesity    BMI 34  . Osteoporosis   . Palpitations   . Vitamin D deficiency     Current Outpatient Medications  Medication Sig Dispense Refill  . amiodarone (PACERONE) 200 MG tablet Take 1 tablet (200 mg total) by mouth daily. 90 tablet 3  . ARMOUR THYROID 90 MG tablet Take 90 mg by mouth every morning.    . digoxin (LANOXIN) 0.125 MG tablet Take 0.5 tablets (0.0625 mg total) by mouth daily. 45 tablet 3  . estradiol (VIVELLE-DOT) 0.075 MG/24HR Place 1 patch onto the skin 2  (two) times a week.     . ivabradine (CORLANOR) 5 MG TABS tablet Take 1 tablet (5 mg total) by mouth 2 (two) times daily with a meal. 180 tablet 3  . losartan (COZAAR) 25 MG tablet Take 0.5 tablets (12.5 mg total) by mouth in the morning and at  bedtime. 30 tablet 0  . Potassium Chloride ER 20 MEQ TBCR Take 1 tablet by mouth daily. 90 tablet 3  . progesterone (PROMETRIUM) 100 MG capsule Take 100 mg by mouth daily.     Marland Kitchen spironolactone (ALDACTONE) 25 MG tablet TAKE 1 TABLET BY MOUTH EVERY DAY 90 tablet 3  . torsemide (DEMADEX) 20 MG tablet Take 20 mg by mouth daily. Taking 0.5 tablet daily     No current facility-administered medications for this encounter.    Vitals:   09/09/19 0958  BP: 126/78  Pulse: 60  SpO2: 98%  Weight: 87.1 kg (192 lb)  Height: 5\' 2"  (1.575 m)   Wt Readings from Last 3 Encounters:  09/09/19 87.1 kg (192 lb)  07/28/19 87.8 kg (193 lb 9.6 oz)  07/08/19 86.6 kg (191 lb)   PHYSICAL EXAM: General:  Well appearing, moderately obese. No respiratory difficulty HEENT: normal Neck: supple. no JVD. Carotids 2+ bilat; no bruits. No lymphadenopathy or thyromegaly appreciated. Cor: PMI nondisplaced. Regular rate & rhythm. No rubs, gallops or murmurs. Lungs: clear Abdomen: soft, nontender, nondistended. No hepatosplenomegaly. No bruits or masses. Good bowel sounds. Extremities: no cyanosis, clubbing, rash, edema Neuro: alert & oriented x 3, cranial nerves grossly intact. moves all 4 extremities w/o difficulty. Affect pleasant.     ECG:  NSR 71 bpm, no ischemic abnormalities   ASSESSMENT & PLAN: 1. 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 RV normal - Echo EF20-25% moderately reduced RV function with severe MR/TR - most likely from R-CHOP - Cath 06/25/19 no CAD. Low output (CI 1.7). Milrinone started 06/25/19 and stopped 06/30/19 - NYNA II. Euvolemic on exam. Wt faily stable.   - Did not tolerate Entresto due to cough -  Continue Losartan 12.5 mg bid (dobut BP will tolerate further titration w/ soft BP at home) - Continue Spironolactone 25 mg daily  - Continue digoxin 0.0625 mg daily (on reduced dose due to elevated dig level previously). Repeat dig level today  - Continue corlanor 5 mg bid (HR is < 70) - will avoid  blocker (not added previously w/ low output, average resting HR 50s-low 60s) - Start SGLT2i, Farxiga 10 mg daily (hgb A1c 5.7) - Continue torsemide 10 mg daily for now (may be able to later reduce frequency w/ Wilder Glade addition, she will monitor wts at home). Will check BNP today to better gauge fluid status  - Check BMP today and again in 7 days  - Continue w/ LifeVest  - Plan to repeat echo in 3-4 weeks - Refer to EP for ICD if EF remains < 35%.   2. VT - Has LifeVest, no shocks  - Continue Amiodarone 200 mg daily (check TSH, HFTs today, needs annual eye exam) - Plan to continue LifeVest until repeat ECHO  - If EF remains < 35%, will refer to EP for ICD    3.Stage IV non-Hodgkin's lymphoma, mantle cell lymphoma - s/p R-CHOP w/ Neulasta - per Dr Earlie Server, conservative mgt for now   F/u in 3-4 weeks w/ provider + repeat echo.   Rendell Thivierge  PA-C 10:10 AM

## 2019-09-11 ENCOUNTER — Encounter: Payer: Self-pay | Admitting: Internal Medicine

## 2019-09-27 DIAGNOSIS — I420 Dilated cardiomyopathy: Secondary | ICD-10-CM | POA: Diagnosis not present

## 2019-09-29 NOTE — Progress Notes (Addendum)
PCP: Dr Melford Aase Primary Cardiologist: Dr Haroldine Laws  Oncology: Dr Julien Nordmann  Reason for Visit:  Heart Failure  HPI: Laurene L Elliottis a 64 y.o.femalewith a hx of palpitations thought to be PVCs, HTN, non hodgkins lymphoma treated w/ R-CHOP.  Admitted 06/2019 to [facility] with increased shortness of breath and abdominal discomfort. ECHO showed severely reduced EF 20-25% w/ mod-severe MR.  RV moderately reduced.  She developed VT and was started on amiodarone and transferred to [facility] for AHF consultation. R/LHC showed normal coronaries, elevated filling pressures w/ low CO c/w cardiogenic shock. CI was 1.7. She was started on milrinone, IV amiodarone and IV Lasix for diuresis. Responded well to therapy and gradually weaned off milrinone. Placed on spiro, losartan, and dig. No BB with low output. Torsemide added for volume control. Was discharged home w/ LifeVest.   She presents to clinic today for f/u for systolic heart failure and for further med titration. She is here w/ her daughter. Over the last several visits, we have added corlanor. She was tried on Entresto but developed cough after initiation. She was switched back to Losartan.       Today she returns for HF follow up. Last visit farxiga was started. Overall feeling fine. Denies SOB/PND/Orthopnea. Able to walk 1 mile 3-4 days a week.  Appetite ok. No fever or chills. Weight at home has been stable. No issues with Life Vest. Taking all medications.  ECHO 06/2019 EF 20-25%  moderately reduced RV function with severe MR/TR  Cath 6/18 with normal cors. Low output. Ao = 109/71 (81) LV =  99/23 RA =  15 RV =  43/18 PA =  35/14 (25) PCW = 22 Fick cardiac output/index = 3.3/1.7 PVR = < 1.0 WU Ao sat = 100% PA sat = 49%, 53% Assessment: 1. Normal coronary arteries 2. Severe NICM EF 20% 3. Elevated filling pressures and low CO c/w cardiogenic shock 4. Brief run of VT   ROS: All systems negative except as listed in HPI, PMH and Problem  List.  SH:  Social History   Socioeconomic History  . Marital status: Married    Spouse name: Not on file  . Number of children: 1  . Years of education: Not on file  . Highest education level: Not on file  Occupational History  . Occupation: Theatre manager  Tobacco Use  . Smoking status: Former Smoker    Years: 15.00    Types: Cigarettes    Quit date: 01/08/1996    Years since quitting: 23.7  . Smokeless tobacco: Never Used  . Tobacco comment: quit 1998 but is exposed to 2nd hand smoke  Vaping Use  . Vaping Use: Never used  Substance and Sexual Activity  . Alcohol use: No  . Drug use: No  . Sexual activity: Yes    Partners: Male    Birth control/protection: Post-menopausal  Other Topics Concern  . 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 Difficulty of Paying Living Expenses: Not on file  Food Insecurity:   . Worried About Char[employer] fundraiser in the Last Year: Not on file  . Ran Out of Food in the Last Year: Not on file  Transportation Needs: No Transportation Needs  . Lack of Transportation (Medical): No  . Lack of Transportation (Non-Medical): No  Physical Activity:   . Days of  Exercise per Week: Not on file  . Minutes of Exercise per Session: Not on file  Stress:   . Feeling of Stress : Not on file  Social Connections: Moderately Integrated  . Frequency of Communication with Friends and Family: More than three times a week  . Frequency of Social Gatherings with Friends and Family: Once a week  . Attends Religious Services: More than 4 times per year  . Active Member of Clubs or Organizations: No  . Attends Archivist Meetings: Never  . Marital Status: Married  Human resources officer Violence:   . Fear of Current or Ex-Partner: Not on file  . Emotionally Abused: Not on file  . Physically Abused: Not on file   . Sexually Abused: Not on file    FH:  Family History  Problem Relation Age of Onset  . Heart disease Mother 57       chf  . Diabetes Mother   . Thyroid disease Mother   . Cancer Mother 70       female/ skin  . Hypertension Mother   . Liver disease Mother   . Hypertension Father   . Alcohol abuse Father   . Heart disease Father   . Breast cancer Maternal Aunt   . Hyperlipidemia Maternal Aunt   . Heart disease Maternal Aunt   . Hyperlipidemia Brother   . Hypertension Brother        x 2  . Diabetes Brother   . Hypothyroidism Brother   . Liver disease Maternal Grandmother   . Diabetes Paternal Grandmother   . Bone cancer Maternal Aunt        and skull  . Breast cancer Sister 18  . Diabetes Sister   . Hypothyroidism Sister   . Hashimoto's thyroiditis Sister   . Breast cancer Niece 76  . Colon cancer Neg Hx   . Colon polyps Neg Hx   . Esophageal cancer Neg Hx   . Rectal cancer Neg Hx   . Stomach cancer Neg Hx     Past Medical History:  Diagnosis Date  . Allergy   . Cancer [facility])    being worked up for [place]  . Colon polyp 12/29/2006  . 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 Hyperlipidemia   . Hypothyroidism   . Low kidney function   . Obesity    BMI 34  . Osteoporosis   . Palpitations   . Vitamin D deficiency     Current Outpatient Medications  Medication Sig Dispense Refill  . amiodarone (PACERONE) 200 MG tablet Take 1 tablet (200 mg total) by mouth daily. 90 tablet 3  . ARMOUR THYROID 90 MG tablet Take 90 mg by mouth every morning.    . dapagliflozin propanediol (FARXIGA) 10 MG TABS tablet Take 1 tablet (10 mg total) by mouth daily before breakfast. 30 tablet 11  . digoxin (LANOXIN) 0.125 MG tablet Take 0.5 tablets (0.0625 mg total) by mouth daily. 45 tablet 3  . estradiol (VIVELLE-DOT) 0.075 MG/24HR Place 1 patch onto the skin 2 (two) times a week.     . ivabradine (CORLANOR) 5 MG TABS tablet Take 1 tablet (5 mg total) by mouth 2 (two) times daily with a meal.  180 tablet 3  . losartan (COZAAR) 25 MG tablet Take 0.5 tablets (12.5 mg total) by mouth in the morning and at bedtime. 30 tablet 0  . Potassium Chloride ER 20 MEQ TBCR Take 1 tablet by mouth daily. 90 tablet 3  . spironolactone (ALDACTONE) 25  MG tablet TAKE 1 TABLET BY MOUTH EVERY DAY 90 tablet 3  . torsemide (DEMADEX) 20 MG tablet Take 20 mg by mouth daily. Taking 0.5 tablet daily     No current facility-administered medications for this encounter.   Facility-Administered Medications Ordered in Other Encounters  Medication Dose Route Frequency Provider Last Rate Last Admin  . perflutren lipid microspheres (DEFINITY) IV suspension  1-10 mL Intravenous PRN Lyda Jester M, PA-C   2 mL at 09/30/19 1048    Vitals:   09/30/19 1050  BP: 105/80  Pulse: 69  SpO2: 97%  Weight: 86.2 kg (190 lb)   Wt Readings from Last 3 Encounters:  09/30/19 86.2 kg (190 lb)  09/09/19 87.1 kg (192 lb)  07/28/19 87.8 kg (193 lb 9.6 oz)   PHYSICAL EXAM: General:  Well appearing. No resp difficulty. Life Vest on.  HEENT: normal Neck: supple. no JVD. Carotids 2+ bilat; no bruits. No lymphadenopathy or thryomegaly appreciated. Cor: PMI nondisplaced. Regular rate & rhythm. No rubs, gallops or murmurs. Lungs: clear Abdomen: soft, nontender, nondistended. No hepatosplenomegaly. No bruits or masses. Good bowel sounds. Extremities: no cyanosis, clubbing, rash, edema Neuro: alert & orientedx3, cranial nerves grossly intact. moves all 4 extremities w/o difficulty. Affect pleasant    ASSESSMENT & PLAN: 1. 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 RV normal - Echo EF20-25% moderately reduced RV function with severe MR/TR - most likely from R-CHOP - Cath 06/25/19 no CAD. Low output (CI 1.7). Milrinone started 06/25/19 and stopped 06/30/19 -Todays ECHO was discussed and reviewed by Dr Vaughan Browner. EF improving 40-45% with Grade II DD. Stop Life Vest.  -NYHA I. Volume  status stable. Continue torsemide 10 mg daily. - Did not tolerate Entresto due to cough.  Continue Losartan 12.5 mg bid - Continue Spironolactone 25 mg daily  - Stop digoxin with EF improvement.  - Cut back corlanor to 2.5 mg twice a day / - Add coreg 3.125 mg twice a day.  -Continue Farxiga 10 mg daily (hgb A1c 5.7) 2. VT - Has LifeVest, no shocks  - Continue Amiodarone 200 mg daily (recent TSH, LFTs stable)  -EF up to 40-45%. Stop Life Vest. Adding coreg today   3.Stage IV non-Hodgkin's lymphoma, mantle cell lymphoma - s/p R-CHOP w/ Neulasta - per Dr Earlie Server, conservative mgt for now  Check BMET today.  Follow up in 4 weeks with APP and 3 months with Dr Haroldine Laws.   Next visit anticipate stopping corlanor and increasing coreg.  Amy Clegg  NP-C  10:52 AM   Patient seen and examined with the above-signed Advanced Practice Provider and/or Housestaff. I personally reviewed laboratory data, imaging studies and relevant notes. I independently examined the patient and formulated the important aspects of the plan. I have edited the note to reflect any of my changes or salient points. I have personally discussed the plan with the patient and/or family.  She continues to improve. NYHA II. Compliant with all meds. Volume status looks good.   Echo reviewed personally today EF 40-45%  General:  Well appearing. No resp difficulty HEENT: normal Neck: supple. no JVD. Carotids 2+ bilat; no bruits. No lymphadenopathy or thryomegaly appreciated. Cor: PMI nondisplaced. Regular rate & rhythm. No rubs, gallops or murmurs. Lungs: clear Abdomen: soft, nontender, nondistended. No hepatosplenomegaly. No bruits or masses. Good bowel sounds. Extremities: no cyanosis, clubbing, rash, edema Neuro: alert & orientedx3, cranial nerves grossly intact. moves all 4 extremities w/o difficulty. Affect pleasant  Much improved.  EF up. Can stop LifeVest. Stop digoxin. Add carvedilol. Check labs.   Glori Bickers, MD  10:45 PM

## 2019-09-30 ENCOUNTER — Ambulatory Visit (HOSPITAL_COMMUNITY)
Admission: RE | Admit: 2019-09-30 | Discharge: 2019-09-30 | Disposition: A | Discharge status: Home / Self Care | Payer: BC Managed Care – PPO | Source: Ambulatory Visit | Attending: Adult Health | Admitting: Adult Health

## 2019-09-30 ENCOUNTER — Other Ambulatory Visit: Payer: Self-pay

## 2019-09-30 ENCOUNTER — Ambulatory Visit (HOSPITAL_BASED_OUTPATIENT_CLINIC_OR_DEPARTMENT_OTHER)
Admission: RE | Admit: 2019-09-30 | Discharge: 2019-09-30 | Disposition: A | Discharge status: Home / Self Care | Payer: BC Managed Care – PPO | Source: Ambulatory Visit | Attending: Adult Health | Admitting: Adult Health

## 2019-09-30 VITALS — BP 105/80 | HR 69 | Wt 190.0 lb

## 2019-09-30 DIAGNOSIS — I5022 Chronic systolic (congestive) heart failure: Secondary | ICD-10-CM | POA: Diagnosis not present

## 2019-09-30 DIAGNOSIS — I110 Hypertensive heart disease with heart failure: Secondary | ICD-10-CM | POA: Insufficient documentation

## 2019-09-30 DIAGNOSIS — E669 Obesity, unspecified: Secondary | ICD-10-CM | POA: Diagnosis not present

## 2019-09-30 DIAGNOSIS — Z79890 Hormone replacement therapy (postmenopausal): Secondary | ICD-10-CM | POA: Diagnosis not present

## 2019-09-30 DIAGNOSIS — I428 Other cardiomyopathies: Secondary | ICD-10-CM | POA: Diagnosis not present

## 2019-09-30 DIAGNOSIS — I5043 Acute on chronic combined systolic (congestive) and diastolic (congestive) heart failure: Secondary | ICD-10-CM

## 2019-09-30 DIAGNOSIS — I472 Ventricular tachycardia, unspecified: Secondary | ICD-10-CM

## 2019-09-30 DIAGNOSIS — Z79899 Other long term (current) drug therapy: Secondary | ICD-10-CM | POA: Diagnosis not present

## 2019-09-30 DIAGNOSIS — M810 Age-related osteoporosis without current pathological fracture: Secondary | ICD-10-CM | POA: Diagnosis not present

## 2019-09-30 DIAGNOSIS — E785 Hyperlipidemia, unspecified: Secondary | ICD-10-CM | POA: Insufficient documentation

## 2019-09-30 DIAGNOSIS — E039 Hypothyroidism, unspecified: Secondary | ICD-10-CM | POA: Insufficient documentation

## 2019-09-30 DIAGNOSIS — Z87891 Personal history of nicotine dependence: Secondary | ICD-10-CM | POA: Insufficient documentation

## 2019-09-30 DIAGNOSIS — Z7984 Long term (current) use of oral hypoglycemic drugs: Secondary | ICD-10-CM | POA: Insufficient documentation

## 2019-09-30 DIAGNOSIS — Z6834 Body mass index (BMI) 34.0-34.9, adult: Secondary | ICD-10-CM | POA: Diagnosis not present

## 2019-09-30 DIAGNOSIS — C8310 Mantle cell lymphoma, unspecified site: Secondary | ICD-10-CM | POA: Insufficient documentation

## 2019-09-30 LAB — ECHOCARDIOGRAM COMPLETE
Area-P 1/2: 3.21 cm2
Calc EF: 37.5 %
S' Lateral: 4.6 cm
Single Plane A2C EF: 36.8 %
Single Plane A4C EF: 39.8 %

## 2019-09-30 LAB — BASIC METABOLIC PANEL
Anion gap: 11 (ref 5–15)
BUN: 14 mg/dL (ref 8–23)
CO2: 25 mmol/L (ref 22–32)
Calcium: 9.5 mg/dL (ref 8.9–10.3)
Chloride: 102 mmol/L (ref 98–111)
Creatinine, Ser: 1.03 mg/dL — ABNORMAL HIGH (ref 0.44–1.00)
GFR calc Af Amer: >60 mL/min (ref >60)
GFR calc non Af Amer: 58 mL/min — ABNORMAL LOW (ref >60)
Glucose, Bld: 100 mg/dL — ABNORMAL HIGH (ref 70–99)
Potassium: 4.1 mmol/L (ref 3.5–5.1)
Sodium: 138 mmol/L (ref 135–145)

## 2019-09-30 MED ORDER — CARVEDILOL 3.125 MG PO TABS: 3.1250 mg | ORAL_TABLET | Freq: Two times a day (BID) | ORAL | 11 refills | Status: DC

## 2019-09-30 MED ORDER — PERFLUTREN LIPID MICROSPHERE
1.0000 mL | INTRAVENOUS | Status: DC | PRN
  Administered 2019-09-30: 2 mL via INTRAVENOUS
  Filled 2019-09-30: qty 10

## 2019-09-30 MED ORDER — IVABRADINE HCL 5 MG PO TABS
2.5000 mg | ORAL_TABLET | Freq: Two times a day (BID) | ORAL | 3 refills | Status: DC
Start: 2019-09-30 — End: 2019-11-04

## 2019-09-30 NOTE — Patient Instructions (Addendum)
STOP Digoxin  DECREASE Corlanor to 2.5 mg, one half tab twice daily START Coreg 3.125 mg, one tab twice daily  Labs today We will only contact you if something comes back abnormal or we need to make some changes. Otherwise no news is good news!  Discontinue LifeVest- an order was called to the company and they will be in contact to arrange pick up/mail in.  Your physician recommends that you schedule a follow-up appointment in: 4 weeks with Natalie Clegg,Natalie Baker  Your physician recommends that you schedule a follow-up appointment in: 3 months with Natalie Baker  If you have any questions or concerns before your next appointment please send [place] a message through [place] or call our office at [phone].    TO LEAVE A MESSAGE FOR THE NURSE SELECT OPTION 2, PLEASE LEAVE A MESSAGE INCLUDING: . YOUR NAME . DATE OF BIRTH . CALL BACK NUMBER . REASON FOR CALL**this is important as we prioritize the call backs  YOU WILL RECEIVE A CALL BACK THE SAME DAY AS LONG AS YOU CALL BEFORE 4:00 PM

## 2019-09-30 NOTE — Progress Notes (Signed)
  Echocardiogram 2D Echocardiogram has been performed.  Natalie Baker 09/30/2019, 10:50 AM

## 2019-10-03 NOTE — Addendum Note (Signed)
Encounter addended by: Jolaine Artist, MD on: 10/03/2019 10:46 PM  Actions taken: Clinical Note Signed, Level of Service modified, Visit diagnoses modified

## 2019-10-18 ENCOUNTER — Inpatient Hospital Stay: Payer: BC Managed Care – PPO | Attending: Physician Assistant

## 2019-10-18 ENCOUNTER — Other Ambulatory Visit: Payer: Self-pay

## 2019-10-18 VITALS — BP 110/50 | HR 81 | Resp 16

## 2019-10-18 DIAGNOSIS — Z95828 Presence of other vascular implants and grafts: Secondary | ICD-10-CM

## 2019-10-18 MED ORDER — HEPARIN SOD (PORK) LOCK FLUSH 100 UNIT/ML IV SOLN
500.0000 [IU] | Freq: Once | INTRAVENOUS | Status: DC | PRN
  Filled 2019-10-18: qty 5

## 2019-10-18 MED ORDER — SODIUM CHLORIDE 0.9% FLUSH
10.0000 mL | INTRAVENOUS | Status: DC | PRN
  Filled 2019-10-18: qty 10

## 2019-10-18 NOTE — Patient Instructions (Signed)

## 2019-10-22 ENCOUNTER — Other Ambulatory Visit (HOSPITAL_COMMUNITY): Payer: Self-pay | Admitting: Cardiology

## 2019-10-23 ENCOUNTER — Other Ambulatory Visit (HOSPITAL_COMMUNITY): Payer: Self-pay | Admitting: Adult Health

## 2019-10-26 ENCOUNTER — Other Ambulatory Visit (HOSPITAL_COMMUNITY): Payer: Self-pay | Admitting: Cardiology

## 2019-10-26 NOTE — Telephone Encounter (Signed)
Rx has been sent to the pharmacy electronically. ° °

## 2019-11-04 ENCOUNTER — Encounter (HOSPITAL_COMMUNITY): Payer: Self-pay

## 2019-11-04 ENCOUNTER — Other Ambulatory Visit: Payer: Self-pay

## 2019-11-04 ENCOUNTER — Ambulatory Visit (HOSPITAL_COMMUNITY)
Admission: RE | Admit: 2019-11-04 | Discharge: 2019-11-04 | Disposition: A | Discharge status: Home / Self Care | Payer: BC Managed Care – PPO | Source: Ambulatory Visit | Attending: Cardiology | Admitting: Cardiology

## 2019-11-04 ENCOUNTER — Telehealth (HOSPITAL_COMMUNITY): Payer: Self-pay | Admitting: Cardiology

## 2019-11-04 VITALS — BP 108/74 | HR 100 | Wt 189.4 lb

## 2019-11-04 DIAGNOSIS — I110 Hypertensive heart disease with heart failure: Secondary | ICD-10-CM | POA: Diagnosis not present

## 2019-11-04 DIAGNOSIS — Z87891 Personal history of nicotine dependence: Secondary | ICD-10-CM | POA: Insufficient documentation

## 2019-11-04 DIAGNOSIS — I5022 Chronic systolic (congestive) heart failure: Secondary | ICD-10-CM | POA: Diagnosis not present

## 2019-11-04 DIAGNOSIS — Z7984 Long term (current) use of oral hypoglycemic drugs: Secondary | ICD-10-CM | POA: Diagnosis not present

## 2019-11-04 DIAGNOSIS — Z7722 Contact with and (suspected) exposure to environmental tobacco smoke (acute) (chronic): Secondary | ICD-10-CM | POA: Diagnosis not present

## 2019-11-04 DIAGNOSIS — Z8249 Family history of ischemic heart disease and other diseases of the circulatory system: Secondary | ICD-10-CM | POA: Diagnosis not present

## 2019-11-04 DIAGNOSIS — Z79899 Other long term (current) drug therapy: Secondary | ICD-10-CM | POA: Insufficient documentation

## 2019-11-04 DIAGNOSIS — Z8679 Personal history of other diseases of the circulatory system: Secondary | ICD-10-CM | POA: Insufficient documentation

## 2019-11-04 DIAGNOSIS — C8310 Mantle cell lymphoma, unspecified site: Secondary | ICD-10-CM | POA: Insufficient documentation

## 2019-11-04 DIAGNOSIS — I428 Other cardiomyopathies: Secondary | ICD-10-CM | POA: Insufficient documentation

## 2019-11-04 LAB — BASIC METABOLIC PANEL
Anion gap: 11 (ref 5–15)
BUN: 16 mg/dL (ref 8–23)
CO2: 28 mmol/L (ref 22–32)
Calcium: 9.8 mg/dL (ref 8.9–10.3)
Chloride: 99 mmol/L (ref 98–111)
Creatinine, Ser: 1.28 mg/dL — ABNORMAL HIGH (ref 0.44–1.00)
GFR, Estimated: 47 mL/min — ABNORMAL LOW (ref >60)
Glucose, Bld: 100 mg/dL — ABNORMAL HIGH (ref 70–99)
Potassium: 4.4 mmol/L (ref 3.5–5.1)
Sodium: 138 mmol/L (ref 135–145)

## 2019-11-04 MED ORDER — CARVEDILOL 6.25 MG PO TABS: 6.2500 mg | ORAL_TABLET | Freq: Two times a day (BID) | ORAL | 11 refills | Status: DC

## 2019-11-04 MED ORDER — TORSEMIDE 20 MG PO TABS
20.0000 mg | ORAL_TABLET | ORAL | 1 refills | Status: DC
Start: 2019-11-04 — End: 2019-12-30

## 2019-11-04 MED ORDER — AMIODARONE HCL 100 MG PO TABS
100.0000 mg | ORAL_TABLET | Freq: Every day | ORAL | 5 refills | Status: DC
Start: 2019-11-04 — End: 2020-05-04

## 2019-11-04 MED ORDER — POTASSIUM CHLORIDE ER 20 MEQ PO TBCR: 1.0000 | EXTENDED_RELEASE_TABLET | ORAL | 3 refills | Status: DC

## 2019-11-04 NOTE — Progress Notes (Addendum)
PCP: Dr Melford Aase Primary Cardiologist: Dr Haroldine Laws  Oncology: Dr Julien Nordmann  Reason for Visit:  Chronic Systolic Heart Failure  HPI: Natalie L Elliottis a 64 y.o.femalewith a hx of palpitations thought to be PVCs, HTN, non hodgkins lymphoma treated w/ R-CHOP.  Admitted 06/2019 to [facility] with increased shortness of breath and abdominal discomfort. ECHO showed severely reduced EF 20-25% w/ mod-severe MR.  RV moderately reduced.  She developed VT and was started on amiodarone and transferred to [facility] for AHF consultation. R/LHC showed normal coronaries, elevated filling pressures w/ low CO c/w cardiogenic shock. CI was 1.7. She was started on milrinone, IV amiodarone and IV Lasix for diuresis. Responded well to therapy and gradually weaned off milrinone. Placed on spiro, losartan, and dig. No BB with low output. Torsemide added for volume control. Was discharged home w/ LifeVest.   She has had gradual titration of her HF meds. She did not tolerate Entresto due to cough. Switched back to losartan.   Had repeat Echo  9/21. EF improved to 45%. LifeVest and digoxin discontinued. Coreg added, Corlanor reduced down to 2.5 mg bid.   She presents back for f/u. Continues to do well. NYHA Class II. Volume status stable. Wt down 2 lb since previous visit. Denies palpitations. Compliant w/ meds. No side effects. EKG shows NSR. HR 83 bpm.    ECHO 06/2019 EF 20-25%  moderately reduced RV function with severe MR/TR ECHO 9/21: EF 45%, RV normal   Cath 6/18 with normal cors. Low output. Ao = 109/71 (81) LV =  99/23 RA =  15 RV =  43/18 PA =  35/14 (25) PCW = 22 Fick cardiac output/index = 3.3/1.7 PVR = < 1.0 WU Ao sat = 100% PA sat = 49%, 53% Assessment: 1. Normal coronary arteries 2. Severe NICM EF 20% 3. Elevated filling pressures and low CO c/w cardiogenic shock 4. Brief run of VT   ROS: All systems negative except as listed in HPI, PMH and Problem List.  SH:  Social History   Socioeconomic  History  . Marital status: Married    Spouse name: Not on file  . Number of children: 1  . Years of education: Not on file  . Highest education level: Not on file  Occupational History  . Occupation: Theatre manager  Tobacco Use  . Smoking status: Former Smoker    Years: 15.00    Types: Cigarettes    Quit date: 01/08/1996    Years since quitting: 23.8  . Smokeless tobacco: Never Used  . Tobacco comment: quit 1998 but is exposed to 2nd hand smoke  Vaping Use  . Vaping Use: Never used  Substance and Sexual Activity  . Alcohol use: No  . Drug use: No  . Sexual activity: Yes    Partners: Male    Birth control/protection: Post-menopausal  Other Topics Concern  . 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 Difficulty of Paying Living Expenses: Not on file  Food Insecurity:   . Worried About Charity fundraiser in the Last Year: Not on file  . Ran Out of Food in the Last Year: Not on file  Transportation Needs: No Transportation Needs  . Lack of Transportation (Medical): No  . Lack of Transportation (Non-Medical): No  Physical Activity:   . Days of Exercise per Week: Not on file  .  Minutes of Exercise per Session: Not on file  Stress:   . Feeling of Stress : Not on file  Social Connections: Moderately Integrated  . Frequency of Communication with Friends and Family: More than three times a week  . Frequency of Social Gatherings with Friends and Family: Once a week  . Attends Religious Services: More than 4 times per year  . Active Member of Clubs or Organizations: No  . Attends Archivist Meetings: Never  . Marital Status: Married  Human resources officer Violence:   . Fear of Current or Ex-Partner: Not on file  . Emotionally Abused: Not on file  . Physically Abused: Not on file  . Sexually Abused: Not on file    FH:    Family History  Problem Relation Age of Onset  . Heart disease Mother 56       chf  . Diabetes Mother   . Thyroid disease Mother   . Cancer Mother 36       female/ skin  . Hypertension Mother   . Liver disease Mother   . Hypertension Father   . Alcohol abuse Father   . Heart disease Father   . Breast cancer Maternal Aunt   . Hyperlipidemia Maternal Aunt   . Heart disease Maternal Aunt   . Hyperlipidemia Brother   . Hypertension Brother        x 2  . Diabetes Brother   . Hypothyroidism Brother   . Liver disease Maternal Grandmother   . Diabetes Paternal Grandmother   . Bone cancer Maternal Aunt        and skull  . Breast cancer Sister 84  . Diabetes Sister   . Hypothyroidism Sister   . Hashimoto's thyroiditis Sister   . Breast cancer Niece 64  . Colon cancer Neg Hx   . Colon polyps Neg Hx   . Esophageal cancer Neg Hx   . Rectal cancer Neg Hx   . Stomach cancer Neg Hx     Past Medical History:  Diagnosis Date  . Allergy   . Cancer [facility])    being worked up for [place]  . Colon polyp 12/29/2006  . 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 Hyperlipidemia   . Hypothyroidism   . Low kidney function   . Obesity    BMI 34  . Osteoporosis   . Palpitations   . Vitamin D deficiency     Current Outpatient Medications  Medication Sig Dispense Refill  . amiodarone (PACERONE) 200 MG tablet Take 1 tablet (200 mg total) by mouth daily. 90 tablet 3  . ARMOUR THYROID 90 MG tablet Take 90 mg by mouth every morning.    . carvedilol (COREG) 3.125 MG tablet Take 1 tablet (3.125 mg total) by mouth 2 (two) times daily. 60 tablet 11  . dapagliflozin propanediol (FARXIGA) 10 MG TABS tablet Take 1 tablet (10 mg total) by mouth daily before breakfast. 30 tablet 11  . estradiol (VIVELLE-DOT) 0.075 MG/24HR Place 1 patch onto the skin 2 (two) times a week.     . ivabradine (CORLANOR) 5 MG TABS tablet Take 0.5 tablets (2.5 mg total) by mouth 2 (two) times daily with a meal. 60 tablet 3  . losartan  (COZAAR) 25 MG tablet TAKE 1/2 TABLET (12.5 MG TOTAL) BY MOUTH IN THE MORNING AND AT BEDTIME. 30 tablet 6  . Potassium Chloride ER 20 MEQ TBCR Take 1 tablet by mouth daily. 90 tablet 3  . spironolactone (ALDACTONE) 25 MG tablet TAKE 1  TABLET BY MOUTH EVERY DAY 90 tablet 3  . torsemide (DEMADEX) 20 MG tablet TAKE 1 TABLET BY MOUTH EVERY DAY 90 tablet 1   No current facility-administered medications for this encounter.    Vitals:   11/04/19 0938  BP: 108/74  Pulse: 100  SpO2: 97%  Weight: 85.9 kg (189 lb 6.4 oz)   Wt Readings from Last 3 Encounters:  11/04/19 85.9 kg (189 lb 6.4 oz)  09/30/19 86.2 kg (190 lb)  09/09/19 87.1 kg (192 lb)   PHYSICAL EXAM: General:  Well appearing. No respiratory difficulty HEENT: normal Neck: supple. no JVD. Carotids 2+ bilat; no bruits. No lymphadenopathy or thyromegaly appreciated. Cor: PMI nondisplaced. Regular rate & rhythm. No rubs, gallops or murmurs. Lungs: clear Abdomen: soft, nontender, nondistended. No hepatosplenomegaly. No bruits or masses. Good bowel sounds. Extremities: no cyanosis, clubbing, rash, edema Neuro: alert & oriented x 3, cranial nerves grossly intact. moves all 4 extremities w/o difficulty. Affect pleasant.   ASSESSMENT & PLAN: 1. Chronic Systolic HF  - Echo 2/64 EF 55-60%. RV normal - Echo EF20-25% moderately reduced RV function with severe MR/TR - most likely from R-CHOP - Cath 06/25/19 no CAD. Low output (CI 1.7). Milrinone started 06/25/19 and stopped 06/30/19 - EF improving w/ GDMT - Echo 9/21 showed EF up to 45%. RV ok  - Now out of range for ICD - NYHA II. Volume status ok. - Continue torsemide 20 mg daily. - Did not tolerate Entresto due to cough.  Continue Losartan 12.5 mg bid - Continue Spironolactone 25 mg daily   - Increase Coreg to 6.25 mg bid (she will monitor BP closely at home) - Stop corlanor   - Continue Farxiga 10 mg daily  - Check BMP today   2. H/o VT - denies palpitations - EF improved on  recent echo, 45%>>LifeVest discontinued  - Continue  blocker, increase Coreg to 6.25 mg bid - Reduce amio to 100 mg daily  - Check BMP today   3.Stage IV non-Hodgkin's lymphoma, mantle cell lymphoma - s/p R-CHOP w/ Neulasta - per Dr Earlie Server, conservative mgt for now  Keep f/u w/ Dr. Haroldine Laws in Dec.   Lyda Jester, PA-C  9:44 AM

## 2019-11-04 NOTE — Telephone Encounter (Signed)
-----   Message from Consuelo Pandy, [place] sent at 11/04/2019 11:19 AM EDT ----- Slight bump in SCr. Her wt has been stable. Change Torsemide and KCl to every other day. Monitor wt closely at home. If her wt starts to trend up, she can increase back to taking both torsemide and KCl to daily.

## 2019-11-04 NOTE — Telephone Encounter (Signed)
Pt aware and voiced understanding 

## 2019-11-04 NOTE — Patient Instructions (Signed)
STOP Corlanor   INCREASE Coreg to 6.25mg  (1 tab) twice a day   DECREASE Amiodarone to 100mg  (1 tab) daily   Your physician recommends that you schedule a follow-up appointment in: Keep your next appointment with Dr Haroldine Laws   Please call office at [phone] option [phone] if you have any questions or concerns.   At the [facility], you and your health needs are our priority. As part of our continuing mission to provide you with exceptional heart care, we have created designated Provider Care Teams. These Care Teams include your primary Cardiologist (physician) and Advanced Practice Providers (APPs- Physician Assistants and Nurse Practitioners) who all work together to provide you with the care you need, when you need it.   You may see any of the following providers on your designated Care Team at your next follow up: Marland Kitchen Dr Glori Bickers . Dr Loralie Champagne . Darrick Grinder, NP . Lyda Jester, PA . Audry Riles, PharmD   Please be sure to bring in all your medications bottles to every appointment.

## 2019-11-15 ENCOUNTER — Encounter: Payer: Self-pay | Admitting: Internal Medicine

## 2019-11-18 DIAGNOSIS — Z780 Asymptomatic menopausal state: Secondary | ICD-10-CM | POA: Diagnosis not present

## 2019-11-18 DIAGNOSIS — Z01419 Encounter for gynecological examination (general) (routine) without abnormal findings: Secondary | ICD-10-CM | POA: Diagnosis not present

## 2019-11-18 DIAGNOSIS — Z6834 Body mass index (BMI) 34.0-34.9, adult: Secondary | ICD-10-CM | POA: Diagnosis not present

## 2019-11-18 DIAGNOSIS — Z1231 Encounter for screening mammogram for malignant neoplasm of breast: Secondary | ICD-10-CM | POA: Diagnosis not present

## 2019-11-18 DIAGNOSIS — Z130 Encounter for screening for diseases of the blood and blood-forming organs and certain disorders involving the immune mechanism: Secondary | ICD-10-CM | POA: Diagnosis not present

## 2019-11-24 ENCOUNTER — Other Ambulatory Visit: Payer: Self-pay | Admitting: Gynecology

## 2019-11-24 DIAGNOSIS — R928 Other abnormal and inconclusive findings on diagnostic imaging of breast: Secondary | ICD-10-CM

## 2019-11-25 ENCOUNTER — Other Ambulatory Visit: Payer: Self-pay | Admitting: Gynecology

## 2019-11-29 ENCOUNTER — Other Ambulatory Visit: Payer: Self-pay

## 2019-11-29 ENCOUNTER — Inpatient Hospital Stay: Payer: BC Managed Care – PPO | Attending: Physician Assistant

## 2019-11-29 DIAGNOSIS — Z452 Encounter for adjustment and management of vascular access device: Secondary | ICD-10-CM | POA: Insufficient documentation

## 2019-11-29 DIAGNOSIS — Z95828 Presence of other vascular implants and grafts: Secondary | ICD-10-CM

## 2019-11-29 DIAGNOSIS — C8318 Mantle cell lymphoma, lymph nodes of multiple sites: Secondary | ICD-10-CM | POA: Diagnosis not present

## 2019-11-29 MED ORDER — SODIUM CHLORIDE 0.9% FLUSH
10.0000 mL | INTRAVENOUS | Status: DC | PRN
  Administered 2019-11-29: 10 mL
  Filled 2019-11-29: qty 10

## 2019-11-29 MED ORDER — HEPARIN SOD (PORK) LOCK FLUSH 100 UNIT/ML IV SOLN
500.0000 [IU] | Freq: Once | INTRAVENOUS | Status: AC | PRN
  Administered 2019-11-29: 500 [IU]
  Filled 2019-11-29: qty 5

## 2019-12-08 ENCOUNTER — Ambulatory Visit
Admission: RE | Admit: 2019-12-08 | Discharge: 2019-12-08 | Disposition: A | Discharge status: Home / Self Care | Payer: BC Managed Care – PPO | Source: Ambulatory Visit | Attending: Gynecology | Admitting: Gynecology

## 2019-12-08 ENCOUNTER — Other Ambulatory Visit: Payer: Self-pay

## 2019-12-08 DIAGNOSIS — N6489 Other specified disorders of breast: Secondary | ICD-10-CM | POA: Diagnosis not present

## 2019-12-08 DIAGNOSIS — R928 Other abnormal and inconclusive findings on diagnostic imaging of breast: Secondary | ICD-10-CM

## 2019-12-10 ENCOUNTER — Ambulatory Visit (HOSPITAL_COMMUNITY)
Admission: RE | Admit: 2019-12-10 | Discharge: 2019-12-10 | Disposition: A | Discharge status: Home / Self Care | Payer: BC Managed Care – PPO | Source: Ambulatory Visit | Attending: Internal Medicine | Admitting: Internal Medicine

## 2019-12-10 ENCOUNTER — Encounter (HOSPITAL_COMMUNITY): Payer: Self-pay

## 2019-12-10 ENCOUNTER — Other Ambulatory Visit: Payer: Self-pay

## 2019-12-10 ENCOUNTER — Inpatient Hospital Stay: Payer: BC Managed Care – PPO | Attending: Physician Assistant

## 2019-12-10 ENCOUNTER — Inpatient Hospital Stay: Payer: BC Managed Care – PPO

## 2019-12-10 DIAGNOSIS — C8313 Mantle cell lymphoma, intra-abdominal lymph nodes: Secondary | ICD-10-CM

## 2019-12-10 DIAGNOSIS — C8318 Mantle cell lymphoma, lymph nodes of multiple sites: Secondary | ICD-10-CM | POA: Diagnosis not present

## 2019-12-10 DIAGNOSIS — Z95828 Presence of other vascular implants and grafts: Secondary | ICD-10-CM

## 2019-12-10 DIAGNOSIS — C8310 Mantle cell lymphoma, unspecified site: Secondary | ICD-10-CM

## 2019-12-10 LAB — CBC WITH DIFFERENTIAL (CANCER CENTER ONLY)
Abs Immature Granulocytes: 0.04 10*3/uL (ref 0.00–0.07)
Basophils Absolute: 0.1 10*3/uL (ref 0.0–0.1)
Basophils Relative: 1 %
Eosinophils Absolute: 0.1 10*3/uL (ref 0.0–0.5)
Eosinophils Relative: 1 %
HCT: 42.4 % (ref 36.0–46.0)
Hemoglobin: 13.7 g/dL (ref 12.0–15.0)
Immature Granulocytes: 1 %
Lymphocytes Relative: 8 %
Lymphs Abs: 0.6 10*3/uL — ABNORMAL LOW (ref 0.7–4.0)
MCH: 28.1 pg (ref 26.0–34.0)
MCHC: 32.3 g/dL (ref 30.0–36.0)
MCV: 86.9 fL (ref 80.0–100.0)
Monocytes Absolute: 0.6 10*3/uL (ref 0.1–1.0)
Monocytes Relative: 9 %
Neutro Abs: 5.4 10*3/uL (ref 1.7–7.7)
Neutrophils Relative %: 80 %
Platelet Count: 273 10*3/uL (ref 150–400)
RBC: 4.88 MIL/uL (ref 3.87–5.11)
RDW: 15.3 % (ref 11.5–15.5)
WBC Count: 6.8 10*3/uL (ref 4.0–10.5)
nRBC: 0 % (ref 0.0–0.2)

## 2019-12-10 LAB — CMP (CANCER CENTER ONLY)
ALT: 23 U/L (ref 0–44)
AST: 26 U/L (ref 15–41)
Albumin: 3.7 g/dL (ref 3.5–5.0)
Alkaline Phosphatase: 72 U/L (ref 38–126)
Anion gap: 10 (ref 5–15)
BUN: 14 mg/dL (ref 8–23)
CO2: 24 mmol/L (ref 22–32)
Calcium: 9.4 mg/dL (ref 8.9–10.3)
Chloride: 103 mmol/L (ref 98–111)
Creatinine: 0.91 mg/dL (ref 0.44–1.00)
GFR, Estimated: >60 mL/min (ref >60)
Glucose, Bld: 85 mg/dL (ref 70–99)
Potassium: 4.6 mmol/L (ref 3.5–5.1)
Sodium: 137 mmol/L (ref 135–145)
Total Bilirubin: 0.4 mg/dL (ref 0.3–1.2)
Total Protein: 6.8 g/dL (ref 6.5–8.1)

## 2019-12-10 LAB — URIC ACID: Uric Acid, Serum: 4.5 mg/dL (ref 2.5–7.1)

## 2019-12-10 LAB — LACTATE DEHYDROGENASE: LDH: 179 U/L (ref 98–192)

## 2019-12-10 MED ORDER — HEPARIN SOD (PORK) LOCK FLUSH 100 UNIT/ML IV SOLN
500.0000 [IU] | Freq: Once | INTRAVENOUS | Status: AC
  Administered 2019-12-10: 500 [IU] via INTRAVENOUS

## 2019-12-10 MED ORDER — IOHEXOL 300 MG/ML  SOLN
100.0000 mL | Freq: Once | INTRAMUSCULAR | Status: AC | PRN
  Administered 2019-12-10: 100 mL via INTRAVENOUS

## 2019-12-10 MED ORDER — SODIUM CHLORIDE 0.9% FLUSH
10.0000 mL | INTRAVENOUS | Status: DC | PRN
  Administered 2019-12-10: 10 mL
  Filled 2019-12-10: qty 10

## 2019-12-10 MED ORDER — HEPARIN SOD (PORK) LOCK FLUSH 100 UNIT/ML IV SOLN
INTRAVENOUS | Status: AC
  Filled 2019-12-10: qty 5

## 2019-12-10 NOTE — Patient Instructions (Signed)

## 2019-12-13 ENCOUNTER — Telehealth: Payer: Self-pay | Admitting: Internal Medicine

## 2019-12-13 ENCOUNTER — Other Ambulatory Visit: Payer: Self-pay

## 2019-12-13 ENCOUNTER — Inpatient Hospital Stay: Payer: BC Managed Care – PPO | Admitting: Internal Medicine

## 2019-12-13 ENCOUNTER — Encounter: Payer: Self-pay | Admitting: Internal Medicine

## 2019-12-13 VITALS — BP 136/75 | HR 101 | Temp 97.8°F | Resp 18 | Ht 62.0 in | Wt 190.0 lb

## 2019-12-13 DIAGNOSIS — C8318 Mantle cell lymphoma, lymph nodes of multiple sites: Secondary | ICD-10-CM | POA: Diagnosis not present

## 2019-12-13 NOTE — Progress Notes (Signed)
[facility] Telephone:([phone]   Fax:([phone]) [phone]  OFFICE PROGRESS NOTE  [name], MD [address] [place] [address]  DIAGNOSIS: Stage IV non-Hodgkin's lymphoma, mantle cell lymphoma. She presented with left iliac and retroperitoneal lymphadenopathy. She also presented with left axillary lymphadenopathy and bone marrow involvement. She was diagnosed in December 2020.  PRIOR THERAPY: Systemic chemotherapy with R-CHOPwithNeulasta support. First dose on 01/27/2019. Status post 6 cycles.   CURRENT THERAPY: Observation.  INTERVAL HISTORY: Natalie Baker 64 y.o. female returns to the clinic today for 6 months follow-up visit accompanied by her daughter Natalie Baker and her sister-in-law Natalie Baker.  The patient is feeling fine today with no concerning complaints.  She was assured by Dr. Haroldine Laws that her ejection fraction is improving and currently around 45%.  She denied having any current chest pain, shortness of breath, cough or hemoptysis.  She denied having any nausea, vomiting, diarrhea or constipation.  She has no headache or visual changes.  She received her Covid booster shot in the left arm several days ago.  The patient had repeat CT scan of the chest, abdomen pelvis performed recently and she is here for evaluation and discussion of her risk her results.  MEDICAL HISTORY: Past Medical History:  Diagnosis Date  . Allergy   . Cancer [place] [facility])    being worked up for [place]  . Colon polyp 12/29/2006  . 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 Hyperlipidemia   . Hypothyroidism   . Low kidney function   . Obesity    BMI 34  . Osteoporosis   . Palpitations   . Vitamin D deficiency     ALLERGIES:  is allergic to ppd [tuberculin purified protein derivative].  MEDICATIONS:  Current Outpatient Medications  Medication Sig Dispense Refill  . amiodarone (PACERONE) 100 MG tablet Take 1 tablet (100 mg total) by mouth daily. 30 tablet 5  .  ARMOUR THYROID 90 MG tablet Take 90 mg by mouth every morning.    . carvedilol (COREG) 6.25 MG tablet Take 1 tablet (6.25 mg total) by mouth 2 (two) times daily. 60 tablet 11  . dapagliflozin propanediol (FARXIGA) 10 MG TABS tablet Take 1 tablet (10 mg total) by mouth daily before breakfast. 30 tablet 11  . estradiol (VIVELLE-DOT) 0.075 MG/24HR Place 1 patch onto the skin 2 (two) times a week.     . losartan (COZAAR) 25 MG tablet TAKE 1/2 TABLET (12.5 MG TOTAL) BY MOUTH IN THE MORNING AND AT BEDTIME. 30 tablet 6  . Potassium Chloride ER 20 MEQ TBCR Take 1 tablet by mouth every other day. 90 tablet 3  . spironolactone (ALDACTONE) 25 MG tablet TAKE 1 TABLET BY MOUTH EVERY DAY 90 tablet 3  . torsemide (DEMADEX) 20 MG tablet Take 1 tablet (20 mg total) by mouth every other day. 90 tablet 1   No current facility-administered medications for this visit.    SURGICAL HISTORY:  Past Surgical History:  Procedure Laterality Date  . ABDOMINAL HYSTERECTOMY  1997  . CESAREAN SECTION  1983  . COLONOSCOPY W/ BIOPSIES  12/29/2006  . IR IMAGING GUIDED PORT INSERTION  01/15/2019  . LAPAROSCOPY     1991 for emdometriosis  . RIGHT/LEFT HEART CATH AND CORONARY ANGIOGRAPHY N/A 06/25/2019   Procedure: RIGHT/LEFT HEART CATH AND CORONARY ANGIOGRAPHY;  Surgeon: Jolaine Artist, MD;  Location: [facility];  Service: Cardiovascular;  Laterality: N/A;  . UPPER GASTROINTESTINAL ENDOSCOPY      REVIEW OF  SYSTEMS:  A comprehensive review of systems was negative.   PHYSICAL EXAMINATION: General appearance: alert, cooperative and no distress Head: Normocephalic, without obvious abnormality, atraumatic Neck: no adenopathy, no JVD, supple, symmetrical, trachea midline and thyroid not enlarged, symmetric, no tenderness/mass/nodules Lymph nodes: Cervical, supraclavicular, and axillary nodes normal. Resp: clear to auscultation bilaterally Back: symmetric, no curvature. ROM normal. No CVA tenderness. Cardio: regular  rate and rhythm, S1, S2 normal, no murmur, click, rub or gallop GI: soft, non-tender; bowel sounds normal; no masses,  no organomegaly Extremities: extremities normal, atraumatic, no cyanosis or edema  ECOG PERFORMANCE STATUS: 1 - Symptomatic but completely ambulatory  Blood pressure 136/75, pulse (!) 101, temperature 97.8 F (36.6 C), temperature source Tympanic, resp. rate 18, height 5\' 2"  (1.575 m), weight 190 lb (86.2 kg), SpO2 99 %.  LABORATORY DATA: Lab Results  Component Value Date   WBC 6.8 12/10/2019   HGB 13.7 12/10/2019   HCT 42.4 12/10/2019   MCV 86.9 12/10/2019   PLT 273 12/10/2019      Chemistry      Component Value Date/Time   NA 137 12/10/2019 0940   K 4.6 12/10/2019 0940   CL 103 12/10/2019 0940   CO2 24 12/10/2019 0940   BUN 14 12/10/2019 0940   CREATININE 0.91 12/10/2019 0940   CREATININE 0.74 12/21/2018 1513      Component Value Date/Time   CALCIUM 9.4 12/10/2019 0940   ALKPHOS 72 12/10/2019 0940   AST 26 12/10/2019 0940   ALT 23 12/10/2019 0940   BILITOT 0.4 12/10/2019 0940       RADIOGRAPHIC STUDIES: CT Chest W Contrast  Result Date: 12/11/2019 CLINICAL DATA:  Follow-up mantle cell lymphoma EXAM: CT CHEST, ABDOMEN, AND PELVIS WITH CONTRAST TECHNIQUE: Multidetector CT imaging of the chest, abdomen and pelvis was performed following the standard protocol during bolus administration of intravenous contrast. CONTRAST:  137mL OMNIPAQUE IOHEXOL 300 MG/ML SOLN, additional oral enteric contrast COMPARISON:  PET-CT, 06/10/2019 FINDINGS: CT CHEST FINDINGS Cardiovascular: Right chest port catheter. Scattered aortic atherosclerosis. Normal heart size. No pericardial effusion. Mediastinum/Nodes: No significant change in enlarged left-sided superior mediastinal/lower cervical lymph nodes measuring up to 1.5 x 1.0 cm (series 2, image 7). Slight interval increase in size in enlarged left axillary lymph nodes, index node measuring 1.1 x 0.8 cm, previously 0.9 x 0.6 cm  (series 2, image 13). Thyroid gland, trachea, and esophagus demonstrate no significant findings. Lungs/Pleura: Stable 0.8 x 0.7 cm nodule of the medial segment right middle lobe with some associated consolidation or atelectasis (series 4, image 72). No pleural effusion or pneumothorax. Musculoskeletal: No chest wall mass or suspicious bone lesions identified. CT ABDOMEN PELVIS FINDINGS Hepatobiliary: No solid liver abnormality is seen. Faintly calcified gallstones in the gallbladder. Gallbladder wall thickening, or biliary dilatation. Pancreas: Unremarkable. No pancreatic ductal dilatation or surrounding inflammatory changes. Spleen: Normal in size without significant abnormality. Adrenals/Urinary Tract: Unchanged non nodular thickening of the adrenal glands. Kidneys are normal, without renal calculi, solid lesion, or hydronephrosis. Bladder is unremarkable. Stomach/Bowel: Stomach is within normal limits. Appendix appears normal. No evidence of bowel wall thickening, distention, or inflammatory changes. Vascular/Lymphatic: Aortic atherosclerosis. Interval decrease in size of enlarged left retroperitoneal lymph nodes, largest index node measuring 1.5 x 0.9 cm, previously 2.0 x 1.1 cm (series 2, image 66). No significant interval change in enlarged left iliac lymph nodes, largest index node measuring 1.7 x 1.5 cm (series 2, image 97). Interval decrease in size in enlarged right lower quadrant mesocolon lymph node or  soft tissue nodule, measuring 2.2 x 1.7 cm, previously 2.7 x 1.9 cm (series 2, image 79). Reproductive: Status post hysterectomy. Other: No abdominal wall hernia or abnormality. No abdominopelvic ascites. Musculoskeletal: No acute or significant osseous findings. IMPRESSION: 1. No significant change in enlarged left-sided superior mediastinal/lower cervical lymph nodes. 2. Slight interval increase in size in enlarged left axillary lymph nodes. 3. Interval decrease in size of enlarged left retroperitoneal  lymph nodes. 4. Interval decrease in size in enlarged right lower quadrant mesocolon lymph node or soft tissue nodule. 5. No significant interval change in enlarged left iliac lymph nodes. 6. Constellation of above findings suggests a mixed response to treatment, although overall degree of change is modest. 7. Normal size of the spleen. 8. Stable small nodule of the medial segment right middle lobe with some associated consolidation or atelectasis, not previously FDG avid. 9. Cholelithiasis. Aortic Atherosclerosis (ICD10-I70.0). Electronically Signed   By: Eddie Candle M.D.   On: 12/11/2019 16:52   CT Abdomen Pelvis W Contrast  Result Date: 12/11/2019 CLINICAL DATA:  Follow-up mantle cell lymphoma EXAM: CT CHEST, ABDOMEN, AND PELVIS WITH CONTRAST TECHNIQUE: Multidetector CT imaging of the chest, abdomen and pelvis was performed following the standard protocol during bolus administration of intravenous contrast. CONTRAST:  172mL OMNIPAQUE IOHEXOL 300 MG/ML SOLN, additional oral enteric contrast COMPARISON:  PET-CT, 06/10/2019 FINDINGS: CT CHEST FINDINGS Cardiovascular: Right chest port catheter. Scattered aortic atherosclerosis. Normal heart size. No pericardial effusion. Mediastinum/Nodes: No significant change in enlarged left-sided superior mediastinal/lower cervical lymph nodes measuring up to 1.5 x 1.0 cm (series 2, image 7). Slight interval increase in size in enlarged left axillary lymph nodes, index node measuring 1.1 x 0.8 cm, previously 0.9 x 0.6 cm (series 2, image 13). Thyroid gland, trachea, and esophagus demonstrate no significant findings. Lungs/Pleura: Stable 0.8 x 0.7 cm nodule of the medial segment right middle lobe with some associated consolidation or atelectasis (series 4, image 72). No pleural effusion or pneumothorax. Musculoskeletal: No chest wall mass or suspicious bone lesions identified. CT ABDOMEN PELVIS FINDINGS Hepatobiliary: No solid liver abnormality is seen. Faintly calcified  gallstones in the gallbladder. Gallbladder wall thickening, or biliary dilatation. Pancreas: Unremarkable. No pancreatic ductal dilatation or surrounding inflammatory changes. Spleen: Normal in size without significant abnormality. Adrenals/Urinary Tract: Unchanged non nodular thickening of the adrenal glands. Kidneys are normal, without renal calculi, solid lesion, or hydronephrosis. Bladder is unremarkable. Stomach/Bowel: Stomach is within normal limits. Appendix appears normal. No evidence of bowel wall thickening, distention, or inflammatory changes. Vascular/Lymphatic: Aortic atherosclerosis. Interval decrease in size of enlarged left retroperitoneal lymph nodes, largest index node measuring 1.5 x 0.9 cm, previously 2.0 x 1.1 cm (series 2, image 66). No significant interval change in enlarged left iliac lymph nodes, largest index node measuring 1.7 x 1.5 cm (series 2, image 97). Interval decrease in size in enlarged right lower quadrant mesocolon lymph node or soft tissue nodule, measuring 2.2 x 1.7 cm, previously 2.7 x 1.9 cm (series 2, image 79). Reproductive: Status post hysterectomy. Other: No abdominal wall hernia or abnormality. No abdominopelvic ascites. Musculoskeletal: No acute or significant osseous findings. IMPRESSION: 1. No significant change in enlarged left-sided superior mediastinal/lower cervical lymph nodes. 2. Slight interval increase in size in enlarged left axillary lymph nodes. 3. Interval decrease in size of enlarged left retroperitoneal lymph nodes. 4. Interval decrease in size in enlarged right lower quadrant mesocolon lymph node or soft tissue nodule. 5. No significant interval change in enlarged left iliac lymph nodes. 6. Constellation of  above findings suggests a mixed response to treatment, although overall degree of change is modest. 7. Normal size of the spleen. 8. Stable small nodule of the medial segment right middle lobe with some associated consolidation or atelectasis, not  previously FDG avid. 9. Cholelithiasis. Aortic Atherosclerosis (ICD10-I70.0). Electronically Signed   By: Eddie Candle M.D.   On: 12/11/2019 16:52   US BREAST LTD UNI LEFT INC AXILLA  Result Date: 12/08/2019 CLINICAL DATA:  64 year old female for further evaluation of possible LEFT breast asymmetry on screening mammogram. EXAM: DIGITAL DIAGNOSTIC LEFT MAMMOGRAM WITH CAD AND TOMO ULTRASOUND LEFT BREAST COMPARISON:  Previous exam(s). ACR Breast Density Category b: There are scattered areas of fibroglandular density. FINDINGS: 2D/3D full field and spot compression views of the LEFT breast demonstrate some dispersal of the asymmetry within the posterior LEFT breast on the CC view with the asymmetry much less conspicuous when compared to the screening study finding. Mammographic images were processed with CAD. Targeted ultrasound is performed, showing no sonographic abnormality in the area of the screening study finding. IMPRESSION: 1. Less conspicuous asymmetry within the posterior LEFT breast and without sonographic correlate. This probably represents normal fibroglandular tissue or a benign process. As a precaution, six-month follow-up is recommended to ensure stability. RECOMMENDATION: LEFT diagnostic mammogram with possible LEFT breast ultrasound in 6 months. I have discussed the findings and recommendations with the patient. If applicable, a reminder letter will be sent to the patient regarding the next appointment. BI-RADS CATEGORY  3: Probably benign. Electronically Signed   By: Margarette Canada M.D.   On: 12/08/2019 13:55   MM DIAG BREAST TOMO UNI LEFT  Result Date: 12/08/2019 CLINICAL DATA:  64 year old female for further evaluation of possible LEFT breast asymmetry on screening mammogram. EXAM: DIGITAL DIAGNOSTIC LEFT MAMMOGRAM WITH CAD AND TOMO ULTRASOUND LEFT BREAST COMPARISON:  Previous exam(s). ACR Breast Density Category b: There are scattered areas of fibroglandular density. FINDINGS: 2D/3D full field  and spot compression views of the LEFT breast demonstrate some dispersal of the asymmetry within the posterior LEFT breast on the CC view with the asymmetry much less conspicuous when compared to the screening study finding. Mammographic images were processed with CAD. Targeted ultrasound is performed, showing no sonographic abnormality in the area of the screening study finding. IMPRESSION: 1. Less conspicuous asymmetry within the posterior LEFT breast and without sonographic correlate. This probably represents normal fibroglandular tissue or a benign process. As a precaution, six-month follow-up is recommended to ensure stability. RECOMMENDATION: LEFT diagnostic mammogram with possible LEFT breast ultrasound in 6 months. I have discussed the findings and recommendations with the patient. If applicable, a reminder letter will be sent to the patient regarding the next appointment. BI-RADS CATEGORY  3: Probably benign. Electronically Signed   By: Margarette Canada M.D.   On: 12/08/2019 13:55    ASSESSMENT AND PLAN: This is a very pleasant 64 years old white female recently diagnosed with stage IV less aggressive mantle cell lymphoma presented with left iliac and retroperitoneal lymphadenopathy as well as left axillary and bone marrow involvement in December 2020. The patient completed 6 cycles of systemic chemotherapy with R-CHOP.  She tolerated her treatment fairly well with no concerning adverse effects but few months later she developed congestive heart failure with drop in her ejection fraction to the 20-25%.  This has improved over the last few months and currently around 45%.  She is followed by Dr. Haroldine Laws. She had repeat CT scan of the chest, abdomen pelvis performed recently and  she is here for evaluation and discussion of her scan results. Her scan showed no concerning findings for disease progression and slight increase in the left axillary lymph node is likely secondary to her recent Covid booster  injection in the left arm. I discussed the scan results with the patient and her family and recommended for her to continue on observation with repeat CT scan of the chest, abdomen pelvis in 6 months. She was advised to call immediately if she has any concerning symptoms in the interval. The patient voices understanding of current disease status and treatment options and is in agreement with the current care plan.  All questions were answered. The patient knows to call the clinic with any problems, questions or concerns. We can certainly see the patient much sooner if necessary.  Disclaimer: This note was dictated with voice recognition software. Similar sounding words can inadvertently be transcribed and may not be corrected upon review.

## 2019-12-13 NOTE — Telephone Encounter (Signed)
Scheduled appointments per 12/6 los. Spoke to patient who is aware of appointment date and time.  

## 2019-12-30 ENCOUNTER — Encounter (HOSPITAL_COMMUNITY): Payer: Self-pay | Admitting: Internal Medicine

## 2019-12-30 ENCOUNTER — Other Ambulatory Visit: Payer: Self-pay

## 2019-12-30 ENCOUNTER — Ambulatory Visit (HOSPITAL_COMMUNITY)
Admission: RE | Admit: 2019-12-30 | Discharge: 2019-12-30 | Disposition: A | Discharge status: Home / Self Care | Payer: BC Managed Care – PPO | Source: Ambulatory Visit | Attending: Internal Medicine | Admitting: Internal Medicine

## 2019-12-30 VITALS — BP 130/80 | HR 85 | Wt 191.6 lb

## 2019-12-30 DIAGNOSIS — C8316 Mantle cell lymphoma, intrapelvic lymph nodes: Secondary | ICD-10-CM | POA: Diagnosis not present

## 2019-12-30 DIAGNOSIS — I472 Ventricular tachycardia, unspecified: Secondary | ICD-10-CM

## 2019-12-30 DIAGNOSIS — Z87891 Personal history of nicotine dependence: Secondary | ICD-10-CM | POA: Diagnosis not present

## 2019-12-30 DIAGNOSIS — I110 Hypertensive heart disease with heart failure: Secondary | ICD-10-CM | POA: Diagnosis not present

## 2019-12-30 DIAGNOSIS — I5022 Chronic systolic (congestive) heart failure: Secondary | ICD-10-CM | POA: Diagnosis not present

## 2019-12-30 DIAGNOSIS — C8310 Mantle cell lymphoma, unspecified site: Secondary | ICD-10-CM | POA: Insufficient documentation

## 2019-12-30 DIAGNOSIS — C8590 Non-Hodgkin lymphoma, unspecified, unspecified site: Secondary | ICD-10-CM | POA: Insufficient documentation

## 2019-12-30 DIAGNOSIS — E785 Hyperlipidemia, unspecified: Secondary | ICD-10-CM | POA: Diagnosis not present

## 2019-12-30 DIAGNOSIS — I428 Other cardiomyopathies: Secondary | ICD-10-CM | POA: Diagnosis not present

## 2019-12-30 DIAGNOSIS — Z79899 Other long term (current) drug therapy: Secondary | ICD-10-CM | POA: Insufficient documentation

## 2019-12-30 DIAGNOSIS — Z8249 Family history of ischemic heart disease and other diseases of the circulatory system: Secondary | ICD-10-CM | POA: Insufficient documentation

## 2019-12-30 HISTORY — DX: Heart failure, unspecified: I50.9

## 2019-12-30 LAB — COMPREHENSIVE METABOLIC PANEL
ALT: 30 U/L (ref 0–44)
AST: 27 U/L (ref 15–41)
Albumin: 4.3 g/dL (ref 3.5–5.0)
Alkaline Phosphatase: 77 U/L (ref 38–126)
Anion gap: 12 (ref 5–15)
BUN: 16 mg/dL (ref 8–23)
CO2: 26 mmol/L (ref 22–32)
Calcium: 9.9 mg/dL (ref 8.9–10.3)
Chloride: 98 mmol/L (ref 98–111)
Creatinine, Ser: 1.03 mg/dL — ABNORMAL HIGH (ref 0.44–1.00)
GFR, Estimated: >60 mL/min (ref >60)
Glucose, Bld: 96 mg/dL (ref 70–99)
Potassium: 4.3 mmol/L (ref 3.5–5.1)
Sodium: 136 mmol/L (ref 135–145)
Total Bilirubin: 0.7 mg/dL (ref 0.3–1.2)
Total Protein: 7.2 g/dL (ref 6.5–8.1)

## 2019-12-30 LAB — BRAIN NATRIURETIC PEPTIDE: B Natriuretic Peptide: 405.7 pg/mL — ABNORMAL HIGH (ref 0.0–100.0)

## 2019-12-30 MED ORDER — TORSEMIDE 20 MG PO TABS
20.0000 mg | ORAL_TABLET | ORAL | 1 refills | Status: DC | PRN
Start: 2019-12-30 — End: 2020-04-21

## 2019-12-30 MED ORDER — LOSARTAN POTASSIUM 25 MG PO TABS
25.0000 mg | ORAL_TABLET | Freq: Two times a day (BID) | ORAL | 6 refills | Status: DC
Start: 2019-12-30 — End: 2020-05-04

## 2019-12-30 MED ORDER — POTASSIUM CHLORIDE ER 20 MEQ PO TBCR
1.0000 | EXTENDED_RELEASE_TABLET | ORAL | 3 refills | Status: DC | PRN
Start: 2019-12-30 — End: 2021-03-01

## 2019-12-30 NOTE — Addendum Note (Signed)
Encounter addended by: Malena Edman, RN on: 12/30/2019 12:32 PM  Actions taken: Pharmacy for encounter modified, Charge Capture section accepted, Clinical Note Signed, Order list changed, Diagnosis association updated

## 2019-12-30 NOTE — Progress Notes (Signed)
PCP: Dr Melford Aase Primary Cardiologist: Dr Haroldine Laws  Oncology: Dr Julien Nordmann  Reason for Visit:  Heart Failure  HPI: Natalie Baker a 64 y.o.femalewith a hx of palpitations thought to be PVCs, HTN, non hodgkins lymphoma treated w/ R-CHOP.  Admitted 06/2019 to [facility] with increased shortness of breath and abdominal discomfort. ECHO showed severely reduced EF 20-25% w/ mod-severe MR.  RV moderately reduced.  She developed VT and was started on amiodarone and transferred to [facility] for AHF consultation. R/LHC showed normal coronaries, elevated filling pressures w/ low CO c/w cardiogenic shock. CI was 1.7. She was started on milrinone, IV amiodarone and IV Lasix for diuresis. Responded well to therapy and gradually weaned off milrinone. Placed on spiro, losartan, and dig. No BB with low output. Torsemide added for volume control. Was discharged home w/ LifeVest.   She presents to clinic today for f/u for systolic heart failure and for further med titration. She is here w/ her daughter. Over the last several visits, we have added corlanor. She was tried on Entresto but developed cough after initiation. She was switched back to Losartan.       Today she returns for HF follow up. Here with her daughter. Feels good. Not walking as much as she was. No SOB, edema, orthopnea or PND. Occasionally woozy when she stands.   Echo 9/21: EF 40-45%   ECHO 06/2019 EF 20-25%  moderately reduced RV function with severe MR/TR  Cath 6/18 with normal cors. Low output. Ao = 109/71 (81) LV =  99/23 RA =  15 RV =  43/18 PA =  35/14 (25) PCW = 22 Fick cardiac output/index = 3.3/1.7 PVR = < 1.0 WU Ao sat = 100% PA sat = 49%, 53% Assessment: 1. Normal coronary arteries 2. Severe NICM EF 20% 3. Elevated filling pressures and low CO c/w cardiogenic shock 4. Brief run of VT   ROS: All systems negative except as listed in HPI, PMH and Problem List.  SH:  Social History   Socioeconomic History  . Marital status:  Married    Spouse name: Not on file  . Number of children: 1  . Years of education: Not on file  . Highest education level: Not on file  Occupational History  . Occupation: Theatre manager  Tobacco Use  . Smoking status: Former Smoker    Years: 15.00    Types: Cigarettes    Quit date: 01/08/1996    Years since quitting: 23.9  . Smokeless tobacco: Never Used  . Tobacco comment: quit 1998 but is exposed to 2nd hand smoke  Vaping Use  . Vaping Use: Never used  Substance and Sexual Activity  . Alcohol use: No  . Drug use: No  . Sexual activity: Yes    Partners: Male    Birth control/protection: Post-menopausal  Other Topics Concern  . 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 Lack of Transportation (Medical): No  . Lack of Transportation (Non-Medical): No  Physical Activity: Not on file  Stress: Not on file  Social Connections: Moderately Integrated  . Frequency of Communication with Friends and Family: More than three times a week  . Frequency of Social Gatherings with Friends and Family: Once a week  . Attends Religious Services: More than  4 times per year  . Active Member of Clubs or Organizations: No  . Attends Archivist Meetings: Never  . Marital Status: Married  Human resources officer Violence: Not on file    FH:  Family History  Problem Relation Age of Onset  . Heart disease Mother 53       chf  . Diabetes Mother   . Thyroid disease Mother   . Cancer Mother 30       female/ skin  . Hypertension Mother   . Liver disease Mother   . Hypertension Father   . Alcohol abuse Father   . Heart disease Father   . Breast cancer Maternal Aunt   . Hyperlipidemia Maternal Aunt   . Heart disease Maternal Aunt   . Hyperlipidemia  Brother   . Hypertension Brother        x 2  . Diabetes Brother   . Hypothyroidism Brother   . Liver disease Maternal Grandmother   . Diabetes Paternal Grandmother   . Bone cancer Maternal Aunt        and skull  . Breast cancer Sister 26  . Diabetes Sister   . Hypothyroidism Sister   . Hashimoto's thyroiditis Sister   . Breast cancer Niece 56  . Colon cancer Neg Hx   . Colon polyps Neg Hx   . Esophageal cancer Neg Hx   . Rectal cancer Neg Hx   . Stomach cancer Neg Hx     Past Medical History:  Diagnosis Date  . Allergy   . Cancer [facility])    being worked up for [place]  . CHF (congestive heart failure) ([place])   . Colon polyp 12/29/2006  . 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 Hyperlipidemia   . Hypothyroidism   . Low kidney function   . Obesity    BMI 34  . Osteoporosis   . Palpitations   . Vitamin D deficiency     Current Outpatient Medications  Medication Sig Dispense Refill  . amiodarone (PACERONE) 100 MG tablet Take 1 tablet (100 mg total) by mouth daily. 30 tablet 5  . ARMOUR THYROID 90 MG tablet Take 90 mg by mouth every morning.    . carvedilol (COREG) 6.25 MG tablet Take 1 tablet (6.25 mg total) by mouth 2 (two) times daily. 60 tablet 11  . dapagliflozin propanediol (FARXIGA) 10 MG TABS tablet Take 1 tablet (10 mg total) by mouth daily before breakfast. 30 tablet 11  . estradiol (VIVELLE-DOT) 0.075 MG/24HR Place 1 patch onto the skin 2 (two) times a week.     . losartan (COZAAR) 25 MG tablet TAKE 1/2 TABLET (12.5 MG TOTAL) BY MOUTH IN THE MORNING AND AT BEDTIME. 30 tablet 6  . Potassium Chloride ER 20 MEQ TBCR Take 1 tablet by mouth every other day. 90 tablet 3  . spironolactone (ALDACTONE) 25 MG tablet TAKE 1 TABLET BY MOUTH EVERY DAY 90 tablet 3  . torsemide (DEMADEX) 20 MG tablet Take 1 tablet (20 mg total) by mouth every other day. 90 tablet 1   No current facility-administered medications for this encounter.    Vitals:   12/30/19 1138  BP: 130/80  Pulse: 85   SpO2: 95%  Weight: 86.9 kg (191 lb 9.6 oz)   Wt Readings from Last 3 Encounters:  12/30/19 86.9 kg (191 lb 9.6 oz)  12/13/19 86.2 kg (190 lb)  11/04/19 85.9 kg (189 lb 6.4 oz)   PHYSICAL EXAM: General:  Well appearing. No resp difficulty HEENT: normal  Neck: supple. no JVD. Carotids 2+ bilat; no bruits. No lymphadenopathy or thryomegaly appreciated. Cor: PMI nondisplaced. Regular rate & rhythm. No rubs, gallops or murmurs. Lungs: clear Abdomen: soft, nontender, nondistended. No hepatosplenomegaly. No bruits or masses. Good bowel sounds. Extremities: no cyanosis, clubbing, rash, edema Neuro: alert & orientedx3, cranial nerves grossly intact. moves all 4 extremities w/o difficulty. Affect pleasant   ASSESSMENT & PLAN: 1. 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 RV normal - Echo EF20-25% moderately reduced RV function with severe MR/TR - most likely from R-CHOP - Cath 06/25/19 no CAD. Low output (CI 1.7). Milrinone started 06/25/19 and stopped 06/30/19 - Echo 9/21 EF 40-45% with Grade II DD.  - NYHA I - Volume status ok.  likely a little low with orthostasis. Take torsemide only as needed - Continue carvedilol 6.25 bid - Increase losartan 25 bid - continue spiro 25 daily - Continue Farxiga 10  - labs today - RTC in 2-3 months with echo   2. VT - Off Lifevest - Continue Amiodarone 100 mg daily will plan to stop at next visit if EF stable  3.Stage IV non-Hodgkin's lymphoma, mantle cell lymphoma - s/p R-CHOP w/ Neulasta - per Dr Earlie Server, conservative mgt for now   Glori Bickers  MD 12:09 PM

## 2019-12-30 NOTE — Patient Instructions (Addendum)
INCREASE Losartan 25mg  (1 tablet) Twice daily  Only take Torsemide AS NEEDED   Take Potassium EVERY TIME YOU TAKE THE TORSEMIDE  Labs done today, your results will be available in MyChart, we will contact you for abnormal readings.  Your physician recommends that you schedule a follow-up appointment in: 3 months with an echocardiogram  Your physician has requested that you have an echocardiogram. Echocardiography is a painless test that uses sound waves to create images of your heart. It provides your doctor with information about the size and shape of your heart and how well your hearts chambers and valves are working. This procedure takes approximately one hour. There are no restrictions for this procedure.  If you have any questions or concerns before your next appointment please send [place] a message through [place] or call our office at [phone].    TO LEAVE A MESSAGE FOR THE NURSE SELECT OPTION 2, PLEASE LEAVE A MESSAGE INCLUDING:  YOUR NAME  DATE OF BIRTH  CALL BACK NUMBER  REASON FOR CALL**this is important as we prioritize the call backs  YOU WILL RECEIVE A CALL BACK THE SAME DAY AS LONG AS YOU CALL BEFORE 4:00 PM

## 2020-01-27 ENCOUNTER — Telehealth: Payer: Self-pay | Admitting: Internal Medicine

## 2020-01-27 NOTE — Telephone Encounter (Signed)
Returned call to schedule port flush appointment per 1/20 schedule message. Patient wanted to ask nurse if it was necessary to have a port flush now.

## 2020-02-02 ENCOUNTER — Telehealth: Payer: Self-pay | Admitting: Internal Medicine

## 2020-02-02 NOTE — Telephone Encounter (Signed)
Scheduled port-flush appointments per 1/26 schedule message. Patient is aware.

## 2020-02-04 ENCOUNTER — Inpatient Hospital Stay: Payer: BC Managed Care – PPO | Attending: Physician Assistant

## 2020-02-04 ENCOUNTER — Other Ambulatory Visit: Payer: Self-pay

## 2020-02-04 DIAGNOSIS — Z95828 Presence of other vascular implants and grafts: Secondary | ICD-10-CM

## 2020-02-04 DIAGNOSIS — Z452 Encounter for adjustment and management of vascular access device: Secondary | ICD-10-CM | POA: Diagnosis not present

## 2020-02-04 DIAGNOSIS — C8318 Mantle cell lymphoma, lymph nodes of multiple sites: Secondary | ICD-10-CM | POA: Diagnosis not present

## 2020-02-04 MED ORDER — HEPARIN SOD (PORK) LOCK FLUSH 100 UNIT/ML IV SOLN
500.0000 [IU] | Freq: Once | INTRAVENOUS | Status: AC | PRN
  Administered 2020-02-04: 500 [IU]
  Filled 2020-02-04: qty 5

## 2020-02-04 MED ORDER — SODIUM CHLORIDE 0.9% FLUSH
10.0000 mL | INTRAVENOUS | Status: DC | PRN
  Administered 2020-02-04: 10 mL
  Filled 2020-02-04: qty 10

## 2020-02-04 NOTE — Patient Instructions (Signed)

## 2020-02-22 ENCOUNTER — Encounter (HOSPITAL_COMMUNITY): Payer: Self-pay

## 2020-03-17 ENCOUNTER — Other Ambulatory Visit: Payer: Self-pay

## 2020-03-17 ENCOUNTER — Inpatient Hospital Stay: Payer: BC Managed Care – PPO | Attending: Physician Assistant

## 2020-03-17 DIAGNOSIS — Z95828 Presence of other vascular implants and grafts: Secondary | ICD-10-CM

## 2020-03-17 DIAGNOSIS — Z452 Encounter for adjustment and management of vascular access device: Secondary | ICD-10-CM | POA: Diagnosis not present

## 2020-03-17 DIAGNOSIS — C8318 Mantle cell lymphoma, lymph nodes of multiple sites: Secondary | ICD-10-CM | POA: Diagnosis not present

## 2020-03-17 MED ORDER — HEPARIN SOD (PORK) LOCK FLUSH 100 UNIT/ML IV SOLN
500.0000 [IU] | Freq: Once | INTRAVENOUS | Status: AC | PRN
  Administered 2020-03-17: 500 [IU]
  Filled 2020-03-17: qty 5

## 2020-03-17 MED ORDER — SODIUM CHLORIDE 0.9% FLUSH
10.0000 mL | INTRAVENOUS | Status: DC | PRN
  Administered 2020-03-17: 10 mL
  Filled 2020-03-17: qty 10

## 2020-03-29 ENCOUNTER — Ambulatory Visit (HOSPITAL_COMMUNITY): Payer: BC Managed Care – PPO

## 2020-03-29 ENCOUNTER — Encounter (HOSPITAL_COMMUNITY): Payer: BC Managed Care – PPO | Admitting: Internal Medicine

## 2020-04-21 ENCOUNTER — Other Ambulatory Visit (HOSPITAL_COMMUNITY): Payer: Self-pay | Admitting: Adult Health

## 2020-04-28 ENCOUNTER — Inpatient Hospital Stay: Payer: BC Managed Care – PPO | Attending: Physician Assistant

## 2020-04-28 ENCOUNTER — Other Ambulatory Visit: Payer: Self-pay

## 2020-04-28 DIAGNOSIS — C8318 Mantle cell lymphoma, lymph nodes of multiple sites: Secondary | ICD-10-CM | POA: Diagnosis not present

## 2020-04-28 DIAGNOSIS — Z95828 Presence of other vascular implants and grafts: Secondary | ICD-10-CM

## 2020-04-28 DIAGNOSIS — Z452 Encounter for adjustment and management of vascular access device: Secondary | ICD-10-CM | POA: Diagnosis not present

## 2020-04-28 MED ORDER — HEPARIN SOD (PORK) LOCK FLUSH 100 UNIT/ML IV SOLN
500.0000 [IU] | Freq: Once | INTRAVENOUS | Status: AC | PRN
  Administered 2020-04-28: 500 [IU]
  Filled 2020-04-28: qty 5

## 2020-04-28 MED ORDER — SODIUM CHLORIDE 0.9% FLUSH
10.0000 mL | INTRAVENOUS | Status: DC | PRN
  Administered 2020-04-28: 10 mL
  Filled 2020-04-28: qty 10

## 2020-04-28 NOTE — Patient Instructions (Signed)
Implanted Port Insertion, Care After This sheet gives you information about how to care for yourself after your procedure. Your health care provider may also give you more specific instructions. If you have problems or questions, contact your health care provider. What can I expect after the procedure? After the procedure, it is common to have:  Discomfort at the port insertion site.  Bruising on the skin over the port. This should improve over 3-4 days. Follow these instructions at home: Port care  After your port is placed, you will get a manufacturer's information card. The card has information about your port. Keep this card with you at all times.  Take care of the port as told by your health care provider. Ask your health care provider if you or a family member can get training for taking care of the port at home. A home health care nurse may also take care of the port.  Make sure to remember what type of port you have. Incision care  Follow instructions from your health care provider about how to take care of your port insertion site. Make sure you: ? Wash your hands with soap and water before and after you change your bandage (dressing). If soap and water are not available, use hand sanitizer. ? Change your dressing as told by your health care provider. ? Leave stitches (sutures), skin glue, or adhesive strips in place. These skin closures may need to stay in place for 2 weeks or longer. If adhesive strip edges start to loosen and curl up, you may trim the loose edges. Do not remove adhesive strips completely unless your health care provider tells you to do that.  Check your port insertion site every day for signs of infection. Check for: ? Redness, swelling, or pain. ? Fluid or blood. ? Warmth. ? Pus or a bad smell.      Activity  Return to your normal activities as told by your health care provider. Ask your health care provider what activities are safe for you.  Do not  lift anything that is heavier than 10 lb (4.5 kg), or the limit that you are told, until your health care provider says that it is safe. General instructions  Take over-the-counter and prescription medicines only as told by your health care provider.  Do not take baths, swim, or use a hot tub until your health care provider approves. Ask your health care provider if you may take showers. You may only be allowed to take sponge baths.  Do not drive for 24 hours if you were given a sedative during your procedure.  Wear a medical alert bracelet in case of an emergency. This will tell any health care providers that you have a port.  Keep all follow-up visits as told by your health care provider. This is important. Contact a health care provider if:  You cannot flush your port with saline as directed, or you cannot draw blood from the port.  You have a fever or chills.  You have redness, swelling, or pain around your port insertion site.  You have fluid or blood coming from your port insertion site.  Your port insertion site feels warm to the touch.  You have pus or a bad smell coming from the port insertion site. Get help right away if:  You have chest pain or shortness of breath.  You have bleeding from your port that you cannot control. Summary  Take care of the port as told by your   health care provider. Keep the manufacturer's information card with you at all times.  Change your dressing as told by your health care provider.  Contact a health care provider if you have a fever or chills or if you have redness, swelling, or pain around your port insertion site.  Keep all follow-up visits as told by your health care provider. This information is not intended to replace advice given to you by your health care provider. Make sure you discuss any questions you have with your health care provider. Document Revised: 07/22/2017 Document Reviewed: 07/22/2017 Elsevier Patient Education   2021 Elsevier Inc.  

## 2020-04-30 NOTE — Progress Notes (Signed)
PCP: Dr Melford Aase Primary Cardiologist: Dr Haroldine Laws  Oncology: Dr Julien Nordmann  Reason for Visit:  Heart Failure   HPI: Natalie Baker a 65 y.o.femalewith a hx of palpitations thought to be PVCs, HTN, non hodgkins lymphoma treated w/ R-CHOP and systolic HF  Admitted 7/82 with acute HF. ECHO  EF 20-25% w/ mod-severe MR.  RV moderately reduced. Developed VT and was started on amiodarone and transferred to [facility] for  [facility] showed normal coronaries, elevated filling pressures w/ low CO c/w cardiogenic shock. CI was 1.7. She was started on milrinone, IV amiodarone and IV Lasix for diuresis.  Echo 9/21: EF 40-45%   Today she returns for HF follow up. Feels great. Takes torsemide 10 mg daily. No edema, orthopnea or PND. No palpitations. Has f/u with Oncology in June.   Echo today 05/04/20 EF 50-55% G2DD. Personally reviewed   Studies:  ECHO 06/2019 EF 20-25%  moderately reduced RV function with severe MR/TR  Cath 6/18 with normal cors. Low output. Ao = 109/71 (81) LV =  99/23 RA =  15 RV =  43/18 PA =  35/14 (25) PCW = 22 Fick cardiac output/index = 3.3/1.7 PVR = < 1.0 WU Ao sat = 100% PA sat = 49%, 53% Assessment: 1. Normal coronary arteries 2. Severe NICM EF 20% 3. Elevated filling pressures and low CO c/w cardiogenic shock 4. Brief run of VT   ROS: All systems negative except as listed in HPI, PMH and Problem List.  SH:  Social History   Socioeconomic History  . Marital status: Married    Spouse name: Not on file  . Number of children: 1  . Years of education: Not on file  . Highest education level: Not on file  Occupational History  . Occupation: Theatre manager  Tobacco Use  . Smoking status: Former Smoker    Years: 15.00    Types: Cigarettes    Quit date: 01/08/1996    Years since quitting: 24.3  . Smokeless tobacco: Never Used  . Tobacco comment: quit 1998 but is exposed to 2nd hand smoke  Vaping Use  . Vaping Use: Never used  Substance and Sexual  Activity  . Alcohol use: No  . Drug use: No  . Sexual activity: Yes    Partners: Male    Birth control/protection: Post-menopausal  Other Topics Concern  . 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 Lack of Transportation (Medical): No  . Lack of Transportation (Non-Medical): No  Physical Activity: Not on file  Stress: Not on file  Social Connections: Moderately Integrated  . Frequency of Communication with Friends and Family: More than three times a week  . Frequency of Social Gatherings with Friends and Family: Once a week  . Attends Religious Services: More than 4 times per year  . Active Member of Clubs or Organizations: No  . Attends Archivist Meetings: Never  . Marital Status: Married  Human resources officer Violence: Not on file    FH:  Family History  Problem Relation Age of Onset  . Heart disease Mother 64       chf  . Diabetes Mother   . Thyroid disease Mother   . Cancer Mother 59       female/ skin  .  Hypertension Mother   . Liver disease Mother   . Hypertension Father   . Alcohol abuse Father   . Heart disease Father   . Breast cancer Maternal Aunt   . Hyperlipidemia Maternal Aunt   . Heart disease Maternal Aunt   . Hyperlipidemia Brother   . Hypertension Brother        x 2  . Diabetes Brother   . Hypothyroidism Brother   . Liver disease Maternal Grandmother   . Diabetes Paternal Grandmother   . Bone cancer Maternal Aunt        and skull  . Breast cancer Sister 65  . Diabetes Sister   . Hypothyroidism Sister   . Hashimoto's thyroiditis Sister   . Breast cancer Niece 21  . Colon cancer Neg Hx   . Colon polyps Neg Hx   . Esophageal cancer Neg Hx   . Rectal cancer Neg Hx   . Stomach cancer Neg Hx     Past  Medical History:  Diagnosis Date  . Allergy   . Cancer [facility])    being worked up for [place]  . CHF (congestive heart failure) ([place])   . Colon polyp 12/29/2006  . 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 Hyperlipidemia   . Hypothyroidism   . Low kidney function   . Obesity    BMI 34  . Osteoporosis   . Palpitations   . Vitamin D deficiency     Current Outpatient Medications  Medication Sig Dispense Refill  . amiodarone (PACERONE) 100 MG tablet Take 1 tablet (100 mg total) by mouth daily. 30 tablet 5  . ARMOUR THYROID 90 MG tablet Take 90 mg by mouth every morning.    . carvedilol (COREG) 6.25 MG tablet Take 1 tablet (6.25 mg total) by mouth 2 (two) times daily. 60 tablet 11  . dapagliflozin propanediol (FARXIGA) 10 MG TABS tablet Take 1 tablet (10 mg total) by mouth daily before breakfast. 30 tablet 11  . estradiol (VIVELLE-DOT) 0.075 MG/24HR Place 1 patch onto the skin 2 (two) times a week.     . losartan (COZAAR) 25 MG tablet Take 1 tablet (25 mg total) by mouth in the morning and at bedtime. 60 tablet 6  . Potassium Chloride ER 20 MEQ TBCR Take 1 tablet by mouth as needed. Take with Torsemide 90 tablet 3  . spironolactone (ALDACTONE) 25 MG tablet TAKE 1 TABLET BY MOUTH EVERY DAY 90 tablet 3  . torsemide (DEMADEX) 20 MG tablet TAKE 1 TABLET BY MOUTH EVERY DAY 90 tablet 3   No current facility-administered medications for this encounter.    Vitals:   05/04/20 1511  BP: 108/80  Pulse: 90  SpO2: 96%  Weight: 88.7 kg (195 lb 9.6 oz)   Wt Readings from Last 3 Encounters:  12/30/19 86.9 kg (191 lb 9.6 oz)  12/13/19 86.2 kg (190 lb)  11/04/19 85.9 kg (189 lb 6.4 oz)   PHYSICAL EXAM: General:  Well appearing. No resp difficulty HEENT: normal Neck: supple. no JVD. Carotids 2+ bilat; no bruits. No lymphadenopathy or thryomegaly appreciated. Cor: PMI nondisplaced. Regular rate & rhythm. No rubs, gallops or murmurs. Lungs: clear Abdomen: obese, soft, nontender, nondistended. No  hepatosplenomegaly. No bruits or masses. Good bowel sounds. Extremities: no cyanosis, clubbing, rash, edema Neuro: alert & orientedx3, cranial nerves grossly intact. moves all 4 extremities w/o difficulty. Affect pleasant   ASSESSMENT & PLAN: 1. 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 RV normal - Echo  EF20-25% moderately reduced RV function with severe MR/TR - Echo today 05/04/20 EF 50-55% G2DD. Personally reviewed - Most likely from R-CHOP - Cath 06/25/19 no CAD.CI 1.7 - Echo 9/21 EF 40-45% with Grade II DD.  - NYHA I - Volume status ok.Taking torsemide 10mg  daily.  - Continue carvedilol 6.25 bid - Continue losartan 25 bid - Continue spiro 25 daily - Continue Farxiga 10   2. VT - Off Lifevest - stop amio   3.Stage IV non-Hodgkin's lymphoma, mantle cell lymphoma - s/p R-CHOP w/ Neulasta - per Dr Earlie Server, conservative mgt for now   Glori Bickers  MD 11:15 PM

## 2020-05-04 ENCOUNTER — Other Ambulatory Visit: Payer: Self-pay

## 2020-05-04 ENCOUNTER — Ambulatory Visit (HOSPITAL_BASED_OUTPATIENT_CLINIC_OR_DEPARTMENT_OTHER)
Admission: RE | Admit: 2020-05-04 | Discharge: 2020-05-04 | Disposition: A | Discharge status: Home / Self Care | Payer: BC Managed Care – PPO | Source: Ambulatory Visit | Attending: Internal Medicine | Admitting: Internal Medicine

## 2020-05-04 ENCOUNTER — Ambulatory Visit (HOSPITAL_COMMUNITY)
Admission: RE | Admit: 2020-05-04 | Discharge: 2020-05-04 | Disposition: A | Discharge status: Home / Self Care | Payer: BC Managed Care – PPO | Source: Ambulatory Visit | Attending: Internal Medicine | Admitting: Internal Medicine

## 2020-05-04 ENCOUNTER — Encounter (HOSPITAL_COMMUNITY): Payer: Self-pay | Admitting: Internal Medicine

## 2020-05-04 VITALS — BP 108/80 | HR 90 | Wt 195.6 lb

## 2020-05-04 DIAGNOSIS — I110 Hypertensive heart disease with heart failure: Secondary | ICD-10-CM | POA: Diagnosis not present

## 2020-05-04 DIAGNOSIS — Z7984 Long term (current) use of oral hypoglycemic drugs: Secondary | ICD-10-CM | POA: Diagnosis not present

## 2020-05-04 DIAGNOSIS — E039 Hypothyroidism, unspecified: Secondary | ICD-10-CM | POA: Diagnosis not present

## 2020-05-04 DIAGNOSIS — C8316 Mantle cell lymphoma, intrapelvic lymph nodes: Secondary | ICD-10-CM | POA: Diagnosis not present

## 2020-05-04 DIAGNOSIS — Z79899 Other long term (current) drug therapy: Secondary | ICD-10-CM | POA: Insufficient documentation

## 2020-05-04 DIAGNOSIS — Z8679 Personal history of other diseases of the circulatory system: Secondary | ICD-10-CM | POA: Insufficient documentation

## 2020-05-04 DIAGNOSIS — I472 Ventricular tachycardia, unspecified: Secondary | ICD-10-CM

## 2020-05-04 DIAGNOSIS — C8310 Mantle cell lymphoma, unspecified site: Secondary | ICD-10-CM | POA: Insufficient documentation

## 2020-05-04 DIAGNOSIS — I5082 Biventricular heart failure: Secondary | ICD-10-CM | POA: Insufficient documentation

## 2020-05-04 DIAGNOSIS — Z79890 Hormone replacement therapy (postmenopausal): Secondary | ICD-10-CM | POA: Diagnosis not present

## 2020-05-04 DIAGNOSIS — Z87891 Personal history of nicotine dependence: Secondary | ICD-10-CM | POA: Insufficient documentation

## 2020-05-04 DIAGNOSIS — Z9221 Personal history of antineoplastic chemotherapy: Secondary | ICD-10-CM | POA: Insufficient documentation

## 2020-05-04 DIAGNOSIS — I5022 Chronic systolic (congestive) heart failure: Secondary | ICD-10-CM

## 2020-05-04 DIAGNOSIS — E785 Hyperlipidemia, unspecified: Secondary | ICD-10-CM | POA: Diagnosis not present

## 2020-05-04 LAB — ECHOCARDIOGRAM COMPLETE
Area-P 1/2: 3.65 cm2
S' Lateral: 3.5 cm

## 2020-05-04 NOTE — Patient Instructions (Signed)
Stop Amiodarone  Your physician recommends that you schedule a follow-up appointment in: 9 months Call our office in December for an appointment  .If you have any questions or concerns before your next appointment please send [place] a message through [place] or call our office at [phone].    TO LEAVE A MESSAGE FOR THE NURSE SELECT OPTION 2, PLEASE LEAVE A MESSAGE INCLUDING: . YOUR NAME . DATE OF BIRTH . CALL BACK NUMBER . REASON FOR CALL**this is important as we prioritize the call backs  [place] AS LONG AS YOU CALL BEFORE 4:00 PM  At the [facility], you and your health needs are our priority. As part of our continuing mission to provide you with exceptional heart care, we have created designated Provider Care Teams. These Care Teams include your primary Cardiologist (physician) and Advanced Practice Providers (APPs- Physician Assistants and Nurse Practitioners) who all work together to provide you with the care you need, when you need it.   You may see any of the following providers on your designated Care Team at your next follow up: Marland Kitchen Dr Glori Bickers . Dr Loralie Champagne . Dr Vickki Muff . Darrick Grinder, NP . Lyda Jester, Seth Ward . Audry Riles, PharmD   Please be sure to bring in all your medications bottles to every appointment.

## 2020-05-04 NOTE — Progress Notes (Signed)
  Echocardiogram 2D Echocardiogram has been performed.  Natalie Baker 05/04/2020, 3:01 PM

## 2020-05-16 ENCOUNTER — Telehealth: Payer: Self-pay | Admitting: Internal Medicine

## 2020-05-16 NOTE — Telephone Encounter (Signed)
R/s per prov request, pt aware 

## 2020-05-17 DIAGNOSIS — D485 Neoplasm of uncertain behavior of skin: Secondary | ICD-10-CM | POA: Diagnosis not present

## 2020-05-17 DIAGNOSIS — L728 Other follicular cysts of the skin and subcutaneous tissue: Secondary | ICD-10-CM | POA: Diagnosis not present

## 2020-05-17 DIAGNOSIS — D2361 Other benign neoplasm of skin of right upper limb, including shoulder: Secondary | ICD-10-CM | POA: Diagnosis not present

## 2020-05-17 DIAGNOSIS — D229 Melanocytic nevi, unspecified: Secondary | ICD-10-CM | POA: Diagnosis not present

## 2020-05-17 DIAGNOSIS — L821 Other seborrheic keratosis: Secondary | ICD-10-CM | POA: Diagnosis not present

## 2020-05-17 DIAGNOSIS — L820 Inflamed seborrheic keratosis: Secondary | ICD-10-CM | POA: Diagnosis not present

## 2020-05-17 DIAGNOSIS — L814 Other melanin hyperpigmentation: Secondary | ICD-10-CM | POA: Diagnosis not present

## 2020-06-04 ENCOUNTER — Other Ambulatory Visit: Payer: Self-pay | Admitting: Internal Medicine

## 2020-06-04 DIAGNOSIS — R7309 Other abnormal glucose: Secondary | ICD-10-CM

## 2020-06-04 DIAGNOSIS — N1831 Chronic kidney disease, stage 3a: Secondary | ICD-10-CM

## 2020-06-04 DIAGNOSIS — E559 Vitamin D deficiency, unspecified: Secondary | ICD-10-CM

## 2020-06-04 DIAGNOSIS — R0989 Other specified symptoms and signs involving the circulatory and respiratory systems: Secondary | ICD-10-CM

## 2020-06-04 DIAGNOSIS — E782 Mixed hyperlipidemia: Secondary | ICD-10-CM

## 2020-06-04 DIAGNOSIS — E039 Hypothyroidism, unspecified: Secondary | ICD-10-CM

## 2020-06-04 DIAGNOSIS — Z79899 Other long term (current) drug therapy: Secondary | ICD-10-CM

## 2020-06-04 DIAGNOSIS — I700 Atherosclerosis of aorta: Secondary | ICD-10-CM

## 2020-06-07 ENCOUNTER — Encounter: Payer: Self-pay | Admitting: Internal Medicine

## 2020-06-09 ENCOUNTER — Ambulatory Visit: Payer: BC Managed Care – PPO | Admitting: Adult Health

## 2020-06-09 ENCOUNTER — Encounter: Payer: Self-pay | Admitting: Adult Health

## 2020-06-09 ENCOUNTER — Inpatient Hospital Stay: Payer: BC Managed Care – PPO | Attending: Physician Assistant

## 2020-06-09 ENCOUNTER — Ambulatory Visit (HOSPITAL_COMMUNITY)
Admission: RE | Admit: 2020-06-09 | Discharge: 2020-06-09 | Disposition: A | Discharge status: Home / Self Care | Payer: BC Managed Care – PPO | Source: Ambulatory Visit | Attending: Internal Medicine | Admitting: Internal Medicine

## 2020-06-09 ENCOUNTER — Inpatient Hospital Stay: Payer: BC Managed Care – PPO

## 2020-06-09 ENCOUNTER — Other Ambulatory Visit: Payer: Self-pay

## 2020-06-09 VITALS — BP 102/72 | HR 95 | Temp 97.5°F | Ht 62.0 in | Wt 201.0 lb

## 2020-06-09 DIAGNOSIS — E785 Hyperlipidemia, unspecified: Secondary | ICD-10-CM | POA: Diagnosis not present

## 2020-06-09 DIAGNOSIS — R911 Solitary pulmonary nodule: Secondary | ICD-10-CM | POA: Diagnosis not present

## 2020-06-09 DIAGNOSIS — E559 Vitamin D deficiency, unspecified: Secondary | ICD-10-CM

## 2020-06-09 DIAGNOSIS — E039 Hypothyroidism, unspecified: Secondary | ICD-10-CM

## 2020-06-09 DIAGNOSIS — N1831 Chronic kidney disease, stage 3a: Secondary | ICD-10-CM

## 2020-06-09 DIAGNOSIS — R7303 Prediabetes: Secondary | ICD-10-CM

## 2020-06-09 DIAGNOSIS — I509 Heart failure, unspecified: Secondary | ICD-10-CM | POA: Insufficient documentation

## 2020-06-09 DIAGNOSIS — I5022 Chronic systolic (congestive) heart failure: Secondary | ICD-10-CM

## 2020-06-09 DIAGNOSIS — Z79899 Other long term (current) drug therapy: Secondary | ICD-10-CM

## 2020-06-09 DIAGNOSIS — R0989 Other specified symptoms and signs involving the circulatory and respiratory systems: Secondary | ICD-10-CM

## 2020-06-09 DIAGNOSIS — Z90710 Acquired absence of both cervix and uterus: Secondary | ICD-10-CM | POA: Diagnosis not present

## 2020-06-09 DIAGNOSIS — C8590 Non-Hodgkin lymphoma, unspecified, unspecified site: Secondary | ICD-10-CM | POA: Diagnosis not present

## 2020-06-09 DIAGNOSIS — I700 Atherosclerosis of aorta: Secondary | ICD-10-CM

## 2020-06-09 DIAGNOSIS — R590 Localized enlarged lymph nodes: Secondary | ICD-10-CM | POA: Diagnosis not present

## 2020-06-09 DIAGNOSIS — C8310 Mantle cell lymphoma, unspecified site: Secondary | ICD-10-CM

## 2020-06-09 DIAGNOSIS — C8318 Mantle cell lymphoma, lymph nodes of multiple sites: Secondary | ICD-10-CM | POA: Insufficient documentation

## 2020-06-09 LAB — CBC WITH DIFFERENTIAL (CANCER CENTER ONLY)
Abs Immature Granulocytes: 0.03 10*3/uL (ref 0.00–0.07)
Basophils Absolute: 0.1 10*3/uL (ref 0.0–0.1)
Basophils Relative: 1 %
Eosinophils Absolute: 0.1 10*3/uL (ref 0.0–0.5)
Eosinophils Relative: 2 %
HCT: 44 % (ref 36.0–46.0)
Hemoglobin: 14.3 g/dL (ref 12.0–15.0)
Immature Granulocytes: 0 %
Lymphocytes Relative: 19 %
Lymphs Abs: 1.3 10*3/uL (ref 0.7–4.0)
MCH: 28.4 pg (ref 26.0–34.0)
MCHC: 32.5 g/dL (ref 30.0–36.0)
MCV: 87.5 fL (ref 80.0–100.0)
Monocytes Absolute: 0.8 10*3/uL (ref 0.1–1.0)
Monocytes Relative: 11 %
Neutro Abs: 4.5 10*3/uL (ref 1.7–7.7)
Neutrophils Relative %: 67 %
Platelet Count: 237 10*3/uL (ref 150–400)
RBC: 5.03 MIL/uL (ref 3.87–5.11)
RDW: 14.9 % (ref 11.5–15.5)
WBC Count: 6.7 10*3/uL (ref 4.0–10.5)
nRBC: 0 % (ref 0.0–0.2)

## 2020-06-09 LAB — CMP (CANCER CENTER ONLY)
ALT: 15 U/L (ref 0–44)
AST: 19 U/L (ref 15–41)
Albumin: 3.8 g/dL (ref 3.5–5.0)
Alkaline Phosphatase: 78 U/L (ref 38–126)
Anion gap: 10 (ref 5–15)
BUN: 15 mg/dL (ref 8–23)
CO2: 26 mmol/L (ref 22–32)
Calcium: 9.3 mg/dL (ref 8.9–10.3)
Chloride: 102 mmol/L (ref 98–111)
Creatinine: 0.92 mg/dL (ref 0.44–1.00)
GFR, Estimated: >60 mL/min (ref >60)
Glucose, Bld: 88 mg/dL (ref 70–99)
Potassium: 4.5 mmol/L (ref 3.5–5.1)
Sodium: 138 mmol/L (ref 135–145)
Total Bilirubin: 0.4 mg/dL (ref 0.3–1.2)
Total Protein: 7 g/dL (ref 6.5–8.1)

## 2020-06-09 LAB — LACTATE DEHYDROGENASE: LDH: 175 U/L (ref 98–192)

## 2020-06-09 MED ORDER — ARMOUR THYROID 90 MG PO TABS: 90.0000 mg | ORAL_TABLET | Freq: Every morning | ORAL | 1 refills | Status: DC

## 2020-06-09 MED ORDER — HEPARIN SOD (PORK) LOCK FLUSH 100 UNIT/ML IV SOLN
500.0000 [IU] | Freq: Once | INTRAVENOUS | Status: AC
  Administered 2020-06-09: 500 [IU] via INTRAVENOUS

## 2020-06-09 MED ORDER — IOHEXOL 300 MG/ML  SOLN
100.0000 mL | Freq: Once | INTRAMUSCULAR | Status: AC | PRN
  Administered 2020-06-09: 100 mL via INTRAVENOUS

## 2020-06-09 MED ORDER — SODIUM CHLORIDE (PF) 0.9 % IJ SOLN
INTRAMUSCULAR | Status: AC
  Filled 2020-06-09: qty 50

## 2020-06-09 MED ORDER — HEPARIN SOD (PORK) LOCK FLUSH 100 UNIT/ML IV SOLN
INTRAVENOUS | Status: AC
  Filled 2020-06-09: qty 5

## 2020-06-09 NOTE — Progress Notes (Signed)
6 MONTH FOLLOW UP  Assessment and Plan:  Atherosclerosis of aorta (HCC) Control blood pressure, cholesterol, glucose, increase exercise.   Systolic CHF ([place]) Weight up but appears euvolemic,  Dr. Missy Sabins following closely Emphasized salt restriction, less than 2000mg  a day. Encouraged daily monitoring of the patient's weight, call office if 5 lb weight loss or gain in a day.  Encouraged regular exercise. If any increasing shortness of breath, swelling, or chest pressure go to ER immediately.  decrease your fluid intake to less than 2 L daily please remember to always increase your potassium intake with any increase of your fluid pill.   Other specified hypothyroidism Hypothyroidism-check TSH level, continue medications the same, reminded to take on an empty stomach 30-79mins before food.  - TSH  Hyperlipidemia -continue supplements, check lipids, decrease fatty foods, increase activity.  - Lipid panel   Vitamin D deficiency - start 5000 IU daily  - goal 60-100;  - vitamin D  Morbid Obesity (BMI 35+ with comorbidities) -diet and exercise discussed    Medication management - Magnesium  Prediabetes - Hemoglobin A1c  Discussed diet/exercise, weight management  Check A1C q48m; CMP for glucose otherwise  Mantle Cell Lymphoma ([place]) Dr. Earlie Server following, chemotherapy   Discussed med's effects and SE's. Screening labs and tests as requested with regular follow-up as recommended. Future Appointments  Date Time Provider [place]  06/15/2020  8:15 AM Curt Bears, MD [facility] None    HPI  65 y.o. female  presents for an overdue follow up for htn, prediabetes, hyperlipidemia, hypothyroid, vitamin D def, morbid obesity.   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 Earlie Server, undergoing chemotherapy by port and getting regular labs through cancer center.   She has chronic nausea, known gallstones, has been  postponing surgery due to cancer/heart, is interested in pursuing. She will discuss with onc/cardiology and follow up with [place] if cleared to pursue at this time.   BMI is Body mass index is 36.76 kg/m., she has been working on diet, cut out red meat, pushing fruits and veggies and admits minimal exercise due to heart.  Wt Readings from Last 3 Encounters:  06/09/20 201 lb (91.2 kg)  05/04/20 195 lb 9.6 oz (88.7 kg)  12/30/19 191 lb 9.6 oz (86.9 kg)   She has remote history of palpations, but recent issues.  Systolic CHF following chemotherapy, Dr. Missy Sabins follows, on low dose coreg, losartan, spironolactone and PRN torsemide. Chronic exertional dyspnea unchanged. Recent ECHO 05/04/2020 with EF 50-55%.   Her blood pressure has been controlled at home, today their BP is BP: 102/72.  She does workout. She denies chest pain. Intermittently endorses dizziness with position changes. Admits inconsistent with hydration.   She has aortic atherosclerosis per CT 12/2018.   She is not on cholesterol medication and denies myalgias. Her cholesterol is not at goal. The cholesterol last visit was:  Lab Results  Component Value Date   CHOL 223 (H) 08/13/2018   HDL 53 08/13/2018   LDLCALC 139 (H) 08/13/2018   TRIG 173 (H) 08/13/2018   CHOLHDL 4.2 08/13/2018  . She has been working on diet and exercise for prediabetes,  and denies foot ulcerations, hyperglycemia, hypoglycemia , increased appetite, nausea, paresthesia of the feet, polydipsia, polyuria, visual disturbances, vomiting and weight loss. Last A1C in the office was:  Lab Results  Component Value Date   HGBA1C 5.7 (H) 02/02/2018   Patient is on Vitamin D supplement, 5000 IU but admits  doesn't take daily,  Lab Results  Component Value Date   VD25OH 29 (L) 01/29/2017     She is on thyroid medication. Her medication was not changed last visit.   Lab Results  Component Value Date   TSH 1.535 09/09/2019  .    Current Medications:  Current  Outpatient Medications on File Prior to Visit  Medication Sig Dispense Refill  . ARMOUR THYROID 90 MG tablet Take 90 mg by mouth every morning.    . carvedilol (COREG) 6.25 MG tablet Take 3.125 mg by mouth 2 (two) times daily with a meal.    . dapagliflozin propanediol (FARXIGA) 10 MG TABS tablet Take 1 tablet (10 mg total) by mouth daily before breakfast. 30 tablet 11  . estradiol (VIVELLE-DOT) 0.075 MG/24HR Place 1 patch onto the skin 2 (two) times a week.     . losartan (COZAAR) 25 MG tablet Take 12.5 mg by mouth 2 (two) times daily.    . Potassium Chloride ER 20 MEQ TBCR Take 1 tablet by mouth as needed. Take with Torsemide 90 tablet 3  . spironolactone (ALDACTONE) 25 MG tablet TAKE 1 TABLET BY MOUTH EVERY DAY 90 tablet 3  . torsemide (DEMADEX) 20 MG tablet Take 20 mg by mouth daily as needed.     Current Facility-Administered Medications on File Prior to Visit  Medication Dose Route Frequency Provider Last Rate Last Admin  . heparin lock flush 100 UNIT/ML injection           . sodium chloride (PF) 0.9 % injection            Medical History:  Past Medical History:  Diagnosis Date  . Allergy   . Cancer [facility])    being worked up for [place]  . CHF (congestive heart failure) ([place])   . Colon polyp 12/29/2006  . 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 Hyperlipidemia   . Hypothyroidism   . Low kidney function   . Obesity    BMI 34  . Osteoporosis   . Palpitations   . Vitamin D deficiency    Allergies Allergies  Allergen Reactions  . Ppd [Tuberculin Purified Protein Derivative]     Positive reaction.  Negative Chest Xray 06/16/12    SURGICAL HISTORY She  has a past surgical history that includes Abdominal hysterectomy (1997); Cesarean section (1983); Colonoscopy w/ biopsies (12/29/2006); laparoscopy; Upper gastrointestinal endoscopy; IR IMAGING GUIDED PORT INSERTION (01/15/2019); and RIGHT/LEFT HEART CATH AND CORONARY ANGIOGRAPHY (N/A, 06/25/2019). FAMILY HISTORY Her family history includes  Alcohol abuse in her father; Bone cancer in her maternal aunt; Breast cancer in her maternal aunt; Breast cancer (age of onset: 42) in her niece; Breast cancer (age of onset: 85) in her sister; Cancer (age of onset: 88) in her mother; Diabetes in her brother, mother, paternal grandmother, and sister; Hashimoto's thyroiditis in her sister; Heart disease in her father and maternal aunt; Heart disease (age of onset: 74) in her mother; Hyperlipidemia in her brother and maternal aunt; Hypertension in her brother, father, and mother; Hypothyroidism in her brother and sister; Liver disease in her maternal grandmother and mother; Thyroid disease in her mother. SOCIAL HISTORY She  reports that she quit smoking about 24 years ago. Her smoking use included cigarettes. She quit after 15.00 years of use. She has never used smokeless tobacco. She reports that she does not drink alcohol and does not use drugs.   Review of Systems: Review of Systems  Constitutional: Negative for chills, fever, malaise/fatigue and weight loss.  HENT: Negative for congestion, ear pain and sore throat.   Eyes: Negative.   Respiratory: Negative for cough, shortness of breath (exertional, chronic following chemo) and wheezing.   Cardiovascular: Negative for chest pain, palpitations and leg swelling.  Gastrointestinal: Positive for nausea (known gallstones, postponing surgery with cancer). Negative for blood in stool, constipation, diarrhea, heartburn, melena and vomiting.  Genitourinary: Negative.   Musculoskeletal: Negative for joint pain, myalgias and neck pain.  Skin: Negative for itching.  Neurological: Negative for dizziness, tingling, sensory change, loss of consciousness and headaches.  Psychiatric/Behavioral: Negative for depression. The patient is not nervous/anxious and does not have insomnia.     Physical Exam: Estimated body mass index is 36.76 kg/m as calculated from the following:   Height as of this encounter: 5\' 2"   (1.575 m).   Weight as of this encounter: 201 lb (91.2 kg). BP 102/72   Pulse 95   Temp (!) 97.5 F (36.4 C)   Ht 5\' 2"  (1.575 m)   Wt 201 lb (91.2 kg)   SpO2 97%   BMI 36.76 kg/m   General Appearance: Well nourished well developed, in no apparent distress.  Eyes: PERRLA, EOMs, conjunctiva no swelling or erythema ENT/Mouth: Ear canals normal without obstruction, swelling, erythema, or discharge.  TMs normal bilaterally with no erythema, bulging, retraction, or loss of landmark.  Oropharynx moist and clear with no exudate, erythema, or swelling.   Neck: Supple, thyroid normal. No bruits.  No cervical adenopathy Respiratory: Respiratory effort normal, Breath sounds clear A&P without wheeze, rhonchi, rales.   Cardio: RRR without murmurs, rubs or gallops, occ extra beat. Brisk peripheral pulses without edema.  Chest: symmetric, with normal excursions Abdomen: Morbidly obese, Soft, nontender, no guarding, rebound, hernias, masses, or organomegaly.  Lymphatics: Non tender without lymphadenopathy.  Musculoskeletal: Full ROM all peripheral extremities,5/5 strength, and normal gait.  Skin: Warm, dry without rashes, lesions, ecchymosis. Neuro: Awake and oriented X 3, Cranial nerves intact, reflexes equal bilaterally. Normal muscle tone, no cerebellar symptoms. Sensation intact.  Psych:  normal affect, Insight and Judgment appropriate.    Izora Ribas 9:51 AM [facility]

## 2020-06-10 ENCOUNTER — Other Ambulatory Visit: Payer: Self-pay | Admitting: Adult Health

## 2020-06-10 LAB — LIPID PANEL
Cholesterol: 214 mg/dL — ABNORMAL HIGH (ref <200)
HDL: 52 mg/dL (ref >=50)
LDL Cholesterol (Calc): 140 mg/dL (calc) — ABNORMAL HIGH
Non-HDL Cholesterol (Calc): 162 mg/dL (calc) — ABNORMAL HIGH (ref <130)
Total CHOL/HDL Ratio: 4.1 (calc) (ref <5.0)
Triglycerides: 108 mg/dL (ref <150)

## 2020-06-10 LAB — VITAMIN D 25 HYDROXY (VIT D DEFICIENCY, FRACTURES): Vit D, 25-Hydroxy: 28 ng/mL — ABNORMAL LOW (ref 30–100)

## 2020-06-10 LAB — TSH: TSH: 2.09 mIU/L (ref 0.40–4.50)

## 2020-06-10 LAB — HEMOGLOBIN A1C
Hgb A1c MFr Bld: 5.5 % of total Hgb (ref <5.7)
Mean Plasma Glucose: 111 mg/dL
eAG (mmol/L): 6.2 mmol/L

## 2020-06-10 LAB — MAGNESIUM: Magnesium: 2 mg/dL (ref 1.5–2.5)

## 2020-06-10 MED ORDER — CHOLECALCIFEROL 125 MCG (5000 UT) PO CAPS: 10000.0000 [IU] | ORAL_CAPSULE | Freq: Every day | ORAL | 0 refills | Status: DC

## 2020-06-12 ENCOUNTER — Ambulatory Visit: Payer: BC Managed Care – PPO | Admitting: Internal Medicine

## 2020-06-15 ENCOUNTER — Other Ambulatory Visit: Payer: Self-pay

## 2020-06-15 ENCOUNTER — Telehealth: Payer: Self-pay | Admitting: Pharmacist

## 2020-06-15 ENCOUNTER — Encounter: Payer: Self-pay | Admitting: Internal Medicine

## 2020-06-15 ENCOUNTER — Inpatient Hospital Stay (HOSPITAL_BASED_OUTPATIENT_CLINIC_OR_DEPARTMENT_OTHER): Payer: BC Managed Care – PPO | Admitting: Internal Medicine

## 2020-06-15 ENCOUNTER — Other Ambulatory Visit (HOSPITAL_COMMUNITY): Payer: Self-pay

## 2020-06-15 VITALS — BP 101/45 | HR 98 | Temp 95.7°F | Resp 19 | Ht 62.0 in | Wt 201.1 lb

## 2020-06-15 DIAGNOSIS — Z5111 Encounter for antineoplastic chemotherapy: Secondary | ICD-10-CM

## 2020-06-15 DIAGNOSIS — I509 Heart failure, unspecified: Secondary | ICD-10-CM | POA: Diagnosis not present

## 2020-06-15 DIAGNOSIS — C8313 Mantle cell lymphoma, intra-abdominal lymph nodes: Secondary | ICD-10-CM | POA: Diagnosis not present

## 2020-06-15 DIAGNOSIS — C8318 Mantle cell lymphoma, lymph nodes of multiple sites: Secondary | ICD-10-CM | POA: Diagnosis not present

## 2020-06-15 DIAGNOSIS — E039 Hypothyroidism, unspecified: Secondary | ICD-10-CM | POA: Diagnosis not present

## 2020-06-15 DIAGNOSIS — R0989 Other specified symptoms and signs involving the circulatory and respiratory systems: Secondary | ICD-10-CM | POA: Diagnosis not present

## 2020-06-15 MED ORDER — ACALABRUTINIB 100 MG PO CAPS
100.0000 mg | ORAL_CAPSULE | Freq: Two times a day (BID) | ORAL | 3 refills | Status: DC
  Filled 2020-06-15 (×2): qty 60, 30d supply, fill #0
  Filled 2020-07-05: qty 60, 30d supply, fill #1
  Filled 2020-08-07: qty 60, 30d supply, fill #2
  Filled 2020-09-14: qty 60, 30d supply, fill #3

## 2020-06-15 NOTE — Telephone Encounter (Signed)
Oral Chemotherapy Pharmacist Encounter  I spoke with patient for overview of: Calquence (acalabrutinib) for the treatment of recurrent Mantle Cell Lymphoma, planned duration until disease progression or unacceptable toxicity.   Counseled patient on administration, dosing, side effects, monitoring, drug-food interactions, safe handling, storage, and disposal.  Patient will take Calquence 100mg  capsules, 1 capsule by mouth approximately 12 hours apart, with or with out food, with a glass of water.  Patient knows to avoid acid suppressing agents, if needed, may seperate use of an H2RA blocker or antacid by 2 hours from Calquence admisinstration.  Calquence start date: 06/17/20  Adverse effects include but are not limited to: headache, diarrhea, fatigue, rash, muscle pain, bruising, decreased blood counts, and altered cardiac conduction.    Reviewed with patient importance of keeping a medication schedule and plan for any missed doses. No barriers to medication adherence identified.  Medication reconciliation performed and medication/allergy list updated.  Insurance authorization for [employer] has been obtained. Test claim at the pharmacy revealed copayment $300 for 1st fill of Calquence. Copay card obtained for patient bringing out of pocket cost to $0.  This will ship from the [place] on 06/15/20 to deliver to patient's home on 06/16/20.  Patient informed the pharmacy will reach out 5-7 days prior to needing next fill of Calquence to coordinate continued medication acquisition to prevent break in therapy.  All questions answered.  Ms. Bark voiced understanding and appreciation.   Medication education handout placed in mail for patient. Patient knows to call the office with questions or concerns. Oral Chemotherapy Clinic phone number provided to patient.   Leron Croak, PharmD, BCPS Hematology/Oncology Clinical Pharmacist [facility] [phone] 06/15/2020 1:48 PM

## 2020-06-15 NOTE — Progress Notes (Signed)
[facility] Telephone:(336) [phone]   Fax:(336) [phone]  OFFICE PROGRESS NOTE  [name], MD [address]  [place] [address]  DIAGNOSIS: Stage IV non-Hodgkin's lymphoma, mantle cell lymphoma.  She presented with left iliac and retroperitoneal lymphadenopathy.  She also presented with left axillary lymphadenopathy and bone marrow involvement.  She was diagnosed in December 2020.   PRIOR THERAPY: Systemic chemotherapy with R-CHOP with Neulasta support.  First dose on 01/27/2019. Status post 6 cycles.    CURRENT THERAPY: Acalabrutinib 100 mg p.o. twice daily.  Expected to start in the next few days  INTERVAL HISTORY: Natalie Baker 65 y.o. female returns to the clinic today for follow-up visit accompanied by her daughter Lavella Lemons.  The patient is feeling fine today with no concerning complaints.  She denied having any current chest pain, shortness of breath, cough or hemoptysis.  She has occasional nausea but probably from her cardiac medication.  She denied having any vomiting, diarrhea or constipation.  She denied having any fever or chills.  She has no weight loss or night sweats.  She has no headache or visual changes.  She is here today for evaluation with repeat CT scan of the chest, abdomen and pelvis for restaging of her disease.  MEDICAL HISTORY: Past Medical History:  Diagnosis Date   Allergy    Cancer (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  MEDICATIONS:  Current Outpatient Medications  Medication Sig Dispense Refill   ARMOUR THYROID 90 MG tablet Take 1 tablet (90 mg total) by mouth every morning. 90 tablet 1   carvedilol  (COREG) 6.25 MG tablet Take 3.125 mg by mouth 2 (two) times daily with a meal.     Cholecalciferol 125 MCG (5000 UT) capsule Take 2 capsules (10,000 Units total) by mouth daily. 60 capsule 0   dapagliflozin propanediol (FARXIGA) 10 MG TABS tablet Take 1 tablet (10 mg total) by mouth daily before breakfast. 30 tablet 11   estradiol (VIVELLE-DOT) 0.075 MG/24HR Place 1 patch onto the skin 2 (two) times a week.      losartan (COZAAR) 25 MG tablet Take 12.5 mg by mouth 2 (two) times daily.     Potassium Chloride ER 20 MEQ TBCR Take 1 tablet by mouth as needed. Take with Torsemide 90 tablet 3   spironolactone (ALDACTONE) 25 MG tablet TAKE 1 TABLET BY MOUTH EVERY DAY 90 tablet 3   torsemide (DEMADEX) 20 MG tablet Take 20 mg by mouth daily as needed.     No current facility-administered medications for this visit.    SURGICAL HISTORY:  Past Surgical History:  Procedure Laterality Date   ABDOMINAL HYSTERECTOMY  1997   CESAREAN SECTION  1983   COLONOSCOPY W/ BIOPSIES  12/29/2006   IR IMAGING GUIDED PORT INSERTION  01/15/2019   LAPAROSCOPY     1991 for emdometriosis   RIGHT/LEFT HEART CATH AND CORONARY ANGIOGRAPHY N/A 06/25/2019   Procedure: RIGHT/LEFT HEART CATH AND CORONARY ANGIOGRAPHY;  Surgeon: Jolaine Artist, MD;  Location: [facility];  Service: Cardiovascular;  Laterality: N/A;  UPPER GASTROINTESTINAL ENDOSCOPY      REVIEW OF SYSTEMS:  Constitutional: negative Eyes: negative Ears, nose, mouth, throat, and face: negative Respiratory: negative Cardiovascular: negative Gastrointestinal: positive for nausea Genitourinary:negative Integument/breast: negative Hematologic/lymphatic: negative Musculoskeletal:negative Neurological: negative Behavioral/Psych: negative Endocrine: negative Allergic/Immunologic: negative   PHYSICAL EXAMINATION: General appearance: alert, cooperative, and no distress Head: Normocephalic, without obvious abnormality, atraumatic Neck: no adenopathy,  no JVD, supple, symmetrical, trachea midline, and thyroid not enlarged, symmetric, no tenderness/mass/nodules Lymph nodes: Cervical, supraclavicular, and axillary nodes normal. Resp: clear to auscultation bilaterally Back: symmetric, no curvature. ROM normal. No CVA tenderness. Cardio: regular rate and rhythm, S1, S2 normal, no murmur, click, rub or gallop GI: soft, non-tender; bowel sounds normal; no masses,  no organomegaly Extremities: extremities normal, atraumatic, no cyanosis or edema Neurologic: Alert and oriented X 3, normal strength and tone. Normal symmetric reflexes. Normal coordination and gait  ECOG PERFORMANCE STATUS: 1 - Symptomatic but completely ambulatory  Blood pressure (!) 101/45, pulse 98, temperature (!) 95.7 F (35.4 C), temperature source Tympanic, resp. rate 19, height 5\' 2"  (1.575 m), weight 201 lb 1.6 oz (91.2 kg), SpO2 98 %.  LABORATORY DATA: Lab Results  Component Value Date   WBC 6.7 06/09/2020   HGB 14.3 06/09/2020   HCT 44.0 06/09/2020   MCV 87.5 06/09/2020   PLT 237 06/09/2020      Chemistry      Component Value Date/Time   NA 138 06/09/2020 0745   K 4.5 06/09/2020 0745   CL 102 06/09/2020 0745   CO2 26 06/09/2020 0745   BUN 15 06/09/2020 0745   CREATININE 0.92 06/09/2020 0745   CREATININE 0.74 12/21/2018 1513      Component Value Date/Time   CALCIUM 9.3 06/09/2020 0745   ALKPHOS 78 06/09/2020 0745   AST 19 06/09/2020 0745   ALT 15 06/09/2020 0745   BILITOT 0.4 06/09/2020 0745       RADIOGRAPHIC STUDIES: CT Chest W Contrast  Result Date: 06/09/2020 CLINICAL DATA:  Primary Cancer Type: Lymphoma Imaging Indication: Routine surveillance Interval therapy since last imaging? No Initial Cancer Diagnosis Date: 01/06/2019; Established by: Biopsy-proven Detailed Pathology: Stage IV non-Hodgkin's, mantle cell lymphoma. Primary Tumor location: Presented with left iliac and retroperitoneal lymphadenopathy, left axillary lymphadenopathy and bone  marrow involvement. Surgeries: Hysterectomy. Chemotherapy: Yes; Ongoing? No; most recent administration: 05/13/2019 Immunotherapy? No Radiation therapy? No EXAM: CT CHEST, ABDOMEN, AND PELVIS WITH CONTRAST TECHNIQUE: Multidetector CT imaging of the chest, abdomen and pelvis was performed following the standard protocol during bolus administration of intravenous contrast. CONTRAST:  179mL OMNIPAQUE IOHEXOL 300 MG/ML  SOLN COMPARISON:  Most recent CT chest, abdomen and pelvis 12/10/2019. 06/10/2019 PET-CT. FINDINGS: CT CHEST FINDINGS Cardiovascular: Port in the anterior chest wall with tip in distal SVC. No significant vascular findings. Normal heart size. No pericardial effusion. Mediastinum/Nodes: Bilateral small prominent axial lymph nodes are again demonstrated and slightly increased in size. For example levin mm LEFT axillary node (image 15/series 2) increased from 6 mm. RIGHT axillary lymph node measuring 9 mm (image 17/2) increased from 5 mm. LEFT supraclavicular node measuring 18 mm (image 10) compares to 10 mm. No enlarged mediastinal or hilar nodes identified. Lungs/Pleura: 7 mm RIGHT middle lobe nodule (image 72/series 4) not changed from prior. No new pulmonary nodularity. Musculoskeletal: No aggressive osseous lesion. CT ABDOMEN AND PELVIS FINDINGS Hepatobiliary: Small enhancing lesion in the subcapsular lateral segment LEFT hepatic lobe (image 49) is present on more remote CT (03/29/2019 and favored benign vascular Pancreas: Pancreas is normal.  No ductal dilatation. No pancreatic inflammation. Spleen: Normal spleen.  Normal volume. Adrenals/urinary tract: Adrenal glands and kidneys are normal. The ureters and bladder normal. Stomach/Bowel: Stomach, small bowel, appendix, and cecum are normal. The colon and rectosigmoid colon are normal. Vascular/Lymphatic: Abdominal aorta normal caliber. Periaortic retroperitoneal lymph node adjacent to the LEFT renal vein measures 10 mm (image 62) compared to 5 mm.  Slightly more inferior LEFT periaortic lymph node measures 11 mm (image 68) compared with 8 mm. Within the pelvis, LEFT external iliac lymph node measures 25 mm increased from 10 mm (image 100). Within the RIGHT lower quadrant ileocolic mesentery interval increase in lymph node surrounding the vessels measuring 34 mm increased from 20 mm. Reproductive: Post hysterectomy.  Adnexa unremarkable Other: No free fluid. Musculoskeletal: No aggressive osseous lesion. IMPRESSION: Chest Impression: 1. Mild increase in small bilateral axillary lymph nodes. 2. Moderate increase in LEFT supraclavicular node. Abdomen / Pelvis Impression: 1. Mild increase in periaortic retroperitoneal nodes. 2. Moderate increase in size ileocolic mesenteric lymph node mass. 3. Moderate increase in LEFT external iliac node. 4. Normal spleen. 5. Overall findings consistent with mild to moderate progression of lymphoma. Electronically Signed   By: Suzy Bouchard M.D.   On: 06/09/2020 10:40   CT Abdomen Pelvis W Contrast  Result Date: 06/09/2020 CLINICAL DATA:  Primary Cancer Type: Lymphoma Imaging Indication: Routine surveillance Interval therapy since last imaging? No Initial Cancer Diagnosis Date: 01/06/2019; Established by: Biopsy-proven Detailed Pathology: Stage IV non-Hodgkin's, mantle cell lymphoma. Primary Tumor location: Presented with left iliac and retroperitoneal lymphadenopathy, left axillary lymphadenopathy and bone marrow involvement. Surgeries: Hysterectomy. Chemotherapy: Yes; Ongoing? No; most recent administration: 05/13/2019 Immunotherapy? No Radiation therapy? No EXAM: CT CHEST, ABDOMEN, AND PELVIS WITH CONTRAST TECHNIQUE: Multidetector CT imaging of the chest, abdomen and pelvis was performed following the standard protocol during bolus administration of intravenous contrast. CONTRAST:  177mL OMNIPAQUE IOHEXOL 300 MG/ML  SOLN COMPARISON:  Most recent CT chest, abdomen and pelvis 12/10/2019. 06/10/2019 PET-CT. FINDINGS: CT  CHEST FINDINGS Cardiovascular: Port in the anterior chest wall with tip in distal SVC. No significant vascular findings. Normal heart size. No pericardial effusion. Mediastinum/Nodes: Bilateral small prominent axial lymph nodes are again demonstrated and slightly increased in size. For example levin mm LEFT axillary node (image 15/series 2) increased from 6 mm. RIGHT axillary lymph node measuring 9 mm (image 17/2) increased from 5 mm. LEFT supraclavicular node measuring 18 mm (image 10) compares to 10 mm. No enlarged mediastinal or hilar nodes identified. Lungs/Pleura: 7 mm RIGHT middle lobe nodule (image 72/series 4) not changed from prior. No new pulmonary nodularity. Musculoskeletal: No aggressive osseous lesion. CT ABDOMEN AND PELVIS FINDINGS Hepatobiliary: Small enhancing lesion in the subcapsular lateral segment LEFT hepatic lobe (image 49) is present on more remote CT (03/29/2019 and favored benign vascular Pancreas: Pancreas is normal. No ductal dilatation. No pancreatic inflammation. Spleen: Normal spleen.  Normal volume. Adrenals/urinary tract: Adrenal glands and kidneys are normal. The ureters and bladder normal. Stomach/Bowel: Stomach, small bowel, appendix, and cecum are normal. The colon and rectosigmoid colon are normal. Vascular/Lymphatic: Abdominal aorta normal caliber. Periaortic retroperitoneal lymph node adjacent to the LEFT renal vein measures 10 mm (image 62) compared to 5 mm. Slightly more inferior LEFT periaortic lymph node measures 11 mm (image 68) compared with 8 mm. Within the pelvis, LEFT external iliac lymph node measures 25 mm increased from 10 mm (image 100). Within the RIGHT lower quadrant ileocolic mesentery interval increase in lymph node surrounding the vessels measuring 34 mm  increased from 20 mm. Reproductive: Post hysterectomy.  Adnexa unremarkable Other: No free fluid. Musculoskeletal: No aggressive osseous lesion. IMPRESSION: Chest Impression: 1. Mild increase in small  bilateral axillary lymph nodes. 2. Moderate increase in LEFT supraclavicular node. Abdomen / Pelvis Impression: 1. Mild increase in periaortic retroperitoneal nodes. 2. Moderate increase in size ileocolic mesenteric lymph node mass. 3. Moderate increase in LEFT external iliac node. 4. Normal spleen. 5. Overall findings consistent with mild to moderate progression of lymphoma. Electronically Signed   By: Suzy Bouchard M.D.   On: 06/09/2020 10:40     ASSESSMENT AND PLAN: This is a very pleasant 65 years old white female recently diagnosed with stage IV less aggressive mantle cell lymphoma presented with left iliac and retroperitoneal lymphadenopathy as well as left axillary and bone marrow involvement in December 2020. The patient completed 6 cycles of systemic chemotherapy with R-CHOP.  She tolerated her treatment fairly well with no concerning adverse effects but few months later she developed congestive heart failure with drop in her ejection fraction to the 20-25%.  This has improved over the last few months and currently around 55%.  She is followed by Dr. Haroldine Laws. The patient had repeat CT scan of the chest, abdomen pelvis performed recently.  I personally and independently reviewed the scan images and discussed the results with the patient and her daughter. Unfortunately her scan showed evidence for disease progression with mild increase in the small bilateral axillary lymph nodes and moderate increase in the left supraclavicular as well as ileocolic mesenteric lymph nodes and left external iliac lymph nodes but mild increase in the periaortic retroperitoneal lymph nodes. I explained to the patient that this is an evidence for disease recurrence and progression. I discussed with her several options for management of her condition including treatment with acalabrutinib 100 mg p.o. twice daily.  We discussed with the patient the adverse effect of this treatment and she will see the pharmacist for  oral oncolytic for more education and to help the patient getting her medication approved. In the meantime I will refer the patient to the stem cell transplant team at [facility] for evaluation and consideration of autologous stem cell transplant in the future. I will see her back for follow-up visit in around 3 weeks for evaluation and repeat blood work. She was advised to call immediately if she has any concerning symptoms in the interval. The patient voices understanding of current disease status and treatment options and is in agreement with the current care plan.  All questions were answered. The patient knows to call the clinic with any problems, questions or concerns. We can certainly see the patient much sooner if necessary.  Disclaimer: This note was dictated with voice recognition software. Similar sounding words can inadvertently be transcribed and may not be corrected upon review.

## 2020-06-15 NOTE — Telephone Encounter (Addendum)
Oral Oncology Pharmacist Encounter  Prior Authorization for Calquence (acalabrutinib) has been approved.    Effective dates: 06/15/20 through 06/14/21  Patient's copay is $300  Obtained patient copay card through [facility] bringing patient's out of pocket cost to $0  RxBIN: [id number] ID #: [id number] PCN #: [id number] Group #: [id number] Expires: 01/06/2021  Copay card information shared with the [facility] for billing.   Oral Oncology Clinic will continue to follow.   Leron Croak, PharmD, BCPS Hematology/Oncology Clinical Pharmacist [facility] [phone] 06/15/2020 9:56 AM

## 2020-06-15 NOTE — Telephone Encounter (Signed)
Oral Oncology Pharmacist Encounter  Received notification from [facility] that prior authorization for Calquence (acalabrutinib) is required.   PA submitted on CoverMyMeds Key [id number] Status is pending   Oral Oncology Clinic will continue to follow.  Leron Croak, PharmD, BCPS Hematology/Oncology Clinical Pharmacist [facility] [phone] 06/15/2020 9:52 AM

## 2020-06-15 NOTE — Progress Notes (Signed)
DISCONTINUE OFF PATHWAY REGIMEN - Lymphoma and CLL   OFF02101:R-CHOP q21 Days:   A cycle is every 21 days:     Prednisone      Rituximab-xxxx      Cyclophosphamide      Doxorubicin      Vincristine   **Always confirm dose/schedule in your pharmacy ordering system**  REASON: Disease Progression PRIOR TREATMENT: Off Pathway: R-CHOP q21 Days TREATMENT RESPONSE: Complete Response (CR)  START ON PATHWAY REGIMEN - Lymphoma and CLL     A cycle is every 28 days:     Acalabrutinib   **Always confirm dose/schedule in your pharmacy ordering system**  Patient Characteristics: Mantle Cell Lymphoma, Second Line Disease Type: Not Applicable Disease Type: Mantle Cell Lymphoma Disease Type: Not Applicable Line of Therapy: Second Line Was TP53 Mutation Testing Completed<= No, TP53 Mutation Testing Not Completed Intent of Therapy: Curative Intent, Discussed with Patient

## 2020-06-15 NOTE — Telephone Encounter (Signed)
Oral Oncology Pharmacist Encounter  Received new prescription for Calquence (acalabrutinib) for the treatment of recurrent Mantle cell lymphoma, planned duration until disease progression or unacceptable drug toxicity.  Prescription dose and frequency assessed for appropriateness. Appropriate for therapy initiation.   CMP and CBC w/ Diff from 06/09/20 assessed, no baseline dose adjustments required.  Current medication list in Epic reviewed, no relevant/significant DDIs with Calquence identified.  Evaluated chart and no patient barriers to medication adherence noted.   Patient agreement for treatment documented in MD note on 06/15/20.  Prescription has been e-scribed to the [facility] for benefits analysis and approval.  Oral Oncology Clinic will continue to follow for insurance authorization, copayment issues, initial counseling and start date.  Leron Croak, PharmD, BCPS Hematology/Oncology Clinical Pharmacist [facility] [phone] 06/15/2020 9:46 AM

## 2020-06-16 ENCOUNTER — Telehealth: Payer: Self-pay | Admitting: Medical Oncology

## 2020-06-16 NOTE — Telephone Encounter (Signed)
Faxed referral records to [facility] and called  referral.

## 2020-06-16 NOTE — Telephone Encounter (Signed)
BMT referral made to [facility] for autologous consideration.  Called referral to Otila Kluver and she is faxing a referral form .

## 2020-06-19 ENCOUNTER — Telehealth: Payer: Self-pay | Admitting: Medical Oncology

## 2020-06-19 NOTE — Telephone Encounter (Signed)
Pt appt BMT [facility] July 6th at 1000 with Dr Laverle Hobby, MD

## 2020-06-20 ENCOUNTER — Telehealth: Payer: Self-pay | Admitting: Internal Medicine

## 2020-06-20 NOTE — Telephone Encounter (Signed)
Scheduled per los. Called and spoke with patient. Confirmed appt 

## 2020-06-21 DIAGNOSIS — C8310 Mantle cell lymphoma, unspecified site: Secondary | ICD-10-CM | POA: Diagnosis not present

## 2020-07-05 ENCOUNTER — Other Ambulatory Visit (HOSPITAL_COMMUNITY): Payer: Self-pay

## 2020-07-05 ENCOUNTER — Inpatient Hospital Stay: Payer: BC Managed Care – PPO | Admitting: Internal Medicine

## 2020-07-05 ENCOUNTER — Other Ambulatory Visit: Payer: Self-pay

## 2020-07-05 ENCOUNTER — Inpatient Hospital Stay: Payer: BC Managed Care – PPO

## 2020-07-05 VITALS — BP 111/70 | HR 94 | Temp 97.4°F | Resp 20 | Ht 62.0 in | Wt 202.5 lb

## 2020-07-05 DIAGNOSIS — C8313 Mantle cell lymphoma, intra-abdominal lymph nodes: Secondary | ICD-10-CM

## 2020-07-05 DIAGNOSIS — E039 Hypothyroidism, unspecified: Secondary | ICD-10-CM | POA: Diagnosis not present

## 2020-07-05 DIAGNOSIS — I509 Heart failure, unspecified: Secondary | ICD-10-CM | POA: Diagnosis not present

## 2020-07-05 DIAGNOSIS — Z5111 Encounter for antineoplastic chemotherapy: Secondary | ICD-10-CM

## 2020-07-05 DIAGNOSIS — C8318 Mantle cell lymphoma, lymph nodes of multiple sites: Secondary | ICD-10-CM | POA: Diagnosis not present

## 2020-07-05 LAB — CMP (CANCER CENTER ONLY)
ALT: 17 U/L (ref 0–44)
AST: 16 U/L (ref 15–41)
Albumin: 3.6 g/dL (ref 3.5–5.0)
Alkaline Phosphatase: 82 U/L (ref 38–126)
Anion gap: 10 (ref 5–15)
BUN: 18 mg/dL (ref 8–23)
CO2: 24 mmol/L (ref 22–32)
Calcium: 9.3 mg/dL (ref 8.9–10.3)
Chloride: 107 mmol/L (ref 98–111)
Creatinine: 0.97 mg/dL (ref 0.44–1.00)
GFR, Estimated: >60 mL/min (ref >60)
Glucose, Bld: 88 mg/dL (ref 70–99)
Potassium: 4.3 mmol/L (ref 3.5–5.1)
Sodium: 141 mmol/L (ref 135–145)
Total Bilirubin: 0.3 mg/dL (ref 0.3–1.2)
Total Protein: 7 g/dL (ref 6.5–8.1)

## 2020-07-05 LAB — CBC WITH DIFFERENTIAL (CANCER CENTER ONLY)
Abs Immature Granulocytes: 0.06 10*3/uL (ref 0.00–0.07)
Basophils Absolute: 0.1 10*3/uL (ref 0.0–0.1)
Basophils Relative: 1 %
Eosinophils Absolute: 0.1 10*3/uL (ref 0.0–0.5)
Eosinophils Relative: 1 %
HCT: 44.5 % (ref 36.0–46.0)
Hemoglobin: 14.4 g/dL (ref 12.0–15.0)
Immature Granulocytes: 1 %
Lymphocytes Relative: 23 %
Lymphs Abs: 1.6 10*3/uL (ref 0.7–4.0)
MCH: 28.4 pg (ref 26.0–34.0)
MCHC: 32.4 g/dL (ref 30.0–36.0)
MCV: 87.8 fL (ref 80.0–100.0)
Monocytes Absolute: 0.7 10*3/uL (ref 0.1–1.0)
Monocytes Relative: 9 %
Neutro Abs: 4.6 10*3/uL (ref 1.7–7.7)
Neutrophils Relative %: 65 %
Platelet Count: 210 10*3/uL (ref 150–400)
RBC: 5.07 MIL/uL (ref 3.87–5.11)
RDW: 15.2 % (ref 11.5–15.5)
WBC Count: 7 10*3/uL (ref 4.0–10.5)
nRBC: 0 % (ref 0.0–0.2)

## 2020-07-05 LAB — URIC ACID: Uric Acid, Serum: 4.1 mg/dL (ref 2.5–7.1)

## 2020-07-05 LAB — LACTATE DEHYDROGENASE: LDH: 138 U/L (ref 98–192)

## 2020-07-05 NOTE — Progress Notes (Signed)
[name] [phone]) [phone]   [phone]  OFFICE PROGRESS NOTE  [name], MD [address] [place] [address]  DIAGNOSIS: Stage IV non-Hodgkin's lymphoma, mantle cell lymphoma.  She presented with left iliac and retroperitoneal lymphadenopathy.  She also presented with left axillary lymphadenopathy and bone marrow involvement.  She was diagnosed in December 2020.   PRIOR THERAPY: Systemic chemotherapy with R-CHOP with Neulasta support.  First dose on 01/27/2019. Status post 6 cycles.    CURRENT THERAPY: Acalabrutinib 100 mg p.o. twice daily.  First dose started June 17, 2020.  INTERVAL HISTORY: Natalie Baker 65 y.o. female returns to the clinic today for follow-up visit.  The patient started her treatment with acalabrutinib 2 weeks ago.  She has been tolerating her treatment well except for initial fatigue and mild headache as well as few episodes of nausea resolved.  She denied having any recent weight loss or night sweats.  She has no chest pain, shortness of breath, cough or hemoptysis.  She denied having any fever or chills.  She has no current headache or visual changes.  She has no current nausea, vomiting, diarrhea or constipation.  She is here today for evaluation and repeat blood work.  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  MEDICATIONS:  Current Outpatient Medications  Medication Sig Dispense Refill   acalabrutinib (CALQUENCE) 100 MG capsule Take 1 capsule (100 mg total) by mouth 2 (two) times daily. 60 capsule 3   ARMOUR  THYROID 90 MG tablet Take 1 tablet (90 mg total) by mouth every morning. 90 tablet 1   carvedilol (COREG) 6.25 MG tablet Take 3.125 mg by mouth 2 (two) times daily with a meal.     Cholecalciferol 125 MCG (5000 UT) capsule Take 2 capsules (10,000 Units total) by mouth daily. 60 capsule 0   dapagliflozin propanediol (FARXIGA) 10 MG TABS tablet Take 1 tablet (10 mg total) by mouth daily before breakfast. 30 tablet 11   estradiol (VIVELLE-DOT) 0.075 MG/24HR Place 1 patch onto the skin 2 (two) times a week.      losartan (COZAAR) 25 MG tablet Take 12.5 mg by mouth 2 (two) times daily.     Potassium Chloride ER 20 MEQ TBCR Take 1 tablet by mouth as needed. Take with Torsemide 90 tablet 3   spironolactone (ALDACTONE) 25 MG tablet TAKE 1 TABLET BY MOUTH EVERY DAY 90 tablet 3   torsemide (DEMADEX) 20 MG tablet Take 20 mg by mouth daily as needed.     No current facility-administered medications for this visit.    SURGICAL HISTORY:  Past Surgical History:  Procedure Laterality Date   ABDOMINAL HYSTERECTOMY  1997   CESAREAN SECTION  1983   COLONOSCOPY W/ BIOPSIES  12/29/2006   IR IMAGING GUIDED PORT INSERTION  01/15/2019   LAPAROSCOPY     1991 for emdometriosis   RIGHT/LEFT HEART CATH AND CORONARY ANGIOGRAPHY N/A 06/25/2019   Procedure: RIGHT/LEFT HEART CATH AND CORONARY ANGIOGRAPHY;  Surgeon: Jolaine Artist, MD;  Location: [facility];  Service: Cardiovascular;  Laterality: N/A;   UPPER GASTROINTESTINAL ENDOSCOPY      REVIEW OF SYSTEMS:  A comprehensive review of systems was negative.   PHYSICAL EXAMINATION: General appearance: alert, cooperative, and no distress Head: Normocephalic, without obvious abnormality, atraumatic Neck: no adenopathy, no JVD, supple, symmetrical, trachea midline, and thyroid not enlarged, symmetric, no tenderness/mass/nodules Lymph nodes: Cervical, supraclavicular, and axillary nodes normal. Resp: clear to auscultation bilaterally Back: symmetric, no  curvature. ROM normal. No CVA tenderness. Cardio: regular rate and rhythm, S1, S2 normal, no murmur, click, rub or gallop GI: soft, non-tender; bowel sounds normal; no masses,  no organomegaly Extremities: extremities normal, atraumatic, no cyanosis or edema  ECOG PERFORMANCE STATUS: 1 - Symptomatic but completely ambulatory  Blood pressure 111/70, pulse 94, temperature (!) 97.4 F (36.3 C), temperature source Tympanic, resp. rate 20, height 5\' 2"  (1.575 m), weight 202 lb 8 oz (91.9 kg), SpO2 99 %.  LABORATORY DATA: Lab Results  Component Value Date   WBC 7.0 07/05/2020   HGB 14.4 07/05/2020   HCT 44.5 07/05/2020   MCV 87.8 07/05/2020   PLT 210 07/05/2020      Chemistry      Component Value Date/Time   NA 138 06/09/2020 0745   K 4.5 06/09/2020 0745   CL 102 06/09/2020 0745   CO2 26 06/09/2020 0745   BUN 15 06/09/2020 0745   CREATININE 0.92 06/09/2020 0745   CREATININE 0.74 12/21/2018 1513      Component Value Date/Time   CALCIUM 9.3 06/09/2020 0745   ALKPHOS 78 06/09/2020 0745   AST 19 06/09/2020 0745   ALT 15 06/09/2020 0745   BILITOT 0.4 06/09/2020 0745       RADIOGRAPHIC STUDIES: CT Chest W Contrast  Result Date: 06/09/2020 CLINICAL DATA:  Primary Cancer Type: Lymphoma Imaging Indication: Routine surveillance Interval therapy since last imaging? No Initial Cancer Diagnosis Date: 01/06/2019; Established by: Biopsy-proven Detailed Pathology: Stage IV non-Hodgkin's, mantle cell lymphoma. Primary Tumor location: Presented with left iliac and retroperitoneal lymphadenopathy, left axillary lymphadenopathy and bone marrow involvement. Surgeries: Hysterectomy. Chemotherapy: Yes; Ongoing? No; most recent administration: 05/13/2019 Immunotherapy? No Radiation therapy? No EXAM: CT CHEST, ABDOMEN, AND PELVIS WITH CONTRAST TECHNIQUE: Multidetector CT imaging of the chest, abdomen and pelvis was performed following the standard protocol during bolus administration of intravenous  contrast. CONTRAST:  166mL OMNIPAQUE IOHEXOL 300 MG/ML  SOLN COMPARISON:  Most recent CT chest, abdomen and pelvis 12/10/2019. 06/10/2019 PET-CT. FINDINGS: CT CHEST FINDINGS Cardiovascular: Port in the anterior chest wall with tip in distal SVC. No significant vascular findings. Normal heart size. No pericardial effusion. Mediastinum/Nodes: Bilateral small prominent axial lymph nodes are again demonstrated and slightly increased in size. For example levin mm LEFT axillary node (image 15/series 2) increased from 6 mm. RIGHT axillary lymph node measuring 9 mm (image 17/2) increased from 5 mm. LEFT supraclavicular node measuring 18 mm (image 10) compares to 10 mm. No enlarged mediastinal or hilar nodes identified. Lungs/Pleura: 7 mm RIGHT middle lobe nodule (image 72/series 4) not changed from prior. No new pulmonary nodularity. Musculoskeletal: No aggressive osseous lesion. CT ABDOMEN AND PELVIS FINDINGS Hepatobiliary: Small enhancing lesion in the subcapsular lateral segment LEFT hepatic lobe (image 49) is present on more remote CT (03/29/2019 and favored benign vascular Pancreas: Pancreas is normal. No ductal dilatation. No pancreatic inflammation. Spleen: Normal spleen.  Normal volume. Adrenals/urinary tract: Adrenal glands and kidneys are normal. The ureters and bladder normal.  Stomach/Bowel: Stomach, small bowel, appendix, and cecum are normal. The colon and rectosigmoid colon are normal. Vascular/Lymphatic: Abdominal aorta normal caliber. Periaortic retroperitoneal lymph node adjacent to the LEFT renal vein measures 10 mm (image 62) compared to 5 mm. Slightly more inferior LEFT periaortic lymph node measures 11 mm (image 68) compared with 8 mm. Within the pelvis, LEFT external iliac lymph node measures 25 mm increased from 10 mm (image 100). Within the RIGHT lower quadrant ileocolic mesentery interval increase in lymph node surrounding the vessels measuring 34 mm increased from 20 mm. Reproductive: Post  hysterectomy.  Adnexa unremarkable Other: No free fluid. Musculoskeletal: No aggressive osseous lesion. IMPRESSION: Chest Impression: 1. Mild increase in small bilateral axillary lymph nodes. 2. Moderate increase in LEFT supraclavicular node. Abdomen / Pelvis Impression: 1. Mild increase in periaortic retroperitoneal nodes. 2. Moderate increase in size ileocolic mesenteric lymph node mass. 3. Moderate increase in LEFT external iliac node. 4. Normal spleen. 5. Overall findings consistent with mild to moderate progression of lymphoma. Electronically Signed   By: Suzy Bouchard M.D.   On: 06/09/2020 10:40   CT Abdomen Pelvis W Contrast  Result Date: 06/09/2020 CLINICAL DATA:  Primary Cancer Type: Lymphoma Imaging Indication: Routine surveillance Interval therapy since last imaging? No Initial Cancer Diagnosis Date: 01/06/2019; Established by: Biopsy-proven Detailed Pathology: Stage IV non-Hodgkin's, mantle cell lymphoma. Primary Tumor location: Presented with left iliac and retroperitoneal lymphadenopathy, left axillary lymphadenopathy and bone marrow involvement. Surgeries: Hysterectomy. Chemotherapy: Yes; Ongoing? No; most recent administration: 05/13/2019 Immunotherapy? No Radiation therapy? No EXAM: CT CHEST, ABDOMEN, AND PELVIS WITH CONTRAST TECHNIQUE: Multidetector CT imaging of the chest, abdomen and pelvis was performed following the standard protocol during bolus administration of intravenous contrast. CONTRAST:  156mL OMNIPAQUE IOHEXOL 300 MG/ML  SOLN COMPARISON:  Most recent CT chest, abdomen and pelvis 12/10/2019. 06/10/2019 PET-CT. FINDINGS: CT CHEST FINDINGS Cardiovascular: Port in the anterior chest wall with tip in distal SVC. No significant vascular findings. Normal heart size. No pericardial effusion. Mediastinum/Nodes: Bilateral small prominent axial lymph nodes are again demonstrated and slightly increased in size. For example levin mm LEFT axillary node (image 15/series 2) increased from 6 mm.  RIGHT axillary lymph node measuring 9 mm (image 17/2) increased from 5 mm. LEFT supraclavicular node measuring 18 mm (image 10) compares to 10 mm. No enlarged mediastinal or hilar nodes identified. Lungs/Pleura: 7 mm RIGHT middle lobe nodule (image 72/series 4) not changed from prior. No new pulmonary nodularity. Musculoskeletal: No aggressive osseous lesion. CT ABDOMEN AND PELVIS FINDINGS Hepatobiliary: Small enhancing lesion in the subcapsular lateral segment LEFT hepatic lobe (image 49) is present on more remote CT (03/29/2019 and favored benign vascular Pancreas: Pancreas is normal. No ductal dilatation. No pancreatic inflammation. Spleen: Normal spleen.  Normal volume. Adrenals/urinary tract: Adrenal glands and kidneys are normal. The ureters and bladder normal. Stomach/Bowel: Stomach, small bowel, appendix, and cecum are normal. The colon and rectosigmoid colon are normal. Vascular/Lymphatic: Abdominal aorta normal caliber. Periaortic retroperitoneal lymph node adjacent to the LEFT renal vein measures 10 mm (image 62) compared to 5 mm. Slightly more inferior LEFT periaortic lymph node measures 11 mm (image 68) compared with 8 mm. Within the pelvis, LEFT external iliac lymph node measures 25 mm increased from 10 mm (image 100). Within the RIGHT lower quadrant ileocolic mesentery interval increase in lymph node surrounding the vessels measuring 34 mm increased from 20 mm. Reproductive: Post hysterectomy.  Adnexa unremarkable Other: No free fluid. Musculoskeletal: No aggressive osseous lesion. IMPRESSION: Chest Impression: 1. Mild increase  in small bilateral axillary lymph nodes. 2. Moderate increase in LEFT supraclavicular node. Abdomen / Pelvis Impression: 1. Mild increase in periaortic retroperitoneal nodes. 2. Moderate increase in size ileocolic mesenteric lymph node mass. 3. Moderate increase in LEFT external iliac node. 4. Normal spleen. 5. Overall findings consistent with mild to moderate progression of  lymphoma. Electronically Signed   By: Suzy Bouchard M.D.   On: 06/09/2020 10:40     ASSESSMENT AND PLAN: This is a very pleasant 65 years old white female recently diagnosed with stage IV less aggressive mantle cell lymphoma presented with left iliac and retroperitoneal lymphadenopathy as well as left axillary and bone marrow involvement in December 2020. The patient completed 6 cycles of systemic chemotherapy with R-CHOP.  She tolerated her treatment fairly well with no concerning adverse effects but few months later she developed congestive heart failure with drop in her ejection fraction to the 20-25%.  This has improved over the last few months and currently around 55%.  She is followed by Dr. Haroldine Laws. Unfortunately repeat CT scan in June 2022 showed evidence for disease progression with mild increase in the small bilateral axillary lymph nodes and moderate increase in the left supraclavicular as well as ileocolic mesenteric lymph nodes and left external iliac lymph nodes but mild increase in the periaortic retroperitoneal lymph nodes. The patient is started treatment with acalabrutinib 100 mg p.o. twice daily on June 17, 2020 and has been tolerating her treatment well. She is expected to see Dr. Laverle Hobby at [facility] on July 12, 2020 for discussion of potential autologous stem cell transplant. I recommended for the patient to continue her current treatment with acalabrutinib with the same dose. I will see her back for follow-up visit in 2 weeks for evaluation and management of any adverse effect of her treatment. She was advised to call immediately if she has any other concerning symptoms in the interval. The patient voices understanding of current disease status and treatment options and is in agreement with the current care plan.  All questions were answered. The patient knows to call the clinic with any problems, questions or concerns. We can certainly see the patient much  sooner if necessary.  Disclaimer: This note was dictated with voice recognition software. Similar sounding words can inadvertently be transcribed and may not be corrected upon review.

## 2020-07-07 ENCOUNTER — Telehealth: Payer: Self-pay | Admitting: Internal Medicine

## 2020-07-07 NOTE — Telephone Encounter (Signed)
Scheduled per los. Called and spoke with patient. Confirmed appt 

## 2020-07-11 ENCOUNTER — Encounter: Payer: Self-pay | Admitting: Internal Medicine

## 2020-07-11 ENCOUNTER — Other Ambulatory Visit (HOSPITAL_COMMUNITY): Payer: Self-pay

## 2020-07-12 DIAGNOSIS — E039 Hypothyroidism, unspecified: Secondary | ICD-10-CM | POA: Diagnosis not present

## 2020-07-12 DIAGNOSIS — F17210 Nicotine dependence, cigarettes, uncomplicated: Secondary | ICD-10-CM | POA: Diagnosis not present

## 2020-07-12 DIAGNOSIS — C8313 Mantle cell lymphoma, intra-abdominal lymph nodes: Secondary | ICD-10-CM | POA: Diagnosis not present

## 2020-07-12 DIAGNOSIS — Z8042 Family history of malignant neoplasm of prostate: Secondary | ICD-10-CM | POA: Diagnosis not present

## 2020-07-12 DIAGNOSIS — R21 Rash and other nonspecific skin eruption: Secondary | ICD-10-CM | POA: Diagnosis not present

## 2020-07-12 DIAGNOSIS — Z9221 Personal history of antineoplastic chemotherapy: Secondary | ICD-10-CM | POA: Diagnosis not present

## 2020-07-12 DIAGNOSIS — Z7682 Awaiting organ transplant status: Secondary | ICD-10-CM | POA: Diagnosis not present

## 2020-07-12 DIAGNOSIS — R197 Diarrhea, unspecified: Secondary | ICD-10-CM | POA: Diagnosis not present

## 2020-07-12 DIAGNOSIS — Z803 Family history of malignant neoplasm of breast: Secondary | ICD-10-CM | POA: Diagnosis not present

## 2020-07-12 DIAGNOSIS — T451X5A Adverse effect of antineoplastic and immunosuppressive drugs, initial encounter: Secondary | ICD-10-CM | POA: Diagnosis not present

## 2020-07-12 DIAGNOSIS — I509 Heart failure, unspecified: Secondary | ICD-10-CM | POA: Diagnosis not present

## 2020-07-12 DIAGNOSIS — K8020 Calculus of gallbladder without cholecystitis without obstruction: Secondary | ICD-10-CM | POA: Diagnosis not present

## 2020-07-12 DIAGNOSIS — E785 Hyperlipidemia, unspecified: Secondary | ICD-10-CM | POA: Diagnosis not present

## 2020-07-12 DIAGNOSIS — Z6836 Body mass index (BMI) 36.0-36.9, adult: Secondary | ICD-10-CM | POA: Diagnosis not present

## 2020-07-13 NOTE — Telephone Encounter (Signed)
Dr Haroldine Laws Before medical doctor refers to surgeon for gallbladder removal , will need cardiac clearance. Ok to proceed?

## 2020-07-24 ENCOUNTER — Inpatient Hospital Stay: Payer: BC Managed Care – PPO

## 2020-07-24 ENCOUNTER — Inpatient Hospital Stay: Payer: BC Managed Care – PPO | Attending: Physician Assistant | Admitting: Internal Medicine

## 2020-07-24 ENCOUNTER — Other Ambulatory Visit: Payer: Self-pay

## 2020-07-24 ENCOUNTER — Encounter: Payer: Self-pay | Admitting: Internal Medicine

## 2020-07-24 VITALS — BP 108/78 | HR 90 | Temp 97.4°F | Resp 19 | Ht 62.0 in | Wt 199.1 lb

## 2020-07-24 DIAGNOSIS — I509 Heart failure, unspecified: Secondary | ICD-10-CM | POA: Insufficient documentation

## 2020-07-24 DIAGNOSIS — E039 Hypothyroidism, unspecified: Secondary | ICD-10-CM | POA: Diagnosis not present

## 2020-07-24 DIAGNOSIS — Z5111 Encounter for antineoplastic chemotherapy: Secondary | ICD-10-CM

## 2020-07-24 DIAGNOSIS — C8313 Mantle cell lymphoma, intra-abdominal lymph nodes: Secondary | ICD-10-CM

## 2020-07-24 DIAGNOSIS — R21 Rash and other nonspecific skin eruption: Secondary | ICD-10-CM | POA: Insufficient documentation

## 2020-07-24 DIAGNOSIS — E669 Obesity, unspecified: Secondary | ICD-10-CM | POA: Insufficient documentation

## 2020-07-24 DIAGNOSIS — C8318 Mantle cell lymphoma, lymph nodes of multiple sites: Secondary | ICD-10-CM | POA: Insufficient documentation

## 2020-07-24 LAB — CBC WITH DIFFERENTIAL (CANCER CENTER ONLY)
Abs Immature Granulocytes: 0.04 10*3/uL (ref 0.00–0.07)
Basophils Absolute: 0 10*3/uL (ref 0.0–0.1)
Basophils Relative: 1 %
Eosinophils Absolute: 0 10*3/uL (ref 0.0–0.5)
Eosinophils Relative: 1 %
HCT: 43.5 % (ref 36.0–46.0)
Hemoglobin: 14 g/dL (ref 12.0–15.0)
Immature Granulocytes: 1 %
Lymphocytes Relative: 16 %
Lymphs Abs: 1 10*3/uL (ref 0.7–4.0)
MCH: 27.9 pg (ref 26.0–34.0)
MCHC: 32.2 g/dL (ref 30.0–36.0)
MCV: 86.8 fL (ref 80.0–100.0)
Monocytes Absolute: 0.7 10*3/uL (ref 0.1–1.0)
Monocytes Relative: 11 %
Neutro Abs: 4.1 10*3/uL (ref 1.7–7.7)
Neutrophils Relative %: 70 %
Platelet Count: 191 10*3/uL (ref 150–400)
RBC: 5.01 MIL/uL (ref 3.87–5.11)
RDW: 14.9 % (ref 11.5–15.5)
WBC Count: 5.8 10*3/uL (ref 4.0–10.5)
nRBC: 0 % (ref 0.0–0.2)

## 2020-07-24 LAB — CMP (CANCER CENTER ONLY)
ALT: 20 U/L (ref 0–44)
AST: 17 U/L (ref 15–41)
Albumin: 3.6 g/dL (ref 3.5–5.0)
Alkaline Phosphatase: 92 U/L (ref 38–126)
Anion gap: 8 (ref 5–15)
BUN: 17 mg/dL (ref 8–23)
CO2: 27 mmol/L (ref 22–32)
Calcium: 9.6 mg/dL (ref 8.9–10.3)
Chloride: 106 mmol/L (ref 98–111)
Creatinine: 1.14 mg/dL — ABNORMAL HIGH (ref 0.44–1.00)
GFR, Estimated: 54 mL/min — ABNORMAL LOW (ref >60)
Glucose, Bld: 101 mg/dL — ABNORMAL HIGH (ref 70–99)
Potassium: 4.4 mmol/L (ref 3.5–5.1)
Sodium: 141 mmol/L (ref 135–145)
Total Bilirubin: 0.4 mg/dL (ref 0.3–1.2)
Total Protein: 6.9 g/dL (ref 6.5–8.1)

## 2020-07-24 LAB — LACTATE DEHYDROGENASE: LDH: 137 U/L (ref 98–192)

## 2020-07-24 LAB — URIC ACID: Uric Acid, Serum: 4.6 mg/dL (ref 2.5–7.1)

## 2020-07-24 NOTE — Progress Notes (Signed)
[place] [phone]   Fax:([phone]  OFFICE PROGRESS NOTE  [name], MD [address] [place] [address]  DIAGNOSIS: Stage IV non-Hodgkin's lymphoma, mantle cell lymphoma.  She presented with left iliac and retroperitoneal lymphadenopathy.  She also presented with left axillary lymphadenopathy and bone marrow involvement.  She was diagnosed in December 2020.   PRIOR THERAPY: Systemic chemotherapy with R-CHOP with Neulasta support.  First dose on 01/27/2019. Status post 6 cycles.    CURRENT THERAPY: Acalabrutinib 100 mg p.o. twice daily.  First dose started June 17, 2020.  INTERVAL HISTORY: Natalie Baker 65 y.o. female returns to the clinic today for follow-up visit accompanied by her daughter.  The patient is feeling fine today with no concerning complaints except for mild skin rash on the shoulder and chest area.  The rash is not itchy and she is tolerating it well.  She has been tolerating her treatment with acalabrutinib fairly well.  The patient denied having any current chest pain, shortness of breath, cough or hemoptysis.  She denied having any fever or chills.  She has no nausea, vomiting, diarrhea or constipation.  She has no headache or visual changes.  She has no recent weight loss or night sweats.  She was seen by the stem cell transplant team at [facility] and they are waiting for the response on the acalabrutinib before initiating the transplant process.  The patient is here today for evaluation and repeat blood work.  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  MEDICATIONS:  Current Outpatient Medications  Medication Sig Dispense Refill   acalabrutinib (CALQUENCE) 100 MG capsule Take 1 capsule (100 mg total) by mouth 2 (two) times daily. 60 capsule 3   ARMOUR THYROID 90 MG tablet Take 1 tablet (90 mg total) by mouth every morning. 90 tablet 1   carvedilol (COREG) 6.25 MG tablet Take 3.125 mg by mouth 2 (two) times daily with a meal.     Cholecalciferol 125 MCG (5000 UT) capsule Take 2 capsules (10,000 Units total) by mouth daily. 60 capsule 0   dapagliflozin propanediol (FARXIGA) 10 MG TABS tablet Take 1 tablet (10 mg total) by mouth daily before breakfast. 30 tablet 11   estradiol (VIVELLE-DOT) 0.075 MG/24HR Place 1 patch onto the skin 2 (two) times a week.      losartan (COZAAR) 25 MG tablet Take 12.5 mg by mouth 2 (two) times daily.     Potassium Chloride ER 20 MEQ TBCR Take 1 tablet by mouth as needed. Take with Torsemide 90 tablet 3   spironolactone (ALDACTONE) 25 MG tablet TAKE 1 TABLET BY MOUTH EVERY DAY 90 tablet 3   torsemide (DEMADEX) 20 MG tablet Take 20 mg by mouth daily as needed.     No current facility-administered medications for this visit.    SURGICAL HISTORY:  Past Surgical History:  Procedure Laterality Date   ABDOMINAL HYSTERECTOMY  [place]  COLONOSCOPY W/ BIOPSIES  12/29/2006   IR IMAGING GUIDED PORT INSERTION  01/15/2019   LAPAROSCOPY     1991 for emdometriosis   RIGHT/LEFT HEART CATH AND CORONARY ANGIOGRAPHY N/A 06/25/2019   Procedure: RIGHT/LEFT HEART CATH AND CORONARY ANGIOGRAPHY;  Surgeon: Jolaine Artist, MD;  Location: [facility];  Service: Cardiovascular;  Laterality: N/A;   UPPER GASTROINTESTINAL ENDOSCOPY      REVIEW OF SYSTEMS:  A comprehensive review of systems was negative except for: Integument/breast: positive for rash   PHYSICAL EXAMINATION: General appearance: alert, cooperative, and no distress Head: Normocephalic, without  obvious abnormality, atraumatic Neck: no adenopathy, no JVD, supple, symmetrical, trachea midline, and thyroid not enlarged, symmetric, no tenderness/mass/nodules Lymph nodes: Cervical, supraclavicular, and axillary nodes normal. Resp: clear to auscultation bilaterally Back: symmetric, no curvature. ROM normal. No CVA tenderness. Cardio: regular rate and rhythm, S1, S2 normal, no murmur, click, rub or gallop GI: soft, non-tender; bowel sounds normal; no masses,  no organomegaly Extremities: extremities normal, atraumatic, no cyanosis or edema  ECOG PERFORMANCE STATUS: 1 - Symptomatic but completely ambulatory  Blood pressure 108/78, pulse 90, temperature (!) 97.4 F (36.3 C), temperature source Tympanic, resp. rate 19, height 5\' 2"  (1.575 m), weight 199 lb 1.6 oz (90.3 kg), SpO2 98 %.  LABORATORY DATA: Lab Results  Component Value Date   WBC 5.8 07/24/2020   HGB 14.0 07/24/2020   HCT 43.5 07/24/2020   MCV 86.8 07/24/2020   PLT 191 07/24/2020      Chemistry      Component Value Date/Time   NA 141 07/05/2020 1059   K 4.3 07/05/2020 1059   CL 107 07/05/2020 1059   CO2 24 07/05/2020 1059   BUN 18 07/05/2020 1059   CREATININE 0.97 07/05/2020 1059   CREATININE 0.74 12/21/2018 1513      Component Value Date/Time   CALCIUM 9.3 07/05/2020 1059   ALKPHOS 82 07/05/2020 1059   AST 16 07/05/2020 1059   ALT 17 07/05/2020 1059   BILITOT 0.3 07/05/2020 1059       RADIOGRAPHIC STUDIES: No results found.   ASSESSMENT AND PLAN: This is a very pleasant 65 years old white female recently diagnosed with stage IV less aggressive mantle cell lymphoma presented with left iliac and retroperitoneal lymphadenopathy as well as left axillary and bone marrow involvement in December 2020. The patient completed 6 cycles of systemic chemotherapy with R-CHOP.  She tolerated her treatment fairly well with no concerning adverse effects but few months later she developed congestive heart failure with  drop in her ejection fraction to the 20-25%.  This has improved over the last few months and currently around 55%.  She is followed by Dr. Haroldine Laws. Unfortunately repeat CT scan in June 2022 showed evidence for disease progression with mild increase in the small bilateral axillary lymph nodes and moderate increase in the left supraclavicular as well as ileocolic mesenteric lymph nodes and left external iliac lymph nodes but mild increase in the periaortic retroperitoneal lymph nodes. The patient is started treatment with acalabrutinib 100 mg p.o. twice daily on June 17, 2020 and has been tolerating her treatment well except for mild non pruritic skin rash on the shoulder and chest. She was seen by Dr. Laverle Hobby at [facility] on July 12, 2020 for discussion of potential autologous stem cell transplant.  They are waiting for the response on the acalabrutinib before initiating the transplant process. I recommended for the patient to continue her current treatment with acalabrutinib  for now. I will see her back for follow-up visit in 1 months for evaluation and repeat blood work. I will consider repeating CT scan of the chest, abdomen pelvis in September 2022. For the skin rash, I offered the patient treatment with Medrol Dosepak or other steroids but she declined.  She will apply hydrocortisone cream to the affected area as needed. She was advised to call immediately if she has any concerning symptoms in the interval.  The patient voices understanding of current disease status and treatment options and is in agreement with the current care plan.  All questions were answered. The patient knows to call the clinic with any problems, questions or concerns. We can certainly see the patient much sooner if necessary.  Disclaimer: This note was dictated with voice recognition software. Similar sounding words can inadvertently be transcribed and may not be corrected upon review.

## 2020-08-07 ENCOUNTER — Other Ambulatory Visit (HOSPITAL_COMMUNITY): Payer: Self-pay

## 2020-08-10 ENCOUNTER — Other Ambulatory Visit (HOSPITAL_COMMUNITY): Payer: Self-pay

## 2020-08-17 ENCOUNTER — Other Ambulatory Visit: Payer: Self-pay | Admitting: Adult Health

## 2020-08-17 ENCOUNTER — Telehealth: Payer: Self-pay | Admitting: Adult Health

## 2020-08-17 DIAGNOSIS — K8020 Calculus of gallbladder without cholecystitis without obstruction: Secondary | ICD-10-CM | POA: Insufficient documentation

## 2020-08-17 HISTORY — DX: Calculus of gallbladder without cholecystitis without obstruction: K80.20

## 2020-08-17 NOTE — Telephone Encounter (Signed)
patient called to request referral to general surgery for gall bladder removal. States she has received clearance from her cardiologist as part of the required clearance for this procedure. Please review 06/29/20 phone encounters and advise your recommendation to patient. She states a pending stem cell transplant is being considered after gall bladder removal complete.

## 2020-08-21 ENCOUNTER — Other Ambulatory Visit (HOSPITAL_COMMUNITY): Payer: Self-pay | Admitting: Cardiology

## 2020-08-21 ENCOUNTER — Encounter: Payer: BC Managed Care – PPO | Admitting: Adult Health

## 2020-08-29 ENCOUNTER — Inpatient Hospital Stay: Payer: BC Managed Care – PPO

## 2020-08-29 ENCOUNTER — Inpatient Hospital Stay: Payer: BC Managed Care – PPO | Attending: Physician Assistant | Admitting: Internal Medicine

## 2020-08-29 ENCOUNTER — Other Ambulatory Visit: Payer: Self-pay

## 2020-08-29 VITALS — BP 111/56 | HR 93 | Temp 97.6°F | Resp 18 | Ht 62.0 in | Wt 198.9 lb

## 2020-08-29 DIAGNOSIS — Z452 Encounter for adjustment and management of vascular access device: Secondary | ICD-10-CM | POA: Diagnosis not present

## 2020-08-29 DIAGNOSIS — C8313 Mantle cell lymphoma, intra-abdominal lymph nodes: Secondary | ICD-10-CM | POA: Diagnosis not present

## 2020-08-29 DIAGNOSIS — C8318 Mantle cell lymphoma, lymph nodes of multiple sites: Secondary | ICD-10-CM | POA: Diagnosis not present

## 2020-08-29 LAB — CMP (CANCER CENTER ONLY)
ALT: 18 U/L (ref 0–44)
AST: 19 U/L (ref 15–41)
Albumin: 3.8 g/dL (ref 3.5–5.0)
Alkaline Phosphatase: 98 U/L (ref 38–126)
Anion gap: 9 (ref 5–15)
BUN: 16 mg/dL (ref 8–23)
CO2: 25 mmol/L (ref 22–32)
Calcium: 9.6 mg/dL (ref 8.9–10.3)
Chloride: 105 mmol/L (ref 98–111)
Creatinine: 1.28 mg/dL — ABNORMAL HIGH (ref 0.44–1.00)
GFR, Estimated: 47 mL/min — ABNORMAL LOW (ref >60)
Glucose, Bld: 99 mg/dL (ref 70–99)
Potassium: 4.5 mmol/L (ref 3.5–5.1)
Sodium: 139 mmol/L (ref 135–145)
Total Bilirubin: 0.4 mg/dL (ref 0.3–1.2)
Total Protein: 6.9 g/dL (ref 6.5–8.1)

## 2020-08-29 LAB — CBC WITH DIFFERENTIAL (CANCER CENTER ONLY)
Abs Immature Granulocytes: 0.04 10*3/uL (ref 0.00–0.07)
Basophils Absolute: 0 10*3/uL (ref 0.0–0.1)
Basophils Relative: 1 %
Eosinophils Absolute: 0 10*3/uL (ref 0.0–0.5)
Eosinophils Relative: 1 %
HCT: 42.6 % (ref 36.0–46.0)
Hemoglobin: 13.8 g/dL (ref 12.0–15.0)
Immature Granulocytes: 1 %
Lymphocytes Relative: 15 %
Lymphs Abs: 0.9 10*3/uL (ref 0.7–4.0)
MCH: 27.7 pg (ref 26.0–34.0)
MCHC: 32.4 g/dL (ref 30.0–36.0)
MCV: 85.4 fL (ref 80.0–100.0)
Monocytes Absolute: 0.7 10*3/uL (ref 0.1–1.0)
Monocytes Relative: 11 %
Neutro Abs: 4.5 10*3/uL (ref 1.7–7.7)
Neutrophils Relative %: 71 %
Platelet Count: 174 10*3/uL (ref 150–400)
RBC: 4.99 MIL/uL (ref 3.87–5.11)
RDW: 14.8 % (ref 11.5–15.5)
WBC Count: 6.2 10*3/uL (ref 4.0–10.5)
nRBC: 0 % (ref 0.0–0.2)

## 2020-08-29 LAB — URIC ACID: Uric Acid, Serum: 4.4 mg/dL (ref 2.5–7.1)

## 2020-08-29 LAB — LACTATE DEHYDROGENASE: LDH: 143 U/L (ref 98–192)

## 2020-08-29 NOTE — Progress Notes (Signed)
[facility] [phone]   [phone]  OFFICE PROGRESS NOTE  [name], MD [address] [place] [address]  DIAGNOSIS: Stage IV non-Hodgkin's lymphoma, mantle cell lymphoma.  She presented with left iliac and retroperitoneal lymphadenopathy.  She also presented with left axillary lymphadenopathy and bone marrow involvement.  She was diagnosed in December 2020.   PRIOR THERAPY: Systemic chemotherapy with R-CHOP with Neulasta support.  First dose on 01/27/2019. Status post 6 cycles.    CURRENT THERAPY: Acalabrutinib 100 mg p.o. twice daily.  First dose started June 17, 2020.  Status post 2 months of treatment.  INTERVAL HISTORY: Natalie Baker 65 y.o. female returns to the clinic today for follow-up visit.  The patient is feeling fine today with no concerning complaints.  She denied having any current chest pain, shortness of breath, cough or hemoptysis.  She denied having any fever or chills.  She has no nausea, vomiting, diarrhea or constipation.  She has no headache or visual changes.  She has no recent weight loss or night sweats.  She continues to tolerate her treatment with acalabrutinib fairly well.  She is here today for evaluation and repeat blood work.  MEDICAL HISTORY: Past Medical History:  Diagnosis Date   Allergy    Cancer ([place])    being worked up for lymphoma   CHF (congestive heart failure) ([place])    Colon polyp 12/29/2006   Frozen shoulder 11/16/2015   Hyperlipidemia    Hypothyroidism    Low kidney function    Obesity    BMI 34   Osteoporosis    Palpitations    Vitamin D deficiency     ALLERGIES:  is allergic to ppd [tuberculin purified protein derivative].  MEDICATIONS:  Current Outpatient Medications  Medication Sig Dispense Refill   acalabrutinib (CALQUENCE) 100 MG capsule Take 1 capsule (100 mg total) by mouth 2 (two) times daily. 60 capsule 3   ARMOUR THYROID 90 MG tablet Take 1 tablet (90  mg total) by mouth every morning. 90 tablet 1   carvedilol (COREG) 6.25 MG tablet Take 3.125 mg by mouth 2 (two) times daily with a meal.     Cholecalciferol 125 MCG (5000 UT) capsule Take 2 capsules (10,000 Units total) by mouth daily. 60 capsule 0   dapagliflozin propanediol (FARXIGA) 10 MG TABS tablet Take 1 tablet (10 mg total) by mouth daily before breakfast. 30 tablet 11   estradiol (VIVELLE-DOT) 0.075 MG/24HR Place 1 patch onto the skin 2 (two) times a week.      losartan (COZAAR) 25 MG tablet Take 12.5 mg by mouth 2 (two) times daily.     Potassium Chloride ER 20 MEQ TBCR Take 1 tablet by mouth as needed. Take with Torsemide 90 tablet 3   spironolactone (ALDACTONE) 25 MG tablet TAKE 1 TABLET BY MOUTH EVERY DAY 90 tablet 3   torsemide (DEMADEX) 20 MG tablet Take 20 mg by mouth daily as needed.     No current facility-administered medications for this visit.    SURGICAL HISTORY:  Past Surgical History:  Procedure Laterality Date   ABDOMINAL HYSTERECTOMY  1997   CESAREAN SECTION  1983   COLONOSCOPY W/ BIOPSIES  12/29/2006   IR IMAGING GUIDED PORT INSERTION  01/15/2019   LAPAROSCOPY     1991 for emdometriosis   RIGHT/LEFT HEART CATH AND CORONARY ANGIOGRAPHY N/A 06/25/2019   Procedure: RIGHT/LEFT HEART CATH AND CORONARY ANGIOGRAPHY;  Surgeon: Jolaine Artist, MD;  Location: [facility];  Service: Cardiovascular;  Laterality: N/A;   UPPER GASTROINTESTINAL ENDOSCOPY      REVIEW OF SYSTEMS:  A comprehensive review of systems was negative except for: Integument/breast: positive for rash   PHYSICAL EXAMINATION: General appearance: alert, cooperative, and no distress Head: Normocephalic, without obvious abnormality, atraumatic Neck: no adenopathy, no JVD, supple, symmetrical, trachea midline, and thyroid not enlarged, symmetric, no tenderness/mass/nodules Lymph nodes: Cervical, supraclavicular, and axillary nodes normal. Resp: clear to auscultation bilaterally Back: symmetric,  no curvature. ROM normal. No CVA tenderness. Cardio: regular rate and rhythm, S1, S2 normal, no murmur, click, rub or gallop GI: soft, non-tender; bowel sounds normal; no masses,  no organomegaly Extremities: extremities normal, atraumatic, no cyanosis or edema  ECOG PERFORMANCE STATUS: 1 - Symptomatic but completely ambulatory  Blood pressure (!) 111/56, pulse 93, temperature 97.6 F (36.4 C), temperature source Tympanic, resp. rate 18, height '5\' 2"'$  (1.575 m), weight 198 lb 14.4 oz (90.2 kg), SpO2 97 %.  LABORATORY DATA: Lab Results  Component Value Date   WBC 6.2 08/29/2020   HGB 13.8 08/29/2020   HCT 42.6 08/29/2020   MCV 85.4 08/29/2020   PLT 174 08/29/2020      Chemistry      Component Value Date/Time   NA 139 08/29/2020 1017   K 4.5 08/29/2020 1017   CL 105 08/29/2020 1017   CO2 25 08/29/2020 1017   BUN 16 08/29/2020 1017   CREATININE 1.28 (H) 08/29/2020 1017   CREATININE 0.74 12/21/2018 1513      Component Value Date/Time   CALCIUM 9.6 08/29/2020 1017   ALKPHOS 98 08/29/2020 1017   AST 19 08/29/2020 1017   ALT 18 08/29/2020 1017   BILITOT 0.4 08/29/2020 1017       RADIOGRAPHIC STUDIES: No results found.   ASSESSMENT AND PLAN: This is a very pleasant 65 years old white female recently diagnosed with stage IV less aggressive mantle cell lymphoma presented with left iliac and retroperitoneal lymphadenopathy as well as left axillary and bone marrow involvement in December 2020. The patient completed 6 cycles of systemic chemotherapy with R-CHOP.  She tolerated her treatment fairly well with no concerning adverse effects but few months later she developed congestive heart failure with drop in her ejection fraction to the 20-25%.  This has improved over the last few months and currently around 55%.  She is followed by Dr. Haroldine Laws. Unfortunately repeat CT scan in June 2022 showed evidence for disease progression with mild increase in the small bilateral axillary lymph  nodes and moderate increase in the left supraclavicular as well as ileocolic mesenteric lymph nodes and left external iliac lymph nodes but mild increase in the periaortic retroperitoneal lymph nodes. The patient is started treatment with acalabrutinib 100 mg p.o. twice daily on June 17, 2020  She was seen by Dr. Laverle Hobby at [facility] on July 12, 2020 for discussion of potential autologous stem cell transplant.   The patient continues to tolerate this treatment well except for the mild skin rash. I recommended for her to continue her treatment with acalabrutinib 100 mg p.o. twice daily as planned. I will see her back for follow-up visit in 1 months for evaluation with repeat CT scan of the chest, abdomen and pelvis for restaging of her disease. The patient was advised to call immediately if she has any other concerning symptoms in the interval. The patient voices understanding of current disease status and treatment options and is in agreement with  the current care plan.  All questions were answered. The patient knows to call the clinic with any problems, questions or concerns. We can certainly see the patient much sooner if necessary.  Disclaimer: This note was dictated with voice recognition software. Similar sounding words can inadvertently be transcribed and may not be corrected upon review.

## 2020-08-31 ENCOUNTER — Other Ambulatory Visit (HOSPITAL_COMMUNITY): Payer: Self-pay | Admitting: Cardiology

## 2020-08-31 NOTE — Telephone Encounter (Signed)
Rx(s) sent to pharmacy electronically.  

## 2020-09-01 ENCOUNTER — Other Ambulatory Visit: Payer: Self-pay

## 2020-09-01 ENCOUNTER — Inpatient Hospital Stay: Payer: BC Managed Care – PPO

## 2020-09-01 DIAGNOSIS — C8318 Mantle cell lymphoma, lymph nodes of multiple sites: Secondary | ICD-10-CM | POA: Diagnosis not present

## 2020-09-01 DIAGNOSIS — Z95828 Presence of other vascular implants and grafts: Secondary | ICD-10-CM

## 2020-09-01 DIAGNOSIS — Z452 Encounter for adjustment and management of vascular access device: Secondary | ICD-10-CM | POA: Diagnosis not present

## 2020-09-01 MED ORDER — SODIUM CHLORIDE 0.9% FLUSH
10.0000 mL | INTRAVENOUS | Status: DC | PRN
  Administered 2020-09-01: 10 mL

## 2020-09-01 MED ORDER — HEPARIN SOD (PORK) LOCK FLUSH 100 UNIT/ML IV SOLN
500.0000 [IU] | Freq: Once | INTRAVENOUS | Status: AC | PRN
  Administered 2020-09-01: 500 [IU]

## 2020-09-05 ENCOUNTER — Other Ambulatory Visit (HOSPITAL_COMMUNITY): Payer: Self-pay

## 2020-09-06 ENCOUNTER — Encounter: Payer: Self-pay | Admitting: Internal Medicine

## 2020-09-06 DIAGNOSIS — K8010 Calculus of gallbladder with chronic cholecystitis without obstruction: Secondary | ICD-10-CM | POA: Diagnosis not present

## 2020-09-08 ENCOUNTER — Telehealth: Payer: Self-pay | Admitting: Medical Oncology

## 2020-09-08 NOTE — Telephone Encounter (Signed)
Timeline for transplant if pt in remission.( CT 09/23) 09/26-stop all chemo 09/27-testing for transplant 10/05-Consent pt 10/12-Apheresis line placed 10/13-education /start GCSF injections 10/18Collect cells  10/21-admitt

## 2020-09-14 ENCOUNTER — Other Ambulatory Visit (HOSPITAL_COMMUNITY): Payer: Self-pay

## 2020-09-18 ENCOUNTER — Telehealth: Payer: Self-pay | Admitting: Medical Oncology

## 2020-09-18 ENCOUNTER — Telehealth (HOSPITAL_COMMUNITY): Payer: Self-pay | Admitting: *Deleted

## 2020-09-18 NOTE — Telephone Encounter (Signed)
Gallbladder surgery ( laparoscopic scheduled 09/25/20). Should she stop Calquence?  Per Dr Julien Nordmann, I told pt to hold the Calquence for 2 days before and 2 days after surgery.  Pt voiced understanding.

## 2020-09-18 NOTE — Telephone Encounter (Signed)
Pt left vm asking if there are any cardiac meds she needs to hold before LAPAROSCOPIC CHOLECYSTECTOMY.  Routed to [place] for advice

## 2020-09-19 NOTE — Telephone Encounter (Signed)
Spoke with pt she is aware.  

## 2020-09-19 NOTE — Progress Notes (Signed)
Please enter orders for PAT visit scheduled for 09-21-20.

## 2020-09-20 ENCOUNTER — Ambulatory Visit: Payer: Self-pay | Admitting: General Surgery

## 2020-09-20 NOTE — Progress Notes (Addendum)
DUE TO COVID-19 ONLY ONE VISITOR IS ALLOWED TO COME WITH YOU AND STAY IN THE WAITING ROOM ONLY DURING PRE OP AND PROCEDURE DAY OF SURGERY. THE 1 VISITOR  MAY VISIT WITH YOU AFTER SURGERY IN YOUR PRIVATE ROOM DURING VISITING HOURS ONLY!  YOU NEED TO HAVE A COVID 19 TEST ON_______ '@_______'$ , THIS TEST MUST BE DONE BEFORE SURGERY,  COVID TESTING SITE IS AT [employer]. PLEASE REMAIN IN YOUR CAR THIS IS A DRIVER UP TEST. AFTER YOUR COVID TEST PLEASE WEAR A MASK OUT IN PUBLIC AND SOCIAL DISTANCE AND Flushing YOUR HANDS FREQUENTLY. PLEASE ASK ALL YOUR CLOSE CONTACTS TO WEAR A MASK OUT IN PUBLIC AND SOCIAL DISTANCE AND [place] HANDS FREQUENTLY ALSO.               Natalie Baker  09/20/2020   Your procedure is scheduled on:     09/25/2020   Report to [facility] Main  Entrance   Report to admitting at115 pm     Call this number if you have problems the morning of surgery [phone]    Remember: Do not eat food , candy gum or mints :After Midnight. You may have clear liquids from midnight until __ 1230 pm  Finish G2 Lower sugary preop drink by 1230pm day of surgery.   CLEAR LIQUID DIET   Foods Allowed                                                                       Coffee and tea, regular and decaf                              Plain Jell-O any favor except red or purple                                            Fruit ices (not with fruit pulp)                                      Iced Popsicles                                     Carbonated beverages, regular and diet                                    Cranberry, grape and apple juices Sports drinks like Gatorade Lightly seasoned clear broth or consume(fat free) Sugar   _____________________________________________________________________    BRUSH YOUR TEETH MORNING OF SURGERY AND RINSE YOUR MOUTH OUT, NO CHEWING GUM CANDY OR MINTS.     Take these medicines the morning of surgery with A SIP OF WATER:   armour thyroid, coreg   DO NOT TAKE ANY DIABETIC MEDICATIONS DAY OF YOUR SURGERY  You may not have any metal on your body including hair pins and              piercings  Do not wear jewelry, make-up, lotions, powders or perfumes, deodorant             Do not wear nail polish on your fingernails.  Do not shave  48 hours prior to surgery.              Men may shave face and neck.   Do not bring valuables to the hospital. [place].  Contacts, dentures or bridgework may not be worn into surgery.  Leave suitcase in the car. After surgery it may be brought to your room.     Patients discharged the day of surgery will not be allowed to drive home. IF YOU ARE HAVING SURGERY AND GOING HOME THE SAME DAY, YOU MUST HAVE AN ADULT TO DRIVE YOU HOME AND BE WITH YOU FOR 24 HOURS. YOU MAY GO HOME BY TAXI OR UBER OR ORTHERWISE, BUT AN ADULT MUST ACCOMPANY YOU HOME AND STAY WITH YOU FOR 24 HOURS.  Name and phone number of your driver:  Special Instructions: N/A              Please read over the following fact sheets you were given: _____________________________________________________________________  [facility] - Preparing for Surgery Before surgery, you can play an important role.  Because skin is not sterile, your skin needs to be as free of germs as possible.  You can reduce the number of germs on your skin by washing with CHG (chlorahexidine gluconate) soap before surgery.  CHG is an antiseptic cleaner which kills germs and bonds with the skin to continue killing germs even after washing. Please DO NOT use if you have an allergy to CHG or antibacterial soaps.  If your skin becomes reddened/irritated stop using the CHG and inform your nurse when you arrive at Short Stay. Do not shave (including legs and underarms) for at least 48 hours prior to the first CHG shower.  You may shave your face/neck. Please follow these  instructions carefully:  1.  Shower with CHG Soap the night before surgery and the  morning of Surgery.  2.  If you choose to wash your hair, wash your hair first as usual with your  normal  shampoo.  3.  After you shampoo, rinse your hair and body thoroughly to remove the  shampoo.                           4.  Use CHG as you would any other liquid soap.  You can apply chg directly  to the skin and wash                       Gently with a scrungie or clean washcloth.  5.  Apply the CHG Soap to your body ONLY FROM THE NECK DOWN.   Do not use on face/ open                           Wound or open sores. Avoid contact with eyes, ears mouth and genitals (private parts).  Wash face,  Genitals (private parts) with your normal soap.             6.  Wash thoroughly, paying special attention to the area where your surgery  will be performed.  7.  Thoroughly rinse your body with warm water from the neck down.  8.  DO NOT shower/wash with your normal soap after using and rinsing off  the CHG Soap.                9.  Pat yourself dry with a clean towel.            10.  Wear clean pajamas.            11.  Place clean sheets on your bed the night of your first shower and do not  sleep with pets. Day of Surgery : Do not apply any lotions/deodorants the morning of surgery.  Please wear clean clothes to the hospital/surgery center.  FAILURE TO FOLLOW THESE INSTRUCTIONS MAY RESULT IN THE CANCELLATION OF YOUR SURGERY PATIENT SIGNATURE_________________________________  NURSE SIGNATURE__________________________________  ________________________________________________________________________

## 2020-09-21 ENCOUNTER — Encounter (HOSPITAL_COMMUNITY): Payer: Self-pay

## 2020-09-21 ENCOUNTER — Ambulatory Visit: Payer: Self-pay | Admitting: General Surgery

## 2020-09-21 ENCOUNTER — Other Ambulatory Visit: Payer: Self-pay

## 2020-09-21 ENCOUNTER — Telehealth (HOSPITAL_COMMUNITY): Payer: Self-pay | Admitting: *Deleted

## 2020-09-21 ENCOUNTER — Encounter (HOSPITAL_COMMUNITY)
Admission: RE | Admit: 2020-09-21 | Discharge: 2020-09-21 | Disposition: A | Discharge status: Home / Self Care | Payer: BC Managed Care – PPO | Source: Ambulatory Visit | Attending: General Surgery | Admitting: General Surgery

## 2020-09-21 DIAGNOSIS — Z01812 Encounter for preprocedural laboratory examination: Secondary | ICD-10-CM | POA: Diagnosis not present

## 2020-09-21 DIAGNOSIS — Z87891 Personal history of nicotine dependence: Secondary | ICD-10-CM | POA: Insufficient documentation

## 2020-09-21 DIAGNOSIS — K811 Chronic cholecystitis: Secondary | ICD-10-CM | POA: Diagnosis not present

## 2020-09-21 DIAGNOSIS — Z79899 Other long term (current) drug therapy: Secondary | ICD-10-CM | POA: Insufficient documentation

## 2020-09-21 DIAGNOSIS — Z8572 Personal history of non-Hodgkin lymphomas: Secondary | ICD-10-CM | POA: Insufficient documentation

## 2020-09-21 DIAGNOSIS — I509 Heart failure, unspecified: Secondary | ICD-10-CM | POA: Diagnosis not present

## 2020-09-21 HISTORY — DX: Prediabetes: R73.03

## 2020-09-21 HISTORY — DX: Cardiac arrhythmia, unspecified: I49.9

## 2020-09-21 LAB — CBC WITH DIFFERENTIAL/PLATELET
Abs Immature Granulocytes: 0.05 10*3/uL (ref 0.00–0.07)
Basophils Absolute: 0 10*3/uL (ref 0.0–0.1)
Basophils Relative: 1 %
Eosinophils Absolute: 0 10*3/uL (ref 0.0–0.5)
Eosinophils Relative: 1 %
HCT: 45.6 % (ref 36.0–46.0)
Hemoglobin: 14 g/dL (ref 12.0–15.0)
Immature Granulocytes: 1 %
Lymphocytes Relative: 16 %
Lymphs Abs: 1 10*3/uL (ref 0.7–4.0)
MCH: 27.2 pg (ref 26.0–34.0)
MCHC: 30.7 g/dL (ref 30.0–36.0)
MCV: 88.7 fL (ref 80.0–100.0)
Monocytes Absolute: 0.6 10*3/uL (ref 0.1–1.0)
Monocytes Relative: 9 %
Neutro Abs: 4.6 10*3/uL (ref 1.7–7.7)
Neutrophils Relative %: 72 %
Platelets: 189 10*3/uL (ref 150–400)
RBC: 5.14 MIL/uL — ABNORMAL HIGH (ref 3.87–5.11)
RDW: 14.7 % (ref 11.5–15.5)
WBC: 6.2 10*3/uL (ref 4.0–10.5)
nRBC: 0 % (ref 0.0–0.2)

## 2020-09-21 LAB — COMPREHENSIVE METABOLIC PANEL
ALT: 20 U/L (ref 0–44)
AST: 19 U/L (ref 15–41)
Albumin: 4 g/dL (ref 3.5–5.0)
Alkaline Phosphatase: 96 U/L (ref 38–126)
Anion gap: 8 (ref 5–15)
BUN: 16 mg/dL (ref 8–23)
CO2: 27 mmol/L (ref 22–32)
Calcium: 9.3 mg/dL (ref 8.9–10.3)
Chloride: 102 mmol/L (ref 98–111)
Creatinine, Ser: 0.94 mg/dL (ref 0.44–1.00)
GFR, Estimated: >60 mL/min (ref >60)
Glucose, Bld: 93 mg/dL (ref 70–99)
Potassium: 4.6 mmol/L (ref 3.5–5.1)
Sodium: 137 mmol/L (ref 135–145)
Total Bilirubin: 0.5 mg/dL (ref 0.3–1.2)
Total Protein: 7 g/dL (ref 6.5–8.1)

## 2020-09-21 LAB — HEMOGLOBIN A1C
Hgb A1c MFr Bld: 5.3 % (ref 4.8–5.6)
Mean Plasma Glucose: 105.41 mg/dL

## 2020-09-21 LAB — GLUCOSE, CAPILLARY: Glucose-Capillary: 101 mg/dL — ABNORMAL HIGH (ref 70–99)

## 2020-09-21 NOTE — Anesthesia Preprocedure Evaluation (Addendum)
Anesthesia Evaluation  Patient identified by MRN, date of birth, ID band Patient awake    Reviewed: Allergy & Precautions, NPO status , Patient's Chart, lab work & pertinent test results, reviewed documented beta blocker date and time   Airway Mallampati: III  TM Distance: >3 FB Neck ROM: Full    Dental no notable dental hx. (+) Teeth Intact, Dental Advisory Given   Pulmonary former smoker,  Snores at night, never had sleep study, no witnessed apneas   Pulmonary exam normal breath sounds clear to auscultation       Cardiovascular hypertension (112/56 in preop), Pt. on medications and Pt. on home beta blockers +CHF  Normal cardiovascular exam+ dysrhythmias  Rhythm:Regular Rate:Normal  Echo 05/04/2020 1. Left ventricular ejection fraction, by estimation, is 50 to 55%. The  left ventricle has low normal function. The left ventricle has no regional  wall motion abnormalities. Left ventricular diastolic parameters are  consistent with Grade II diastolic  dysfunction (pseudonormalization).  2. Right ventricular systolic function is normal. The right ventricular  size is normal.  3. Left atrial size was mildly dilated.  4. The mitral valve is normal in structure. Trivial mitral valve  regurgitation. No evidence of mitral stenosis.  5. The aortic valve is normal in structure. Aortic valve regurgitation is  not visualized. No aortic stenosis is present.  6. The inferior vena cava is normal in size with greater than 50%  respiratory variability, suggesting right atrial pressure of 3 mmHg.    Neuro/Psych negative neurological ROS  negative psych ROS   GI/Hepatic Neg liver ROS, Chronic cholecystitis    Endo/Other  Hypothyroidism Obesity BMI 37  Renal/GU Lab Results      Component                Value               Date                      CREATININE               0.94                09/21/2020                BUN                       16                  09/21/2020                NA                       137                 09/21/2020                K                        4.6                 09/21/2020                CL                       102  09/21/2020                CO2                      27                  09/21/2020             negative genitourinary   Musculoskeletal negative musculoskeletal ROS (+)   Abdominal (+) + obese (BMI 36.5),   Peds  Hematology       hct 45.6, plt 189   Anesthesia Other Findings Stage IV non Hodgkins Lymphoma-   Reproductive/Obstetrics negative OB ROS                           Anesthesia Physical Anesthesia Plan  ASA: 3  Anesthesia Plan: General   Post-op Pain Management:    Induction: Intravenous  PONV Risk Score and Plan: 4 or greater and Midazolam, Dexamethasone, Ondansetron and Treatment may vary due to age or medical condition  Airway Management Planned: Oral ETT  Additional Equipment: None  Intra-op Plan:   Post-operative Plan: Extubation in OR  Informed Consent: I have reviewed the patients History and Physical, chart, labs and discussed the procedure including the risks, benefits and alternatives for the proposed anesthesia with the patient or authorized representative who has indicated his/her understanding and acceptance.     Dental advisory given  Plan Discussed with: CRNA  Anesthesia Plan Comments:      Anesthesia Quick Evaluation

## 2020-09-21 NOTE — Telephone Encounter (Signed)
Cardiac clearance faxed to [facility] health at [phone] attn [place]on Mole, MSN,RN   "Ok to proceed from cardiac perspective" -Dr.Daniel Bensimhon

## 2020-09-21 NOTE — Progress Notes (Addendum)
Anesthesia Review:  PCP: DR [name]  Cardiologist : DR Bensimhon  See note dated 09/21/20 for clearance from Dr Haroldine Laws .  6/3/22[name], NP LOV  Chest x-ray : 06/09/20- Ct chest  EKG : 11/04/19  Echo : 05/04/20  Stress test: Cardiac Cath :  2021  Activity level: can do a flight of stairs without difficulty  Sleep Study/ CPAP : none  Fasting Blood Sugar :      / Checks Blood Sugar -- times a day:   Blood Thinner/ Instructions /Last Dose: ASA / Instructions/ Last Dose :   Calquence- PER pt per DR Earlie Server pt's last dose prior to surgery in 09/22/2020.   Farxiga per pt is not used for diabetes it is used for heart failure.  Spoke with Lyndon Code, PA regarding this at preop appt pt is not to take am of surgery and pt is aware.   PT with hx of lymphoma with chemoinduce heart failure.  PT has PORT.  Blood pressure at proep was 95/40.  PT denies any dizziness or lightheadedness or blurred vision.  PT reports blood pressure runs low.  NO covid test required- ambulatory surgery   Hgba1c-09/21/20-5.3

## 2020-09-21 NOTE — Progress Notes (Signed)
Anesthesia Chart Review   Case: [id number] Date/Time: 09/25/20 1515   Procedure: LAPAROSCOPIC CHOLECYSTECTOMY - 60 MINUTES   Anesthesia type: General   Pre-op diagnosis: CHRONIC CHOLECYSTITIS   Location: [facility] ROOM 02 / [facility]   Surgeons: Kinsinger, Arta Bruce, MD       DISCUSSION:65 y.o. former smoker with h/o CHF, mantle cell lymphoma, Stage IV non-Hodgkin's lymphoma, chronic cholecystitis scheduled for above procedure 9/19/222 with Dr. Gurney Maxin.   Pt admitted 06/2019 with acute HF, EF 20-25%.  Echo 05/04/20 with improved, EF 50-55%.   Per telephone encounter 09/21/2020, "Ok to proceed from cardiac perspective". Pt hypotensive at PAT visit, asymptomatic.  Evaluated DOS.   Pt currently on Calquence, advised by oncology to hold 2 days before and 2 days after.   Anticipate pt can proceed with planned procedure barring acute status change and after evaluation DOS>  VS: BP (!) 95/40   Pulse 91   Temp 36.9 C (Oral)   Resp 16   Ht '5\' 2"'$  (1.575 m)   Wt 90.7 kg   SpO2 97%   BMI 36.58 kg/m   PROVIDERS: [name], MD is PCP   Glori Bickers, MD is Cardiologist  LABS: Labs reviewed: Acceptable for surgery. (all labs ordered are listed, but only abnormal results are displayed)  Labs Reviewed  CBC WITH DIFFERENTIAL/PLATELET - Abnormal; Notable for the following components:      Result Value   RBC 5.14 (*)    All other components within normal limits  GLUCOSE, CAPILLARY - Abnormal; Notable for the following components:   Glucose-Capillary 101 (*)    All other components within normal limits  HEMOGLOBIN A1C  COMPREHENSIVE METABOLIC PANEL  CBC     IMAGES:   EKG: 11/04/2019 Rate 83 bpm  Normal sinus rhythm Low voltage QRS Cannot rule out Anteroseptal infarct , age undetermined Abnormal ECG since last tracing no significant change  CV: Echo 05/04/2020  1. Left ventricular ejection fraction, by estimation, is 50 to 55%. The  left ventricle has low normal  function. The left ventricle has no regional  wall motion abnormalities. Left ventricular diastolic parameters are  consistent with Grade II diastolic  dysfunction (pseudonormalization).   2. Right ventricular systolic function is normal. The right ventricular  size is normal.   3. Left atrial size was mildly dilated.   4. The mitral valve is normal in structure. Trivial mitral valve  regurgitation. No evidence of mitral stenosis.   5. The aortic valve is normal in structure. Aortic valve regurgitation is  not visualized. No aortic stenosis is present.   6. The inferior vena cava is normal in size with greater than 50%  respiratory variability, suggesting right atrial pressure of 3 mmHg.  Past Medical History:  Diagnosis Date   Allergy    Cancer ([place])    being worked up for lymphoma   CHF (congestive heart failure) ([place])    Colon polyp 12/29/2006   Dysrhythmia    Frozen shoulder 11/16/2015   Hyperlipidemia    Hypothyroidism    Obesity    BMI 34   Osteoporosis    Palpitations    Pre-diabetes    Vitamin D deficiency     Past Surgical History:  Procedure Laterality Date   [place]   COLONOSCOPY W/ BIOPSIES  12/29/2006   IR IMAGING GUIDED PORT INSERTION  01/15/2019   LAPAROSCOPY     1991 for emdometriosis   RIGHT/LEFT HEART CATH AND CORONARY  ANGIOGRAPHY N/A 06/25/2019   Procedure: RIGHT/LEFT HEART CATH AND CORONARY ANGIOGRAPHY;  Surgeon: Jolaine Artist, MD;  Location: [facility];  Service: Cardiovascular;  Laterality: N/A;   UPPER GASTROINTESTINAL ENDOSCOPY      MEDICATIONS:  acalabrutinib (CALQUENCE) 100 MG capsule   acetaminophen (TYLENOL) 500 MG tablet   ARMOUR THYROID 90 MG tablet   carvedilol (COREG) 6.25 MG tablet   cetirizine (ZYRTEC) 10 MG tablet   Cholecalciferol 125 MCG (5000 UT) capsule   estradiol (VIVELLE-DOT) 0.05 MG/24HR patch   FARXIGA 10 MG TABS tablet   losartan (COZAAR) 25 MG tablet    Potassium Chloride ER 20 MEQ TBCR   spironolactone (ALDACTONE) 25 MG tablet   torsemide (DEMADEX) 20 MG tablet   No current facility-administered medications for this encounter.    Konrad Felix Ward, PA-C [facility] Pre-Surgical Testing [phone]

## 2020-09-25 ENCOUNTER — Encounter (HOSPITAL_COMMUNITY)
Admission: RE | Disposition: A | Discharge status: Home / Self Care | Payer: Self-pay | Source: Ambulatory Visit | Attending: General Surgery

## 2020-09-25 ENCOUNTER — Ambulatory Visit (HOSPITAL_COMMUNITY)
Admission: RE | Admit: 2020-09-25 | Discharge: 2020-09-25 | Disposition: A | Discharge status: Home / Self Care | Payer: BC Managed Care – PPO | Source: Ambulatory Visit | Attending: General Surgery | Admitting: General Surgery

## 2020-09-25 ENCOUNTER — Encounter (HOSPITAL_COMMUNITY): Payer: Self-pay | Admitting: General Surgery

## 2020-09-25 ENCOUNTER — Ambulatory Visit (HOSPITAL_COMMUNITY): Payer: BC Managed Care – PPO | Admitting: Physician Assistant

## 2020-09-25 ENCOUNTER — Ambulatory Visit (HOSPITAL_COMMUNITY): Payer: BC Managed Care – PPO | Admitting: Anesthesiology

## 2020-09-25 DIAGNOSIS — Z79890 Hormone replacement therapy (postmenopausal): Secondary | ICD-10-CM | POA: Insufficient documentation

## 2020-09-25 DIAGNOSIS — Z887 Allergy status to serum and vaccine status: Secondary | ICD-10-CM | POA: Diagnosis not present

## 2020-09-25 DIAGNOSIS — Z7984 Long term (current) use of oral hypoglycemic drugs: Secondary | ICD-10-CM | POA: Diagnosis not present

## 2020-09-25 DIAGNOSIS — E039 Hypothyroidism, unspecified: Secondary | ICD-10-CM | POA: Diagnosis not present

## 2020-09-25 DIAGNOSIS — Z9221 Personal history of antineoplastic chemotherapy: Secondary | ICD-10-CM | POA: Diagnosis not present

## 2020-09-25 DIAGNOSIS — Z8249 Family history of ischemic heart disease and other diseases of the circulatory system: Secondary | ICD-10-CM | POA: Insufficient documentation

## 2020-09-25 DIAGNOSIS — Z87891 Personal history of nicotine dependence: Secondary | ICD-10-CM | POA: Diagnosis not present

## 2020-09-25 DIAGNOSIS — I110 Hypertensive heart disease with heart failure: Secondary | ICD-10-CM | POA: Diagnosis not present

## 2020-09-25 DIAGNOSIS — K812 Acute cholecystitis with chronic cholecystitis: Secondary | ICD-10-CM | POA: Diagnosis not present

## 2020-09-25 DIAGNOSIS — I5020 Unspecified systolic (congestive) heart failure: Secondary | ICD-10-CM | POA: Diagnosis not present

## 2020-09-25 DIAGNOSIS — Z8572 Personal history of non-Hodgkin lymphomas: Secondary | ICD-10-CM | POA: Diagnosis not present

## 2020-09-25 DIAGNOSIS — Z79899 Other long term (current) drug therapy: Secondary | ICD-10-CM | POA: Insufficient documentation

## 2020-09-25 DIAGNOSIS — K8020 Calculus of gallbladder without cholecystitis without obstruction: Secondary | ICD-10-CM | POA: Diagnosis not present

## 2020-09-25 DIAGNOSIS — I509 Heart failure, unspecified: Secondary | ICD-10-CM | POA: Diagnosis not present

## 2020-09-25 DIAGNOSIS — K8010 Calculus of gallbladder with chronic cholecystitis without obstruction: Secondary | ICD-10-CM | POA: Insufficient documentation

## 2020-09-25 HISTORY — PX: CHOLECYSTECTOMY: SHX55

## 2020-09-25 LAB — GLUCOSE, CAPILLARY: Glucose-Capillary: 85 mg/dL (ref 70–99)

## 2020-09-25 SURGERY — LAPAROSCOPIC CHOLECYSTECTOMY
Anesthesia: General

## 2020-09-25 MED ORDER — MIDAZOLAM HCL 5 MG/5ML IJ SOLN
INTRAMUSCULAR | Status: DC | PRN
  Administered 2020-09-25 (×2): 1 mg via INTRAVENOUS

## 2020-09-25 MED ORDER — ONDANSETRON HCL 4 MG/2ML IJ SOLN
INTRAMUSCULAR | Status: DC | PRN
  Administered 2020-09-25: 4 mg via INTRAVENOUS

## 2020-09-25 MED ORDER — ONDANSETRON HCL 4 MG/2ML IJ SOLN
INTRAMUSCULAR | Status: AC
  Filled 2020-09-25: qty 2

## 2020-09-25 MED ORDER — DIPHENHYDRAMINE HCL 50 MG/ML IJ SOLN
INTRAMUSCULAR | Status: AC
  Filled 2020-09-25: qty 1

## 2020-09-25 MED ORDER — PROPOFOL 10 MG/ML IV BOLUS
INTRAVENOUS | Status: DC | PRN
  Administered 2020-09-25: 100 mg via INTRAVENOUS

## 2020-09-25 MED ORDER — DEXAMETHASONE SODIUM PHOSPHATE 10 MG/ML IJ SOLN
INTRAMUSCULAR | Status: DC | PRN
  Administered 2020-09-25: 10 mg via INTRAVENOUS

## 2020-09-25 MED ORDER — SUCCINYLCHOLINE CHLORIDE 200 MG/10ML IV SOSY
PREFILLED_SYRINGE | INTRAVENOUS | Status: DC | PRN
  Administered 2020-09-25: 120 mg via INTRAVENOUS

## 2020-09-25 MED ORDER — SUCCINYLCHOLINE CHLORIDE 200 MG/10ML IV SOSY
PREFILLED_SYRINGE | INTRAVENOUS | Status: AC
  Filled 2020-09-25: qty 10

## 2020-09-25 MED ORDER — CHLORHEXIDINE GLUCONATE 0.12 % MT SOLN
15.0000 mL | Freq: Once | OROMUCOSAL | Status: AC
  Administered 2020-09-25: 15 mL via OROMUCOSAL

## 2020-09-25 MED ORDER — LIDOCAINE HCL (PF) 2 % IJ SOLN
INTRAMUSCULAR | Status: AC
  Filled 2020-09-25: qty 5

## 2020-09-25 MED ORDER — BUPIVACAINE-EPINEPHRINE 0.25% -1:200000 IJ SOLN
INTRAMUSCULAR | Status: DC | PRN
  Administered 2020-09-25: 20 mL

## 2020-09-25 MED ORDER — METOCLOPRAMIDE HCL 5 MG/ML IJ SOLN
INTRAMUSCULAR | Status: AC
  Filled 2020-09-25: qty 2

## 2020-09-25 MED ORDER — LACTATED RINGERS IR SOLN
Status: DC | PRN
  Administered 2020-09-25: 1000 mL

## 2020-09-25 MED ORDER — OXYCODONE HCL 5 MG PO TABS: 5.0000 mg | ORAL_TABLET | Freq: Once | ORAL | Status: DC | PRN

## 2020-09-25 MED ORDER — CHLORHEXIDINE GLUCONATE CLOTH 2 % EX PADS: 6.0000 | MEDICATED_PAD | Freq: Once | CUTANEOUS | Status: DC

## 2020-09-25 MED ORDER — PROMETHAZINE HCL 25 MG/ML IJ SOLN: 6.2500 mg | INTRAMUSCULAR | Status: DC | PRN

## 2020-09-25 MED ORDER — PHENYLEPHRINE 40 MCG/ML (10ML) SYRINGE FOR IV PUSH (FOR BLOOD PRESSURE SUPPORT)
PREFILLED_SYRINGE | INTRAVENOUS | Status: AC
  Filled 2020-09-25: qty 10

## 2020-09-25 MED ORDER — ORAL CARE MOUTH RINSE: 15.0000 mL | Freq: Once | OROMUCOSAL | Status: AC

## 2020-09-25 MED ORDER — SUGAMMADEX SODIUM 200 MG/2ML IV SOLN
INTRAVENOUS | Status: DC | PRN
  Administered 2020-09-25: 200 mg via INTRAVENOUS

## 2020-09-25 MED ORDER — IBUPROFEN 800 MG PO TABS: 800.0000 mg | ORAL_TABLET | Freq: Three times a day (TID) | ORAL | 0 refills | Status: DC | PRN

## 2020-09-25 MED ORDER — ACETAMINOPHEN 500 MG PO TABS
1000.0000 mg | ORAL_TABLET | ORAL | Status: AC
  Administered 2020-09-25: 1000 mg via ORAL
  Filled 2020-09-25: qty 2

## 2020-09-25 MED ORDER — FENTANYL CITRATE (PF) 250 MCG/5ML IJ SOLN
INTRAMUSCULAR | Status: DC | PRN
  Administered 2020-09-25 (×2): 50 ug via INTRAVENOUS

## 2020-09-25 MED ORDER — FENTANYL CITRATE (PF) 250 MCG/5ML IJ SOLN
INTRAMUSCULAR | Status: AC
  Filled 2020-09-25: qty 5

## 2020-09-25 MED ORDER — DEXAMETHASONE SODIUM PHOSPHATE 10 MG/ML IJ SOLN
INTRAMUSCULAR | Status: AC
  Filled 2020-09-25: qty 1

## 2020-09-25 MED ORDER — ROCURONIUM BROMIDE 10 MG/ML (PF) SYRINGE
PREFILLED_SYRINGE | INTRAVENOUS | Status: DC | PRN
  Administered 2020-09-25: 40 mg via INTRAVENOUS

## 2020-09-25 MED ORDER — BUPIVACAINE-EPINEPHRINE 0.25% -1:200000 IJ SOLN
INTRAMUSCULAR | Status: AC
  Filled 2020-09-25: qty 1

## 2020-09-25 MED ORDER — DIPHENHYDRAMINE HCL 50 MG/ML IJ SOLN
INTRAMUSCULAR | Status: DC | PRN
  Administered 2020-09-25: 12.5 mg via INTRAVENOUS

## 2020-09-25 MED ORDER — ROCURONIUM BROMIDE 10 MG/ML (PF) SYRINGE
PREFILLED_SYRINGE | INTRAVENOUS | Status: AC
  Filled 2020-09-25: qty 10

## 2020-09-25 MED ORDER — MIDAZOLAM HCL 2 MG/2ML IJ SOLN
INTRAMUSCULAR | Status: AC
  Filled 2020-09-25: qty 2

## 2020-09-25 MED ORDER — PHENYLEPHRINE HCL (PRESSORS) 10 MG/ML IV SOLN
INTRAVENOUS | Status: AC
  Filled 2020-09-25: qty 1

## 2020-09-25 MED ORDER — METOCLOPRAMIDE HCL 5 MG/ML IJ SOLN
INTRAMUSCULAR | Status: DC | PRN
  Administered 2020-09-25: 5 mg via INTRAVENOUS

## 2020-09-25 MED ORDER — OXYCODONE HCL 5 MG/5ML PO SOLN
5.0000 mg | Freq: Once | ORAL | Status: DC | PRN
Start: 2020-09-25 — End: 2020-09-26

## 2020-09-25 MED ORDER — LIDOCAINE 2% (20 MG/ML) 5 ML SYRINGE
INTRAMUSCULAR | Status: DC | PRN
  Administered 2020-09-25: 60 mg via INTRAVENOUS

## 2020-09-25 MED ORDER — HYDROMORPHONE HCL 1 MG/ML IJ SOLN: 0.2500 mg | INTRAMUSCULAR | Status: DC | PRN

## 2020-09-25 MED ORDER — AMISULPRIDE (ANTIEMETIC) 5 MG/2ML IV SOLN: 10.0000 mg | Freq: Once | INTRAVENOUS | Status: DC | PRN

## 2020-09-25 MED ORDER — OXYCODONE HCL 5 MG PO TABS: 5.0000 mg | ORAL_TABLET | Freq: Four times a day (QID) | ORAL | 0 refills | Status: DC | PRN

## 2020-09-25 MED ORDER — PHENYLEPHRINE HCL-NACL 20-0.9 MG/250ML-% IV SOLN
INTRAVENOUS | Status: DC | PRN
  Administered 2020-09-25: 25 ug/min via INTRAVENOUS

## 2020-09-25 MED ORDER — CEFAZOLIN SODIUM-DEXTROSE 2-4 GM/100ML-% IV SOLN
2.0000 g | INTRAVENOUS | Status: AC
Start: 2020-09-25 — End: 2020-09-25
  Administered 2020-09-25: 2 g via INTRAVENOUS
  Filled 2020-09-25: qty 100

## 2020-09-25 MED ORDER — LACTATED RINGERS IV SOLN: INTRAVENOUS | Status: DC

## 2020-09-25 SURGICAL SUPPLY — 47 items
APL PRP STRL LF DISP 70% ISPRP (MISCELLANEOUS) ×1
APL SKNCLS STERI-STRIP NONHPOA (GAUZE/BANDAGES/DRESSINGS) ×1
APPLIER CLIP 5 13 M/L LIGAMAX5 (MISCELLANEOUS)
APPLIER CLIP ROT 10 11.4 M/L (STAPLE)
APR CLP MED LRG 11.4X10 (STAPLE)
APR CLP MED LRG 5 ANG JAW (MISCELLANEOUS)
BAG COUNTER SPONGE SURGICOUNT (BAG) IMPLANT
BAG SPEC RTRVL 10 TROC 200 (ENDOMECHANICALS) ×1
BAG SPNG CNTER NS LX DISP (BAG)
BENZOIN TINCTURE PRP APPL 2/3 (GAUZE/BANDAGES/DRESSINGS) ×2 IMPLANT
BNDG ADH 1X3 SHEER STRL LF (GAUZE/BANDAGES/DRESSINGS) ×8 IMPLANT
BNDG ADH THN 3X1 STRL LF (GAUZE/BANDAGES/DRESSINGS) ×4
CABLE HIGH FREQUENCY MONO STRZ (ELECTRODE) ×2 IMPLANT
CHLORAPREP W/TINT 26 (MISCELLANEOUS) ×2 IMPLANT
CLIP APPLIE 5 13 M/L LIGAMAX5 (MISCELLANEOUS) IMPLANT
CLIP APPLIE ROT 10 11.4 M/L (STAPLE) IMPLANT
CLIP LIGATING HEMO O LOK GREEN (MISCELLANEOUS) ×2 IMPLANT
COVER MAYO STAND STRL (DRAPES) ×2 IMPLANT
COVER SURGICAL LIGHT HANDLE (MISCELLANEOUS) ×2 IMPLANT
DECANTER SPIKE VIAL GLASS SM (MISCELLANEOUS) ×1 IMPLANT
DRAIN CHANNEL 19F RND (DRAIN) IMPLANT
DRAPE C-ARM 42X120 X-RAY (DRAPES) IMPLANT
EVACUATOR SILICONE 100CC (DRAIN) IMPLANT
GLOVE SURG POLYISO LF SZ7 (GLOVE) ×7 IMPLANT
GLOVE SURG UNDER POLY LF SZ7 (GLOVE) ×2 IMPLANT
GOWN STRL REUS W/TWL LRG LVL3 (GOWN DISPOSABLE) ×3 IMPLANT
GOWN STRL REUS W/TWL XL LVL3 (GOWN DISPOSABLE) ×4 IMPLANT
GRASPER SUT TROCAR 14GX15 (MISCELLANEOUS) IMPLANT
KIT BASIN OR (CUSTOM PROCEDURE TRAY) ×2 IMPLANT
KIT TURNOVER KIT A (KITS) ×2 IMPLANT
POUCH RETRIEVAL ECOSAC 10 (ENDOMECHANICALS) ×1 IMPLANT
POUCH RETRIEVAL ECOSAC 10MM (ENDOMECHANICALS) ×2
SCISSORS LAP 5X35 DISP (ENDOMECHANICALS) ×2 IMPLANT
SET CHOLANGIOGRAPH MIX (MISCELLANEOUS) IMPLANT
SET IRRIG TUBING LAPAROSCOPIC (IRRIGATION / IRRIGATOR) ×2 IMPLANT
SET TUBE SMOKE EVAC HIGH FLOW (TUBING) ×2 IMPLANT
SLEEVE XCEL OPT CAN 5 100 (ENDOMECHANICALS) ×4 IMPLANT
STOPCOCK 4 WAY LG BORE MALE ST (IV SETS) IMPLANT
STRIP CLOSURE SKIN 1/2X4 (GAUZE/BANDAGES/DRESSINGS) ×2 IMPLANT
SUT ETHILON 2 0 PS N (SUTURE) IMPLANT
SUT MNCRL AB 4-0 PS2 18 (SUTURE) ×2 IMPLANT
SUT VICRYL 0 ENDOLOOP (SUTURE) IMPLANT
TOWEL OR 17X26 10 PK STRL BLUE (TOWEL DISPOSABLE) ×2 IMPLANT
TOWEL OR NON WOVEN STRL DISP B (DISPOSABLE) ×1 IMPLANT
TRAY LAPAROSCOPIC (CUSTOM PROCEDURE TRAY) ×2 IMPLANT
TROCAR BLADELESS OPT 5 100 (ENDOMECHANICALS) ×2 IMPLANT
TROCAR XCEL NON-BLD 11X100MML (ENDOMECHANICALS) ×2 IMPLANT

## 2020-09-25 NOTE — Transfer of Care (Signed)
Immediate Anesthesia Transfer of Care Note  Patient: [name]  Procedure(s) Performed: LAPAROSCOPIC CHOLECYSTECTOMY  Patient Location: PACU  Anesthesia Type:General  Level of Consciousness: awake, alert  and oriented  Airway & Oxygen Therapy: Patient Spontanous Breathing and Patient connected to face mask oxygen  Post-op Assessment: Report given to RN and Post -op Vital signs reviewed and stable  Post vital signs: Reviewed and stable  Last Vitals:  Vitals Value Taken Time  BP 115/59 09/25/20 1722  Temp    Pulse 89 09/25/20 1725  Resp 24 09/25/20 1725  SpO2 92 % 09/25/20 1725  Vitals shown include unvalidated device data.  Last Pain:  Vitals:   09/25/20 1300  TempSrc:   PainSc: 2       Patients Stated Pain Goal: 3 ([id number])  Complications: No notable events documented.

## 2020-09-25 NOTE — Discharge Instructions (Signed)
[facility] ______[facility] LAPAROSCOPIC SURGERY: POST OP INSTRUCTIONS Always review your discharge instruction sheet given to you by the facility where your surgery was performed. IF YOU HAVE DISABILITY OR FAMILY LEAVE FORMS, YOU MUST BRING THEM TO THE OFFICE FOR PROCESSING.   DO NOT GIVE THEM TO YOUR DOCTOR.  A prescription for pain medication may be given to you upon discharge.  Take your pain medication as prescribed, if needed.  If narcotic pain medicine is not needed, then you may take acetaminophen (Tylenol) or ibuprofen (Advil) as needed. Take your usually prescribed medications unless otherwise directed. If you need a refill on your pain medication, please contact your pharmacy.  They will contact our office to request authorization. Prescriptions will not be filled after 5pm or on week-ends. You should follow a light diet the first few days after arrival home, such as soup and crackers, etc.  Be sure to include lots of fluids daily. Most patients will experience some swelling and bruising in the area of the incisions.  Ice packs will help.  Swelling and bruising can take several days to resolve.  It is common to experience some constipation if taking pain medication after surgery.  Increasing fluid intake and taking a stool softener (such as Colace) will usually help or prevent this problem from occurring.  A mild laxative (Milk of Magnesia or Miralax) should be taken according to package instructions if there are no bowel movements after 48 hours. Unless discharge instructions indicate otherwise, you may remove your bandages 24-48 hours after surgery, and you may shower at that time.  You may have steri-strips (small skin tapes) in place directly over the incision.  These strips should be left on the skin for 7-10 days.  If your surgeon used skin glue on the incision, you may shower in 24 hours.  The glue will flake off over the next 2-3 weeks.  Any sutures or staples will be  removed at the office during your follow-up visit. ACTIVITIES:  You may resume regular (light) daily activities beginning the next day--such as daily self-care, walking, climbing stairs--gradually increasing activities as tolerated.  You may have sexual intercourse when it is comfortable.  Refrain from any heavy lifting or straining until approved by your doctor. You may drive when you are no longer taking prescription pain medication, you can comfortably wear a seatbelt, and you can safely maneuver your car and apply brakes. RETURN TO WORK:  __________________________________________________________ You should see your doctor in the office for a follow-up appointment approximately 2-3 weeks after your surgery.  Make sure that you call for this appointment within a day or two after you arrive home to insure a convenient appointment time. OTHER INSTRUCTIONS: __________________________________________________________________________________________________________________________ __________________________________________________________________________________________________________________________ WHEN TO CALL YOUR DOCTOR: Fever over 101.0 Inability to urinate Continued bleeding from incision. Increased pain, redness, or drainage from the incision. Increasing abdominal pain  The clinic staff is available to answer your questions during regular business hours.  Please don't hesitate to call and ask to speak to one of the nurses for clinical concerns.  If you have a medical emergency, go to the nearest emergency room or call 911.  A surgeon from [facility] is always on call at the hospital. [address], [address], [place], [place]  [address] ? [address] [address], [place], [place]   [address] [phone] ? [phone] ? FAX [phone] Web site: [email or web address]  

## 2020-09-25 NOTE — Op Note (Signed)
PATIENT:  Natalie Baker  66 y.o. female  PRE-OPERATIVE DIAGNOSIS:  CHRONIC CHOLECYSTITIS  POST-OPERATIVE DIAGNOSIS:  chronic cholecystitis  PROCEDURE:  Procedure(s): LAPAROSCOPIC CHOLECYSTECTOMY   SURGEON:  Leah Skora, Arta Bruce, MD   ASSISTANT: Drucie Ip, M.D.  ANESTHESIA:   local and general  Indications for procedure: Natalie Baker is a 65 y.o. female with symptoms of Abdominal pain and Nausea and vomiting consistent with gallbladder disease, Confirmed by ultrasound.  Description of procedure: The patient was brought into the operative suite, placed supine. Anesthesia was administered with endotracheal tube. Patient was strapped in place and foot board was secured. All pressure points were offloaded by foam padding. The patient was prepped and draped in the usual sterile fashion.  A periumbilical incision was made and optical entry was used to enter the abdomen. 2 5 mm trocars were placed on in the right lateral space on in the right subcostal space. A 15m trocar was placed in the subxiphoid space. Marcaine was infused to the subxiphoid space and lateral upper right abdomen in the transversus abdominis plane. Next the patient was placed in reverse trendelenberg. The gallbladder appearedchronically inflamed. Omentum was adhered to the gallbladder and was taken down with cautery/blunt dissection.  The gallbladder was retracted cephalad and lateral. The peritoneum was reflected off the infundibulum working lateral to medial. The cystic duct and cystic artery were identified and further dissection revealed a critical view. The cystic duct and cystic artery were doubly clipped and ligated.   The gallbladder was removed off the liver bed with cautery. The Gallbladder was placed in a specimen bag. The gallbladder fossa was irrigated and hemostasis was applied with cautery. The gallbladder was removed via the 140mtrocar. The fascial defect was closed with interrupted 0 vicryl suture via  laparoscopic trans-fascial suture passer. Pneumoperitoneum was removed, all trocar were removed. All incisions were closed with 4-0 monocryl subcuticular stitch. The patient woke from anesthesia and was brought to PACU in stable condition. All counts were correct  Findings: chronically inflamed gallbladder  Specimen: gallbladder  Blood loss: 10 ml  Local anesthesia: 20 ml Marcaine  Complications: none  PLAN OF CARE: Discharge to home after PACU  PATIENT DISPOSITION:  PACU - hemodynamically stable.  [facility] [place]

## 2020-09-25 NOTE — Anesthesia Postprocedure Evaluation (Signed)
Anesthesia Post Note  Patient: Natalie Baker  Procedure(s) Performed: LAPAROSCOPIC CHOLECYSTECTOMY     Patient location during evaluation: PACU Anesthesia Type: General Level of consciousness: awake and alert, oriented and patient cooperative Pain management: pain level controlled Vital Signs Assessment: post-procedure vital signs reviewed and stable Respiratory status: spontaneous breathing, nonlabored ventilation and respiratory function stable Cardiovascular status: blood pressure returned to baseline and stable Postop Assessment: no apparent nausea or vomiting Anesthetic complications: no   No notable events documented.  Last Vitals:  Vitals:   09/25/20 1730 09/25/20 1745  BP: (!) 102/48 (!) 95/50  Pulse: 84 86  Resp: 14 20  Temp:    SpO2: 94% 97%    Last Pain:  Vitals:   09/25/20 1745  TempSrc:   PainSc: [place]

## 2020-09-25 NOTE — H&P (Signed)
Chief Complaint: Cholelithiasis   History of Present Illness: Natalie Baker is a 65 y.o. female who is seen today as an office consultation at the request of Dr. Wilma Flavin for evaluation of Cholelithiasis .   Natalie Baker has had pain located in the RUQ. It first started 2 years ago. It occurs every day. It is worsened with eating. She has nausea. She has had loose stools.  She has been treated for lymphoma and is working toward stem cell treatment later this year. 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 Clayborne Dana team.  Review of Systems: A complete review of systems was obtained from the patient. I have reviewed this information and discussed as appropriate with the patient. See HPI as well for other ROS.  Review of Systems  Gastrointestinal: Positive for abdominal pain and nausea.  All other systems reviewed and are negative.   Medical History: Past Medical History:  Diagnosis Date   CHF (congestive heart failure) (CMS-HCC)   History of cancer   Thyroid disease   There is no problem list on file for this patient.  Past Surgical History:  Procedure Laterality Date   CESAREAN SECTION 1983   HYSTERECTOMY    Allergies  Allergen Reactions   Tuberculin, Purified Protein Derivative Unknown  Positive reaction. Negative Chest Xray 06/16/12   Current Outpatient Medications on File Prior to Visit  Medication Sig Dispense Refill   acalabrutinib (CALQUENCE) 100 mg capsule Take 100 mg by mouth 2 (two) times daily   potassium chloride (K-TAB) 20 mEq TbER ER tablet potassium chloride ER 20 mEq tablet,extended release TAKE 1 TABLET BY MOUTH EVERY DAY   spironolactone (ALDACTONE) 25 MG tablet spironolactone 25 mg tablet   thyroid (ARMOUR THYROID) 90 mg tablet Armour Thyroid 90 mg tablet TAKE 1 TABLET BY MOUTH IN THE MORNING ON EMPTY STOMACH   carvediloL (COREG) 6.25 MG tablet carvedilol 6.25 mg  tablet TAKE 1 TABLET BY MOUTH TWICE A DAY   estradiol (VIVELLE-DOT) patch 0.05 mg/24 hr estradiol 0.05 mg/24 hr semiweekly transdermal patch APPLY 1 PATCH TRANSDERMALLY TWICE A WEEK   FARXIGA 10 mg tablet TAKE 1 TABLET BY MOUTH DAILY BEFORE BREAKFAST.   losartan (COZAAR) 25 MG tablet losartan 25 mg tablet TAKE 1 TABLET (25 MG TOTAL) BY MOUTH IN THE MORNING AND AT BEDTIME.   TORsemide (DEMADEX) 20 MG tablet torsemide 20 mg tablet TAKE 1 TABLET BY MOUTH EVERY DAY   No current facility-administered medications on file prior to visit.   Family History  Problem Relation Age of Onset   Skin cancer Mother   Coronary Artery Disease (Blocked arteries around heart) Mother   Diabetes Mother   High blood pressure (Hypertension) Father   Coronary Artery Disease (Blocked arteries around heart) Father   High blood pressure (Hypertension) Sister   Diabetes Sister   Breast cancer Sister   Diabetes Brother   Hyperlipidemia (Elevated cholesterol) Brother   High blood pressure (Hypertension) Brother    Social History   Tobacco Use  Smoking Status Former Smoker   Quit date: 1997   Years since quitting: 25.6  Smokeless Tobacco Never Used    Social History   Socioeconomic History   Marital status: Married  Tobacco Use   Smoking status: Former Smoker  Quit date: 1997  Years since quitting: 25.6   Smokeless tobacco: Never Used  Substance and Sexual Activity   Alcohol use: Never   Drug use: Never  Objective:   Vitals:  09/06/20 0935  BP: 128/78  Pulse: 103  Temp: 36.4 C (97.6 F)  SpO2: 98%  Weight: 91 kg (200 lb 9.6 oz)  Height: 157.5 cm (5\' 2" )   Body mass index is 36.69 kg/m.  Physical Exam Constitutional:  Appearance: Normal appearance.  HENT:  Head: Normocephalic and atraumatic.  Pulmonary:  Effort: Pulmonary effort is normal.  Abdominal:  Comments: Epigastric tenderness  Musculoskeletal:  General: Normal range of motion.  Cervical back: Normal range of motion.   Neurological:  General: No focal deficit present.  Mental Status: She is alert and oriented to person, place, and time. Mental status is at baseline.  Psychiatric:  Mood and Affect: Mood normal.  Behavior: Behavior normal.  Thought Content: Thought content normal.     Labs, Imaging and Diagnostic Testing: I reviewd notes from [facility] and Dr. Earlie Server Abdominal ultrasound results were reviewed showing gallstones, wall thickening and fluid in 2021. CT from 2022 showing gallbladder with stones, no fluid or wall thickening. She has 2 large lymph nodes in her abdomen.  Assessment and Plan:  Diagnoses and all orders for this visit:  Calculus of gallbladder with chronic cholecystitis without obstruction - [facility] Case Posting Request; Future    Natalie Baker is a 65 y.o. female with evidence of gallbladder disease. We discussed the etiology of gallstones and biliary disease and that can cause pain. We discussed exacerbating factors including fatty meals. We discussed the details of surgery for removal of the gallbladder including general anesthesia, 4 small incisions in the patient's abdomen, removal of the patient's gallbladder with the liver and common bile duct, and most likely an outpatient procedure. We discussed risks of common bile duct injury, cystic duct stump leak, injury to liver, bleeding, infection, need for open procedure, and post cholecystectomy syndrome. She showed good understanding and wanted to proceed with laparoscopic cholecystectomy.  She will stop her lymphoma medication 2 days prior to surgery

## 2020-09-26 ENCOUNTER — Encounter (HOSPITAL_COMMUNITY): Payer: Self-pay | Admitting: General Surgery

## 2020-09-27 LAB — SURGICAL PATHOLOGY

## 2020-09-29 ENCOUNTER — Inpatient Hospital Stay: Payer: BC Managed Care – PPO | Attending: Physician Assistant

## 2020-09-29 ENCOUNTER — Other Ambulatory Visit: Payer: Self-pay

## 2020-09-29 ENCOUNTER — Encounter (HOSPITAL_COMMUNITY): Payer: Self-pay

## 2020-09-29 ENCOUNTER — Ambulatory Visit (HOSPITAL_COMMUNITY)
Admission: RE | Admit: 2020-09-29 | Discharge: 2020-09-29 | Disposition: A | Discharge status: Home / Self Care | Payer: BC Managed Care – PPO | Source: Ambulatory Visit | Attending: Internal Medicine | Admitting: Internal Medicine

## 2020-09-29 DIAGNOSIS — E039 Hypothyroidism, unspecified: Secondary | ICD-10-CM | POA: Insufficient documentation

## 2020-09-29 DIAGNOSIS — C8318 Mantle cell lymphoma, lymph nodes of multiple sites: Secondary | ICD-10-CM | POA: Diagnosis not present

## 2020-09-29 DIAGNOSIS — Z79899 Other long term (current) drug therapy: Secondary | ICD-10-CM | POA: Insufficient documentation

## 2020-09-29 DIAGNOSIS — Z9221 Personal history of antineoplastic chemotherapy: Secondary | ICD-10-CM | POA: Insufficient documentation

## 2020-09-29 DIAGNOSIS — R911 Solitary pulmonary nodule: Secondary | ICD-10-CM | POA: Diagnosis not present

## 2020-09-29 DIAGNOSIS — C8313 Mantle cell lymphoma, intra-abdominal lymph nodes: Secondary | ICD-10-CM | POA: Diagnosis not present

## 2020-09-29 DIAGNOSIS — Z7984 Long term (current) use of oral hypoglycemic drugs: Secondary | ICD-10-CM | POA: Diagnosis not present

## 2020-09-29 DIAGNOSIS — R590 Localized enlarged lymph nodes: Secondary | ICD-10-CM | POA: Diagnosis not present

## 2020-09-29 DIAGNOSIS — I509 Heart failure, unspecified: Secondary | ICD-10-CM | POA: Diagnosis not present

## 2020-09-29 LAB — CMP (CANCER CENTER ONLY)
ALT: 24 U/L (ref 0–44)
AST: 19 U/L (ref 15–41)
Albumin: 3.6 g/dL (ref 3.5–5.0)
Alkaline Phosphatase: 104 U/L (ref 38–126)
Anion gap: 9 (ref 5–15)
BUN: 13 mg/dL (ref 8–23)
CO2: 29 mmol/L (ref 22–32)
Calcium: 9.9 mg/dL (ref 8.9–10.3)
Chloride: 101 mmol/L (ref 98–111)
Creatinine: 1.02 mg/dL — ABNORMAL HIGH (ref 0.44–1.00)
GFR, Estimated: >60 mL/min (ref >60)
Glucose, Bld: 88 mg/dL (ref 70–99)
Potassium: 4.5 mmol/L (ref 3.5–5.1)
Sodium: 139 mmol/L (ref 135–145)
Total Bilirubin: 0.4 mg/dL (ref 0.3–1.2)
Total Protein: 6.7 g/dL (ref 6.5–8.1)

## 2020-09-29 LAB — CBC WITH DIFFERENTIAL (CANCER CENTER ONLY)
Abs Immature Granulocytes: 0.05 10*3/uL (ref 0.00–0.07)
Basophils Absolute: 0 10*3/uL (ref 0.0–0.1)
Basophils Relative: 1 %
Eosinophils Absolute: 0.1 10*3/uL (ref 0.0–0.5)
Eosinophils Relative: 1 %
HCT: 44.9 % (ref 36.0–46.0)
Hemoglobin: 14 g/dL (ref 12.0–15.0)
Immature Granulocytes: 1 %
Lymphocytes Relative: 14 %
Lymphs Abs: 0.9 10*3/uL (ref 0.7–4.0)
MCH: 27.1 pg (ref 26.0–34.0)
MCHC: 31.2 g/dL (ref 30.0–36.0)
MCV: 86.8 fL (ref 80.0–100.0)
Monocytes Absolute: 0.8 10*3/uL (ref 0.1–1.0)
Monocytes Relative: 12 %
Neutro Abs: 4.5 10*3/uL (ref 1.7–7.7)
Neutrophils Relative %: 71 %
Platelet Count: 176 10*3/uL (ref 150–400)
RBC: 5.17 MIL/uL — ABNORMAL HIGH (ref 3.87–5.11)
RDW: 14.6 % (ref 11.5–15.5)
WBC Count: 6.3 10*3/uL (ref 4.0–10.5)
nRBC: 0 % (ref 0.0–0.2)

## 2020-09-29 LAB — LACTATE DEHYDROGENASE: LDH: 189 U/L (ref 98–192)

## 2020-09-29 LAB — URIC ACID: Uric Acid, Serum: 4.1 mg/dL (ref 2.5–7.1)

## 2020-09-29 MED ORDER — IOHEXOL 350 MG/ML SOLN
80.0000 mL | Freq: Once | INTRAVENOUS | Status: AC | PRN
  Administered 2020-09-29: 80 mL via INTRAVENOUS

## 2020-10-02 ENCOUNTER — Inpatient Hospital Stay (HOSPITAL_BASED_OUTPATIENT_CLINIC_OR_DEPARTMENT_OTHER): Payer: BC Managed Care – PPO | Admitting: Internal Medicine

## 2020-10-02 ENCOUNTER — Other Ambulatory Visit: Payer: Self-pay

## 2020-10-02 VITALS — BP 96/68 | HR 93 | Temp 95.9°F | Resp 17 | Wt 197.1 lb

## 2020-10-02 DIAGNOSIS — Z5111 Encounter for antineoplastic chemotherapy: Secondary | ICD-10-CM

## 2020-10-02 DIAGNOSIS — C8313 Mantle cell lymphoma, intra-abdominal lymph nodes: Secondary | ICD-10-CM

## 2020-10-02 DIAGNOSIS — Z7984 Long term (current) use of oral hypoglycemic drugs: Secondary | ICD-10-CM | POA: Diagnosis not present

## 2020-10-02 DIAGNOSIS — C8318 Mantle cell lymphoma, lymph nodes of multiple sites: Secondary | ICD-10-CM | POA: Diagnosis not present

## 2020-10-02 DIAGNOSIS — Z9221 Personal history of antineoplastic chemotherapy: Secondary | ICD-10-CM | POA: Diagnosis not present

## 2020-10-02 DIAGNOSIS — I509 Heart failure, unspecified: Secondary | ICD-10-CM | POA: Diagnosis not present

## 2020-10-02 DIAGNOSIS — Z79899 Other long term (current) drug therapy: Secondary | ICD-10-CM | POA: Diagnosis not present

## 2020-10-02 DIAGNOSIS — E039 Hypothyroidism, unspecified: Secondary | ICD-10-CM | POA: Diagnosis not present

## 2020-10-02 NOTE — Progress Notes (Signed)
[facility] [phone]   [phone]  OFFICE PROGRESS NOTE  [name], MD [address] [place] [address]  DIAGNOSIS: Stage IV non-Hodgkin's lymphoma, mantle cell lymphoma.  She presented with left iliac and retroperitoneal lymphadenopathy.  She also presented with left axillary lymphadenopathy and bone marrow involvement.  She was diagnosed in December 2020.   PRIOR THERAPY: Systemic chemotherapy with R-CHOP with Neulasta support.  First dose on 01/27/2019. Status post 6 cycles.    CURRENT THERAPY: Acalabrutinib 100 mg p.o. twice daily.  First dose started June 17, 2020.  Status post 2 months of treatment.  INTERVAL HISTORY: Natalie Baker 65 y.o. female returns to the clinic today for follow-up visit.  Her daughter [name] was available by phone during the visit.  The patient recently underwent laparoscopic cholecystectomy on 09/25/2020.  Her treatment with acalabrutinib was on hold for few days during the surgery.  She is feeling much better now her recovering well.  She denied having any current chest pain, shortness of breath, cough or hemoptysis.  She denied having any fever or chills.  She has no nausea, vomiting, diarrhea or constipation.  She has no headache or visual changes.  She is here today for evaluation and repeat blood work.   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  MEDICATIONS:  Current Outpatient Medications  Medication Sig Dispense Refill   acalabrutinib (CALQUENCE) 100 MG capsule Take 1 capsule  (100 mg total) by mouth 2 (two) times daily. 60 capsule 3   acetaminophen (TYLENOL) 500 MG tablet Take 500 mg by mouth every 6 (six) hours as needed for moderate pain.     ARMOUR THYROID 90 MG tablet Take 1 tablet (90 mg total) by mouth every morning. 90 tablet 1   carvedilol (COREG) 6.25 MG tablet Take 3.125 mg by mouth 2 (two) times daily with a meal.     cetirizine (ZYRTEC) 10 MG tablet Take 5 mg by mouth daily as needed for allergies.     estradiol (VIVELLE-DOT) 0.05 MG/24HR patch Place 1 patch onto the skin 2 (two) times a week.     FARXIGA 10 MG TABS tablet TAKE 1 TABLET (10 MG TOTAL) BY MOUTH DAILY BEFORE BREAKFAST. 90 tablet 1   ibuprofen (ADVIL) 800 MG tablet Take 1 tablet (800 mg total) by mouth every 8 (eight) hours as needed. 30 tablet 0   losartan (COZAAR) 25 MG tablet Take 25 mg by mouth at bedtime.     oxyCODONE (OXY IR/ROXICODONE) 5 MG immediate release tablet Take 1 tablet (5 mg total) by mouth every 6 (six) hours as needed for severe pain. 20 tablet 0   Potassium Chloride ER 20 MEQ TBCR Take 1 tablet by mouth as needed. Take with Torsemide (Patient taking differently: Take 20 mEq by mouth daily as needed (when taking second 10 mg dose of torsemide).) 90 tablet 3   spironolactone (ALDACTONE) 25 MG tablet TAKE 1 TABLET BY MOUTH  EVERY DAY 90 tablet 3   torsemide (DEMADEX) 20 MG tablet Take 10 mg by mouth 2 (two) times daily as needed (swelling).     No current facility-administered medications for this visit.    SURGICAL HISTORY:  Past Surgical History:  Procedure Laterality Date   ABDOMINAL HYSTERECTOMY  1997   CESAREAN SECTION  1983   CHOLECYSTECTOMY N/A 09/25/2020   Procedure: LAPAROSCOPIC CHOLECYSTECTOMY;  Surgeon: Kinsinger, Arta Bruce, MD;  Location: [facility];  Service: General;  Laterality: N/A;  60 MINUTES   COLONOSCOPY W/ BIOPSIES  12/29/2006   IR IMAGING GUIDED PORT INSERTION  01/15/2019   LAPAROSCOPY     1991 for emdometriosis   RIGHT/LEFT HEART CATH AND CORONARY  ANGIOGRAPHY N/A 06/25/2019   Procedure: RIGHT/LEFT HEART CATH AND CORONARY ANGIOGRAPHY;  Surgeon: Jolaine Artist, MD;  Location: [facility];  Service: Cardiovascular;  Laterality: N/A;   UPPER GASTROINTESTINAL ENDOSCOPY      REVIEW OF SYSTEMS:  Constitutional: negative Eyes: negative Ears, nose, mouth, throat, and face: negative Respiratory: negative Cardiovascular: negative Gastrointestinal: negative Genitourinary:negative Integument/breast: negative Hematologic/lymphatic: negative Musculoskeletal:negative Neurological: negative Behavioral/Psych: negative Endocrine: negative Allergic/Immunologic: negative   PHYSICAL EXAMINATION: General appearance: alert, cooperative, and no distress Head: Normocephalic, without obvious abnormality, atraumatic Neck: no adenopathy, no JVD, supple, symmetrical, trachea midline, and thyroid not enlarged, symmetric, no tenderness/mass/nodules Lymph nodes: Cervical, supraclavicular, and axillary nodes normal. Resp: clear to auscultation bilaterally Back: symmetric, no curvature. ROM normal. No CVA tenderness. Cardio: regular rate and rhythm, S1, S2 normal, no murmur, click, rub or gallop GI: soft, non-tender; bowel sounds normal; no masses,  no organomegaly Extremities: extremities normal, atraumatic, no cyanosis or edema Neurologic: Alert and oriented X 3, normal strength and tone. Normal symmetric reflexes. Normal coordination and gait  ECOG PERFORMANCE STATUS: 1 - Symptomatic but completely ambulatory  Blood pressure 96/68, pulse 93, temperature (!) 95.9 F (35.5 C), temperature source Tympanic, resp. rate 17, weight 197 lb 1 oz (89.4 kg), SpO2 96 %.  LABORATORY DATA: Lab Results  Component Value Date   WBC 6.3 09/29/2020   HGB 14.0 09/29/2020   HCT 44.9 09/29/2020   MCV 86.8 09/29/2020   PLT 176 09/29/2020      Chemistry      Component Value Date/Time   NA 139 09/29/2020 1016   K 4.5 09/29/2020 1016   CL 101 09/29/2020  1016   CO2 29 09/29/2020 1016   BUN 13 09/29/2020 1016   CREATININE 1.02 (H) 09/29/2020 1016   CREATININE 0.74 12/21/2018 1513      Component Value Date/Time   CALCIUM 9.9 09/29/2020 1016   ALKPHOS 104 09/29/2020 1016   AST 19 09/29/2020 1016   ALT 24 09/29/2020 1016   BILITOT 0.4 09/29/2020 1016       RADIOGRAPHIC STUDIES: CT Chest W Contrast  Result Date: 10/02/2020 CLINICAL DATA:  History of mantle cell lymphoma EXAM: CT CHEST, ABDOMEN, AND PELVIS WITH CONTRAST TECHNIQUE: Multidetector CT imaging of the chest, abdomen and pelvis was performed following the standard protocol during bolus administration of intravenous contrast. CONTRAST:  17mL OMNIPAQUE IOHEXOL 350 MG/ML SOLN COMPARISON:  CT chest abdomen pelvis dated June 09, 2020 FINDINGS: CT CHEST FINDINGS Cardiovascular: Normal heart size. No pericardial effusion. No significant coronary artery calcifications. Minimal atherosclerotic disease of the thoracic aorta. Right chest wall port with tip in superior cavoatrial junction. Mediastinum/Nodes: Esophagus and thyroid are unremarkable. Interval decreased size of bilateral axillary lymph nodes. Reference right retropectoral lymph node measuring 0.6 cm in short  axis on series 2, image 11, previously 1.2 cm. Reference left axillary lymph node measuring 0.6 cm in short axis on series 2, image 9, previously 1.1 cm. Interval increased size of paraesophageal lymph nodes. Reference paraesophageal lymph node measuring 0.8 cm in short axis on series 2, image 25, previously 0.6 cm. Lungs/Pleura: Central airways are patent. No consolidation, pleural effusion or pneumothorax. Stable solid tissue nodule of the right middle lobe measuring 7 mm on series 4, image 69. Stable right lower lobe pulmonary nodule measuring 4 mm on series 4, image 84. Musculoskeletal: No chest wall mass or suspicious bone lesions identified. CT ABDOMEN PELVIS FINDINGS Hepatobiliary: No focal liver abnormality is seen. Status post  cholecystectomy with mild soft tissue stranding of the gallbladder fossa which is likely postsurgical. No biliary dilatation. Pancreas: Unremarkable. No pancreatic ductal dilatation or surrounding inflammatory changes. Spleen: Spleen is increased in size, with a vertical length of 13.0 cm on coronal image, previously 8.5 cm. Adrenals/Urinary Tract: Adrenal glands are unremarkable. Kidneys are normal, without renal calculi, focal lesion, or hydronephrosis. Bladder is unremarkable. Stomach/Bowel: Stomach is within normal limits. Appendix appears normal. No evidence of bowel wall thickening, distention, or inflammatory changes. Vascular/Lymphatic: Interval decreased size of right. Interval decreased size of mesenteric lymph nodes/masses of the right hemiabdomen. Reference ileocolic nodule on series 2, image 79 measures 2.5 x 2.0 cm, previously 3.4 x 3.6 cm. Decreased size of lymph nodes adjacent to the pancreatic tail/splenic hilum. Reference lymph node measures 0.8 cm in short axis on series 2, image 56, previously 1.3 cm. Retroperitoneal periaortic lymph nodes are increased in size compared to prior exam. Largest is a left periaortic lymph node measuring 1.4 cm in short axis on series 2, image 65, previously 0.9 cm. Decreased size of pelvic lymph nodes. Largest is a left external iliac lymph node measuring 1.4 cm in short axis on series 2, image 96, previously 2.4 cm. Mild atherosclerotic disease of the thoracic aorta. Reproductive: Status post hysterectomy. No adnexal masses. Other: No abdominal wall hernia or abnormality. No abdominopelvic ascites. Musculoskeletal: No acute or significant osseous findings. IMPRESSION: Decreased size of mesenteric lymph nodes/masses of the right hemiabdomen, left external iliac lymph node, and lymph nodes adjacent to the pancreatic tail/splenic hilum. Mild increased size of mediastinal paraesophageal nodes and retroperitoneal periaortic lymph nodes. Spleen is increased in size, with  a vertical length of 13.0 cm, previously 8.5 cm. Postsurgical changes of recent cholecystectomy. Aortic Atherosclerosis (ICD10-I70.0). Electronically Signed   By: Yetta Glassman M.D.   On: 10/02/2020 09:52   CT Abdomen Pelvis W Contrast  Result Date: 10/02/2020 CLINICAL DATA:  History of mantle cell lymphoma EXAM: CT CHEST, ABDOMEN, AND PELVIS WITH CONTRAST TECHNIQUE: Multidetector CT imaging of the chest, abdomen and pelvis was performed following the standard protocol during bolus administration of intravenous contrast. CONTRAST:  37mL OMNIPAQUE IOHEXOL 350 MG/ML SOLN COMPARISON:  CT chest abdomen pelvis dated June 09, 2020 FINDINGS: CT CHEST FINDINGS Cardiovascular: Normal heart size. No pericardial effusion. No significant coronary artery calcifications. Minimal atherosclerotic disease of the thoracic aorta. Right chest wall port with tip in superior cavoatrial junction. Mediastinum/Nodes: Esophagus and thyroid are unremarkable. Interval decreased size of bilateral axillary lymph nodes. Reference right retropectoral lymph node measuring 0.6 cm in short axis on series 2, image 11, previously 1.2 cm. Reference left axillary lymph node measuring 0.6 cm in short axis on series 2, image 9, previously 1.1 cm. Interval increased size of paraesophageal lymph nodes. Reference paraesophageal lymph node measuring 0.8 cm in  short axis on series 2, image 25, previously 0.6 cm. Lungs/Pleura: Central airways are patent. No consolidation, pleural effusion or pneumothorax. Stable solid tissue nodule of the right middle lobe measuring 7 mm on series 4, image 69. Stable right lower lobe pulmonary nodule measuring 4 mm on series 4, image 84. Musculoskeletal: No chest wall mass or suspicious bone lesions identified. CT ABDOMEN PELVIS FINDINGS Hepatobiliary: No focal liver abnormality is seen. Status post cholecystectomy with mild soft tissue stranding of the gallbladder fossa which is likely postsurgical. No biliary dilatation.  Pancreas: Unremarkable. No pancreatic ductal dilatation or surrounding inflammatory changes. Spleen: Spleen is increased in size, with a vertical length of 13.0 cm on coronal image, previously 8.5 cm. Adrenals/Urinary Tract: Adrenal glands are unremarkable. Kidneys are normal, without renal calculi, focal lesion, or hydronephrosis. Bladder is unremarkable. Stomach/Bowel: Stomach is within normal limits. Appendix appears normal. No evidence of bowel wall thickening, distention, or inflammatory changes. Vascular/Lymphatic: Interval decreased size of right. Interval decreased size of mesenteric lymph nodes/masses of the right hemiabdomen. Reference ileocolic nodule on series 2, image 79 measures 2.5 x 2.0 cm, previously 3.4 x 3.6 cm. Decreased size of lymph nodes adjacent to the pancreatic tail/splenic hilum. Reference lymph node measures 0.8 cm in short axis on series 2, image 56, previously 1.3 cm. Retroperitoneal periaortic lymph nodes are increased in size compared to prior exam. Largest is a left periaortic lymph node measuring 1.4 cm in short axis on series 2, image 65, previously 0.9 cm. Decreased size of pelvic lymph nodes. Largest is a left external iliac lymph node measuring 1.4 cm in short axis on series 2, image 96, previously 2.4 cm. Mild atherosclerotic disease of the thoracic aorta. Reproductive: Status post hysterectomy. No adnexal masses. Other: No abdominal wall hernia or abnormality. No abdominopelvic ascites. Musculoskeletal: No acute or significant osseous findings. IMPRESSION: Decreased size of mesenteric lymph nodes/masses of the right hemiabdomen, left external iliac lymph node, and lymph nodes adjacent to the pancreatic tail/splenic hilum. Mild increased size of mediastinal paraesophageal nodes and retroperitoneal periaortic lymph nodes. Spleen is increased in size, with a vertical length of 13.0 cm, previously 8.5 cm. Postsurgical changes of recent cholecystectomy. Aortic Atherosclerosis  (ICD10-I70.0). Electronically Signed   By: Yetta Glassman M.D.   On: 10/02/2020 09:52     ASSESSMENT AND PLAN: This is a very pleasant 65 years old white female recently diagnosed with stage IV less aggressive mantle cell lymphoma presented with left iliac and retroperitoneal lymphadenopathy as well as left axillary and bone marrow involvement in December 2020. The patient completed 6 cycles of systemic chemotherapy with R-CHOP.  She tolerated her treatment fairly well with no concerning adverse effects but few months later she developed congestive heart failure with drop in her ejection fraction to the 20-25%.  This has improved over the last few months and currently around 55%.  She is followed by Dr. Haroldine Laws. Unfortunately repeat CT scan in June 2022 showed evidence for disease progression with mild increase in the small bilateral axillary lymph nodes and moderate increase in the left supraclavicular as well as ileocolic mesenteric lymph nodes and left external iliac lymph nodes but mild increase in the periaortic retroperitoneal lymph nodes. The patient is started treatment with acalabrutinib 100 mg p.o. twice daily on June 17, 2020  She was seen by Dr. Laverle Hobby at [facility] on July 12, 2020 for discussion of potential autologous stem cell transplant.   The patient had repeat CT scan of the chest, abdomen pelvis  performed recently that showed decrease in the size of mesenteric lymph node/mass of the right hemiabdomen, left external iliac lymph nodes and lymph nodes adjacent to the pancreatic tail/splenic hilum but there was mild increase in the size of the mediastinal paraesophageal nodes and retroperitoneal periaortic lymph node as well as increase in the size of the spleen. The patient is scheduled to undergo autologous stem cell transplant soon at [facility].  I discussed the scan results with the patient and her daughter and recommended for her to continue her follow-up visit  and evaluation by the transplant team.  She will stop her treatment with acalabrutinib at this point until completion of the transplant process. I will see her back for follow-up visit in 2 months for evaluation and management of her condition after the transplant. She was advised to call immediately if she has any other concerning symptoms in the interval. The patient voices understanding of current disease status and treatment options and is in agreement with the current care plan.  All questions were answered. The patient knows to call the clinic with any problems, questions or concerns. We can certainly see the patient much sooner if necessary.  Disclaimer: This note was dictated with voice recognition software. Similar sounding words can inadvertently be transcribed and may not be corrected upon review.

## 2020-10-03 DIAGNOSIS — C8310 Mantle cell lymphoma, unspecified site: Secondary | ICD-10-CM | POA: Diagnosis not present

## 2020-10-03 DIAGNOSIS — R935 Abnormal findings on diagnostic imaging of other abdominal regions, including retroperitoneum: Secondary | ICD-10-CM | POA: Diagnosis not present

## 2020-10-03 DIAGNOSIS — Z7682 Awaiting organ transplant status: Secondary | ICD-10-CM | POA: Diagnosis not present

## 2020-10-03 DIAGNOSIS — R932 Abnormal findings on diagnostic imaging of liver and biliary tract: Secondary | ICD-10-CM | POA: Diagnosis not present

## 2020-10-03 DIAGNOSIS — R161 Splenomegaly, not elsewhere classified: Secondary | ICD-10-CM | POA: Diagnosis not present

## 2020-10-03 DIAGNOSIS — R911 Solitary pulmonary nodule: Secondary | ICD-10-CM | POA: Diagnosis not present

## 2020-10-04 DIAGNOSIS — Z7682 Awaiting organ transplant status: Secondary | ICD-10-CM | POA: Diagnosis not present

## 2020-10-04 DIAGNOSIS — C8310 Mantle cell lymphoma, unspecified site: Secondary | ICD-10-CM | POA: Diagnosis not present

## 2020-10-05 DIAGNOSIS — Z7682 Awaiting organ transplant status: Secondary | ICD-10-CM | POA: Diagnosis not present

## 2020-10-05 DIAGNOSIS — C8310 Mantle cell lymphoma, unspecified site: Secondary | ICD-10-CM | POA: Diagnosis not present

## 2020-10-09 ENCOUNTER — Telehealth: Payer: Self-pay | Admitting: Medical Oncology

## 2020-10-09 ENCOUNTER — Other Ambulatory Visit (HOSPITAL_COMMUNITY): Payer: Self-pay

## 2020-10-09 NOTE — Telephone Encounter (Signed)
LUQ sharp pain-x 1 week.   Feels like it is pulling and when she takes a breath she feels sharp pain.".   She is taking Ibuprofen 800 mg daily .  Will ask Julien Nordmann to advise.

## 2020-10-09 NOTE — Telephone Encounter (Signed)
Pre transplant concerns from [facility] transplant nurse.  09/28-Abnormal ECHO EF @ 40 %  at [facility]. It needs to be above that .    -" Echodensity vs thickening of the left ventricular apex, cannot rule out  Thrombus".   Re  LUQ pain , [facility] instructed pt to call Dr. Julien Nordmann .  Next  appt with Julien Nordmann is 11/29.

## 2020-10-11 ENCOUNTER — Telehealth: Payer: Self-pay | Admitting: Medical Oncology

## 2020-10-11 NOTE — Telephone Encounter (Signed)
Pt's transplant is postponed due to poor EF and recent PET scan results -" it lit up and shows more inflammation".   Dr Theodoro Clock sent PET scan report  to pts surgeon , Dr. Kieth Brightly, and she told Sharnita to  contact Dr Kieth Brightly  re: her LUQ pain.  [facility] will have her followed by their lymphoma team for now.

## 2020-10-11 NOTE — Telephone Encounter (Signed)
LUQ pain advice-  I left a VM that per Mohamed : "She had cholecystectomy recently.  She will need to reach out to the surgeon".

## 2020-10-12 ENCOUNTER — Telehealth: Payer: Self-pay | Admitting: Medical Oncology

## 2020-10-12 ENCOUNTER — Encounter: Payer: Self-pay | Admitting: Internal Medicine

## 2020-10-12 NOTE — Telephone Encounter (Signed)
?   Restart Acalabrutinib   Now that BM transplant is postponed and the cancer is  " in my bone marrow" does she need to start back on Acalabrutinib?

## 2020-10-17 ENCOUNTER — Telehealth: Payer: Self-pay | Admitting: Medical Oncology

## 2020-10-17 NOTE — Telephone Encounter (Signed)
Please ask Dr. Julien Nordmann to call me at his convenience.

## 2020-10-19 ENCOUNTER — Encounter: Payer: Self-pay | Admitting: Internal Medicine

## 2020-10-19 ENCOUNTER — Encounter (HOSPITAL_COMMUNITY): Payer: Self-pay | Admitting: Internal Medicine

## 2020-10-19 ENCOUNTER — Other Ambulatory Visit: Payer: Self-pay

## 2020-10-19 ENCOUNTER — Ambulatory Visit (HOSPITAL_COMMUNITY)
Admission: RE | Admit: 2020-10-19 | Discharge: 2020-10-19 | Disposition: A | Discharge status: Home / Self Care | Payer: BC Managed Care – PPO | Source: Ambulatory Visit | Attending: Internal Medicine | Admitting: Internal Medicine

## 2020-10-19 VITALS — BP 112/68 | HR 80 | Wt 198.0 lb

## 2020-10-19 DIAGNOSIS — C8316 Mantle cell lymphoma, intrapelvic lymph nodes: Secondary | ICD-10-CM | POA: Diagnosis not present

## 2020-10-19 DIAGNOSIS — E785 Hyperlipidemia, unspecified: Secondary | ICD-10-CM | POA: Diagnosis not present

## 2020-10-19 DIAGNOSIS — I428 Other cardiomyopathies: Secondary | ICD-10-CM | POA: Insufficient documentation

## 2020-10-19 DIAGNOSIS — I110 Hypertensive heart disease with heart failure: Secondary | ICD-10-CM | POA: Insufficient documentation

## 2020-10-19 DIAGNOSIS — Z79890 Hormone replacement therapy (postmenopausal): Secondary | ICD-10-CM | POA: Insufficient documentation

## 2020-10-19 DIAGNOSIS — C8310 Mantle cell lymphoma, unspecified site: Secondary | ICD-10-CM | POA: Insufficient documentation

## 2020-10-19 DIAGNOSIS — Z7722 Contact with and (suspected) exposure to environmental tobacco smoke (acute) (chronic): Secondary | ICD-10-CM | POA: Diagnosis not present

## 2020-10-19 DIAGNOSIS — Z808 Family history of malignant neoplasm of other organs or systems: Secondary | ICD-10-CM | POA: Insufficient documentation

## 2020-10-19 DIAGNOSIS — E669 Obesity, unspecified: Secondary | ICD-10-CM | POA: Diagnosis not present

## 2020-10-19 DIAGNOSIS — Z833 Family history of diabetes mellitus: Secondary | ICD-10-CM | POA: Diagnosis not present

## 2020-10-19 DIAGNOSIS — Z7984 Long term (current) use of oral hypoglycemic drugs: Secondary | ICD-10-CM | POA: Insufficient documentation

## 2020-10-19 DIAGNOSIS — I472 Ventricular tachycardia, unspecified: Secondary | ICD-10-CM | POA: Diagnosis not present

## 2020-10-19 DIAGNOSIS — I5022 Chronic systolic (congestive) heart failure: Secondary | ICD-10-CM | POA: Insufficient documentation

## 2020-10-19 DIAGNOSIS — Z803 Family history of malignant neoplasm of breast: Secondary | ICD-10-CM | POA: Insufficient documentation

## 2020-10-19 DIAGNOSIS — Z8249 Family history of ischemic heart disease and other diseases of the circulatory system: Secondary | ICD-10-CM | POA: Insufficient documentation

## 2020-10-19 DIAGNOSIS — Z79899 Other long term (current) drug therapy: Secondary | ICD-10-CM | POA: Insufficient documentation

## 2020-10-19 DIAGNOSIS — Z87891 Personal history of nicotine dependence: Secondary | ICD-10-CM | POA: Insufficient documentation

## 2020-10-19 DIAGNOSIS — R7303 Prediabetes: Secondary | ICD-10-CM | POA: Diagnosis not present

## 2020-10-19 LAB — BASIC METABOLIC PANEL
Anion gap: 9 (ref 5–15)
BUN: 18 mg/dL (ref 8–23)
CO2: 26 mmol/L (ref 22–32)
Calcium: 9.6 mg/dL (ref 8.9–10.3)
Chloride: 102 mmol/L (ref 98–111)
Creatinine, Ser: 1.05 mg/dL — ABNORMAL HIGH (ref 0.44–1.00)
GFR, Estimated: 59 mL/min — ABNORMAL LOW (ref >60)
Glucose, Bld: 98 mg/dL (ref 70–99)
Potassium: 4.5 mmol/L (ref 3.5–5.1)
Sodium: 137 mmol/L (ref 135–145)

## 2020-10-19 MED ORDER — LOSARTAN POTASSIUM 50 MG PO TABS: 50.0000 mg | ORAL_TABLET | Freq: Two times a day (BID) | ORAL | 6 refills | Status: DC

## 2020-10-19 NOTE — Patient Instructions (Addendum)
Increase Losartan to 50 mg Twice daily   Labs done today, your results will be available in MyChart, we will contact you for abnormal readings.  Your physician recommends that you return for lab work in: 1 week  Your physician has requested that you have a cardiac MRI. Cardiac MRI uses a computer to create images of your heart as its beating, producing both still and moving pictures of your heart and major blood vessels. For further information please visit [email or web address] Please follow the instruction sheet given to you today for more information. ONCE APPROVED BY YOUR INSURANCE COMPANY YOU WILL BE CALLED TO SCHEDULE THIS  Your physician recommends that you schedule a follow-up appointment in: 4-6 weeks  If you have any questions or concerns before your next appointment please send [place] a message through [place] or call our office at [phone].    TO LEAVE A MESSAGE FOR THE NURSE SELECT OPTION 2, PLEASE LEAVE A MESSAGE INCLUDING: YOUR NAME DATE OF BIRTH CALL BACK NUMBER REASON FOR CALL**this is important as we prioritize the call backs  YOU WILL RECEIVE A CALL BACK THE SAME DAY AS LONG AS YOU CALL BEFORE 4:00 PM  At the [facility], you and your health needs are our priority. As part of our continuing mission to provide you with exceptional heart care, we have created designated Provider Care Teams. These Care Teams include your primary Cardiologist (physician) and Advanced Practice Providers (APPs- Physician Assistants and Nurse Practitioners) who all work together to provide you with the care you need, when you need it.   You may see any of the following providers on your designated Care Team at your next follow up: Dr Glori Bickers Dr Loralie Champagne Dr Patrice Paradise, NP Lyda Jester, [place] Ginnie Smart Audry Riles, PharmD   Please be sure to bring in all your medications bottles to every appointment.

## 2020-10-19 NOTE — Progress Notes (Signed)
PCP: Dr Melford Aase Primary Cardiologist: Dr Haroldine Laws  Oncology: Dr Julien Nordmann  Reason for Visit:  F/u for Chronic Systolic Heart Failure   HPI: Natalie Baker is a 65 y.o. female with a hx of palpitations thought to be PVCs, HTN, non hodgkins lymphoma treated w/ R-CHOP and systolic HF.  Admitted 6/21 with acute HF. ECHO  EF 20-25% w/ mod-severe MR.  RV moderately reduced. Developed VT and was started on amiodarone and transferred to [facility] for  [facility] which showed normal coronaries, elevated filling pressures w/ low CO c/w cardiogenic shock. CI was 1.7. She was started on milrinone, IV amiodarone and IV Lasix for diuresis.  Echo 9/21: EF 40-45%   On repeat echo 05/04/20 EF had normalized to 50-55% G2DD.   At oncology f/u 6/22,  repeat CT scan unfortunately showed evidence for disease progression with mild increase in the small bilateral axillary lymph nodes and moderate increase in the left supraclavicular as well as ileocolic mesenteric lymph nodes and left external iliac lymph nodes but mild increase in the periaortic retroperitoneal lymph nodes. She started treatment with acalabrutinib 100 mg p.o. twice daily on June 17 2020 and Dr. Julien Nordmann referred her to [facility] for bone marrow transplant evaluation. As part of w/u at [facility], an echocardiogram was obtained and showed drop in LVEF back down to 40% w/ echodensity vs thickening of the left ventricular apex concerning for possible LV thrombus. Subsequently, she has been referred back to [facility] for further evaluation.   Today in f/u, she reports doing well from a cardiac standpoint w/ stable NYHA Class II symptoms. Wt has been stable at home and she is euvolemic on exam today. Denies orthopnea/PND and no LEE. No CP. Reports full med compliance. BP well controlled at 112/68. On Losartan, Farxiga, Coreg, spironolactone and PRN torsemide. She previously failed Entresto due to dizziness, but willing to retry. She currently takes Losartan 25 mg bid. No further  dizziness. Her only compliant is chronic fatigue which has been present for several months.    Studies:  ECHO 06/2019 EF 20-25%  moderately reduced RV function with severe MR/TR Echo 9/21: EF 40-45%, RV normal  Echo  4/22: EF 50-55% G2DD. RV normal  Echo 9/22 [facility]): EF 40%. RV normal, echodensity vs thickening of the left ventricular apex, cannot rule out  thrombus.   2D Echo [facility] 10/04/20  Summary    1. The left ventricle is normal in size with normal wall thickness.    2. The left ventricular systolic function is moderately decreased, LVEF is  visually estimated at 40%.    3. Echodensity vs thickening of the left ventricular apex, cannot rule out  thrombus.    4. There is grade I diastolic dysfunction (impaired relaxation).    5. There is mild mitral valve regurgitation.    6. The left atrium is mildly dilated in size.    7. The right ventricle is normal in size, with normal systolic function.    8. There is a small, circumferential pericardial effusion.    Recommendations    * Recommend repeat limited echocardiogram with ultrasound enhancing agent to  further assess for possible apical LV thrombus.    Cath 6/18 with normal cors. Low output. Ao = 109/71 (81) LV =  99/23 RA =  15 RV =  43/18 PA =  35/14 (25) PCW = 22 Fick cardiac output/index = 3.3/1.7 PVR = < 1.0 WU Ao sat = 100% PA sat = 49%, 53%  Assessment: 1. Normal coronary  arteries 2. Severe NICM EF 20% 3. Elevated filling pressures and low CO c/w cardiogenic shock 4. Brief run of VT   ROS: All systems negative except as listed in HPI, PMH and Problem List.  SH:  Social History   Socioeconomic History   Marital status: Married    Spouse name: Not on file   Number of children: 1   Years of education: Not on file   Highest education level: Not on file  Occupational History   Occupation: Theatre manager  Tobacco Use   Smoking status: Former    Years: 15.00    Types: Cigarettes    Quit date:  01/08/1996    Years since quitting: 24.7   Smokeless tobacco: Never   Tobacco comments:    quit 1998 but is exposed to 2nd hand smoke  Vaping Use   Vaping Use: Never used  Substance and Sexual Activity   Alcohol use: No   Drug use: No   Sexual activity: Yes    Partners: Male    Birth control/protection: Post-menopausal  Other Topics Concern   Not on file  Social History Narrative   Married, one daughter, 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   Social Determinants of Radio broadcast assistant Strain: Not on file  Food Insecurity: Not on file  Transportation Needs: Not on file  Physical Activity: Not on file  Stress: Not on file  Social Connections: Not on file  Intimate Partner Violence: Not on file    FH:  Family History  Problem Relation Age of Onset   Heart disease Mother 32       chf   Diabetes Mother    Thyroid disease Mother    Cancer Mother 31       female/ skin   Hypertension Mother    Liver disease Mother    Hypertension Father    Alcohol abuse Father    Heart disease Father    Breast cancer Maternal Aunt    Hyperlipidemia Maternal Aunt    Heart disease Maternal Aunt    Hyperlipidemia Brother    Hypertension Brother        x 2   Diabetes Brother    Hypothyroidism Brother    Liver disease Maternal Grandmother    Diabetes Paternal Grandmother    Bone cancer Maternal Aunt        and skull   Breast cancer Sister 1   Diabetes Sister    Hypothyroidism Sister    Hashimoto's thyroiditis Sister    Breast cancer Niece 30   Colon cancer Neg Hx    Colon polyps Neg Hx    Esophageal cancer Neg Hx    Rectal cancer Neg Hx    Stomach cancer Neg Hx     Past Medical History:  Diagnosis Date   Allergy    Cancer ([place])    being worked up for lymphoma   CHF (congestive heart failure) ([place])    Colon polyp 12/29/2006   Dysrhythmia    Frozen shoulder 11/16/2015   Hyperlipidemia    Hypothyroidism    Obesity    BMI 34   Osteoporosis     Palpitations    Pre-diabetes    Vitamin D deficiency     Current Outpatient Medications  Medication Sig Dispense Refill   Acalabrutinib Maleate (CALQUENCE) 100 MG TABS Take 1 tablet by mouth in the morning and at bedtime.     acetaminophen (TYLENOL) 500 MG tablet Take 500 mg by  mouth every 6 (six) hours as needed for moderate pain.     ARMOUR THYROID 90 MG tablet Take 1 tablet (90 mg total) by mouth every morning. 90 tablet 1   carvedilol (COREG) 6.25 MG tablet Take 3.125 mg by mouth 2 (two) times daily with a meal.     cetirizine (ZYRTEC) 10 MG tablet Take 5 mg by mouth daily as needed for allergies.     estradiol (VIVELLE-DOT) 0.05 MG/24HR patch Place 1 patch onto the skin 2 (two) times a week.     FARXIGA 10 MG TABS tablet TAKE 1 TABLET (10 MG TOTAL) BY MOUTH DAILY BEFORE BREAKFAST. 90 tablet 1   ibuprofen (ADVIL) 800 MG tablet Take 1 tablet (800 mg total) by mouth every 8 (eight) hours as needed. 30 tablet 0   losartan (COZAAR) 25 MG tablet Take 25 mg by mouth at bedtime.     Potassium Chloride ER 20 MEQ TBCR Take 1 tablet by mouth as needed. Take with Torsemide 90 tablet 3   spironolactone (ALDACTONE) 25 MG tablet TAKE 1 TABLET BY MOUTH EVERY DAY 90 tablet 3   torsemide (DEMADEX) 20 MG tablet Take 10 mg by mouth 2 (two) times daily as needed (swelling).     No current facility-administered medications for this encounter.    Vitals:   10/19/20 1351  BP: 112/68  Pulse: 80  SpO2: 96%  Weight: 89.8 kg (198 lb)   Wt Readings from Last 3 Encounters:  10/19/20 89.8 kg (198 lb)  10/02/20 89.4 kg (197 lb 1 oz)  09/21/20 90.7 kg (200 lb)   PHYSICAL EXAM: General:  Well appearing, moderately obese. No respiratory difficulty HEENT: normal Neck: supple. no JVD. Carotids 2+ bilat; no bruits. No lymphadenopathy or thyromegaly appreciated. Cor: PMI nondisplaced. Regular rate & rhythm. No rubs, gallops or murmurs. Lungs: clear Abdomen: soft, nontender, nondistended. No  hepatosplenomegaly. No bruits or masses. Good bowel sounds. Extremities: no cyanosis, clubbing, rash, edema Neuro: alert & oriented x 3, cranial nerves grossly intact. moves all 4 extremities w/o difficulty. Affect pleasant.   ASSESSMENT & PLAN:  1. Chronic Systolic HF  - Echo 6/04 EF 55-60%. RV normal - Echo 6/21 EF 20-25%  moderately reduced RV function with severe MR/TR - Cath 06/25/19 no CAD. - Drop in EF felt most likely from R-CHOP - Echo 05/04/20 w/ EF 50-55% G2DD. RV normal - Echo 9/22 at [facility] EF back down to 40% w/ ? LV thrombus - NYHA Class II (stable). Euvolemic on exam  - Obtain cMRI to better assess LVEF and r/o LV thrombus - Did not tolerate Entresto previously due to dizziness - Increase Losartan to 50 mg bid - Continue Farxiga 10 mg daily - Continue Spiro 25 mg daily  - Continue Coreg 6.25 mg bid - BMP today and again in 7 days   2. H/o VT - Off Lifevest with recovery of EF - denies palpitations. No syncope/ near syncope    3. Stage IV non-Hodgkin's lymphoma, mantle cell lymphoma   - s/p R-CHOP w/ Neulasta - Evidence of disease progression on f/u Chest CT 6/22 - now on acalabrutinib  - being followed at [facility] for possible bone marrow transplant     Lyda Jester  PA-C  2:05 PM   Patient seen and examined with the above-signed Advanced Practice Provider and/or Housestaff. I personally reviewed laboratory data, imaging studies and relevant notes. I independently examined the patient and formulated the important aspects of the plan. I have edited the note to  reflect any of my changes or salient points. I have personally discussed the plan with the patient and/or family.  Doing well NYHA I-II. Volume status looks good. Has been referred to [facility] for Bone Marrow Transplant but found to have residual lymphoma cells and remains on acalabrutinib . EF also found to be back down at 40% with possible LV clot.   General:  Well appearing. No resp difficulty HEENT:  normal Neck: supple. no JVD. Carotids 2+ bilat; no bruits. No lymphadenopathy or thryomegaly appreciated. Cor: PMI nondisplaced. Regular rate & rhythm. No rubs, gallops or murmurs. Lungs: clear Abdomen: soft, nontender, nondistended. No hepatosplenomegaly. No bruits or masses. Good bowel sounds. Extremities: no cyanosis, clubbing, rash, edema Neuro: alert & orientedx3, cranial nerves grossly intact. moves all 4 extremities w/o difficulty. Affect pleasant  Overall doing well. EF apparently back down on recent echo  with possible LV clot. Will get cMRI to further evaluate. Increase losartan to 50 bid   Glori Bickers, MD  5:00 PM

## 2020-10-23 ENCOUNTER — Other Ambulatory Visit (HOSPITAL_COMMUNITY): Payer: Self-pay | Admitting: Cardiology

## 2020-10-24 ENCOUNTER — Other Ambulatory Visit: Payer: Self-pay | Admitting: Medical Oncology

## 2020-10-24 ENCOUNTER — Telehealth: Payer: Self-pay

## 2020-10-24 ENCOUNTER — Other Ambulatory Visit (HOSPITAL_COMMUNITY): Payer: Self-pay

## 2020-10-24 ENCOUNTER — Encounter: Payer: Self-pay | Admitting: Internal Medicine

## 2020-10-24 DIAGNOSIS — C8313 Mantle cell lymphoma, intra-abdominal lymph nodes: Secondary | ICD-10-CM

## 2020-10-24 MED ORDER — CALQUENCE 100 MG PO TABS
1.0000 | ORAL_TABLET | Freq: Two times a day (BID) | ORAL | 1 refills | Status: DC
Start: 2020-10-24 — End: 2020-12-26
  Filled 2020-10-24: qty 60, fill #0
  Filled 2020-10-24: qty 60, 30d supply, fill #0
  Filled 2020-11-21: qty 60, 30d supply, fill #1

## 2020-10-24 NOTE — Telephone Encounter (Signed)
Oral Oncology Patient Advocate Encounter   Was successful in securing patient a $ 10,000 grant from [employer] [place] [employer]) to provide copayment coverage for his Calquence.  This will keep the out of pocket expense at $0.     I will call the patient to let them know of the approval.  The billing information is as follows and has been shared with [place].   Member ID: [id number] Group ID: [id number] RxBin: [id number] Dates of Eligibility: 07/26/20 through 10/24/21  Fund:  [place] Patient [place] Phone [phone] Fax [phone] 10/24/2020 10:40 AM

## 2020-10-24 NOTE — Telephone Encounter (Signed)
Oral Oncology Patient Advocate Encounter   Was successful in securing patient an $80 grant from Patient [place] [facility]) to provide copayment coverage for Calquence.  This will keep the out of pocket expense at $0.     I have spoken with the patient.    The billing information is as follows and has been shared with [name].   Member ID: [id number] Group ID: [id number] RxBin: [id number] Dates of Eligibility: 07/26/20 through 10/23/21  Fund:  [name] Phone [phone] Fax [phone] 10/24/2020 12:35 PM

## 2020-10-24 NOTE — Telephone Encounter (Signed)
Oral Oncology Patient Advocate Encounter   Received notification from [employer] that prior authorization for Calquence is required.   PA submitted on CoverMyMeds Key [id number] Status is pending   Oral Oncology Clinic will continue to follow.  [place] Patient [place] Phone [phone] Fax [phone] 10/24/2020 10:36 AM

## 2020-10-24 NOTE — Telephone Encounter (Signed)
Oral Oncology Patient Advocate Encounter  Prior Authorization for Calquence has been approved.    PA# [id number] Effective dates: 10/07/20 through 10/24/21  Patients co-pay is $[id number]  Oral Oncology Clinic will continue to follow.   [place] Patient [place] Phone [phone] Fax [phone] 10/24/2020 10:38 AM

## 2020-10-26 ENCOUNTER — Other Ambulatory Visit: Payer: Self-pay

## 2020-10-26 ENCOUNTER — Ambulatory Visit (HOSPITAL_COMMUNITY)
Admission: RE | Admit: 2020-10-26 | Discharge: 2020-10-26 | Disposition: A | Discharge status: Home / Self Care | Payer: BC Managed Care – PPO | Source: Ambulatory Visit | Attending: Internal Medicine | Admitting: Internal Medicine

## 2020-10-26 DIAGNOSIS — I5022 Chronic systolic (congestive) heart failure: Secondary | ICD-10-CM | POA: Insufficient documentation

## 2020-10-26 LAB — BASIC METABOLIC PANEL
Anion gap: 9 (ref 5–15)
BUN: 18 mg/dL (ref 8–23)
CO2: 26 mmol/L (ref 22–32)
Calcium: 9.6 mg/dL (ref 8.9–10.3)
Chloride: 102 mmol/L (ref 98–111)
Creatinine, Ser: 1.08 mg/dL — ABNORMAL HIGH (ref 0.44–1.00)
GFR, Estimated: 57 mL/min — ABNORMAL LOW (ref >60)
Glucose, Bld: 91 mg/dL (ref 70–99)
Potassium: 4.5 mmol/L (ref 3.5–5.1)
Sodium: 137 mmol/L (ref 135–145)

## 2020-10-26 LAB — CBC
HCT: 44.1 % (ref 36.0–46.0)
Hemoglobin: 13.4 g/dL (ref 12.0–15.0)
MCH: 26.4 pg (ref 26.0–34.0)
MCHC: 30.4 g/dL (ref 30.0–36.0)
MCV: 86.8 fL (ref 80.0–100.0)
Platelets: 195 10*3/uL (ref 150–400)
RBC: 5.08 MIL/uL (ref 3.87–5.11)
RDW: 14.8 % (ref 11.5–15.5)
WBC: 6.8 10*3/uL (ref 4.0–10.5)
nRBC: 0 % (ref 0.0–0.2)

## 2020-10-30 ENCOUNTER — Other Ambulatory Visit: Payer: Self-pay | Admitting: Internal Medicine

## 2020-10-30 ENCOUNTER — Encounter: Payer: Self-pay | Admitting: Internal Medicine

## 2020-10-30 DIAGNOSIS — E039 Hypothyroidism, unspecified: Secondary | ICD-10-CM

## 2020-10-30 MED ORDER — LEVOTHYROXINE SODIUM 100 MCG PO TABS: ORAL_TABLET | ORAL | 0 refills | Status: DC

## 2020-11-16 ENCOUNTER — Encounter: Payer: Self-pay | Admitting: Internal Medicine

## 2020-11-16 ENCOUNTER — Telehealth (HOSPITAL_COMMUNITY): Payer: Self-pay | Admitting: Emergency Medicine

## 2020-11-16 NOTE — Telephone Encounter (Signed)
Reaching out to patient to offer assistance regarding upcoming cardiac imaging study; pt verbalizes understanding of appt date/time, parking situation and where to check in, and verified current allergies; name and call back number provided for further questions should they arise Marchia Bond RN Navigator Cardiac Imaging [facility] [phone] office [phone] cell   Denies iv issues Holding diuretics  Denies metal implants Denies claustro

## 2020-11-17 ENCOUNTER — Other Ambulatory Visit: Payer: Self-pay

## 2020-11-17 ENCOUNTER — Ambulatory Visit (HOSPITAL_COMMUNITY)
Admission: RE | Admit: 2020-11-17 | Discharge: 2020-11-17 | Disposition: A | Discharge status: Home / Self Care | Payer: Medicare Other | Source: Ambulatory Visit | Attending: Internal Medicine | Admitting: Internal Medicine

## 2020-11-17 DIAGNOSIS — I5022 Chronic systolic (congestive) heart failure: Secondary | ICD-10-CM | POA: Insufficient documentation

## 2020-11-17 MED ORDER — GADOBUTROL 1 MMOL/ML IV SOLN
12.0000 mL | Freq: Once | INTRAVENOUS | Status: AC | PRN
  Administered 2020-11-17: 12 mL via INTRAVENOUS

## 2020-11-21 ENCOUNTER — Other Ambulatory Visit (HOSPITAL_COMMUNITY): Payer: Self-pay

## 2020-11-21 ENCOUNTER — Encounter: Payer: Self-pay | Admitting: Internal Medicine

## 2020-11-27 ENCOUNTER — Other Ambulatory Visit (HOSPITAL_COMMUNITY): Payer: Self-pay

## 2020-11-28 NOTE — Progress Notes (Signed)
PCP: Dr Melford Aase Primary Cardiologist: Dr Haroldine Laws  Oncology: Dr Julien Nordmann  Reason for Visit:  F/u for Chronic Systolic Heart Failure   HPI: Natalie Baker is a 65 y.o. female with a hx of palpitations thought to be PVCs, HTN, non hodgkins lymphoma treated w/ R-CHOP and systolic HF.  Admitted 6/21 with acute HF. ECHO  EF 20-25% w/ mod-severe MR.  RV moderately reduced. Developed VT and was started on amiodarone and transferred to [facility] for  [facility] which showed normal coronaries, elevated filling pressures w/ low CO c/w cardiogenic shock. CI was 1.7. She was started on milrinone, IV amiodarone and IV Lasix for diuresis.  Echo 9/21: EF 40-45%   On repeat echo 05/04/20 EF had normalized to 50-55% G2DD.   At oncology f/u 6/22,  repeat CT scan unfortunately showed evidence for disease progression with mild increase in the small bilateral axillary lymph nodes and moderate increase in the left supraclavicular as well as ileocolic mesenteric lymph nodes and left external iliac lymph nodes but mild increase in the periaortic retroperitoneal lymph nodes. She started treatment with acalabrutinib 100 mg p.o. twice daily on June 17 2020 and Dr. Julien Nordmann referred her to [facility] for bone marrow transplant evaluation. As part of w/u at [facility], an echocardiogram was obtained and showed drop in LVEF back down to 40% w/ echodensity vs thickening of the left ventricular apex concerning for possible LV thrombus. Subsequently, she has been referred back to [facility] for further evaluation.   Follow up 10/22, stable NYHA II symptoms, losartan increased and cMRI arranged to assess for LV thrombus.   cMRI 11/22 EF 39%, no LV thrombus noted.  Today she returns for HF follow up. Having some nausea from chemo pills and remains fatigued. Not a candidate for stem cell transplant, but will trial CAR-T therapy soon. Has some dizziness when standing too quickly. Mild SOB with physical exertion, but no SOB with walking on flat ground or  going up stairs. Denies palpitations, CP, edema, or PND/Orthopnea. Appetite ok. No fever or chills. Weight at home 196 pounds. Taking all medications. Took prn torsemide 2x this past month.  Studies:  Echo 06/2019 EF 20-25%  moderately reduced RV function with severe MR/TR Echo 9/21: EF 40-45%, RV normal  Echo  4/22: EF 50-55% G2DD. RV normal  Echo [facility] 10/04/20: EF 40%, grade I DD, RV ok, mild MR, mild LAE, small circumferential pericardial effusion, Echodensity vs thickening of the left ventricular apex, cannot rule out thrombus  cMRI 11/22 EF 39%, no LV thrombus  Cath 6/18 with normal cors. Low output. Ao = 109/71 (81) LV =  99/23 RA =  15 RV =  43/18 PA =  35/14 (25) PCW = 22 Fick cardiac output/index = 3.3/1.7 PVR = < 1.0 WU Ao sat = 100% PA sat = 49%, 53%   Assessment: 1. Normal coronary arteries 2. Severe NICM EF 20% 3. Elevated filling pressures and low CO c/w cardiogenic shock 4. Brief run of VT   ROS: All systems negative except as listed in HPI, PMH and Problem List.  SH:  Social History   Socioeconomic History   Marital status: Married    Spouse name: Not on file   Number of children: 1   Years of education: Not on file   Highest education level: Not on file  Occupational History   Occupation: Theatre manager  Tobacco Use   Smoking status: Former    Years: 15.00    Types: Cigarettes  Quit date: 01/08/1996    Years since quitting: 24.9   Smokeless tobacco: Never   Tobacco comments:    quit 1998 but is exposed to 2nd hand smoke  Vaping Use   Vaping Use: Never used  Substance and Sexual Activity   Alcohol use: No   Drug use: No   Sexual activity: Yes    Partners: Male    Birth control/protection: Post-menopausal  Other Topics Concern   Not on file  Social History Narrative   Married, one daughter, 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   Social Determinants of Radio broadcast assistant Strain: Not on file  Food  Insecurity: Not on file  Transportation Needs: Not on file  Physical Activity: Not on file  Stress: Not on file  Social Connections: Not on file  Intimate Partner Violence: Not on file   FH:  Family History  Problem Relation Age of Onset   Heart disease Mother 110       chf   Diabetes Mother    Thyroid disease Mother    Cancer Mother 77       female/ skin   Hypertension Mother    Liver disease Mother    Hypertension Father    Alcohol abuse Father    Heart disease Father    Breast cancer Maternal Aunt    Hyperlipidemia Maternal Aunt    Heart disease Maternal Aunt    Hyperlipidemia Brother    Hypertension Brother        x 2   Diabetes Brother    Hypothyroidism Brother    Liver disease Maternal Grandmother    Diabetes Paternal Grandmother    Bone cancer Maternal Aunt        and skull   Breast cancer Sister 68   Diabetes Sister    Hypothyroidism Sister    Hashimoto's thyroiditis Sister    Breast cancer Niece 44   Colon cancer Neg Hx    Colon polyps Neg Hx    Esophageal cancer Neg Hx    Rectal cancer Neg Hx    Stomach cancer Neg Hx    Past Medical History:  Diagnosis Date   Allergy    Cancer ([place])    being worked up for lymphoma   CHF (congestive heart failure) ([place])    Colon polyp 12/29/2006   Dysrhythmia    Frozen shoulder 11/16/2015   Hyperlipidemia    Hypothyroidism    Obesity    BMI 34   Osteoporosis    Palpitations    Pre-diabetes    Vitamin D deficiency    Current Outpatient Medications  Medication Sig Dispense Refill   Acalabrutinib Maleate (CALQUENCE) 100 MG TABS Take 1 tablet by mouth in the morning and at bedtime. 60 tablet 1   acetaminophen (TYLENOL) 500 MG tablet Take 500 mg by mouth every 6 (six) hours as needed for moderate pain.     carvedilol (COREG) 6.25 MG tablet TAKE 1 TABLET BY MOUTH TWICE A DAY 180 tablet 3   cetirizine (ZYRTEC) 10 MG tablet Take 5 mg by mouth daily as needed for allergies.     estradiol (VIVELLE-DOT) 0.05 MG/24HR  patch Place 1 patch onto the skin 2 (two) times a week.     FARXIGA 10 MG TABS tablet TAKE 1 TABLET (10 MG TOTAL) BY MOUTH DAILY BEFORE BREAKFAST. 90 tablet 1   ibuprofen (ADVIL) 800 MG tablet Take 1 tablet (800 mg total) by mouth every 8 (eight) hours as needed.  30 tablet 0   levothyroxine (SYNTHROID) 100 MCG tablet Take  1 tablet  Daily  on an empty stomach with only water for 30 minutes & no Antacid meds, Calcium or Magnesium for 4 hours & avoid Biotin 90 tablet 0   losartan (COZAAR) 50 MG tablet Take 1 tablet (50 mg total) by mouth in the morning and at bedtime. 60 tablet 6   Potassium Chloride ER 20 MEQ TBCR Take 1 tablet by mouth as needed. Take with Torsemide 90 tablet 3   spironolactone (ALDACTONE) 25 MG tablet TAKE 1 TABLET BY MOUTH EVERY DAY 90 tablet 3   torsemide (DEMADEX) 20 MG tablet Take 10 mg by mouth 2 (two) times daily as needed (swelling).     No current facility-administered medications for this encounter.   BP 100/68   Pulse 84   Wt 90.4 kg (199 lb 6.4 oz)   SpO2 96%   BMI 36.47 kg/m   Wt Readings from Last 3 Encounters:  12/04/20 90.4 kg (199 lb 6.4 oz)  10/19/20 89.8 kg (198 lb)  10/02/20 89.4 kg (197 lb 1 oz)   PHYSICAL EXAM: General:  NAD. No resp difficulty HEENT: Normal Neck: Supple. No JVD. Carotids 2+ bilat; no bruits. No lymphadenopathy or thryomegaly appreciated. Cor: PMI nondisplaced. Regular rate & rhythm. No rubs, gallops or murmurs. Lungs: Clear Abdomen: Obese, nontender, nondistended. No hepatosplenomegaly. No bruits or masses. Good bowel sounds. Extremities: No cyanosis, clubbing, rash, edema Neuro: Alert & oriented x 3, cranial nerves grossly intact. Moves all 4 extremities w/o difficulty. Affect pleasant.  ECG:  NSR 86 bpm (personally reviewed)  ASSESSMENT & PLAN:  1. Chronic Systolic HF  - Echo 6/80 EF 55-60%. RV normal - Echo 6/21 EF 20-25%  moderately reduced RV function with severe MR/TR - Cath 06/25/19 no CAD. - Drop in EF felt most  likely from R-CHOP - Echo 05/04/20 w/ EF 50-55% G2DD. RV normal - Echo 9/22 at [facility] EF back down to 40% w/ ? LV thrombus - cMRI 11/22 EF 39%, no LV clot - Stable NYHA II.Volume looks good today, weight stable. - Continue prn torsemide. - Continue Losartan 50 mg bid. (Did not tolerate Entresto due to dizziness) - Continue Farxiga 10 mg daily. - Continue Spiro 25 mg daily.  - Continue Coreg 6.25 mg bid. - Labs reviewed from [facility] 11/24/20 and look ok.  2. H/o VT - Off Lifevest with recovery of EF. - Denies palpitations. No syncope/ near syncope.    3. Stage IV non-Hodgkin's lymphoma, mantle cell lymphoma   - s/p R-CHOP w/ Neulasta. - Evidence of disease progression on f/u Chest CT 6/22 - Now on acalabrutinib.   - Being followed at [facility] for CART therapy.   Follow up with Dr. Haroldine Laws in 2-3 months after she has recovered from CART treatment.  Rafael Bihari  FNP 1:37 PM

## 2020-12-04 ENCOUNTER — Encounter (HOSPITAL_COMMUNITY): Payer: Self-pay

## 2020-12-04 ENCOUNTER — Ambulatory Visit (HOSPITAL_COMMUNITY)
Admission: RE | Admit: 2020-12-04 | Discharge: 2020-12-04 | Disposition: A | Discharge status: Home / Self Care | Payer: Medicare Other | Source: Ambulatory Visit | Attending: Family Medicine | Admitting: Family Medicine

## 2020-12-04 ENCOUNTER — Other Ambulatory Visit: Payer: Self-pay

## 2020-12-04 VITALS — BP 100/68 | HR 84 | Wt 199.4 lb

## 2020-12-04 DIAGNOSIS — Z79899 Other long term (current) drug therapy: Secondary | ICD-10-CM | POA: Insufficient documentation

## 2020-12-04 DIAGNOSIS — C8316 Mantle cell lymphoma, intrapelvic lymph nodes: Secondary | ICD-10-CM | POA: Diagnosis not present

## 2020-12-04 DIAGNOSIS — I110 Hypertensive heart disease with heart failure: Secondary | ICD-10-CM | POA: Diagnosis not present

## 2020-12-04 DIAGNOSIS — I5022 Chronic systolic (congestive) heart failure: Secondary | ICD-10-CM | POA: Insufficient documentation

## 2020-12-04 DIAGNOSIS — Z7984 Long term (current) use of oral hypoglycemic drugs: Secondary | ICD-10-CM | POA: Insufficient documentation

## 2020-12-04 DIAGNOSIS — I509 Heart failure, unspecified: Secondary | ICD-10-CM | POA: Diagnosis present

## 2020-12-04 DIAGNOSIS — C8310 Mantle cell lymphoma, unspecified site: Secondary | ICD-10-CM | POA: Insufficient documentation

## 2020-12-04 DIAGNOSIS — I472 Ventricular tachycardia, unspecified: Secondary | ICD-10-CM | POA: Insufficient documentation

## 2020-12-04 NOTE — Patient Instructions (Signed)
Thank You for coming in today  Your physician recommends that you schedule a follow-up appointment in: 3 months with Dr. Haroldine Laws  At the [facility], you and your health needs are our priority. As part of our continuing mission to provide you with exceptional heart care, we have created designated Provider Care Teams. These Care Teams include your primary Cardiologist (physician) and Advanced Practice Providers (APPs- Physician Assistants and Nurse Practitioners) who all work together to provide you with the care you need, when you need it.   You may see any of the following providers on your designated Care Team at your next follow up: Dr Glori Bickers Dr Haynes Kerns, NP Lyda Jester, [place] [facility] [place], [place] Audry Riles, PharmD   Please be sure to bring in all your medications bottles to every appointment.    If you have any questions or concerns before your next appointment please send [place] a message through [place] or call our office at [phone].    TO LEAVE A MESSAGE FOR THE NURSE SELECT OPTION 2, PLEASE LEAVE A MESSAGE INCLUDING: YOUR NAME DATE OF BIRTH CALL BACK NUMBER REASON FOR CALL**this is important as we prioritize the call backs  YOU WILL RECEIVE A CALL BACK THE SAME DAY AS LONG AS YOU CALL BEFORE 4:00 PM

## 2020-12-05 ENCOUNTER — Encounter: Payer: Self-pay | Admitting: Internal Medicine

## 2020-12-05 ENCOUNTER — Inpatient Hospital Stay: Payer: Medicare Other | Attending: Physician Assistant

## 2020-12-05 ENCOUNTER — Inpatient Hospital Stay (HOSPITAL_BASED_OUTPATIENT_CLINIC_OR_DEPARTMENT_OTHER): Payer: Medicare Other | Admitting: Internal Medicine

## 2020-12-05 ENCOUNTER — Other Ambulatory Visit: Payer: Self-pay | Admitting: Gynecology

## 2020-12-05 VITALS — BP 78/59 | HR 88 | Temp 98.0°F | Resp 17 | Ht 62.0 in | Wt 199.7 lb

## 2020-12-05 DIAGNOSIS — E039 Hypothyroidism, unspecified: Secondary | ICD-10-CM | POA: Insufficient documentation

## 2020-12-05 DIAGNOSIS — E669 Obesity, unspecified: Secondary | ICD-10-CM | POA: Diagnosis not present

## 2020-12-05 DIAGNOSIS — C8313 Mantle cell lymphoma, intra-abdominal lymph nodes: Secondary | ICD-10-CM | POA: Diagnosis not present

## 2020-12-05 DIAGNOSIS — Z09 Encounter for follow-up examination after completed treatment for conditions other than malignant neoplasm: Secondary | ICD-10-CM

## 2020-12-05 DIAGNOSIS — C8318 Mantle cell lymphoma, lymph nodes of multiple sites: Secondary | ICD-10-CM | POA: Diagnosis present

## 2020-12-05 DIAGNOSIS — Z5111 Encounter for antineoplastic chemotherapy: Secondary | ICD-10-CM

## 2020-12-05 LAB — CBC WITH DIFFERENTIAL (CANCER CENTER ONLY)
Abs Immature Granulocytes: 0.04 10*3/uL (ref 0.00–0.07)
Basophils Absolute: 0 10*3/uL (ref 0.0–0.1)
Basophils Relative: 1 %
Eosinophils Absolute: 0.1 10*3/uL (ref 0.0–0.5)
Eosinophils Relative: 1 %
HCT: 40.4 % (ref 36.0–46.0)
Hemoglobin: 12.6 g/dL (ref 12.0–15.0)
Immature Granulocytes: 1 %
Lymphocytes Relative: 19 %
Lymphs Abs: 1.2 10*3/uL (ref 0.7–4.0)
MCH: 26.4 pg (ref 26.0–34.0)
MCHC: 31.2 g/dL (ref 30.0–36.0)
MCV: 84.5 fL (ref 80.0–100.0)
Monocytes Absolute: 0.6 10*3/uL (ref 0.1–1.0)
Monocytes Relative: 9 %
Neutro Abs: 4.4 10*3/uL (ref 1.7–7.7)
Neutrophils Relative %: 69 %
Platelet Count: 171 10*3/uL (ref 150–400)
RBC: 4.78 MIL/uL (ref 3.87–5.11)
RDW: 15.4 % (ref 11.5–15.5)
WBC Count: 6.3 10*3/uL (ref 4.0–10.5)
nRBC: 0 % (ref 0.0–0.2)

## 2020-12-05 LAB — CMP (CANCER CENTER ONLY)
ALT: 24 U/L (ref 0–44)
AST: 21 U/L (ref 15–41)
Albumin: 4 g/dL (ref 3.5–5.0)
Alkaline Phosphatase: 116 U/L (ref 38–126)
Anion gap: 7 (ref 5–15)
BUN: 26 mg/dL — ABNORMAL HIGH (ref 8–23)
CO2: 27 mmol/L (ref 22–32)
Calcium: 9 mg/dL (ref 8.9–10.3)
Chloride: 102 mmol/L (ref 98–111)
Creatinine: 1.36 mg/dL — ABNORMAL HIGH (ref 0.44–1.00)
GFR, Estimated: 43 mL/min — ABNORMAL LOW (ref >60)
Glucose, Bld: 106 mg/dL — ABNORMAL HIGH (ref 70–99)
Potassium: 3.8 mmol/L (ref 3.5–5.1)
Sodium: 136 mmol/L (ref 135–145)
Total Bilirubin: 0.7 mg/dL (ref 0.3–1.2)
Total Protein: 6.9 g/dL (ref 6.5–8.1)

## 2020-12-05 LAB — LACTATE DEHYDROGENASE: LDH: 121 U/L (ref 98–192)

## 2020-12-05 NOTE — Progress Notes (Signed)
[facility] [phone]   [phone]  OFFICE PROGRESS NOTE  [name], MD [address] [place] [address]  DIAGNOSIS: Stage IV non-Hodgkin's lymphoma, mantle cell lymphoma.  She presented with left iliac and retroperitoneal lymphadenopathy.  She also presented with left axillary lymphadenopathy and bone marrow involvement.  She was diagnosed in December 2020.   PRIOR THERAPY: Systemic chemotherapy with R-CHOP with Neulasta support.  First dose on 01/27/2019. Status post 6 cycles.    CURRENT THERAPY: Acalabrutinib 100 mg p.o. twice daily.  First dose started June 17, 2020.  Status post 2 months of treatment.  INTERVAL HISTORY: Natalie Baker 65 y.o. female returns to the clinic today for follow-up visit.  The patient is feeling fine today with no concerning complaints.  She denied having any significant chest pain, shortness of breath, cough or hemoptysis.  She denied having any fever or chills.  She has no nausea, vomiting, diarrhea or constipation.  She has no headache or visual changes.  She has been tolerating her treatment with acalabrutinib fairly well.  She is scheduled for CAR-T procedure in the next few weeks with a pheresis schedule on December 08, 2020. She discontinued her treatment with acalabrutinib last night.   MEDICAL HISTORY: Past Medical History:  Diagnosis Date   Allergy    Cancer ([place])    being worked up for lymphoma   CHF (congestive heart failure) ([place])    Colon polyp 12/29/2006   Dysrhythmia    Frozen shoulder 11/16/2015   Hyperlipidemia    Hypothyroidism    Obesity    BMI 34   Osteoporosis    Palpitations    Pre-diabetes    Vitamin D deficiency     ALLERGIES:  is allergic to other and ppd [tuberculin purified protein derivative].  MEDICATIONS:  Current Outpatient Medications  Medication Sig Dispense Refill   Acalabrutinib Maleate (CALQUENCE) 100 MG TABS Take 1 tablet by mouth in  the morning and at bedtime. 60 tablet 1   acetaminophen (TYLENOL) 500 MG tablet Take 500 mg by mouth every 6 (six) hours as needed for moderate pain.     carvedilol (COREG) 6.25 MG tablet TAKE 1 TABLET BY MOUTH TWICE A DAY 180 tablet 3   cetirizine (ZYRTEC) 10 MG tablet Take 5 mg by mouth daily as needed for allergies.     estradiol (VIVELLE-DOT) 0.05 MG/24HR patch Place 1 patch onto the skin 2 (two) times a week.     FARXIGA 10 MG TABS tablet TAKE 1 TABLET (10 MG TOTAL) BY MOUTH DAILY BEFORE BREAKFAST. 90 tablet 1   ibuprofen (ADVIL) 800 MG tablet Take 1 tablet (800 mg total) by mouth every 8 (eight) hours as needed. (Patient not taking: Reported on 12/04/2020) 30 tablet 0   levothyroxine (SYNTHROID) 100 MCG tablet Take  1 tablet  Daily  on an empty stomach with only water for 30 minutes & no Antacid meds, Calcium or Magnesium for 4 hours & avoid Biotin 90 tablet 0   losartan (COZAAR) 50 MG tablet Take 1 tablet (50 mg total) by mouth in the morning and at bedtime. 60 tablet 6   Potassium Chloride ER 20 MEQ TBCR Take 1 tablet by mouth as needed. Take with Torsemide 90 tablet 3   spironolactone (ALDACTONE) 25 MG tablet TAKE 1 TABLET BY MOUTH EVERY DAY 90 tablet 3   torsemide (DEMADEX) 20 MG tablet Take 10 mg by mouth 2 (two) times daily as needed (  swelling).     No current facility-administered medications for this visit.    SURGICAL HISTORY:  Past Surgical History:  Procedure Laterality Date   ABDOMINAL HYSTERECTOMY  1997   CESAREAN SECTION  1983   CHOLECYSTECTOMY N/A 09/25/2020   Procedure: LAPAROSCOPIC CHOLECYSTECTOMY;  Surgeon: Kinsinger, Arta Bruce, MD;  Location: [facility];  Service: General;  Laterality: N/A;  60 MINUTES   COLONOSCOPY W/ BIOPSIES  12/29/2006   IR IMAGING GUIDED PORT INSERTION  01/15/2019   LAPAROSCOPY     1991 for emdometriosis   RIGHT/LEFT HEART CATH AND CORONARY ANGIOGRAPHY N/A 06/25/2019   Procedure: RIGHT/LEFT HEART CATH AND CORONARY ANGIOGRAPHY;  Surgeon: Jolaine Artist, MD;  Location: [facility];  Service: Cardiovascular;  Laterality: N/A;   UPPER GASTROINTESTINAL ENDOSCOPY      REVIEW OF SYSTEMS:  A comprehensive review of systems was negative except for: Constitutional: positive for fatigue   PHYSICAL EXAMINATION: General appearance: alert, cooperative, and no distress Head: Normocephalic, without obvious abnormality, atraumatic Neck: no adenopathy, no JVD, supple, symmetrical, trachea midline, and thyroid not enlarged, symmetric, no tenderness/mass/nodules Lymph nodes: Cervical, supraclavicular, and axillary nodes normal. Resp: clear to auscultation bilaterally Back: symmetric, no curvature. ROM normal. No CVA tenderness. Cardio: regular rate and rhythm, S1, S2 normal, no murmur, click, rub or gallop GI: soft, non-tender; bowel sounds normal; no masses,  no organomegaly Extremities: extremities normal, atraumatic, no cyanosis or edema  ECOG PERFORMANCE STATUS: 1 - Symptomatic but completely ambulatory  Blood pressure (!) 78/59, pulse 88, temperature 98 F (36.7 C), temperature source Axillary, resp. rate 17, height 5\' 2"  (1.575 m), weight 199 lb 11.2 oz (90.6 kg), SpO2 98 %.  LABORATORY DATA: Lab Results  Component Value Date   WBC 6.3 12/05/2020   HGB 12.6 12/05/2020   HCT 40.4 12/05/2020   MCV 84.5 12/05/2020   PLT 171 12/05/2020      Chemistry      Component Value Date/Time   NA 137 10/26/2020 1515   K 4.5 10/26/2020 1515   CL 102 10/26/2020 1515   CO2 26 10/26/2020 1515   BUN 18 10/26/2020 1515   CREATININE 1.08 (H) 10/26/2020 1515   CREATININE 1.02 (H) 09/29/2020 1016   CREATININE 0.74 12/21/2018 1513      Component Value Date/Time   CALCIUM 9.6 10/26/2020 1515   ALKPHOS 104 09/29/2020 1016   AST 19 09/29/2020 1016   ALT 24 09/29/2020 1016   BILITOT 0.4 09/29/2020 1016       RADIOGRAPHIC STUDIES: MR CARDIAC MORPHOLOGY W WO CONTRAST  Result Date: 11/18/2020 CLINICAL DATA:  Cardiomyopathy,?LV thrombus  EXAM: CARDIAC MRI TECHNIQUE: The patient was scanned on a 1.5 Tesla GE magnet. A dedicated cardiac coil was used. Functional imaging was done using Fiesta sequences. 2,3, and 4 chamber views were done to assess for RWMA's. Modified Simpson's rule using a short axis stack was used to calculate an ejection fraction on a dedicated work Conservation officer, nature. The patient received 8 cc of Gadavist. After 10 minutes inversion recovery sequences were used to assess for infiltration and scar tissue. FINDINGS: Limited images of the lung fields showed no gross abnormalities. Normal left ventricular size and wall thickness. There was trabeculation towards the apex but no thrombus noted. Global hypokinesis, EF 39%. Normal right ventricular size and systolic function, EF 70%. Mild left atrial enlargement. Normal right atrium. Mitral regurgitation looks mild. Aortic valve not well-visualized but appears trileaflet with no significant stenosis or regurgitation. Technically poor delayed enhancement  images. I did not see any definite late gadolinium enhancement (LGE). MEASUREMENTS: MEASUREMENTS LVEDV 139 mL LVSV 54 mL LVEF 39% RVEDV 106 mL RVSV 50 mL RVEF 47% T1 indices not reported as images were difficult. IMPRESSION: 1.  Technically poor images with significant artifact. 2.  Normal LV size with EF 39%, global hypokinesis.  No LV thrombus. 3.  Normal RV size and systolic function. 4. Difficult images, but I do not see definite LGE. Therefore, no definite evidence for prior MI, infiltrative disease, or myocarditis. Dalton Mclean Electronically Signed   By: Loralie Champagne M.D.   On: 11/18/2020 09:45     ASSESSMENT AND PLAN: This is a very pleasant 65 years old white female recently diagnosed with stage IV less aggressive mantle cell lymphoma presented with left iliac and retroperitoneal lymphadenopathy as well as left axillary and bone marrow involvement in December 2020. The patient completed 6 cycles of systemic  chemotherapy with R-CHOP.  She tolerated her treatment fairly well with no concerning adverse effects but few months later she developed congestive heart failure with drop in her ejection fraction to the 20-25%.  This has improved over the last few months and currently around 55%.  She is followed by Dr. Haroldine Laws. Unfortunately repeat CT scan in June 2022 showed evidence for disease progression with mild increase in the small bilateral axillary lymph nodes and moderate increase in the left supraclavicular as well as ileocolic mesenteric lymph nodes and left external iliac lymph nodes but mild increase in the periaortic retroperitoneal lymph nodes. The patient is started treatment with acalabrutinib 100 mg p.o. twice daily on June 17, 2020  She was seen by Dr. Laverle Hobby at [facility] on July 12, 2020 for discussion of potential autologous stem cell transplant.   The patient had repeat CT scan of the chest, abdomen pelvis performed recently that showed decrease in the size of mesenteric lymph node/mass of the right hemiabdomen, left external iliac lymph nodes and lymph nodes adjacent to the pancreatic tail/splenic hilum but there was mild increase in the size of the mediastinal paraesophageal nodes and retroperitoneal periaortic lymph node as well as increase in the size of the spleen. She was seen by the transplant team at [facility] and she is currently being considered for CAR-T procedure very soon.  She is a scheduled for the a pheresis on Friday, December 08, 2020. I recommended for the patient to proceed with the treatment as planned.  She stopped her treatment with acalabrutinib last night.  I will see her for follow-up visit after the CAR-T for any additional treatment or monitoring in [place]. She continues to have hypotension but this is secondary to her cardiac medication and she is followed by Dr. Haroldine Laws who is very familiar with her current blood pressure situation. The  patient voices understanding of current disease status and treatment options and is in agreement with the current care plan.  All questions were answered. The patient knows to call the clinic with any problems, questions or concerns. We can certainly see the patient much sooner if necessary.  Disclaimer: This note was dictated with voice recognition software. Similar sounding words can inadvertently be transcribed and may not be corrected upon review.

## 2020-12-05 NOTE — Addendum Note (Signed)
Addended by: Abelina Bachelor H on: 12/05/2020 01:00 PM   Modules accepted: Orders

## 2020-12-08 DIAGNOSIS — E538 Deficiency of other specified B group vitamins: Secondary | ICD-10-CM | POA: Insufficient documentation

## 2020-12-08 NOTE — Progress Notes (Signed)
Patient ID: Natalie GOSSER, female   DOB: Natalie 13, 1957, 65 y.o.   MRN: [id number]  Complete Physical  Assessment and Plan:  Encounter for Annual Physical Exam with abnormal findings Due annually  Health Maintenance reviewed Healthy lifestyle reviewed and goals set  Atherosclerosis of aorta (HCC) Control blood pressure, cholesterol, glucose, increase exercise.   Systolic CHF ([place]) appears euvolemic,  Dr. Missy Sabins following closely Emphasized salt restriction, less than 2000mg  a day. Encouraged daily monitoring of the patient's weight, call office if 5 lb weight loss or gain in a day.  Encouraged regular exercise. If any increasing shortness of breath, swelling, or chest pressure go to ER immediately.  decrease your fluid intake to less than 2 L daily please remember to always increase your potassium intake with any increase of your fluid pill.   Other specified hypothyroidism Hypothyroidism-check TSH level, continue medications the same, reminded to take on an empty stomach 30-49mins before food.  - TSH  Palpitations -currently well controlled, continue to avoid triggers - EKG 12-Lead  Hyperlipidemia -continue supplements, check lipids, decrease fatty foods, increase activity.  - if LDL remains above 130 initiate statin discussed - will also coordinate with onc Dr. Julien Nordmann to make sure they are on board.  - Lipid panel   Vitamin D deficiency - defer check as never changed dose, she will increase to 10000 IU daily, check next OV  Osteoporosis -Vit D and calcium recommended, vivielle dot - last DEXA 2018-2019, getting DEXA via GYN  Morbid Obesity - BMI 36 with htn, hld, prediabetes, hld -diet and exercise discussed; add regular exercise in small increments - weight loss encouraged   Medication management - CBC with Differential/Platelet - CMP/GFR - Magnesium  Prediabetes/family history diabetes - Defer Hemoglobin A1c as just had checked  - weight loss discussed,  goal for <190 lb set for next visit   Hypertension - Urinalysis, Routine w reflex microscopic (not at [facility]) - Microalbumin / creatinine urine ratio - EKG 12-Lead  Mantle Cell Lymphoma ([place]) Dr. Earlie Server following, chemotherapy, starting CAR-T  B12 def - B12  Discussed med's effects and SE's. Screening labs and tests as requested with regular follow-up as recommended. Future Appointments  Date Time Provider [place]  12/27/2020  1:20 PM [facility] DIAG [place]OM[place] 1 [facility]  12/27/2020  1:30 PM [facility] [place] 1 [facility]  03/07/2021  3:00 PM Bensimhon, Shaune Pascal, MD [facility] None  12/12/2021  9:00 AM Liane Comber, NP [facility] None    HPI  65 y.o. female  presents for a complete physical. She has Hyperlipidemia; Vitamin D deficiency; Hypothyroidism; Morbid obesity ([place]) BMI 35+ with prediabetes, hyperlipidemia, htn; Osteoporosis; Prediabetes; Labile hypertension; Aortic atherosclerosis ([place]); Mantle cell lymphoma ([place]); Goals of care, counseling/discussion; Encounter for antineoplastic chemotherapy; Port-A-Cath in place; Tachycardia; Systolic CHF ([place]); Acute cholecystitis; Gallstones; and B12 deficiency on their problem list.  She is married, 1 child, no grandchildren. 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 Husband is retired.   She just had lab chole 09/25/2020 by Dr. Kieth Brightly, doing well, some loose stools but manageable.   She is followed by GYN Dr. Selinda Flavin; hx of TAH for endometriosis; She has osteoporosis and is on vivelle dot. Just saw last week. Having a mammogram at breast center due to mild abnormality last year.   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 Earlie Server, undergoing chemotherapy by port and getting regular labs through cancer center. Discussing CAR-T with [facility] starting next  month.   BMI is Body mass index is 36.65 kg/m., she has been working on diet, cut out red meat, pushing fruits  and veggies and admits minimal exercise due to fatigue/treatments, but needs to start walking. Also has stationary cycle. She does have walking paths near her home.  Wt Readings from Last 3 Encounters:  12/12/20 200 lb 6.4 oz (90.9 kg)  12/05/20 199 lb 11.2 oz (90.6 kg)  12/04/20 199 lb 6.4 oz (77.1 kg)   Systolic CHF following chemotherapy, Dr. Missy Sabins follows, on low dose coreg, losartan, spironolactone and PRN torsemide. Chronic exertional dyspnea unchanged. Recent ECHO 05/04/2020 with EF 50-55%.   Her blood pressure has been controlled at home, today their BP is BP: 106/72.   She does not workout. She denies chest pain, dizziness.   She has aortic atherosclerosis per CT 12/2018.   She is not on cholesterol medication (has been off due to ongoing mantle cell lymphoma treatments) and denies myalgias. Her cholesterol is not at goal. The cholesterol last visit was:  Lab Results  Component Value Date   CHOL 214 (H) 06/09/2020   HDL 52 06/09/2020   LDLCALC 140 (H) 06/09/2020   TRIG 108 06/09/2020   CHOLHDL 4.1 06/09/2020  . She has been working on diet and exercise for hx of prediabetes,  and denies foot ulcerations, hyperglycemia, hypoglycemia , increased appetite, nausea, paresthesia of the feet, polydipsia, polyuria, visual disturbances, vomiting and weight loss. Last A1C in the office was:  Lab Results  Component Value Date   HGBA1C 5.3 09/21/2020   She has CKD III, monitored closely by cardiology and onc.  Lab Results  Component Value Date   GFRNONAA 36 (L) 12/05/2020   Patient is on Vitamin D supplement, 5000 IU daily, hasn't changed since last, agreeable to increasing dose Lab Results  Component Value Date   VD25OH 28 (L) 06/09/2020     She is on thyroid medication. Her medication was not changed last visit.   Lab Results  Component Value Date   TSH 2.09 06/09/2020    Lab Results  Component Value Date   [id number] 383 07/29/2016      Current Medications:   Current Outpatient Medications on File Prior to Visit  Medication Sig Dispense Refill   Acalabrutinib Maleate (CALQUENCE) 100 MG TABS Take 1 tablet by mouth in the morning and at bedtime. 60 tablet 1   acetaminophen (TYLENOL) 500 MG tablet Take 500 mg by mouth every 6 (six) hours as needed for moderate pain.     cetirizine (ZYRTEC) 10 MG tablet Take 5 mg by mouth daily as needed for allergies.     estradiol (VIVELLE-DOT) 0.05 MG/24HR patch Place 1 patch onto the skin 2 (two) times a week.     FARXIGA 10 MG TABS tablet TAKE 1 TABLET (10 MG TOTAL) BY MOUTH DAILY BEFORE BREAKFAST. 90 tablet 1   ibuprofen (ADVIL) 800 MG tablet Take 1 tablet (800 mg total) by mouth every 8 (eight) hours as needed. 30 tablet 0   levothyroxine (SYNTHROID) 100 MCG tablet Take  1 tablet  Daily  on an empty stomach with only water for 30 minutes & no Antacid meds, Calcium or Magnesium for 4 hours & avoid Biotin 90 tablet 0   losartan (COZAAR) 50 MG tablet Take 1 tablet (50 mg total) by mouth in the morning and at bedtime. 60 tablet 6   Potassium Chloride ER 20 MEQ TBCR Take 1 tablet by mouth as needed. Take with Torsemide 90 tablet  3   spironolactone (ALDACTONE) 25 MG tablet TAKE 1 TABLET BY MOUTH EVERY DAY 90 tablet 3   torsemide (DEMADEX) 20 MG tablet Take 10 mg by mouth 2 (two) times daily as needed (swelling).     No current facility-administered medications on file prior to visit.    Health Maintenance:   Immunization History  Administered Date(s) Administered   Influenza Inj Mdck Quad Pf 10/04/2017   Influenza Split 11/06/2010   Influenza, Seasonal, Injecte, Preservative Fre 01/29/2016   Influenza,inj,Quad PF,6+ Mos 01/27/2019   Tdap 07/29/2016   Tetanus: 2018  Influenza 2021, TODAY Prevnar 20 - TODAY  Pneumonia due 1 year Shingles declines Covid 19: has had, req dates  Pap: getting pelvics only, goes to GYN, pelvic, but has had hysterectomy MGM: 2021, at GYN has upcoming scheduled with breast center   DEXA: 2014, had 2018-2019, GYN monitoring Colonoscopy: 03/2018 due 2030 [place] AB 2013  Last Dental Exam: Dr. Laurena Bering, 2022 Last Eye Exam: Dr. Delman Cheadle, 2022 Last derm: Dr. Allyson Sabal, last ? 2020 ? Has been since then   Patient Care Team: [name], MD as PCP - General (Internal Medicine) Elouise Munroe, MD as PCP - Cardiology (Cardiology) Gatha Mayer, MD as Consulting Physician (Gastroenterology) Paula Compton, MD as Consulting Physician (Obstetrics and Gynecology) Druscilla Brownie, MD as Consulting Physician (Dermatology)   Medical History:  Past Medical History:  Diagnosis Date   Allergy    Cancer [facility])    being worked up for lymphoma   CHF (congestive heart failure) ([place])    Colon polyp 12/29/2006   Dysrhythmia    Frozen shoulder 11/16/2015   Hyperlipidemia    Hypothyroidism    Obesity    BMI 34   Osteoporosis    Palpitations    Pre-diabetes    Vitamin D deficiency    Allergies Allergies  Allergen Reactions   Other     Chemo from Jan - May 2021 caused acute heart failure    Ppd [Tuberculin Purified Protein Derivative] Swelling    Positive reaction.  Negative Chest Xray 06/16/12    SURGICAL HISTORY She  has a past surgical history that includes Abdominal hysterectomy (1997); Cesarean section (1983); Colonoscopy w/ biopsies (12/29/2006); laparoscopy; Upper gastrointestinal endoscopy; IR IMAGING GUIDED PORT INSERTION (01/15/2019); RIGHT/LEFT HEART CATH AND CORONARY ANGIOGRAPHY (N/A, 06/25/2019); and Cholecystectomy (N/A, 09/25/2020). FAMILY HISTORY Her family history includes Alcohol abuse in her father; Bone cancer in her maternal aunt; Breast cancer in her maternal aunt; Breast cancer (age of onset: 38) in her niece; Breast cancer (age of onset: 90) in her sister; Cancer (age of onset: 4) in her mother; Diabetes in her brother, mother, paternal grandmother, and sister; Hashimoto's thyroiditis in her sister; Heart disease in her father and maternal aunt; Heart  disease (age of onset: 28) in her mother; Hyperlipidemia in her brother and maternal aunt; Hypertension in her brother, father, and mother; Hypothyroidism in her brother and sister; Liver disease in her maternal grandmother and mother; Prostate cancer (age of onset: 70) in her brother; Thyroid disease in her mother. SOCIAL HISTORY She  reports that she quit smoking about 24 years ago. Her smoking use included cigarettes. She has never used smokeless tobacco. She reports that she does not drink alcohol and does not use drugs.  Review of Systems: Review of Systems  Constitutional:  Negative for chills, fever, malaise/fatigue and weight loss.  HENT:  Negative for congestion, ear pain, hearing loss, sore throat and tinnitus.   Eyes: Negative.  Negative for  blurred vision and double vision.  Respiratory:  Negative for cough, sputum production, shortness of breath and wheezing.   Cardiovascular:  Negative for chest pain, palpitations, orthopnea, claudication, leg swelling and PND.  Gastrointestinal:  Negative for abdominal pain, blood in stool, constipation, diarrhea, heartburn, melena, nausea and vomiting.  Genitourinary: Negative.   Musculoskeletal:  Negative for falls, joint pain, myalgias and neck pain.  Skin:  Negative for itching and rash.  Neurological:  Negative for dizziness, tingling, sensory change, loss of consciousness, weakness and headaches.  Endo/Heme/Allergies:  Negative for polydipsia.  Psychiatric/Behavioral: Negative.  Negative for depression, memory loss, substance abuse and suicidal ideas. The patient is not nervous/anxious and does not have insomnia.   All other systems reviewed and are negative.  Physical Exam: Estimated body mass index is 36.65 kg/m as calculated from the following:   Height as of this encounter: 5\' 2"  (1.575 m).   Weight as of this encounter: 200 lb 6.4 oz (90.9 kg). BP 106/72   Pulse (!) 106   Temp (!) 96.6 F (35.9 C)   Ht 5\' 2"  (1.575 m)   Wt 200  lb 6.4 oz (90.9 kg)   SpO2 99%   BMI 36.65 kg/m   General Appearance: Well nourished well developed, in no apparent distress.  Eyes: PERRLA, EOMs, conjunctiva no swelling or erythema ENT/Mouth: Ear canals normal without obstruction, swelling, erythema, or discharge.  TMs normal bilaterally with no erythema, bulging, retraction, or loss of landmark.  Oropharynx moist and clear with no exudate, erythema, or swelling.   Neck: Supple, thyroid normal. No bruits.  No cervical adenopathy Respiratory: Respiratory effort normal, Breath sounds clear A&P without wheeze, rhonchi, rales.   Cardio: RRR without murmurs, rubs or gallops, occ extra beat. Brisk peripheral pulses without edema.  Chest: symmetric, with normal excursions Breasts: defer to GYN, getting mammograms Abdomen: Morbidly obese, Soft, nontender, no guarding, rebound, hernias, masses, or organomegaly.  Lymphatics: Non tender without lymphadenopathy.  Musculoskeletal: Full ROM all peripheral extremities,5/5 strength, and normal gait.  Skin: Warm, dry without rashes, lesions, ecchymosis. Neuro: Awake and oriented X 3, Cranial nerves intact, reflexes equal bilaterally. Normal muscle tone, no cerebellar symptoms. Sensation intact.  Psych:  normal affect, Insight and Judgment appropriate.  GU: defer to GYN  EKG: defer - just had 12/04/2020, reviewed  Over 40 minutes of exam, counseling, chart review and critical decision making was performed  Natalie Baker 9:24 AM [facility]

## 2020-12-11 ENCOUNTER — Encounter: Payer: Self-pay | Admitting: Internal Medicine

## 2020-12-12 ENCOUNTER — Encounter: Payer: Self-pay | Admitting: Adult Health

## 2020-12-12 ENCOUNTER — Ambulatory Visit (INDEPENDENT_AMBULATORY_CARE_PROVIDER_SITE_OTHER): Payer: Medicare Other | Admitting: Adult Health

## 2020-12-12 ENCOUNTER — Encounter: Payer: Self-pay | Admitting: Internal Medicine

## 2020-12-12 ENCOUNTER — Other Ambulatory Visit: Payer: Self-pay

## 2020-12-12 VITALS — BP 106/72 | HR 106 | Temp 96.6°F | Ht 62.0 in | Wt 200.4 lb

## 2020-12-12 DIAGNOSIS — R0989 Other specified symptoms and signs involving the circulatory and respiratory systems: Secondary | ICD-10-CM

## 2020-12-12 DIAGNOSIS — M810 Age-related osteoporosis without current pathological fracture: Secondary | ICD-10-CM

## 2020-12-12 DIAGNOSIS — E785 Hyperlipidemia, unspecified: Secondary | ICD-10-CM

## 2020-12-12 DIAGNOSIS — I10 Essential (primary) hypertension: Secondary | ICD-10-CM | POA: Diagnosis not present

## 2020-12-12 DIAGNOSIS — C8313 Mantle cell lymphoma, intra-abdominal lymph nodes: Secondary | ICD-10-CM

## 2020-12-12 DIAGNOSIS — I5022 Chronic systolic (congestive) heart failure: Secondary | ICD-10-CM

## 2020-12-12 DIAGNOSIS — E538 Deficiency of other specified B group vitamins: Secondary | ICD-10-CM

## 2020-12-12 DIAGNOSIS — R6889 Other general symptoms and signs: Secondary | ICD-10-CM

## 2020-12-12 DIAGNOSIS — Z0001 Encounter for general adult medical examination with abnormal findings: Secondary | ICD-10-CM | POA: Diagnosis not present

## 2020-12-12 DIAGNOSIS — E559 Vitamin D deficiency, unspecified: Secondary | ICD-10-CM

## 2020-12-12 DIAGNOSIS — Z0000 Encounter for general adult medical examination without abnormal findings: Secondary | ICD-10-CM | POA: Diagnosis not present

## 2020-12-12 DIAGNOSIS — I700 Atherosclerosis of aorta: Secondary | ICD-10-CM

## 2020-12-12 DIAGNOSIS — Z23 Encounter for immunization: Secondary | ICD-10-CM

## 2020-12-12 DIAGNOSIS — R7303 Prediabetes: Secondary | ICD-10-CM

## 2020-12-12 DIAGNOSIS — E782 Mixed hyperlipidemia: Secondary | ICD-10-CM

## 2020-12-12 DIAGNOSIS — E039 Hypothyroidism, unspecified: Secondary | ICD-10-CM

## 2020-12-12 NOTE — Patient Instructions (Addendum)
  Natalie Baker , Thank you for taking time to come for your AnnualWellness Visit. I appreciate your ongoing commitment to your health goals. Please review the following plan we discussed and let me know if I can assist you in the future.   These are the goals we discussed:  Goals       Blood Pressure < 130/80      DIET - INCREASE WATER INTAKE      65-80+ fluid ounces daily       Exercise 150 min/wk Moderate Activity      LDL CALC < 100      Weight (lb) < 175 lb (79.4 kg) (pt-stated)        This is a list of the screening recommended for you and due dates:  Health Maintenance  Topic Date Due   COVID-19 Vaccine (1) Never done   Pneumonia Vaccine (1 - PCV) Never done   Mammogram  01/16/2017   Flu Shot  08/07/2020   Zoster (Shingles) Vaccine (1 of 2) 03/12/2021*   Tetanus Vaccine  07/30/2026   Colon Cancer Screening  03/19/2028   DEXA scan (bone density measurement)  Completed   Hepatitis C Screening: USPSTF Recommendation to screen - Ages 76-79 yo.  Completed   HIV Screening  Completed   HPV Vaccine  Aged Out  *Topic was postponed. The date shown is not the original due date.       Ask oncology if they are ok with cholesterol treatments -   Please send covid 19 vaccine dates/information      Know what a healthy weight is for you (roughly BMI <25) and aim to maintain this  Aim for 7+ servings of fruits and vegetables daily  65-80+ fluid ounces of water or unsweet tea for healthy kidneys  Limit to max 1 drink of alcohol per day; avoid smoking/tobacco  Limit animal fats in diet for cholesterol and heart health - choose grass fed whenever available  Avoid highly processed foods, and foods high in saturated/trans fats  Aim for low stress - take time to unwind and care for your mental health  Aim for 150 min of moderate intensity exercise weekly for heart health, and weights twice weekly for bone health  Aim for 7-9 hours of sleep daily     A great goal to work  towards is aiming to get in a serving daily of some of the most nutritionally dense foods - G- BOMBS daily

## 2020-12-15 ENCOUNTER — Encounter: Payer: Self-pay | Admitting: Internal Medicine

## 2020-12-15 LAB — VITAMIN D 25 HYDROXY (VIT D DEFICIENCY, FRACTURES): Vit D, 25-Hydroxy: 36 ng/mL (ref 30–100)

## 2020-12-15 LAB — LIPID PANEL
Cholesterol: 202 mg/dL — ABNORMAL HIGH (ref <200)
HDL: 32 mg/dL — ABNORMAL LOW (ref >=50)
LDL Cholesterol (Calc): 144 mg/dL (calc) — ABNORMAL HIGH
Non-HDL Cholesterol (Calc): 170 mg/dL (calc) — ABNORMAL HIGH (ref <130)
Total CHOL/HDL Ratio: 6.3 (calc) — ABNORMAL HIGH (ref <5.0)
Triglycerides: 133 mg/dL (ref <150)

## 2020-12-15 LAB — COMPLETE METABOLIC PANEL WITH GFR
AG Ratio: 1.9 (calc) (ref 1.0–2.5)
ALT: 18 U/L (ref 6–29)
AST: 18 U/L (ref 10–35)
Albumin: 4.1 g/dL (ref 3.6–5.1)
Alkaline phosphatase (APISO): 114 U/L (ref 37–153)
BUN: 16 mg/dL (ref 7–25)
CO2: 31 mmol/L (ref 20–32)
Calcium: 9.8 mg/dL (ref 8.6–10.4)
Chloride: 103 mmol/L (ref 98–110)
Creat: 1.03 mg/dL (ref 0.50–1.05)
Globulin: 2.2 g/dL (calc) (ref 1.9–3.7)
Glucose, Bld: 86 mg/dL (ref 65–99)
Potassium: 4.9 mmol/L (ref 3.5–5.3)
Sodium: 142 mmol/L (ref 135–146)
Total Bilirubin: 0.4 mg/dL (ref 0.2–1.2)
Total Protein: 6.3 g/dL (ref 6.1–8.1)
eGFR: 60 mL/min/{1.73_m2} (ref >=60)

## 2020-12-15 LAB — CBC WITH DIFFERENTIAL/PLATELET
Absolute Monocytes: 487 cells/uL (ref 200–950)
Basophils Absolute: 50 cells/uL (ref 0–200)
Basophils Relative: 0.9 %
Eosinophils Absolute: 50 cells/uL (ref 15–500)
Eosinophils Relative: 0.9 %
HCT: 41.7 % (ref 35.0–45.0)
Hemoglobin: 12.9 g/dL (ref 11.7–15.5)
Lymphs Abs: 1092 cells/uL (ref 850–3900)
MCH: 26.2 pg — ABNORMAL LOW (ref 27.0–33.0)
MCHC: 30.9 g/dL — ABNORMAL LOW (ref 32.0–36.0)
MCV: 84.6 fL (ref 80.0–100.0)
MPV: 10.9 fL (ref 7.5–12.5)
Monocytes Relative: 8.7 %
Neutro Abs: 3920 cells/uL (ref 1500–7800)
Neutrophils Relative %: 70 %
Platelets: 148 10*3/uL (ref 140–400)
RBC: 4.93 10*6/uL (ref 3.80–5.10)
RDW: 14.5 % (ref 11.0–15.0)
Total Lymphocyte: 19.5 %
WBC: 5.6 10*3/uL (ref 3.8–10.8)

## 2020-12-15 LAB — URINALYSIS, ROUTINE W REFLEX MICROSCOPIC
Bilirubin Urine: NEGATIVE
Hgb urine dipstick: NEGATIVE
Ketones, ur: NEGATIVE
Leukocytes,Ua: NEGATIVE
Nitrite: NEGATIVE
Protein, ur: NEGATIVE
Specific Gravity, Urine: 1.015 (ref 1.001–1.035)
pH: 6.5 (ref 5.0–8.0)

## 2020-12-15 LAB — MICROALBUMIN / CREATININE URINE RATIO
Creatinine, Urine: 135 mg/dL (ref 20–275)
Microalb Creat Ratio: 16 mcg/mg creat (ref <30)
Microalb, Ur: 2.2 mg/dL

## 2020-12-15 LAB — VITAMIN B12: Vitamin B-12: 445 pg/mL (ref 200–1100)

## 2020-12-15 LAB — MAGNESIUM: Magnesium: 1.9 mg/dL (ref 1.5–2.5)

## 2020-12-15 LAB — TSH: TSH: 1.76 mIU/L (ref 0.40–4.50)

## 2020-12-20 ENCOUNTER — Encounter: Payer: Self-pay | Admitting: Internal Medicine

## 2020-12-22 ENCOUNTER — Other Ambulatory Visit: Payer: Self-pay | Admitting: Internal Medicine

## 2020-12-22 ENCOUNTER — Other Ambulatory Visit (HOSPITAL_COMMUNITY): Payer: Self-pay

## 2020-12-22 DIAGNOSIS — C8313 Mantle cell lymphoma, intra-abdominal lymph nodes: Secondary | ICD-10-CM

## 2020-12-25 ENCOUNTER — Other Ambulatory Visit (HOSPITAL_COMMUNITY): Payer: Self-pay

## 2020-12-25 ENCOUNTER — Other Ambulatory Visit: Payer: Self-pay | Admitting: Internal Medicine

## 2020-12-25 DIAGNOSIS — C8313 Mantle cell lymphoma, intra-abdominal lymph nodes: Secondary | ICD-10-CM

## 2020-12-26 ENCOUNTER — Other Ambulatory Visit: Payer: Self-pay | Admitting: Internal Medicine

## 2020-12-26 ENCOUNTER — Telehealth: Payer: Self-pay

## 2020-12-26 ENCOUNTER — Other Ambulatory Visit (HOSPITAL_COMMUNITY): Payer: Self-pay

## 2020-12-26 DIAGNOSIS — C8313 Mantle cell lymphoma, intra-abdominal lymph nodes: Secondary | ICD-10-CM

## 2020-12-26 MED ORDER — CALQUENCE 100 MG PO TABS
1.0000 | ORAL_TABLET | Freq: Two times a day (BID) | ORAL | 1 refills | Status: DC
  Filled 2020-12-26: qty 60, 30d supply, fill #0

## 2020-12-26 NOTE — Telephone Encounter (Signed)
Pt request refill of Calquence. She states the dates for her transplant has changed and currently is scheduled for the end of January if all goes well.

## 2020-12-27 ENCOUNTER — Ambulatory Visit
Admission: RE | Admit: 2020-12-27 | Discharge: 2020-12-27 | Disposition: A | Discharge status: Home / Self Care | Payer: Medicare Other | Source: Ambulatory Visit | Attending: Gynecology | Admitting: Gynecology

## 2020-12-27 ENCOUNTER — Ambulatory Visit: Admission: RE | Admit: 2020-12-27 | Payer: Medicare Other | Source: Ambulatory Visit

## 2020-12-27 DIAGNOSIS — Z09 Encounter for follow-up examination after completed treatment for conditions other than malignant neoplasm: Secondary | ICD-10-CM

## 2021-01-19 ENCOUNTER — Other Ambulatory Visit (HOSPITAL_COMMUNITY): Payer: Self-pay

## 2021-01-25 ENCOUNTER — Other Ambulatory Visit: Payer: Self-pay | Admitting: Internal Medicine

## 2021-01-25 DIAGNOSIS — E039 Hypothyroidism, unspecified: Secondary | ICD-10-CM

## 2021-01-29 ENCOUNTER — Other Ambulatory Visit (HOSPITAL_COMMUNITY): Payer: Self-pay

## 2021-01-30 ENCOUNTER — Other Ambulatory Visit (HOSPITAL_COMMUNITY): Payer: Self-pay

## 2021-01-31 ENCOUNTER — Other Ambulatory Visit (HOSPITAL_COMMUNITY): Payer: Self-pay

## 2021-02-01 ENCOUNTER — Other Ambulatory Visit (HOSPITAL_COMMUNITY): Payer: Self-pay

## 2021-02-21 ENCOUNTER — Other Ambulatory Visit (HOSPITAL_COMMUNITY): Payer: Self-pay

## 2021-02-26 ENCOUNTER — Other Ambulatory Visit (HOSPITAL_COMMUNITY): Payer: Self-pay

## 2021-03-01 ENCOUNTER — Other Ambulatory Visit (HOSPITAL_COMMUNITY): Payer: Self-pay

## 2021-03-01 ENCOUNTER — Encounter: Payer: Self-pay | Admitting: Internal Medicine

## 2021-03-01 ENCOUNTER — Inpatient Hospital Stay: Payer: Medicare Other | Attending: Physician Assistant | Admitting: Internal Medicine

## 2021-03-01 ENCOUNTER — Inpatient Hospital Stay: Payer: Medicare Other

## 2021-03-01 ENCOUNTER — Other Ambulatory Visit: Payer: Self-pay

## 2021-03-01 VITALS — BP 101/79 | HR 123 | Temp 96.5°F | Resp 21 | Ht 62.0 in | Wt 190.1 lb

## 2021-03-01 DIAGNOSIS — C8318 Mantle cell lymphoma, lymph nodes of multiple sites: Secondary | ICD-10-CM | POA: Diagnosis not present

## 2021-03-01 DIAGNOSIS — C8313 Mantle cell lymphoma, intra-abdominal lymph nodes: Secondary | ICD-10-CM

## 2021-03-01 LAB — CBC WITH DIFFERENTIAL (CANCER CENTER ONLY)
Abs Immature Granulocytes: 0.02 10*3/uL (ref 0.00–0.07)
Basophils Absolute: 0.1 10*3/uL (ref 0.0–0.1)
Basophils Relative: 2 %
Eosinophils Absolute: 0 10*3/uL (ref 0.0–0.5)
Eosinophils Relative: 0 %
HCT: 41 % (ref 36.0–46.0)
Hemoglobin: 13.4 g/dL (ref 12.0–15.0)
Immature Granulocytes: 0 %
Lymphocytes Relative: 7 %
Lymphs Abs: 0.3 10*3/uL — ABNORMAL LOW (ref 0.7–4.0)
MCH: 30.5 pg (ref 26.0–34.0)
MCHC: 32.7 g/dL (ref 30.0–36.0)
MCV: 93.2 fL (ref 80.0–100.0)
Monocytes Absolute: 0.8 10*3/uL (ref 0.1–1.0)
Monocytes Relative: 16 %
Neutro Abs: 3.8 10*3/uL (ref 1.7–7.7)
Neutrophils Relative %: 75 %
Platelet Count: 188 10*3/uL (ref 150–400)
RBC: 4.4 MIL/uL (ref 3.87–5.11)
RDW: 22.1 % — ABNORMAL HIGH (ref 11.5–15.5)
WBC Count: 5 10*3/uL (ref 4.0–10.5)
nRBC: 2.8 % — ABNORMAL HIGH (ref 0.0–0.2)

## 2021-03-01 LAB — CMP (CANCER CENTER ONLY)
ALT: 161 U/L — ABNORMAL HIGH (ref 0–44)
AST: 121 U/L — ABNORMAL HIGH (ref 15–41)
Albumin: 4.3 g/dL (ref 3.5–5.0)
Alkaline Phosphatase: 65 U/L (ref 38–126)
Anion gap: 10 (ref 5–15)
BUN: 22 mg/dL (ref 8–23)
CO2: 24 mmol/L (ref 22–32)
Calcium: 9.7 mg/dL (ref 8.9–10.3)
Chloride: 103 mmol/L (ref 98–111)
Creatinine: 1.49 mg/dL — ABNORMAL HIGH (ref 0.44–1.00)
GFR, Estimated: 39 mL/min — ABNORMAL LOW (ref >60)
Glucose, Bld: 121 mg/dL — ABNORMAL HIGH (ref 70–99)
Potassium: 4.3 mmol/L (ref 3.5–5.1)
Sodium: 137 mmol/L (ref 135–145)
Total Bilirubin: 1.3 mg/dL — ABNORMAL HIGH (ref 0.3–1.2)
Total Protein: 6.1 g/dL — ABNORMAL LOW (ref 6.5–8.1)

## 2021-03-01 LAB — LACTATE DEHYDROGENASE: LDH: 363 U/L — ABNORMAL HIGH (ref 98–192)

## 2021-03-01 MED ORDER — TEMAZEPAM 30 MG PO CAPS: 30.0000 mg | ORAL_CAPSULE | Freq: Every evening | ORAL | 0 refills | Status: DC | PRN

## 2021-03-01 MED ORDER — PROCHLORPERAZINE MALEATE 10 MG PO TABS: 10.0000 mg | ORAL_TABLET | Freq: Four times a day (QID) | ORAL | 0 refills | Status: DC | PRN

## 2021-03-01 NOTE — Progress Notes (Signed)
[facility] Telephone:([phone]) [phone]   Fax:([phone]) [phone]  OFFICE PROGRESS NOTE  [name], MD [address] [place] [address]  DIAGNOSIS: Stage IV non-Hodgkin's lymphoma, mantle cell lymphoma.  She presented with left iliac and retroperitoneal lymphadenopathy.  She also presented with left axillary lymphadenopathy and bone marrow involvement.  She was diagnosed in December 2020.   PRIOR THERAPY:  1) Systemic chemotherapy with R-CHOP with Neulasta support.  First dose on 01/27/2019. Status post 6 cycles.   2) Acalabrutinib 100 mg p.o. twice daily.  First dose started June 17, 2020.  Status post 3 months of treatment. 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 female returns to the clinic today for follow-up visit accompanied by her daughter Natalie Baker The patient is feeling fine today with no concerning complaints except for the baseline fatigue and weakness in addition to lack of sleep and intermittent nausea.  She is currently on Zofran but no improvement of her nausea.  She denied having any current chest pain, shortness of breath, cough or hemoptysis.  She has no fever or chills.  She has no abdominal pain, diarrhea or constipation.  She has no headache or visual changes.  She underwent the CAR-T infusion with Tecartus on January 23, 2021.  She had some evidence of cytokine release syndrome during the infusion but she was able to complete her infusion as planned.  [facility] She is here for evaluation and close monitoring of her condition.   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  MEDICATIONS:  Current Outpatient Medications  Medication Sig Dispense Refill   Acalabrutinib Maleate (CALQUENCE) 100 MG TABS Take 1 tablet by mouth in the morning and at bedtime. 60 tablet 1   acetaminophen (TYLENOL) 500 MG tablet Take 500 mg by mouth every 6 (six) hours as needed for moderate pain.     cetirizine (ZYRTEC) 10 MG tablet Take 5 mg by mouth daily as needed for allergies.     Cholecalciferol 125 MCG (5000 UT) capsule Take 5,000 Units by mouth daily.     estradiol (VIVELLE-DOT) 0.05 MG/24HR patch Place 1 patch onto the skin 2 (two) times a week.     FARXIGA 10 MG TABS tablet TAKE 1 TABLET (10 MG TOTAL) BY MOUTH DAILY BEFORE BREAKFAST. 90 tablet 1   levothyroxine (SYNTHROID) 100 MCG tablet TAKE 1 TABLET BY MOUTH DAILY ON AN EMPTY STOMACH WITH ONLY WATER FOR 30 MINUTES & NO ANTACID/CALCIUM/MAGNESIUM FOR 4 HOURS & AVOID BIOTIN 90 tablet 1   losartan (COZAAR) 50 MG tablet Take 1 tablet (50  mg total) by mouth in the morning and at bedtime. 60 tablet 6   Potassium Chloride ER 20 MEQ TBCR Take 1 tablet by mouth as needed. Take with Torsemide 90 tablet 3   spironolactone (ALDACTONE) 25 MG tablet TAKE 1 TABLET BY MOUTH EVERY DAY 90 tablet 3   torsemide (DEMADEX) 20 MG tablet Take 10 mg by mouth 2 (two) times daily as needed (swelling).     No current facility-administered medications for this visit.    SURGICAL HISTORY:  Past Surgical History:  Procedure Laterality Date   ABDOMINAL HYSTERECTOMY  1997   CESAREAN SECTION  1983   CHOLECYSTECTOMY N/A  09/25/2020   Procedure: LAPAROSCOPIC CHOLECYSTECTOMY;  Surgeon: Kinsinger, Arta Bruce, MD;  Location: [facility];  Service: General;  Laterality: N/A;  60 MINUTES   COLONOSCOPY W/ BIOPSIES  12/29/2006   IR IMAGING GUIDED PORT INSERTION  01/15/2019   LAPAROSCOPY     1991 for emdometriosis   RIGHT/LEFT HEART CATH AND CORONARY ANGIOGRAPHY N/A 06/25/2019   Procedure: RIGHT/LEFT HEART CATH AND CORONARY ANGIOGRAPHY;  Surgeon: Jolaine Artist, MD;  Location: [facility];  Service: Cardiovascular;  Laterality: N/A;   UPPER GASTROINTESTINAL ENDOSCOPY      REVIEW OF SYSTEMS:  Constitutional: positive for fatigue Eyes: negative Ears, nose, mouth, throat, and face: negative Respiratory: negative Cardiovascular: negative Gastrointestinal: positive for nausea Genitourinary:negative Integument/breast: negative Hematologic/lymphatic: negative Musculoskeletal:negative Neurological: negative Behavioral/Psych: positive for sleep disturbance Endocrine: negative Allergic/Immunologic: negative   PHYSICAL EXAMINATION: General appearance: alert, cooperative, fatigued, and no distress Head: Normocephalic, without obvious abnormality, atraumatic Neck: no adenopathy, no JVD, supple, symmetrical, trachea midline, and thyroid not enlarged, symmetric, no tenderness/mass/nodules Lymph nodes: Cervical, supraclavicular, and axillary nodes normal. Resp: clear to auscultation bilaterally Back: symmetric, no curvature. ROM normal. No CVA tenderness. Cardio: regular rate and rhythm, S1, S2 normal, no murmur, click, rub or gallop GI: soft, non-tender; bowel sounds normal; no masses,  no organomegaly Extremities: extremities normal, atraumatic, no cyanosis or edema Neurologic: Alert and oriented X 3, normal strength and tone. Normal symmetric reflexes. Normal coordination and gait  ECOG PERFORMANCE STATUS: 1 - Symptomatic but completely ambulatory  Blood pressure 101/79, pulse (!) 123, temperature (!) 96.5 F  (35.8 C), temperature source Tympanic, resp. rate (!) 21, height $RemoveBe'5\' 2"'fbfvdBzPv$  (1.575 m), weight 190 lb 1.6 oz (86.2 kg), SpO2 98 %.  LABORATORY DATA: Lab Results  Component Value Date   WBC 5.6 12/12/2020   HGB 12.9 12/12/2020   HCT 41.7 12/12/2020   MCV 84.6 12/12/2020   PLT 148 12/12/2020      Chemistry      Component Value Date/Time   NA 142 12/12/2020 0957   K 4.9 12/12/2020 0957   CL 103 12/12/2020 0957   CO2 31 12/12/2020 0957   BUN 16 12/12/2020 0957   CREATININE 1.03 12/12/2020 0957      Component Value Date/Time   CALCIUM 9.8 12/12/2020 0957   ALKPHOS 116 12/05/2020 1006   AST 18 12/12/2020 0957   AST 21 12/05/2020 1006   ALT 18 12/12/2020 0957   ALT 24 12/05/2020 1006   BILITOT 0.4 12/12/2020 0957   BILITOT 0.7 12/05/2020 1006       RADIOGRAPHIC STUDIES: No results found.   ASSESSMENT AND PLAN: This is a very pleasant 66 years old white female recently diagnosed with stage IV less aggressive mantle cell lymphoma presented with left iliac and retroperitoneal lymphadenopathy as well as left axillary and bone marrow involvement in December 2020. The patient  completed 6 cycles of systemic chemotherapy with R-CHOP.  She tolerated her treatment fairly well with no concerning adverse effects but few months later she developed congestive heart failure with drop in her ejection fraction to the 20-25%.  This has improved over the last few months and currently around 55%.  She is followed by Dr. Haroldine Laws. Unfortunately repeat CT scan in June 2022 showed evidence for disease progression with mild increase in the small bilateral axillary lymph nodes and moderate increase in the left supraclavicular as well as ileocolic mesenteric lymph nodes and left external iliac lymph nodes but mild increase in the periaortic retroperitoneal lymph nodes. The patient is started treatment with acalabrutinib 100 mg p.o. twice daily on June 17, 2020  She was seen by Dr. Laverle Hobby at [facility] on July 12, 2020 for discussion of potential autologous stem cell transplant.   The patient had repeat CT scan of the chest, abdomen pelvis performed recently that showed decrease in the size of mesenteric lymph node/mass of the right hemiabdomen, left external iliac lymph nodes and lymph nodes adjacent to the pancreatic tail/splenic hilum but there was mild increase in the size of the mediastinal paraesophageal nodes and retroperitoneal periaortic lymph node as well as increase in the size of the spleen. 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 She continues to recover slowly from this procedure.  She complains of increasing fatigue and weakness as well as lack of sleep and nausea. I will continue to monitor her blood count closely and consider The patient for transfusion as needed. For the nausea I will give her prescription for Compazine and if no improvement, I may consider her for treatment with Zyprexa. For the lack of sleep, I gave her prescription for Restoril 30 mg p.o. nightly as needed. I will see the patient on monthly basis with repeat blood work. She was advised to call immediately if she has any other concerning symptoms in the interval. The patient voices understanding of current disease status and treatment options and is in agreement with the current care plan.  All questions were answered. The patient knows to call the clinic with any problems, questions or concerns. We can certainly see the patient much sooner if necessary.  Disclaimer: This note was dictated with voice recognition software. Similar sounding words can inadvertently be transcribed and may not be corrected upon review.

## 2021-03-01 NOTE — Addendum Note (Signed)
Addended by: Ardeen Garland on: 03/01/2021 12:54 PM   Modules accepted: Orders

## 2021-03-02 ENCOUNTER — Other Ambulatory Visit (HOSPITAL_COMMUNITY): Payer: Self-pay | Admitting: Cardiology

## 2021-03-07 ENCOUNTER — Inpatient Hospital Stay (HOSPITAL_COMMUNITY)
Admission: AD | Admit: 2021-03-07 | Discharge: 2021-03-19 | DRG: 291 | Disposition: A | Discharge status: Home / Self Care | Payer: Medicare Other | Source: Ambulatory Visit | Attending: Internal Medicine | Admitting: Internal Medicine

## 2021-03-07 ENCOUNTER — Ambulatory Visit
Admission: RE | Admit: 2021-03-07 | Discharge: 2021-03-07 | Disposition: A | Discharge status: Home / Self Care | Payer: Self-pay | Source: Ambulatory Visit | Attending: Family Medicine | Admitting: Family Medicine

## 2021-03-07 ENCOUNTER — Other Ambulatory Visit: Payer: Self-pay

## 2021-03-07 ENCOUNTER — Ambulatory Visit (HOSPITAL_COMMUNITY)
Admission: RE | Admit: 2021-03-07 | Discharge: 2021-03-07 | Disposition: A | Discharge status: Home / Self Care | Payer: Medicare Other | Source: Ambulatory Visit | Attending: Internal Medicine | Admitting: Internal Medicine

## 2021-03-07 VITALS — BP 100/80 | HR 137 | Wt 195.0 lb

## 2021-03-07 DIAGNOSIS — F419 Anxiety disorder, unspecified: Secondary | ICD-10-CM | POA: Diagnosis present

## 2021-03-07 DIAGNOSIS — M810 Age-related osteoporosis without current pathological fracture: Secondary | ICD-10-CM | POA: Diagnosis present

## 2021-03-07 DIAGNOSIS — I110 Hypertensive heart disease with heart failure: Secondary | ICD-10-CM | POA: Diagnosis present

## 2021-03-07 DIAGNOSIS — I5022 Chronic systolic (congestive) heart failure: Secondary | ICD-10-CM

## 2021-03-07 DIAGNOSIS — I5023 Acute on chronic systolic (congestive) heart failure: Secondary | ICD-10-CM

## 2021-03-07 DIAGNOSIS — Z833 Family history of diabetes mellitus: Secondary | ICD-10-CM

## 2021-03-07 DIAGNOSIS — Z803 Family history of malignant neoplasm of breast: Secondary | ICD-10-CM

## 2021-03-07 DIAGNOSIS — C8318 Mantle cell lymphoma, lymph nodes of multiple sites: Secondary | ICD-10-CM | POA: Diagnosis present

## 2021-03-07 DIAGNOSIS — Z87891 Personal history of nicotine dependence: Secondary | ICD-10-CM | POA: Diagnosis not present

## 2021-03-07 DIAGNOSIS — R0602 Shortness of breath: Secondary | ICD-10-CM | POA: Diagnosis present

## 2021-03-07 DIAGNOSIS — Z90710 Acquired absence of both cervix and uterus: Secondary | ICD-10-CM

## 2021-03-07 DIAGNOSIS — D6959 Other secondary thrombocytopenia: Secondary | ICD-10-CM | POA: Diagnosis present

## 2021-03-07 DIAGNOSIS — Z8349 Family history of other endocrine, nutritional and metabolic diseases: Secondary | ICD-10-CM

## 2021-03-07 DIAGNOSIS — Z8249 Family history of ischemic heart disease and other diseases of the circulatory system: Secondary | ICD-10-CM

## 2021-03-07 DIAGNOSIS — E039 Hypothyroidism, unspecified: Secondary | ICD-10-CM | POA: Diagnosis present

## 2021-03-07 DIAGNOSIS — I493 Ventricular premature depolarization: Secondary | ICD-10-CM | POA: Diagnosis present

## 2021-03-07 DIAGNOSIS — N179 Acute kidney failure, unspecified: Secondary | ICD-10-CM | POA: Diagnosis present

## 2021-03-07 DIAGNOSIS — Z79899 Other long term (current) drug therapy: Secondary | ICD-10-CM

## 2021-03-07 DIAGNOSIS — E559 Vitamin D deficiency, unspecified: Secondary | ICD-10-CM | POA: Diagnosis present

## 2021-03-07 DIAGNOSIS — Z7901 Long term (current) use of anticoagulants: Secondary | ICD-10-CM

## 2021-03-07 DIAGNOSIS — E876 Hypokalemia: Secondary | ICD-10-CM | POA: Diagnosis not present

## 2021-03-07 DIAGNOSIS — I472 Ventricular tachycardia, unspecified: Secondary | ICD-10-CM | POA: Diagnosis not present

## 2021-03-07 DIAGNOSIS — T451X5A Adverse effect of antineoplastic and immunosuppressive drugs, initial encounter: Secondary | ICD-10-CM | POA: Diagnosis present

## 2021-03-07 DIAGNOSIS — R000 Tachycardia, unspecified: Secondary | ICD-10-CM

## 2021-03-07 DIAGNOSIS — I428 Other cardiomyopathies: Secondary | ICD-10-CM | POA: Diagnosis present

## 2021-03-07 DIAGNOSIS — D701 Agranulocytosis secondary to cancer chemotherapy: Secondary | ICD-10-CM | POA: Diagnosis present

## 2021-03-07 DIAGNOSIS — I82622 Acute embolism and thrombosis of deep veins of left upper extremity: Secondary | ICD-10-CM | POA: Diagnosis present

## 2021-03-07 DIAGNOSIS — M79602 Pain in left arm: Secondary | ICD-10-CM | POA: Diagnosis not present

## 2021-03-07 DIAGNOSIS — C8310 Mantle cell lymphoma, unspecified site: Secondary | ICD-10-CM

## 2021-03-07 DIAGNOSIS — Z20822 Contact with and (suspected) exposure to covid-19: Secondary | ICD-10-CM | POA: Diagnosis present

## 2021-03-07 LAB — COMPREHENSIVE METABOLIC PANEL
ALT: 381 U/L — ABNORMAL HIGH (ref 0–44)
AST: 127 U/L — ABNORMAL HIGH (ref 15–41)
Albumin: 4.2 g/dL (ref 3.5–5.0)
Alkaline Phosphatase: 64 U/L (ref 38–126)
Anion gap: 11 (ref 5–15)
BUN: 13 mg/dL (ref 8–23)
CO2: 20 mmol/L — ABNORMAL LOW (ref 22–32)
Calcium: 9.2 mg/dL (ref 8.9–10.3)
Chloride: 102 mmol/L (ref 98–111)
Creatinine, Ser: 1.2 mg/dL — ABNORMAL HIGH (ref 0.44–1.00)
GFR, Estimated: 50 mL/min — ABNORMAL LOW (ref >60)
Glucose, Bld: 124 mg/dL — ABNORMAL HIGH (ref 70–99)
Potassium: 4.3 mmol/L (ref 3.5–5.1)
Sodium: 133 mmol/L — ABNORMAL LOW (ref 135–145)
Total Bilirubin: 1.1 mg/dL (ref 0.3–1.2)
Total Protein: 5.7 g/dL — ABNORMAL LOW (ref 6.5–8.1)

## 2021-03-07 LAB — CBC
HCT: 41.4 % (ref 36.0–46.0)
Hemoglobin: 13.6 g/dL (ref 12.0–15.0)
MCH: 30.4 pg (ref 26.0–34.0)
MCHC: 32.9 g/dL (ref 30.0–36.0)
MCV: 92.6 fL (ref 80.0–100.0)
Platelets: 139 10*3/uL — ABNORMAL LOW (ref 150–400)
RBC: 4.47 MIL/uL (ref 3.87–5.11)
RDW: 22.3 % — ABNORMAL HIGH (ref 11.5–15.5)
WBC: 4.5 10*3/uL (ref 4.0–10.5)
nRBC: 0.4 % — ABNORMAL HIGH (ref 0.0–0.2)

## 2021-03-07 LAB — PROTIME-INR
INR: 1.2 (ref 0.8–1.2)
Prothrombin Time: 15.3 seconds — ABNORMAL HIGH (ref 11.4–15.2)

## 2021-03-07 LAB — FIBRINOGEN: Fibrinogen: 151 mg/dL — ABNORMAL LOW (ref 210–475)

## 2021-03-07 LAB — BRAIN NATRIURETIC PEPTIDE: B Natriuretic Peptide: 2157.9 pg/mL — ABNORMAL HIGH (ref 0.0–100.0)

## 2021-03-07 LAB — MRSA NEXT GEN BY PCR, NASAL: MRSA by PCR Next Gen: NOT DETECTED

## 2021-03-07 MED ORDER — FUROSEMIDE 10 MG/ML IJ SOLN
80.0000 mg | Freq: Two times a day (BID) | INTRAMUSCULAR | Status: DC
  Administered 2021-03-07 – 2021-03-10 (×6): 80 mg via INTRAVENOUS
  Filled 2021-03-07 (×6): qty 8

## 2021-03-07 MED ORDER — ONDANSETRON HCL 4 MG/2ML IJ SOLN
4.0000 mg | Freq: Four times a day (QID) | INTRAMUSCULAR | Status: DC | PRN
  Administered 2021-03-11 – 2021-03-19 (×4): 4 mg via INTRAVENOUS
  Filled 2021-03-07 (×4): qty 2

## 2021-03-07 MED ORDER — SODIUM CHLORIDE 0.9% FLUSH: 3.0000 mL | INTRAVENOUS | Status: DC | PRN

## 2021-03-07 MED ORDER — LEVOTHYROXINE SODIUM 100 MCG PO TABS
100.0000 ug | ORAL_TABLET | Freq: Every day | ORAL | Status: DC
  Administered 2021-03-08 – 2021-03-19 (×12): 100 ug via ORAL
  Filled 2021-03-07 (×12): qty 1

## 2021-03-07 MED ORDER — TEMAZEPAM 15 MG PO CAPS
30.0000 mg | ORAL_CAPSULE | Freq: Every evening | ORAL | Status: DC | PRN
  Administered 2021-03-17 – 2021-03-18 (×2): 30 mg via ORAL
  Filled 2021-03-07 (×2): qty 2

## 2021-03-07 MED ORDER — DIGOXIN 125 MCG PO TABS
0.1250 mg | ORAL_TABLET | Freq: Every day | ORAL | Status: DC
  Administered 2021-03-07 – 2021-03-19 (×13): 0.125 mg via ORAL
  Filled 2021-03-07 (×13): qty 1

## 2021-03-07 MED ORDER — SODIUM CHLORIDE 0.9% FLUSH
10.0000 mL | Freq: Two times a day (BID) | INTRAVENOUS | Status: DC
  Administered 2021-03-07 – 2021-03-19 (×14): 10 mL

## 2021-03-07 MED ORDER — SODIUM CHLORIDE 0.9% FLUSH
3.0000 mL | Freq: Two times a day (BID) | INTRAVENOUS | Status: DC
  Administered 2021-03-07 – 2021-03-18 (×12): 3 mL via INTRAVENOUS

## 2021-03-07 MED ORDER — CHLORHEXIDINE GLUCONATE CLOTH 2 % EX PADS
6.0000 | MEDICATED_PAD | Freq: Every day | CUTANEOUS | Status: DC
  Administered 2021-03-07 – 2021-03-19 (×12): 6 via TOPICAL

## 2021-03-07 MED ORDER — SULFAMETHOXAZOLE-TRIMETHOPRIM 800-160 MG PO TABS
1.0000 | ORAL_TABLET | ORAL | Status: DC
  Administered 2021-03-08: 1 via ORAL
  Filled 2021-03-07: qty 1

## 2021-03-07 MED ORDER — SODIUM CHLORIDE 0.9% FLUSH: 10.0000 mL | INTRAVENOUS | Status: DC | PRN

## 2021-03-07 MED ORDER — ACETAMINOPHEN 325 MG PO TABS: 650.0000 mg | ORAL_TABLET | ORAL | Status: DC | PRN

## 2021-03-07 MED ORDER — SODIUM CHLORIDE 0.9 % IV SOLN: 250.0000 mL | INTRAVENOUS | Status: DC | PRN

## 2021-03-07 MED ORDER — VALACYCLOVIR HCL 500 MG PO TABS
500.0000 mg | ORAL_TABLET | Freq: Two times a day (BID) | ORAL | Status: DC
Start: 2021-03-07 — End: 2021-03-19
  Administered 2021-03-07 – 2021-03-19 (×24): 500 mg via ORAL
  Filled 2021-03-07 (×25): qty 1

## 2021-03-07 NOTE — Progress Notes (Signed)
ReDS Vest / Clip - 03/07/21 1600   ? ?  ? ReDS Vest / Clip  ? Station Marker A   ? Ruler Value 30   ? ReDS Value Range High volume overload   ? ReDS Actual Value 51   ? ?  ?  ? ?  ? ? ?

## 2021-03-07 NOTE — H&P (Addendum)
Advanced Heart Failure Team History and Physical Note   PCP:  [name], MD  PCP-Cardiology: Elouise Munroe, MD     Reason for Admission: Acute on Chronic Systolic Heart Failure  HPI:    Natalie Baker is a 66 y.o. female with a hx of palpitations thought to be PVCs, HTN, non hodgkins lymphoma treated w/ R-CHOP and systolic HF.   Admitted 6/21 with acute HF. ECHO  EF 20-25% w/ mod-severe MR.  RV moderately reduced. Developed VT and was started on amiodarone and transferred to [facility] for  [facility] which showed normal coronaries, elevated filling pressures w/ low CO c/w cardiogenic shock. CI was 1.7. She was started on milrinone, IV amiodarone and IV Lasix for diuresis.   Echo 9/21: EF 40-45%   On repeat echo 05/04/20 EF had normalized to 50-55% G2DD.    At oncology f/u 6/22,  repeat CT scan unfortunately showed evidence for disease progression with mild increase in the small bilateral axillary lymph nodes and moderate increase in the left supraclavicular as well as ileocolic mesenteric lymph nodes and left external iliac lymph nodes but mild increase in the periaortic retroperitoneal lymph nodes. She started treatment with acalabrutinib 100 mg p.o. twice daily on June 17 2020 and Dr. Julien Nordmann referred her to [facility] for bone marrow transplant evaluation. As part of w/u at [facility], an echocardiogram was obtained and showed drop in LVEF back down to 40% w/ echodensity vs thickening of the left ventricular apex concerning for possible LV thrombus. Subsequently, she has been referred back to [facility] for further evaluation.    Follow up 10/22, stable NYHA II symptoms, losartan increased and cMRI arranged to assess for LV thrombus.    cMRI 11/22 LVEF 39%, no LV thrombus noted.   Follow up 11/22 Not a candidate for stem cell transplant, but will trial CAR-T therapy soon.    Presented to HF clinic for visit today. Recently d/c from [facility] after CAR-T therapy, had cytokine storm, taken off all HF meds  due to low BP. Echo 1/23 with EF 40-45%. She was continued on beta blocker but has been out x 3-4 days. She took spiro for a couple of days for increased LE swelling. She feels chest pressure "like rocks sitting on my chest." Remains fatigued and sleeps poorly. Has significant anxiety surrounding her medical issues. She is SOB with ADLs. Denies dizziness, or abnormal bleeding. Appetite poor. No fever or chills. Weight at home 191 pounds.    Bedside echo today (03/07/21), EF ~20%.   Cardiac Studies: Echo 06/2019 EF 20-25%  moderately reduced RV function with severe MR/TR Echo 9/21: EF 40-45%, RV normal  Echo  4/22: EF 50-55% G2DD. RV normal  Echo [facility] 10/04/20: EF 40%, grade I DD, RV ok, mild MR, mild LAE, small circumferential pericardial effusion, Echodensity vs thickening of the left ventricular apex, cannot rule out thrombus   cMRI 11/22 EF 39%, no LV thrombus   Cath 6/18 with normal cors. Low output. Ao = 109/71 (81) LV =  99/23 RA =  15 RV =  43/18 PA =  35/14 (25) PCW = 22 Fick cardiac output/index = 3.3/1.7 PVR = < 1.0 WU Ao sat = 100% PA sat = 49%, 53%    Assessment: 1. Normal coronary arteries 2. Severe NICM EF 20% 3. Elevated filling pressures and low CO c/w cardiogenic shock 4. Brief run of VT   Review of Systems: [y] = yes, [ ]  = no   General:  Weight gain [ ] ; Weight loss [ ] ; Anorexia [ ] ; Fatigue [ y]; Fever [ ] ; Chills [ ] ; Weakness Blue.Reese ]  Cardiac: Chest pain/pressure Blue.Reese ]; Resting SOB [ ] ; Exertional SOB Blue.Reese ]; Orthopnea [ ] ; Pedal Edema Blue.Reese ]; Palpitations [ ] ; Syncope [ ] ; Presyncope [ ] ; Paroxysmal nocturnal dyspnea[ ]   Pulmonary: Cough [ ] ; Wheezing[ ] ; Hemoptysis[ ] ; Sputum [ ] ; Snoring [ ]   GI: Vomiting[ ] ; Dysphagia[ ] ; Melena[ ] ; Hematochezia [ ] ; Heartburn[ ] ; Abdominal pain [ ] ; Constipation [ ] ; Diarrhea [ ] ; BRBPR [ ]   GU: Hematuria[ ] ; Dysuria [ ] ; Nocturia[ ]   Vascular: Pain in legs with walking [ ] ; Pain in feet with lying flat [ ] ; Non-healing sores [ ] ;  Stroke [ ] ; TIA [ ] ; Slurred speech [ ] ;  Neuro: Headaches[ ] ; Vertigo[ ] ; Seizures[ ] ; Paresthesias[ ] ;Blurred vision [ ] ; Diplopia [ ] ; Vision changes [ ]   Ortho/Skin: Arthritis [ ] ; Joint pain [ ] ; Muscle pain [ ] ; Joint swelling [ ] ; Back Pain [ ] ; Rash [ ]   Psych: Depression[ ] ; Anxiety[y ]  Heme: Bleeding problems [ ] ; Clotting disorders [ ] ; Anemia Blue.Reese ]  Endocrine: Diabetes [ ] ; Thyroid dysfunction[ ]    Home Medications Prior to Admission medications   Medication Sig Start Date End Date Taking? Authorizing Provider  estradiol (VIVELLE-DOT) 0.05 MG/24HR patch Place 1 patch onto the skin 2 (two) times a week. 08/08/20   [provider]  levothyroxine (SYNTHROID) 100 MCG tablet TAKE 1 TABLET BY MOUTH DAILY ON AN EMPTY STOMACH WITH ONLY WATER FOR 30 MINUTES & NO ANTACID/CALCIUM/MAGNESIUM FOR 4 HOURS & AVOID BIOTIN 01/26/21   Liane Comber, NP  metoprolol succinate (TOPROL-XL) 25 MG 24 hr tablet Take 1 tablet by mouth daily. 03/05/21 03/05/22  [provider]  prochlorperazine (COMPAZINE) 10 MG tablet Take 1 tablet (10 mg total) by mouth every 6 (six) hours as needed for nausea or vomiting. 03/01/21   Curt Bears, MD  sulfamethoxazole-trimethoprim (BACTRIM DS) 800-160 MG tablet Take 1 tablet by mouth 2 (two) times a week. Saturday and Sunday 02/26/21   [provider]  temazepam (RESTORIL) 30 MG capsule Take 1 capsule (30 mg total) by mouth at bedtime as needed for sleep. 03/01/21   Mohamed, Mohamed, MD  valACYclovir (VALTREX) 500 MG tablet Take 500 mg by mouth 2 (two) times daily. 01/31/21   [provider]    Past Medical History: Past Medical History:  Diagnosis Date   Allergy    Cancer (HCC)    being worked up for lymphoma   CHF (congestive heart failure) (HCC)    Colon polyp 12/29/2006   Dysrhythmia    Frozen shoulder 11/16/2015   Hyperlipidemia    Hypothyroidism    Obesity    BMI 34   Osteoporosis    Palpitations    Pre-diabetes     Vitamin D deficiency     Past Surgical History: Past Surgical History:  Procedure Laterality Date   ABDOMINAL HYSTERECTOMY  1997   CESAREAN SECTION  1983   CHOLECYSTECTOMY N/A 09/25/2020   Procedure: LAPAROSCOPIC CHOLECYSTECTOMY;  Surgeon: Kinsinger, Luke Aaron, MD;  Location: [facility];  Service: General;  Laterality: N/A;  60 MINUTES   COLONOSCOPY W/ BIOPSIES  12/29/2006   IR IMAGING GUIDED PORT INSERTION  01/15/2019   LAPAROSCOPY     [age] for emdometriosis   RIGHT/LEFT HEART CATH AND CORONARY ANGIOGRAPHY N/A 06/25/2019   Procedure: RIGHT/LEFT HEART CATH AND CORONARY ANGIOGRAPHY;  Surgeon: Haroldine Laws,  Shaune Pascal, MD;  Location: [facility];  Service: Cardiovascular;  Laterality: N/A;   UPPER GASTROINTESTINAL ENDOSCOPY      Family History:  Family History  Problem Relation Age of Onset   Heart disease Mother 40       chf   Diabetes Mother    Thyroid disease Mother    Cancer Mother 28       female/ skin   Hypertension Mother    Liver disease Mother    Hypertension Father    Alcohol abuse Father    Heart disease Father    Breast cancer Sister 4   Diabetes Sister    Hypothyroidism Sister    Hashimoto's thyroiditis Sister    Hyperlipidemia Brother    Hypertension Brother        x 2   Diabetes Brother    Hypothyroidism Brother    Prostate cancer Brother 81   Liver disease Maternal Grandmother    Diabetes Paternal Grandmother    Breast cancer Maternal Aunt    Hyperlipidemia Maternal Aunt    Heart disease Maternal Aunt    Bone cancer Maternal Aunt        metastatic   Breast cancer Niece 19   Colon cancer Neg Hx    Colon polyps Neg Hx    Esophageal cancer Neg Hx    Rectal cancer Neg Hx    Stomach cancer Neg Hx     Social History: Social History   Socioeconomic History   Marital status: Married    Spouse name: Not on file   Number of children: 1   Years of education: Not on file   Highest education level: Not on file  Occupational History   Occupation:  Theatre manager  Tobacco Use   Smoking status: Former    Years: 15.00    Types: Cigarettes    Quit date: 01/08/1996    Years since quitting: 25.1   Smokeless tobacco: Never   Tobacco comments:    quit 1998 but is exposed to 2nd hand smoke  Vaping Use   Vaping Use: Never used  Substance and Sexual Activity   Alcohol use: No   Drug use: No   Sexual activity: Yes    Partners: Male    Birth control/protection: Post-menopausal  Other Topics Concern   Not on file  Social History Narrative   Married, one daughter, 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  Negative Chest Xray 06/16/12    Objective:    Vital Signs:    BP 100/80    Pulse (!) 137    Wt 88.5 kg (195 lb)    SpO2 96%    BMI 35.67 kg/m   Physical Exam     General:  NAD. No resp difficulty, arrived in WC, fatigued-appearing, pale HEENT: Normal Neck: Supple. JVP to jaw. Carotids 2+ bilat; no bruits. No lymphadenopathy or thryomegaly appreciated. Cor: PMI nondisplaced. Tachy rate & rhythm. No rubs, gallops or murmurs. Lungs: Clear, diminished in BS Abdomen:  Obese, nontender, nondistended. No hepatosplenomegaly. No bruits or masses. Good bowel sounds. Extremities: No cyanosis, clubbing, rash, 2+ BLE edema to thighs Neuro: Alert & oriented x 3, cranial nerves grossly intact. Moves all 4 extremities w/o difficulty. Affect pleasant.  Telemetry   EKG   ST  Labs     Basic Metabolic Panel: Recent Labs  Lab 03/01/21 1218  NA 137  K 4.3  CL 103  CO2 24  GLUCOSE 121*  BUN 22   CREATININE 1.49*  CALCIUM 9.7    Liver Function Tests: Recent Labs  Lab 03/01/21 1218  AST 121*  ALT 161*  ALKPHOS 65  BILITOT 1.3*  PROT 6.1*  ALBUMIN 4.3   No results for input(s): LIPASE, AMYLASE in the last 168 hours. No results for input(s): AMMONIA in the last 168 hours.  CBC: Recent Labs  Lab 03/01/21 1218  WBC 5.0  NEUTROABS 3.8  HGB 13.4  HCT 41.0  MCV 93.2  PLT 188    Cardiac Enzymes: No results for input(s): CKTOTAL, CKMB, CKMBINDEX, TROPONINI in the last 168 hours.  BNP: BNP (last 3 results) No results for input(s): BNP in the last 8760 hours.  ProBNP (last 3 results) No results for input(s): PROBNP in the last 8760 hours.   CBG: No results for input(s): GLUCAP in the last 168 hours.  Coagulation Studies: No results for input(s): LABPROT, INR in the last 72 hours.  Imaging: No results found.   Patient Profile  Natalie Baker is a 66 y.o. female with a hx of palpitations thought to be PVCs, HTN, non hodgkins lymphoma treated w/ R-CHOP and CAR-T, and systolic HF with acute decompensation.  Assessment/Plan  1. Acute on chronic Systolic HF  - Echo 5/64 EF 55-60%. RV normal - Echo 6/21 EF 20-25%  moderately reduced RV function with severe MR/TR - Cath 06/25/19 no CAD. - Drop in EF felt most likely from R-CHOP - Echo 05/04/20 w/ EF 50-55% G2DD. RV normal - Echo 9/22 at [facility] EF back down to 40% w/ ? LV thrombus - cMRI 11/22 LVEF 39%, no LV clot - Echo 1/23 [facility]): EF 40-45% - Bedside echo today, EF 20% per Dr. Haroldine Laws. - NYHA IIIb-IV. She is volume overloaded on exam, ReDs 51%. - Place PICC and follow CoOx. - Give Lasix 80 mg IV bid. - Start digoxin 0.125 mg daily. - Start spiro 12.5 mg daily. - Place TED hose - No beta blocker with acute decompensation. - No SGLT2i for now. - Repeat formal echo. - CMET, CBC, lactic acid pending.   2. H/o VT - Previously had Lifevest. - Check Mag    3. Stage IV non-Hodgkin's lymphoma, mantle cell  lymphoma   - s/p R-CHOP w/ Neulasta. - Evidence of disease progression on f/u Chest CT 6/22. - Scheduled to have repeat bone marrow bx in 1 mo at [facility] - s/p CAR-T therapy.   4. ST - Start digoxin. - Diurese. - No ivabradine or beta blocker for now.  Rafael Bihari, FNP 03/07/2021, 4:53 PM  Advanced Heart Failure Team Pager [phone] (M-F; 7a - 5p)  Please contact [facility] for night-coverage after hours (4p -7a ) and weekends on [email or web address]  Patient seen and examined with the above-signed Advanced Practice Provider and/or Housestaff. I personally reviewed laboratory data, imaging studies and relevant notes. I independently examined the patient and formulated the important aspects of the plan. I have edited the note to reflect any of my changes or salient points. I have personally  discussed the plan with the patient and/or family.  Here for f/u. Underwent recent CART therapy at [facility]. Echo at that time EF down to 40-45%.  Returned to clinic today for f/u. SOB with any exertion. Markedly tachycardic and overloaded. ReDS 49%. Echo done personally. EF 20%. No effusion   Decision made for admission.   General:  Weak appearing. N HEENT: normal x for alopecia Neck: supple. JVP to jaw  Carotids 2+ bilat; no bruits. No lymphadenopathy or thryomegaly appreciated. Cor: PMI nondisplaced. Tachy regular No rubs, gallops or murmurs. Lungs: clear Abdomen: soft, nontender, nondistended. No hepatosplenomegaly. No bruits or masses. Good bowel sounds. Extremities: no cyanosis, clubbing, rash, 2+ edema Neuro: alert & orientedx3, cranial nerves grossly intact. moves all 4 extremities w/o difficulty. Affect pleasant  She is volume overloaded and tachycardic. Echo with EF down to 20%. Concern for low output.   Will admit. Start dig, spiro, IV lasix. Place PICC. May need milrinone.   Glori Bickers, MD  5:05 PM

## 2021-03-07 NOTE — Progress Notes (Signed)
Peripherally Inserted Central Catheter Placement ? ?The IV Nurse has discussed with the patient and/or persons authorized to consent for the patient, the purpose of this procedure and the potential benefits and risks involved with this procedure.  The benefits include less needle sticks, lab draws from the catheter, and the patient may be discharged home with the catheter. Risks include, but not limited to, infection, bleeding, blood clot (thrombus formation), and puncture of an artery; nerve damage and irregular heartbeat and possibility to perform a PICC exchange if needed/ordered by physician.  Alternatives to this procedure were also discussed.  Bard Power PICC patient education guide, fact sheet on infection prevention and patient information card has been provided to patient /or left at bedside.   ? ?PICC Placement Documentation  ?PICC Double Lumen 26/33/35 Left Basilic 42 cm 1 cm (Active)  ?Indication for Insertion or Continuance of Line Chronic illness with exacerbations (CF, Sickle Cell, etc.) 03/07/21 1800  ?Exposed Catheter (cm) 1 cm 03/07/21 1800  ?Site Assessment Clean, Dry, Intact 03/07/21 1800  ?Lumen #1 Status Flushed;Saline locked;Blood return noted 03/07/21 1800  ?Lumen #2 Status Flushed;Saline locked;Blood return noted 03/07/21 1800  ?Dressing Type Securing device;Transparent 03/07/21 1800  ?Dressing Status Antimicrobial disc in place 03/07/21 1800  ?Safety Lock Not Applicable 45/62/56 3893  ?Line Care Connections checked and tightened 03/07/21 1800  ?Dressing Intervention New dressing 03/07/21 1800  ?Dressing Change Due 03/14/21 03/07/21 1800  ? ? ? ? ? ?Holley Bouche Renee ?03/07/2021, 6:45 PM ? ?

## 2021-03-07 NOTE — Progress Notes (Addendum)
PCP: Dr Melford Aase Primary Cardiologist: Dr Haroldine Laws  Oncology: Dr Julien Nordmann  Reason for Visit:  F/u for Chronic Systolic Heart Failure   HPI: Natalie Baker is a 66 y.o. female with a hx of palpitations thought to be PVCs, HTN, non hodgkins lymphoma treated w/ R-CHOP and systolic HF.  Admitted 6/21 with acute HF. ECHO  EF 20-25% w/ mod-severe MR.  RV moderately reduced. Developed VT and was started on amiodarone and transferred to [facility] for  [facility] which showed normal coronaries, elevated filling pressures w/ low CO c/w cardiogenic shock. CI was 1.7. She was started on milrinone, IV amiodarone and IV Lasix for diuresis.  Echo 9/21: EF 40-45%   On repeat echo 05/04/20 EF had normalized to 50-55% G2DD.   At oncology f/u 6/22,  repeat CT scan unfortunately showed evidence for disease progression with mild increase in the small bilateral axillary lymph nodes and moderate increase in the left supraclavicular as well as ileocolic mesenteric lymph nodes and left external iliac lymph nodes but mild increase in the periaortic retroperitoneal lymph nodes. She started treatment with acalabrutinib 100 mg p.o. twice daily on June 17 2020 and Dr. Julien Nordmann referred her to [facility] for bone marrow transplant evaluation. As part of w/u at [facility], an echocardiogram was obtained and showed drop in LVEF back down to 40% w/ echodensity vs thickening of the left ventricular apex concerning for possible LV thrombus. Subsequently, she has been referred back to [facility] for further evaluation.   Follow up 10/22, stable NYHA II symptoms, losartan increased and cMRI arranged to assess for LV thrombus.   cMRI 11/22 LVEF 39%, no LV thrombus noted.  Follow up 11/22 Not a candidate for stem cell transplant, but will trial CAR-T therapy soon.   Today she returns for HF follow up with her daughter. Recently d/c from [facility] after CAR-T therapy, had cytokine storm, taken off all HF meds due to low BP. Echo 1/23 with EF 40-45%. She was continued  on beta blocker but has been out x 3-4 days. Took spiro for a couple of days as legs started swelling. She feels chest pressure "like rocks sitting on my chest." Remains fatigued and sleeps poorly. Has significant anxiety surrounding her medical issues. She is SOB with ADLs. Denies CP, dizziness, or abnormal bleeding. Appetite poor. No fever or chills. Weight at home 191 pounds.   Bedside echo today (03/07/21), EF ~20%.  Studies: Echo 06/2019 EF 20-25%  moderately reduced RV function with severe MR/TR Echo 9/21: EF 40-45%, RV normal  Echo  4/22: EF 50-55% G2DD. RV normal  Echo [facility] 10/04/20: EF 40%, grade I DD, RV ok, mild MR, mild LAE, small circumferential pericardial effusion, Echodensity vs thickening of the left ventricular apex, cannot rule out thrombus  cMRI 11/22 EF 39%, no LV thrombus  Cath 6/18 with normal cors. Low output. Ao = 109/71 (81) LV =  99/23 RA =  15 RV =  43/18 PA =  35/14 (25) PCW = 22 Fick cardiac output/index = 3.3/1.7 PVR = < 1.0 WU Ao sat = 100% PA sat = 49%, 53%   Assessment: 1. Normal coronary arteries 2. Severe NICM EF 20% 3. Elevated filling pressures and low CO c/w cardiogenic shock 4. Brief run of VT   ROS: All systems negative except as listed in HPI, PMH and Problem List.  SH:  Social History   Socioeconomic History   Marital status: Married    Spouse name: Not on file   Number of  children: 1   Years of education: Not on file   Highest education level: Not on file  Occupational History   Occupation: Theatre manager  Tobacco Use   Smoking status: Former    Years: 15.00    Types: Cigarettes    Quit date: 01/08/1996    Years since quitting: 25.1   Smokeless tobacco: Never   Tobacco comments:    quit 1998 but is exposed to 2nd hand smoke  Vaping Use   Vaping Use: Never used  Substance and Sexual Activity   Alcohol use: No   Drug use: No   Sexual activity: Yes    Partners: Male    Birth control/protection: Post-menopausal  Other  Topics Concern   Not on file  Social History Narrative   Married, one daughter, 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   Social Determinants of Radio broadcast assistant Strain: Not on file  Food Insecurity: Not on file  Transportation Needs: Not on file  Physical Activity: Not on file  Stress: Not on file  Social Connections: Not on file  Intimate Partner Violence: Not on file   FH:  Family History  Problem Relation Age of Onset   Heart disease Mother 62       chf   Diabetes Mother    Thyroid disease Mother    Cancer Mother 31       female/ skin   Hypertension Mother    Liver disease Mother    Hypertension Father    Alcohol abuse Father    Heart disease Father    Breast cancer Sister 15   Diabetes Sister    Hypothyroidism Sister    Hashimoto's thyroiditis Sister    Hyperlipidemia Brother    Hypertension Brother        x 2   Diabetes Brother    Hypothyroidism Brother    Prostate cancer Brother 74   Liver disease Maternal Grandmother    Diabetes Paternal Grandmother    Breast cancer Maternal Aunt    Hyperlipidemia Maternal Aunt    Heart disease Maternal Aunt    Bone cancer Maternal Aunt        metastatic   Breast cancer Niece 31   Colon cancer Neg Hx    Colon polyps Neg Hx    Esophageal cancer Neg Hx    Rectal cancer Neg Hx    Stomach cancer Neg Hx    Past Medical History:  Diagnosis Date   Allergy    Cancer ([place])    being worked up for lymphoma   CHF (congestive heart failure) ([place])    Colon polyp 12/29/2006   Dysrhythmia    Frozen shoulder 11/16/2015   Hyperlipidemia    Hypothyroidism    Obesity    BMI 34   Osteoporosis    Palpitations    Pre-diabetes    Vitamin D deficiency    Current Outpatient Medications  Medication Sig Dispense Refill   estradiol (VIVELLE-DOT) 0.05 MG/24HR patch Place 1 patch onto the skin 2 (two) times a week.     levothyroxine (SYNTHROID) 100 MCG tablet TAKE 1 TABLET BY MOUTH DAILY ON AN EMPTY  STOMACH WITH ONLY WATER FOR 30 MINUTES & NO ANTACID/CALCIUM/MAGNESIUM FOR 4 HOURS & AVOID BIOTIN 90 tablet 1   metoprolol succinate (TOPROL-XL) 25 MG 24 hr tablet Take 1 tablet by mouth daily.     prochlorperazine (COMPAZINE) 10 MG tablet Take 1 tablet (10 mg total) by mouth every 6 (six) hours  as needed for nausea or vomiting. 30 tablet 0   sulfamethoxazole-trimethoprim (BACTRIM DS) 800-160 MG tablet Take 1 tablet by mouth 2 (two) times a week. Saturday and Sunday     temazepam (RESTORIL) 30 MG capsule Take 1 capsule (30 mg total) by mouth at bedtime as needed for sleep. 30 capsule 0   valACYclovir (VALTREX) 500 MG tablet Take 500 mg by mouth 2 (two) times daily.     No current facility-administered medications for this encounter.   BP 100/80    Pulse (!) 137    Wt 88.5 kg (195 lb)    SpO2 96%    BMI 35.67 kg/m   Wt Readings from Last 3 Encounters:  03/07/21 88.5 kg (195 lb)  03/01/21 86.2 kg (190 lb 1.6 oz)  12/12/20 90.9 kg (200 lb 6.4 oz)   PHYSICAL EXAM: General:  NAD. No resp difficulty, arrived in WC, fatigued-appearing, pale HEENT: Normal Neck: Supple. JVP to jaw. Carotids 2+ bilat; no bruits. No lymphadenopathy or thryomegaly appreciated. Cor: PMI nondisplaced. Tachy rate & rhythm. No rubs, gallops or murmurs. Lungs: Clear, diminished in BS Abdomen: Obese, nontender, nondistended. No hepatosplenomegaly. No bruits or masses. Good bowel sounds. Extremities: No cyanosis, clubbing, rash, 2+ BLE edema to thighs Neuro: Alert & oriented x 3, cranial nerves grossly intact. Moves all 4 extremities w/o difficulty. Affect pleasant.  ECG: ST 130s  ReDs: 51%  ASSESSMENT & PLAN: 1. Acute on chronic Systolic HF  - Echo 3/29 EF 55-60%. RV normal - Echo 6/21 EF 20-25%  moderately reduced RV function with severe MR/TR - Cath 06/25/19 no CAD. - Drop in EF felt most likely from R-CHOP - Echo 05/04/20 w/ EF 50-55% G2DD. RV normal - Echo 9/22 at [facility] EF back down to 40% w/ ? LV thrombus - cMRI  11/22 LVEF 39%, no LV clot - Echo 1/23 [facility]): EF 40-45% - Bedside echo today, EF 20% per Dr. Haroldine Laws - NYHA IIIb-IV. She is volume overloaded on exam, ReDs 51%. - She needs admission for PICC placement and IV diuresis. - Start digoxin 0.125 mg daily. - Start spiro 12.5 mg daily. - Place TED hose - No beta blocker with decompensation. - No SGLT2i for now. - Repeat formal echo.  2. H/o VT - Previously had Lifevest.    3. Stage IV non-Hodgkin's lymphoma, mantle cell lymphoma   - s/p R-CHOP w/ Neulasta. - Evidence of disease progression on f/u Chest CT 6/22. - Scheduled to have repeat bone marrow bx in 1 mo at [facility] - s/p CAR-T therapy.  4. ST - Start digoxin - Diurese. - No ivabradine or beta blocker for now.   Patient seen and discussed with Dr. Haroldine Laws. She will require admission for IV diuresis. Patient and daughter agreeable.  Rafael Bihari  FNP 4:07 PM

## 2021-03-07 NOTE — Progress Notes (Signed)
Direct admission from the heart failure clinic awake and alert. ?

## 2021-03-07 NOTE — Progress Notes (Signed)
Patient claimed that  she was tested with covid in the other hospital  2 weeks ago and result is negative. ?

## 2021-03-08 ENCOUNTER — Inpatient Hospital Stay (HOSPITAL_COMMUNITY): Payer: Medicare Other

## 2021-03-08 DIAGNOSIS — I5023 Acute on chronic systolic (congestive) heart failure: Secondary | ICD-10-CM

## 2021-03-08 LAB — ECHOCARDIOGRAM COMPLETE
AR max vel: 2.02 cm2
AV Area VTI: 1.85 cm2
AV Area mean vel: 2.12 cm2
AV Mean grad: 3 mmHg
AV Peak grad: 4.7 mmHg
Ao pk vel: 1.08 m/s
MV M vel: 4.25 m/s
MV Peak grad: 72.3 mmHg
S' Lateral: 3.9 cm
Weight: 3001.78 oz

## 2021-03-08 LAB — COOXEMETRY PANEL
Carboxyhemoglobin: 0.7 % (ref 0.5–1.5)
Carboxyhemoglobin: 1.5 % (ref 0.5–1.5)
Carboxyhemoglobin: 1.8 % — ABNORMAL HIGH (ref 0.5–1.5)
Methemoglobin: 1.2 % (ref 0.0–1.5)
Methemoglobin: <0.7 % (ref 0.0–1.5)
Methemoglobin: 0.7 % (ref 0.0–1.5)
O2 Saturation: 34.4 %
O2 Saturation: 47.7 %
O2 Saturation: 57.5 %
Total hemoglobin: 13.9 g/dL (ref 12.0–16.0)
Total hemoglobin: 14 g/dL (ref 12.0–16.0)
Total hemoglobin: 13.1 g/dL (ref 12.0–16.0)

## 2021-03-08 LAB — BASIC METABOLIC PANEL
Anion gap: 9 (ref 5–15)
BUN: 13 mg/dL (ref 8–23)
CO2: 29 mmol/L (ref 22–32)
Calcium: 8.9 mg/dL (ref 8.9–10.3)
Chloride: 98 mmol/L (ref 98–111)
Creatinine, Ser: 1.3 mg/dL — ABNORMAL HIGH (ref 0.44–1.00)
GFR, Estimated: 46 mL/min — ABNORMAL LOW (ref >60)
Glucose, Bld: 106 mg/dL — ABNORMAL HIGH (ref 70–99)
Potassium: 3.3 mmol/L — ABNORMAL LOW (ref 3.5–5.1)
Sodium: 136 mmol/L (ref 135–145)

## 2021-03-08 LAB — LACTIC ACID, PLASMA: Lactic Acid, Venous: 1.7 mmol/L (ref 0.5–1.9)

## 2021-03-08 LAB — MAGNESIUM: Magnesium: 1.6 mg/dL — ABNORMAL LOW (ref 1.7–2.4)

## 2021-03-08 LAB — HIV ANTIBODY (ROUTINE TESTING W REFLEX): HIV Screen 4th Generation wRfx: NONREACTIVE

## 2021-03-08 MED ORDER — POTASSIUM CHLORIDE CRYS ER 20 MEQ PO TBCR
60.0000 meq | EXTENDED_RELEASE_TABLET | ORAL | Status: AC
  Administered 2021-03-08 (×2): 60 meq via ORAL
  Filled 2021-03-08 (×2): qty 3

## 2021-03-08 MED ORDER — ENOXAPARIN SODIUM 40 MG/0.4ML IJ SOSY
40.0000 mg | PREFILLED_SYRINGE | INTRAMUSCULAR | Status: DC
  Administered 2021-03-08 – 2021-03-13 (×5): 40 mg via SUBCUTANEOUS
  Filled 2021-03-08 (×6): qty 0.4

## 2021-03-08 MED ORDER — SULFAMETHOXAZOLE-TRIMETHOPRIM 800-160 MG PO TABS
1.0000 | ORAL_TABLET | ORAL | Status: DC
  Administered 2021-03-10 – 2021-03-18 (×4): 1 via ORAL
  Filled 2021-03-08 (×4): qty 1

## 2021-03-08 MED ORDER — MAGNESIUM SULFATE 4 GM/100ML IV SOLN
4.0000 g | Freq: Once | INTRAVENOUS | Status: AC
  Administered 2021-03-08: 4 g via INTRAVENOUS
  Filled 2021-03-08: qty 100

## 2021-03-08 MED ORDER — PERFLUTREN LIPID MICROSPHERE
1.0000 mL | INTRAVENOUS | Status: AC | PRN
  Administered 2021-03-08: 2 mL via INTRAVENOUS
  Filled 2021-03-08: qty 10

## 2021-03-08 MED ORDER — MILRINONE LACTATE IN DEXTROSE 20-5 MG/100ML-% IV SOLN
0.1250 ug/kg/min | INTRAVENOUS | Status: DC
  Administered 2021-03-08 – 2021-03-11 (×3): 0.25 ug/kg/min via INTRAVENOUS
  Administered 2021-03-11: 0.125 ug/kg/min via INTRAVENOUS
  Administered 2021-03-12 (×2): 0.25 ug/kg/min via INTRAVENOUS
  Administered 2021-03-13 – 2021-03-15 (×4): 0.375 ug/kg/min via INTRAVENOUS
  Administered 2021-03-15 – 2021-03-16 (×2): 0.25 ug/kg/min via INTRAVENOUS
  Administered 2021-03-17: 0.125 ug/kg/min via INTRAVENOUS
  Filled 2021-03-08 (×15): qty 100

## 2021-03-08 MED ORDER — SPIRONOLACTONE 12.5 MG HALF TABLET
12.5000 mg | ORAL_TABLET | Freq: Every day | ORAL | Status: DC
Start: 2021-03-08 — End: 2021-03-19
  Administered 2021-03-08 – 2021-03-19 (×12): 12.5 mg via ORAL
  Filled 2021-03-08 (×12): qty 1

## 2021-03-08 NOTE — Progress Notes (Addendum)
? ? Advanced Heart Failure Rounding Note ? ?PCP-Cardiologist: Elouise Munroe, MD  ? ?Subjective:   ? ?03/01: Direct admit from clinic with a/c CHF ? ?PICC line placed yesterday evening. Awaiting co-ox. ? ?On IV lasix 80 mg BID. 3.1L UOP charted yesterday + unmeasured voids. Weight down 7 lb.  ? ?Scr stable at 1.30, K 3.3.  ? ?Sinus tach with rates 120s-130s, ~ 10 PVCs/ min + runs of NSVT (longest 16 beats) ? ?Dyspnea improving. Denies orthopnea or PND. + LE edema ? ? ?Objective:   ?Weight Range: ?85.1 kg ?Body mass index is 34.31 kg/m?.  ? ?Vital Signs:   ?Temp:  [97.6 ?F (36.4 ?C)-98.7 ?F (37.1 ?C)] 97.8 ?F (36.6 ?C) (03/02 0700) ?Pulse Rate:  [118-136] 132 (03/02 0700) ?Resp:  [16-22] 17 (03/02 0700) ?BP: (106-116)/(59-95) 112/85 (03/02 0700) ?SpO2:  [94 %-97 %] 95 % (03/02 0700) ?Weight:  [85.1 kg-88.4 kg] 85.1 kg (03/02 0400) ?  ? ?Weight change: ?Filed Weights  ? 03/07/21 1808 03/08/21 0400  ?Weight: 88.4 kg 85.1 kg  ? ? ?Intake/Output:  ? ?Intake/Output Summary (Last 24 hours) at 03/08/2021 0734 ?Last data filed at 03/08/2021 5093 ?Gross per 24 hour  ?Intake 240 ml  ?Output 3150 ml  ?Net -2910 ml  ?  ? ? ?Physical Exam  ?  ?General:  No distress. Sitting on side of bed. ?HEENT: Normal ?Neck: Supple. JVP 10-12 cm. Carotids 2+ bilat; no bruits. ?Cor: PMI nondisplaced. Regular rhythm, tachycardic. No rubs, gallops or murmurs. ?Lungs: Clear ?Abdomen: Soft, nontender, nondistended.  ?Extremities: No cyanosis, clubbing, rash, 1+ edema ?Neuro: Alert & orientedx3, cranial nerves grossly intact. moves all 4 extremities w/o difficulty. Affect pleasant ? ? ?Telemetry  ? ?Sinus tach 120s-130s, runs of NSVT longest 16 beats last night, ~ 10 PVCs/min ? ? ?Labs  ?  ?CBC ?Recent Labs  ?  03/07/21 ?1500  ?WBC 4.5  ?HGB 13.6  ?HCT 41.4  ?MCV 92.6  ?PLT 139*  ? ?Basic Metabolic Panel ?Recent Labs  ?  03/07/21 ?1500 03/08/21 ?0438  ?NA 133* 136  ?K 4.3 3.3*  ?CL 102 98  ?CO2 20* 29  ?GLUCOSE 124* 106*  ?BUN 13 13  ?CREATININE  1.20* 1.30*  ?CALCIUM 9.2 8.9  ? ?Liver Function Tests ?Recent Labs  ?  03/07/21 ?1500  ?AST 127*  ?ALT 381*  ?ALKPHOS 64  ?BILITOT 1.1  ?PROT 5.7*  ?ALBUMIN 4.2  ? ?No results for input(s): LIPASE, AMYLASE in the last 72 hours. ?Cardiac Enzymes ?No results for input(s): CKTOTAL, CKMB, CKMBINDEX, TROPONINI in the last 72 hours. ? ?BNP: ?BNP (last 3 results) ?Recent Labs  ?  03/07/21 ?1500  ?BNP 2,157.9*  ? ? ?ProBNP (last 3 results) ?No results for input(s): PROBNP in the last 8760 hours. ? ? ?D-Dimer ?No results for input(s): DDIMER in the last 72 hours. ?Hemoglobin A1C ?No results for input(s): HGBA1C in the last 72 hours. ?Fasting Lipid Panel ?No results for input(s): CHOL, HDL, LDLCALC, TRIG, CHOLHDL, LDLDIRECT in the last 72 hours. ?Thyroid Function Tests ?No results for input(s): TSH, T4TOTAL, T3FREE, THYROIDAB in the last 72 hours. ? ?Invalid input(s): FREET3 ? ?Other results: ? ? ?Imaging  ? ? ?[place] EKG SITE RITE ? ?Result Date: 03/07/2021 ?If [employer] not attached, placement could not be confirmed due to current cardiac rhythm.  ? ? ?Medications:   ? ? ?Scheduled Medications: ? Chlorhexidine Gluconate Cloth  6 each Topical Daily  ? digoxin  0.125 mg Oral Daily  ? furosemide  80 mg Intravenous BID  ? levothyroxine  100 mcg Oral Q0600  ? sodium chloride flush  10-40 mL Intracatheter Q12H  ? sodium chloride flush  3 mL Intravenous Q12H  ? sulfamethoxazole-trimethoprim  1 tablet Oral Once per day on Mon Thu  ? valACYclovir  500 mg Oral BID  ? ? ?Infusions: ? sodium chloride    ? ? ?PRN Medications: ?sodium chloride, acetaminophen, ondansetron (ZOFRAN) IV, sodium chloride flush, sodium chloride flush, temazepam ? ? ? ?Patient Profile  ? ?Natalie Baker is a 66 y.o. female with a hx of palpitations thought to be PVCs, HTN, non hodgkins lymphoma treated w/ R-CHOP and CAR-T, and systolic HF with acute decompensation. ?  ? ?Assessment/Plan  ? ?1. Acute on chronic Systolic HF  ?- Echo 5/73 EF 55-60%. RV  normal ?- Echo 6/21 EF 20-25%  moderately reduced RV function with severe MR/TR ?- Cath 06/25/19 no CAD. ?- Drop in EF felt most likely from R-CHOP ?- Echo 05/04/20 w/ EF 50-55% G2DD. RV normal ?- Echo 9/22 at [facility] EF back down to 40% w/ ? LV thrombus ?- cMRI 11/22 LVEF 39%, no LV clot ?- Echo 1/23 [facility]): EF 40-45% ?- Bedside echo 03/01, EF 20% per Dr. Haroldine Laws. ?- Direct admit from clinic 03/01 with NYHA IIIb/IV symptoms, ReDs 51%. Had been off HF meds d/t low BP after recent CAR-T therapy and cytokine storm. ?- Diuresing well with IV lasix 80 mg BID. Continue.  ?- concern for low-output. Co-ox pending. Set up CVP monitoring. ?- continue digoxin 0.125 mg daily. ?- Start spiro 12.5 mg daily ?- No beta blocker with acute decompensation. ?- No SGLT2i for now. ?- Awaiting echo ?- TED hose ?- May need RHC depending on progress ?  ?2. VT + PVCs ?- Previously had Lifevest. ?- ~10 PVCs/min on tele + multiple runs NSVT on tele overnight (longest run 16 beats) ?- Supp K (3.3 today), check Mag. ?- Will discuss amio gtt vs mexiletine with Dr. Haroldine Laws  ?- Likely need LifeVest again at discharge ?  ?3. Stage IV non-Hodgkin's lymphoma, mantle cell lymphoma   ?- s/p R-CHOP w/ Neulasta. ?- Evidence of disease progression on f/u Chest CT 6/22. ?- Scheduled to have repeat bone marrow bx in 1 mo at [facility] ?- s/p CAR-T therapy. ?  ?4. ST ?- ? D/t low-output HF ?- Continue digoxin ?- Diurese. ?- No ivabradine or beta blocker  ? ? ?ADDENDUM:  ?Initial co-ox 34.4%. Will recheck STAT.  ? ?Extremities are cool. Reports some nausea and decreased appetite. ? ?Will start milrinone 0.25.  ? ?Trend lactic acid. ? ?Length of Stay: 1 ? ?FINCH, LINDSAY N, PA-C  ?03/08/2021, 7:34 AM ? ?Advanced Heart Failure Team ?Pager [phone] (M-F; 7a - 5p)  ?Please contact [facility] for night-coverage after hours (5p -7a ) and weekends on [email or web address] ?  ?Patient seen and examined with the above-signed Advanced Practice Provider and/or Housestaff. I personally  reviewed laboratory data, imaging studies and relevant notes. I independently examined the patient and formulated the important aspects of the plan. I have edited the note to reflect any of my changes or salient points. I have personally discussed the plan with the patient and/or family. ? ?Remains weak. PICC placed co-ox 34% CVP 13-15. + sob. No CP.  ? ?General:  Weak appearing. No resp difficulty ?HEENT: normal + alopecia ?Neck: supple. JVP to jaw Carotids 2+ bilat; no bruits. No lymphadenopathy or thryomegaly appreciated. ?Cor: PMI nondisplaced. Regular tachy. No rubs, gallops or murmurs. ?  Lungs: clear ?Abdomen: soft, nontender, nondistended. No hepatosplenomegaly. No bruits or masses. Good bowel sounds. ?Extremities: no cyanosis, clubbing, rash, 2-3+  edema ?Neuro: alert & orientedx3, cranial nerves grossly intact. moves all 4 extremities w/o difficulty. Affect pleasant ? 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  ? ?Titrate milrinone to get co-ox > 55%. Place TED hose. Continue IV diuresis.  ? ?Glori Bickers, MD  ?3:56 PM ? ? ?

## 2021-03-08 NOTE — Progress Notes (Signed)
?  Mobility Specialist Criteria Algorithm Info. ? ? 03/08/21 1125  ?Mobility  ?Activity Ambulated with assistance in hallway; dangled edge of bed  ?Range of Motion/Exercises Active;All extremities  ?Level of Assistance Modified independent, requires aide device or extra time  ?Assistive Device Other (Comment) ?(IV pole)  ?Distance Ambulated (ft) 100 ft  ?Activity Response Tolerated well  ? ?Patient received dangling EOB eager to participate. Ambulated in hallway mod I with steady gait. Distance limited secondary to fatigue. HR elevated peaking at 139, SaO2 >93% throughout. Tolerated without complaint or incident. Was left dangling with all needs met, call bell in reach. ? ?03/08/2021 ?11:25 AM ? ?[place] Jariah Tarkowski, CMS, BS EXP ?Acute Rehabilitation Services  ?WXIPP:[phone] ?Office: [phone] ? ?

## 2021-03-08 NOTE — Progress Notes (Signed)
?  Transition of Care (TOC) Screening Note ? ? ?Patient Details  ?Name: Natalie Baker ?Date of Birth: 12/13/1955 ? ? ?Transition of Care (TOC) CM/SW Contact:    ?Coltan Spinello, LCSW ?Phone Number: ?03/08/2021, 6:09 PM ? ? ? ?Transition of Care Department [facility]) has reviewed patient and no TOC needs have been identified at this time. We will continue to monitor patient advancement through interdisciplinary progression rounds.Patient will benefit from PT/OT consult for disposition recommendations.  If new patient transition needs arise, please place a TOC consult. ?  ?

## 2021-03-09 DIAGNOSIS — I5023 Acute on chronic systolic (congestive) heart failure: Secondary | ICD-10-CM | POA: Diagnosis not present

## 2021-03-09 LAB — COOXEMETRY PANEL
Carboxyhemoglobin: 2.2 % — ABNORMAL HIGH (ref 0.5–1.5)
Carboxyhemoglobin: 1.8 % — ABNORMAL HIGH (ref 0.5–1.5)
Methemoglobin: <0.7 % (ref 0.0–1.5)
Methemoglobin: <0.7 % (ref 0.0–1.5)
O2 Saturation: 77.9 %
O2 Saturation: 69.3 %
Total hemoglobin: 12.4 g/dL (ref 12.0–16.0)
Total hemoglobin: 13 g/dL (ref 12.0–16.0)

## 2021-03-09 LAB — BASIC METABOLIC PANEL
Anion gap: 12 (ref 5–15)
Anion gap: 12 (ref 5–15)
BUN: 12 mg/dL (ref 8–23)
BUN: 13 mg/dL (ref 8–23)
CO2: 28 mmol/L (ref 22–32)
CO2: 31 mmol/L (ref 22–32)
Calcium: 8.6 mg/dL — ABNORMAL LOW (ref 8.9–10.3)
Calcium: 9.4 mg/dL (ref 8.9–10.3)
Chloride: 93 mmol/L — ABNORMAL LOW (ref 98–111)
Chloride: 92 mmol/L — ABNORMAL LOW (ref 98–111)
Creatinine, Ser: 1.1 mg/dL — ABNORMAL HIGH (ref 0.44–1.00)
Creatinine, Ser: 1.19 mg/dL — ABNORMAL HIGH (ref 0.44–1.00)
GFR, Estimated: 56 mL/min — ABNORMAL LOW (ref >60)
GFR, Estimated: 51 mL/min — ABNORMAL LOW (ref >60)
Glucose, Bld: 92 mg/dL (ref 70–99)
Glucose, Bld: 195 mg/dL — ABNORMAL HIGH (ref 70–99)
Potassium: 3.2 mmol/L — ABNORMAL LOW (ref 3.5–5.1)
Potassium: 3.5 mmol/L (ref 3.5–5.1)
Sodium: 133 mmol/L — ABNORMAL LOW (ref 135–145)
Sodium: 135 mmol/L (ref 135–145)

## 2021-03-09 LAB — MAGNESIUM: Magnesium: 1.8 mg/dL (ref 1.7–2.4)

## 2021-03-09 MED ORDER — CALCIUM CARBONATE ANTACID 500 MG PO CHEW
1.0000 | CHEWABLE_TABLET | Freq: Three times a day (TID) | ORAL | Status: DC | PRN
  Administered 2021-03-09: 200 mg via ORAL
  Filled 2021-03-09: qty 1

## 2021-03-09 MED ORDER — POTASSIUM CHLORIDE CRYS ER 20 MEQ PO TBCR
60.0000 meq | EXTENDED_RELEASE_TABLET | Freq: Two times a day (BID) | ORAL | Status: DC
  Administered 2021-03-09 – 2021-03-11 (×5): 60 meq via ORAL
  Filled 2021-03-09 (×5): qty 3

## 2021-03-09 MED ORDER — MIDODRINE HCL 5 MG PO TABS
5.0000 mg | ORAL_TABLET | Freq: Three times a day (TID) | ORAL | Status: DC
Start: 2021-03-09 — End: 2021-03-13
  Administered 2021-03-09 – 2021-03-13 (×12): 5 mg via ORAL
  Filled 2021-03-09 (×12): qty 1

## 2021-03-09 MED ORDER — POTASSIUM CHLORIDE CRYS ER 20 MEQ PO TBCR: 40.0000 meq | EXTENDED_RELEASE_TABLET | Freq: Two times a day (BID) | ORAL | Status: DC

## 2021-03-09 MED ORDER — MAGNESIUM SULFATE 2 GM/50ML IV SOLN
2.0000 g | Freq: Once | INTRAVENOUS | Status: AC
  Administered 2021-03-09: 2 g via INTRAVENOUS
  Filled 2021-03-09: qty 50

## 2021-03-09 NOTE — Evaluation (Signed)
Physical Therapy Evaluation ?Patient Details ?Name: Natalie Baker ?MRN: [id number] ?DOB: 01/30/1955 ?Today's Date: 03/09/2021 ? ?History of Present Illness ? Patient is a 66 y/o female who presents on 3/1 from HF clinic due to SOB and swelling, admitted with acute on chronic CHF. PMH includes obesity, HTN, non hodgkins lymphoma treated w/ R-CHOP and systolic HF.  ?Clinical Impression ? Patient reports being independent at home and lives with spouse PTA. Pt presents with tachycardia, dyspnea on exertion and decreased endurance s/p above. Tolerated transfers and gait training with supervision-Mod I for safety. Requires 1 standing rest break and use of rail at times for support. Pt reports feeling good today and close to functional baseline. HR ranging from 127-148 bpm during session. Encouraged increasing activity and walking at least 3 times daily with mobility tech and RN. Pt does not require skilled therapy services as pt functioning close to baseline and all education completed. Discharge from therapy. ?   ? ?Recommendations for follow up therapy are one component of a multi-disciplinary discharge planning process, led by the attending physician.  Recommendations may be updated based on patient status, additional functional criteria and insurance authorization. ? ?Follow Up Recommendations No PT follow up ? ?  ?Assistance Recommended at Discharge PRN  ?Patient can return home with the following ? Assist for transportation;Help with stairs or ramp for entrance;Assistance with cooking/housework ? ?  ?Equipment Recommendations None recommended by PT  ?Recommendations for Other Services ?    ?  ?Functional Status Assessment Patient has not had a recent decline in their functional status  ? ?  ?Precautions / Restrictions Precautions ?Precautions: Other (comment) ?Precaution Comments: watch HR/BP ?Restrictions ?Weight Bearing Restrictions: No  ? ?  ? ?Mobility ? Bed Mobility ?Overal bed mobility: Modified Independent ?   ?  ?  ?  ?  ?  ?General bed mobility comments: No use of rails, no assist. ?  ? ?Transfers ?Overall transfer level: Modified independent ?Equipment used: None ?  ?  ?  ?  ?  ?  ?  ?General transfer comment: Stood from EOB x1, no difficulties slightly guarded. ?  ? ?Ambulation/Gait ?Ambulation/Gait assistance: Supervision ?Gait Distance (Feet): 250 Feet ?Assistive device: None ?Gait Pattern/deviations: Step-through pattern, Decreased stride length, Trunk flexed ?  ?Gait velocity interpretation: <1.31 ft/sec, indicative of household ambulator ?  ?General Gait Details: Slow, steady gait reaching for hand rail at times for support, 2/4 DOE. 1 standing rest breaks. HR tachycardic 130-148 bpm with activity. ? ?Stairs ?  ?  ?  ?  ?  ? ?Wheelchair Mobility ?  ? ?Modified Rankin (Stroke Patients Only) ?  ? ?  ? ?Balance Overall balance assessment: Needs assistance ?Sitting-balance support: Feet supported, No upper extremity supported ?Sitting balance-Leahy Scale: Good ?Sitting balance - Comments: Able to donn sock without difficulty. ?  ?Standing balance support: During functional activity ?Standing balance-Leahy Scale: Fair ?  ?  ?  ?  ?  ?  ?  ?  ?  ?  ?  ?  ?   ? ? ? ?Pertinent Vitals/Pain Pain Assessment ?Pain Assessment: No/denies pain  ? ? ?Home Living Family/patient expects to be discharged to:: Private residence ?Living Arrangements: Spouse/significant other ?Available Help at Discharge: Family;Available PRN/intermittently ?Type of Home: House ?Home Access: Level entry ?  ?  ?  ?Home Layout: One level ?Home Equipment: Shower seat ?   ?  ?Prior Function Prior Level of Function : Independent/Modified Independent ?  ?  ?  ?  ?  ?  ?  Mobility Comments: Independent, assist needed for IADLs, does not drive. Spouse is gone for a ltitle bit 3 days/week but otherwise home and jhas beend oing most IADLs. ?ADLs Comments: independent, ?  ? ? ?Hand Dominance  ?   ? ?  ?Extremity/Trunk Assessment  ? Upper Extremity  Assessment ?Upper Extremity Assessment: Defer to OT evaluation ?  ? ?Lower Extremity Assessment ?Lower Extremity Assessment:  (Swelling in BLEs) ?  ? ?Cervical / Trunk Assessment ?Cervical / Trunk Assessment: Normal  ?Communication  ? Communication: No difficulties  ?Cognition Arousal/Alertness: Awake/alert ?Behavior During Therapy: [facility] for tasks assessed/performed ?Overall Cognitive Status: Within Functional Limits for tasks assessed ?  ?  ?  ?  ?  ?  ?  ?  ?  ?  ?  ?  ?  ?  ?  ?  ?  ?  ?  ? ?  ?General Comments General comments (skin integrity, edema, etc.): HR ranging from 127-148 bpm during session. ? ?  ?Exercises    ? ?Assessment/Plan  ?  ?PT Assessment Patient does not need any further PT services  ?PT Problem List   ? ?   ?  ?PT Treatment Interventions     ? ?PT Goals (Current goals can be found in the Care Plan section)  ?Acute Rehab PT Goals ?Patient Stated Goal: to get better and go home ?PT Goal Formulation: All assessment and education complete, DC therapy ? ?  ?Frequency   ?  ? ? ?Co-evaluation   ?  ?  ?  ?  ? ? ?  ?AM-PAC PT "6 Clicks" Mobility  ?Outcome Measure Help needed turning from your back to your side while in a flat bed without using bedrails?: None ?Help needed moving from lying on your back to sitting on the side of a flat bed without using bedrails?: None ?Help needed moving to and from a bed to a chair (including a wheelchair)?: A Little ?Help needed standing up from a chair using your arms (e.g., wheelchair or bedside chair)?: A Little ?Help needed to walk in hospital room?: A Little ?Help needed climbing 3-5 steps with a railing? : A Little ?6 Click Score: 20 ? ?  ?End of Session   ?Activity Tolerance: Patient tolerated treatment well;Treatment limited secondary to medical complications (Comment) (tachycardia) ?Patient left: in bed;with call bell/phone within reach ?Nurse Communication: Mobility status ?PT Visit Diagnosis: Difficulty in walking, not elsewhere classified (R26.2) ?   ? ?Time: 2993-7169 ?PT Time Calculation (min) (ACUTE ONLY): 21 min ? ? ?Charges:   PT Evaluation ?$PT Eval Moderate Complexity: 1 Mod ?  ?  ?   ? ? ?Marisa Severin, PT, DPT ?Acute Rehabilitation Services ?Pager [phone] ?Office [phone] ? ? ? ? ?[place] ?03/09/2021, 12:51 PM ? ?

## 2021-03-09 NOTE — Plan of Care (Signed)
?  Problem: Coping: ?Goal: Level of anxiety will decrease ?Outcome: Progressing ?  ?Problem: Skin Integrity: ?Goal: Risk for impaired skin integrity will decrease ?Outcome: Progressing ?  ?Problem: Activity: ?Goal: Capacity to carry out activities will improve ?Outcome: Progressing ?  ?

## 2021-03-09 NOTE — TOC Initial Note (Addendum)
Transition of Care (TOC) - Initial/Assessment Note  ? ? ?Patient Details  ?Name: Natalie Baker ?MRN: [id number] ?Date of Birth: 10/20/1955 ? ?Transition of Care [facility]) CM/SW Contact:    ?Natalie Baker Natalie Alberts, RN ?Phone Number: [phone] ?03/09/2021, 11:38 AM ? ?Clinical Narrative:                 ?HF TOC CM spoke to pt at bedside. States she is independent at home. Lives at home with husband. Discussed Life Vest and state she had device in the past. Contacted [employer] rep, Natalie Baker. Waiting for order.  ? ?Expected Discharge Plan: Home/Self Care ?Barriers to Discharge: Continued Medical Work up ? ? ?Patient Goals and CMS Choice ?Patient states their goals for this hospitalization and ongoing recovery are:: wants to remain independent ?CMS Medicare.gov Compare Post Acute Care list provided to:: Patient ?  ? ?Expected Discharge Plan and Services ?Expected Discharge Plan: Home/Self Care ?  ?Discharge Planning Services: CM Consult ?  ?  ?                ?  ?  ?  ?  ?  ?  ?  ?  ?  ?  ? ?Prior Living Arrangements/Services ?  ?Lives with:: Spouse ?Patient language and need for interpreter reviewed:: Yes ?Do you feel safe going back to the place where you live?: Yes      ?Need for Family Participation in Patient Care: No (Comment) ?Care giver support system in place?: No (comment) ?  ?Criminal Activity/Legal Involvement Pertinent to Current Situation/Hospitalization: No - Comment as needed ? ?Activities of Daily Living ?Home Assistive Devices/Equipment: None ?ADL Screening (condition at time of admission) ?Patient's cognitive ability adequate to safely complete daily activities?: Yes ?Is the patient deaf or have difficulty hearing?: No ?Does the patient have difficulty seeing, even when wearing glasses/contacts?: No ?Does the patient have difficulty concentrating, remembering, or making decisions?: No ?Patient able to express need for assistance with ADLs?: Yes ?Does the patient have difficulty dressing or bathing?:  No ?Independently performs ADLs?: Yes (appropriate for developmental age) ?Does the patient have difficulty walking or climbing stairs?: No ?Weakness of Legs: None ?Weakness of Arms/Hands: None ? ?Permission Sought/Granted ?Permission sought to share information with : Case Manager, Family Supports, PCP ?Permission granted to share information with : Yes, Verbal Permission Granted ? Share Information with NAME: Natalie Baker ? Permission granted to share info w AGENCY: [employer] ? Permission granted to share info w Relationship: husband ? Permission granted to share info w Contact Information: [phone] ? ?Emotional Assessment ?Appearance:: Appears stated age ?Attitude/Demeanor/Rapport: Engaged ?Affect (typically observed): Accepting ?Orientation: : Oriented to Self, Oriented to Place, Oriented to  Time, Oriented to Situation ?  ?Psych Involvement: No (comment) ? ?Admission diagnosis:  Acute on chronic systolic (congestive) heart failure ([place]) [I50.23] ?Patient Active Problem List  ? Diagnosis Date Noted  ? Acute on chronic systolic (congestive) heart failure ([place]) 03/07/2021  ? B12 deficiency 12/08/2020  ? Gallstones 08/17/2020  ? Systolic CHF ([place])   ? Acute cholecystitis   ? Tachycardia 06/21/2019  ? Port-A-Cath in place 02/03/2019  ? Encounter for antineoplastic chemotherapy 01/20/2019  ? Mantle cell lymphoma ([place]) 01/12/2019  ? Goals of care, counseling/discussion 01/12/2019  ? Aortic atherosclerosis ([place]) 12/22/2018  ? Prediabetes 07/26/2015  ? 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 Morbid obesity ([place]) BMI 35+ with prediabetes, hyperlipidemia, htn   ? Osteoporosis   ? Hypothyroidism 12/25/2012  ? Hyperlipidemia   ? Vitamin D deficiency   ? ?  PCP:  Natalie Pinto, MD ?Pharmacy:   ?[facility] ?[address] ?[place] [address] ?Phone: [phone] Fax: [phone] ? ?[id number] - [place], [place] - [address] ?[address] ?[place]  [address] ?Phone: [phone] Fax: [phone] ? ? ? ? ?Social Determinants of Health (SDOH) Interventions ?  ? ?Readmission Risk Interventions ?No flowsheet data found. ? ? ?

## 2021-03-09 NOTE — Progress Notes (Addendum)
Advanced Heart Failure Rounding Note  PCP-Cardiologist: Elouise Munroe, MD   Subjective:    03/01: Direct admit from clinic with a/c CHF 03/02: Initial co-ox 34% >>started milrinone 0.25  ? Co-ox 78% this am. Recheck STAT.  On IV lasix 80 mg BID. 3.7L UOP charted yesterday. Weight down another 6 lb. CVP 9.  Scr stable, 1.1. K 3.2. Mag 1.8.  Feeling better, breathing is less labored. Appetite about the same.  Sinus tach 120s-130s + frequent runs nonsustained VT (longest 26 beats this afternoon)  SBP intermittently soft upper 80s-90s, improves to low 100s with any movement. No dizziness.   Objective:   Weight Range: 82.3 kg Body mass index is 33.19 kg/m.   Vital Signs:   Temp:  [95 F (35 C)-97.9 F (36.6 C)] 97.8 F (36.6 C) (03/03 0719) Pulse Rate:  [126-135] 126 (03/03 0335) Resp:  [14-21] 17 (03/03 0719) BP: (88-116)/(42-78) 88/52 (03/03 0719) SpO2:  [91 %-97 %] 97 % (03/03 0719) Weight:  [82.3 kg-85.1 kg] 82.3 kg (03/03 0605)    Weight change: Filed Weights   03/08/21 0400 03/08/21 0800 03/09/21 0605  Weight: 85.1 kg 85.1 kg 82.3 kg    Intake/Output:   Intake/Output Summary (Last 24 hours) at 03/09/2021 0740 Last data filed at 03/09/2021 0400 Gross per 24 hour  Intake 933.53 ml  Output 2900 ml  Net -1966.47 ml      Physical Exam    General:  No distress. Sitting up in bed. HEENT: + alopecia Neck: supple. JVP 8-10. Carotids 2+ bilat; no bruits. Cor: PMI nondisplaced. Regular rhythm, tachy. No rubs, gallops or murmurs. Lungs: clear Abdomen: soft, nontender, nondistended.  Extremities: no cyanosis, clubbing, rash, 2+ edema Neuro: alert & orientedx3, cranial nerves grossly intact. moves all 4 extremities w/o difficulty. Affect pleasant    Telemetry   Sinus tach 120s-130s, runs NSVT (longest 26 beats this afternoon), ~ 5-10 PVCs/min   Labs    CBC Recent Labs    03/07/21 1500  WBC 4.5  HGB 13.6  HCT 41.4  MCV 92.6  PLT 139*    Basic Metabolic Panel Recent Labs    03/08/21 0438 03/09/21 0041  NA 136 133*  K 3.3* 3.2*  CL 98 93*  CO2 29 28  GLUCOSE 106* 92  BUN 13 12  CREATININE 1.30* 1.10*  CALCIUM 8.9 8.6*  MG 1.6* 1.8   Liver Function Tests Recent Labs    03/07/21 1500  AST 127*  ALT 381*  ALKPHOS 64  BILITOT 1.1  PROT 5.7*  ALBUMIN 4.2   No results for input(s): LIPASE, AMYLASE in the last 72 hours. Cardiac Enzymes No results for input(s): CKTOTAL, CKMB, CKMBINDEX, TROPONINI in the last 72 hours.  BNP: BNP (last 3 results) Recent Labs    03/07/21 1500  BNP 2,157.9*    ProBNP (last 3 results) No results for input(s): PROBNP in the last 8760 hours.   D-Dimer No results for input(s): DDIMER in the last 72 hours. Hemoglobin A1C No results for input(s): HGBA1C in the last 72 hours. Fasting Lipid Panel No results for input(s): CHOL, HDL, LDLCALC, TRIG, CHOLHDL, LDLDIRECT in the last 72 hours. Thyroid Function Tests No results for input(s): TSH, T4TOTAL, T3FREE, THYROIDAB in the last 72 hours.  Invalid input(s): FREET3  Other results:   Imaging    ECHOCARDIOGRAM COMPLETE  Result Date: 03/08/2021    ECHOCARDIOGRAM REPORT   Patient Name:   Natalie Baker Date of Exam: 03/08/2021 Medical Rec #:  [id number]        Height:       62.0 in Accession #:    [id number]       Weight:       187.6 lb Date of Birth:  08/20/1955       BSA:          1.860 m Patient Age:    66 years         BP:           132/85 mmHg Patient Gender: F                HR:           124 bpm. Exam Location:  Inpatient Procedure: 2D Echo, Cardiac Doppler, Color Doppler and Intracardiac            Opacification Agent Indications:    CHF  History:        Patient has prior history of Echocardiogram examinations, most                 recent 05/04/2020. CHF; Risk Factors:Dyslipidemia.  Sonographer:    Luisa Hart RDCS Referring Phys: Lely  Sonographer Comments: Technically difficult study due to poor echo  windows. IMPRESSIONS  1. Left ventricular ejection fraction, by estimation, is 20 to 25%. The left ventricle has severely decreased function. The left ventricle demonstrates global hypokinesis. Indeterminate diastolic filling due to E-A fusion. LV filling pressure is elevated.  2. Right ventricular systolic function is mildly reduced. The right ventricular size is normal. There is mildly elevated pulmonary artery systolic pressure. The estimated right ventricular systolic pressure is 81.4 mmHg.  3. A small pericardial effusion is present. The pericardial effusion is posterior to the left ventricle. There is no evidence of cardiac tamponade. Bilateral pleural effusions are noted.  4. The mitral valve was not well visualized. Trivial mitral valve regurgitation.  5. The aortic valve was not well visualized. Aortic valve regurgitation is not visualized.  6. The inferior vena cava is normal in size with <50% respiratory variability, suggesting right atrial pressure of 8 mmHg. Comparison(s): Changes from prior study are noted. 05/04/2020: LVEF 50-55%, no pericardial effusion. FINDINGS  Left Ventricle: Left ventricular ejection fraction, by estimation, is 20 to 25%. The left ventricle has severely decreased function. The left ventricle demonstrates global hypokinesis. Definity contrast agent was given IV to delineate the left ventricular endocardial borders. The left ventricular internal cavity size was normal in size. There is no left ventricular hypertrophy. Indeterminate diastolic filling due to E-A fusion. Elevated left ventricular end-diastolic pressure. Right Ventricle: The right ventricular size is normal. No increase in right ventricular wall thickness. Right ventricular systolic function is mildly reduced. There is mildly elevated pulmonary artery systolic pressure. The tricuspid regurgitant velocity  is 3.00 m/s, and with an assumed right atrial pressure of 8 mmHg, the estimated right ventricular systolic pressure  is 48.1 mmHg. Left Atrium: Left atrial size was normal in size. Right Atrium: Right atrial size was normal in size. Pericardium: A small pericardial effusion is present. The pericardial effusion is posterior to the left ventricle. There is no evidence of cardiac tamponade. Mitral Valve: The mitral valve was not well visualized. Trivial mitral valve regurgitation. Tricuspid Valve: The tricuspid valve is grossly normal. Tricuspid valve regurgitation is mild. Aortic Valve: The aortic valve was not well visualized. Aortic valve regurgitation is not visualized. Aortic valve mean gradient measures 3.0 mmHg. Aortic valve peak gradient measures 4.7 mmHg. Aortic valve  area, by VTI measures 1.85 cm. Pulmonic Valve: The pulmonic valve was grossly normal. Pulmonic valve regurgitation is trivial. Aorta: The aortic root and ascending aorta are structurally normal, with no evidence of dilitation. Venous: The inferior vena cava is normal in size with less than 50% respiratory variability, suggesting right atrial pressure of 8 mmHg. IAS/Shunts: No atrial level shunt detected by color flow Doppler. Additional Comments: There is pleural effusion in both left and right lateral regions.  LEFT VENTRICLE PLAX 2D LVIDd:         4.90 cm   Diastology LVIDs:         3.90 cm   LV e' medial: 2.62 cm/s LV PW:         0.80 cm LV IVS:        0.70 cm LVOT diam:     1.80 cm LV SV:         30 LV SV Index:   16 LVOT Area:     2.54 cm  RIGHT VENTRICLE RV Basal diam:  2.30 cm RV Mid diam:    1.90 cm RV S prime:     8.92 cm/s TAPSE (M-mode): 1.6 cm LEFT ATRIUM             Index        RIGHT ATRIUM           Index LA diam:        3.70 cm 1.99 cm/m   RA Area:     17.70 cm LA Vol (A2C):   72.2 ml 38.81 ml/m  RA Volume:   44.00 ml  23.65 ml/m LA Vol (A4C):   50.2 ml 26.98 ml/m LA Biplane Vol: 60.1 ml 32.30 ml/m  AORTIC VALVE                    PULMONIC VALVE AV Area (Vmax):    2.02 cm     PV Vmax:          0.68 m/s AV Area (Vmean):   2.12 cm     PV  Vmean:         50.800 cm/s AV Area (VTI):     1.85 cm     PV VTI:           0.083 m AV Vmax:           108.00 cm/s  PV Peak grad:     1.8 mmHg AV Vmean:          77.900 cm/s  PV Mean grad:     1.0 mmHg AV VTI:            0.162 m      PR End Diast Vel: 6.76 msec AV Peak Grad:      4.7 mmHg AV Mean Grad:      3.0 mmHg LVOT Vmax:         85.80 cm/s LVOT Vmean:        65.000 cm/s LVOT VTI:          0.118 m LVOT/AV VTI ratio: 0.73  AORTA Ao Root diam: 2.50 cm Ao Asc diam:  2.80 cm MR Peak grad: 72.2 mmHg   TRICUSPID VALVE MR Mean grad: 41.0 mmHg   TR Peak grad:   36.0 mmHg MR Vmax:      425.00 cm/s TR Vmax:        300.00 cm/s MR Vmean:     296.0 cm/s  SHUNTS                           Systemic VTI:  0.12 m                           Systemic Diam: 1.80 cm Lyman Bishop MD Electronically signed by Lyman Bishop MD Signature Date/Time: 03/08/2021/1:34:38 PM    Final      Medications:     Scheduled Medications:  Chlorhexidine Gluconate Cloth  6 each Topical Daily   digoxin  0.125 mg Oral Daily   enoxaparin (LOVENOX) injection  40 mg Subcutaneous Q24H   furosemide  80 mg Intravenous BID   levothyroxine  100 mcg Oral Q0600   potassium chloride  40 mEq Oral BID   sodium chloride flush  10-40 mL Intracatheter Q12H   sodium chloride flush  3 mL Intravenous Q12H   spironolactone  12.5 mg Oral Daily   [START ON 03/10/2021] sulfamethoxazole-trimethoprim  1 tablet Oral Once per day on Sun Sat   valACYclovir  500 mg Oral BID    Infusions:  sodium chloride     magnesium sulfate bolus IVPB     milrinone 0.25 mcg/kg/min (03/09/21 0230)    PRN Medications: sodium chloride, acetaminophen, ondansetron (ZOFRAN) IV, sodium chloride flush, sodium chloride flush, temazepam    Patient Profile   Natalie Baker is a 66 y.o. 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 Admitted with a/c  systolic CHF with low-output.  Assessment/Plan   1. Acute on chronic Systolic HF  - Echo 6/25 EF 55-60%. RV normal - Echo 6/21 EF 20-25%  moderately reduced RV function with severe MR/TR - Cath 06/25/19 no CAD. - Drop in EF felt most likely from R-CHOP - Echo 05/04/20 w/ EF 50-55% G2DD. RV normal - Echo 9/22 at [facility] EF back down to 40% w/ ? LV thrombus - cMRI 11/22 LVEF 39%, no LV clot - Echo 1/23 [facility]): EF 40-45% - Echo 03/02: EF 20-25%, RV mildly reduced, RVSP 44 mmHg - Direct admit from clinic 03/01 with NYHA IIIb/IV symptoms, ReDs 51%. Had been off HF meds d/t low BP after recent CAR-T therapy and cytokine storm. 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 - Co-ox 78% on milrinone 0.25. Recheck.  - Diuresing well with IV lasix 80 mg BID. Continue. CVP 9. - continue digoxin 0.125 mg daily. - continue spiro 12.5 mg daily - BP intermittently soft. Add midodrine 5 mg TID. Need to monitor closely. Discussed with RN at bedside. - No BP room to titrate GDMT - No beta blocker with acute decompensation. - No SGLT2i for now. - Continue TED hose - May need RHC depending on progress   2. VT + PVCs - Previously had Lifevest. - ~5-10 PVCs/min + multiple runs NSVT (longest 26 beats this afternoon) on tele - Supp K (3.2) and Mag (1.8) today - Start po amio 200 mg BID - Consider LifeVest if recurrent NSVT once off milrinone and lytes corrected   3. Stage IV non-Hodgkin's lymphoma, mantle cell lymphoma   - s/p R-CHOP w/ Neulasta. - Evidence of disease progression on f/u Chest CT 6/22. - Scheduled to have repeat bone marrow bx in 1 mo at [facility] - s/p CAR-T therapy.   4. ST - ? D/t low-output HF - Continue digoxin - Diurese. - No beta blocker -  Starting [facility] as above which will likely lower rate  5. Hypokalemia/hypomagnesemia - Supp to keep K> 4.0 Mg > 2.0    Length of Stay: 2  FINCH, LINDSAY N, PA-C  03/09/2021, 7:40 AM  Advanced Heart Failure Team Pager [phone] (M-F; 7a - 5p)  Please contact [facility] for night-coverage after hours (5p -7a ) and weekends on [email or web address]   Patient seen and examined with the above-signed Advanced Practice Provider and/or Housestaff. I personally reviewed laboratory data, imaging studies and relevant notes. I independently examined the patient and formulated the important aspects of the plan. I have edited the note to reflect any of my changes or salient points. I have personally discussed the plan with the patient and/or family.  Coox improved with milrinone. Diuresing well with IV lasix. CVP 15 -> 9. Remains very tachy. Continues with runs of NSVT. SOB improving.   General:  Sitting up in bed. No resp difficulty HEENT: normal + alopecia Neck: supple. JVP 9 Carotids 2+ bilat; no bruits. No lymphadenopathy or thryomegaly appreciated. Cor: PMI nondisplaced. Regular tachy  No rubs, gallops or murmurs. Lungs: clear Abdomen: obese  soft, nontender, nondistended. No hepatosplenomegaly. No bruits or masses. Good bowel sounds. Extremities: no cyanosis, clubbing, rash, 1+ edema Neuro: alert & orientedx3, cranial nerves grossly intact. moves all 4 extremities w/o difficulty. Affect pleasant  Remains very tenuous. Continue milrinone. Continue IV diuresis for 1 more day. Plan was to add back ivabradine but given NSVT with start amio 200 bid. First. Titrate GDMT as tolerated. Will need slow wean of milrinone.  QRS voltage very low. No effusion on echo. Is it worth repeating cMRI?  Glori Bickers, MD  4:27 PM

## 2021-03-09 NOTE — Care Management Important Message (Signed)
Important Message ? ?Patient Details  ?Name: Natalie Baker ?MRN: [id number] ?Date of Birth: Oct 31, 1955 ? ? ?Medicare Important Message Given:  Yes ? ? ? ? ?Jamisen Duerson ?03/09/2021, 2:09 PM ?

## 2021-03-10 DIAGNOSIS — I5023 Acute on chronic systolic (congestive) heart failure: Secondary | ICD-10-CM | POA: Diagnosis not present

## 2021-03-10 LAB — BASIC METABOLIC PANEL
Anion gap: 13 (ref 5–15)
BUN: 11 mg/dL (ref 8–23)
CO2: 30 mmol/L (ref 22–32)
Calcium: 9 mg/dL (ref 8.9–10.3)
Chloride: 91 mmol/L — ABNORMAL LOW (ref 98–111)
Creatinine, Ser: 1.08 mg/dL — ABNORMAL HIGH (ref 0.44–1.00)
GFR, Estimated: 57 mL/min — ABNORMAL LOW (ref >60)
Glucose, Bld: 126 mg/dL — ABNORMAL HIGH (ref 70–99)
Potassium: 3.6 mmol/L (ref 3.5–5.1)
Sodium: 134 mmol/L — ABNORMAL LOW (ref 135–145)

## 2021-03-10 LAB — CBC
HCT: 37.4 % (ref 36.0–46.0)
Hemoglobin: 12.5 g/dL (ref 12.0–15.0)
MCH: 31.3 pg (ref 26.0–34.0)
MCHC: 33.4 g/dL (ref 30.0–36.0)
MCV: 93.7 fL (ref 80.0–100.0)
Platelets: 87 10*3/uL — ABNORMAL LOW (ref 150–400)
RBC: 3.99 MIL/uL (ref 3.87–5.11)
WBC: 2.7 10*3/uL — ABNORMAL LOW (ref 4.0–10.5)
nRBC: 0 % (ref 0.0–0.2)

## 2021-03-10 LAB — COOXEMETRY PANEL
Carboxyhemoglobin: 1.3 % (ref 0.5–1.5)
Methemoglobin: 2 % — ABNORMAL HIGH (ref 0.0–1.5)
O2 Saturation: 66.5 %
Total hemoglobin: 13.1 g/dL (ref 12.0–16.0)

## 2021-03-10 LAB — TSH: TSH: 0.792 u[IU]/mL (ref 0.350–4.500)

## 2021-03-10 LAB — MAGNESIUM: Magnesium: 1.7 mg/dL (ref 1.7–2.4)

## 2021-03-10 MED ORDER — MAGNESIUM SULFATE 2 GM/50ML IV SOLN
2.0000 g | Freq: Once | INTRAVENOUS | Status: AC
  Administered 2021-03-10: 2 g via INTRAVENOUS
  Filled 2021-03-10: qty 50

## 2021-03-10 MED ORDER — AMIODARONE HCL 200 MG PO TABS
200.0000 mg | ORAL_TABLET | Freq: Two times a day (BID) | ORAL | Status: DC
  Administered 2021-03-10 – 2021-03-19 (×20): 200 mg via ORAL
  Filled 2021-03-10 (×20): qty 1

## 2021-03-10 MED ORDER — IVABRADINE HCL 5 MG PO TABS
5.0000 mg | ORAL_TABLET | Freq: Two times a day (BID) | ORAL | Status: DC
  Administered 2021-03-10 – 2021-03-14 (×9): 5 mg via ORAL
  Filled 2021-03-10 (×10): qty 1

## 2021-03-10 MED ORDER — POTASSIUM CHLORIDE CRYS ER 20 MEQ PO TBCR
40.0000 meq | EXTENDED_RELEASE_TABLET | Freq: Once | ORAL | Status: AC
  Administered 2021-03-10: 40 meq via ORAL
  Filled 2021-03-10: qty 2

## 2021-03-10 NOTE — Progress Notes (Signed)
Patient ID: Natalie Baker, female   DOB: January 05, 1956, 66 y.o.   MRN: [id number] ?  ? ? Advanced Heart Failure Rounding Note ? ?PCP-Cardiologist: Elouise Munroe, MD  ? ?Subjective:   ? ?03/01: Direct admit from clinic with a/c CHF ?03/02: Initial co-ox 34% >>started milrinone 0.25 ? ?Co-ox 67% today, good diuresis with weight down and CVP 5 today.  Creatinine stable 1.08.  ? ?Sinus tachy around 120, still with NSVT runs.  ? ?SBP 90s, denies dizziness.  She is on midodrine 5 mg tid.  ? ? ?Objective:   ?Weight Range: ?81.8 kg ?Body mass index is 32.98 kg/m?.  ? ?Vital Signs:   ?Temp:  [97.5 ?F (36.4 ?C)-98.5 ?F (36.9 ?C)] 98.5 ?F (36.9 ?C) (03/04 8916) ?Pulse Rate:  [98-144] 144 (03/04 0827) ?Resp:  [14-20] 14 (03/04 9450) ?BP: (91-110)/(46-65) 99/58 (03/04 0748) ?SpO2:  [94 %-96 %] 96 % (03/04 0748) ?Weight:  [81.8 kg] 81.8 kg (03/04 0426) ?Last BM Date : 03/09/21 ? ?Weight change: ?Filed Weights  ? 03/08/21 0800 03/09/21 0605 03/10/21 0426  ?Weight: 85.1 kg 82.3 kg 81.8 kg  ? ? ?Intake/Output:  ? ?Intake/Output Summary (Last 24 hours) at 03/10/2021 1130 ?Last data filed at 03/10/2021 3888 ?Gross per 24 hour  ?Intake 712.27 ml  ?Output 3200 ml  ?Net -2487.73 ml  ?  ? ? ?Physical Exam  ?  ?General: NAD ?Neck: No JVD, no thyromegaly or thyroid nodule.  ?Lungs: Clear to auscultation bilaterally with normal respiratory effort. ?CV: Nondisplaced PMI.  Heart tachy, regular S1/S2, no S3/S4, no murmur.  No peripheral edema.   ?Abdomen: Soft, nontender, no hepatosplenomegaly, no distention.  ?Skin: Intact without lesions or rashes.  ?Neurologic: Alert and oriented x 3.  ?Psych: Normal affect. ?Extremities: No clubbing or cyanosis.  ?HEENT: Normal.  ? ?Telemetry  ? ?Sinus tachy around 120 bpm with runs of NSVT, personally reviewed.  ? ? ?Labs  ?  ?CBC ?Recent Labs  ?  03/07/21 ?1500 03/10/21 ?0248  ?WBC 4.5 2.7*  ?HGB 13.6 12.5  ?HCT 41.4 37.4  ?MCV 92.6 93.7  ?PLT 139* 87*  ? ?Basic Metabolic Panel ?Recent Labs  ?   03/09/21 ?0041 03/09/21 ?1830 03/10/21 ?0248  ?NA 133* 135 134*  ?K 3.2* 3.5 3.6  ?CL 93* 92* 91*  ?CO2 '28 31 30  '$ ?GLUCOSE 92 195* 126*  ?BUN '12 13 11  '$ ?CREATININE 1.10* 1.19* 1.08*  ?CALCIUM 8.6* 9.4 9.0  ?MG 1.8  --  1.7  ? ?Liver Function Tests ?Recent Labs  ?  03/07/21 ?1500  ?AST 127*  ?ALT 381*  ?ALKPHOS 64  ?BILITOT 1.1  ?PROT 5.7*  ?ALBUMIN 4.2  ? ?No results for input(s): LIPASE, AMYLASE in the last 72 hours. ?Cardiac Enzymes ?No results for input(s): CKTOTAL, CKMB, CKMBINDEX, TROPONINI in the last 72 hours. ? ?BNP: ?BNP (last 3 results) ?Recent Labs  ?  03/07/21 ?1500  ?BNP 2,157.9*  ? ? ?ProBNP (last 3 results) ?No results for input(s): PROBNP in the last 8760 hours. ? ? ?D-Dimer ?No results for input(s): DDIMER in the last 72 hours. ?Hemoglobin A1C ?No results for input(s): HGBA1C in the last 72 hours. ?Fasting Lipid Panel ?No results for input(s): CHOL, HDL, LDLCALC, TRIG, CHOLHDL, LDLDIRECT in the last 72 hours. ?Thyroid Function Tests ?Recent Labs  ?  03/10/21 ?0248  ?TSH 0.792  ? ? ?Other results: ? ? ?Imaging  ? ? ?No results found. ? ? ?Medications:   ? ? ?Scheduled Medications: ? amiodarone  200 mg  Oral BID  ? Chlorhexidine Gluconate Cloth  6 each Topical Daily  ? digoxin  0.125 mg Oral Daily  ? enoxaparin (LOVENOX) injection  40 mg Subcutaneous Q24H  ? ivabradine  5 mg Oral BID WC  ? levothyroxine  100 mcg Oral Q0600  ? midodrine  5 mg Oral TID WC  ? potassium chloride  60 mEq Oral BID  ? sodium chloride flush  10-40 mL Intracatheter Q12H  ? sodium chloride flush  3 mL Intravenous Q12H  ? spironolactone  12.5 mg Oral Daily  ? sulfamethoxazole-trimethoprim  1 tablet Oral Once per day on Sun Sat  ? valACYclovir  500 mg Oral BID  ? ? ?Infusions: ? sodium chloride    ? milrinone 0.25 mcg/kg/min (03/10/21 0700)  ? ? ?PRN Medications: ?sodium chloride, acetaminophen, calcium carbonate, ondansetron (ZOFRAN) IV, sodium chloride flush, sodium chloride flush, temazepam ? ? ? ?Patient Profile  ? ?Natalie Baker is a 66 y.o. 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 Admitted with a/c systolic CHF with low-output. ? ?Assessment/Plan  ? ?1. Acute on chronic Systolic HF  ?- Echo 7/41 EF 55-60%. RV normal ?- Echo 6/21 EF 20-25%  moderately reduced RV function with severe MR/TR ?- Cath 06/25/19 no CAD. ?- Drop in EF felt most likely from R-CHOP ?- Echo 05/04/20 w/ EF 50-55% G2DD. RV normal ?- Echo 9/22 at [facility] EF back down to 40% w/ ? LV thrombus ?- cMRI 11/22 LVEF 39%, no LV clot ?- Echo 1/23 [facility]): EF 40-45% ?- Echo 03/02: EF 20-25%, RV mildly reduced, RVSP 44 mmHg ?- Direct admit from clinic 03/01 with NYHA IIIb/IV symptoms, ReDs 51%. Had been off HF meds d/t low BP after recent CAR-T therapy and cytokine storm. 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 ?- Co-ox 67% on milrinone 0.25, decrease milrinone to 0.125 today.  ?- She has diuresed well on IV Lasix, CVP 5 today.  Stop IV Lasix, start po tomorrow.  ?- continue digoxin 0.125 mg daily. ?- continue spiro 12.5 mg daily ?- SBP 90s on midodrine.  ?- Will add ivabradine 5 mg tid with persistent sinus tachy.  ?- No beta blocker with acute decompensation. ?- Farxiga as next step.  ?- Continue TED hose ?  ?2. VT + PVCs ?- Previously had Lifevest. ?- Still with NSVT runs.  ?- Continue amiodarone 200 mg BID ?- Weaning milrinone.  ?- Consider LifeVest if recurrent NSVT once off milrinone and lytes corrected ?  ?3. Stage IV non-Hodgkin's lymphoma, mantle cell lymphoma   ?- s/p R-CHOP w/ Neulasta. ?- Evidence of disease progression on f/u Chest CT 6/22. ?- Scheduled to have repeat bone marrow bx in 1 mo at [facility] ?- s/p CAR-T therapy. ?  ?4. ST ?- ? D/t low-output HF but co-ox ok.  ?- Continue digoxin ?- Diurese. ?- No beta blocker ?- Now on amiodarone.  ?- Starting ivabradine 5 bid.  ? ?5. Hypokalemia/hypomagnesemia ?- Supp to keep K> 4.0 Mg > 2.0 ? ? ?Length of Stay: 3 ? ?Loralie Champagne,  MD  ?03/10/2021, 11:30 AM ? ?Advanced Heart Failure Team ?Pager [phone] (M-F; 7a - 5p)  ?Please contact [facility] for night-coverage after hours (5p -7a ) and weekends on [email or web address] ?  ?

## 2021-03-10 NOTE — Progress Notes (Signed)
Patient had appx 20 beat run VTach, Dr Shirlee Latch paged, he has given verbal order for 40 meq Potassium PO once, and 2g IV Magnesium q2 hrs x2 for a total of 4g--repeated order back and ordered per Dr Shirlee Latch verbal consent ?

## 2021-03-10 NOTE — Evaluation (Signed)
Occupational Therapy Evaluation/Discharge ?Patient Details ?Name: Natalie Baker ?MRN: [id number] ?DOB: 14-Nov-1955 ?Today's Date: 03/10/2021 ? ? ?History of Present Illness Patient is a 66 y/o female who presents on 3/1 from HF clinic due to SOB and swelling, admitted with acute on chronic CHF. PMH includes obesity, HTN, non hodgkins lymphoma treated w/ R-CHOP and systolic HF.  ? ?Clinical Impression ?  ?PTA, pt lives with spouse, typically Independent with ADLs/mobility with husband assist for IADLs (cleaning, groceries, pet mgmt). 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 Pt presents now at baseline for daily tasks. Pt received on BSC, able to complete remainder of toileting task without assist or safety concerns. Pt overall Mod I for ADLs/mobility in room. Collaborated on energy conservation strategies with pt implementing many techniques already at home. Pt denies need for further skilled OT services, will sign off at acute level. Encouraged daily mobility with nursing and mobility staff. ? ?HR 130s sustained, up to 144bpm ?BP soft but stable.  ?   ? ?Recommendations for follow up therapy are one component of a multi-disciplinary discharge planning process, led by the attending physician.  Recommendations may be updated based on patient status, additional functional criteria and insurance authorization.  ? ?Follow Up Recommendations ? No OT follow up  ?  ?Assistance Recommended at Discharge PRN  ?Patient can return home with the following Assistance with cooking/housework;Assist for transportation ? ?  ?Functional Status Assessment ? Patient has not had a recent decline in their functional status  ?Equipment Recommendations ? None recommended by OT  ?  ?Recommendations for Other Services   ? ? ?  ?Precautions / Restrictions Precautions ?Precautions: Other (comment) ?Precaution Comments: watch HR/BP ?Restrictions ?Weight Bearing Restrictions: No  ? ?  ? ?Mobility Bed Mobility ?Overal bed mobility: Modified  Independent ?  ?  ?  ?  ?  ?  ?  ?  ? ?Transfers ?Overall transfer level: Modified independent ?Equipment used: None ?  ?  ?  ?  ?  ?  ?  ?General transfer comment: transferred from [facility] back to bed without AD, light use of bedrail though likely for confidence ?  ? ?  ?Balance Overall balance assessment: Needs assistance ?Sitting-balance support: Feet supported, No upper extremity supported ?Sitting balance-Leahy Scale: Good ?  ?  ?Standing balance support: During functional activity ?Standing balance-Leahy Scale: Good ?  ?  ?  ?  ?  ?  ?  ?  ?  ?  ?  ?  ?   ? ?ADL either performed or assessed with clinical judgement  ? ?ADL Overall ADL's : Modified independent ?  ?  ?  ?  ?  ?  ?  ?  ?  ?  ?  ?  ?  ?  ?  ?  ?  ?  ?  ?General ADL Comments: received on BSC, able to manage toileting task and move BSC out of the way without assist. Managing lines, bed linens, etc. Discussed IADL routine, energy conservation with pt implementing many techniques already.  ? ? ? ?Vision Ability to See in Adequate Light: 0 Adequate ?Patient Visual Report: No change from baseline ?Vision Assessment?: No apparent visual deficits  ?   ?Perception   ?  ?Praxis   ?  ? ?Pertinent Vitals/Pain Pain Assessment ?Pain Assessment: No/denies pain  ? ? ? ?Hand Dominance Right ?  ?Extremity/Trunk Assessment Upper Extremity Assessment ?Upper Extremity Assessment: Overall WFL for tasks assessed ?  ?Lower Extremity Assessment ?Lower Extremity  Assessment: Defer to PT evaluation ?  ?Cervical / Trunk Assessment ?Cervical / Trunk Assessment: Normal ?  ?Communication Communication ?Communication: No difficulties ?  ?Cognition Arousal/Alertness: Awake/alert ?Behavior During Therapy: [facility] for tasks assessed/performed ?Overall Cognitive Status: Within Functional Limits for tasks assessed ?  ?  ?  ?  ?  ?  ?  ?  ?  ?  ?  ?  ?  ?  ?  ?  ?  ?  ?  ?General Comments  HR 130s sustained with BSC use, up to 145bpm, denies symptoms (occasional flutter), BP 90s/50s, denies  dizziness ? ?  ?Exercises   ?  ?Shoulder Instructions    ? ? ?Home Living Family/patient expects to be discharged to:: Private residence ?Living Arrangements: Spouse/significant other ?Available Help at Discharge: Family;Available PRN/intermittently ?Type of Home: House ?Home Access: Level entry ?  ?  ?Home Layout: One level ?  ?  ?Bathroom Shower/Tub: Tub/shower unit ?  ?Bathroom Toilet: Standard ?  ?  ?Home Equipment: Shower seat ?  ?  ?  ? ?  ?Prior Functioning/Environment Prior Level of Function : Independent/Modified Independent;Working/employed ?  ?  ?  ?  ?  ?  ?Mobility Comments: Independent, no AD ?ADLs Comments: assist needed for IADLs (laundry, heavy cleaning, pet mgmt), does not drive. Spouse is gone for a little bit 3 days/week but otherwise home and assisting with IADLs. Pt reports using shower chair for showers to conserve energy. works from home as an Theatre manager for [employer] company ?  ? ?  ?  ?OT Problem List: Cardiopulmonary status limiting activity;Decreased activity tolerance ?  ?   ?OT Treatment/Interventions:    ?  ?OT Goals(Current goals can be found in the care plan section) Acute Rehab OT Goals ?Patient Stated Goal: go home soon (thinking Tuesday) ?OT Goal Formulation: All assessment and education complete, DC therapy  ?OT Frequency:   ?  ? ?Co-evaluation   ?  ?  ?  ?  ? ?  ?AM-PAC OT "6 Clicks" Daily Activity     ?Outcome Measure Help from another person eating meals?: None ?Help from another person taking care of personal grooming?: None ?Help from another person toileting, which includes using toliet, bedpan, or urinal?: None ?Help from another person bathing (including washing, rinsing, drying)?: None ?Help from another person to put on and taking off regular upper body clothing?: None ?Help from another person to put on and taking off regular lower body clothing?: None ?6 Click Score: 24 ?  ?End of Session Nurse Communication: Mobility status ? ?Activity Tolerance:  Patient tolerated treatment well ?Patient left: in bed;with call bell/phone within reach;with nursing/sitter in room ? ?OT Visit Diagnosis: Other (comment) (decreased cardiopulomonary tolerance)  ?              ?Time: 4720-7218 ?OT Time Calculation (min): 13 min ?Charges:  OT General Charges ?$OT Visit: 1 Visit ?OT Evaluation ?$OT Eval Low Complexity: 1 Low ? ?Malachy Chamber, OTR/L ?Acute Rehab Services ?Office: [phone]  ? ?Natalie Baker ?03/10/2021, 8:36 AM ?

## 2021-03-11 DIAGNOSIS — I5023 Acute on chronic systolic (congestive) heart failure: Secondary | ICD-10-CM | POA: Diagnosis not present

## 2021-03-11 LAB — BASIC METABOLIC PANEL
Anion gap: 10 (ref 5–15)
BUN: 13 mg/dL (ref 8–23)
CO2: 30 mmol/L (ref 22–32)
Calcium: 9.8 mg/dL (ref 8.9–10.3)
Chloride: 95 mmol/L — ABNORMAL LOW (ref 98–111)
Creatinine, Ser: 1.29 mg/dL — ABNORMAL HIGH (ref 0.44–1.00)
GFR, Estimated: 46 mL/min — ABNORMAL LOW (ref >60)
Glucose, Bld: 144 mg/dL — ABNORMAL HIGH (ref 70–99)
Potassium: 4.9 mmol/L (ref 3.5–5.1)
Sodium: 135 mmol/L (ref 135–145)

## 2021-03-11 LAB — COOXEMETRY PANEL
Carboxyhemoglobin: 1.1 % (ref 0.5–1.5)
Carboxyhemoglobin: 1.1 % (ref 0.5–1.5)
Methemoglobin: <0.7 % (ref 0.0–1.5)
Methemoglobin: <0.7 % (ref 0.0–1.5)
O2 Saturation: 46.1 %
O2 Saturation: 42.3 %
Total hemoglobin: 14 g/dL (ref 12.0–16.0)
Total hemoglobin: 13.6 g/dL (ref 12.0–16.0)

## 2021-03-11 LAB — MAGNESIUM: Magnesium: 2.8 mg/dL — ABNORMAL HIGH (ref 1.7–2.4)

## 2021-03-11 MED ORDER — POTASSIUM CHLORIDE CRYS ER 20 MEQ PO TBCR
60.0000 meq | EXTENDED_RELEASE_TABLET | Freq: Every day | ORAL | Status: DC
  Administered 2021-03-12 – 2021-03-13 (×2): 60 meq via ORAL
  Filled 2021-03-11 (×2): qty 3

## 2021-03-11 MED ORDER — DAPAGLIFLOZIN PROPANEDIOL 10 MG PO TABS
10.0000 mg | ORAL_TABLET | Freq: Every day | ORAL | Status: DC
  Administered 2021-03-11 – 2021-03-19 (×9): 10 mg via ORAL
  Filled 2021-03-11 (×9): qty 1

## 2021-03-11 MED ORDER — FUROSEMIDE 20 MG PO TABS
20.0000 mg | ORAL_TABLET | Freq: Every day | ORAL | Status: DC
  Administered 2021-03-11: 20 mg via ORAL
  Filled 2021-03-11: qty 1

## 2021-03-11 NOTE — Progress Notes (Signed)
Patient ID: Natalie Baker, female   DOB: [birth year], 66 y.o.   MRN: [id number] ?  ? ? Advanced Heart Failure Rounding Note ? ?PCP-Cardiologist: Elouise Munroe, MD  ? ?Subjective:   ? ?03/01: Direct admit from clinic with a/c CHF ?03/02: Initial co-ox 34% >>started milrinone 0.25 ? ?HR 100s (lower) on ivabradine.  Creatinine 1.29.  Weight down about 0.5 lbs.  CVP 8 this morning.  Early am co-ox was low at 46%, she remains on milrinone 0.125.  ? ?On po amiodarone, she had a couple short (3 beats) runs NSVT overnight.  ? ?SBP 100s, denies dizziness.  She is on midodrine 5 mg tid.  ? ? ?Objective:   ?Weight Range: ?81.6 kg ?Body mass index is 32.9 kg/m?.  ? ?Vital Signs:   ?Temp:  [98.1 ?F (36.7 ?C)-98.6 ?F (37 ?C)] 98.1 ?F (36.7 ?C) (03/05 0735) ?Pulse Rate:  [112-119] 117 (03/05 0735) ?Resp:  [12-22] 19 (03/05 0735) ?BP: (87-107)/(47-90) 103/71 (03/05 0735) ?SpO2:  [94 %-97 %] 95 % (03/05 0735) ?Weight:  [81.6 kg] 81.6 kg (03/05 0406) ?Last BM Date : 03/09/21 ? ?Weight change: ?Filed Weights  ? 03/09/21 0605 03/10/21 0426 03/11/21 0406  ?Weight: 82.3 kg 81.8 kg 81.6 kg  ? ? ?Intake/Output:  ? ?Intake/Output Summary (Last 24 hours) at 03/11/2021 1112 ?Last data filed at 03/11/2021 0700 ?Gross per 24 hour  ?Intake 571.21 ml  ?Output --  ?Net 571.21 ml  ?  ? ? ?Physical Exam  ?  ?General: NAD ?Neck: JVP 8 cm, no thyromegaly or thyroid nodule.  ?Lungs: Clear to auscultation bilaterally with normal respiratory effort. ?CV: Nondisplaced PMI.  Heart regular S1/S2, no S3/S4, no murmur.  No peripheral edema.   ?Abdomen: Soft, nontender, no hepatosplenomegaly, no distention.  ?Skin: Intact without lesions or rashes.  ?Neurologic: Alert and oriented x 3.  ?Psych: Normal affect. ?Extremities: No clubbing or cyanosis.  ?HEENT: Normal.  ? ?Telemetry  ? ?Sinus tachy 100s, couple of 3 beat NSVT runs overnight, personally reviewed.  ? ? ?Labs  ?  ?CBC ?Recent Labs  ?  03/10/21 ?0248  ?WBC 2.7*  ?HGB 12.5  ?HCT 37.4  ?MCV 93.7  ?PLT  87*  ? ?Basic Metabolic Panel ?Recent Labs  ?  03/10/21 ?7096 03/11/21 ?0440  ?NA 134* 135  ?K 3.6 4.9  ?CL 91* 95*  ?CO2 30 30  ?GLUCOSE 126* 144*  ?BUN 11 13  ?CREATININE 1.08* 1.29*  ?CALCIUM 9.0 9.8  ?MG 1.7 2.8*  ? ?Liver Function Tests ?No results for input(s): AST, ALT, ALKPHOS, BILITOT, PROT, ALBUMIN in the last 72 hours. ? ?No results for input(s): LIPASE, AMYLASE in the last 72 hours. ?Cardiac Enzymes ?No results for input(s): CKTOTAL, CKMB, CKMBINDEX, TROPONINI in the last 72 hours. ? ?BNP: ?BNP (last 3 results) ?Recent Labs  ?  03/07/21 ?1500  ?BNP 2,157.9*  ? ? ?ProBNP (last 3 results) ?No results for input(s): PROBNP in the last 8760 hours. ? ? ?D-Dimer ?No results for input(s): DDIMER in the last 72 hours. ?Hemoglobin A1C ?No results for input(s): HGBA1C in the last 72 hours. ?Fasting Lipid Panel ?No results for input(s): CHOL, HDL, LDLCALC, TRIG, CHOLHDL, LDLDIRECT in the last 72 hours. ?Thyroid Function Tests ?Recent Labs  ?  03/10/21 ?0248  ?TSH 0.792  ? ? ?Other results: ? ? ?Imaging  ? ? ?No results found. ? ? ?Medications:   ? ? ?Scheduled Medications: ? amiodarone  200 mg Oral BID  ? Chlorhexidine Gluconate Cloth  6 each  Topical Daily  ? dapagliflozin propanediol  10 mg Oral Daily  ? digoxin  0.125 mg Oral Daily  ? enoxaparin (LOVENOX) injection  40 mg Subcutaneous Q24H  ? furosemide  20 mg Oral Daily  ? ivabradine  5 mg Oral BID WC  ? levothyroxine  100 mcg Oral Q0600  ? midodrine  5 mg Oral TID WC  ? potassium chloride  60 mEq Oral BID  ? sodium chloride flush  10-40 mL Intracatheter Q12H  ? sodium chloride flush  3 mL Intravenous Q12H  ? spironolactone  12.5 mg Oral Daily  ? sulfamethoxazole-trimethoprim  1 tablet Oral Once per day on Sun Sat  ? valACYclovir  500 mg Oral BID  ? ? ?Infusions: ? sodium chloride    ? milrinone 0.125 mcg/kg/min (03/11/21 0700)  ? ? ?PRN Medications: ?sodium chloride, acetaminophen, calcium carbonate, ondansetron (ZOFRAN) IV, sodium chloride flush, sodium  chloride flush, temazepam ? ? ? ?Patient Profile  ? ?Natalie Baker is a 66 y.o. 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 Admitted with a/c systolic CHF with low-output. ? ?Assessment/Plan  ? ?1. Acute on chronic Systolic HF  ?- Echo 9/50 EF 55-60%. RV normal ?- Echo 6/21 EF 20-25%  moderately reduced RV function with severe MR/TR ?- Cath 06/25/19 no CAD. ?- Drop in EF felt most likely from R-CHOP ?- Echo 05/04/20 w/ EF 50-55% G2DD. RV normal ?- Echo 9/22 at [facility] EF back down to 40% w/ ? LV thrombus ?- cMRI 11/22 LVEF 39%, no LV clot ?- Echo 1/23 [facility]): EF 40-45% ?- Echo 03/02: EF 20-25%, RV mildly reduced, RVSP 44 mmHg ?- Direct admit from clinic 03/01 with NYHA IIIb/IV symptoms, ReDs 51%. Had been off HF meds d/t low BP after recent CAR-T therapy and cytokine storm. 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 ?- Early am co-ox 46% on milrinone 0.125, will resend this morning but keep milrinone at 0.125 today.  Try again to wean tomorrow.    ?- continue digoxin 0.125 mg daily. ?- continue spiro 12.5 mg daily ?- SBP 100s on midodrine.  ?- Continue ivabradine 5 mg tid with persistent sinus tachy, HR lower today.  ?- No beta blocker with acute decompensation. ?- CVP 8 today, stable creatinine.  Add Farxiga 10 mg daily and Lasix 20 mg po daily today.  ?- Continue TED hose ?  ?2. VT + PVCs ?- Previously had Lifevest. ?- Still with NSVT runs.  ?- Continue amiodarone 200 mg BID ?- Weaning milrinone.  ?- Consider LifeVest if recurrent NSVT once off milrinone and lytes corrected ?  ?3. Stage IV non-Hodgkin's lymphoma, mantle cell lymphoma   ?- s/p R-CHOP w/ Neulasta. ?- Evidence of disease progression on f/u Chest CT 6/22. ?- Scheduled to have repeat bone marrow bx in 1 mo at [facility] ?- s/p CAR-T therapy. ?  ?4. ST ?- ? D/t low-output HF but co-ox ok.  ?- Continue digoxin ?- Diurese. ?- No beta blocker ?- Now on amiodarone.  ?- HR lower  on ivabradine 5 bid.  ? ?5. Hypokalemia/hypomagnesemia ?- Supp to keep K> 4.0 Mg > 2.0 ? ? ?Length of Stay: 4 ? ?Loralie Champagne, MD  ?03/11/2021, 11:12 AM ? ?Advanced Heart Failure Team ?Pager [phone] (M-F; 7a - 5p)  ?Please contact [facility] for night-coverage after hours (5p -7a ) and weekends on [email or web address] ?  ?

## 2021-03-12 ENCOUNTER — Other Ambulatory Visit (HOSPITAL_COMMUNITY): Payer: Self-pay

## 2021-03-12 DIAGNOSIS — I5023 Acute on chronic systolic (congestive) heart failure: Secondary | ICD-10-CM | POA: Diagnosis not present

## 2021-03-12 LAB — BASIC METABOLIC PANEL
Anion gap: 10 (ref 5–15)
BUN: 13 mg/dL (ref 8–23)
CO2: 27 mmol/L (ref 22–32)
Calcium: 9.4 mg/dL (ref 8.9–10.3)
Chloride: 99 mmol/L (ref 98–111)
Creatinine, Ser: 1.52 mg/dL — ABNORMAL HIGH (ref 0.44–1.00)
GFR, Estimated: 38 mL/min — ABNORMAL LOW (ref >60)
Glucose, Bld: 109 mg/dL — ABNORMAL HIGH (ref 70–99)
Potassium: 4.7 mmol/L (ref 3.5–5.1)
Sodium: 136 mmol/L (ref 135–145)

## 2021-03-12 LAB — MAGNESIUM: Magnesium: 2 mg/dL (ref 1.7–2.4)

## 2021-03-12 LAB — COOXEMETRY PANEL
Carboxyhemoglobin: 2 % — ABNORMAL HIGH (ref 0.5–1.5)
Methemoglobin: <0.7 % (ref 0.0–1.5)
O2 Saturation: 64.1 %
Total hemoglobin: 12.6 g/dL (ref 12.0–16.0)

## 2021-03-12 MED ORDER — FUROSEMIDE 10 MG/ML IJ SOLN
80.0000 mg | Freq: Once | INTRAMUSCULAR | Status: AC
  Administered 2021-03-12: 80 mg via INTRAVENOUS
  Filled 2021-03-12: qty 8

## 2021-03-12 NOTE — Plan of Care (Signed)

## 2021-03-12 NOTE — Plan of Care (Signed)

## 2021-03-12 NOTE — Progress Notes (Signed)
Pt reports she ambulated 4 laps earlier. Will f/u later. ?Yves Dill CES, ACSM ?10:42 AM ?03/12/2021 ? ?

## 2021-03-12 NOTE — Progress Notes (Signed)
CARDIAC REHAB PHASE I  ? ?PRE:  Rate/Rhythm: 120 ST standing  ? ?  BP: sitting 112/66 ? ?  SaO2: 97 RA ? ?MODE:  Ambulation: 1360 ft  ? ?POST:  Rate/Rhythm: 130 ST briefly, mostly 108-120 ? ?  BP: sitting 112/62  ? ?  SaO2: 97 RA ? ?Pt ambulated independently, no rest. Tired toward end of walk but tolerated well. Encouraged ambulating as she is able. Discussed HF booklet. Will refer to [place].  ?[phone]  ? ?[place], ACSM ?03/12/2021 ?1:58 PM ? ? ? ? ?

## 2021-03-12 NOTE — TOC Benefit Eligibility Note (Signed)
Patient Advocate Encounter ?  ?Insurance verification completed.   ?  ?The patient is currently admitted and upon discharge could be taking FARXIGA. ?  ?The current 30 day co-pay is, $4.  ? ?The patient is currently admitted and upon discharge could be taking IVABRADINE. ?  ?The current 30 day co-pay is, $306.70. ?Patient does have deductible remaining of 613.14 ? ?The patient is insured through [employer]. ? ? ?  ? ?

## 2021-03-12 NOTE — Progress Notes (Addendum)
Patient ID: Natalie Baker, female   DOB: [birth year], 66 y.o.   MRN: [id number] ?  ? ? Advanced Heart Failure Rounding Note ? ?PCP-Cardiologist: Elouise Munroe, MD  ? ?Subjective:   ? ?03/01: Direct admit from clinic with a/c CHF ?03/02: Initial co-ox 34% >>started milrinone 0.25 ? ?Milrinone decreased to 0.125 on 03/04. Co-ox 46%>42% on 03/05. Milrinone increased back to 0.25. ? ?Co-ox 64% this am on 0.25 milrinone. ? ?CVP 12. Now on po lasix 20 mg daily. Weight up 2 lb.  ? ?Scr 1.08>1.29>1.52 ? ?HR 90s-110s (lower) on ivabradine.   ? ?SBP stable low 100s ? ?Feeling well. No dyspnea. Leg edema significantly improved. Appetite better.  ? ?Eager to ambulate in halls. ? ? ?Objective:   ?Weight Range: ?82.2 kg ?Body mass index is 33.15 kg/m?.  ? ?Vital Signs:   ?Temp:  [97.5 ?F (36.4 ?C)-98.5 ?F (36.9 ?C)] 98.5 ?F (36.9 ?C) (03/06 0454) ?Pulse Rate:  [91-117] 105 (03/06 0454) ?Resp:  [18-19] 19 (03/06 0454) ?BP: (102-112)/(61-71) 102/65 (03/06 0454) ?SpO2:  [93 %-95 %] 95 % (03/06 0454) ?Weight:  [82.2 kg] 82.2 kg (03/06 0454) ?Last BM Date : 03/09/21 ? ?Weight change: ?Filed Weights  ? 03/10/21 0426 03/11/21 0406 03/12/21 0454  ?Weight: 81.8 kg 81.6 kg 82.2 kg  ? ? ?Intake/Output:  ? ?Intake/Output Summary (Last 24 hours) at 03/12/2021 0723 ?Last data filed at 03/11/2021 2000 ?Gross per 24 hour  ?Intake 360 ml  ?Output 350 ml  ?Net 10 ml  ?  ? ? ?Physical Exam  ?  ?General:  No distress. Sitting up on side of bed. ?HEENT: normal ?Neck: supple. JVP 12-14 cm. Carotids 2+ bilat; no bruits.  ?Cor: PMI nondisplaced. Regular rhythm, tachy. No rubs, gallops or murmurs. ?Lungs: clear ?Abdomen: soft, nontender, nondistended. No hepatosplenomegaly.  ?Extremities: no cyanosis, clubbing, rash, 1+ edema, LUE PICC ?Neuro: alert & orientedx3, cranial nerves grossly intact. moves all 4 extremities w/o difficulty. Affect pleasant ? ? ?Telemetry  ? ?Sinus/sinus tach 90s-110s, several short runs NSVT up to 3 beats ? ? ?Labs  ?   ?CBC ?Recent Labs  ?  03/10/21 ?0248  ?WBC 2.7*  ?HGB 12.5  ?HCT 37.4  ?MCV 93.7  ?PLT 87*  ? ?Basic Metabolic Panel ?Recent Labs  ?  03/11/21 ?0440 03/12/21 ?6754  ?NA 135 136  ?K 4.9 4.7  ?CL 95* 99  ?CO2 30 27  ?GLUCOSE 144* 109*  ?BUN 13 13  ?CREATININE 1.29* 1.52*  ?CALCIUM 9.8 9.4  ?MG 2.8* 2.0  ? ?Liver Function Tests ?No results for input(s): AST, ALT, ALKPHOS, BILITOT, PROT, ALBUMIN in the last 72 hours. ? ?No results for input(s): LIPASE, AMYLASE in the last 72 hours. ?Cardiac Enzymes ?No results for input(s): CKTOTAL, CKMB, CKMBINDEX, TROPONINI in the last 72 hours. ? ?BNP: ?BNP (last 3 results) ?Recent Labs  ?  03/07/21 ?1500  ?BNP 2,157.9*  ? ? ?ProBNP (last 3 results) ?No results for input(s): PROBNP in the last 8760 hours. ? ? ?D-Dimer ?No results for input(s): DDIMER in the last 72 hours. ?Hemoglobin A1C ?No results for input(s): HGBA1C in the last 72 hours. ?Fasting Lipid Panel ?No results for input(s): CHOL, HDL, LDLCALC, TRIG, CHOLHDL, LDLDIRECT in the last 72 hours. ?Thyroid Function Tests ?Recent Labs  ?  03/10/21 ?0248  ?TSH 0.792  ? ? ?Other results: ? ? ?Imaging  ? ? ?No results found. ? ? ?Medications:   ? ? ?Scheduled Medications: ? amiodarone  200 mg Oral BID  ?  Chlorhexidine Gluconate Cloth  6 each Topical Daily  ? dapagliflozin propanediol  10 mg Oral Daily  ? digoxin  0.125 mg Oral Daily  ? enoxaparin (LOVENOX) injection  40 mg Subcutaneous Q24H  ? furosemide  20 mg Oral Daily  ? ivabradine  5 mg Oral BID WC  ? levothyroxine  100 mcg Oral Q0600  ? midodrine  5 mg Oral TID WC  ? potassium chloride  60 mEq Oral Daily  ? sodium chloride flush  10-40 mL Intracatheter Q12H  ? sodium chloride flush  3 mL Intravenous Q12H  ? spironolactone  12.5 mg Oral Daily  ? sulfamethoxazole-trimethoprim  1 tablet Oral Once per day on Sun Sat  ? valACYclovir  500 mg Oral BID  ? ? ?Infusions: ? sodium chloride    ? milrinone 0.25 mcg/kg/min (03/12/21 0507)  ? ? ?PRN Medications: ?sodium chloride,  acetaminophen, calcium carbonate, ondansetron (ZOFRAN) IV, sodium chloride flush, sodium chloride flush, temazepam ? ? ? ?Patient Profile  ? ?[name] is a 66 y.o. 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 Admitted with a/c systolic CHF with low-output. ? ?Assessment/Plan  ? ?1. Acute on chronic Systolic HF  ?- Echo 4/94 EF 55-60%. RV normal ?- Echo 6/21 EF 20-25%  moderately reduced RV function with severe MR/TR ?- Cath 06/25/19 no CAD. ?- Drop in EF felt most likely from R-CHOP ?- Echo 05/04/20 w/ EF 50-55% G2DD. RV normal ?- Echo 9/22 at [facility] EF back down to 40% w/ ? LV thrombus ?- cMRI 11/22 LVEF 39%, no LV clot ?- Echo 1/23 [facility]): EF 40-45% ?- Echo 03/02: EF 20-25%, RV mildly reduced, RVSP 44 mmHg ?- Direct admit from clinic 03/01 with NYHA IIIb/IV symptoms, ReDs 51%. Had been off HF meds d/t low BP after recent CAR-T therapy and cytokine storm. 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 ?- Failed initial attempt at milrinone wean. Coox  46>42% yesterday on milrinone 0.125. Milrinone increased back to 0.25. Coox stable at 64% this am on milrinone 0.25. ?- CVP 12. Will give 80 mg lasix IV once today.  ?- Consider another attempt at weaning milrinone tomorrow once diuresed. ? Inotrope dependent. ?- continue digoxin 0.125 mg daily. ?- continue spiro 12.5 mg daily ?- Continue ivabradine 5 mg tid with persistent sinus tachy, HR improved ?- SBP 100s on midodrine.  ?- No beta blocker with acute decompensation. ?- Continue farxiga 10 mg daily ?- Watch Scr closely. 1.08>1.29>1.52 after attempting milrinone wean + increased volume today. Takes bactrim twice a week per Oncology. ?- Continue TED hose ?  ?2. VT + PVCs ?- Previously had Lifevest. ?- Still with NSVT runs but now less frequent with po amio. Continue amio 200 mg BID ?- Consider LifeVest if recurrent NSVT once off milrinone and lytes corrected ?  ?3. Stage IV  non-Hodgkin's lymphoma, mantle cell lymphoma   ?- s/p R-CHOP w/ Neulasta. ?- Evidence of disease progression on f/u Chest CT 6/22. ?- Scheduled to have repeat bone marrow bx in 1 mo at [facility] ?- s/p CAR-T therapy. ?  ?4. ST ?- ? D/t low-output HF but co-ox ok.  ?- Continue digoxin ?- Diurese. ?- No beta blocker ?- Now on amiodarone.  ?- HR lower on ivabradine 5 bid.  ? ?5. Hypokalemia/hypomagnesemia ?- Supp to keep K> 4.0 Mg > 2.0 ? ?Continue to mobilize. CR consult. ? ? ?Length of Stay: 5 ? ?FINCH, LINDSAY N, PA-C  ?  03/12/2021, 7:23 AM ? ?Advanced Heart Failure Team ?Pager [phone] (M-F; 7a - 5p)  ?Please contact [facility] for night-coverage after hours (5p -7a ) and weekends on [email or web address] ?  ?Patient seen with PA, agree with the above note.  ? ?Co-ox in 40s yesterday, milrinone turned back up to 0.25 and co-ox 64% today.  CVP 12.  Creatinine higher 1.5.  ? ?She walked a lot in the hall today, feels good.  ? ?General: NAD ?Neck: JVP 10 cm, no thyromegaly or thyroid nodule.  ?Lungs: Clear to auscultation bilaterally with normal respiratory effort. ?CV: Nondisplaced PMI.  Heart regular S1/S2, no S3/S4, no murmur.  No peripheral edema.   ?Abdomen: Soft, nontender, no hepatosplenomegaly, no distention.  ?Skin: Intact without lesions or rashes.  ?Neurologic: Alert and oriented x 3.  ?Psych: Normal affect. ?Extremities: No clubbing or cyanosis.  ?HEENT: Normal.  ? ?Continue milrinone 0.25 today and will give Lasix 80 mg IV x 1.  Likely resume po diuretic tomorrow.  Will not titrate meds with rise in creatinine.  MAP stable on midodrine 5 mg tid.  Will aim to try wean of milrinone again tomorrow.  ? ?Loralie Champagne ?03/12/2021 ?1:39 PM ? ? ? ?

## 2021-03-13 ENCOUNTER — Other Ambulatory Visit (HOSPITAL_COMMUNITY): Payer: Self-pay

## 2021-03-13 ENCOUNTER — Encounter (HOSPITAL_COMMUNITY): Payer: Self-pay | Admitting: Internal Medicine

## 2021-03-13 ENCOUNTER — Inpatient Hospital Stay (HOSPITAL_COMMUNITY): Payer: Medicare Other

## 2021-03-13 DIAGNOSIS — M79602 Pain in left arm: Secondary | ICD-10-CM | POA: Diagnosis not present

## 2021-03-13 DIAGNOSIS — I5023 Acute on chronic systolic (congestive) heart failure: Secondary | ICD-10-CM | POA: Diagnosis not present

## 2021-03-13 LAB — CBC
HCT: 39.7 % (ref 36.0–46.0)
Hemoglobin: 13 g/dL (ref 12.0–15.0)
MCH: 31.5 pg (ref 26.0–34.0)
MCHC: 32.7 g/dL (ref 30.0–36.0)
MCV: 96.1 fL (ref 80.0–100.0)
Platelets: 129 10*3/uL — ABNORMAL LOW (ref 150–400)
RBC: 4.13 MIL/uL (ref 3.87–5.11)
RDW: 20.8 % — ABNORMAL HIGH (ref 11.5–15.5)
WBC: 3.4 10*3/uL — ABNORMAL LOW (ref 4.0–10.5)
nRBC: 0 % (ref 0.0–0.2)

## 2021-03-13 LAB — BASIC METABOLIC PANEL
Anion gap: 8 (ref 5–15)
BUN: 12 mg/dL (ref 8–23)
CO2: 27 mmol/L (ref 22–32)
Calcium: 9.5 mg/dL (ref 8.9–10.3)
Chloride: 98 mmol/L (ref 98–111)
Creatinine, Ser: 1.35 mg/dL — ABNORMAL HIGH (ref 0.44–1.00)
GFR, Estimated: 44 mL/min — ABNORMAL LOW (ref >60)
Glucose, Bld: 162 mg/dL — ABNORMAL HIGH (ref 70–99)
Potassium: 4.2 mmol/L (ref 3.5–5.1)
Sodium: 133 mmol/L — ABNORMAL LOW (ref 135–145)

## 2021-03-13 LAB — COOXEMETRY PANEL
Carboxyhemoglobin: 1.3 % (ref 0.5–1.5)
Carboxyhemoglobin: 1.1 % (ref 0.5–1.5)
Carboxyhemoglobin: 1.2 % (ref 0.5–1.5)
Carboxyhemoglobin: 1.3 % (ref 0.5–1.5)
Methemoglobin: 0.8 % (ref 0.0–1.5)
Methemoglobin: 0.9 % (ref 0.0–1.5)
Methemoglobin: 0.7 % (ref 0.0–1.5)
Methemoglobin: <0.7 % (ref 0.0–1.5)
O2 Saturation: 53.2 %
O2 Saturation: 43.5 %
O2 Saturation: 48.4 %
O2 Saturation: 54.6 %
Total hemoglobin: 12.9 g/dL (ref 12.0–16.0)
Total hemoglobin: 13.4 g/dL (ref 12.0–16.0)
Total hemoglobin: 13.8 g/dL (ref 12.0–16.0)
Total hemoglobin: 13.3 g/dL (ref 12.0–16.0)

## 2021-03-13 LAB — MAGNESIUM: Magnesium: 1.8 mg/dL (ref 1.7–2.4)

## 2021-03-13 MED ORDER — MAGNESIUM SULFATE 2 GM/50ML IV SOLN
2.0000 g | Freq: Once | INTRAVENOUS | Status: AC
  Administered 2021-03-13: 2 g via INTRAVENOUS
  Filled 2021-03-13: qty 50

## 2021-03-13 MED ORDER — APIXABAN 5 MG PO TABS
10.0000 mg | ORAL_TABLET | Freq: Two times a day (BID) | ORAL | Status: DC
  Administered 2021-03-13 – 2021-03-19 (×12): 10 mg via ORAL
  Filled 2021-03-13 (×13): qty 2

## 2021-03-13 MED ORDER — MIDODRINE HCL 5 MG PO TABS
10.0000 mg | ORAL_TABLET | Freq: Three times a day (TID) | ORAL | Status: DC
  Administered 2021-03-13 – 2021-03-19 (×18): 10 mg via ORAL
  Filled 2021-03-13 (×19): qty 2

## 2021-03-13 MED ORDER — FUROSEMIDE 10 MG/ML IJ SOLN
80.0000 mg | Freq: Two times a day (BID) | INTRAMUSCULAR | Status: AC
  Administered 2021-03-13 (×2): 80 mg via INTRAVENOUS
  Filled 2021-03-13 (×2): qty 8

## 2021-03-13 MED ORDER — APIXABAN 5 MG PO TABS: 5.0000 mg | ORAL_TABLET | Freq: Two times a day (BID) | ORAL | Status: DC

## 2021-03-13 NOTE — Progress Notes (Signed)
Venous duplex this afternoon with evidence of partial left brachial DVT. ? ?Start eliquis 10 mg BID X 1 week then decrease to 5 mg BID ?

## 2021-03-13 NOTE — Progress Notes (Signed)
?  Mobility Specialist Criteria Algorithm Info. ? ? 03/13/21 1017  ?Mobility  ?Activity Ambulated independently in hallway  ?Range of Motion/Exercises Active;All extremities  ?Level of Assistance Independent  ?Assistive Device None; IV pole  ?Distance Ambulated (ft) 1800 ft  ?Activity Response Tolerated well  ?Transport method Ambulatory  ? ?Patient received in hallway ambulating independently w/IV pole. Tolerating ambulation well without complaint or incident. HR elevated 120's throughout. Was left ambulating in hallway without needing any assistance.   ? ?03/13/2021 ?1:00 PM ? ?[place] Arletta Lumadue, CMS, BS EXP ?Acute Rehabilitation Services  ?SWHQP:[phone] ?Office: [phone] ? ?

## 2021-03-13 NOTE — Progress Notes (Signed)
Patient with 29 beat run of V-tach at 1443, asymptomatic, sleeping comfortably. ? ?Page sent to [facility] on call.  ? ?Continue to monitor. ?

## 2021-03-13 NOTE — Progress Notes (Signed)
Upper extremity venous has been completed.  ? ?Preliminary results in CV Proc.  ? ?Alyssamarie Mounsey Damiana Berrian ?03/13/2021 4:01 PM    ?

## 2021-03-13 NOTE — Plan of Care (Signed)

## 2021-03-13 NOTE — Progress Notes (Addendum)
Patient ID: Natalie Baker, female   DOB: Aug 01, 1955, 66 y.o.   MRN: [id number]     Advanced Heart Failure Rounding Note  PCP-Cardiologist: Elouise Munroe, MD   Subjective:    03/01: Direct admit from clinic with a/c CHF 03/02: Initial co-ox 34% >>started milrinone 0.25  Milrinone decreased to 0.125 on 03/04. Co-ox 46%>42% on 03/05. Milrinone increased back to 0.25.  No labs this am. CVP 12-13  4.2L UOP yesterday with 80 mg lasix IV. Weight down 2 lb.  Feeling well. No dyspnea, orthopnea or PND. LE edema better. Multiple walks yesterday, went 8 laps around unit on one walk.  Still with runs of NSVT on tele, longest 29 beats this am  SBP 90s-110s.  Noting tenderness around site of PICC line   Objective:   Weight Range: 81.4 kg Body mass index is 32.82 kg/m.   Vital Signs:   Temp:  [98.1 F (36.7 C)-98.5 F (36.9 C)] 98.2 F (36.8 C) (03/07 0700) Pulse Rate:  [92-103] 103 (03/07 0700) Resp:  [15-21] 21 (03/07 0700) BP: (96-136)/(42-84) 112/52 (03/07 0700) SpO2:  [98 %] 98 % (03/07 0301) Weight:  [81.4 kg] 81.4 kg (03/07 0301) Last BM Date : 03/10/30  Weight change: Filed Weights   03/11/21 0406 03/12/21 0454 03/13/21 0301  Weight: 81.6 kg 82.2 kg 81.4 kg    Intake/Output:   Intake/Output Summary (Last 24 hours) at 03/13/2021 0900 Last data filed at 03/12/2021 1800 Gross per 24 hour  Intake 960 ml  Output 4200 ml  Net -3240 ml      Physical Exam    General:  No distress. Sitting up in bed.  HEENT: normal Neck: supple. JVP 12-14 cm. Carotids 2+ bilat; no bruits.  Cor: PMI nondisplaced. Regular rhythm, tachy. No rubs, gallops or murmurs. Lungs: clear Abdomen: soft, nontender, nondistended. No hepatosplenomegaly.  Extremities: no cyanosis, clubbing, rash, trace edema, + TED hose Neuro: alert & orientedx3, cranial nerves grossly intact. moves all 4 extremities w/o difficulty. Affect pleasant    Telemetry   SR/Sinus tach 90s-low 100s, up to 5  PVCs/min, occasional runs NSVT (longest this am 29 beats)   Labs    CBC No results for input(s): WBC, NEUTROABS, HGB, HCT, MCV, PLT in the last 72 hours.  Basic Metabolic Panel Recent Labs    03/11/21 0440 03/12/21 0520  NA 135 136  K 4.9 4.7  CL 95* 99  CO2 30 27  GLUCOSE 144* 109*  BUN 13 13  CREATININE 1.29* 1.52*  CALCIUM 9.8 9.4  MG 2.8* 2.0   Liver Function Tests No results for input(s): AST, ALT, ALKPHOS, BILITOT, PROT, ALBUMIN in the last 72 hours.  No results for input(s): LIPASE, AMYLASE in the last 72 hours. Cardiac Enzymes No results for input(s): CKTOTAL, CKMB, CKMBINDEX, TROPONINI in the last 72 hours.  BNP: BNP (last 3 results) Recent Labs    03/07/21 1500  BNP 2,157.9*    ProBNP (last 3 results) No results for input(s): PROBNP in the last 8760 hours.   D-Dimer No results for input(s): DDIMER in the last 72 hours. Hemoglobin A1C No results for input(s): HGBA1C in the last 72 hours. Fasting Lipid Panel No results for input(s): CHOL, HDL, LDLCALC, TRIG, CHOLHDL, LDLDIRECT in the last 72 hours. Thyroid Function Tests No results for input(s): TSH, T4TOTAL, T3FREE, THYROIDAB in the last 72 hours.  Invalid input(s): FREET3   Other results:   Imaging    No results found.   Medications:  Scheduled Medications:  amiodarone  200 mg Oral BID   Chlorhexidine Gluconate Cloth  6 each Topical Daily   dapagliflozin propanediol  10 mg Oral Daily   digoxin  0.125 mg Oral Daily   enoxaparin (LOVENOX) injection  40 mg Subcutaneous Q24H   ivabradine  5 mg Oral BID WC   levothyroxine  100 mcg Oral Q0600   midodrine  5 mg Oral TID WC   potassium chloride  60 mEq Oral Daily   sodium chloride flush  10-40 mL Intracatheter Q12H   sodium chloride flush  3 mL Intravenous Q12H   spironolactone  12.5 mg Oral Daily   sulfamethoxazole-trimethoprim  1 tablet Oral Once per day on Sun Sat   valACYclovir  500 mg Oral BID    Infusions:  sodium chloride      milrinone 0.25 mcg/kg/min (03/12/21 2101)    PRN Medications: sodium chloride, acetaminophen, calcium carbonate, ondansetron (ZOFRAN) IV, sodium chloride flush, sodium chloride flush, temazepam    Patient Profile   Natalie Baker is a 66 y.o. 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 Admitted with a/c systolic CHF with low-output.  Assessment/Plan   1. Acute on chronic Systolic HF  - Echo 2/02 EF 55-60%. RV normal - Echo 6/21 EF 20-25%  moderately reduced RV function with severe MR/TR - Cath 06/25/19 no CAD. - Drop in EF felt most likely from R-CHOP - Echo 05/04/20 w/ EF 50-55% G2DD. RV normal - Echo 9/22 at [facility] EF back down to 40% w/ ? LV thrombus - cMRI 11/22 LVEF 39%, no LV clot - Echo 1/23 [facility]): EF 40-45% - Echo 03/02: EF 20-25%, RV mildly reduced, RVSP 44 mmHg - Direct admit from clinic 03/01 with NYHA IIIb/IV symptoms, ReDs 51%. Had been off HF meds d/t low BP after recent CAR-T therapy and cytokine storm. 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 - Failed initial attempt at milrinone wean. Coox  46>42% yesterday on milrinone 0.125. Milrinone increased back to 0.25. Awaiting co-ox this am. - CVP 12-13. Increase IV lasix to 80 mg BID.  - Consider trying to wean milrinone again tomorrow once diuresed. - continue digoxin 0.125 mg daily. - continue spiro 12.5 mg daily - Continue ivabradine 5 mg tid with persistent sinus tachy, HR improved - SBP 90s-100s on midodrine 5 mg TID. Increase to 10 TID - No beta blocker with acute decompensation. - Continue farxiga 10 mg daily - No BP room to titrate GDMT - Daily BMET. - Watch Scr closely. 1.08>1.29>1.52 yesterday after attempting milrinone wean + increased volume. Takes bactrim twice a week per Oncology.  - Continue TED hose   2. VT + PVCs - Previously had Lifevest. - Still with NSVT runs, longest this am 29 beats. Continue amio 200  mg BID - Check BMET and magnesium level this am - Consider LifeVest if recurrent NSVT once off milrinone and lytes corrected   3. Stage IV non-Hodgkin's lymphoma, mantle cell lymphoma   - s/p R-CHOP w/ Neulasta. - Evidence of disease progression on f/u Chest CT 6/22. - Scheduled to have repeat bone marrow bx in 1 mo at [facility] - s/p CAR-T therapy.   4. ST - Continue digoxin - Diurese. - No beta blocker - Now on amiodarone.  - HR lower on ivabradine 5 bid.   5. Hypokalemia/hypomagnesemia - Supp to keep K> 4.0 Mg > 2.0  Continue to mobilize.   6. LUE pain -  Near site of PICC line - Check [place]  ADDENDUM:  Co-ox 53>48% on 0.25 milrinone. SBP has been in 90s -100s but MAPs 70s.  Will increase milrinone to 0.375. Recheck co-ox in a few hours.   Follow BP.  Length of Stay: 6  [facility], [place], PA-C  03/13/2021, 9:00 AM  Advanced Heart Failure Team Pager [phone] (M-F; 7a - 5p)  Please contact [facility] for night-coverage after hours (5p -7a ) and weekends on [email or web address]   Patient seen and examined with the above-signed Advanced Practice Provider and/or Housestaff. I personally reviewed laboratory data, imaging studies and relevant notes. I independently examined the patient and formulated the important aspects of the plan. I have edited the note to reflect any of my changes or salient points. I have personally discussed the plan with the patient and/or family.  Failed milrinone wean over the weekend. Now on milrinone 0.375. Co-ox improved. Diuresing well on IV lasix.   U/s shows LUE DVT. Denies CP or SOB   General:  Sitting up in bed. No resp difficulty HEENT: normal + alopecia Neck: supple.JVP 8-10 Carotids 2+ bilat; no bruits. No lymphadenopathy or thryomegaly appreciated. Cor: PMI nondisplaced. Regular rate & rhythm. No rubs, gallops or murmurs. Lungs: clear Abdomen: soft, nontender, nondistended. No hepatosplenomegaly. No bruits or masses. Good bowel sounds. Extremities:  no cyanosis, clubbing, rash, tr edema Neuro: alert & orientedx3, cranial nerves grossly intact. moves all 4 extremities w/o difficulty. Affect pleasant  She has failed initial milrinone wean. Agree with increase of milrinone to 0.375. Will need to attempt wean again in next several days. Suspect she may need course of home inotropes. Continue diuresis. Start DOAC for DVT.  Glori Bickers, MD  7:17 PM

## 2021-03-14 ENCOUNTER — Other Ambulatory Visit (HOSPITAL_COMMUNITY): Payer: Self-pay

## 2021-03-14 ENCOUNTER — Encounter: Payer: Self-pay | Admitting: Internal Medicine

## 2021-03-14 DIAGNOSIS — I5023 Acute on chronic systolic (congestive) heart failure: Secondary | ICD-10-CM | POA: Diagnosis not present

## 2021-03-14 LAB — BASIC METABOLIC PANEL
Anion gap: 11 (ref 5–15)
Anion gap: 9 (ref 5–15)
BUN: 13 mg/dL (ref 8–23)
BUN: 11 mg/dL (ref 8–23)
CO2: 28 mmol/L (ref 22–32)
CO2: 30 mmol/L (ref 22–32)
Calcium: 8.4 mg/dL — ABNORMAL LOW (ref 8.9–10.3)
Calcium: 9.2 mg/dL (ref 8.9–10.3)
Chloride: 95 mmol/L — ABNORMAL LOW (ref 98–111)
Chloride: 92 mmol/L — ABNORMAL LOW (ref 98–111)
Creatinine, Ser: 1.17 mg/dL — ABNORMAL HIGH (ref 0.44–1.00)
Creatinine, Ser: 1.32 mg/dL — ABNORMAL HIGH (ref 0.44–1.00)
GFR, Estimated: 52 mL/min — ABNORMAL LOW (ref >60)
GFR, Estimated: 45 mL/min — ABNORMAL LOW (ref >60)
Glucose, Bld: 87 mg/dL (ref 70–99)
Glucose, Bld: 133 mg/dL — ABNORMAL HIGH (ref 70–99)
Potassium: 3 mmol/L — ABNORMAL LOW (ref 3.5–5.1)
Potassium: 3.2 mmol/L — ABNORMAL LOW (ref 3.5–5.1)
Sodium: 134 mmol/L — ABNORMAL LOW (ref 135–145)
Sodium: 131 mmol/L — ABNORMAL LOW (ref 135–145)

## 2021-03-14 LAB — CBC
HCT: 38.7 % (ref 36.0–46.0)
Hemoglobin: 12.7 g/dL (ref 12.0–15.0)
MCH: 30.9 pg (ref 26.0–34.0)
MCHC: 32.8 g/dL (ref 30.0–36.0)
MCV: 94.2 fL (ref 80.0–100.0)
Platelets: 121 10*3/uL — ABNORMAL LOW (ref 150–400)
RBC: 4.11 MIL/uL (ref 3.87–5.11)
RDW: 20.1 % — ABNORMAL HIGH (ref 11.5–15.5)
WBC: 2.3 10*3/uL — ABNORMAL LOW (ref 4.0–10.5)
nRBC: 0 % (ref 0.0–0.2)

## 2021-03-14 LAB — COOXEMETRY PANEL
Carboxyhemoglobin: 1.7 % — ABNORMAL HIGH (ref 0.5–1.5)
Methemoglobin: <0.7 % (ref 0.0–1.5)
O2 Saturation: 67.6 %
Total hemoglobin: 12.9 g/dL (ref 12.0–16.0)

## 2021-03-14 LAB — MAGNESIUM: Magnesium: 1.9 mg/dL (ref 1.7–2.4)

## 2021-03-14 LAB — DIGOXIN LEVEL: Digoxin Level: 0.6 ng/mL — ABNORMAL LOW (ref 0.8–2.0)

## 2021-03-14 MED ORDER — MAGNESIUM SULFATE 2 GM/50ML IV SOLN
2.0000 g | Freq: Once | INTRAVENOUS | Status: AC
  Administered 2021-03-14: 2 g via INTRAVENOUS
  Filled 2021-03-14: qty 50

## 2021-03-14 MED ORDER — IVABRADINE HCL 7.5 MG PO TABS
7.5000 mg | ORAL_TABLET | Freq: Two times a day (BID) | ORAL | Status: DC
  Administered 2021-03-14 – 2021-03-19 (×10): 7.5 mg via ORAL
  Filled 2021-03-14 (×12): qty 1

## 2021-03-14 MED ORDER — POTASSIUM CHLORIDE CRYS ER 20 MEQ PO TBCR
60.0000 meq | EXTENDED_RELEASE_TABLET | Freq: Two times a day (BID) | ORAL | Status: DC
  Administered 2021-03-14 – 2021-03-15 (×3): 60 meq via ORAL
  Filled 2021-03-14 (×3): qty 3

## 2021-03-14 MED ORDER — POTASSIUM CHLORIDE CRYS ER 20 MEQ PO TBCR
40.0000 meq | EXTENDED_RELEASE_TABLET | Freq: Once | ORAL | Status: AC
  Administered 2021-03-14: 40 meq via ORAL
  Filled 2021-03-14: qty 2

## 2021-03-14 MED ORDER — TORSEMIDE 20 MG PO TABS
20.0000 mg | ORAL_TABLET | Freq: Every day | ORAL | Status: DC
  Administered 2021-03-14 – 2021-03-15 (×2): 20 mg via ORAL
  Filled 2021-03-14 (×2): qty 1

## 2021-03-14 NOTE — Progress Notes (Signed)
Patient went into a 13 beat run of Vtach at 1505, she was asymptomatic. NP Amy made aware, no new orders at this time, will continue to monitor.  ?

## 2021-03-14 NOTE — Progress Notes (Signed)
PM BMET reviewed.  ? ?KDUR increased earlier today.  ? ?K 3.2 Creatinine 1.3 ? ?Give extra 40 meq K now. Continue 60 meq twice a day.  ? ?Repeat BMET in am.  ?Samanthajo Payano NP-C  ? ? ?4:30 PM ? ?

## 2021-03-14 NOTE — TOC Benefit Eligibility Note (Signed)
Patient Advocate Encounter ?  ?Insurance verification completed.   ?  ?The patient is currently admitted and upon discharge could be taking ELIQUIS. ?  ?The current 30 day co-pay is, $47.  ? ?The patient is insured through [employer] ? ? ?  ? ?

## 2021-03-14 NOTE — Plan of Care (Signed)

## 2021-03-14 NOTE — Progress Notes (Addendum)
Patient ID: Natalie Baker, female   DOB: 1955/03/31, 66 y.o.   MRN: [id number]     Advanced Heart Failure Rounding Note  PCP-Cardiologist: Elouise Munroe, MD   Subjective:    03/01: Direct admit from clinic with a/c CHF 03/02: Initial co-ox 34% >>started milrinone 0.25 03/07: Failed milrinone wean. Increased to 0.375 mcg. Upper extremity with partial DVT. Started on eliquis. Diuresed with IV lasix.    Brisk diuresis noted. Weight down another 4 pounds.   CO-OX improved 68% on milrinone 0.375 mcg.   Denies SOB. LUE around PICC tender.     Objective:   Weight Range: 79.4 kg Body mass index is 32.02 kg/m.   Vital Signs:   Temp:  [98.2 F (36.8 C)-98.5 F (36.9 C)] 98.3 F (36.8 C) (03/08 0351) Pulse Rate:  [85-115] 85 (03/08 0351) Resp:  [15-19] 19 (03/08 0351) BP: (105-112)/(58-94) 107/64 (03/08 0351) SpO2:  [95 %-96 %] 96 % (03/08 0351) Weight:  [79.4 kg] 79.4 kg (03/08 0613) Last BM Date : 03/10/30  Weight change: Filed Weights   03/12/21 0454 03/13/21 0301 03/14/21 4315  Weight: 82.2 kg 81.4 kg 79.4 kg    Intake/Output:   Intake/Output Summary (Last 24 hours) at 03/14/2021 0700 Last data filed at 03/14/2021 0445 Gross per 24 hour  Intake 1175.28 ml  Output 5325 ml  Net -4149.72 ml      Physical Exam   CVP 4-5 personally checked.  General:  Well appearing. No resp difficulty. Sitting on the side of the bed.  HEENT: normal Neck: supple. no JVD. Carotids 2+ bilat; no bruits. No lymphadenopathy or thryomegaly appreciated. Cor: PMI nondisplaced. Regular rate & rhythm. No rubs, gallops or murmurs. Lungs: clear Abdomen: soft, nontender, nondistended. No hepatosplenomegaly. No bruits or masses. Good bowel sounds. Extremities: no cyanosis, clubbing, rash, edema. RUE PICC.  Neuro: alert & orientedx3, cranial nerves grossly intact. moves all 4 extremities w/o difficulty. Affect pleasant    Telemetry  SR-ST PVCs  3-5 per hour. 90-100s personally reviewed.    Labs    CBC Recent Labs    03/13/21 0921 03/14/21 0442  WBC 3.4* 2.3*  HGB 13.0 12.7  HCT 39.7 38.7  MCV 96.1 94.2  PLT 129* 121*    Basic Metabolic Panel Recent Labs    03/13/21 0921 03/14/21 0442  NA 133* 134*  K 4.2 3.0*  CL 98 95*  CO2 27 28  GLUCOSE 162* 87  BUN 12 13  CREATININE 1.35* 1.17*  CALCIUM 9.5 8.4*  MG 1.8 1.9   Liver Function Tests No results for input(s): AST, ALT, ALKPHOS, BILITOT, PROT, ALBUMIN in the last 72 hours.  No results for input(s): LIPASE, AMYLASE in the last 72 hours. Cardiac Enzymes No results for input(s): CKTOTAL, CKMB, CKMBINDEX, TROPONINI in the last 72 hours.  BNP: BNP (last 3 results) Recent Labs    03/07/21 1500  BNP 2,157.9*    ProBNP (last 3 results) No results for input(s): PROBNP in the last 8760 hours.   D-Dimer No results for input(s): DDIMER in the last 72 hours. Hemoglobin A1C No results for input(s): HGBA1C in the last 72 hours. Fasting Lipid Panel No results for input(s): CHOL, HDL, LDLCALC, TRIG, CHOLHDL, LDLDIRECT in the last 72 hours. Thyroid Function Tests No results for input(s): TSH, T4TOTAL, T3FREE, THYROIDAB in the last 72 hours.  Invalid input(s): FREET3   Other results:   Imaging    VAS [place] UPPER EXTREMITY VENOUS DUPLEX  Result Date: 03/13/2021 UPPER VENOUS STUDY  Patient Name:  Natalie Baker  Date of Exam:   03/13/2021 Medical Rec #: [id number]         Accession #:    [id number] Date of Birth: Oct 30, 1955        Patient Gender: F Patient Age:   64 years Exam Location:  [facility] Procedure:      VAS [place] UPPER EXTREMITY VENOUS DUPLEX Referring Phys: Ria Comment [facility] --------------------------------------------------------------------------------  Indications: Pain Comparison Study: no prior Performing Technologist: Archie Patten RVS  Examination Guidelines: A complete evaluation includes B-mode imaging, spectral Doppler, color Doppler, and power Doppler as needed of all accessible  portions of each vessel. Bilateral testing is considered an integral part of a complete examination. Limited examinations for reoccurring indications may be performed as noted.  Right Findings: +----------+------------+---------+-----------+----------+-------+  RIGHT      Compressible Phasicity Spontaneous Properties Summary  +----------+------------+---------+-----------+----------+-------+  Subclavian                 Yes        Yes                         +----------+------------+---------+-----------+----------+-------+  Left Findings: +----------+------------+---------+-----------+----------+-----------------+  LEFT       Compressible Phasicity Spontaneous Properties      Summary       +----------+------------+---------+-----------+----------+-----------------+  IJV            Full        Yes        Yes                                   +----------+------------+---------+-----------+----------+-----------------+  Subclavian     Full        Yes        Yes                                   +----------+------------+---------+-----------+----------+-----------------+  Axillary       Full        Yes        Yes                                   +----------+------------+---------+-----------+----------+-----------------+  Brachial     Partial       Yes        Yes                Age Indeterminate  +----------+------------+---------+-----------+----------+-----------------+  Radial         Full                                                         +----------+------------+---------+-----------+----------+-----------------+  Ulnar          Full                                                         +----------+------------+---------+-----------+----------+-----------------+  Cephalic  Full                                                         +----------+------------+---------+-----------+----------+-----------------+  Basilic        Full                                                          +----------+------------+---------+-----------+----------+-----------------+  Summary:  Right: No evidence of thrombosis in the subclavian.  Left: Findings consistent with partial age indeterminate deep vein thrombosis involving the left brachial veins.  *See table(s) above for measurements and observations.  Diagnosing physician: Deitra Mayo MD Electronically signed by Deitra Mayo MD on 03/13/2021 at 5:05:55 PM.    Final      Medications:     Scheduled Medications:  amiodarone  200 mg Oral BID   apixaban  10 mg Oral BID   [START ON 03/20/2021] apixaban  5 mg Oral BID   Chlorhexidine Gluconate Cloth  6 each Topical Daily   dapagliflozin propanediol  10 mg Oral Daily   digoxin  0.125 mg Oral Daily   ivabradine  5 mg Oral BID WC   levothyroxine  100 mcg Oral Q0600   midodrine  10 mg Oral TID WC   potassium chloride  60 mEq Oral Daily   sodium chloride flush  10-40 mL Intracatheter Q12H   sodium chloride flush  3 mL Intravenous Q12H   spironolactone  12.5 mg Oral Daily   sulfamethoxazole-trimethoprim  1 tablet Oral Once per day on Sun Sat   valACYclovir  500 mg Oral BID    Infusions:  sodium chloride     milrinone 0.375 mcg/kg/min (03/14/21 0400)    PRN Medications: sodium chloride, acetaminophen, calcium carbonate, ondansetron (ZOFRAN) IV, sodium chloride flush, sodium chloride flush, temazepam    Patient Profile   SHANDRICKA MONROY is a 66 y.o. 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 Admitted with a/c systolic CHF with low-output.  Assessment/Plan   1. Acute on chronic Systolic HF  - Echo 9/41 EF 55-60%. RV normal - Echo 6/21 EF 20-25%  moderately reduced RV function with severe MR/TR - Cath 06/25/19 no CAD. - Drop in EF felt most likely from R-CHOP - Echo 05/04/20 w/ EF 50-55% G2DD. RV normal - Echo 9/22 at [facility] EF back down to 40% w/ ? LV thrombus -  cMRI 11/22 LVEF 39%, no LV clot - Echo 1/23 [facility]): EF 40-45% - Echo 03/02: EF 20-25%, RV mildly reduced, RVSP 44 mmHg - Direct admit from clinic 03/01 with NYHA IIIb/IV symptoms, ReDs 51%. Had been off HF meds d/t low BP after recent CAR-T therapy and cytokine storm. 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 - Failed milrinone wean. CO-OX improved (68%) on milrinone 0.375 mcg. Start to wean tomorrow.  - CVP 4-5. Stop IV lasix. Start torsemide.20 mg daily - continue digoxin 0.125 mg daily.Dig level 0.6 , stable.  - continue spiro 12.5 mg daily - Increase ivabradine 7.5 mg twice a day. Heart rate 90-100s. - Continue midodrine 10 mg tid. BP higher.  -  No beta blocker with acute decompensation. - Continue farxiga 10 mg daily - No BP room to titrate GDMT - Continue TED hose - Renal function stable.    2. VT + PVCs - Previously had Lifevest. - Supp K and [place] with NSVT runs. Continue amio 200 mg BID -Replace K and Mag.  - Consider LifeVest if recurrent NSVT once off milrinone and lytes corrected   3. Stage IV non-Hodgkin's lymphoma, mantle cell lymphoma   - s/p R-CHOP w/ Neulasta. - Evidence of disease progression on f/u Chest CT 6/22. - Scheduled to have repeat bone marrow bx in 1 mo at [facility] - s/p CAR-T therapy.   4. ST - Continue digoxin + ivabradine.  - Increase ivabradine 7.5 mg twice a day.  - No beta blocker - Now on amiodarone.   5. Hypokalemia/hypomagnesemia - Supp to keep K> 4.0 Mg > 2.0 - Supp K and Mag   6. LUE pain - [place] with partial left brachial DVT - 03/13/21 started on eliquis 10 mg twice a day x 1 week then 5 mg twice a day.   Consult cardiac rehab.    Length of Stay: 7  Amy Clegg, NP  03/14/2021, 7:00 AM  Advanced Heart Failure Team Pager [phone] (M-F; 7a - 5p)  Please contact [facility] for night-coverage after hours (5p -7a ) and weekends on [email or web address]    Patient seen and examined with the above-signed Advanced Practice Provider and/or Housestaff. I  personally reviewed laboratory data, imaging studies and relevant notes. I independently examined the patient and formulated the important aspects of the plan. I have edited the note to reflect any of my changes or salient points. I have personally discussed the plan with the patient and/or family.  Co-ox improved on milrinone 0.375. CVP down. Feeling better. Denies SOB, CP, orthopnea or PND  General:  Well appearing. No resp difficulty HEENT: normal Neck: supple. no JVD. Carotids 2+ bilat; no bruits. No lymphadenopathy or thryomegaly appreciated. Cor: PMI nondisplaced. Regular rate & rhythm. No rubs, gallops or murmurs. Lungs: clear Abdomen: soft, nontender, nondistended. No hepatosplenomegaly. No bruits or masses. Good bowel sounds. Extremities: no cyanosis, clubbing, rash, edema Neuro: alert & orientedx3, cranial nerves grossly intact. moves all 4 extremities w/o difficulty. Affect pleasant  Co-ox improved on higher dose milrinone. CVP ok. Agree with starting milrinone wean again tomorrow. Change diuretics to po. Continue apixaban for DVT.   Glori Bickers, MD  5:11 PM

## 2021-03-15 DIAGNOSIS — I5023 Acute on chronic systolic (congestive) heart failure: Secondary | ICD-10-CM | POA: Diagnosis not present

## 2021-03-15 LAB — BASIC METABOLIC PANEL
Anion gap: 8 (ref 5–15)
BUN: 13 mg/dL (ref 8–23)
CO2: 27 mmol/L (ref 22–32)
Calcium: 9.2 mg/dL (ref 8.9–10.3)
Chloride: 102 mmol/L (ref 98–111)
Creatinine, Ser: 1.27 mg/dL — ABNORMAL HIGH (ref 0.44–1.00)
GFR, Estimated: 47 mL/min — ABNORMAL LOW (ref >60)
Glucose, Bld: 105 mg/dL — ABNORMAL HIGH (ref 70–99)
Potassium: 4.7 mmol/L (ref 3.5–5.1)
Sodium: 137 mmol/L (ref 135–145)

## 2021-03-15 LAB — COOXEMETRY PANEL
Carboxyhemoglobin: 1.6 % — ABNORMAL HIGH (ref 0.5–1.5)
Methemoglobin: 1.9 % — ABNORMAL HIGH (ref 0.0–1.5)
O2 Saturation: 64.7 %
Total hemoglobin: 13.9 g/dL (ref 12.0–16.0)

## 2021-03-15 LAB — MAGNESIUM: Magnesium: 2.5 mg/dL — ABNORMAL HIGH (ref 1.7–2.4)

## 2021-03-15 MED ORDER — TORSEMIDE 20 MG PO TABS
40.0000 mg | ORAL_TABLET | Freq: Once | ORAL | Status: AC
  Administered 2021-03-15: 11:00:00 40 mg via ORAL
  Filled 2021-03-15: qty 2

## 2021-03-15 MED ORDER — POTASSIUM CHLORIDE CRYS ER 20 MEQ PO TBCR
60.0000 meq | EXTENDED_RELEASE_TABLET | Freq: Every day | ORAL | Status: DC
  Administered 2021-03-16: 40 meq via ORAL
  Filled 2021-03-15: qty 3

## 2021-03-15 MED ORDER — TORSEMIDE 20 MG PO TABS
60.0000 mg | ORAL_TABLET | Freq: Every day | ORAL | Status: DC
  Filled 2021-03-15: qty 3

## 2021-03-15 NOTE — Discharge Instructions (Signed)

## 2021-03-15 NOTE — Plan of Care (Signed)
  Problem: Elimination: Goal: Will not experience complications related to bowel motility Outcome: Progressing Goal: Will not experience complications related to urinary retention Outcome: Progressing   Problem: Pain Managment: Goal: General experience of comfort will improve Outcome: Progressing   Problem: Safety: Goal: Ability to remain free from injury will improve Outcome: Progressing   

## 2021-03-15 NOTE — Progress Notes (Addendum)
Patient ID: Natalie Baker, female   DOB: Sep 21, 1955, 66 y.o.   MRN: [id number] ?  ? ? Advanced Heart Failure Rounding Note ? ?PCP-Cardiologist: Elouise Munroe, MD  ? ?Subjective:   ? ?03/01: Direct admit from clinic with a/c CHF ?03/02: Initial co-ox 34% >>started milrinone 0.25 ?03/07: Failed milrinone wean. Increased to 0.375 mcg. Upper extremity with partial DVT. Started on eliquis. Diuresed with IV lasix.  ? ?Co-ox 65% this am on 0.375 milrinone. ? ?CVP 9. On 20 mg Torsemide daily. Weight stable. ? ?No dyspnea or orthopnea. LE edema improved. Nauseated yesterday but feeling better this am. ? ? ?Objective:   ?Weight Range: ?79.6 kg ?Body mass index is 32.1 kg/m?.  ? ?Vital Signs:   ?Temp:  [97.7 ?F (36.5 ?C)-98.2 ?F (36.8 ?C)] 98.2 ?F (36.8 ?C) (03/09 0741) ?Pulse Rate:  [91-109] 109 (03/09 0852) ?Resp:  [16-20] 20 (03/09 0741) ?BP: (101-120)/(5-67) 109/58 (03/09 0741) ?SpO2:  [95 %-97 %] 96 % (03/09 0342) ?Weight:  [79.6 kg] 79.6 kg (03/09 0358) ?Last BM Date : 03/15/21 ? ?Weight change: ?Filed Weights  ? 03/13/21 0301 03/14/21 4098 03/15/21 0358  ?Weight: 81.4 kg 79.4 kg 79.6 kg  ? ? ?Intake/Output:  ? ?Intake/Output Summary (Last 24 hours) at 03/15/2021 1191 ?Last data filed at 03/15/2021 0700 ?Gross per 24 hour  ?Intake 943.7 ml  ?Output 1650 ml  ?Net -706.3 ml  ?  ? ? ?Physical Exam  ? CVP 9 ?General:  Well appearing. No resp difficulty ?HEENT: normal ?Neck: supple. no JVD. Carotids 2+ bilat; no bruits. No lymphadenopathy or thryomegaly appreciated. ?Cor: PMI nondisplaced. Regular rate & rhythm. No rubs, gallops or murmurs. ?Lungs: clear ?Abdomen: soft, nontender, nondistended. No hepatosplenomegaly. No bruits or masses. Good bowel sounds. ?Extremities: no cyanosis, clubbing, rash, edema, + LUE PICC ?Neuro: alert & orientedx3, cranial nerves grossly intact. moves all 4 extremities w/o difficulty. Affect pleasant ? ? ? ? ?Telemetry  ?SR 80s-90s, 2-10 PVCs/min, one 9 beat run NSVT ? ?Labs  ?  ?CBC ?Recent  Labs  ?  03/13/21 ?0921 03/14/21 ?0442  ?WBC 3.4* 2.3*  ?HGB 13.0 12.7  ?HCT 39.7 38.7  ?MCV 96.1 94.2  ?PLT 129* 121*  ? ? ?Basic Metabolic Panel ?Recent Labs  ?  03/14/21 ?0442 03/14/21 ?1215 03/15/21 ?4782  ?NA 134* 131* 137  ?K 3.0* 3.2* 4.7  ?CL 95* 92* 102  ?CO2 '28 30 27  '$ ?GLUCOSE 87 133* 105*  ?BUN '13 11 13  '$ ?CREATININE 1.17* 1.32* 1.27*  ?CALCIUM 8.4* 9.2 9.2  ?MG 1.9  --  2.5*  ? ?Liver Function Tests ?No results for input(s): AST, ALT, ALKPHOS, BILITOT, PROT, ALBUMIN in the last 72 hours. ? ?No results for input(s): LIPASE, AMYLASE in the last 72 hours. ?Cardiac Enzymes ?No results for input(s): CKTOTAL, CKMB, CKMBINDEX, TROPONINI in the last 72 hours. ? ?BNP: ?BNP (last 3 results) ?Recent Labs  ?  03/07/21 ?1500  ?BNP 2,157.9*  ? ? ?ProBNP (last 3 results) ?No results for input(s): PROBNP in the last 8760 hours. ? ? ?D-Dimer ?No results for input(s): DDIMER in the last 72 hours. ?Hemoglobin A1C ?No results for input(s): HGBA1C in the last 72 hours. ?Fasting Lipid Panel ?No results for input(s): CHOL, HDL, LDLCALC, TRIG, CHOLHDL, LDLDIRECT in the last 72 hours. ?Thyroid Function Tests ?No results for input(s): TSH, T4TOTAL, T3FREE, THYROIDAB in the last 72 hours. ? ?Invalid input(s): FREET3 ? ? ?Other results: ? ? ?Imaging  ? ? ?No results found. ? ? ?Medications:   ? ? ?  Scheduled Medications: ? amiodarone  200 mg Oral BID  ? apixaban  10 mg Oral BID  ? [START ON 03/20/2021] apixaban  5 mg Oral BID  ? Chlorhexidine Gluconate Cloth  6 each Topical Daily  ? dapagliflozin propanediol  10 mg Oral Daily  ? digoxin  0.125 mg Oral Daily  ? ivabradine  7.5 mg Oral BID WC  ? levothyroxine  100 mcg Oral Q0600  ? midodrine  10 mg Oral TID WC  ? potassium chloride  60 mEq Oral BID  ? sodium chloride flush  10-40 mL Intracatheter Q12H  ? sodium chloride flush  3 mL Intravenous Q12H  ? spironolactone  12.5 mg Oral Daily  ? sulfamethoxazole-trimethoprim  1 tablet Oral Once per day on Sun Sat  ? torsemide  20 mg Oral Daily   ? valACYclovir  500 mg Oral BID  ? ? ?Infusions: ? sodium chloride    ? milrinone 0.375 mcg/kg/min (03/15/21 0300)  ? ? ?PRN Medications: ?sodium chloride, acetaminophen, calcium carbonate, ondansetron (ZOFRAN) IV, sodium chloride flush, sodium chloride flush, temazepam ? ? ? ?Patient Profile  ? ?Natalie Baker is a 66 y.o. 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 Admitted with a/c systolic CHF with low-output. ? ?Assessment/Plan  ? ?1. Acute on chronic Systolic HF  ?- Echo 3/00 EF 55-60%. RV normal ?- Echo 6/21 EF 20-25%  moderately reduced RV function with severe MR/TR ?- Cath 06/25/19 no CAD. ?- Drop in EF felt most likely from R-CHOP ?- Echo 05/04/20 w/ EF 50-55% G2DD. RV normal ?- Echo 9/22 [facility] EF back down to 40% w/ ? LV thrombus ?- cMRI 11/22 LVEF 39%, no LV clot ?- Echo 1/23 [facility]): EF 40-45% ?- Echo 03/02: EF 20-25%, RV mildly reduced, RVSP 44 mmHg ?- Direct admit from clinic 03/01 with NYHA IIIb/IV symptoms, ReDs 51%. Had been off HF meds d/t low BP after recent CAR-T therapy and cytokine storm. 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 ?- Failed initial milrinone wean. Co-ox 65% on 0.375 milrinone. Will start milrinone wean again today, decrease to 0.25 ?- CVP 9. Increase po Torsemide to 60 mg daily. ?- continue digoxin 0.125 mg daily.Dig level 0.6 on 03/08 ?- continue spiro 12.5 mg daily ?- Continue ivabradine 7.5 mg twice a day.  ?- Continue midodrine 10 mg tid. BP stable. ?- No beta blocker with acute decompensation/low output ?- Continue farxiga 10 mg daily ?- No BP room to titrate GDMT ?- Continue TED hose ?- CR ?  ?2. VT + PVCs ?- Previously had Lifevest. ?- Still with NSVT runs, seems to be settling down. Continue amio 200 mg BID ?- Replace K and Mag as needed with diuresis. ?- Consider LifeVest if recurrent NSVT once off milrinone and lytes corrected ?  ?3. Stage IV non-Hodgkin's lymphoma, mantle cell  lymphoma   ?- s/p R-CHOP w/ Neulasta. ?- Evidence of disease progression on f/u Chest CT 6/22. ?- Scheduled to have repeat bone marrow bx in 1 mo at [facility] ?- s/p CAR-T therapy. ?  ?4. ST ?- Continue digoxin + ivabradine.  ?- Continue ivabradine 7.5 mg twice a day.  ?- No beta blocker ?- Now on amiodarone.  ? ?5. Hypokalemia/hypomagnesemia ?- Supp to keep K> 4.0 Mg > 2.0 ?- K 4.7 and Mag 2.5 ? ?6. LUE pain ?- [place] with partial left brachial DVT ?- 03/13/21 started on eliquis 10 mg twice a day x  1 week then 5 mg twice a day.  ? ? ? ? ?Length of Stay: 8 ? ?FINCH, LINDSAY N, PA-C  ?03/15/2021, 9:22 AM ? ?Advanced Heart Failure Team ?Pager [phone] (M-F; 7a - 5p)  ?Please contact [facility] for night-coverage after hours (5p -7a ) and weekends on [email or web address] ?  ?Patient seen and examined with the above-signed Advanced Practice Provider and/or Housestaff. I personally reviewed laboratory data, imaging studies and relevant notes. I independently examined the patient and formulated the important aspects of the plan. I have edited the note to reflect any of my changes or salient points. I have personally discussed the plan with the patient and/or family. ?  ? ?Remains on milrinone 0.375. Co-ox ok. Had some nausea yesterday but improved. CVP 9. Denies SOB, orthopnea or PND.  ? ?General:  Sitting up  No resp difficulty ?HEENT: normal ?Neck: supple. JVP 9 Carotids 2+ bilat; no bruits. No lymphadenopathy or thryomegaly appreciated. ?Cor: PMI nondisplaced. Regular rate & rhythm. No rubs, gallops or murmurs. ?Lungs: clear ?Abdomen: soft, nontender, nondistended. No hepatosplenomegaly. No bruits or masses. Good bowel sounds. ?Extremities: no cyanosis, clubbing, rash, tr edema ?Neuro: alert & orientedx3, cranial nerves grossly intact. moves all 4 extremities w/o difficulty. Affect pleasant ? ?Agree with retrying milrinone wean. Increase torsemide. Continue to ambulate. May need home inotropes.  ? ?Glori Bickers, MD  ?10:23  AM ? ? ? ? ? ? ?

## 2021-03-16 DIAGNOSIS — I5023 Acute on chronic systolic (congestive) heart failure: Secondary | ICD-10-CM | POA: Diagnosis not present

## 2021-03-16 LAB — BASIC METABOLIC PANEL
Anion gap: 8 (ref 5–15)
BUN: 13 mg/dL (ref 8–23)
CO2: 30 mmol/L (ref 22–32)
Calcium: 9.2 mg/dL (ref 8.9–10.3)
Chloride: 98 mmol/L (ref 98–111)
Creatinine, Ser: 1.47 mg/dL — ABNORMAL HIGH (ref 0.44–1.00)
GFR, Estimated: 39 mL/min — ABNORMAL LOW (ref >60)
Glucose, Bld: 96 mg/dL (ref 70–99)
Potassium: 3.9 mmol/L (ref 3.5–5.1)
Sodium: 136 mmol/L (ref 135–145)

## 2021-03-16 LAB — COOXEMETRY PANEL
Carboxyhemoglobin: 1.8 % — ABNORMAL HIGH (ref 0.5–1.5)
Carboxyhemoglobin: 1.4 % (ref 0.5–1.5)
Methemoglobin: 0.9 % (ref 0.0–1.5)
Methemoglobin: 1 % (ref 0.0–1.5)
O2 Saturation: 57 %
O2 Saturation: 60.6 %
Total hemoglobin: 14.7 g/dL (ref 12.0–16.0)
Total hemoglobin: 13.4 g/dL (ref 12.0–16.0)

## 2021-03-16 LAB — CBC
HCT: 44.5 % (ref 36.0–46.0)
Hemoglobin: 14.6 g/dL (ref 12.0–15.0)
MCH: 31.2 pg (ref 26.0–34.0)
MCHC: 32.8 g/dL (ref 30.0–36.0)
MCV: 95.1 fL (ref 80.0–100.0)
Platelets: 150 10*3/uL (ref 150–400)
RBC: 4.68 MIL/uL (ref 3.87–5.11)
RDW: 19.9 % — ABNORMAL HIGH (ref 11.5–15.5)
WBC: 3.2 10*3/uL — ABNORMAL LOW (ref 4.0–10.5)
nRBC: 0 % (ref 0.0–0.2)

## 2021-03-16 LAB — MAGNESIUM: Magnesium: 1.9 mg/dL (ref 1.7–2.4)

## 2021-03-16 MED ORDER — POTASSIUM CHLORIDE CRYS ER 20 MEQ PO TBCR
40.0000 meq | EXTENDED_RELEASE_TABLET | Freq: Every day | ORAL | Status: DC
  Administered 2021-03-17 – 2021-03-19 (×3): 40 meq via ORAL
  Filled 2021-03-16 (×3): qty 2

## 2021-03-16 MED ORDER — MAGNESIUM SULFATE 2 GM/50ML IV SOLN
2.0000 g | Freq: Once | INTRAVENOUS | Status: AC
  Administered 2021-03-16: 2 g via INTRAVENOUS
  Filled 2021-03-16: qty 50

## 2021-03-16 NOTE — TOC Progression Note (Addendum)
Transition of Care (TOC) - Progression Note  ? ? ?Patient Details  ?Name: Natalie Baker ?MRN: [id number] ?Date of Birth: [birth year] ? ?Transition of Care (TOC) CM/SW Contact  ?Angelita Ingles, RN ?Phone [phone] ? ?03/16/2021, 1:52 PM ? ?Clinical Narrative:    ?CM received message from [facility] CM requesting CM to call [facility] referral to [facility] ([place]). [place] referral has been called and accepted by Corene Cornea with [facility].  ? ? ?Expected Discharge Plan: Home/Self Care ?Barriers to Discharge: Continued Medical Work up ? ?Expected Discharge Plan and Services ?Expected Discharge Plan: Home/Self Care ?  ?Discharge Planning Services: CM Consult ?  ?  ?                ?  ?  ?  ?  ?  ?  ?  ?  ?  ?  ? ? ?Social Determinants of Health (SDOH) Interventions ?  ? ?Readmission Risk Interventions ?No flowsheet data found. ? ?

## 2021-03-16 NOTE — Progress Notes (Signed)
CARDIAC REHAB PHASE I  ? ?PRE:  Rate/Rhythm: 94 SR ? ?  BP: sitting 115/63 ? ?  SaO2: 96 RA ? ?MODE:  Ambulation: 1400 ft  ? ?POST:  Rate/Rhythm: 105 ST ? ?  BP: sitting 123/63  ? ?  SaO2: 96 RA ? ?Tolerated well, no c/o. Thankful to walk. No noted ectopy. ?[phone]  ? ?[place], ACSM ?03/16/2021 ?8:50 AM ? ? ? ? ?

## 2021-03-16 NOTE — Plan of Care (Signed)

## 2021-03-16 NOTE — Progress Notes (Addendum)
Patient ID: Natalie Baker, female   DOB: 02/18/1955, 66 y.o.   MRN: [id number] ?  ? ? Advanced Heart Failure Rounding Note ? ?PCP-Cardiologist: Elouise Munroe, MD  ? ?Subjective:   ? ?03/01: Direct admit from clinic with a/c CHF ?03/02: Initial co-ox 34% >>started milrinone 0.25 ?03/07: Failed milrinone wean. Increased to 0.375 mcg. Upper extremity with partial DVT. Started on eliquis. Diuresed with IV lasix.  ?03/09: Milrinone decreased to 0.25 ? ?Coox 57% this am. Recheck pending. ? ?Diuresed well with PO torsemide. Weight down another 3 lb. CVP 3. ? ?Scr up slightly, 1.27>1.47 ? ?Reports more fatigue today, had trouble sleeping. Ambulated halls with CR this am. No dyspnea, orthopnea or PND. ? ? ?Objective:   ?Weight Range: ?78.1 kg ?Body mass index is 31.49 kg/m?.  ? ?Vital Signs:   ?Temp:  [97.7 ?F (36.5 ?C)-98.2 ?F (36.8 ?C)] 97.7 ?F (36.5 ?C) (03/10 0800) ?Pulse Rate:  [84-100] 100 (03/10 0800) ?Resp:  [15-20] 20 (03/10 0800) ?BP: (98-123)/(54-67) 123/63 (03/10 0800) ?SpO2:  [94 %-99 %] 95 % (03/10 0800) ?Weight:  [78.1 kg] 78.1 kg (03/10 0345) ?Last BM Date : 03/15/21 ? ?Weight change: ?Filed Weights  ? 03/14/21 0613 03/15/21 0358 03/16/21 0345  ?Weight: 79.4 kg 79.6 kg 78.1 kg  ? ? ?Intake/Output:  ? ?Intake/Output Summary (Last 24 hours) at 03/16/2021 0928 ?Last data filed at 03/16/2021 0353 ?Gross per 24 hour  ?Intake 158.34 ml  ?Output 2050 ml  ?Net -1891.66 ml  ?  ? ? ?Physical Exam  ?CVP 3 ?General:  No distress. Lying in bed. ?HEENT: + alopecia ?Neck: supple. no JVD. Carotids 2+ bilat; no bruits.  ?Cor: PMI nondisplaced. Regular rate & rhythm. No rubs, gallops or murmurs. ?Lungs: clear ?Abdomen: soft, nontender, nondistended. No hepatosplenomegaly.  ?Extremities: no cyanosis, clubbing, rash, edema, + LUE PICC ?Neuro: alert & orientedx3, cranial nerves grossly intact. moves all 4 extremities w/o difficulty. Affect pleasant ? ? ? ? ? ?Telemetry  ?SR 80s-90s, 2-5 PVCs/min ? ?Labs  ?  ?CBC ?Recent Labs   ?  03/14/21 ?0442 03/16/21 ?0350  ?WBC 2.3* 3.2*  ?HGB 12.7 14.6  ?HCT 38.7 44.5  ?MCV 94.2 95.1  ?PLT 121* 150  ? ? ?Basic Metabolic Panel ?Recent Labs  ?  03/15/21 ?6789 03/16/21 ?0350  ?NA 137 136  ?K 4.7 3.9  ?CL 102 98  ?CO2 27 30  ?GLUCOSE 105* 96  ?BUN 13 13  ?CREATININE 1.27* 1.47*  ?CALCIUM 9.2 9.2  ?MG 2.5* 1.9  ? ?Liver Function Tests ?No results for input(s): AST, ALT, ALKPHOS, BILITOT, PROT, ALBUMIN in the last 72 hours. ? ?No results for input(s): LIPASE, AMYLASE in the last 72 hours. ?Cardiac Enzymes ?No results for input(s): CKTOTAL, CKMB, CKMBINDEX, TROPONINI in the last 72 hours. ? ?BNP: ?BNP (last 3 results) ?Recent Labs  ?  03/07/21 ?1500  ?BNP 2,157.9*  ? ? ?ProBNP (last 3 results) ?No results for input(s): PROBNP in the last 8760 hours. ? ? ?D-Dimer ?No results for input(s): DDIMER in the last 72 hours. ?Hemoglobin A1C ?No results for input(s): HGBA1C in the last 72 hours. ?Fasting Lipid Panel ?No results for input(s): CHOL, HDL, LDLCALC, TRIG, CHOLHDL, LDLDIRECT in the last 72 hours. ?Thyroid Function Tests ?No results for input(s): TSH, T4TOTAL, T3FREE, THYROIDAB in the last 72 hours. ? ?Invalid input(s): FREET3 ? ? ?Other results: ? ? ?Imaging  ? ? ?No results found. ? ? ?Medications:   ? ? ?Scheduled Medications: ? amiodarone  200 mg Oral  BID  ? apixaban  10 mg Oral BID  ? [START ON 03/20/2021] apixaban  5 mg Oral BID  ? Chlorhexidine Gluconate Cloth  6 each Topical Daily  ? dapagliflozin propanediol  10 mg Oral Daily  ? digoxin  0.125 mg Oral Daily  ? ivabradine  7.5 mg Oral BID WC  ? levothyroxine  100 mcg Oral Q0600  ? midodrine  10 mg Oral TID WC  ? potassium chloride  60 mEq Oral Daily  ? sodium chloride flush  10-40 mL Intracatheter Q12H  ? sodium chloride flush  3 mL Intravenous Q12H  ? spironolactone  12.5 mg Oral Daily  ? sulfamethoxazole-trimethoprim  1 tablet Oral Once per day on Sun Sat  ? torsemide  60 mg Oral Daily  ? valACYclovir  500 mg Oral BID  ? ? ?Infusions: ? sodium  chloride    ? milrinone 0.25 mcg/kg/min (03/16/21 4235)  ? ? ?PRN Medications: ?sodium chloride, acetaminophen, calcium carbonate, ondansetron (ZOFRAN) IV, sodium chloride flush, sodium chloride flush, temazepam ? ? ? ?Patient Profile  ? ?Natalie Baker is a 66 y.o. 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 Admitted with a/c systolic CHF with low-output. ? ?Assessment/Plan  ? ?1. Acute on chronic Systolic HF  ?- Echo 3/61 EF 55-60%. RV normal ?- Echo 6/21 EF 20-25%  moderately reduced RV function with severe MR/TR ?- Cath 06/25/19 no CAD. ?- Drop in EF felt most likely from R-CHOP ?- Echo 05/04/20 w/ EF 50-55% G2DD. RV normal ?- Echo 9/22 at [facility] EF back down to 40% w/ ? LV thrombus ?- cMRI 11/22 LVEF 39%, no LV clot ?- Echo 1/23 [facility]): EF 40-45% ?- Echo 03/02: EF 20-25%, RV mildly reduced, RVSP 44 mmHg ?- Direct admit from clinic 03/01 with NYHA IIIb/IV symptoms, ReDs 51%. Had been off HF meds d/t low BP after recent CAR-T therapy and cytokine storm. 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 ?- Failed initial milrinone wean. Co-ox 65%>57% this am after decreasing milrinone to 0.25. Recheck pending.  ?- CVP 3. Hold Torsemide. Scr up slightly to 1.47, ? d/t overdiuresis +/- decreasing milrinone. ?- continue digoxin 0.125 mg daily.Dig level 0.6 on 03/08 ?- continue spiro 12.5 mg daily ?- Continue ivabradine 7.5 mg twice a day.  ?- Continue midodrine 10 mg tid. BP stable. ?- No beta blocker with acute decompensation/low output ?- Continue farxiga 10 mg daily ?- No BP room to titrate GDMT ?- Continue TED hose ?- CR ?- Worry may need home inotrope. Will reach out to Carolynn Sayers, RN with [facility] home infusion. ?  ?2. VT + PVCs ?- Previously had Lifevest. ?- Less frequent NSVT and PVCs overnight. Continue amio 200 mg BID ?- Replace K and Mag as needed with diuresis. ?- Consider LifeVest if recurrent NSVT once off milrinone and  lytes corrected ?  ?3. Stage IV non-Hodgkin's lymphoma, mantle cell lymphoma   ?- s/p R-CHOP w/ Neulasta. ?- Evidence of disease progression on f/u Chest CT 6/22. ?- Scheduled to have repeat bone marrow bx in 1 mo at [facility] ?- s/p CAR-T therapy. ?  ?4. ST ?- Continue digoxin + ivabradine.  ?- Continue ivabradine 7.5 mg twice a day.  ?- No beta blocker ?- Now on amiodarone.  ?- Rate improved. ? ?5. Hypokalemia/hypomagnesemia ?- Supp to keep K> 4.0 Mg > 2.0 ?- K 3.9 and Mag 1.9. ? ?6. LUE pain ?- [place] with partial left  brachial DVT ?- 03/13/21 started on eliquis 10 mg twice a day x 1 week then 5 mg twice a day.  ? ? ? ?Length of Stay: 9 ? ?FINCH, LINDSAY N, PA-C  ?03/16/2021, 9:28 AM ? ?Advanced Heart Failure Team ?Pager [phone] (M-F; 7a - 5p)  ?Please contact [facility] for night-coverage after hours (5p -7a ) and weekends on [email or web address] ?  ?Patient seen and examined with the above-signed Advanced Practice Provider and/or Housestaff. I personally reviewed laboratory data, imaging studies and relevant notes. I independently examined the patient and formulated the important aspects of the plan. I have edited the note to reflect any of my changes or salient points. I have personally discussed the plan with the patient and/or family. ? ?Remains on milrinone 0.25 (turned down from 0.375). Feels ok. No CP or SOB. Co-ox 60%. CVP 3. SCr rising.  ? ?General:  Well appearing. No resp difficulty ?HEENT: normal ?Neck: supple. no JVD. Carotids 2+ bilat; no bruits. No lymphadenopathy or thryomegaly appreciated. ?Cor: PMI nondisplaced. Regular tachy No rubs, gallops or murmurs. ?Lungs: clear ?Abdomen: soft, nontender, nondistended. No hepatosplenomegaly. No bruits or masses. Good bowel sounds. ?Extremities: no cyanosis, clubbing, rash, edema ?Neuro: alert & orientedx3, cranial nerves grossly intact. moves all 4 extremities w/o difficulty. Affect pleasant ? ?Agree with decreasing milrinone to 0.125. Hold diuretics. Follow CVP, co-ox and  renal function closely. Avoid hypotension.  ? ?Glori Bickers, MD  ?2:07 PM ? ? ? ? ? ? ? ?

## 2021-03-17 DIAGNOSIS — I5023 Acute on chronic systolic (congestive) heart failure: Secondary | ICD-10-CM | POA: Diagnosis not present

## 2021-03-17 LAB — CBC
HCT: 43.5 % (ref 36.0–46.0)
Hemoglobin: 13.9 g/dL (ref 12.0–15.0)
MCH: 30.6 pg (ref 26.0–34.0)
MCHC: 32 g/dL (ref 30.0–36.0)
MCV: 95.8 fL (ref 80.0–100.0)
Platelets: 148 10*3/uL — ABNORMAL LOW (ref 150–400)
RBC: 4.54 MIL/uL (ref 3.87–5.11)
RDW: 19.8 % — ABNORMAL HIGH (ref 11.5–15.5)
WBC: 2.7 10*3/uL — ABNORMAL LOW (ref 4.0–10.5)
nRBC: 0 % (ref 0.0–0.2)

## 2021-03-17 LAB — BASIC METABOLIC PANEL
Anion gap: 11 (ref 5–15)
BUN: 13 mg/dL (ref 8–23)
CO2: 26 mmol/L (ref 22–32)
Calcium: 9.6 mg/dL (ref 8.9–10.3)
Chloride: 99 mmol/L (ref 98–111)
Creatinine, Ser: 1.23 mg/dL — ABNORMAL HIGH (ref 0.44–1.00)
GFR, Estimated: 49 mL/min — ABNORMAL LOW (ref >60)
Glucose, Bld: 93 mg/dL (ref 70–99)
Potassium: 4 mmol/L (ref 3.5–5.1)
Sodium: 136 mmol/L (ref 135–145)

## 2021-03-17 LAB — MAGNESIUM: Magnesium: 2.2 mg/dL (ref 1.7–2.4)

## 2021-03-17 LAB — COOXEMETRY PANEL
Carboxyhemoglobin: 1.2 % (ref 0.5–1.5)
Methemoglobin: 2.7 % — ABNORMAL HIGH (ref 0.0–1.5)
O2 Saturation: 71.3 %
Total hemoglobin: 14.8 g/dL (ref 12.0–16.0)

## 2021-03-17 NOTE — Plan of Care (Signed)

## 2021-03-17 NOTE — Progress Notes (Signed)
Cardiac Rehab 1040 ?Attempted to get pt to ambulate, she declines states that she has been awake since 3 am. States that she has not been sleeping well for the past 5 nights and she wants to nap. Her daughter is bringing her lunch and she will walk with her after lunch.I encouraged her to ambulate 2-3 times today and tomorrow. We will follow on Monday.  ?

## 2021-03-17 NOTE — Progress Notes (Signed)
Patient ID: Natalie Baker, female   DOB: [birth year], 66 y.o.   MRN: [id number] ?  ? ? Advanced Heart Failure Rounding Note ? ?PCP-Cardiologist: Elouise Munroe, MD  ? ?Subjective:   ? ?03/01: Direct admit from clinic with a/c CHF ?03/02: Initial co-ox 34% >>started milrinone 0.25 ?03/07: Failed milrinone wean. Increased to 0.375 mcg. Upper extremity with partial DVT. Started on eliquis. Diuresed with IV lasix.  ?03/09: Milrinone decreased to 0.25 ? ?Milrinone down to 0.125 Co-ox 71%  Diuretics held SCr 1.5 -> 1.2  CVP 4 ? ?Feels ok. Denies CP, SOB orthopnea or PND.  ? ?Objective:   ?Weight Range: ?78.9 kg ?Body mass index is 31.81 kg/m?.  ? ?Vital Signs:   ?Temp:  [98 ?F (36.7 ?C)-98.4 ?F (36.9 ?C)] 98.4 ?F (36.9 ?C) (03/11 6761) ?Pulse Rate:  [87-90] 87 (03/10 1540) ?Resp:  [14-21] 21 (03/11 0727) ?BP: (95-119)/(51-71) 112/58 (03/11 0727) ?SpO2:  [95 %-98 %] 96 % (03/11 0727) ?Weight:  [78.9 kg] 78.9 kg (03/11 0350) ?Last BM Date : 03/16/21 ? ?Weight change: ?Filed Weights  ? 03/15/21 0358 03/16/21 0345 03/17/21 0350  ?Weight: 79.6 kg 78.1 kg 78.9 kg  ? ? ?Intake/Output:  ? ?Intake/Output Summary (Last 24 hours) at 03/17/2021 0812 ?Last data filed at 03/17/2021 0347 ?Gross per 24 hour  ?Intake --  ?Output 1400 ml  ?Net -1400 ml  ? ?  ? ? ?Physical Exam  ? ?General:  Well appearing. No resp difficulty ?HEENT: normal ?Neck: supple. no JVD. Carotids 2+ bilat; no bruits. No lymphadenopathy or thryomegaly appreciated. ?Cor: PMI nondisplaced. Regular rate & rhythm. No rubs, gallops or murmurs. ?Lungs: clear ?Abdomen: soft, nontender, nondistended. No hepatosplenomegaly. No bruits or masses. Good bowel sounds. ?Extremities: no cyanosis, clubbing, rash, edema ?Neuro: alert & orientedx3, cranial nerves grossly intact. moves all 4 extremities w/o difficulty. Affect pleasant ? ? ?Telemetry  ? ?SR 80-90s Personally reviewed ? ? ?Labs  ?  ?CBC ?Recent Labs  ?  03/16/21 ?0350 03/17/21 ?0543  ?WBC 3.2* 2.7*  ?HGB 14.6 13.9   ?HCT 44.5 43.5  ?MCV 95.1 95.8  ?PLT 150 148*  ? ? ? ?Basic Metabolic Panel ?Recent Labs  ?  03/16/21 ?0350 03/17/21 ?0543  ?NA 136 136  ?K 3.9 4.0  ?CL 98 99  ?CO2 30 26  ?GLUCOSE 96 93  ?BUN 13 13  ?CREATININE 1.47* 1.23*  ?CALCIUM 9.2 9.6  ?MG 1.9 2.2  ? ? ?Liver Function Tests ?No results for input(s): AST, ALT, ALKPHOS, BILITOT, PROT, ALBUMIN in the last 72 hours. ? ?No results for input(s): LIPASE, AMYLASE in the last 72 hours. ?Cardiac Enzymes ?No results for input(s): CKTOTAL, CKMB, CKMBINDEX, TROPONINI in the last 72 hours. ? ?BNP: ?BNP (last 3 results) ?Recent Labs  ?  03/07/21 ?1500  ?BNP 2,157.9*  ? ? ? ?ProBNP (last 3 results) ?No results for input(s): PROBNP in the last 8760 hours. ? ? ?D-Dimer ?No results for input(s): DDIMER in the last 72 hours. ?Hemoglobin A1C ?No results for input(s): HGBA1C in the last 72 hours. ?Fasting Lipid Panel ?No results for input(s): CHOL, HDL, LDLCALC, TRIG, CHOLHDL, LDLDIRECT in the last 72 hours. ?Thyroid Function Tests ?No results for input(s): TSH, T4TOTAL, T3FREE, THYROIDAB in the last 72 hours. ? ?Invalid input(s): FREET3 ? ? ?Other results: ? ? ?Imaging  ? ? ?No results found. ? ? ?Medications:   ? ? ?Scheduled Medications: ? amiodarone  200 mg Oral BID  ? apixaban  10 mg Oral BID  ? [  START ON 03/20/2021] apixaban  5 mg Oral BID  ? Chlorhexidine Gluconate Cloth  6 each Topical Daily  ? dapagliflozin propanediol  10 mg Oral Daily  ? digoxin  0.125 mg Oral Daily  ? ivabradine  7.5 mg Oral BID WC  ? levothyroxine  100 mcg Oral Q0600  ? midodrine  10 mg Oral TID WC  ? potassium chloride  40 mEq Oral Daily  ? sodium chloride flush  10-40 mL Intracatheter Q12H  ? sodium chloride flush  3 mL Intravenous Q12H  ? spironolactone  12.5 mg Oral Daily  ? sulfamethoxazole-trimethoprim  1 tablet Oral Once per day on Sun Sat  ? valACYclovir  500 mg Oral BID  ? ? ?Infusions: ? sodium chloride    ? milrinone 0.125 mcg/kg/min (03/17/21 0525)  ? ? ?PRN Medications: ?sodium chloride,  acetaminophen, calcium carbonate, ondansetron (ZOFRAN) IV, sodium chloride flush, sodium chloride flush, temazepam ? ? ? ?Patient Profile  ? ?SHAQUASHA GERSTEL is a 66 y.o. 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 Admitted with a/c systolic CHF with low-output. ? ?Assessment/Plan  ? ?1. Acute on chronic Systolic HF  ?- Echo 7/51 EF 55-60%. RV normal ?- Echo 6/21 EF 20-25%  moderately reduced RV function with severe MR/TR ?- Cath 06/25/19 no CAD. ?- Drop in EF felt most likely from R-CHOP ?- Echo 05/04/20 w/ EF 50-55% G2DD. RV normal ?- Echo 9/22 at [facility] EF back down to 40% w/ ? LV thrombus ?- cMRI 11/22 LVEF 39%, no LV clot ?- Echo 1/23 [facility]): EF 40-45% ?- Echo 03/02: EF 20-25%, RV mildly reduced, RVSP 44 mmHg ?- Direct admit from clinic 03/01 with NYHA IIIb/IV symptoms, ReDs 51%. Had been off HF meds d/t low BP after recent CAR-T therapy and cytokine storm. 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 ?- Failed initial milrinone wean.  ?- Co-ox 71% this am after decreasing milrinone to 0.125. Will stop milrinone and follow  ?- CVP 4. Hold Torsemide ?- continue digoxin 0.125 mg daily.Dig level 0.6 on 03/08 ?- continue spiro 12.5 mg daily ?- Continue ivabradine 7.5 mg twice a day.  ?- Continue midodrine 10 mg tid. BP stable. ?- No beta blocker with acute decompensation/low output ?- Continue farxiga 10 mg daily ?- Follow over the weekend off milrinone. If co-ox stable can go home.  ?  ?2. VT + PVCs ?- Previously had Lifevest. ?- Less frequent NSVT and PVCs overnight. Continue amio 200 mg BID ?- Replace K and Mag as needed with diuresis. ?  ?3. Stage IV non-Hodgkin's lymphoma, mantle cell lymphoma   ?- s/p R-CHOP w/ Neulasta. ?- Evidence of disease progression on f/u Chest CT 6/22. ?- Scheduled to have repeat bone marrow bx in 1 mo at [facility] ?- s/p CAR-T therapy. ?  ?4. Sinus tach ?- Continue digoxin + ivabradine.  ?- Continue  ivabradine 7.5 mg twice a day.  ?- No beta blocker ?- Now on amiodarone.  ?- Rate improved. ? ?5. Hypokalemia/hypomagnesemia ?- Supp to keep K> 4.0 Mg > 2.0 ? ?6. LUE pain ?- [place] with partial left brachial DVT ?- 03/13/21 started on eliquis 10 mg twice a day x 1 week then 5 mg twice a day.  ? ?7. Leukopenia/thrombocytopenia ?- likely related to recent chemo ? ? ?Length of Stay: 10 ? ?Glori Bickers, MD  ?03/17/2021, 8:12 AM ? ?Advanced Heart Failure Team ?Pager [phone] (M-F; 7a - 5p)  ?Please  contact [facility] for night-coverage after hours (5p -7a ) and weekends on [email or web address] ?  ?Patient seen and examined with the above-signed Advanced Practice Provider and/or Housestaff. I personally reviewed laboratory data, imaging studies and relevant notes. I independently examined the patient and formulated the important aspects of the plan. I have edited the note to reflect any of my changes or salient points. I have personally discussed the plan with the patient and/or family. ? ?Remains on milrinone 0.25 (turned down from 0.375). Feels ok. No CP or SOB. Co-ox 60%. CVP 3. SCr rising.  ? ?General:  Well appearing. No resp difficulty ?HEENT: normal ?Neck: supple. no JVD. Carotids 2+ bilat; no bruits. No lymphadenopathy or thryomegaly appreciated. ?Cor: PMI nondisplaced. Regular tachy No rubs, gallops or murmurs. ?Lungs: clear ?Abdomen: soft, nontender, nondistended. No hepatosplenomegaly. No bruits or masses. Good bowel sounds. ?Extremities: no cyanosis, clubbing, rash, edema ?Neuro: alert & orientedx3, cranial nerves grossly intact. moves all 4 extremities w/o difficulty. Affect pleasant ? ?Agree with decreasing milrinone to 0.125. Hold diuretics. Follow CVP, co-ox and renal function closely. Avoid hypotension.  ? ?Glori Bickers, MD  ?8:12 AM ? ? ? ? ? ? ? ?

## 2021-03-18 DIAGNOSIS — I5023 Acute on chronic systolic (congestive) heart failure: Secondary | ICD-10-CM | POA: Diagnosis not present

## 2021-03-18 LAB — BASIC METABOLIC PANEL
Anion gap: 9 (ref 5–15)
BUN: 12 mg/dL (ref 8–23)
CO2: 24 mmol/L (ref 22–32)
Calcium: 9.2 mg/dL (ref 8.9–10.3)
Chloride: 98 mmol/L (ref 98–111)
Creatinine, Ser: 1.24 mg/dL — ABNORMAL HIGH (ref 0.44–1.00)
GFR, Estimated: 48 mL/min — ABNORMAL LOW (ref >60)
Glucose, Bld: 101 mg/dL — ABNORMAL HIGH (ref 70–99)
Potassium: 4.3 mmol/L (ref 3.5–5.1)
Sodium: 131 mmol/L — ABNORMAL LOW (ref 135–145)

## 2021-03-18 LAB — COOXEMETRY PANEL
Carboxyhemoglobin: 1.7 % — ABNORMAL HIGH (ref 0.5–1.5)
Methemoglobin: 0.8 % (ref 0.0–1.5)
O2 Saturation: 59.6 %
Total hemoglobin: 14.7 g/dL (ref 12.0–16.0)

## 2021-03-18 LAB — MAGNESIUM: Magnesium: 2.1 mg/dL (ref 1.7–2.4)

## 2021-03-18 MED ORDER — TORSEMIDE 20 MG PO TABS
40.0000 mg | ORAL_TABLET | Freq: Every day | ORAL | Status: DC
  Administered 2021-03-18: 40 mg via ORAL
  Filled 2021-03-18: qty 2

## 2021-03-18 NOTE — Progress Notes (Signed)
Patient ID: Natalie Baker, female   DOB: October 15, 1955, 66 y.o.   MRN: [id number] ?  ? ? Advanced Heart Failure Rounding Note ? ?PCP-Cardiologist: Natalie Munroe, MD  ? ?Subjective:   ? ?03/01: Direct admit from clinic with a/c CHF ?03/02: Initial co-ox 34% >>started milrinone 0.25 ?03/07: Failed milrinone wean. Increased to 0.375 mcg. Upper extremity with partial DVT. Started on eliquis. Diuresed with IV lasix.  ?03/09: Milrinone decreased to 0.25 ? ?Milrinone stopped yesterday. Feels ok. Occasional nausea but this has been chronic for her. No CP, SOB, orthopnea or PND ? ?Co-ox 60% CVP 9-10 ? ?Objective:   ?Weight Range: ?78.9 kg ?Body mass index is 31.81 kg/m?.  ? ?Vital Signs:   ?Temp:  [97.2 ?F (36.2 ?C)-98.3 ?F (36.8 ?C)] 98.2 ?F (36.8 ?C) (03/12 0736) ?Pulse Rate:  [81-103] 87 (03/12 0736) ?Resp:  [12-22] 19 (03/12 0736) ?BP: (87-116)/(40-95) 90/51 (03/12 0736) ?SpO2:  [18 %-95 %] 94 % (03/12 0736) ?Weight:  [78.9 kg] 78.9 kg (03/12 0500) ?Last BM Date : 03/16/21 ? ?Weight change: ?Filed Weights  ? 03/16/21 0345 03/17/21 0350 03/18/21 0500  ?Weight: 78.1 kg 78.9 kg 78.9 kg  ? ? ?Intake/Output:  ?No intake or output data in the 24 hours ending 03/18/21 0924 ?  ? ? ?Physical Exam  ? ?General:  Lying in bed. No respiratory difficulty  ?HEENT: normal ?Neck: supple. JVP 8-10 Carotids 2+ bilat; no bruits. No lymphadenopathy or thryomegaly appreciated. ?Cor: PMI nondisplaced. Regular rate & rhythm. No rubs, gallops or murmurs. ?Lungs: clear ?Abdomen: soft, nontender, nondistended. No hepatosplenomegaly. No bruits or masses. Good bowel sounds. ?Extremities: no cyanosis, clubbing, rash, edema ?Neuro: alert & orientedx3, cranial nerves grossly intact. moves all 4 extremities w/o difficulty. Affect pleasant ? ? ? ?Telemetry  ? ?SR 80-90s Personally reviewed ? ? ?Labs  ?  ?CBC ?Recent Labs  ?  03/16/21 ?0350 03/17/21 ?0543  ?WBC 3.2* 2.7*  ?HGB 14.6 13.9  ?HCT 44.5 43.5  ?MCV 95.1 95.8  ?PLT 150 148*  ? ? ? ?Basic  Metabolic Panel ?Recent Labs  ?  03/17/21 ?0543 03/18/21 ?0600  ?NA 136 131*  ?K 4.0 4.3  ?CL 99 98  ?CO2 26 24  ?GLUCOSE 93 101*  ?BUN 13 12  ?CREATININE 1.23* 1.24*  ?CALCIUM 9.6 9.2  ?MG 2.2 2.1  ? ? ?Liver Function Tests ?No results for input(s): AST, ALT, ALKPHOS, BILITOT, PROT, ALBUMIN in the last 72 hours. ? ?No results for input(s): LIPASE, AMYLASE in the last 72 hours. ?Cardiac Enzymes ?No results for input(s): CKTOTAL, CKMB, CKMBINDEX, TROPONINI in the last 72 hours. ? ?BNP: ?BNP (last 3 results) ?Recent Labs  ?  03/07/21 ?1500  ?BNP 2,157.9*  ? ? ? ?ProBNP (last 3 results) ?No results for input(s): PROBNP in the last 8760 hours. ? ? ?D-Dimer ?No results for input(s): DDIMER in the last 72 hours. ?Hemoglobin A1C ?No results for input(s): HGBA1C in the last 72 hours. ?Fasting Lipid Panel ?No results for input(s): CHOL, HDL, LDLCALC, TRIG, CHOLHDL, LDLDIRECT in the last 72 hours. ?Thyroid Function Tests ?No results for input(s): TSH, T4TOTAL, T3FREE, THYROIDAB in the last 72 hours. ? ?Invalid input(s): FREET3 ? ? ?Other results: ? ? ?Imaging  ? ? ?No results found. ? ? ?Medications:   ? ? ?Scheduled Medications: ? amiodarone  200 mg Oral BID  ? apixaban  10 mg Oral BID  ? [START ON 03/20/2021] apixaban  5 mg Oral BID  ? Chlorhexidine Gluconate Cloth  6 each Topical  Daily  ? dapagliflozin propanediol  10 mg Oral Daily  ? digoxin  0.125 mg Oral Daily  ? ivabradine  7.5 mg Oral BID WC  ? levothyroxine  100 mcg Oral Q0600  ? midodrine  10 mg Oral TID WC  ? potassium chloride  40 mEq Oral Daily  ? sodium chloride flush  10-40 mL Intracatheter Q12H  ? sodium chloride flush  3 mL Intravenous Q12H  ? spironolactone  12.5 mg Oral Daily  ? sulfamethoxazole-trimethoprim  1 tablet Oral Once per day on Sun Sat  ? valACYclovir  500 mg Oral BID  ? ? ?Infusions: ? sodium chloride    ? ? ?PRN Medications: ?sodium chloride, acetaminophen, calcium carbonate, ondansetron (ZOFRAN) IV, sodium chloride flush, sodium chloride flush,  temazepam ? ? ? ?Patient Profile  ? ?Natalie Baker is a 66 y.o. 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 Admitted with a/c systolic CHF with low-output. ? ?Assessment/Plan  ? ?1. Acute on chronic Systolic HF  ?- Echo 3/79 EF 55-60%. RV normal ?- Echo 6/21 EF 20-25%  moderately reduced RV function with severe MR/TR ?- Cath 06/25/19 no CAD. ?- Drop in EF felt most likely from R-CHOP ?- Echo 05/04/20 w/ EF 50-55% G2DD. RV normal ?- Echo 9/22 at [facility] EF back down to 40% w/ ? LV thrombus ?- cMRI 11/22 LVEF 39%, no LV clot ?- Echo 1/23 [facility]): EF 40-45% ?- Echo 03/02: EF 20-25%, RV mildly reduced, RVSP 44 mmHg ?- Direct admit from clinic 03/01 with NYHA IIIb/IV symptoms, ReDs 51%. Had been off HF meds d/t low BP after recent CAR-T therapy and cytokine storm. 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 ?- Failed initial milrinone wean. Milrinone stopped again 3/11 ?- Co-ox 60% this am off milrinone  ?- CVP 8-10. Restart torsemide 40 daily ?- continue digoxin 0.125 mg daily.Dig level 0.6 on 03/08 ?- continue spiro 12.5 mg daily ?- Continue ivabradine 7.5 mg twice a day.  ?- Continue midodrine 10 mg tid. BP stable. ?- No beta blocker with acute decompensation/low output ?- Continue farxiga 10 mg daily ?- Follow over the weekend off milrinone. If co-ox stable can go home.  ?  ?2. VT + PVCs ?- Previously had Lifevest. ?- Less frequent NSVT and PVCs. Continue amio 200 mg BID ?- Replace K and Mag as needed with diuresis. ?  ?3. Stage IV non-Hodgkin's lymphoma, mantle cell lymphoma   ?- s/p R-CHOP w/ Neulasta. ?- Evidence of disease progression on f/u Chest CT 6/22. ?- Scheduled to have repeat bone marrow bx in 1 mo at [facility] ?- s/p CAR-T therapy. ?  ?4. Sinus tach ?- Continue digoxin + ivabradine.  ?- Continue ivabradine 7.5 mg twice a day.  ?- No beta blocker ?- Now on amiodarone.  ?- Rate improved. ? ?5. Hypokalemia/hypomagnesemia ?- Supp  to keep K> 4.0 Mg > 2.0 ? ?6. LUE pain ?- [place] with partial left brachial DVT ?- 03/13/21 started on eliquis 10 mg twice a day x 1 week then 5 mg twice a day.  ?- no bleeding ? ?7. Leukopenia/thrombocytopenia ?- likely related to recent chemo ? ? ?Length of Stay: 11 ? ?Glori Bickers, MD  ?03/18/2021, 9:24 AM ? ?Advanced Heart Failure Team ?Pager [phone] (M-F; 7a - 5p)  ?Please contact [facility] for night-coverage after hours (5p -7a ) and weekends on [email or web address] ?  ?Patient seen and examined with the above-signed Advanced Practice Provider  and/or Housestaff. I personally reviewed laboratory data, imaging studies and relevant notes. I independently examined the patient and formulated the important aspects of the plan. I have edited the note to reflect any of my changes or salient points. I have personally discussed the plan with the patient and/or family. ? ?Remains on milrinone 0.25 (turned down from 0.375). Feels ok. No CP or SOB. Co-ox 60%. CVP 3. SCr rising.  ? ?General:  Well appearing. No resp difficulty ?HEENT: normal ?Neck: supple. no JVD. Carotids 2+ bilat; no bruits. No lymphadenopathy or thryomegaly appreciated. ?Cor: PMI nondisplaced. Regular tachy No rubs, gallops or murmurs. ?Lungs: clear ?Abdomen: soft, nontender, nondistended. No hepatosplenomegaly. No bruits or masses. Good bowel sounds. ?Extremities: no cyanosis, clubbing, rash, edema ?Neuro: alert & orientedx3, cranial nerves grossly intact. moves all 4 extremities w/o difficulty. Affect pleasant ? ?Agree with decreasing milrinone to 0.125. Hold diuretics. Follow CVP, co-ox and renal function closely. Avoid hypotension.  ? ?Glori Bickers, MD  ?9:24 AM ? ? ? ? ? ? ? ?

## 2021-03-19 ENCOUNTER — Other Ambulatory Visit (HOSPITAL_COMMUNITY): Payer: Self-pay

## 2021-03-19 ENCOUNTER — Encounter: Payer: Self-pay | Admitting: Internal Medicine

## 2021-03-19 DIAGNOSIS — I5023 Acute on chronic systolic (congestive) heart failure: Secondary | ICD-10-CM | POA: Diagnosis not present

## 2021-03-19 LAB — BASIC METABOLIC PANEL
Anion gap: 10 (ref 5–15)
BUN: 17 mg/dL (ref 8–23)
CO2: 27 mmol/L (ref 22–32)
Calcium: 9.3 mg/dL (ref 8.9–10.3)
Chloride: 97 mmol/L — ABNORMAL LOW (ref 98–111)
Creatinine, Ser: 1.64 mg/dL — ABNORMAL HIGH (ref 0.44–1.00)
GFR, Estimated: 35 mL/min — ABNORMAL LOW (ref >60)
Glucose, Bld: 111 mg/dL — ABNORMAL HIGH (ref 70–99)
Potassium: 4 mmol/L (ref 3.5–5.1)
Sodium: 134 mmol/L — ABNORMAL LOW (ref 135–145)

## 2021-03-19 LAB — COOXEMETRY PANEL
Carboxyhemoglobin: 1.3 % (ref 0.5–1.5)
Methemoglobin: 0.9 % (ref 0.0–1.5)
O2 Saturation: 60 %
Total hemoglobin: 14.7 g/dL (ref 12.0–16.0)

## 2021-03-19 LAB — MAGNESIUM: Magnesium: 1.7 mg/dL (ref 1.7–2.4)

## 2021-03-19 MED ORDER — SPIRONOLACTONE 25 MG PO TABS
12.5000 mg | ORAL_TABLET | Freq: Every day | ORAL | 1 refills | Status: DC
  Filled 2021-03-19: qty 16, 32d supply, fill #0

## 2021-03-19 MED ORDER — DAPAGLIFLOZIN PROPANEDIOL 10 MG PO TABS
10.0000 mg | ORAL_TABLET | Freq: Every day | ORAL | 1 refills | Status: DC
Start: 2021-03-20 — End: 2021-04-27
  Filled 2021-03-19: qty 30, 30d supply, fill #0

## 2021-03-19 MED ORDER — MIDODRINE HCL 5 MG PO TABS
15.0000 mg | ORAL_TABLET | Freq: Three times a day (TID) | ORAL | Status: DC
  Administered 2021-03-19: 15 mg via ORAL
  Filled 2021-03-19: qty 3

## 2021-03-19 MED ORDER — APIXABAN 5 MG PO TABS
ORAL_TABLET | ORAL | 1 refills | Status: DC
Start: 2021-03-19 — End: 2021-03-22
  Filled 2021-03-19: qty 74, 30d supply, fill #0

## 2021-03-19 MED ORDER — TORSEMIDE 20 MG PO TABS: 20.0000 mg | ORAL_TABLET | Freq: Every day | ORAL | Status: DC

## 2021-03-19 MED ORDER — TORSEMIDE 20 MG PO TABS
20.0000 mg | ORAL_TABLET | Freq: Every day | ORAL | 1 refills | Status: DC
  Filled 2021-03-19: qty 30, 30d supply, fill #0

## 2021-03-19 MED ORDER — AMIODARONE HCL 200 MG PO TABS
200.0000 mg | ORAL_TABLET | Freq: Two times a day (BID) | ORAL | 1 refills | Status: DC
  Filled 2021-03-19: qty 60, 30d supply, fill #0

## 2021-03-19 MED ORDER — MAGNESIUM SULFATE 2 GM/50ML IV SOLN
2.0000 g | Freq: Once | INTRAVENOUS | Status: AC
  Administered 2021-03-19: 2 g via INTRAVENOUS
  Filled 2021-03-19: qty 50

## 2021-03-19 MED ORDER — IVABRADINE HCL 7.5 MG PO TABS
7.5000 mg | ORAL_TABLET | Freq: Two times a day (BID) | ORAL | 1 refills | Status: DC
Start: 2021-03-19 — End: 2021-05-11
  Filled 2021-03-19 (×3): qty 60, 30d supply, fill #0

## 2021-03-19 MED ORDER — DIGOXIN 125 MCG PO TABS
0.1250 mg | ORAL_TABLET | Freq: Every day | ORAL | 1 refills | Status: DC
  Filled 2021-03-19: qty 30, 30d supply, fill #0

## 2021-03-19 MED ORDER — MIDODRINE HCL 5 MG PO TABS
15.0000 mg | ORAL_TABLET | Freq: Three times a day (TID) | ORAL | 1 refills | Status: DC
  Filled 2021-03-19: qty 90, 10d supply, fill #0

## 2021-03-19 NOTE — Progress Notes (Signed)
RN went over d/c summary with pt. Belongings with pt and her daughter, including TOC meds. NS transporting pt to private vehicle where pt's daughter will transport pt home. ?

## 2021-03-19 NOTE — TOC Progression Note (Signed)
Transition of Care (TOC) - Progression Note  ? ? ?Patient Details  ?Name: AMERI CAHOON ?MRN: [id number] ?Date of Birth: May 11, 1955 ? ?Transition of Care (TOC) CM/SW Contact  ?Angelita Ingles, RN ?Phone [phone] ? ?03/19/2021, 3:55 PM ? ?Clinical Narrative:    ?Cm received message form [facility] requesting [facility] RN orders. Message sent to Dr. Haroldine Laws and primary nurse Meliton Rattan. MD states that CM should page PA with request and left conversation. CM asked nurse Mickel Baas for advice on PA but no response. CM has reached out to [employer] and [employer]. Awaiting response. CM followed up with Cortlin Polo who confirmed that CM has message the correct PA.  ? ?[place] PA has responded and will enter the [facility] RN order.  ? ? ?Expected Discharge Plan: Home/Self Care ?Barriers to Discharge: Continued Medical Work up ? ?Expected Discharge Plan and Services ?Expected Discharge Plan: Home/Self Care ?  ?Discharge Planning Services: CM Consult ?  ?  ?Expected Discharge Date: 03/19/21               ?  ?  ?  ?  ?  ?  ?  ?  ?  ?  ? ? ?Social Determinants of Health (SDOH) Interventions ?  ? ?Readmission Risk Interventions ?No flowsheet data found. ? ?

## 2021-03-19 NOTE — Progress Notes (Signed)
Pt declines ambulation this am. Sts she has been ambulating in room recently. Reviewed managing HF and discussed exercise guidelines. Pt receptive.  ?[id number] ?Yves Dill CES, ACSM ?10:54 AM ?03/19/2021 ? ?

## 2021-03-19 NOTE — Discharge Summary (Cosign Needed)
Advanced Heart Failure Team  Discharge Summary   Patient ID: Natalie Baker MRN: [id number], DOB/AGE: Jul 24, 1955 66 y.o. Admit date: 03/07/2021 D/C date:     03/19/2021   Primary Discharge Diagnoses:  Acute on chronic systolic CHF VT/PVCs Sinus tachycardia Hypokalemia/hypomagnesemia Left brachial DVT Leukopenia/thrombocytopenia AKI   Hospital Course:    Natalie Baker is a 66 y.o. female with a hx of palpitations thought to be PVCs, HTN, non hodgkins lymphoma treated w/ R-CHOP and systolic HF.   Admitted 6/21 with acute HF. ECHO  EF 20-25% w/ mod-severe MR.  RV moderately reduced. Developed VT and was started on amiodarone and transferred to [facility] for  [facility] which showed normal coronaries, elevated filling pressures w/ low CO c/w cardiogenic shock. CI was 1.7. She was started on milrinone, IV amiodarone and IV Lasix for diuresis.   Echo 9/21: EF 40-45%   On repeat echo 05/04/20 EF had normalized to 50-55% G2DD.    At oncology f/u 6/22,  repeat CT scan unfortunately showed evidence for disease progression with mild increase in the small bilateral axillary lymph nodes and moderate increase in the left supraclavicular as well as ileocolic mesenteric lymph nodes and left external iliac lymph nodes but mild increase in the periaortic retroperitoneal lymph nodes. She started treatment with acalabrutinib 100 mg p.o. twice daily on June 17 2020 and Dr. Julien Nordmann referred her to [facility] for bone marrow transplant evaluation. As part of w/u at [facility], an echocardiogram was obtained and showed drop in LVEF back down to 40% w/ echodensity vs thickening of the left ventricular apex concerning for possible LV thrombus. Subsequently, she has been referred back to [facility] for further evaluation.    Follow up 10/22, stable NYHA II symptoms, losartan increased and cMRI arranged to assess for LV thrombus.    cMRI 11/22 LVEF 39%, no LV thrombus noted.   Follow up 11/22 Not a candidate for stem cell transplant, but  will trial CAR-T therapy soon.   Recently d/c from [facility] after CAR-T therapy, had cytokine storm, taken off all HF meds due to low BP. Echo 1/23 with EF 40-45%.   Direct admitted from HF clinic on 03/07/21 with acute on chronic systolic CHF with low-output. Bedside echo with EF < 20%. PICC line placed. Initial co-ox 34% and she was started on inotrope support with milrinone. Diuresed with IV lasix than transitioned to po torsemide. She failed initial milrinone wean then was eventually titrated off milrinone with stable co-ox. Torsemide decreased to 20 mg daily (starting 03/15) at discharge d/t AKI and low volume. Scr 1.24>>1.64 (baseline 1.0). She was started on GDMT. Had frequent PVCs and runs of NSVT which improved with addition of po amiodarone. Course also complicated by left brachial DVT for which she was started on Eliquis.   Hospital Course by Problem: 1. Acute on chronic Systolic HF  - Echo 3/54 EF 55-60%. RV normal - Echo 6/21 EF 20-25%  moderately reduced RV function with severe MR/TR - Cath 06/25/19 no CAD. - Drop in EF felt most likely from R-CHOP - Echo 05/04/20 w/ EF 50-55% G2DD. RV normal - Echo 9/22 at [facility] EF back down to 40% w/ ? LV thrombus - cMRI 11/22 LVEF 39%, no LV clot - Echo 1/23 [facility] EF 40-45% - Echo 03/02: EF 20-25%, RV mildly reduced, RVSP 44 mmHg - Direct admit from clinic 03/01 with NYHA IIIb/IV symptoms, ReDs 51%. Had been off HF meds d/t low BP after recent CAR-T therapy and cytokine storm. 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 - Failed initial milrinone wean. Milrinone stopped again 3/11 - Co-ox 60% this am off milrinone  - CVP 4-5. With AKI, decreased torsemide to 20 mg daily at discharge (starting 03/15) - continue digoxin 0.125 mg daily. Dig level 0.6 on 03/08 - continue spiro 12.5 mg daily - Continue ivabradine 7.5 mg twice a day.  - Increase midodrine to 15 mg tid to help BP - No beta blocker with acute decompensation/low output - Continue farxiga 10 mg daily   2.  VT + PVCs - Previously had Lifevest. - Less frequent NSVT and PVCs. Continue amio 200 mg BID   3. Stage IV non-Hodgkin's lymphoma, mantle cell lymphoma   - s/p R-CHOP w/ Neulasta. - Evidence of disease progression on f/u Chest CT 6/22. - Scheduled to have repeat bone marrow bx in 1 mo at [facility] - s/p CAR-T therapy.   4. Sinus tach - Continue digoxin + ivabradine.  - Continue ivabradine 7.5 mg twice a day.  - No beta blocker - Now on amiodarone.  - Rate improved.   5. Hypokalemia/hypomagnesemia - Supp to keep K> 4.0 Mg > 2.0 - Supplemented.    6. LUE pain - [place] with partial left brachial DVT - 03/13/21 started on eliquis 10 mg twice a day x 1 week then 5 mg twice a day.  - no bleeding   7. Leukopenia/thrombocytopenia - likely related to recent chemo   8. AKI - SCr bump from 1.24>>1.64 today  - prior baseline SCr ~1.0  - Co-ox 60%, CVP 4-5. SBPs 90s - Increase Midodrine to 15 tid to support BP - Will dose reduce torsemide to 20 mg daily   Will discharge home today with home health. No PT/OT needs identified.  Has f/u in HF clinic scheduled later this week.   Discharge Weight Range: 194>>173 lb Discharge Vitals: Blood pressure 109/79, pulse 88, temperature (!) 97.2 F (36.2 C), temperature source Oral, resp. rate 15, height '5\' 2"'$  (1.575 m), weight 78.5 kg, SpO2 95 %.  Labs: Lab Results  Component Value Date   WBC 2.7 (L) 03/17/2021   HGB 13.9 03/17/2021   HCT 43.5 03/17/2021   MCV 95.8 03/17/2021   PLT 148 (L) 03/17/2021    Recent Labs  Lab 03/19/21 0630  NA 134*  K 4.0  CL 97*  CO2 27  BUN 17  CREATININE 1.64*  CALCIUM 9.3  GLUCOSE 111*   Lab Results  Component Value Date   CHOL 202 (H) 12/12/2020   HDL 32 (L) 12/12/2020   LDLCALC 144 (H) 12/12/2020   TRIG 133 12/12/2020   BNP (last 3 results) Recent Labs    03/07/21 1500  BNP 2,157.9*    ProBNP (last 3 results) No results for input(s): PROBNP in the last 8760 hours.   Diagnostic  Studies/Procedures   Echo, 03/08/21: IMPRESSIONS   1. Left ventricular ejection fraction, by estimation, is 20 to 25%. The left ventricle has severely decreased function. The left ventricle demonstrates global hypokinesis. Indeterminate diastolic filling due to E-A fusion. LV filling pressure is elevated.   2. Right ventricular systolic function is mildly reduced. The right ventricular size is normal. There is mildly elevated pulmonary artery systolic pressure. The estimated right ventricular systolic pressure is 17.0 mmHg.   3. A small pericardial effusion is present. The pericardial effusion is posterior to the left ventricle. There is no evidence of cardiac tamponade. Bilateral pleural effusions are noted.   4. The mitral valve was not well visualized. Trivial mitral  valve regurgitation.   5. The aortic valve was not well visualized. Aortic valve regurgitation is not visualized.   6. The inferior vena cava is normal in size with <50% respiratory variability, suggesting right atrial pressure of 8 mmHg.   Venous duplex LUE, 03/13/21: Summary:  Right:  No evidence of thrombosis in the subclavian.     Left:  Findings consistent with partial age indeterminate deep vein thrombosis  involving the left brachial veins.     Discharge Medications   Allergies as of 03/19/2021       Reactions   Other    Chemo from Jan - May 2021 caused acute heart failure    Ppd [tuberculin Purified Protein Derivative] Swelling   Positive reaction.  Negative Chest Xray 06/16/12        Medication List     STOP taking these medications    metoprolol succinate 25 MG 24 hr tablet Commonly known as: TOPROL-XL       TAKE these medications    amiodarone 200 MG tablet Commonly known as: PACERONE Take 1 tablet (200 mg total) by mouth 2 (two) times daily.   cholecalciferol 25 MCG (1000 UNIT) tablet Commonly known as: VITAMIN D3 Take 1,000 Units by mouth daily.   Corlanor 7.5 MG Tabs tablet Generic  drug: ivabradine Take 1 tablet (7.5 mg total) by mouth 2 (two) times daily with a meal.   digoxin 0.125 MG tablet Commonly known as: LANOXIN Take 1 tablet (0.125 mg total) by mouth daily. Start taking on: March 20, 2021   Eliquis 5 MG Tabs tablet Generic drug: apixaban Take 2 tablets (10 mg) on 03/13 evening and 03/14 morning, then take 1 tablet (5 mg) twice a day   estradiol 0.05 MG/24HR patch Commonly known as: VIVELLE-DOT Place 1 patch onto the skin 2 (two) times a week.   Farxiga 10 MG Tabs tablet Generic drug: dapagliflozin propanediol Take 1 tablet (10 mg total) by mouth daily. Start taking on: March 20, 2021   levothyroxine 100 MCG tablet Commonly known as: SYNTHROID TAKE 1 TABLET BY MOUTH DAILY ON AN EMPTY STOMACH WITH ONLY WATER FOR 30 MINUTES & NO ANTACID/CALCIUM/MAGNESIUM FOR 4 HOURS & AVOID BIOTIN   midodrine 5 MG tablet Commonly known as: PROAMATINE Take 3 tablets (15 mg total) by mouth 3 (three) times daily with meals.   prochlorperazine 10 MG tablet Commonly known as: COMPAZINE Take 1 tablet (10 mg total) by mouth every 6 (six) hours as needed for nausea or vomiting.   spironolactone 25 MG tablet Commonly known as: ALDACTONE Take 1/2 tablet (12.5 mg total) by mouth daily. Start taking on: March 20, 2021   sulfamethoxazole-trimethoprim 800-160 MG tablet Commonly known as: BACTRIM DS Take 1 tablet by mouth 2 (two) times a week. Saturday and Sunday   temazepam 30 MG capsule Commonly known as: RESTORIL Take 1 capsule (30 mg total) by mouth at bedtime as needed for sleep.   torsemide 20 MG tablet Commonly known as: DEMADEX Take 1 tablet (20 mg total) by mouth daily. Start taking on: March 21, 2021   valACYclovir 500 MG tablet Commonly known as: VALTREX Take 500 mg by mouth 2 (two) times daily.               Durable Medical Equipment  (From admission, onward)           Start     Ordered   03/19/21 1601  Heart failure home health orders   (Heart failure home health orders /  Face to face)  Once       Comments: Heart Failure Follow-up Care:   RN  Verify follow-up appointments per Patient Discharge Instructions. Confirm transportation arranged. Reconcile home medications with discharge medication list. Remove discontinued medications from use. Assist patient/caregiver to manage medications using pill box. Reinforce low sodium food selection Assessments: Vital signs and oxygen saturation at each visit. Assess home environment for safety concerns, caregiver support and availability of low-sodium foods. Consult Education officer, museum, PT/OT, Dietitian, and CNA based on assessments. Perform comprehensive cardiopulmonary assessment. Notify MD for any change in condition or weight gain of 3 pounds in one day or 5 pounds in one week with symptoms. Daily Weights and Symptom Monitoring: Ensure patient has access to scales. Teach patient/caregiver to weigh daily before breakfast and after voiding using same scale and record.    Teach patient/caregiver to track weight and symptoms and when to notify Provider. Activity: Develop individualized activity plan with patient/caregiver.  Question Answer Comment  Heart Failure Follow-up Care Advanced Heart Failure (AHF) Clinic at [phone]   Obtain the following labs Basic Metabolic Panel   Lab frequency Weekly   Fax lab results to AHF Clinic at [phone]   Diet Low Sodium Heart Healthy   Fluid restrictions: 2000 mL Fluid      03/19/21 1603            Disposition   The patient will be discharged in stable condition to home. Discharge Instructions     (HEART FAILURE PATIENTS) Call MD:  Anytime you have any of the following symptoms: 1) 3 pound weight gain in 24 hours or 5 pounds in 1 week 2) shortness of breath, with or without a dry hacking cough 3) swelling in the hands, feet or stomach 4) if you have to sleep on extra pillows at night in order to breathe.   Complete by: As directed     Amb Referral to Cardiac Rehabilitation   Complete by: As directed    Diagnosis: Heart Failure (see criteria below if ordering Phase II)   Heart Failure Type: Chronic Systolic & Diastolic   After initial evaluation and assessments completed: Virtual Based Care may be provided alone or in conjunction with Phase 2 Cardiac Rehab based on patient barriers.: Yes   Diet - low sodium heart healthy   Complete by: As directed    Increase activity slowly   Complete by: As directed    PICC line removal   Complete by: As directed    STOP any activity that causes chest pain, shortness of breath, dizziness, sweating, or exessive weakness   Complete by: As directed        Follow-up Information     [facility] Follow up.   Why: Your home health has been set up with [facility]. The office will contact you with start of care information. If you have any questions please call the number listed above. Contact information: [place] [address] [facility] Follow up on 03/22/2021.   Specialty: Cardiology Why: Advanced Heart Failure Clinic at [facility] 8:30 am Entrance C, Garage Code 5974 Contact information: [address] [id number] [place] [phone]                  Duration of Discharge Encounter: Greater than 35 minutes   Signed, [facility], Mouhamed Glassco N  03/19/2021, 5:09 PM

## 2021-03-19 NOTE — Progress Notes (Addendum)
Patient ID: Natalie Baker, female   DOB: 1955-10-27, 66 y.o.   MRN: [id number]     Advanced Heart Failure Rounding Note  PCP-Cardiologist: Elouise Munroe, MD   Subjective:    03/01: Direct admit from clinic with a/c CHF 03/02: Initial co-ox 34% >>started milrinone 0.25 03/07: Failed milrinone wean. Increased to 0.375 mcg. Upper extremity with partial DVT. Started on eliquis. Diuresed with IV lasix.  03/09: Milrinone decreased to 0.25 03/11: Milrinone stopped   Co-ox remains marginal but stable off milrinone at 60%.   2.7L in UOP yesterday w/ torsemide. CVP 4-5   AKI, SCr 1.24>>1.64. BPs have been soft, 03K systolic   Mg 1.7 K 4.0   Feels ok this morning. Denies dyspnea. No orthopnea. Not SOB walking around room. Did not sleep well last night. Reports bed is uncomfortable.   Objective:   Weight Range: 78.5 kg Body mass index is 31.65 kg/m.   Vital Signs:   Temp:  [97.4 F (36.3 C)-98.9 F (37.2 C)] 97.7 F (36.5 C) (03/13 0444) Pulse Rate:  [73-92] 73 (03/13 0444) Resp:  [15-20] 17 (03/13 0444) BP: (90-113)/(34-68) 92/34 (03/13 0444) SpO2:  [94 %-96 %] 95 % (03/13 0444) Weight:  [78.5 kg] 78.5 kg (03/13 0444) Last BM Date : 03/18/21  Weight change: Filed Weights   03/17/21 0350 03/18/21 0500 03/19/21 0444  Weight: 78.9 kg 78.9 kg 78.5 kg    Intake/Output:   Intake/Output Summary (Last 24 hours) at 03/19/2021 0729 Last data filed at 03/18/2021 1800 Gross per 24 hour  Intake 480 ml  Output 2700 ml  Net -2220 ml      Physical Exam   CVP 4-5  General:  Well appearing. No respiratory difficulty HEENT: normal Neck: supple. JVD not elevated. Carotids 2+ bilat; no bruits. No lymphadenopathy or thyromegaly appreciated. Cor: PMI nondisplaced. Regular rate & rhythm. No rubs, gallops or murmurs. Lungs: clear Abdomen: soft, nontender, nondistended. No hepatosplenomegaly. No bruits or masses. Good bowel sounds. Extremities: no cyanosis, clubbing, rash, edema  + RUE PICC  Neuro: alert & oriented x 3, cranial nerves grossly intact. moves all 4 extremities w/o difficulty. Affect pleasant.    Telemetry   NSR, 90s. Personally reviewed   Labs    CBC Recent Labs    03/17/21 0543  WBC 2.7*  HGB 13.9  HCT 43.5  MCV 95.8  PLT 148*    Basic Metabolic Panel Recent Labs    03/18/21 0600 03/19/21 0630  NA 131* 134*  K 4.3 4.0  CL 98 97*  CO2 24 27  GLUCOSE 101* 111*  BUN 12 17  CREATININE 1.24* 1.64*  CALCIUM 9.2 9.3  MG 2.1 1.7   Liver Function Tests No results for input(s): AST, ALT, ALKPHOS, BILITOT, PROT, ALBUMIN in the last 72 hours.  No results for input(s): LIPASE, AMYLASE in the last 72 hours. Cardiac Enzymes No results for input(s): CKTOTAL, CKMB, CKMBINDEX, TROPONINI in the last 72 hours.  BNP: BNP (last 3 results) Recent Labs    03/07/21 1500  BNP 2,157.9*    ProBNP (last 3 results) No results for input(s): PROBNP in the last 8760 hours.   D-Dimer No results for input(s): DDIMER in the last 72 hours. Hemoglobin A1C No results for input(s): HGBA1C in the last 72 hours. Fasting Lipid Panel No results for input(s): CHOL, HDL, LDLCALC, TRIG, CHOLHDL, LDLDIRECT in the last 72 hours. Thyroid Function Tests No results for input(s): TSH, T4TOTAL, T3FREE, THYROIDAB in the last 72 hours.  Invalid  input(s): FREET3   Other results:   Imaging    No results found.   Medications:     Scheduled Medications:  amiodarone  200 mg Oral BID   apixaban  10 mg Oral BID   [START ON 03/20/2021] apixaban  5 mg Oral BID   Chlorhexidine Gluconate Cloth  6 each Topical Daily   dapagliflozin propanediol  10 mg Oral Daily   digoxin  0.125 mg Oral Daily   ivabradine  7.5 mg Oral BID WC   levothyroxine  100 mcg Oral Q0600   midodrine  10 mg Oral TID WC   potassium chloride  40 mEq Oral Daily   sodium chloride flush  10-40 mL Intracatheter Q12H   sodium chloride flush  3 mL Intravenous Q12H   spironolactone  12.5 mg  Oral Daily   sulfamethoxazole-trimethoprim  1 tablet Oral Once per day on Sun Sat   torsemide  40 mg Oral Daily   valACYclovir  500 mg Oral BID    Infusions:  sodium chloride      PRN Medications: sodium chloride, acetaminophen, calcium carbonate, ondansetron (ZOFRAN) IV, sodium chloride flush, sodium chloride flush, temazepam    Patient Profile   KATHE WIRICK is a 66 y.o. 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 Admitted with a/c systolic CHF with low-output.  Assessment/Plan   1. Acute on chronic Systolic HF  - Echo 6/76 EF 55-60%. RV normal - Echo 6/21 EF 20-25%  moderately reduced RV function with severe MR/TR - Cath 06/25/19 no CAD. - Drop in EF felt most likely from R-CHOP - Echo 05/04/20 w/ EF 50-55% G2DD. RV normal - Echo 9/22 at [facility] EF back down to 40% w/ ? LV thrombus - cMRI 11/22 LVEF 39%, no LV clot - Echo 1/23 [facility] EF 40-45% - Echo 03/02: EF 20-25%, RV mildly reduced, RVSP 44 mmHg - Direct admit from clinic 03/01 with NYHA IIIb/IV symptoms, ReDs 51%. Had been off HF meds d/t low BP after recent CAR-T therapy and cytokine storm. 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 - Failed initial milrinone wean. Milrinone stopped again 3/11 - Co-ox 60% this am off milrinone  - CVP 4-5. With AKI, may need to alternate torsemide 40 mg one day, 20 mg the next  - continue digoxin 0.125 mg daily. Dig level 0.6 on 03/08 - continue spiro 12.5 mg daily - Continue ivabradine 7.5 mg twice a day.  - Increase midodrine to 15 mg tid to help BP - No beta blocker with acute decompensation/low output - Continue farxiga 10 mg daily    2. VT + PVCs - Previously had Lifevest. - Less frequent NSVT and PVCs. Continue amio 200 mg BID - Replace K and Mag as needed with diuresis.   3. Stage IV non-Hodgkin's lymphoma, mantle cell lymphoma   - s/p R-CHOP w/ Neulasta. - Evidence of disease  progression on f/u Chest CT 6/22. - Scheduled to have repeat bone marrow bx in 1 mo at [facility] - s/p CAR-T therapy.   4. Sinus tach - Continue digoxin + ivabradine.  - Continue ivabradine 7.5 mg twice a day.  - No beta blocker - Now on amiodarone.  - Rate improved.  5. Hypokalemia/hypomagnesemia - Supp to keep K> 4.0 Mg > 2.0 - Mg 1.7, give 2 g supp   6. LUE pain - [place] with partial left brachial DVT - 03/13/21 started on eliquis 10 mg twice  a day x 1 week then 5 mg twice a day.  - no bleeding  7. Leukopenia/thrombocytopenia - likely related to recent chemo  8. AKI - SCr bump from 1.24>>1.64 today  - prior baseline SCr ~1.0  - Co-ox 60%, CVP 4-5. SBPs 90s - Increase Midodrine to 15 tid to support BP - Will dose reduce torsemide, alternating between 20-40 mg daily    W/ rising SCr, will watch 1 more day. Continue to follow co-ox off milrinone.     Length of Stay: [address], PA-C  03/19/2021, 7:29 AM  Advanced Heart Failure Team Pager [phone] (M-F; 7a - 5p)  Please contact [facility] for night-coverage after hours (5p -7a ) and weekends on [email or web address]    Patient seen and examined with the above-signed Advanced Practice Provider and/or Housestaff. I personally reviewed laboratory data, imaging studies and relevant notes. I independently examined the patient and formulated the important aspects of the plan. I have edited the note to reflect any of my changes or salient points. I have personally discussed the plan with the patient and/or family.  Co-ox stable off milrinone. Feels ok. CVP ok. Scr up 1.2 -> 1.6  Denies CP or SOB  General:  Sitting up in bed  No resp difficulty HEENT: normal Neck: supple. no JVD. Carotids 2+ bilat; no bruits. No lymphadenopathy or thryomegaly appreciated. Cor: PMI nondisplaced. Regular rate & rhythm. No rubs, gallops or murmurs. Lungs: clear Abdomen: soft, nontender, nondistended. No hepatosplenomegaly. No bruits or masses. Good  bowel sounds. Extremities: no cyanosis, clubbing, rash, edema Neuro: alert & orientedx3, cranial nerves grossly intact. moves all 4 extremities w/o difficulty. Affect pleasant  Co-ox stable off milrinone. CVP ok. Scr up a bit. Will watch one more day to ensure stability. Hold diuretics.   Glori Bickers, MD  8:32 AM  Addendum:  After further discussion, patient adamantly wants to go home today. I feel d/c is safe with close f/u on labs.   Glori Bickers, MD  9:40 AM

## 2021-03-21 NOTE — Progress Notes (Signed)
?Advanced Heart Failure Clinic Note  ? ?Referring Physician: ?PCP: [name], MD ?PCP-Cardiologist: Elouise Munroe, MD  ?[facility]: Dr. Haroldine Laws  ? ?Reason for Visit: [facility] F/u for Systolic Heart Failure ? ? ?HPI: ? ?Natalie Baker is a 66 y.o. female with a hx of palpitations thought to be PVCs, HTN, non hodgkins lymphoma treated w/ R-CHOP and systolic HF. ?  ?Admitted 6/21 with acute HF. ECHO  EF 20-25% w/ mod-severe MR.  RV moderately reduced. Developed VT and was started on amiodarone and transferred to [facility] for [facility] which showed normal coronaries, elevated filling pressures w/ low CO c/w cardiogenic shock. CI was 1.7. She was started on milrinone, IV amiodarone and IV Lasix for diuresis. ?  ?Echo 9/21: EF 40-45%  ? ?On repeat echo 05/04/20 EF had normalized to 50-55% G2DD.  ?  ?At oncology f/u 6/22,  repeat CT scan unfortunately showed evidence for disease progression with mild increase in the small bilateral axillary lymph nodes and moderate increase in the left supraclavicular as well as ileocolic mesenteric lymph nodes and left external iliac lymph nodes but mild increase in the periaortic retroperitoneal lymph nodes. She started treatment with acalabrutinib 100 mg p.o. twice daily on June 17 2020 and Dr. Julien Nordmann referred her to [facility] for bone marrow transplant evaluation. As part of w/u at [facility], an echocardiogram was obtained and showed drop in LVEF back down to 40% w/ echodensity vs thickening of the left ventricular apex concerning for possible LV thrombus. Subsequently, she was referred back to [facility] for further evaluation.  ?  ?Had follow up 10/22, and endorsed stable NYHA II symptoms. Losartan was increased and cMRI arranged to assess for LV thrombus.  ?  ?cMRI 11/22 LVEF 39%, no LV thrombus noted. ?  ?Follow up 11/22 Not a candidate for stem cell transplant, but will trial CAR-T therapy soon.  ?  ?Recently d/c from [facility] after CAR-T therapy, had cytokine storm, taken off all HF meds due to  low BP. Echo 1/23 with EF 40-45%.  ?  ?Most recently, she was direct admitted from HF clinic on 03/07/21 with acute on chronic systolic CHF with low-output. Bedside echo with EF < 20%. PICC line placed. Initial co-ox 34% and she was started on inotrope support with milrinone. Diuresed with IV lasix then transitioned to po torsemide. She failed initial milrinone wean then was eventually titrated off milrinone with stable co-ox. Torsemide decreased to 20 mg daily (starting 03/15) at discharge d/t AKI and low volume. Scr 1.24>>1.64 (baseline 1.0). She was started on GDMT. Had frequent PVCs and runs of NSVT which improved with addition of po amiodarone. Course also complicated by left brachial DVT for which she was started on Eliquis. Discharged on 3/13. D/c wt 172 lb.  ? ?She presents to clinic today for post hospital f/u. Here w/ her husband. Daughter is on speaker phone. Doing well. Wt only up 2 lb since d/c, 172>>174 lb. Took all AM meds except for torsemide. ReDs Clip 32%. BP stable, 124/78. EKG shows NSR. No PVCs. HR 70 bpm. Denies significant dyspnea w/ ALDs. No orthopnea or PND. Denies palpitations. No chest pain. No dizziness. Arm pain resolved. No abnormal bleeding w/ eliquis. Continues w/ chronic fatigue. She complains of some mild b/l nipple soreness, that just started a few days ago. No intense pain. She is on low dose spiro but has been on this for a while.  ? ? ? ?Cardiac/Vascular Studies: ?Echo 06/2019 EF 20-25%  moderately reduced RV function  with severe MR/TR ?Echo 9/21: EF 40-45%, RV normal  ?Echo  4/22: EF 50-55% G2DD. RV normal  ?Echo [facility] 10/04/20: EF 40%, grade I DD, RV ok, mild MR, mild LAE, small circumferential pericardial effusion, Echodensity vs thickening of the left ventricular apex, cannot rule out thrombus ?Echo 3/23 LVEF 20-25%, RV mildly reduced. Small pericardial effusion, no tamponade  ?  ?cMRI 11/22 EF 39%, no LV thrombus ?  ?Cath 6/18 with normal cors. Low output. ?Ao = 109/71 (81) ?LV  =  99/23 ?RA =  15 ?RV =  43/18 ?PA =  35/14 (25) ?PCW = 22 ?Fick cardiac output/index = 3.3/1.7 ?PVR = < 1.0 WU ?Ao sat = 100% ?PA sat = 49%, 53%  ?  ?Assessment: ?1. Normal coronary arteries ?2. Severe NICM EF 20% ?3. Elevated filling pressures and low CO c/w cardiogenic shock ?4. Brief run of VT  ? ?Venous duplex LUE, 03/13/21: ?Summary:  ?Right:  ?No evidence of thrombosis in the subclavian.  ?   ?Left:  ?Findings consistent with partial age indeterminate deep vein thrombosis  ?involving the left brachial veins. ? ? ?Review of systems complete and found to be negative unless listed in HPI.   ? ? ?Past Medical History:  ?Diagnosis Date  ? Allergy   ? Cancer [facility])   ? being worked up for [place]  ? CHF (congestive heart failure) ([place])   ? Colon polyp 12/29/2006  ? Dysrhythmia   ? Frozen shoulder 11/16/2015  ? Hyperlipidemia   ? Hypothyroidism   ? Obesity   ? BMI 34  ? Osteoporosis   ? Palpitations   ? Pre-diabetes   ? Vitamin D deficiency   ? ? ?Current Outpatient Medications  ?Medication Sig Dispense Refill  ? amiodarone (PACERONE) 200 MG tablet Take 1 tablet (200 mg total) by mouth 2 (two) times daily. 60 tablet 1  ? apixaban (ELIQUIS) 5 MG TABS tablet Take 2 tablets (10 mg) on 03/13 evening and 03/14 morning, then take 1 tablet (5 mg) twice a day 74 tablet 1  ? cholecalciferol (VITAMIN D3) 25 MCG (1000 UNIT) tablet Take 1,000 Units by mouth daily.    ? dapagliflozin propanediol (FARXIGA) 10 MG TABS tablet Take 1 tablet (10 mg total) by mouth daily. 30 tablet 1  ? digoxin (LANOXIN) 0.125 MG tablet Take 1 tablet (0.125 mg total) by mouth daily. 30 tablet 1  ? estradiol (VIVELLE-DOT) 0.05 MG/24HR patch Place 1 patch onto the skin 2 (two) times a week.    ? ivabradine (CORLANOR) 7.5 MG TABS tablet Take 1 tablet (7.5 mg total) by mouth 2 (two) times daily with a meal. 60 tablet 1  ? levothyroxine (SYNTHROID) 100 MCG tablet TAKE 1 TABLET BY MOUTH DAILY ON AN EMPTY STOMACH WITH ONLY WATER FOR 30 MINUTES & NO  ANTACID/CALCIUM/MAGNESIUM FOR 4 HOURS & AVOID BIOTIN 90 tablet 1  ? midodrine (PROAMATINE) 5 MG tablet Take 3 tablets (15 mg total) by mouth 3 (three) times daily with meals. 90 tablet 1  ? prochlorperazine (COMPAZINE) 10 MG tablet Take 1 tablet (10 mg total) by mouth every 6 (six) hours as needed for nausea or vomiting. 30 tablet 0  ? spironolactone (ALDACTONE) 25 MG tablet Take 1/2 tablet (12.5 mg total) by mouth daily. 16 tablet 1  ? sulfamethoxazole-trimethoprim (BACTRIM DS) 800-160 MG tablet Take 1 tablet by mouth 2 (two) times a week. Saturday and Sunday    ? temazepam (RESTORIL) 30 MG capsule Take 1 capsule (30 mg total) by mouth  at bedtime as needed for sleep. 30 capsule 0  ? torsemide (DEMADEX) 20 MG tablet Take 1 tablet (20 mg total) by mouth daily. 30 tablet 1  ? valACYclovir (VALTREX) 500 MG tablet Take 500 mg by mouth 2 (two) times daily.    ? ?No current facility-administered medications for this encounter.  ? ? ?Allergies  ?Allergen Reactions  ? Other   ?  Chemo from Jan - May 2021 caused acute heart failure   ? Ppd [Tuberculin Purified Protein Derivative] Swelling  ?  Positive reaction.  Negative Chest Xray 06/16/12  ? ? ?  ?Social History  ? ?Socioeconomic History  ? Marital status: Married  ?  Spouse name: Not on file  ? Number of children: 1  ? Years of education: Not on file  ? Highest education level: Not on file  ?Occupational History  ? Occupation: Theatre manager  ?Tobacco Use  ? Smoking status: Former  ?  Years: 15.00  ?  Types: Cigarettes  ?  Quit date: 01/08/1996  ?  Years since quitting: 25.2  ? Smokeless tobacco: Never  ? Tobacco comments:  ?  quit 1998 but is exposed to 2nd hand smoke  ?Vaping Use  ? Vaping Use: Never used  ?Substance and Sexual Activity  ? Alcohol use: No  ? Drug use: No  ? Sexual activity: Yes  ?  Partners: Male  ?  Birth control/protection: Post-menopausal  ?Other Topics Concern  ? Not on file  ?Social History Narrative  ? 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 ?Social Determinants of Health  ? ?Financial Resource Strain: Not on file  ?Food Insecurity: Not on file  ?Transportation Needs: Not on file  ?Physical Activity: Not

## 2021-03-22 ENCOUNTER — Encounter (HOSPITAL_COMMUNITY): Payer: Self-pay

## 2021-03-22 ENCOUNTER — Other Ambulatory Visit (HOSPITAL_COMMUNITY): Payer: Self-pay

## 2021-03-22 ENCOUNTER — Ambulatory Visit (HOSPITAL_COMMUNITY)
Admit: 2021-03-22 | Discharge: 2021-03-22 | Disposition: A | Discharge status: Home / Self Care | Payer: Medicare Other | Attending: Cardiology | Admitting: Cardiology

## 2021-03-22 ENCOUNTER — Telehealth (HOSPITAL_COMMUNITY): Payer: Self-pay

## 2021-03-22 ENCOUNTER — Other Ambulatory Visit: Payer: Self-pay

## 2021-03-22 ENCOUNTER — Telehealth (HOSPITAL_COMMUNITY): Payer: Self-pay | Admitting: Pharmacy Technician

## 2021-03-22 ENCOUNTER — Telehealth (HOSPITAL_COMMUNITY): Payer: Self-pay | Admitting: *Deleted

## 2021-03-22 VITALS — BP 124/78 | HR 75 | Wt 174.0 lb

## 2021-03-22 DIAGNOSIS — D89839 Cytokine release syndrome, grade unspecified: Secondary | ICD-10-CM | POA: Diagnosis not present

## 2021-03-22 DIAGNOSIS — I3139 Other pericardial effusion (noninflammatory): Secondary | ICD-10-CM | POA: Diagnosis not present

## 2021-03-22 DIAGNOSIS — C8310 Mantle cell lymphoma, unspecified site: Secondary | ICD-10-CM | POA: Diagnosis not present

## 2021-03-22 DIAGNOSIS — I959 Hypotension, unspecified: Secondary | ICD-10-CM | POA: Diagnosis not present

## 2021-03-22 DIAGNOSIS — I82622 Acute embolism and thrombosis of deep veins of left upper extremity: Secondary | ICD-10-CM | POA: Diagnosis not present

## 2021-03-22 DIAGNOSIS — Z8249 Family history of ischemic heart disease and other diseases of the circulatory system: Secondary | ICD-10-CM | POA: Insufficient documentation

## 2021-03-22 DIAGNOSIS — N179 Acute kidney failure, unspecified: Secondary | ICD-10-CM | POA: Insufficient documentation

## 2021-03-22 DIAGNOSIS — I110 Hypertensive heart disease with heart failure: Secondary | ICD-10-CM | POA: Insufficient documentation

## 2021-03-22 DIAGNOSIS — Z9221 Personal history of antineoplastic chemotherapy: Secondary | ICD-10-CM | POA: Insufficient documentation

## 2021-03-22 DIAGNOSIS — R9431 Abnormal electrocardiogram [ECG] [EKG]: Secondary | ICD-10-CM | POA: Diagnosis not present

## 2021-03-22 DIAGNOSIS — I472 Ventricular tachycardia, unspecified: Secondary | ICD-10-CM | POA: Insufficient documentation

## 2021-03-22 DIAGNOSIS — I5022 Chronic systolic (congestive) heart failure: Secondary | ICD-10-CM | POA: Diagnosis not present

## 2021-03-22 DIAGNOSIS — I493 Ventricular premature depolarization: Secondary | ICD-10-CM | POA: Insufficient documentation

## 2021-03-22 DIAGNOSIS — I428 Other cardiomyopathies: Secondary | ICD-10-CM | POA: Diagnosis not present

## 2021-03-22 DIAGNOSIS — Z7901 Long term (current) use of anticoagulants: Secondary | ICD-10-CM | POA: Diagnosis not present

## 2021-03-22 DIAGNOSIS — D696 Thrombocytopenia, unspecified: Secondary | ICD-10-CM | POA: Insufficient documentation

## 2021-03-22 DIAGNOSIS — Z79899 Other long term (current) drug therapy: Secondary | ICD-10-CM | POA: Insufficient documentation

## 2021-03-22 DIAGNOSIS — Z7984 Long term (current) use of oral hypoglycemic drugs: Secondary | ICD-10-CM | POA: Diagnosis not present

## 2021-03-22 DIAGNOSIS — R570 Cardiogenic shock: Secondary | ICD-10-CM | POA: Diagnosis not present

## 2021-03-22 LAB — BASIC METABOLIC PANEL
Anion gap: 12 (ref 5–15)
BUN: 18 mg/dL (ref 8–23)
CO2: 25 mmol/L (ref 22–32)
Calcium: 9.6 mg/dL (ref 8.9–10.3)
Chloride: 97 mmol/L — ABNORMAL LOW (ref 98–111)
Creatinine, Ser: 1.73 mg/dL — ABNORMAL HIGH (ref 0.44–1.00)
GFR, Estimated: 32 mL/min — ABNORMAL LOW (ref >60)
Glucose, Bld: 115 mg/dL — ABNORMAL HIGH (ref 70–99)
Potassium: 4.7 mmol/L (ref 3.5–5.1)
Sodium: 134 mmol/L — ABNORMAL LOW (ref 135–145)

## 2021-03-22 LAB — DIGOXIN LEVEL: Digoxin Level: 2.5 ng/mL — ABNORMAL HIGH (ref 0.8–2.0)

## 2021-03-22 MED ORDER — APIXABAN 5 MG PO TABS: 5.0000 mg | ORAL_TABLET | Freq: Two times a day (BID) | ORAL | 6 refills | Status: DC

## 2021-03-22 MED ORDER — AMIODARONE HCL 200 MG PO TABS: 200.0000 mg | ORAL_TABLET | Freq: Every day | ORAL | 6 refills | Status: DC

## 2021-03-22 MED ORDER — TORSEMIDE 20 MG PO TABS
20.0000 mg | ORAL_TABLET | ORAL | 6 refills | Status: DC
Start: 2021-03-22 — End: 2022-03-26
  Filled 2021-03-22: qty 30, 60d supply, fill #0

## 2021-03-22 NOTE — Telephone Encounter (Signed)
Attempted to call patient in regards to Cardiac Rehab - Unable to leave a VM do to VM box being full ?

## 2021-03-22 NOTE — Progress Notes (Signed)
ReDS Vest / Clip - 03/22/21 0800   ? ?  ? ReDS Vest / Clip  ? Station Marker A   ? Ruler Value 30   ? ReDS Value Range Low volume   ? ReDS Actual Value 32   ? ?  ?  ? ?  ? ? ?

## 2021-03-22 NOTE — Telephone Encounter (Signed)
Advanced Heart Failure Patient Advocate Encounter ? ?The patient was approved for a [employer] grant that will help cover the cost of Corlanor. Total amount awarded, $10,000. 02/17/21-02/16/22. ? ?ID [id number] ? ?BIN [id number] ? ?PCN [id number] ? ?Group [id number] ? ?Patient was given a copy of the grant information to take to the pharmacy. ? ?Charlann Boxer, CPhT ? ?

## 2021-03-22 NOTE — Telephone Encounter (Signed)
-----   Message from Consuelo Pandy, [place] sent at 03/22/2021  1:41 PM EDT ----- ?Digoxin level is elevated. Stop Digoxin. Repeat Dig level on Monday. Check to see if any nausea and vomiting ? ?Also SCr elevated. Hold torsemide tomorrow, then reduce down to qod dosing. Repeat BMP on Monday as well.  ?

## 2021-03-22 NOTE — Progress Notes (Signed)
CSW met with pt in clinic to complete SDOH screen.  No SDOH concerns expressed at this time. ? ?Jorge Ny, LCSW ?Clinical Social Worker ?Advanced Heart Failure Clinic ?Desk#: [phone] ?Cell#: [phone] ? ?

## 2021-03-22 NOTE — Patient Instructions (Signed)
DECREASE Amiodarone to '200mg'$  (1 tab) daily ? ?DECREASE Eliquis to '5mg'$  (1 tab) twice a day ? ?Labs today ?We will only contact you if something comes back abnormal or we need to make some changes. ?Otherwise no news is good news! ? ?Your physician recommends that you schedule a follow-up appointment in: 2 weeks with Nurse practitioners and 6 weeks with Dr Haroldine Laws ? ?Please call office at [phone] option 2 if you have any questions or concerns.  ? ?At the [facility], you and your health needs are our priority. As part of our continuing mission to provide you with exceptional heart care, we have created designated Provider Care Teams. These Care Teams include your primary Cardiologist (physician) and Advanced Practice Providers (APPs- Physician Assistants and Nurse Practitioners) who all work together to provide you with the care you need, when you need it.  ? ?You may see any of the following providers on your designated Care Team at your next follow up: ?Dr Glori Bickers ?Dr Loralie Champagne ?Darrick Grinder, NP ?Lyda Jester, PA ?Jessica Milford,NP ?Marlyce Huge, PA ?Audry Riles, PharmD ? ? ?Please be sure to bring in all your medications bottles to every appointment.  ? ?

## 2021-03-22 NOTE — Telephone Encounter (Signed)
Pt insurance is active and benefits verified through Medicare a/b Co-pay 0, DED $226/$226 met, out of pocket 0/0 met, co-insurance 20%. no pre-authorization required. Passport, 03/22/2021_0 Laroy Apple, REF# [id number] ? ?2ndary insurance is active and benefits verified through [place]. Co-pay 0, DED 0/0 met, out of pocket 0/0 met, co-insurance 0%. No pre-authorization required. Passport, 03/22/2021_1 :13am, REF# [id number] ? ?Will contact patient to see if she is interested in the Cardiac Rehab Program. If interested, patient will need to complete follow up appt. Once completed, patient will be contacted for scheduling upon review by the RN Navigator. ?

## 2021-03-22 NOTE — Telephone Encounter (Signed)
Pt aware of all instructions. Pt will stop Dig and hold torsemide tomorrow. She will restart on Saturday, and then every other day.  Pt will return to clinic on Monday for repeat blood work. Lab visit scheduled.  ?

## 2021-03-22 NOTE — Telephone Encounter (Signed)
pts home health RN Romelle Starcher called asking for clarification on [place] orders.RN said pts paperwork said pt would be coming to the clinic weekly for labs but for her to also draw a bmet weekly in the home. RN asked does she need to draw labs or will pt be coming here weekly. Per Brittainy Simmons,PA no need to come here weekly they can draw labs at home. "Lab orders depends on her labs today. It was a hard stop when putting in the home health RN order to select something regarding labs. It may not even be necessary to check labs that often. I plan to check a BMP today. I will let you know more when results return" Home health RN aware that I will follow up with her if pt needs future labs done in the home. ? ?Cal back # [phone] ?

## 2021-03-26 ENCOUNTER — Other Ambulatory Visit (HOSPITAL_COMMUNITY): Payer: Self-pay

## 2021-03-26 ENCOUNTER — Other Ambulatory Visit: Payer: Self-pay

## 2021-03-26 ENCOUNTER — Telehealth (HOSPITAL_COMMUNITY): Payer: Self-pay | Admitting: Surgery

## 2021-03-26 ENCOUNTER — Telehealth (HOSPITAL_BASED_OUTPATIENT_CLINIC_OR_DEPARTMENT_OTHER): Payer: Self-pay | Admitting: Pharmacist

## 2021-03-26 ENCOUNTER — Ambulatory Visit (HOSPITAL_COMMUNITY)
Admission: RE | Admit: 2021-03-26 | Discharge: 2021-03-26 | Disposition: A | Discharge status: Home / Self Care | Payer: Medicare Other | Source: Ambulatory Visit | Attending: Internal Medicine | Admitting: Internal Medicine

## 2021-03-26 DIAGNOSIS — I5022 Chronic systolic (congestive) heart failure: Secondary | ICD-10-CM | POA: Diagnosis present

## 2021-03-26 LAB — BASIC METABOLIC PANEL
Anion gap: 13 (ref 5–15)
BUN: 11 mg/dL (ref 8–23)
CO2: 26 mmol/L (ref 22–32)
Calcium: 9.9 mg/dL (ref 8.9–10.3)
Chloride: 98 mmol/L (ref 98–111)
Creatinine, Ser: 1.59 mg/dL — ABNORMAL HIGH (ref 0.44–1.00)
GFR, Estimated: 36 mL/min — ABNORMAL LOW (ref >60)
Glucose, Bld: 122 mg/dL — ABNORMAL HIGH (ref 70–99)
Potassium: 3.1 mmol/L — ABNORMAL LOW (ref 3.5–5.1)
Sodium: 137 mmol/L (ref 135–145)

## 2021-03-26 LAB — DIGOXIN LEVEL: Digoxin Level: 0.5 ng/mL — ABNORMAL LOW (ref 0.8–2.0)

## 2021-03-26 MED ORDER — SPIRONOLACTONE 25 MG PO TABS
25.0000 mg | ORAL_TABLET | Freq: Every day | ORAL | 1 refills | Status: DC
  Filled 2021-03-26: qty 16, 16d supply, fill #0

## 2021-03-26 NOTE — Telephone Encounter (Signed)
Patient contacted and made aware of results and recommendations per Lyda Jester PA.  Patient questioned if she should be taking Potassium.  I advised that I would forward concern and call back if needed. She is aware of appt in 10 days.  Medication list updated in [facility]. ?

## 2021-03-26 NOTE — Telephone Encounter (Signed)
Pharmacy Transitions of Care Follow-up Telephone Call ? ?Date of discharge: 03/19/2021  ?Discharge Diagnosis: Acute on chronic systolic heart failure ? ?How have you been since you were released from the hospital? Doing well, feeling better. Continuing to have some nausea with occasional vomiting (clear in appearance).  She has already been seen by [facility] with close communication from their team. Per chart, Corlanor [employer] Grant approved. Patient states she has the billing information for this to provided to her home pharmacy. Will send her refills at [place] [facility].  ? ?Medication Accessibility: ? ?[facility]  ? ?Was the patient provided with refills on discharged medications? Yes  ? ?Have all prescriptions been transferred from [facility] to home pharmacy? Yes  ? ?Final Patient Assessment: ?- Overall doing well, other than continued nausea.  ?- No questions or concerns in regards to medications today.  ? ?Remer Macho, PharmD ?Clinical Pharmacist ?Engineer, production at [employer] ?Pharmacy: [phone]  ?03/26/2021 3:00 PM ?  ?

## 2021-03-26 NOTE — Telephone Encounter (Signed)
-----   Message from Consuelo Pandy, [place] sent at 03/26/2021  3:17 PM EDT ----- ?Digoxin level trending down and much improved. K low, 3.1. SCr stable  ? ?Keep off digoxin. ? ?Increase Spiro to 25 mg daily. Keep f/u appt in 10 days. Will repeat BMP at visit  ? ?

## 2021-03-27 ENCOUNTER — Other Ambulatory Visit (HOSPITAL_COMMUNITY): Payer: Self-pay

## 2021-03-29 ENCOUNTER — Other Ambulatory Visit: Payer: Self-pay

## 2021-03-29 ENCOUNTER — Inpatient Hospital Stay (HOSPITAL_BASED_OUTPATIENT_CLINIC_OR_DEPARTMENT_OTHER): Payer: Medicare Other | Admitting: Internal Medicine

## 2021-03-29 ENCOUNTER — Other Ambulatory Visit (HOSPITAL_COMMUNITY): Payer: Self-pay

## 2021-03-29 ENCOUNTER — Inpatient Hospital Stay: Payer: Medicare Other | Attending: Physician Assistant

## 2021-03-29 VITALS — BP 120/52 | HR 77 | Temp 96.5°F | Resp 19 | Ht 62.0 in | Wt 176.1 lb

## 2021-03-29 DIAGNOSIS — C8318 Mantle cell lymphoma, lymph nodes of multiple sites: Secondary | ICD-10-CM | POA: Diagnosis present

## 2021-03-29 DIAGNOSIS — C8313 Mantle cell lymphoma, intra-abdominal lymph nodes: Secondary | ICD-10-CM

## 2021-03-29 LAB — CBC WITH DIFFERENTIAL (CANCER CENTER ONLY)
Abs Immature Granulocytes: 0.14 10*3/uL — ABNORMAL HIGH (ref 0.00–0.07)
Basophils Absolute: 0.1 10*3/uL (ref 0.0–0.1)
Basophils Relative: 3 %
Eosinophils Absolute: 0.1 10*3/uL (ref 0.0–0.5)
Eosinophils Relative: 2 %
HCT: 44.2 % (ref 36.0–46.0)
Hemoglobin: 14.6 g/dL (ref 12.0–15.0)
Immature Granulocytes: 3 %
Lymphocytes Relative: 6 %
Lymphs Abs: 0.3 10*3/uL — ABNORMAL LOW (ref 0.7–4.0)
MCH: 31.3 pg (ref 26.0–34.0)
MCHC: 33 g/dL (ref 30.0–36.0)
MCV: 94.6 fL (ref 80.0–100.0)
Monocytes Absolute: 0.9 10*3/uL (ref 0.1–1.0)
Monocytes Relative: 20 %
Neutro Abs: 3 10*3/uL (ref 1.7–7.7)
Neutrophils Relative %: 66 %
Platelet Count: 200 10*3/uL (ref 150–400)
RBC: 4.67 MIL/uL (ref 3.87–5.11)
RDW: 17.8 % — ABNORMAL HIGH (ref 11.5–15.5)
WBC Count: 4.5 10*3/uL (ref 4.0–10.5)
nRBC: 0 % (ref 0.0–0.2)

## 2021-03-29 LAB — CMP (CANCER CENTER ONLY)
ALT: 30 U/L (ref 0–44)
AST: 30 U/L (ref 15–41)
Albumin: 4.6 g/dL (ref 3.5–5.0)
Alkaline Phosphatase: 46 U/L (ref 38–126)
Anion gap: 7 (ref 5–15)
BUN: 12 mg/dL (ref 8–23)
CO2: 33 mmol/L — ABNORMAL HIGH (ref 22–32)
Calcium: 10.6 mg/dL — ABNORMAL HIGH (ref 8.9–10.3)
Chloride: 98 mmol/L (ref 98–111)
Creatinine: 1.44 mg/dL — ABNORMAL HIGH (ref 0.44–1.00)
GFR, Estimated: 40 mL/min — ABNORMAL LOW (ref >60)
Glucose, Bld: 84 mg/dL (ref 70–99)
Potassium: 4 mmol/L (ref 3.5–5.1)
Sodium: 138 mmol/L (ref 135–145)
Total Bilirubin: 0.7 mg/dL (ref 0.3–1.2)
Total Protein: 6.6 g/dL (ref 6.5–8.1)

## 2021-03-29 LAB — LACTATE DEHYDROGENASE: LDH: 237 U/L — ABNORMAL HIGH (ref 98–192)

## 2021-03-29 MED ORDER — TEMAZEPAM 15 MG PO CAPS: 15.0000 mg | ORAL_CAPSULE | Freq: Every evening | ORAL | 0 refills | Status: DC | PRN

## 2021-03-29 NOTE — Progress Notes (Signed)
?    [place] ?Telephone:([phone]   [phone] ? ?OFFICE PROGRESS NOTE ? ?[name], MD ?[address] ?[place] [address] ? ?DIAGNOSIS: Stage IV non-Hodgkin's lymphoma, mantle cell lymphoma.  She presented with left iliac and retroperitoneal lymphadenopathy.  She also presented with left axillary lymphadenopathy and bone marrow involvement.  She was diagnosed in December 2020. ?  ?PRIOR THERAPY:  ?1) Systemic chemotherapy with R-CHOP with Neulasta support.  First dose on 01/27/2019. Status post 6 cycles.  ? 2) Acalabrutinib 100 mg p.o. twice daily.  First dose started June 17, 2020.  Status post 3 months of treatment. 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 ?CURRENT THERAPY: Observation ? ?INTERVAL HISTORY: ?Natalie [name]572.o. 66-year-old female returns to the clinic today for follow-up visit accompanied by her sister.  The patient is feeling fine today with no concerning complaints except for mild fatigue.  She was seen recently by her cardiologist Dr. BeHaroldine Lawsnd 2D echo showed significant decrease in her ejection fraction down to 20-25%.  He added few more medications to her regimen.  The patient is feeling fine today with no concerning chest pain, shortness of breath, cough or hemoptysis.  She has intermittent nausea and currently on Compazine.  She has no fever or chills.  She tried her insomnia medication with Restoril but it was too much for her.  She would probably benefit from a lower dose with the next refill.  She is here today for evaluation and repeat blood work. ? ? ?MEDICAL HISTORY: ?Past Medical History:  ?Diagnosis Date  ? Allergy   ? [facility]  [place]? [place]  ? Colon polyp 12/29/2006  ? Dysrhythmia   ? Frozen shoulder 11/16/2015  ? Hyperlipidemia   ? Hypothyroidism   ? Obesity   ? BMI 34  ? Osteoporosis   ? Palpitations   ? Pre-diabetes   ? Vitamin D deficiency   ? ? ?ALLERGIES:  is allergic to other and ppd [tuberculin purified protein derivative]. ? ?MEDICATIONS:  ?Current Outpatient Medications  ?Medication Sig Dispense Refill  ? amiodarone (PACERONE) 200 MG tablet Take 1 tablet (200 mg total) by mouth daily. 30 tablet 6  ? apixaban (ELIQUIS) 5 MG TABS tablet Take 1 tablet (5 mg total) by mouth 2 (two) times daily. 60 tablet 6  ? cholecalciferol (VITAMIN D3) 25 MCG (1000 UNIT) tablet Take 1,000 Units by mouth daily.    ? dapagliflozin propanediol (FARXIGA) 10 MG TABS tablet Take 1 tablet (10 mg total) by mouth daily. 30 tablet 1  ? estradiol (VIVELLE-DOT) 0.05 MG/24HR patch Place 1 patch onto the skin 2 (two) times a week.    ? ivabradine (CORLANOR) 7.5 MG TABS tablet Take 1 tablet (7.5 mg total) by mouth 2 (two) times daily with a meal. 60 tablet 1  ? levothyroxine (SYNTHROID) 100 MCG tablet TAKE 1 TABLET BY MOUTH DAILY ON AN EMPTY STOMACH WITH ONLY WATER FOR 30 MINUTES & NO ANTACID/CALCIUM/MAGNESIUM FOR 4 HOURS & AVOID BIOTIN 90 tablet 1  ? midodrine (PROAMATINE) 5 MG tablet Take 3 tablets (15 mg total) by mouth 3 (three) times daily with meals. 90 tablet 1  ?  prochlorperazine (COMPAZINE) 10 MG tablet Take 1 tablet (10 mg total) by mouth every 6 (six) hours as needed for nausea or vomiting. 30 tablet 0  ? spironolactone (ALDACTONE) 25 MG tablet Take 1 tablet (25 mg total) by mouth daily. 16 tablet 1  ? sulfamethoxazole-trimethoprim (BACTRIM DS) 800-160 MG tablet Take 1 tablet by mouth 2 (two) times a week. Saturday and Sunday    ? temazepam (RESTORIL) 30 MG capsule Take 1 capsule (30 mg total) by mouth at bedtime as needed for sleep. 30 capsule 0  ? torsemide (DEMADEX) 20 MG tablet Take 1 tablet by mouth every other day. 30 tablet  6  ? valACYclovir (VALTREX) 500 MG tablet Take 500 mg by mouth 2 (two) times daily.    ? ?No current facility-administered medications for this visit.  ? ? ?SURGICAL HISTORY:  ?Past Surgical History:  ?Procedure Laterality Date  ? ABDOMINAL HYSTERECTOMY  1997  ? [place]  ? CHOLECYSTECTOMY N/A 09/25/2020  ? Procedure: LAPAROSCOPIC CHOLECYSTECTOMY;  Surgeon: Kinsinger, Arta Bruce, MD;  Location: [facility];  Service: General;  Laterality: N/A;  60 MINUTES  ? COLONOSCOPY W/ BIOPSIES  12/29/2006  ? IR IMAGING GUIDED PORT INSERTION  01/15/2019  ? LAPAROSCOPY    ? 1991 for emdometriosis  ? RIGHT/LEFT HEART CATH AND CORONARY ANGIOGRAPHY N/A 06/25/2019  ? Procedure: RIGHT/LEFT HEART CATH AND CORONARY ANGIOGRAPHY;  Surgeon: Jolaine Artist, MD;  Location: [place] CV LAB;  Service: Cardiovascular;  Laterality: N/A;  ? UPPER GASTROINTESTINAL ENDOSCOPY    ? ? ?REVIEW OF SYSTEMS:  A comprehensive review of systems was negative except for: Constitutional: positive for fatigue  ? ?PHYSICAL EXAMINATION: General appearance: alert, cooperative, fatigued, and no distress ?Head: Normocephalic, without obvious abnormality, atraumatic ?Neck: no adenopathy, no JVD, supple, symmetrical, trachea midline, and thyroid not enlarged, symmetric, no tenderness/mass/nodules ?Lymph nodes: Cervical, supraclavicular, and axillary nodes normal. ?Resp: clear to auscultation bilaterally ?Back: symmetric, no curvature. ROM normal. No CVA tenderness. ?Cardio: regular rate and rhythm, S1, S2 normal, no murmur, click, rub or gallop ?GI: soft, non-tender; bowel sounds normal; no masses,  no organomegaly ?Extremities: extremities normal, atraumatic, no cyanosis or edema ? ?ECOG PERFORMANCE STATUS: 1 - Symptomatic but completely ambulatory ? ?Blood pressure (!) 120/52, pulse 77, temperature (!) 96.5 ?F (35.8 ?C), temperature source Tympanic, resp. rate 19, height _0  (1.575 m), weight 176 lb 1.6 oz (79.9 kg), SpO2 99 %. ? ?LABORATORY  DATA: ?Lab Results  ?Component Value Date  ? WBC 4.5 03/29/2021  ? HGB 14.6 03/29/2021  ? HCT 44.2 03/29/2021  ? MCV 94.6 03/29/2021  ? PLT 200 03/29/2021  ? ? ?  Chemistry   ?   ?Component Value Date/Time  ? NA 137 03/26/2021 1357  ? K 3.1 (L) 03/26/2021 1357  ? CL 98 03/26/2021 1357  ? CO2 26 03/26/2021 1357  ? BUN 11 03/26/2021 1357  ? CREATININE 1.59 (H) 03/26/2021 1357  ? CREATININE 1.49 (H) 03/01/2021 1218  ? CREATININE 1.03 12/12/2020 0957  ?    ?Component Value Date/Time  ? CALCIUM 9.9 03/26/2021 1357  ? ALKPHOS 64 03/07/2021 1500  ? AST 127 (H) 03/07/2021 1500  ? AST 121 (H) 03/01/2021 1218  ? ALT 381 (H) 03/07/2021 1500  ? ALT 161 (H) 03/01/2021 1218  ? BILITOT 1.1 03/07/2021 1500  ? BILITOT 1.3 (H) 03/01/2021 1218  ?  ? ? ? ?RADIOGRAPHIC STUDIES: ?ECHOCARDIOGRAM COMPLETE ? ?Result Date: 03/08/2021 ?   ECHOCARDIOGRAM REPORT   Patient Name:  Safiatou L Gallier Date of Exam: 03/08/2021 Medical Rec #:  [id number]        Height:       62.0 in Accession #:    [id number]       Weight:       187.6 lb Date of Birth:  April 03, 1955       BSA:          1.860 m? Patient Age:    39 years         BP:           132/85 mmHg Patient Gender: F                HR:           124 bpm. Exam Location:  Inpatient Procedure: 2D Echo, Cardiac Doppler, Color Doppler and Intracardiac            Opacification Agent Indications:    CHF  History:        Patient has prior history of Echocardiogram examinations, most                 recent 05/04/2020. CHF; Risk Factors:Dyslipidemia.  Sonographer:    Luisa Hart RDCS Referring Phys: [place]  Sonographer Comments: Technically difficult study due to poor echo windows. IMPRESSIONS  1. Left ventricular ejection fraction, by estimation, is 20 to 25%. The left ventricle has severely decreased function. The left ventricle demonstrates global hypokinesis. Indeterminate diastolic filling due to E-A fusion. LV filling pressure is elevated.  2. Right ventricular systolic function is mildly  reduced. The right ventricular size is normal. There is mildly elevated pulmonary artery systolic pressure. The estimated right ventricular systolic pressure is 27.0 mmHg.  3. A small pericardial effusion is presen

## 2021-04-02 ENCOUNTER — Telehealth (HOSPITAL_COMMUNITY): Payer: Self-pay | Admitting: Pharmacy Technician

## 2021-04-02 ENCOUNTER — Other Ambulatory Visit (HOSPITAL_COMMUNITY): Payer: Self-pay | Admitting: Physician Assistant

## 2021-04-02 NOTE — Telephone Encounter (Signed)
Advanced Heart Failure Patient Advocate Encounter ? ?Patient called and had questions regarding [place]. Wanted to see which medications were covered under the grant. Advised her that it would cover Corlanor and [place]. Explained the 3% OOP rule for BMS assistance with Eliquis. ? ?Charlann Boxer, CPhT ? ?

## 2021-04-04 NOTE — Progress Notes (Signed)
?Advanced Heart Failure Clinic Note  ? ?Referring Physician: ?PCP: [name], MD ?PCP-Cardiologist: Elouise Munroe, MD  ?[facility]: Dr. Haroldine Laws  ? ?Reason for Visit:  F/u for Chronic Systolic Heart Failure ? ?HPI: ? ?Natalie Baker is a 66 y.o. female with a hx of palpitations thought to be PVCs, HTN, non hodgkins lymphoma treated w/ R-CHOP and systolic HF. ?  ?Admitted 6/21 with acute HF. ECHO  EF 20-25% w/ mod-severe MR.  RV moderately reduced. Developed VT and was started on amiodarone and transferred to [facility] for [facility] which showed normal coronaries, elevated filling pressures w/ low CO c/w cardiogenic shock. CI was 1.7. She was started on milrinone, IV amiodarone and IV Lasix for diuresis. ?  ?Echo 9/21: EF 40-45%  ? ?On repeat echo 05/04/20 EF had normalized to 50-55% G2DD.  ?  ?At oncology f/u 6/22,  repeat CT scan unfortunately showed evidence for disease progression with mild increase in the small bilateral axillary lymph nodes and moderate increase in the left supraclavicular as well as ileocolic mesenteric lymph nodes and left external iliac lymph nodes but mild increase in the periaortic retroperitoneal lymph nodes. She started treatment with acalabrutinib 100 mg p.o. twice daily on June 17 2020 and Dr. Julien Nordmann referred her to [facility] for bone marrow transplant evaluation. As part of w/u at [facility], an echocardiogram was obtained and showed drop in LVEF back down to 40% w/ echodensity vs thickening of the left ventricular apex concerning for possible LV thrombus. Subsequently, she was referred back to [facility] for further evaluation.  ?  ?Had follow up 10/22, and endorsed stable NYHA II symptoms. Losartan was increased and cMRI arranged to assess for LV thrombus.  ?  ?cMRI 11/22 LVEF 39%, no LV thrombus noted. ?  ?Follow up 11/22 Not a candidate for stem cell transplant, but will trial CAR-T therapy soon.  ?  ?Recently d/c from [facility] after CAR-T therapy, had cytokine storm, taken off all HF meds due to low BP.  Echo 1/23 with EF 40-45%.  ?  ?Most recently, she was direct admitted from HF clinic on 03/07/21 with acute on chronic systolic CHF with low-output. Bedside echo with EF < 20%. PICC line placed. Initial co-ox 34% and she was started on inotrope support with milrinone. Diuresed with IV lasix then transitioned to po torsemide. She failed initial milrinone wean then was eventually titrated off milrinone with stable co-ox. Torsemide decreased to 20 mg daily (starting 03/15) at discharge d/t AKI and low volume. Scr 1.24>>1.64 (baseline 1.0). She was started on GDMT. Had frequent PVCs and runs of NSVT which improved with addition of po amiodarone. Course also complicated by left brachial DVT for which she was started on Eliquis. Discharged on 3/13. D/c wt 172 lb.  ? ?At post hospital f/u, she was doing well. Wt only up 2 lb since d/c, 172>>174 lb. ReDs Clip 32%. BP stable, 124/78. EKG showed NSR. No PVCs. NYHA class II. However, her labs returned abnormal. Digoxin level was elevated at 2.5 (EKG ok), Scr had risen further from 1.64>>1.73. She was instructed to stop Digoxin and to hold torsemide x 1 day, then reduced frequency to qod dosing. She had repeat labs on 3/20 which showed Dig level down to 0.5.  SCr back down to 1.6 w/ low K at 3.1. She was instructed to remain off dig and spiro increased to 25 mg daily.  ? ?Since that time, she had f/u at the cancer center on 3/23 and had repeat labs. SCr  down to 1.44, K 4.0. Per Dr. Worthy Flank note, she remains on observation w/ plans to continue monthly blood work and f/u w/ him in 2 months.  ? ?She returns back today for f/u. Here w/ husband. Reports doing well. No significant dyspnea w/ ADLs but has some fatigue. No palpitations. Full compliant w/ meds. Let arm pain improved. Compliant w/ Eliquis. No abnormal bleeding.  ? ?With diuretic dose reduction, she reports her weight has been stable, 174 lb today. ReDs Clip 33%.  ? ?BP is 126/80, continues on midodrine 15 mg tid.   ? ?Scheduled for repeat bone marrow biopsy at [facility] next week.  ? ? ? ?Cardiac/Vascular Studies: ?Echo 06/2019 EF 20-25%  moderately reduced RV function with severe MR/TR ?Echo 9/21: EF 40-45%, RV normal  ?Echo  4/22: EF 50-55% G2DD. RV normal  ?Echo [facility] 10/04/20: EF 40%, grade I DD, RV ok, mild MR, mild LAE, small circumferential pericardial effusion, Echodensity vs thickening of the left ventricular apex, cannot rule out thrombus ?Echo 3/23 LVEF 20-25%, RV mildly reduced. Small pericardial effusion, no tamponade  ?  ?cMRI 11/22 EF 39%, no LV thrombus ?  ?Cath 6/18 with normal cors. Low output. ?Ao = 109/71 (81) ?LV =  99/23 ?RA =  15 ?RV =  43/18 ?PA =  35/14 (25) ?PCW = 22 ?Fick cardiac output/index = 3.3/1.7 ?PVR = < 1.0 WU ?Ao sat = 100% ?PA sat = 49%, 53%  ?  ?Assessment: ?1. Normal coronary arteries ?2. Severe NICM EF 20% ?3. Elevated filling pressures and low CO c/w cardiogenic shock ?4. Brief run of VT  ? ?Venous duplex LUE, 03/13/21: ?Summary:  ?Right:  ?No evidence of thrombosis in the subclavian.  ?   ?Left:  ?Findings consistent with partial age indeterminate deep vein thrombosis  ?involving the left brachial veins. ? ? ?Review of systems complete and found to be negative unless listed in HPI.   ? ? ?Past Medical History:  ?Diagnosis Date  ? Allergy   ? Cancer [facility])   ? being worked up for [place]  ? CHF (congestive heart failure) ([place])   ? Colon polyp 12/29/2006  ? Dysrhythmia   ? Frozen shoulder 11/16/2015  ? Hyperlipidemia   ? Hypothyroidism   ? Obesity   ? BMI 34  ? Osteoporosis   ? Palpitations   ? Pre-diabetes   ? Vitamin D deficiency   ? ? ?Current Outpatient Medications  ?Medication Sig Dispense Refill  ? amiodarone (PACERONE) 200 MG tablet Patient takes 1/2 tablet by mouth twice a day.    ? apixaban (ELIQUIS) 5 MG TABS tablet Patient takes 1/2 tablet by mouth  twice a day.    ? cholecalciferol (VITAMIN D3) 25 MCG (1000 UNIT) tablet Take 1,000 Units by mouth daily.    ? dapagliflozin propanediol  (FARXIGA) 10 MG TABS tablet Take 1 tablet (10 mg total) by mouth daily. 30 tablet 1  ? estradiol (VIVELLE-DOT) 0.05 MG/24HR patch Place 1 patch onto the skin 2 (two) times a week.    ? ivabradine (CORLANOR) 7.5 MG TABS tablet Take 1 tablet (7.5 mg total) by mouth 2 (two) times daily with a meal. 60 tablet 1  ? levothyroxine (SYNTHROID) 100 MCG tablet TAKE 1 TABLET BY MOUTH DAILY ON AN EMPTY STOMACH WITH ONLY WATER FOR 30 MINUTES & NO ANTACID/CALCIUM/MAGNESIUM FOR 4 HOURS & AVOID BIOTIN 90 tablet 1  ? midodrine (PROAMATINE) 5 MG tablet Take 3 tablets (15 mg total) by mouth 3 (three) times daily with  meals. 90 tablet 1  ? prochlorperazine (COMPAZINE) 10 MG tablet Take 1 tablet (10 mg total) by mouth every 6 (six) hours as needed for nausea or vomiting. 30 tablet 0  ? spironolactone (ALDACTONE) 25 MG tablet Take 1 tablet (25 mg total) by mouth daily. 16 tablet 1  ? sulfamethoxazole-trimethoprim (BACTRIM DS) 800-160 MG tablet Take 1 tablet by mouth 2 (two) times a week. Saturday and Sunday    ? temazepam (RESTORIL) 15 MG capsule Take 1 capsule (15 mg total) by mouth at bedtime as needed for sleep. 30 capsule 0  ? torsemide (DEMADEX) 20 MG tablet Take 1 tablet by mouth every other day. 30 tablet 6  ? valACYclovir (VALTREX) 500 MG tablet Take 500 mg by mouth 2 (two) times daily.    ? ?No current facility-administered medications for this encounter.  ? ? ?Allergies  ?Allergen Reactions  ? Other   ?  Chemo from Jan - May 2021 caused acute heart failure   ? Ppd [Tuberculin Purified Protein Derivative] Swelling  ?  Positive reaction.  Negative Chest Xray 06/16/12  ? ? ?  ?Social History  ? ?Socioeconomic History  ? Marital status: Married  ?  Spouse name: Not on file  ? Number of children: 1  ? Years of education: Not on file  ? Highest education level: Not on file  ?Occupational History  ? Occupation: Theatre manager  ?Tobacco Use  ? Smoking status: Former  ?  Years: 15.00  ?  Types: Cigarettes  ?  Quit date: 01/08/1996   ?  Years since quitting: 25.2  ? Smokeless tobacco: Never  ? Tobacco comments:  ?  quit 1998 but is exposed to 2nd hand smoke  ?Vaping Use  ? Vaping Use: Never used  ?Substance and Sexual Activity  ? [place] Levels

## 2021-04-05 ENCOUNTER — Encounter (HOSPITAL_COMMUNITY): Payer: Self-pay

## 2021-04-05 ENCOUNTER — Ambulatory Visit: Payer: Medicare Other | Admitting: Adult Health

## 2021-04-05 ENCOUNTER — Ambulatory Visit (HOSPITAL_COMMUNITY)
Admission: RE | Admit: 2021-04-05 | Discharge: 2021-04-05 | Disposition: A | Discharge status: Home / Self Care | Payer: Medicare Other | Source: Ambulatory Visit | Attending: Cardiology | Admitting: Cardiology

## 2021-04-05 ENCOUNTER — Encounter (HOSPITAL_COMMUNITY): Payer: Self-pay | Admitting: Internal Medicine

## 2021-04-05 VITALS — BP 126/80 | HR 75 | Wt 174.6 lb

## 2021-04-05 DIAGNOSIS — I493 Ventricular premature depolarization: Secondary | ICD-10-CM | POA: Insufficient documentation

## 2021-04-05 DIAGNOSIS — C8310 Mantle cell lymphoma, unspecified site: Secondary | ICD-10-CM | POA: Diagnosis not present

## 2021-04-05 DIAGNOSIS — I472 Ventricular tachycardia, unspecified: Secondary | ICD-10-CM | POA: Diagnosis not present

## 2021-04-05 DIAGNOSIS — I82622 Acute embolism and thrombosis of deep veins of left upper extremity: Secondary | ICD-10-CM | POA: Diagnosis not present

## 2021-04-05 DIAGNOSIS — I5022 Chronic systolic (congestive) heart failure: Secondary | ICD-10-CM | POA: Diagnosis present

## 2021-04-05 DIAGNOSIS — D72819 Decreased white blood cell count, unspecified: Secondary | ICD-10-CM | POA: Diagnosis not present

## 2021-04-05 DIAGNOSIS — Z7901 Long term (current) use of anticoagulants: Secondary | ICD-10-CM | POA: Diagnosis not present

## 2021-04-05 DIAGNOSIS — D696 Thrombocytopenia, unspecified: Secondary | ICD-10-CM | POA: Insufficient documentation

## 2021-04-05 DIAGNOSIS — Z8249 Family history of ischemic heart disease and other diseases of the circulatory system: Secondary | ICD-10-CM | POA: Diagnosis not present

## 2021-04-05 MED ORDER — MIDODRINE HCL 10 MG PO TABS: 10.0000 mg | ORAL_TABLET | Freq: Three times a day (TID) | ORAL | 3 refills | Status: DC

## 2021-04-05 NOTE — Progress Notes (Signed)
ReDS Vest / Clip - 04/05/21 0900   ? ?  ? ReDS Vest / Clip  ? Station Marker A   ? Ruler Value 31.5   ? ReDS Value Range Low volume   ? ReDS Actual Value 33   ? ?  ?  ? ?  ? ? ?

## 2021-04-05 NOTE — Patient Instructions (Signed)
DECREASE Midodrine to 10 mg, one tab three times a day ? ?Keep cardiology follow up as scheduled ? ? ? ?Do the following things EVERYDAY: ?Weigh yourself in the morning before breakfast. Write it down and keep it in a log. ?Take your medicines as prescribed ?Eat low salt foods--Limit salt (sodium) to 2000 mg per day.  ?Stay as active as you can everyday ?Limit all fluids for the day to less than 2 liters ? ?At the [facility], you and your health needs are our priority. As part of our continuing mission to provide you with exceptional heart care, we have created designated Provider Care Teams. These Care Teams include your primary Cardiologist (physician) and Advanced Practice Providers (APPs- Physician Assistants and Nurse Practitioners) who all work together to provide you with the care you need, when you need it.  ? ?You may see any of the following providers on your designated Care Team at your next follow up: ?Dr Glori Bickers ?Dr Loralie Champagne ?Darrick Grinder, NP ?Lyda Jester, PA ?Jessica Milford,NP ?Marlyce Huge, PA ?Audry Riles, PharmD ? ? ?Please be sure to bring in all your medications bottles to every appointment.  ? ?If you have any questions or concerns before your next appointment please send [place] a message through [place] or call our office at [phone].   ? ?TO LEAVE A MESSAGE FOR THE NURSE SELECT OPTION 2, PLEASE LEAVE A MESSAGE INCLUDING: ?YOUR NAME ?DATE OF BIRTH ?CALL BACK NUMBER ?REASON FOR CALL**this is important as we prioritize the call backs ? ?YOU WILL RECEIVE A CALL BACK THE SAME DAY AS LONG AS YOU CALL BEFORE 4:00 PM ? ?

## 2021-04-12 ENCOUNTER — Other Ambulatory Visit: Payer: Self-pay | Admitting: Internal Medicine

## 2021-04-12 ENCOUNTER — Other Ambulatory Visit (HOSPITAL_COMMUNITY): Payer: Self-pay | Admitting: Internal Medicine

## 2021-04-13 ENCOUNTER — Encounter: Payer: Self-pay | Admitting: Internal Medicine

## 2021-04-26 ENCOUNTER — Other Ambulatory Visit: Payer: Self-pay

## 2021-04-26 ENCOUNTER — Ambulatory Visit: Payer: Medicare Other | Admitting: Internal Medicine

## 2021-04-26 ENCOUNTER — Inpatient Hospital Stay: Payer: Medicare Other | Attending: Physician Assistant

## 2021-04-26 ENCOUNTER — Other Ambulatory Visit: Payer: Medicare Other

## 2021-04-26 DIAGNOSIS — C8313 Mantle cell lymphoma, intra-abdominal lymph nodes: Secondary | ICD-10-CM

## 2021-04-26 DIAGNOSIS — C8318 Mantle cell lymphoma, lymph nodes of multiple sites: Secondary | ICD-10-CM | POA: Insufficient documentation

## 2021-04-26 LAB — CMP (CANCER CENTER ONLY)
ALT: 19 U/L (ref 0–44)
AST: 20 U/L (ref 15–41)
Albumin: 4.3 g/dL (ref 3.5–5.0)
Alkaline Phosphatase: 44 U/L (ref 38–126)
Anion gap: 4 — ABNORMAL LOW (ref 5–15)
BUN: 12 mg/dL (ref 8–23)
CO2: 30 mmol/L (ref 22–32)
Calcium: 9.6 mg/dL (ref 8.9–10.3)
Chloride: 104 mmol/L (ref 98–111)
Creatinine: 1.14 mg/dL — ABNORMAL HIGH (ref 0.44–1.00)
GFR, Estimated: 53 mL/min — ABNORMAL LOW (ref >60)
Glucose, Bld: 111 mg/dL — ABNORMAL HIGH (ref 70–99)
Potassium: 3.4 mmol/L — ABNORMAL LOW (ref 3.5–5.1)
Sodium: 138 mmol/L (ref 135–145)
Total Bilirubin: 0.6 mg/dL (ref 0.3–1.2)
Total Protein: 6.4 g/dL — ABNORMAL LOW (ref 6.5–8.1)

## 2021-04-26 LAB — CBC WITH DIFFERENTIAL (CANCER CENTER ONLY)
Abs Immature Granulocytes: 0.28 10*3/uL — ABNORMAL HIGH (ref 0.00–0.07)
Basophils Absolute: 0.1 10*3/uL (ref 0.0–0.1)
Basophils Relative: 2 %
Eosinophils Absolute: 0.1 10*3/uL (ref 0.0–0.5)
Eosinophils Relative: 3 %
HCT: 42.2 % (ref 36.0–46.0)
Hemoglobin: 14.1 g/dL (ref 12.0–15.0)
Immature Granulocytes: 10 %
Lymphocytes Relative: 5 %
Lymphs Abs: 0.1 10*3/uL — ABNORMAL LOW (ref 0.7–4.0)
MCH: 31.6 pg (ref 26.0–34.0)
MCHC: 33.4 g/dL (ref 30.0–36.0)
MCV: 94.6 fL (ref 80.0–100.0)
Monocytes Absolute: 0.3 10*3/uL (ref 0.1–1.0)
Monocytes Relative: 12 %
Neutro Abs: 2 10*3/uL (ref 1.7–7.7)
Neutrophils Relative %: 68 %
Platelet Count: 176 10*3/uL (ref 150–400)
RBC: 4.46 MIL/uL (ref 3.87–5.11)
RDW: 15.3 % (ref 11.5–15.5)
Smear Review: NORMAL
WBC Count: 2.9 10*3/uL — ABNORMAL LOW (ref 4.0–10.5)
nRBC: 0 % (ref 0.0–0.2)

## 2021-04-26 LAB — LACTATE DEHYDROGENASE: LDH: 214 U/L — ABNORMAL HIGH (ref 98–192)

## 2021-04-27 ENCOUNTER — Other Ambulatory Visit (HOSPITAL_COMMUNITY): Payer: Self-pay | Admitting: Physician Assistant

## 2021-05-04 ENCOUNTER — Ambulatory Visit (HOSPITAL_COMMUNITY)
Admission: RE | Admit: 2021-05-04 | Discharge: 2021-05-04 | Disposition: A | Discharge status: Home / Self Care | Payer: Medicare Other | Source: Ambulatory Visit | Attending: Internal Medicine | Admitting: Internal Medicine

## 2021-05-04 ENCOUNTER — Encounter (HOSPITAL_COMMUNITY): Payer: Self-pay | Admitting: Internal Medicine

## 2021-05-04 ENCOUNTER — Other Ambulatory Visit (HOSPITAL_COMMUNITY): Payer: Self-pay | Admitting: Internal Medicine

## 2021-05-04 VITALS — BP 124/70 | HR 96 | Wt 173.8 lb

## 2021-05-04 DIAGNOSIS — N179 Acute kidney failure, unspecified: Secondary | ICD-10-CM

## 2021-05-04 DIAGNOSIS — Z79899 Other long term (current) drug therapy: Secondary | ICD-10-CM | POA: Diagnosis not present

## 2021-05-04 DIAGNOSIS — I5022 Chronic systolic (congestive) heart failure: Secondary | ICD-10-CM | POA: Diagnosis present

## 2021-05-04 DIAGNOSIS — M79606 Pain in leg, unspecified: Secondary | ICD-10-CM | POA: Diagnosis not present

## 2021-05-04 DIAGNOSIS — R000 Tachycardia, unspecified: Secondary | ICD-10-CM | POA: Diagnosis not present

## 2021-05-04 DIAGNOSIS — I82622 Acute embolism and thrombosis of deep veins of left upper extremity: Secondary | ICD-10-CM | POA: Diagnosis not present

## 2021-05-04 DIAGNOSIS — I493 Ventricular premature depolarization: Secondary | ICD-10-CM | POA: Diagnosis not present

## 2021-05-04 DIAGNOSIS — C8310 Mantle cell lymphoma, unspecified site: Secondary | ICD-10-CM | POA: Diagnosis not present

## 2021-05-04 DIAGNOSIS — Z7984 Long term (current) use of oral hypoglycemic drugs: Secondary | ICD-10-CM | POA: Insufficient documentation

## 2021-05-04 DIAGNOSIS — Z7901 Long term (current) use of anticoagulants: Secondary | ICD-10-CM | POA: Insufficient documentation

## 2021-05-04 DIAGNOSIS — I110 Hypertensive heart disease with heart failure: Secondary | ICD-10-CM | POA: Insufficient documentation

## 2021-05-04 DIAGNOSIS — I472 Ventricular tachycardia, unspecified: Secondary | ICD-10-CM | POA: Insufficient documentation

## 2021-05-04 DIAGNOSIS — R110 Nausea: Secondary | ICD-10-CM | POA: Insufficient documentation

## 2021-05-04 DIAGNOSIS — E876 Hypokalemia: Secondary | ICD-10-CM | POA: Diagnosis not present

## 2021-05-04 MED ORDER — MIDODRINE HCL 5 MG PO TABS: 5.0000 mg | ORAL_TABLET | Freq: Three times a day (TID) | ORAL | 6 refills | Status: DC

## 2021-05-04 MED ORDER — POTASSIUM CHLORIDE CRYS ER 20 MEQ PO TBCR: 20.0000 meq | EXTENDED_RELEASE_TABLET | ORAL | 6 refills | Status: DC

## 2021-05-04 NOTE — Addendum Note (Signed)
Encounter addended by: Scarlette Calico, RN on: 05/04/2021 10:47 AM ? Actions taken: Order list changed, Diagnosis association updated

## 2021-05-04 NOTE — Progress Notes (Signed)
?Advanced Heart Failure Clinic Note  ? ?Referring Physician: ?PCP: [name], MD ?PCP-Cardiologist: Elouise Munroe, MD  ?[facility]: Dr. Haroldine Laws  ? ?Reason for Visit:  F/u for Chronic Systolic Heart Failure ? ?HPI: ? ?Natalie Baker is a 66 y.o. female with a hx of palpitations thought to be PVCs, HTN, non hodgkins lymphoma treated w/ R-CHOP and systolic HF. ?  ?Admitted 6/21 with acute HF. ECHO  EF 20-25% w/ mod-severe MR.  RV moderately reduced. Developed VT and was started on amiodarone and transferred to [facility] for [facility] which showed normal coronaries, elevated filling pressures w/ low CO c/w cardiogenic shock. CI was 1.7. She was started on milrinone, IV amiodarone and IV Lasix for diuresis. ?  ?Echo 9/21: EF 40-45%  ? ?On repeat echo 05/04/20 EF had normalized to 50-55% G2DD.  ?  ?At oncology f/u 6/22,  repeat CT scan unfortunately showed evidence for disease progression with mild increase in the small bilateral axillary lymph nodes and moderate increase in the left supraclavicular as well as ileocolic mesenteric lymph nodes and left external iliac lymph nodes but mild increase in the periaortic retroperitoneal lymph nodes. She started treatment with acalabrutinib 100 mg p.o. twice daily on June 17 2020 and Dr. Julien Nordmann referred her to [facility] for bone marrow transplant evaluation. As part of w/u at [facility], an echocardiogram was obtained and showed drop in LVEF back down to 40% w/ echodensity vs thickening of the left ventricular apex concerning for possible LV thrombus. Subsequently, she was referred back to [facility] for further evaluation.  ?  ?Had follow up 10/22, and endorsed stable NYHA II symptoms. Losartan was increased and cMRI arranged to assess for LV thrombus.  ?  ?cMRI 11/22 LVEF 39%, no LV thrombus noted. ?  ?Follow up 11/22 Not a candidate for stem cell transplant, but will trial CAR-T therapy soon.  ?  ?Recently d/c from [facility] after CAR-T therapy, had cytokine storm, taken off all HF meds due to low BP.  Echo 1/23 with EF 40-45%.  ?  ?Most recently, she was direct admitted from HF clinic on 03/07/21 with acute on chronic systolic CHF with low-output. Bedside echo with EF < 20%. PICC line placed. Initial co-ox 34% and she was started on inotrope support with milrinone. Diuresed with IV lasix then transitioned to po torsemide. She failed initial milrinone wean then was eventually titrated off milrinone with stable co-ox. Torsemide decreased to 20 mg daily (starting 03/15) at discharge d/t AKI and low volume. Scr 1.24>>1.64 (baseline 1.0). She was started on GDMT. Had frequent PVCs and runs of NSVT which improved with addition of po amiodarone. Course also complicated by left brachial DVT for which she was started on Eliquis. Discharged on 3/13. D/c wt 172 lb.  ? ?At post hospital f/u, she was doing well. Wt only up 2 lb since d/c, 172>>174 lb. ReDs Clip 32%. BP stable, 124/78. EKG showed NSR. No PVCs. NYHA class II. However, her labs returned abnormal. Digoxin level was elevated at 2.5 (EKG ok), Scr had risen further from 1.64>>1.73. She was instructed to stop Digoxin and to hold torsemide x 1 day, then reduced frequency to qod dosing. She had repeat labs on 3/20 which showed Dig level down to 0.5.  SCr back down to 1.6 w/ low K at 3.1. She was instructed to remain off dig and spiro increased to 25 mg daily.  ? ?Since that time, she had f/u at the cancer center on 3/23 and had repeat labs. SCr  down to 1.44, K 4.0. Per Dr. Worthy Flank note, she remains on observation w/ plans to continue monthly blood work and f/u w/ him in 2 months.  ? ?She returns back today for f/u. Feels good. Walking some. No SOB with ADLs or light walking. Has to stop at times due to legs pain. No edema, orthopnea or PND. Taking torsemide qoD. Now on midodrine 10 tid. SBP 120s. Appetite poor. Some nausea  ? ? ?Cardiac/Vascular Studies: ?Echo 06/2019 EF 20-25%  moderately reduced RV function with severe MR/TR ?Echo 9/21: EF 40-45%, RV normal  ?Echo   4/22: EF 50-55% G2DD. RV normal  ?Echo [facility] 10/04/20: EF 40%, grade I DD, RV ok, mild MR, mild LAE, small circumferential pericardial effusion, Echodensity vs thickening of the left ventricular apex, cannot rule out thrombus ?Echo 3/23 LVEF 20-25%, RV mildly reduced. Small pericardial effusion, no tamponade  ?  ?cMRI 11/22 EF 39%, no LV thrombus ?  ?Cath 6/18 with normal cors. Low output. ?Ao = 109/71 (81) ?LV =  99/23 ?RA =  15 ?RV =  43/18 ?PA =  35/14 (25) ?PCW = 22 ?Fick cardiac output/index = 3.3/1.7 ?PVR = < 1.0 WU ?Ao sat = 100% ?PA sat = 49%, 53%  ?  ?Assessment: ?1. Normal coronary arteries ?2. Severe NICM EF 20% ?3. Elevated filling pressures and low CO c/w cardiogenic shock ?4. Brief run of VT  ? ?Venous duplex LUE, 03/13/21: ?Summary:  ?Right:  ?No evidence of thrombosis in the subclavian.  ?   ?Left:  ?Findings consistent with partial age indeterminate deep vein thrombosis  ?involving the left brachial veins. ? ? ?Review of systems complete and found to be negative unless listed in HPI.   ? ? ?Past Medical History:  ?Diagnosis Date  ? Allergy   ? Cancer [facility])   ? being worked up for [place]  ? CHF (congestive heart failure) ([place])   ? Colon polyp 12/29/2006  ? Dysrhythmia   ? Frozen shoulder 11/16/2015  ? Hyperlipidemia   ? Hypothyroidism   ? Obesity   ? BMI 34  ? Osteoporosis   ? Palpitations   ? Pre-diabetes   ? Vitamin D deficiency   ? ? ?Current Outpatient Medications  ?Medication Sig Dispense Refill  ? amiodarone (PACERONE) 200 MG tablet Patient takes 1/2 tablet by mouth twice a day.    ? apixaban (ELIQUIS) 5 MG TABS tablet Patient takes 1/2 tablet by mouth  twice a day.    ? cholecalciferol (VITAMIN D3) 25 MCG (1000 UNIT) tablet Take 1,000 Units by mouth daily.    ? estradiol (VIVELLE-DOT) 0.05 MG/24HR patch Place 1 patch onto the skin 2 (two) times a week.    ? FARXIGA 10 MG TABS tablet TAKE 1 TABLET BY MOUTH EVERY DAY 30 tablet 11  ? ivabradine (CORLANOR) 7.5 MG TABS tablet Take 1 tablet (7.5 mg  total) by mouth 2 (two) times daily with a meal. 60 tablet 1  ? levothyroxine (SYNTHROID) 100 MCG tablet TAKE 1 TABLET BY MOUTH DAILY ON AN EMPTY STOMACH WITH ONLY WATER FOR 30 MINUTES & NO ANTACID/CALCIUM/MAGNESIUM FOR 4 HOURS & AVOID BIOTIN 90 tablet 1  ? midodrine (PROAMATINE) 10 MG tablet Take 1 tablet (10 mg total) by mouth 3 (three) times daily with meals. 90 tablet 3  ? prochlorperazine (COMPAZINE) 10 MG tablet TAKE 1 TABLET BY MOUTH EVERY 6 HOURS AS NEEDED FOR NAUSEA OR VOMITING. 30 tablet 0  ? spironolactone (ALDACTONE) 25 MG tablet Take 1 tablet (25  mg total) by mouth daily. 16 tablet 1  ? sulfamethoxazole-trimethoprim (BACTRIM DS) 800-160 MG tablet Take 1 tablet by mouth 2 (two) times a week. Saturday and Sunday    ? temazepam (RESTORIL) 15 MG capsule Take 1 capsule (15 mg total) by mouth at bedtime as needed for sleep. 30 capsule 0  ? torsemide (DEMADEX) 20 MG tablet Take 1 tablet by mouth every other day. 30 tablet 6  ? valACYclovir (VALTREX) 500 MG tablet Take 500 mg by mouth 2 (two) times daily.    ? ?No current facility-administered medications for this encounter.  ? ? ?Allergies  ?Allergen Reactions  ? Other   ?  Chemo from Jan - May 2021 caused acute heart failure   ? Ppd [Tuberculin Purified Protein Derivative] Swelling  ?  Positive reaction.  Negative Chest Xray 06/16/12  ? ? ?  ?Social History  ? ?Socioeconomic History  ? Marital status: Married  ?  Spouse name: Not on file  ? Number of children: 1  ? Years of education: Not on file  ? Highest education level: Not on file  ?Occupational History  ? Occupation: Theatre manager  ?Tobacco Use  ? Smoking status: Former  ?  Years: 15.00  ?  Types: Cigarettes  ?  Quit date: 01/08/1996  ?  Years since quitting: 25.3  ? Smokeless tobacco: Never  ? Tobacco comments:  ?  quit 1998 but is exposed to 2nd hand smoke  ?Vaping Use  ? Vaping Use: Never used  ?Substance and Sexual Activity  ? Alcohol use: No  ? Drug use: No  ? Sexual activity: Yes  ?   Partners: Male  ?  Birth control/protection: Post-menopausal  ?Other Topics Concern  ? Not on file  ?Social History Narrative  ? 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

## 2021-05-04 NOTE — Addendum Note (Signed)
Encounter addended by: Scarlette Calico, RN on: 05/04/2021 10:38 AM ? Actions taken: Order list changed, Clinical Note Signed

## 2021-05-04 NOTE — Patient Instructions (Signed)
Decrease Midodrine to 5 mg Three times a day  ? ?Take 2 Potassium pills today then take 1 tab when you take Torsemide ? ?Your physician recommends that you return for lab work in: 2 weeks ? ?Your physician recommends that you schedule a follow-up appointment in: 4 weeks and again in 2 months with an echocardiogram ? ?If you have any questions or concerns before your next appointment please send [place] a message through [place] or call our office at [phone].   ? ?TO LEAVE A MESSAGE FOR THE NURSE SELECT OPTION 2, PLEASE LEAVE A MESSAGE INCLUDING: ?YOUR NAME ?DATE OF BIRTH ?CALL BACK NUMBER ?REASON FOR CALL**this is important as we prioritize the call backs ? ?YOU WILL RECEIVE A CALL BACK THE SAME DAY AS LONG AS YOU CALL BEFORE 4:00 PM ? ?At the [facility], you and your health needs are our priority. As part of our continuing mission to provide you with exceptional heart care, we have created designated Provider Care Teams. These Care Teams include your primary Cardiologist (physician) and Advanced Practice Providers (APPs- Physician Assistants and Nurse Practitioners) who all work together to provide you with the care you need, when you need it.  ? ?You may see any of the following providers on your designated Care Team at your next follow up: ?Dr Glori Bickers ?Dr Loralie Champagne ?Darrick Grinder, NP ?Lyda Jester, PA ?Jessica Milford,NP ?Marlyce Huge, PA ?Audry Riles, PharmD ? ? ?Please be sure to bring in all your medications bottles to every appointment.  ? ? ?

## 2021-05-07 ENCOUNTER — Other Ambulatory Visit (HOSPITAL_COMMUNITY): Payer: Self-pay | Admitting: Physician Assistant

## 2021-05-11 ENCOUNTER — Other Ambulatory Visit (HOSPITAL_COMMUNITY): Payer: Self-pay | Admitting: Physician Assistant

## 2021-05-11 IMAGING — PT NM PET TUM IMG RESTAG (PS) SKULL BASE T - THIGH
1 of 8 series · 1 of 25 positions shown · non-contrast
Comparison: CT scan 12/22/2018

CLINICAL DATA: Initial treatment strategy for lymphadenopathy.

EXAM:
NUCLEAR MEDICINE PET SKULL BASE TO THIGH
TECHNIQUE: 10.0 mCi F-18 FDG was injected intravenously. Full-ring PET imaging
was performed from the skull base to thigh after the radiotracer. CT
data was obtained and used for attenuation correction and anatomic
localization.
Fasting blood glucose: 102 mg/dl

[Series 4: pet sk_thigh ac · axial · 5.0mm · 4.07mm/px · 1 of 208 slices shown]
[im 139/208]
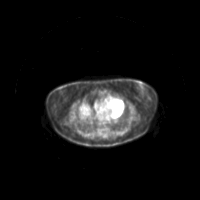

[1 of 25 positions shown; findings below may reference images not displayed]

FINDINGS: Mediastinal blood pool activity: SUV max

Liver activity: SUV max

NECK: No hypermetabolic lymph nodes in the neck.

Incidental CT findings: none

CHEST: Mild metabolic activity within the axillary lymph nodes
similar to background blood pool activity and less than liver
activity. Example LEFT axillary node measures 9 mm short axis with
SUV max

No hypermetabolic mediastinal nodes.  No enlarged mediastinal nodes

Nodule in the RIGHT middle lobe measures 6 mm (68/3). No associated
metabolic activity. There is atelectasis in the more peripheral
RIGHT middle lobe also without metabolic

Incidental CT findings: none

ABDOMEN/PELVIS: enlarged retroperitoneal lymph node adjacent to the
SMA measures 20 mm short axis and with moderate metabolic activity
(SUV max equal 5.0)

Enlarged LEFTinternal iliac lymph node measures 12 mm (image 133/3)
with (SUV max equal 5.5) There is enlarged LEFT external iliac lymph
node measuring 19 mm on image 153/3 with SUV max equal 5.1. No
enlarged hypermetabolic inguinal nodes.

No central mesenteric nodes.

There is a focus of metabolic activity along the inferior margin of
the spleen SUV max equal 5.5 (image 107 fused data set.) No clear
splenic lesion associated with this activity.

Incidental CT findings:

SKELETON: No focal hypermetabolic activity to suggest skeletal
metastasis.

Incidental CT findings: none
IMPRESSION: 1. Hypermetabolic retroperitoneal and LEFT iliac lymphadenopathy
consistent with lymphoma. Activity is mild-to-moderate suggesting
intermediate grade lymphoma. Activity is slightly above liver
activity. Granulomatous disease could also have this pattern but
lymphoproliferative disease favored.
2. Mild activity associated with prominent LEFT axillary lymph nodes
may also represent lymphoma.
3. Focal activity along the inferior margin of the spleen is
indeterminate. No lesion seen on comparison contrast CT
4. No evidence of marrow involvement with lymphoproliferative
process.
5. RIGHT middle lobe pulmonary nodule. Recommend attention on
follow-up.
6. Fortissue sampling planning, the most assessable lymph node may
be the LEFT external iliac node. LEFT axillary nodes are assessable
but metabolic activity is significantly less.

## 2021-05-18 ENCOUNTER — Ambulatory Visit (HOSPITAL_COMMUNITY)
Admission: RE | Admit: 2021-05-18 | Discharge: 2021-05-18 | Disposition: A | Discharge status: Home / Self Care | Payer: Medicare Other | Source: Ambulatory Visit | Attending: Cardiology | Admitting: Cardiology

## 2021-05-18 ENCOUNTER — Other Ambulatory Visit: Payer: Self-pay

## 2021-05-18 ENCOUNTER — Inpatient Hospital Stay: Payer: Medicare Other | Attending: Physician Assistant

## 2021-05-18 DIAGNOSIS — Z452 Encounter for adjustment and management of vascular access device: Secondary | ICD-10-CM | POA: Diagnosis present

## 2021-05-18 DIAGNOSIS — C8318 Mantle cell lymphoma, lymph nodes of multiple sites: Secondary | ICD-10-CM | POA: Diagnosis present

## 2021-05-18 DIAGNOSIS — I5022 Chronic systolic (congestive) heart failure: Secondary | ICD-10-CM | POA: Insufficient documentation

## 2021-05-18 DIAGNOSIS — Z95828 Presence of other vascular implants and grafts: Secondary | ICD-10-CM

## 2021-05-18 DIAGNOSIS — D72819 Decreased white blood cell count, unspecified: Secondary | ICD-10-CM | POA: Diagnosis not present

## 2021-05-18 LAB — BASIC METABOLIC PANEL
Anion gap: 11 (ref 5–15)
BUN: 15 mg/dL (ref 8–23)
CO2: 27 mmol/L (ref 22–32)
Calcium: 9.6 mg/dL (ref 8.9–10.3)
Chloride: 101 mmol/L (ref 98–111)
Creatinine, Ser: 1.27 mg/dL — ABNORMAL HIGH (ref 0.44–1.00)
GFR, Estimated: 47 mL/min — ABNORMAL LOW (ref >60)
Glucose, Bld: 113 mg/dL — ABNORMAL HIGH (ref 70–99)
Potassium: 3.3 mmol/L — ABNORMAL LOW (ref 3.5–5.1)
Sodium: 139 mmol/L (ref 135–145)

## 2021-05-18 LAB — BRAIN NATRIURETIC PEPTIDE: B Natriuretic Peptide: 538.5 pg/mL — ABNORMAL HIGH (ref 0.0–100.0)

## 2021-05-18 MED ORDER — HEPARIN SOD (PORK) LOCK FLUSH 100 UNIT/ML IV SOLN
500.0000 [IU] | Freq: Once | INTRAVENOUS | Status: AC | PRN
  Administered 2021-05-18: 500 [IU]

## 2021-05-18 MED ORDER — SODIUM CHLORIDE 0.9% FLUSH
10.0000 mL | INTRAVENOUS | Status: DC | PRN
  Administered 2021-05-18: 10 mL

## 2021-05-21 ENCOUNTER — Other Ambulatory Visit (HOSPITAL_COMMUNITY): Payer: Self-pay

## 2021-05-21 ENCOUNTER — Telehealth (HOSPITAL_COMMUNITY): Payer: Self-pay | Admitting: Surgery

## 2021-05-21 MED ORDER — IVABRADINE HCL 7.5 MG PO TABS
7.5000 mg | ORAL_TABLET | Freq: Two times a day (BID) | ORAL | 3 refills | Status: DC
  Filled 2021-05-21: qty 60, 30d supply, fill #0
  Filled 2021-07-18: qty 60, 30d supply, fill #1
  Filled 2021-08-28: qty 60, 30d supply, fill #2

## 2021-05-21 NOTE — Telephone Encounter (Signed)
Patient only taking Potassium 20 Meq every other day (with Torsemide).  I instructed that I would call back with specific instructions.  She returns on May 26th for follow-up. ?

## 2021-05-21 NOTE — Telephone Encounter (Signed)
-----   Message from Jolaine Artist, MD sent at 05/20/2021 12:40 PM EDT ----- ?How much potassium is she taking? If 2 tabs a day please go up to 3 per day - if not currently taking 2 a day please let me know what she is on before we make changes  Repeat BMET in 2 weeks. Thanks -dan ?

## 2021-05-22 ENCOUNTER — Other Ambulatory Visit: Payer: Self-pay | Admitting: Adult Health

## 2021-05-22 ENCOUNTER — Telehealth (HOSPITAL_COMMUNITY): Payer: Self-pay | Admitting: Surgery

## 2021-05-22 DIAGNOSIS — I5022 Chronic systolic (congestive) heart failure: Secondary | ICD-10-CM

## 2021-05-22 DIAGNOSIS — E039 Hypothyroidism, unspecified: Secondary | ICD-10-CM

## 2021-05-22 MED ORDER — POTASSIUM CHLORIDE CRYS ER 20 MEQ PO TBCR: 20.0000 meq | EXTENDED_RELEASE_TABLET | Freq: Every day | ORAL | 6 refills | Status: DC

## 2021-05-22 NOTE — Telephone Encounter (Signed)
I contacted patient to clarify order form Dr. Haroldine Laws.  She will change Potassium dose to 20 meq daily and was instructed to take 3 tablets (60 meq) today only.  She returns for repeat labwork on 5/26.  Medication list updated in [facility] ?

## 2021-05-22 NOTE — Telephone Encounter (Signed)
-----   Message from Jolaine Artist, MD sent at 05/21/2021  4:59 PM EDT ----- ?I meant potassium. Take potassium 20 daily with 97mq today.  Keep torsemide every other day. Thanks ? ?Sorry for confusion ?----- Message ----- ?From: WMicki Riley RN ?Sent: 05/21/2021   4:14 PM EDT ?To: DJolaine Artist MD, HScarlette Calico RN ? ?Okay so she needs to increase Torsemide to 20 mg daily and take Potassium 60 meq today as well as increase Potassium to 20 meq daily? ? ?Thanks ?----- Message ----- ?From: BJolaine Artist MD ?Sent: 05/21/2021   3:22 PM EDT ?To: DMicki Riley RN ? ?Take torsemide 20 daily. Tell her to take 3 today only.  Thanks.  ? ?----- Message ----- ?From: WMicki Riley RN ?Sent: 05/21/2021   3:15 PM EDT ?To: DJolaine Artist MD ? ?Patient only taking Potassium 20 Meq every other day (with Torsemide).  I instructed that I would call back with specific instructions.  She returns on May 26th for follow-up. ? ? ? ?

## 2021-05-25 ENCOUNTER — Other Ambulatory Visit (HOSPITAL_COMMUNITY): Payer: Self-pay | Admitting: Internal Medicine

## 2021-05-28 ENCOUNTER — Other Ambulatory Visit (HOSPITAL_COMMUNITY): Payer: Self-pay

## 2021-05-30 NOTE — Progress Notes (Signed)
Advanced Heart Failure Clinic Note    PCP: [name], MD PCP-Cardiologist: Elouise Munroe, MD  [facility]: Dr. Haroldine Laws   Reason for Visit:  F/u for Chronic Systolic Heart Failure  HPI:  Natalie Baker is a 66 y.o.Marland Kitchen female with a hx of palpitations thought to be PVCs, HTN, non hodgkins lymphoma treated w/ R-CHOP and systolic HF.   Admitted 6/21 with acute HF. ECHO  EF 20-25% w/ mod-severe MR.  RV moderately reduced. Developed VT and was started on amiodarone and transferred to [facility] for [facility] which showed normal coronaries, elevated filling pressures w/ low CO c/w cardiogenic shock. CI was 1.7. She was started on milrinone, IV amiodarone and IV Lasix for diuresis.   Echo 9/21: EF 40-45%   On repeat echo 05/04/20 EF had normalized to 50-55% G2DD.    At oncology f/u 6/22,  repeat CT scan unfortunately showed evidence for disease progression with mild increase in the small bilateral axillary lymph nodes and moderate increase in the left supraclavicular as well as ileocolic mesenteric lymph nodes and left external iliac lymph nodes but mild increase in the periaortic retroperitoneal lymph nodes. She started treatment with acalabrutinib 100 mg p.o. twice daily on June 17 2020 and Dr. Julien Nordmann referred her to [facility] for bone marrow transplant evaluation. As part of w/u at [facility], an echocardiogram was obtained and showed drop in LVEF back down to 40% w/ echodensity vs thickening of the left ventricular apex concerning for possible LV thrombus. Subsequently, she was referred back to [facility] for further evaluation.    Had follow up 10/22, and endorsed stable NYHA II symptoms. Losartan was increased and cMRI arranged to assess for LV thrombus.    cMRI 11/22 LVEF 39%, no LV thrombus noted.   Follow up 11/22 Not a candidate for stem cell transplant, but will trial CAR-T therapy soon.    Recently d/c from [facility] after CAR-T therapy, had cytokine storm, taken off all HF meds due to low BP. Echo 1/23 with EF  40-45%.    Direct admit from HF clinic on 03/07/21 with acute on chronic systolic CHF with low-output. Bedside echo with EF < 20%. PICC line placed. Initial co-ox 34%, started on milrinone. Diuresed with IV lasix then transitioned to po torsemide. Torsemide decreased to 20 mg daily (starting 03/15) at discharge d/t AKI and low volume. Scr 1.24>>1.64 (baseline 1.0). She was started on GDMT. Had frequent PVCs and runs of NSVT which improved with addition of po amiodarone. Course also complicated by left brachial DVT subsequently started on Eliquis. Discharged on 3/13. D/c wt 172 lb.   Doing well at post hospital f/u, volume stable, ReDs Clip 32%. Digoxin level was elevated at 2.5 (EKG ok), Scr up1.64>>1.73. Instructed to stop Digoxin and to hold torsemide x 1 day, then reduced frequency to qod dosing. Repeat labs showed Dig level down to 0.5.  SCr 1.6 w/ low K at 3.1, instructed to remain off dig and spiro increased to 25 mg daily.   Since that time, she had f/u at the cancer center on 3/23 and had repeat labs. SCr down to 1.44, K 4.0. Per Dr. Worthy Flank note, she remains on observation w/ plans to continue monthly blood work and f/u w/ him in 2 months.   Follow up 4/23, stable NYHA II, volume OK on torsemide 20 QOD.  Today she returns for HF follow up. Overall feeling fine. Main complaint is nausea. She is able to get around the house OK and walk short distances  without much SOB. Denies palpitaitons, CP, dizziness, edema, or PND/Orthopnea. Appetite ok. No fever or chills. Weight at home 167-169 pounds. Taking all medications. Has Zofran and compazine, not sure which is safe to use with heart meds.   Cardiac/Vascular Studies: - Echo (6/21): EF 20-25%  moderately reduced RV function with severe MR/TR  - Echo (9/21): EF 40-45%, RV normal   - Echo (4/22): EF 50-55% G2DD. RV normal   - Echo [facility] (9/22): EF 40%, grade I DD, RV ok, mild MR, mild LAE, small circumferential pericardial effusion, Echodensity  vs thickening of the left ventricular apex, cannot rule out thrombus  - Echo (3/23): EF 20-25%, RV mildly reduced. Small pericardial effusion, no tamponade    - cMRI (11/22) EF 39%, no LV thrombus   - Cath (6/18): with normal cors. Low output. Ao = 109/71 (81) LV =  99/23 RA =  15 RV =  43/18 PA =  35/14 (25) PCW = 22 Fick cardiac output/index = 3.3/1.7 PVR = < 1.0 WU Ao sat = 100% PA sat = 49%, 53%    Assessment: 1. Normal coronary arteries 2. Severe NICM EF 20% 3. Elevated filling pressures and low CO c/w cardiogenic shock 4. Brief run of VT   - Venous duplex LUE, 03/13/21: Summary:  Right:  No evidence of thrombosis in the subclavian.     Left:  Findings consistent with partial age indeterminate deep vein thrombosis  involving the left brachial veins.  Review of systems complete and found to be negative unless listed in HPI.    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     apixaban (ELIQUIS) 5 MG TABS tablet Patient takes 1/2 tablet by mouth  twice a day.     cholecalciferol (VITAMIN D3) 25 MCG (1000 UNIT) tablet Take 1,000 Units by mouth daily.     estradiol (VIVELLE-DOT) 0.05 MG/24HR patch Place 1 patch onto the skin 2 (two) times a week.     FARXIGA 10 MG TABS tablet TAKE 1 TABLET BY MOUTH EVERY DAY 30 tablet 11   ivabradine (CORLANOR) 7.5 MG TABS tablet Take 1 tablet by mouth 2 times daily with a meal. 60 tablet 3   levothyroxine (SYNTHROID) 100 MCG tablet TAKE 1 TABLET BY MOUTH DAILY ON AN  EMPTY STOMACH WITH ONLY WATER FOR 30 MINUTES & NO ANTACID/CALCIUM/MAGNESIUM FOR 4 HOURS & AVOID BIOTIN 90 tablet 1   midodrine (PROAMATINE) 5 MG tablet Take 1 tablet (5 mg total) by mouth 3 (three) times daily with meals. 90 tablet 6   ondansetron (ZOFRAN) 8 MG tablet as needed.     potassium chloride SA (KLOR-CON M) 20 MEQ tablet Take 1 tablet (20 mEq total) by mouth daily. 15 tablet 6   prochlorperazine (COMPAZINE) 10 MG tablet TAKE 1 TABLET BY MOUTH EVERY 6 HOURS AS NEEDED FOR NAUSEA OR VOMITING. 30 tablet 0   spironolactone (ALDACTONE) 25 MG  tablet TAKE 1/2 TABLET (12.'5MG'$ ) BY MOUTH EVERY DAY 16 tablet 1   sulfamethoxazole-trimethoprim (BACTRIM DS) 800-160 MG tablet Take 1 tablet by mouth 2 (two) times a week. Saturday and Sunday     temazepam (RESTORIL) 15 MG capsule Take 1 capsule (15 mg total) by mouth at bedtime as needed for sleep. 30 capsule 0   torsemide (DEMADEX) 20 MG tablet Take 1 tablet by mouth every other day. 30 tablet 6   valACYclovir (VALTREX) 500 MG tablet Take 500 mg by mouth 2 (two) times daily.     No current facility-administered medications for this encounter.   Allergies  Allergen Reactions   Other     Chemo from Jan - May 2021 caused acute heart failure    Ppd [Tuberculin Purified Protein Derivative] Swelling    Positive reaction.  Negative Chest Xray 06/16/12   Social History   Socioeconomic History   Marital status: Married    Spouse name: Not on file   Number of children: 1   Years of education: Not on file   Highest education level: Not on file  Occupational History   Occupation: Theatre manager  Tobacco Use   Smoking status: Former    Years: 15.00    Types: Cigarettes    Quit date: 01/08/1996    Years since quitting: 25.4   Smokeless tobacco: Never   Tobacco comments:    quit 1998 but is exposed to 2nd hand smoke  Vaping Use   Vaping Use: Never used  Substance and Sexual Activity   Alcohol use: No   Drug use: No   Sexual activity: Yes     Partners: Male    Birth control/protection: Post-menopausal  Other Topics Concern   Not on file  Social History Narrative   Married, one daughter, 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   Social Determinants of Health   Financial Resource Strain: Low Risk    Difficulty of Paying Living Expenses: Not very hard  Food Insecurity: No Food Insecurity   Worried About Charity fundraiser in the Last Year: Never true   Arboriculturist in the Last Year: Never true  Transportation Needs: No Transportation Needs   Lack of Transportation (Medical): No   Lack of Transportation (Non-Medical): No  Physical Activity: Not on file  Stress: Not on file  Social Connections: Not on file  Intimate Partner Violence: Not on file   Family History  Problem Relation Age of Onset   Heart disease Mother 5       chf   Diabetes Mother    Thyroid disease Mother    Cancer Mother 19       female/ skin   Hypertension Mother    Liver disease Mother    Hypertension Father    Alcohol abuse Father    Heart disease Father    Breast cancer Sister 49   Diabetes Sister    Hypothyroidism Sister    Hashimoto's thyroiditis Sister    Hyperlipidemia Brother    Hypertension Brother        x 2   Diabetes Brother    Hypothyroidism Brother    Prostate cancer Brother 31   Liver disease Maternal Grandmother    Diabetes Paternal Grandmother    Breast cancer Maternal Aunt    Hyperlipidemia Maternal Aunt    Heart disease Maternal Aunt    Bone cancer Maternal Aunt        metastatic   Breast cancer Niece  36   Colon cancer Neg Hx    Colon polyps Neg Hx    Esophageal cancer Neg Hx    Rectal cancer Neg Hx    Stomach cancer Neg Hx    BP 116/78   Pulse 87   Wt 77.2 kg (170 lb 3.2 oz)   SpO2 95%   BMI 31.13 kg/m   Wt Readings from Last 3 Encounters:  06/01/21 77.2 kg (170 lb 3.2 oz)  05/31/21 77.3 kg (170 lb 6 oz)  05/04/21 78.8 kg (173 lb 12.8 oz)   PHYSICAL EXAM: General:  NAD. No resp  difficulty HEENT: Normal Neck: Supple. No JVD. Carotids 2+ bilat; no bruits. No lymphadenopathy or thryomegaly appreciated. Cor: PMI nondisplaced. Regular rate & rhythm. No rubs, gallops or murmurs. Lungs: Clear Abdomen: Obese, soft,  nontender, nondistended. No hepatosplenomegaly. No bruits or masses. Good bowel sounds. Extremities: No cyanosis, clubbing, rash, edema Neuro: Alert & oriented x 3, cranial nerves grossly intact. Moves all 4 extremities w/o difficulty. Affect pleasant.  ASSESSMENT & PLAN: 1. Chronic Systolic HF  - Echo 6/54 EF 55-60%. RV normal - Echo 6/21 EF 20-25%  moderately reduced RV function with severe MR/TR - Cath 06/25/19 no CAD. - Drop in EF felt most likely from R-CHOP - Echo 05/04/20 w/ EF 50-55% G2DD. RV normal - Echo 9/22 at [facility] EF back down to 40% w/ ? LV thrombus - cMRI 11/22 LVEF 39%, no LV clot - Echo 1/23 [facility]): EF 40-45% - Echo 03/08/21: EF 20-25%, RV mildly reduced, RVSP 44 mmHg - Admitted 3/23, had been off HF meds d/t low BP after recent CAR-T therapy and cytokine storm. Repeat echo, EF <20%.  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 Required milrinone for low-output. Failed initial wean but tolerated 2nd slow wean w/ stable co-ox. - Stable NYHA II, volume stable. - Continue torsemide 20 mg QOD + 20 KCL daily. - Continue Farxiga 10 mg daily. - Continue carvedilol 6.25 mg bid. - Continue spiro 12.5 mg daily.  - Continue ivabradine 7.5 mg bid.  - Continue midodrine 5 mg tid, will not decrease today. Advised to monitor BP closely at home - No ARNi/ARB w/ soft BPs and recent AKI.  - Off Digoxin due to elevated level.  - Repeat echo at next visit. - Recent labs reviewed and looks OK, SCr 1.19, K 4.1   2. H/o VT + PVCs - Previously had Lifevest. - Denies palpitations. RRR on exam.   - On Amio 100 mg bid. LFTs ok on recent labs (5/23)  - TSH today for amio surveillance.   3. Stage IV non-Hodgkin's lymphoma, mantle cell lymphoma   - s/p R-CHOP w/ Neulasta. -  Evidence of disease progression on f/u Chest CT 6/22. - s/p CAR-T therapy. - [facility]ed at [facility]  4. Partial Lt Brachial DVT - Diagnosed 03/13/21. - On Eliquis. Denies abnormal bleeding.    5. Recent AKI  - Resolved. - SCr on labs yesterday 1.19.   6. Sinus tach - Continue digoxin + ivabradine +  beta blocker. - Now on amiodarone.  - Rates improved, 80s today.  7. H/o Hypokalemia  - Recent labs showed K 4.1 - Continue spiro and KCL 20 daily.  8. Nausea - Discussed potential anti-emetic interaction with HF meds with PharmD. - OK to use ondansetron. Will refill.   Follow up next month with Dr. Haroldine Laws + echo, as scheduled.   [place], FNP 06/01/21

## 2021-05-31 ENCOUNTER — Ambulatory Visit: Payer: Medicare Other | Admitting: Adult Health

## 2021-05-31 ENCOUNTER — Encounter: Payer: Self-pay | Admitting: Adult Health

## 2021-05-31 ENCOUNTER — Other Ambulatory Visit: Payer: Medicare Other

## 2021-05-31 ENCOUNTER — Inpatient Hospital Stay (HOSPITAL_BASED_OUTPATIENT_CLINIC_OR_DEPARTMENT_OTHER): Payer: Medicare Other | Admitting: Internal Medicine

## 2021-05-31 ENCOUNTER — Ambulatory Visit: Payer: Medicare Other | Admitting: Internal Medicine

## 2021-05-31 ENCOUNTER — Inpatient Hospital Stay: Payer: Medicare Other

## 2021-05-31 VITALS — BP 105/50 | HR 82 | Temp 95.6°F | Resp 18 | Wt 170.4 lb

## 2021-05-31 DIAGNOSIS — Z452 Encounter for adjustment and management of vascular access device: Secondary | ICD-10-CM | POA: Diagnosis not present

## 2021-05-31 DIAGNOSIS — C8318 Mantle cell lymphoma, lymph nodes of multiple sites: Secondary | ICD-10-CM

## 2021-05-31 DIAGNOSIS — C8313 Mantle cell lymphoma, intra-abdominal lymph nodes: Secondary | ICD-10-CM

## 2021-05-31 DIAGNOSIS — I82622 Acute embolism and thrombosis of deep veins of left upper extremity: Secondary | ICD-10-CM | POA: Insufficient documentation

## 2021-05-31 LAB — CBC WITH DIFFERENTIAL (CANCER CENTER ONLY)
Abs Immature Granulocytes: 0.4 10*3/uL — ABNORMAL HIGH (ref 0.00–0.07)
Basophils Absolute: 0.1 10*3/uL (ref 0.0–0.1)
Basophils Relative: 2 %
Eosinophils Absolute: 0.1 10*3/uL (ref 0.0–0.5)
Eosinophils Relative: 2 %
HCT: 42 % (ref 36.0–46.0)
Hemoglobin: 13.9 g/dL (ref 12.0–15.0)
Immature Granulocytes: 13 %
Lymphocytes Relative: 5 %
Lymphs Abs: 0.1 10*3/uL — ABNORMAL LOW (ref 0.7–4.0)
MCH: 30.4 pg (ref 26.0–34.0)
MCHC: 33.1 g/dL (ref 30.0–36.0)
MCV: 91.9 fL (ref 80.0–100.0)
Monocytes Absolute: 0.5 10*3/uL (ref 0.1–1.0)
Monocytes Relative: 16 %
Neutro Abs: 1.9 10*3/uL (ref 1.7–7.7)
Neutrophils Relative %: 62 %
Platelet Count: 290 10*3/uL (ref 150–400)
RBC: 4.57 MIL/uL (ref 3.87–5.11)
RDW: 14.2 % (ref 11.5–15.5)
Smear Review: NORMAL
WBC Count: 3 10*3/uL — ABNORMAL LOW (ref 4.0–10.5)
nRBC: 0 % (ref 0.0–0.2)

## 2021-05-31 LAB — CMP (CANCER CENTER ONLY)
ALT: 19 U/L (ref 0–44)
AST: 22 U/L (ref 15–41)
Albumin: 4.4 g/dL (ref 3.5–5.0)
Alkaline Phosphatase: 113 U/L (ref 38–126)
Anion gap: 8 (ref 5–15)
BUN: 15 mg/dL (ref 8–23)
CO2: 28 mmol/L (ref 22–32)
Calcium: 9.7 mg/dL (ref 8.9–10.3)
Chloride: 100 mmol/L (ref 98–111)
Creatinine: 1.19 mg/dL — ABNORMAL HIGH (ref 0.44–1.00)
GFR, Estimated: 51 mL/min — ABNORMAL LOW (ref >60)
Glucose, Bld: 95 mg/dL (ref 70–99)
Potassium: 4.1 mmol/L (ref 3.5–5.1)
Sodium: 136 mmol/L (ref 135–145)
Total Bilirubin: 0.5 mg/dL (ref 0.3–1.2)
Total Protein: 7.3 g/dL (ref 6.5–8.1)

## 2021-05-31 LAB — LACTATE DEHYDROGENASE: LDH: 226 U/L — ABNORMAL HIGH (ref 98–192)

## 2021-05-31 NOTE — Progress Notes (Incomplete)
Patient ID: Natalie Baker, female   DOB: 1955-04-20, 66 y.o.   MRN: [id number]  [facility] VISIT AND FOLLOW UP  Assessment and Plan:  Annual Medicare Wellness Visit Due annually  Health maintenance reviewed ***  Atherosclerosis of aorta ([place]) - per CT 12/22/2018 Control blood pressure, cholesterol, glucose, increase exercise.   Systolic CHF ([place]) appears euvolemic,  Dr. Missy Sabins following closely, complicated by recent AKI Emphasized salt restriction, less than 2053m a day. Encouraged daily monitoring of the patient's weight, call office if 5 lb weight loss or gain in a day.  Encouraged regular exercise. If any increasing shortness of breath, swelling, or chest pressure go to ER immediately.  decrease your fluid intake to less than 2 L daily please remember to always increase your potassium intake with any increase of your fluid pill.   Sinus tachycardia ***cardiology following, controlled on amiodarone, not tolerating BB due to low BPs  DVT of left brachial vein, chronicity unspecified ([place] Continues eliquis ***  Other specified hypothyroidism Hypothyroidism-check TSH level, continue medications the same, reminded to take on an empty stomach 30-674ms before food.  - TSH  Hyperlipidemia -continue supplements, check lipids, decrease fatty foods, increase activity.  - if LDL remains above 130 initiate statin discussed - will also coordinate with onc Dr. MoJulien Nordmanno make sure they are on board.  - Lipid panel   Vitamin D deficiency - defer check as never changed dose, she will increase to 10000 IU daily, check next OV  Osteopenia  -Vit D and calcium recommended, vivielle dot - last DEXA 2014? ***, getting DEXA via GYN ***  Morbid Obesity - BMI 36 *** with htn, hld, prediabetes, hld -diet and exercise discussed; add regular exercise in small increments - weight loss encouraged   Medication management - CBC with Differential/Platelet - CMP/GFR -  Magnesium  Prediabetes/family history diabetes - Defer Hemoglobin A1c as just had checked  - weight loss discussed, goal for <190 lb set for next visit ***  Mantle Cell Lymphoma (HCMillhousenDr. MoEarlie Serverollowing, chemotherapy, starting CAR-T  B12 def Continue supplement   ***    Discussed med's effects and SE's. Screening labs and tests as requested with regular follow-up as recommended. Future Appointments  Date Time Provider De[place]5/25/2023  3:00 PM [facility] LAB [facility] None  05/31/2021  3:30 PM [facility]Curt [facility]MD [facility]  06/01/2021 11:00 AM [facility]-HVSC PA/NP MC-HVSC None  06/29/2021  9:00 AM MC ECHO OP 1 [facility]2023 10:20 AM Bensimhon, DaShaune PascalMD MC-HVSC None  12/12/2021  9:00 AM CoLiane ComberNP GAAM-GAAIM None    Plan:   During the course of the visit the patient was educated and counseled about appropriate screening and preventive services including:   Pneumococcal vaccine  Prevnar 13 Influenza vaccine Td vaccine Screening electrocardiogram Bone densitometry screening Colorectal cancer screening Diabetes screening Glaucoma screening Nutrition counseling  Advanced directives: requested   HPI  [age].o. female  presents for Welcome to Medicare and follow up. She has Hyperlipidemia; Vitamin D deficiency; Hypothyroidism; Morbid obesity (HCClevelandBMI 35+ with prediabetes, hyperlipidemia, htn; Osteoporosis; Prediabetes; Labile hypertension; Aortic atherosclerosis ([place]- per CT 12/22/2018; Mantle cell lymphoma (HC[place] Goals of care, counseling/discussion; Encounter for antineoplastic chemotherapy; Port-A-Cath in place; Tachycardia; Systolic CHF (HCKaplan and B1N27eficiency on their problem list.  She is married, 1 child, no grandchildren. 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 She is followed by GYN Dr. EvSelinda Flavingetting pelvics and mammograms there; hx  of TAH for endometriosis; She has hx of osteopenia is on  vivelle dot.   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 Earlie Server, undergoing chemotherapy by port and getting regular  labs through cancer center. Underwent CAR-T with [facility] Jan 2023, unfortunately was admitted with cytokine storm at [facility], low BP, ECHO 01/24/2021 with EF of 40-45%, HF meds were held. She was found to be in acute on chronic CHF, ECHO there was EF <20%, started on milrinone, eventually weaned off, was back on torsemide but had AKI. Had frequent PVCs and runs of NSVT and was initiated on amiodarone, also on 03/13/2021 found to have age indeterminate L brachial DVT and was initiated on eliquis. Continues to follow closely with cardiologist Dr. Haroldine Laws. She currently continues with midodrine 5 mg TID, torsemide 20 mg, spironolactone 25 mg, farxiga 10 mg ***  Lab Results  Component Value Date   CREATININE 1.27 (H) 05/18/2021   CREATININE 1.14 (H) 04/26/2021   CREATININE 1.44 (H) 03/29/2021   BMI is There is no height or weight on file to calculate BMI., she has been working on diet, cut out red meat, pushing fruits and veggies and admits minimal exercise due to fatigue/treatments, but needs to start walking. Also has stationary cycle. She does have walking paths near her home.  Wt Readings from Last 3 Encounters:  05/04/21 173 lb 12.8 oz (78.8 kg)  04/05/21 174 lb 9.6 oz (79.2 kg)  03/29/21 176 lb 1.6 oz (79.9 kg)   Her blood pressure has been controlled at home, today their BP is  .   She does not workout. She denies chest pain, dizziness.   She has aortic atherosclerosis per CT 12/2018.   She is not on cholesterol medication (has been off due to ongoing mantle cell lymphoma treatments) and denies myalgias. Her cholesterol is not at goal. The cholesterol last visit was:  Lab Results  Component Value Date   CHOL 202 (H) 12/12/2020   HDL 32 (L) 12/12/2020   LDLCALC 144 (H) 12/12/2020   TRIG 133 12/12/2020   CHOLHDL 6.3 (H)  12/12/2020  . She has been working on diet and exercise for hx of prediabetes,  and denies foot ulcerations, hyperglycemia, hypoglycemia , increased appetite, nausea, paresthesia of the feet, polydipsia, polyuria, visual disturbances, vomiting and weight loss. Last A1C in the office was:  Lab Results  Component Value Date   HGBA1C 5.3 09/21/2020   She has CKD III, monitored closely by cardiology and onc.  Lab Results  Component Value Date   EGFR 60 12/12/2020   Patient is on Vitamin D supplement, 5000 IU daily, hasn't changed since last, agreeable to increasing dose Lab Results  Component Value Date   VD25OH 36 12/12/2020     She is on thyroid medication. Her medication was not changed last visit. Taking *** Lab Results  Component Value Date   TSH 0.792 03/10/2021    Lab Results  Component Value Date   [id number] 818 12/12/2020      Current Medications:  Current Outpatient Medications on File Prior to Visit  Medication Sig Dispense Refill  . amiodarone (PACERONE) 200 MG tablet Patient takes 1/2 tablet by mouth twice a day.    Marland Kitchen apixaban (ELIQUIS) 5 MG TABS tablet Patient takes 1/2 tablet by mouth  twice a day.    . cholecalciferol (VITAMIN D3) 25 MCG (1000 UNIT) tablet Take 1,000 Units by mouth daily.    Marland Kitchen estradiol (VIVELLE-DOT) 0.05  MG/24HR patch Place 1 patch onto the skin 2 (two) times a week.    Marland Kitchen FARXIGA 10 MG TABS tablet TAKE 1 TABLET BY MOUTH EVERY DAY 30 tablet 11  . ivabradine (CORLANOR) 7.5 MG TABS tablet Take 1 tablet by mouth 2 times daily with a meal. 60 tablet 3  . levothyroxine (SYNTHROID) 100 MCG tablet TAKE 1 TABLET BY MOUTH DAILY ON AN EMPTY STOMACH WITH ONLY WATER FOR 30 MINUTES & NO ANTACID/CALCIUM/MAGNESIUM FOR 4 HOURS & AVOID BIOTIN 90 tablet 1  . midodrine (PROAMATINE) 5 MG tablet Take 1 tablet (5 mg total) by mouth 3 (three) times daily with meals. 90 tablet 6  . potassium chloride SA (KLOR-CON M) 20 MEQ tablet Take 1 tablet (20 mEq total) by mouth  daily. 15 tablet 6  . prochlorperazine (COMPAZINE) 10 MG tablet TAKE 1 TABLET BY MOUTH EVERY 6 HOURS AS NEEDED FOR NAUSEA OR VOMITING. 30 tablet 0  . spironolactone (ALDACTONE) 25 MG tablet TAKE 1/2 TABLET (12.5MG) BY MOUTH EVERY DAY 16 tablet 1  . sulfamethoxazole-trimethoprim (BACTRIM DS) 800-160 MG tablet Take 1 tablet by mouth 2 (two) times a week. Saturday and Sunday    . temazepam (RESTORIL) 15 MG capsule Take 1 capsule (15 mg total) by mouth at bedtime as needed for sleep. 30 capsule 0  . torsemide (DEMADEX) 20 MG tablet Take 1 tablet by mouth every other day. 30 tablet 6  . valACYclovir (VALTREX) 500 MG tablet Take 500 mg by mouth 2 (two) times daily.     No current facility-administered medications on file prior to visit.    Health Maintenance:   Immunization History  Administered Date(s) Administered  . Influenza Inj Mdck Quad Pf 10/04/2017  . Influenza Split 11/06/2010  . Influenza, High Dose Seasonal PF 12/12/2020  . Influenza, Seasonal, Injecte, Preservative Fre 01/29/2016  . Influenza,inj,Quad PF,6+ Mos 01/27/2019  . PNEUMOCOCCAL CONJUGATE-20 12/12/2020  . Tdap 07/29/2016   Health Maintenance  Topic Date Due  . COVID-19 Vaccine (1) Never done  . Zoster Vaccines- Shingrix (1 of 2) Never done  . INFLUENZA VACCINE  08/07/2021  . MAMMOGRAM  12/28/2022  . TETANUS/TDAP  07/30/2026  . COLONOSCOPY (Pts 45-28yr Insurance coverage will need to be confirmed)  03/19/2028  . Pneumonia Vaccine 66 Years old  Completed  . DEXA SCAN  Completed  . Hepatitis C Screening  Completed  . HIV Screening  Completed  . HPV VACCINES  Aged Out   Shingles declines Covid 117 has had, req dates  Pap: Goes to GYN, pelvics only, had hysterectomy MGM: at GYN, did have 12/27/2020 at breast center DEXA: 2014, hx of osteopenia per GYN note *** Colonoscopy: 03/2018 due 2030  Last Dental Exam: Dr. ILaurena Bering 2022 Last Eye Exam: Dr. GDelman Cheadle 2022 Last derm: Dr. LAllyson Sabal last ? 2020 ? Has been since then    Patient Care Team: MUnk Pinto MD as PCP - General (Internal Medicine) AElouise Munroe MD as PCP - Cardiology (Cardiology) GGatha Mayer MD as Consulting Physician (Gastroenterology) RPaula Compton MD as Consulting Physician (Obstetrics and Gynecology) LDruscilla Brownie MD as Consulting Physician (Dermatology)   Medical History:  Past Medical History:  Diagnosis Date  . Allergy   . Cancer ([facility]    [place] . CHF (congestive heart failure) ([place]off[place]   . Colon polyp 12/29/2006  . Dysrhythmia   . 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 Gallstones 08/17/2020  . Hyperlipidemia   . Hypothyroidism   . Obesity  BMI 34  . Osteoporosis   . Palpitations   . Pre-diabetes   . Vitamin D deficiency    Allergies Allergies  Allergen Reactions  . Other     Chemo from Jan - May 2021 caused acute heart failure   . Ppd [Tuberculin Purified Protein Derivative] Swelling    Positive reaction.  Negative Chest Xray 06/16/12    SURGICAL HISTORY She  has a past surgical history that includes Abdominal hysterectomy (1997); Cesarean section (1983); Colonoscopy w/ biopsies (12/29/2006); laparoscopy; Upper gastrointestinal endoscopy; IR IMAGING GUIDED PORT INSERTION (01/15/2019); RIGHT/LEFT HEART CATH AND CORONARY ANGIOGRAPHY (N/A, 06/25/2019); and Cholecystectomy (N/A, 09/25/2020). FAMILY HISTORY Her family history includes Alcohol abuse in her father; Bone cancer in her maternal aunt; Breast cancer in her maternal aunt; Breast cancer (age of onset: 71) in her niece; Breast cancer (age of onset: 17) in her sister; Cancer (age of onset: 80) in her mother; Diabetes in her brother, mother, paternal grandmother, and sister; Hashimoto's thyroiditis in her sister; Heart disease in her father and maternal aunt; Heart disease (age of onset: 80) in her mother; Hyperlipidemia in her brother and maternal aunt; Hypertension in her brother, father, and mother; Hypothyroidism in her brother and  sister; Liver disease in her maternal grandmother and mother; Prostate cancer (age of onset: 70) in her brother; Thyroid disease in her mother. SOCIAL HISTORY She  reports that she quit smoking about 25 years ago. Her smoking use included cigarettes. She has never used smokeless tobacco. She reports that she does not drink alcohol and does not use drugs.  Review of Systems: Review of Systems  Constitutional:  Negative for chills, fever, malaise/fatigue and weight loss.  HENT:  Negative for congestion, ear pain, hearing loss, sore throat and tinnitus.   Eyes: Negative.  Negative for blurred vision and double vision.  Respiratory:  Negative for cough, sputum production, shortness of breath and wheezing.   Cardiovascular:  Negative for chest pain, palpitations, orthopnea, claudication, leg swelling and PND.  Gastrointestinal:  Negative for abdominal pain, blood in stool, constipation, diarrhea, heartburn, melena, nausea and vomiting.  Genitourinary: Negative.   Musculoskeletal:  Negative for falls, joint pain, myalgias and neck pain.  Skin:  Negative for itching and rash.  Neurological:  Negative for dizziness, tingling, sensory change, loss of consciousness, weakness and headaches.  Endo/Heme/Allergies:  Negative for polydipsia.  Psychiatric/Behavioral: Negative.  Negative for depression, memory loss, substance abuse and suicidal ideas. The patient is not nervous/anxious and does not have insomnia.   All other systems reviewed and are negative.  Physical Exam: Estimated body mass index is 31.79 kg/m as calculated from the following:   Height as of 03/29/21: '5\' 2"'  (1.575 m).   Weight as of 05/04/21: 173 lb 12.8 oz (78.8 kg). There were no vitals taken for this visit.  General Appearance: Well nourished well developed, in no apparent distress.  Eyes: PERRLA, EOMs, conjunctiva no swelling or erythema ENT/Mouth: Ear canals normal without obstruction, swelling, erythema, or discharge.  TMs normal  bilaterally with no erythema, bulging, retraction, or loss of landmark.  Oropharynx moist and clear with no exudate, erythema, or swelling.   Neck: Supple, thyroid normal. No bruits.  No cervical adenopathy Respiratory: Respiratory effort normal, Breath sounds clear A&P without wheeze, rhonchi, rales.   Cardio: RRR without murmurs, rubs or gallops, occ extra beat. Brisk peripheral pulses without edema.  Chest: symmetric, with normal excursions Breasts: defer to GYN, getting mammograms Abdomen: Morbidly obese, Soft, nontender, no guarding, rebound, hernias, masses, or  organomegaly.  Lymphatics: Non tender without lymphadenopathy.  Musculoskeletal: Full ROM all peripheral extremities,5/5 strength, and normal gait.  Skin: Warm, dry without rashes, lesions, ecchymosis. Neuro: Awake and oriented X 3, Cranial nerves intact, reflexes equal bilaterally. Normal muscle tone, no cerebellar symptoms. Sensation intact.  Psych:  normal affect, Insight and Judgment appropriate.  GU: defer to GYN  EKG: defer - just had 12/04/2020, reviewed  Over 40 minutes of exam, counseling, chart review and critical decision making was performed  Gorden Harms Zakry Caso 11:40 AM [facility]

## 2021-05-31 NOTE — Progress Notes (Signed)
[place] Telephone:([phone]   [phone]  OFFICE PROGRESS NOTE  Natalie Pinto, MD [address] [place] [address]  DIAGNOSIS: Stage IV non-Hodgkin's lymphoma, mantle cell lymphoma.  She presented with left iliac and retroperitoneal lymphadenopathy.  She also presented with left axillary lymphadenopathy and bone marrow involvement.  She was diagnosed in December 2020.   PRIOR THERAPY:  1) Systemic chemotherapy with R-CHOP with Neulasta support.  First dose on 01/27/2019. Status post 6 cycles.   2) Acalabrutinib 100 mg p.o. twice daily.  First dose started June 17, 2020.  Status post 3 months of treatment. 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 female returns to the clinic today for follow-up visit.  The patient is feeling fine today with no concerning complaints except for the intermittent nausea.  She has Compazine and Zofran at home but she does not take them at regular basis because of concern about interaction with her cardiac medication.  She is seeing Dr. BeHaroldine Lawsomorrow and she will ask her about the best option that would not interact with her cardiac medicine.  She denied having any current chest pain, shortness of breath, cough or hemoptysis.  She has no abdominal pain, diarrhea or constipation.  She has no weight loss or night sweats.  She is here today for evaluation and repeat blood work.    MEDICAL HISTORY: Past Medical History:  Diagnosis Date   Allergy    [place] ([place]   being worked up for lymphoma   CHF (congestive heart failure) (HCMarica[place]   Colon polyp 12/29/2006   Dysrhythmia     Frozen shoulder 11/16/2015   Gallstones 08/17/2020   Hyperlipidemia    Hypothyroidism    Obesity    BMI 34   Osteoporosis    Palpitations    Pre-diabetes    Vitamin D deficiency     ALLERGIES:  is allergic to other and ppd [tuberculin purified protein derivative].  MEDICATIONS:  Current Outpatient Medications  Medication Sig Dispense Refill   amiodarone (PACERONE) 200 MG tablet Patient takes 1/2 tablet by mouth twice a day.     apixaban (ELIQUIS) 5 MG TABS tablet Patient takes 1/2 tablet by mouth  twice a day.     cholecalciferol (VITAMIN D3) 25 MCG (1000 UNIT) tablet Take 1,000 Units by mouth daily.     estradiol (VIVELLE-DOT) 0.05 MG/24HR patch Place 1 patch onto the skin 2 (two) times a week.     FARXIGA 10 MG TABS tablet TAKE 1 TABLET BY MOUTH EVERY DAY 30 tablet 11   ivabradine (CORLANOR) 7.5 MG TABS tablet Take 1 tablet by mouth 2 times daily with a meal. 60 tablet 3   levothyroxine (SYNTHROID) 100 MCG tablet TAKE 1 TABLET BY MOUTH DAILY ON AN EMPTY STOMACH WITH ONLY WATER FOR 30 MINUTES & NO ANTACID/CALCIUM/MAGNESIUM FOR 4 HOURS & AVOID BIOTIN 90 tablet 1   midodrine (PROAMATINE) 5 MG tablet Take 1 tablet (5 mg total) by mouth 3 (three) times daily with meals. 90 tablet 6   potassium chloride SA (KLOR-CON M) 20 MEQ tablet Take 1 tablet (20 mEq total) by mouth daily. 15 tablet 6  prochlorperazine (COMPAZINE) 10 MG tablet TAKE 1 TABLET BY MOUTH EVERY 6 HOURS AS NEEDED FOR NAUSEA OR VOMITING. 30 tablet 0   spironolactone (ALDACTONE) 25 MG tablet TAKE 1/2 TABLET (12.5MG) BY MOUTH EVERY DAY 16 tablet 1   sulfamethoxazole-trimethoprim (BACTRIM DS) 800-160 MG tablet Take 1 tablet by mouth 2 (two) times a week. Saturday and _0 91 for emdometriosis   RIGHT/LEFT HEART CATH AND CORONARY ANGIOGRAPHY N/A 06/25/2019   Procedure: RIGHT/LEFT HEART CATH AND CORONARY ANGIOGRAPHY;  Surgeon: Jolaine Artist, MD;  Location: [place] [facility];  Service: Cardiovascular;  Laterality: N/A;   UPPER GASTROINTESTINAL ENDOSCOPY      REVIEW OF SYSTEMS:  A comprehensive review of systems was negative except for: Gastrointestinal: positive for nausea   PHYSICAL EXAMINATION: General appearance: alert, cooperative, and no distress Head: Normocephalic, without obvious abnormality, atraumatic Neck: no adenopathy, no JVD, supple, symmetrical, trachea midline, and thyroid not enlarged, symmetric, no tenderness/mass/nodules Lymph nodes: Cervical, supraclavicular, and axillary nodes normal. Resp: clear to auscultation bilaterally Back: symmetric, no curvature. ROM normal. No CVA tenderness. Cardio: regular rate and rhythm, S1, S2 normal, no murmur, click, rub or gallop GI: soft, non-tender; bowel sounds normal; no masses,  no organomegaly Extremities: extremities normal, atraumatic, no cyanosis or edema  ECOG PERFORMANCE STATUS: 1 - Symptomatic but completely ambulatory  Blood pressure (!) 105/50, pulse 82, temperature (!) 95.6 F (35.3 C), temperature source Tympanic, resp. rate 18, weight 170 lb 6 oz (77.3 kg), SpO2 96 %.  LABORATORY DATA: Lab Results  Component Value Date   WBC 3.0 (L) 05/31/2021   HGB 13.9 05/31/2021   HCT  42.0 05/31/2021   MCV 91.9 05/31/2021   PLT 290 05/31/2021      Chemistry      Component Value Date/Time   NA 139 05/18/2021 1014   K 3.3 (L) 05/18/2021 1014   CL 101 05/18/2021 1014   CO2 27 05/18/2021 1014   BUN 15 05/18/2021 1014   CREATININE 1.27 (H) 05/18/2021 1014   CREATININE 1.14 (H) 04/26/2021 1051   CREATININE 1.03 12/12/2020 0957      Component Value Date/Time   CALCIUM 9.6 05/18/2021 1014   ALKPHOS 44 04/26/2021 1051   AST 20 04/26/2021 1051   ALT 19 04/26/2021 1051   BILITOT 0.6 04/26/2021 1051       RADIOGRAPHIC STUDIES: No results found.   ASSESSMENT AND PLAN: This is a very pleasant 66 years old white female recently diagnosed with stage IV less aggressive mantle cell lymphoma presented with left iliac and retroperitoneal lymphadenopathy as well as left axillary and bone marrow involvement in December 2020. The patient  completed 6 cycles of systemic chemotherapy with R-CHOP.  She tolerated her treatment fairly well with no concerning adverse effects but few months later she developed congestive heart failure with drop in her ejection fraction to the 20-25%.  This has improved over the last few months and currently around 55%.  She is followed by Dr. Haroldine Laws. Unfortunately repeat CT scan in June 2022 showed evidence for disease progression with mild increase in the small bilateral axillary lymph nodes and moderate increase in the left supraclavicular as well as ileocolic mesenteric lymph nodes and left external iliac lymph nodes but mild increase in the periaortic retroperitoneal lymph nodes. The patient is started treatment with acalabrutinib 100 mg p.o. twice daily on June 17, 2020  She was seen by Dr. Laverle Hobby at [facility] on July 12, 2020 for discussion of potential autologous stem cell transplant.   The patient had repeat CT scan of the chest, abdomen pelvis performed recently that showed decrease in the size of mesenteric lymph node/mass of the  right hemiabdomen, left external iliac lymph nodes and lymph nodes adjacent to the pancreatic tail/splenic hilum but there was mild increase in the size of the mediastinal paraesophageal nodes and retroperitoneal periaortic lymph node as well as increase in the size of the spleen. 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 The patient is currently on observation and she is feeling fine with no concerning complaints except for the intermittent nausea but she does not take her nausea medication because of concern about interaction with the cardiac medications. Her blood work today is unremarkable except for the mild leukocytopenia secondary to her treatment with the CAR-T. I recommended for the patient to continue on observation with repeat blood work in 6 weeks. For the nausea, she will check with Dr. Haroldine Laws to see if he has better option for her nausea as it does not interact with the cardiac medications. For the lack of sleep, she is currently on Restoril 15 mg p.o. nightly. The patient was advised to call immediately if she has any other concerning symptoms in the interval. The patient voices understanding of current disease status and treatment options and is in agreement with the current care plan.  All questions were answered. The patient knows to call the clinic with any problems, questions or concerns. We can certainly see the patient much sooner if necessary.  Disclaimer: This note was dictated with voice recognition software. Similar sounding words can inadvertently be transcribed and may not be corrected upon review.

## 2021-06-01 ENCOUNTER — Encounter (HOSPITAL_COMMUNITY): Payer: Self-pay

## 2021-06-01 ENCOUNTER — Ambulatory Visit (HOSPITAL_COMMUNITY)
Admission: RE | Admit: 2021-06-01 | Discharge: 2021-06-01 | Disposition: A | Discharge status: Home / Self Care | Payer: Medicare Other | Source: Ambulatory Visit | Attending: Family Medicine | Admitting: Family Medicine

## 2021-06-01 VITALS — BP 116/78 | HR 87 | Wt 170.2 lb

## 2021-06-01 DIAGNOSIS — I493 Ventricular premature depolarization: Secondary | ICD-10-CM | POA: Diagnosis not present

## 2021-06-01 DIAGNOSIS — R110 Nausea: Secondary | ICD-10-CM

## 2021-06-01 DIAGNOSIS — I5022 Chronic systolic (congestive) heart failure: Secondary | ICD-10-CM | POA: Diagnosis not present

## 2021-06-01 DIAGNOSIS — I110 Hypertensive heart disease with heart failure: Secondary | ICD-10-CM | POA: Diagnosis not present

## 2021-06-01 DIAGNOSIS — Z8639 Personal history of other endocrine, nutritional and metabolic disease: Secondary | ICD-10-CM

## 2021-06-01 DIAGNOSIS — Z86718 Personal history of other venous thrombosis and embolism: Secondary | ICD-10-CM | POA: Diagnosis not present

## 2021-06-01 DIAGNOSIS — N179 Acute kidney failure, unspecified: Secondary | ICD-10-CM

## 2021-06-01 DIAGNOSIS — I472 Ventricular tachycardia, unspecified: Secondary | ICD-10-CM | POA: Diagnosis not present

## 2021-06-01 DIAGNOSIS — Z79899 Other long term (current) drug therapy: Secondary | ICD-10-CM | POA: Diagnosis not present

## 2021-06-01 DIAGNOSIS — C8310 Mantle cell lymphoma, unspecified site: Secondary | ICD-10-CM | POA: Diagnosis not present

## 2021-06-01 DIAGNOSIS — Z7901 Long term (current) use of anticoagulants: Secondary | ICD-10-CM | POA: Diagnosis not present

## 2021-06-01 DIAGNOSIS — I3139 Other pericardial effusion (noninflammatory): Secondary | ICD-10-CM | POA: Insufficient documentation

## 2021-06-01 DIAGNOSIS — R000 Tachycardia, unspecified: Secondary | ICD-10-CM

## 2021-06-01 DIAGNOSIS — I82622 Acute embolism and thrombosis of deep veins of left upper extremity: Secondary | ICD-10-CM | POA: Diagnosis not present

## 2021-06-01 LAB — TSH: TSH: 0.358 u[IU]/mL (ref 0.350–4.500)

## 2021-06-01 MED ORDER — ONDANSETRON 4 MG PO TBDP: 4.0000 mg | ORAL_TABLET | Freq: Three times a day (TID) | ORAL | 3 refills | Status: DC | PRN

## 2021-06-01 NOTE — Patient Instructions (Addendum)
Thank you for coming in today  Labs were done today, if any labs are abnormal the clinic will call you No news is good news  Your physician recommends that you schedule a follow-up appointment in:  Keep follow up with Dr. Haroldine Laws  START Zofran ODT 4 mg as needed for nausea  At the [facility], you and your health needs are our priority. As part of our continuing mission to provide you with exceptional heart care, we have created designated Provider Care Teams. These Care Teams include your primary Cardiologist (physician) and Advanced Practice Providers (APPs- Physician Assistants and Nurse Practitioners) who all work together to provide you with the care you need, when you need it.   You may see any of the following providers on your designated Care Team at your next follow up: Dr Glori Bickers Dr Haynes Kerns, NP Lyda Jester, [place] [facility] [place], [place] Audry Riles, PharmD   Please be sure to bring in all your medications bottles to every appointment.   If you have any questions or concerns before your next appointment please send [place] a message through [place] or call our office at [phone].    TO LEAVE A MESSAGE FOR THE NURSE SELECT OPTION 2, PLEASE LEAVE A MESSAGE INCLUDING: YOUR NAME DATE OF BIRTH CALL BACK NUMBER REASON FOR CALL**this is important as we prioritize the call backs  YOU WILL RECEIVE A CALL BACK THE SAME DAY AS LONG AS YOU CALL BEFORE 4:00 PM

## 2021-06-07 ENCOUNTER — Other Ambulatory Visit (HOSPITAL_COMMUNITY): Payer: Self-pay

## 2021-06-07 MED ORDER — SPIRONOLACTONE 25 MG PO TABS
ORAL_TABLET | ORAL | 1 refills | Status: DC
  Filled 2021-06-07: qty 45, 90d supply, fill #0

## 2021-06-10 ENCOUNTER — Other Ambulatory Visit (HOSPITAL_COMMUNITY): Payer: Self-pay | Admitting: Internal Medicine

## 2021-06-28 ENCOUNTER — Other Ambulatory Visit: Payer: Medicare Other

## 2021-06-28 ENCOUNTER — Ambulatory Visit: Payer: Medicare Other | Admitting: Internal Medicine

## 2021-06-29 ENCOUNTER — Other Ambulatory Visit (HOSPITAL_COMMUNITY): Payer: Self-pay | Admitting: *Deleted

## 2021-06-29 ENCOUNTER — Other Ambulatory Visit (HOSPITAL_COMMUNITY): Payer: Self-pay | Admitting: Internal Medicine

## 2021-06-29 ENCOUNTER — Encounter (HOSPITAL_COMMUNITY): Payer: Self-pay | Admitting: Internal Medicine

## 2021-06-29 ENCOUNTER — Ambulatory Visit (HOSPITAL_COMMUNITY)
Admission: RE | Admit: 2021-06-29 | Discharge: 2021-06-29 | Disposition: A | Discharge status: Home / Self Care | Payer: Medicare Other | Source: Ambulatory Visit | Attending: Internal Medicine | Admitting: Internal Medicine

## 2021-06-29 ENCOUNTER — Ambulatory Visit (HOSPITAL_BASED_OUTPATIENT_CLINIC_OR_DEPARTMENT_OTHER)
Admission: RE | Admit: 2021-06-29 | Discharge: 2021-06-29 | Disposition: A | Discharge status: Home / Self Care | Payer: Medicare Other | Source: Ambulatory Visit

## 2021-06-29 VITALS — BP 102/70 | HR 72 | Wt 171.6 lb

## 2021-06-29 DIAGNOSIS — Z8679 Personal history of other diseases of the circulatory system: Secondary | ICD-10-CM | POA: Insufficient documentation

## 2021-06-29 DIAGNOSIS — I340 Nonrheumatic mitral (valve) insufficiency: Secondary | ICD-10-CM | POA: Diagnosis not present

## 2021-06-29 DIAGNOSIS — Z7984 Long term (current) use of oral hypoglycemic drugs: Secondary | ICD-10-CM | POA: Diagnosis not present

## 2021-06-29 DIAGNOSIS — Z7901 Long term (current) use of anticoagulants: Secondary | ICD-10-CM | POA: Insufficient documentation

## 2021-06-29 DIAGNOSIS — I5042 Chronic combined systolic (congestive) and diastolic (congestive) heart failure: Secondary | ICD-10-CM | POA: Insufficient documentation

## 2021-06-29 DIAGNOSIS — I5022 Chronic systolic (congestive) heart failure: Secondary | ICD-10-CM | POA: Diagnosis present

## 2021-06-29 DIAGNOSIS — I82622 Acute embolism and thrombosis of deep veins of left upper extremity: Secondary | ICD-10-CM | POA: Insufficient documentation

## 2021-06-29 DIAGNOSIS — I110 Hypertensive heart disease with heart failure: Secondary | ICD-10-CM | POA: Diagnosis not present

## 2021-06-29 DIAGNOSIS — Z79899 Other long term (current) drug therapy: Secondary | ICD-10-CM | POA: Insufficient documentation

## 2021-06-29 DIAGNOSIS — I472 Ventricular tachycardia, unspecified: Secondary | ICD-10-CM | POA: Diagnosis not present

## 2021-06-29 DIAGNOSIS — I428 Other cardiomyopathies: Secondary | ICD-10-CM | POA: Diagnosis not present

## 2021-06-29 DIAGNOSIS — C8310 Mantle cell lymphoma, unspecified site: Secondary | ICD-10-CM | POA: Diagnosis not present

## 2021-06-29 LAB — CBC
HCT: 41.5 % (ref 36.0–46.0)
Hemoglobin: 13.4 g/dL (ref 12.0–15.0)
MCH: 30.2 pg (ref 26.0–34.0)
MCHC: 32.3 g/dL (ref 30.0–36.0)
MCV: 93.7 fL (ref 80.0–100.0)
Platelets: 290 10*3/uL (ref 150–400)
RBC: 4.43 MIL/uL (ref 3.87–5.11)
RDW: 15.5 % (ref 11.5–15.5)
WBC: 2.8 10*3/uL — ABNORMAL LOW (ref 4.0–10.5)
nRBC: 0 % (ref 0.0–0.2)

## 2021-06-29 LAB — BASIC METABOLIC PANEL
Anion gap: 8 (ref 5–15)
BUN: 17 mg/dL (ref 8–23)
CO2: 27 mmol/L (ref 22–32)
Calcium: 9.5 mg/dL (ref 8.9–10.3)
Chloride: 103 mmol/L (ref 98–111)
Creatinine, Ser: 1.15 mg/dL — ABNORMAL HIGH (ref 0.44–1.00)
GFR, Estimated: 53 mL/min — ABNORMAL LOW (ref >60)
Glucose, Bld: 88 mg/dL (ref 70–99)
Potassium: 4.3 mmol/L (ref 3.5–5.1)
Sodium: 138 mmol/L (ref 135–145)

## 2021-06-29 LAB — ECHOCARDIOGRAM COMPLETE
Area-P 1/2: 4.06 cm2
Calc EF: 38.4 %
MV M vel: 4.19 m/s
MV Peak grad: 70.2 mmHg
MV VTI: 1.78 cm2
Radius: 0.6 cm
S' Lateral: 3.95 cm
Single Plane A2C EF: 35.7 %
Single Plane A4C EF: 41.4 %

## 2021-06-29 NOTE — H&P (View-Only) (Signed)
Advanced Heart Failure Clinic Note   Referring Physician: PCP: [name], MD PCP-Cardiologist: Elouise Munroe, MD  [facility]: Dr. Haroldine Laws   Reason for Visit:  F/u for Chronic Systolic Heart Failure  HPI:  Natalie Baker is a 66 y.o. female with a hx of palpitations thought to be PVCs, HTN, non hodgkins lymphoma treated w/ R-CHOP and systolic HF.   Admitted 6/21 with acute HF. ECHO  EF 20-25% w/ mod-severe MR.  RV moderately reduced. Developed VT and was started on amiodarone and transferred to [facility] for [facility] which showed normal coronaries, elevated filling pressures w/ low CO c/w cardiogenic shock. CI was 1.7. She was started on milrinone, IV amiodarone and IV Lasix for diuresis.   Echo 9/21: EF 40-45%   On repeat echo 05/04/20 EF had normalized to 50-55% G2DD.    At oncology f/u 6/22,  repeat CT scan unfortunately showed evidence for disease progression with mild increase in the small bilateral axillary lymph nodes and moderate increase in the left supraclavicular as well as ileocolic mesenteric lymph nodes and left external iliac lymph nodes but mild increase in the periaortic retroperitoneal lymph nodes. She started treatment with acalabrutinib 100 mg p.o. twice daily on June 17 2020 and Dr. Julien Nordmann referred her to [facility] for bone marrow transplant evaluation. As part of w/u at [facility], an echocardiogram was obtained and showed drop in LVEF back down to 40% w/ echodensity vs thickening of the left ventricular apex concerning for possible LV thrombus. Subsequently, she was referred back to [facility] for further evaluation.    Had follow up 10/22, and endorsed stable NYHA II symptoms. Losartan was increased and cMRI arranged to assess for LV thrombus.    cMRI 11/22 LVEF 39%, no LV thrombus noted.   Follow up 11/22 Not a candidate for stem cell transplant, but will trial CAR-T therapy soon.    Recently d/c from [facility] after CAR-T therapy, had cytokine storm, taken off all HF meds due to low BP.  Echo 1/23 with EF 40-45%.    Most recently, she was direct admitted from HF clinic on 03/07/21 with acute on chronic systolic CHF with low-output. Bedside echo with EF < 20%. PICC line placed. Initial co-ox 34% and she was started on inotrope support with milrinone. Diuresed with IV lasix then transitioned to po torsemide. She failed initial milrinone wean then was eventually titrated off milrinone with stable co-ox. Torsemide decreased to 20 mg daily (starting 03/15) at discharge d/t AKI and low volume. Scr 1.24>>1.64 (baseline 1.0). She was started on GDMT. Had frequent PVCs and runs of NSVT which improved with addition of po amiodarone. Course also complicated by left brachial DVT for which she was started on Eliquis. Discharged on 3/13. D/c wt 172 lb.   At post hospital f/u, she was doing well. Wt only up 2 lb since d/c, 172>>174 lb. ReDs Clip 32%. BP stable, 124/78. EKG showed NSR. No PVCs. NYHA class II. However, her labs returned abnormal. Digoxin level was elevated at 2.5 (EKG ok), Scr had risen further from 1.64>>1.73. She was instructed to stop Digoxin and to hold torsemide x 1 day, then reduced frequency to qod dosing. She had repeat labs on 3/20 which showed Dig level down to 0.5.  SCr back down to 1.6 w/ low K at 3.1. She was instructed to remain off dig and spiro increased to 25 mg daily.   Since that time, she had f/u at the cancer center on 3/23 and had repeat labs. SCr  down to 1.44, K 4.0. Per Dr. Worthy Flank note, she remains on observation w/ plans to continue monthly blood work and f/u w/ him in 2 months.   Echo 3/23 EF 20-25%   She returns back today for f/u. Feels good. Currently not receiving any chemo. Doing activities without problem. Working as Theatre manager. No edema, orthopnea or PND.    Echo today 06/29/21 EF 35-40% (read as 30-35%) severe MR Personally reviewed    Cardiac/Vascular Studies: Echo 06/2019 EF 20-25%  moderately reduced RV function with severe  MR/TR Echo 9/21: EF 40-45%, RV normal  Echo  4/22: EF 50-55% G2DD. RV normal  Echo [facility] 10/04/20: EF 40%, grade I DD, RV ok, mild MR, mild LAE, small circumferential pericardial effusion, Echodensity vs thickening of the left ventricular apex, cannot rule out thrombus Echo 3/23 LVEF 20-25%, RV mildly reduced. Small pericardial effusion, no tamponade    cMRI 11/22 EF 39%, no LV thrombus   Cath 6/18 with normal cors. Low output. Ao = 109/71 (81) LV =  99/23 RA =  15 RV =  43/18 PA =  35/14 (25) PCW = 22 Fick cardiac output/index = 3.3/1.7 PVR = < 1.0 WU Ao sat = 100% PA sat = 49%, 53%    Assessment: 1. Normal coronary arteries 2. Severe NICM EF 20% 3. Elevated filling pressures and low CO c/w cardiogenic shock 4. Brief run of VT   Venous duplex LUE, 03/13/21: Summary:  Right:  No evidence of thrombosis in the subclavian.     Left:  Findings consistent with partial age indeterminate deep vein thrombosis  involving the left brachial veins.   Review of systems complete and found to be negative unless listed in HPI.     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     apixaban (ELIQUIS) 5 MG TABS tablet Take 5 mg by mouth 2 (two) times daily.     cholecalciferol (VITAMIN D3) 25 MCG (1000 UNIT) tablet Take 1,000 Units by mouth daily.     estradiol (VIVELLE-DOT) 0.05 MG/24HR patch Place 1 patch onto the skin 2 (two) times a week.     FARXIGA 10 MG TABS tablet TAKE 1 TABLET BY MOUTH EVERY DAY 30 tablet 11    ivabradine (CORLANOR) 7.5 MG TABS tablet Take 1 tablet by mouth 2 times daily with a meal. 60 tablet 3   levothyroxine (SYNTHROID) 100 MCG tablet TAKE 1 TABLET BY MOUTH DAILY ON AN EMPTY STOMACH WITH ONLY WATER FOR 30 MINUTES & NO ANTACID/CALCIUM/MAGNESIUM FOR 4 HOURS & AVOID BIOTIN 90 tablet 1   midodrine (PROAMATINE) 5 MG tablet Take 5 mg by mouth 2 (two) times daily with a meal.     ondansetron (ZOFRAN) 8 MG tablet as needed.     ondansetron (ZOFRAN-ODT) 4 MG disintegrating tablet Take 1 tablet (4 mg total) by mouth every 8 (eight) hours as needed for nausea or vomiting.  30 tablet 3   potassium chloride SA (KLOR-CON M) 20 MEQ tablet Take 20 mEq by mouth every other day.     prochlorperazine (COMPAZINE) 10 MG tablet TAKE 1 TABLET BY MOUTH EVERY 6 HOURS AS NEEDED FOR NAUSEA OR VOMITING. 30 tablet 0   spironolactone (ALDACTONE) 25 MG tablet TAKE 1/2 TABLET (12.'5MG'$ ) BY MOUTH EVERY DAY 45 tablet 1   sulfamethoxazole-trimethoprim (BACTRIM DS) 800-160 MG tablet Take 1 tablet by mouth 2 (two) times a week. Saturday and Sunday     temazepam (RESTORIL) 15 MG capsule Take 1 capsule (15 mg total) by mouth at bedtime as needed for sleep. 30 capsule 0   torsemide (DEMADEX) 20 MG tablet Take 1 tablet by mouth every other day. 30 tablet 6   valACYclovir (VALTREX) 500 MG tablet Take 500 mg by mouth 2 (two) times daily.     No current facility-administered medications for this encounter.    Allergies  Allergen Reactions   Other     Chemo from Jan - May 2021 caused acute heart failure    Ppd [Tuberculin Purified Protein Derivative] Swelling    Positive reaction.  Negative Chest Xray 06/16/12      Social History   Socioeconomic History   Marital status: Married    Spouse name: Not on file   Number of children: 1   Years of education: Not on file   Highest education level: Not on file  Occupational History   Occupation: Theatre manager  Tobacco Use   Smoking status: Former    Years: 15.00     Types: Cigarettes    Quit date: 01/08/1996    Years since quitting: 25.4   Smokeless tobacco: Never   Tobacco comments:    quit 1998 but is exposed to 2nd hand smoke  Vaping Use   Vaping Use: Never used  Substance and Sexual Activity   Alcohol use: No   Drug use: No   Sexual activity: Yes    Partners: Male    Birth control/protection: Post-menopausal  Other Topics Concern   Not on file  Social History Narrative   Married, one daughter, 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   Social Determinants of Health   Financial Resource Strain: Low Risk  (03/22/2021)   Overall Financial Resource Strain (CARDIA)    Difficulty of Paying Living Expenses: Not very hard  Food Insecurity: No Food Insecurity (03/22/2021)   Hunger Vital Sign    Worried About Running Out of Food in the Last Year: Never true    [place] in the Last Year: Never true  Transportation Needs: No Transportation Needs (03/22/2021)   PRAPARE - Hydrologist (Medical): No    Lack of Transportation (Non-Medical): No  Physical Activity: Inactive (08/13/2018)   Exercise Vital Sign    Days of Exercise per Week: 0 days    Minutes of Exercise per Session: 0 min  Stress: No Stress Concern Present (08/13/2018)   [place]    Feeling of Stress : Only a little  Social Connections: Moderately Integrated (07/02/2019)   Social Connection and Isolation Panel [NHANES]    Frequency of Communication with Friends and Family: More than three times a week    Frequency of Social Gatherings with Friends and Family: Once a week    Attends Religious Services: More than 4 times per year    Active Member of Clubs or Organizations: No  Attends Archivist Meetings: Never    Marital Status: Married  Human resources officer Violence: Not on file      Family History  Problem Relation Age of Onset   Heart disease Mother 76        chf   Diabetes Mother    Thyroid disease Mother    Cancer Mother 71       female/ skin   Hypertension Mother    Liver disease Mother    Hypertension Father    Alcohol abuse Father    Heart disease Father    Breast cancer Sister 65   Diabetes Sister    Hypothyroidism Sister    Hashimoto's thyroiditis Sister    Hyperlipidemia Brother    Hypertension Brother        x 2   Diabetes Brother    Hypothyroidism Brother    Prostate cancer Brother 25   Liver disease Maternal Grandmother    Diabetes Paternal Grandmother    Breast cancer Maternal Aunt    Hyperlipidemia Maternal Aunt    Heart disease Maternal Aunt    Bone cancer Maternal Aunt        metastatic   Breast cancer Niece 75   Colon cancer Neg Hx    Colon polyps Neg Hx    Esophageal cancer Neg Hx    Rectal cancer Neg Hx    Stomach cancer Neg Hx     Vitals:   06/29/21 1009  BP: 102/70  Pulse: 72  SpO2: 97%  Weight: 77.8 kg (171 lb 9.6 oz)    PHYSICAL EXAM: General:  Well appearing. No resp difficulty HEENT: normal Neck: supple. no JVD. Carotids 2+ bilat; no bruits. No lymphadenopathy or thryomegaly appreciated. Cor: PMI nondisplaced. Regular rate & rhythm. No rubs, gallops or murmurs. Lungs: clear Abdomen: soft, nontender, nondistended. No hepatosplenomegaly. No bruits or masses. Good bowel sounds. Extremities: no cyanosis, clubbing, rash, edema Neuro: alert & orientedx3, cranial nerves grossly intact. moves all 4 extremities w/o difficulty. Affect pleasant   ASSESSMENT & PLAN:  1. Chronic Systolic HF  - Echo 0/62 EF 55-60%. RV normal - Echo 6/21 EF 20-25%  moderately reduced RV function with severe MR/TR - Cath 06/25/19 no CAD. - Drop in EF felt most likely from R-CHOP - Echo 05/04/20 w/ EF 50-55% G2DD. RV normal - Echo 9/22 at [facility] EF back down to 40% w/ ? LV thrombus - cMRI 11/22 LVEF 39%, no LV clot - Echo 1/23 [facility]): EF 40-45% - Echo 03/08/21: EF 20-25%, RV mildly reduced, RVSP 44 mmHg - Direct admit  from clinic 03/07/21 with NYHA IIIb/IV symptoms, ReDs 51%. Had been off HF meds d/t low BP after recent CAR-T therapy and cytokine storm. Repeat echo, EF <20%.  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 Required milrinone for low-output.  - Echo today 06/29/21 EF improved 35-40% (read as 30-35%) with new severe MR - Stable NYHA II Volume stable on torsemide 20 qoD - Continue farxiga 10 mg daily - No ARNi/ARB w/ soft BPs and recent AKI  - Off Digoxin due to elevated level  - Continue spiro 25 mg daily.  - Continue ivabradine 7.5 mg twice a day.  - SBP 100-115 cut midodrine to 2.5 tid. If SBP < 100 go back to 5 tid  - Recent labs at [facility] reviewed and looked ok   2. H/o VT + PVCs - Denies palpitations. RRR on exam   - on Amio 200 mg daily.   3. Severe MR  - this is  new.  - will plan TEE next week    4. Stage IV non-Hodgkin's lymphoma, mantle cell lymphoma   - s/p R-CHOP w/ Neulasta. - Evidence of disease progression on f/u Chest CT 6/22. - s/p CAR-T therapy. - Followed at [facility]  5. Partial Lt Brachial DVT - Diagnosed 03/13/21 - on Eliquis. Denies abnormal bleeding    6. Recent AKI  - during recent admit, Scr 1.24>>1.64 (prior baseline ~1.0)  - Resolved. SCr now 1.19   Glori Bickers, MD 06/29/21

## 2021-07-04 ENCOUNTER — Ambulatory Visit (HOSPITAL_COMMUNITY): Payer: Medicare Other | Admitting: Anesthesiology

## 2021-07-04 ENCOUNTER — Other Ambulatory Visit (HOSPITAL_COMMUNITY): Payer: Self-pay | Admitting: *Deleted

## 2021-07-04 ENCOUNTER — Encounter (HOSPITAL_COMMUNITY)
Admission: RE | Disposition: A | Discharge status: Home / Self Care | Payer: Self-pay | Source: Home / Self Care | Attending: Internal Medicine

## 2021-07-04 ENCOUNTER — Ambulatory Visit (HOSPITAL_COMMUNITY)
Admission: RE | Admit: 2021-07-04 | Discharge: 2021-07-04 | Disposition: A | Discharge status: Home / Self Care | Payer: Medicare Other | Attending: Internal Medicine | Admitting: Internal Medicine

## 2021-07-04 ENCOUNTER — Encounter (HOSPITAL_COMMUNITY): Payer: Self-pay | Admitting: Internal Medicine

## 2021-07-04 ENCOUNTER — Ambulatory Visit (HOSPITAL_BASED_OUTPATIENT_CLINIC_OR_DEPARTMENT_OTHER): Payer: Medicare Other | Admitting: Anesthesiology

## 2021-07-04 ENCOUNTER — Other Ambulatory Visit: Payer: Self-pay

## 2021-07-04 ENCOUNTER — Ambulatory Visit (HOSPITAL_BASED_OUTPATIENT_CLINIC_OR_DEPARTMENT_OTHER): Payer: Medicare Other

## 2021-07-04 DIAGNOSIS — I340 Nonrheumatic mitral (valve) insufficiency: Secondary | ICD-10-CM | POA: Diagnosis not present

## 2021-07-04 DIAGNOSIS — Z7901 Long term (current) use of anticoagulants: Secondary | ICD-10-CM | POA: Insufficient documentation

## 2021-07-04 DIAGNOSIS — I509 Heart failure, unspecified: Secondary | ICD-10-CM | POA: Diagnosis not present

## 2021-07-04 DIAGNOSIS — C8310 Mantle cell lymphoma, unspecified site: Secondary | ICD-10-CM | POA: Insufficient documentation

## 2021-07-04 DIAGNOSIS — I82622 Acute embolism and thrombosis of deep veins of left upper extremity: Secondary | ICD-10-CM | POA: Diagnosis not present

## 2021-07-04 DIAGNOSIS — Z87891 Personal history of nicotine dependence: Secondary | ICD-10-CM | POA: Diagnosis not present

## 2021-07-04 DIAGNOSIS — I110 Hypertensive heart disease with heart failure: Secondary | ICD-10-CM | POA: Insufficient documentation

## 2021-07-04 DIAGNOSIS — C8590 Non-Hodgkin lymphoma, unspecified, unspecified site: Secondary | ICD-10-CM | POA: Insufficient documentation

## 2021-07-04 DIAGNOSIS — Z79899 Other long term (current) drug therapy: Secondary | ICD-10-CM | POA: Insufficient documentation

## 2021-07-04 DIAGNOSIS — I5022 Chronic systolic (congestive) heart failure: Secondary | ICD-10-CM | POA: Diagnosis not present

## 2021-07-04 DIAGNOSIS — I081 Rheumatic disorders of both mitral and tricuspid valves: Secondary | ICD-10-CM | POA: Diagnosis present

## 2021-07-04 DIAGNOSIS — E039 Hypothyroidism, unspecified: Secondary | ICD-10-CM

## 2021-07-04 DIAGNOSIS — I700 Atherosclerosis of aorta: Secondary | ICD-10-CM | POA: Insufficient documentation

## 2021-07-04 DIAGNOSIS — I493 Ventricular premature depolarization: Secondary | ICD-10-CM | POA: Diagnosis not present

## 2021-07-04 HISTORY — PX: TEE WITHOUT CARDIOVERSION: SHX5443

## 2021-07-04 LAB — ECHO TEE
MV M vel: 4.84 m/s
MV Peak grad: 93.7 mmHg
Radius: 0.6 cm

## 2021-07-04 SURGERY — ECHOCARDIOGRAM, TRANSESOPHAGEAL
Anesthesia: Monitor Anesthesia Care

## 2021-07-04 MED ORDER — PHENYLEPHRINE 80 MCG/ML (10ML) SYRINGE FOR IV PUSH (FOR BLOOD PRESSURE SUPPORT)
PREFILLED_SYRINGE | INTRAVENOUS | Status: DC | PRN
  Administered 2021-07-04: 80 ug via INTRAVENOUS

## 2021-07-04 MED ORDER — LIDOCAINE 2% (20 MG/ML) 5 ML SYRINGE
INTRAMUSCULAR | Status: DC | PRN
  Administered 2021-07-04: 100 mg via INTRAVENOUS

## 2021-07-04 MED ORDER — PROPOFOL 500 MG/50ML IV EMUL
INTRAVENOUS | Status: DC | PRN
  Administered 2021-07-04: 125 ug/kg/min via INTRAVENOUS

## 2021-07-04 MED ORDER — LACTATED RINGERS IV SOLN: INTRAVENOUS | Status: DC | PRN

## 2021-07-04 MED ORDER — PHENYLEPHRINE HCL-NACL 20-0.9 MG/250ML-% IV SOLN
INTRAVENOUS | Status: DC | PRN
  Administered 2021-07-04: 25 ug/min via INTRAVENOUS

## 2021-07-04 MED ORDER — PROPOFOL 10 MG/ML IV BOLUS
INTRAVENOUS | Status: DC | PRN
  Administered 2021-07-04 (×3): 20 mg via INTRAVENOUS

## 2021-07-04 NOTE — Anesthesia Postprocedure Evaluation (Signed)
Anesthesia Post Note  Patient: [name]  Procedure(s) Performed: TRANSESOPHAGEAL ECHOCARDIOGRAM (TEE)     Patient location during evaluation: PACU Anesthesia Type: MAC Level of consciousness: awake and alert Pain management: pain level controlled Vital Signs Assessment: post-procedure vital signs reviewed and stable Respiratory status: spontaneous breathing, nonlabored ventilation and respiratory function stable Cardiovascular status: blood pressure returned to baseline and stable Postop Assessment: no apparent nausea or vomiting Anesthetic complications: no   No notable events documented.  Last Vitals:  Vitals:   07/04/21 0912 07/04/21 0920  BP: (!) 97/36 (!) 87/48  Pulse: 71 68  Resp: 15 18  Temp: (!) 36.3 C   SpO2:  92%    Last Pain:  Vitals:   07/04/21 0930  TempSrc:   PainSc: 0-No pain                 Lynda Rainwater

## 2021-07-04 NOTE — Discharge Instructions (Signed)

## 2021-07-04 NOTE — Anesthesia Procedure Notes (Addendum)
Procedure Name: MAC Date/Time: 07/04/2021 8:41 AM  Performed by: Rande Brunt, CRNAPre-anesthesia Checklist: Patient identified, Emergency Drugs available, Suction available and Patient being monitored Patient Re-evaluated:Patient Re-evaluated prior to induction Oxygen Delivery Method: Nasal cannula Preoxygenation: Pre-oxygenation with 100% oxygen Induction Type: IV induction Airway Equipment and Method: Bite block Placement Confirmation: positive ETCO2 and CO2 detector Dental Injury: Teeth and Oropharynx as per pre-operative assessment

## 2021-07-04 NOTE — Anesthesia Preprocedure Evaluation (Signed)
Anesthesia Evaluation  Patient identified by MRN, date of birth, ID band Patient awake    Reviewed: Allergy & Precautions, NPO status , Patient's Chart, lab work & pertinent test results, reviewed documented beta blocker date and time   Airway Mallampati: III  TM Distance: >3 FB Neck ROM: Full    Dental no notable dental hx. (+) Teeth Intact, Dental Advisory Given   Pulmonary former smoker,  Snores at night, never had sleep study, no witnessed apneas   Pulmonary exam normal breath sounds clear to auscultation       Cardiovascular hypertension (112/56 in preop), Pt. on medications and Pt. on home beta blockers +CHF  Normal cardiovascular exam+ dysrhythmias  Rhythm:Regular Rate:Normal  Echo 05/04/2020 1. Left ventricular ejection fraction, by estimation, is 50 to 55%. The  left ventricle has low normal function. The left ventricle has no regional  wall motion abnormalities. Left ventricular diastolic parameters are  consistent with Grade II diastolic  dysfunction (pseudonormalization).  2. Right ventricular systolic function is normal. The right ventricular  size is normal.  3. Left atrial size was mildly dilated.  4. The mitral valve is normal in structure. Trivial mitral valve  regurgitation. No evidence of mitral stenosis.  5. The aortic valve is normal in structure. Aortic valve regurgitation is  not visualized. No aortic stenosis is present.  6. The inferior vena cava is normal in size with greater than 50%  respiratory variability, suggesting right atrial pressure of 3 mmHg.    Neuro/Psych negative neurological ROS  negative psych ROS   GI/Hepatic Neg liver ROS, Chronic cholecystitis    Endo/Other  Hypothyroidism Obesity BMI 37  Renal/GU Lab Results      Component                Value               Date                      CREATININE               0.94                09/21/2020                BUN                       16                  09/21/2020                NA                       137                 09/21/2020                K                        4.6                 09/21/2020                CL                       102  09/21/2020                CO2                      27                  09/21/2020             negative genitourinary   Musculoskeletal negative musculoskeletal ROS (+)   Abdominal (+) + obese (BMI 36.5),   Peds  Hematology       hct 45.6, plt 189   Anesthesia Other Findings Stage IV non Hodgkins Lymphoma-   Reproductive/Obstetrics negative OB ROS                             Anesthesia Physical  Anesthesia Plan  ASA: 4  Anesthesia Plan: MAC   Post-op Pain Management: Minimal or no pain anticipated   Induction: Intravenous  PONV Risk Score and Plan: 2 and Ondansetron, Treatment may vary due to age or medical condition and Propofol infusion  Airway Management Planned: Nasal Cannula  Additional Equipment: None  Intra-op Plan:   Post-operative Plan:   Informed Consent: I have reviewed the patients History and Physical, chart, labs and discussed the procedure including the risks, benefits and alternatives for the proposed anesthesia with the patient or authorized representative who has indicated his/her understanding and acceptance.     Dental advisory given  Plan Discussed with: CRNA  Anesthesia Plan Comments:         Anesthesia Quick Evaluation

## 2021-07-04 NOTE — CV Procedure (Signed)
    TRANSESOPHAGEAL ECHOCARDIOGRAM   NAME:  Natalie Baker   MRN: [id number] DOB:  Oct 07, 1955   ADMIT DATE: 07/04/2021  INDICATIONS:  Mitral regurgitation  PROCEDURE:   Informed consent was obtained prior to the procedure. The risks, benefits and alternatives for the procedure were discussed and the patient comprehended these risks.  Risks include, but are not limited to, cough, sore throat, vomiting, nausea, somnolence, esophageal and stomach trauma or perforation, bleeding, low blood pressure, aspiration, pneumonia, infection, trauma to the teeth and death.    After a procedural time-out, the patient was sedated by the anesthesia service. Was she was appropriately sedated,  the transesophageal probe was inserted in the esophagus and stomach without difficulty and multiple views were obtained.    COMPLICATIONS:    There were no immediate complications.  FINDINGS:  LEFT VENTRICLE: EF = 20-25%. Global HK  RIGHT VENTRICLE: Mild HK  LEFT ATRIUM: Mild to moderately dilated  LEFT ATRIAL APPENDAGE: No thrombus.   RIGHT ATRIUM: Normal  AORTIC VALVE:  Trileaflet. Normal  MITRAL VALVE:  Small, restricted posterior leaflet. Severe central MR  TRICUSPID VALVE: Normal. Moderate TR  PULMONIC VALVE: Grossly normal.  INTERATRIAL SEPTUM: No PFO or ASD.  PERICARDIUM: No effusion  DESCENDING AORTA: Mild plaque    Veniamin Kincaid,MD 9:05 AM

## 2021-07-04 NOTE — Progress Notes (Signed)
  Echocardiogram Echocardiogram Transesophageal has been performed.  Natalie Baker 07/04/2021, 9:18 AM

## 2021-07-04 NOTE — Interval H&P Note (Signed)
History and Physical Interval Note:  07/04/2021 8:21 AM  Natalie Baker  has presented today for surgery, with the diagnosis of mitral regurgitation.  The various methods of treatment have been discussed with the patient and family. After consideration of risks, benefits and other options for treatment, the patient has consented to  Procedure(s): TRANSESOPHAGEAL ECHOCARDIOGRAM (TEE) (N/A) as a surgical intervention.  The patient's history has been reviewed, patient examined, no change in status, stable for surgery.  I have reviewed the patient's chart and labs.  Questions were answered to the patient's satisfaction.     Cerise Lieber

## 2021-07-04 NOTE — Transfer of Care (Signed)
Immediate Anesthesia Transfer of Care Note  Patient: Natalie Baker  Procedure(s) Performed: TRANSESOPHAGEAL ECHOCARDIOGRAM (TEE)  Patient Location: Endoscopy Unit  Anesthesia Type:MAC  Level of Consciousness: drowsy, patient cooperative and responds to stimulation  Airway & Oxygen Therapy: Patient Spontanous Breathing and Patient connected to nasal cannula oxygen  Post-op Assessment: Report given to RN, Post -op Vital signs reviewed and stable and Patient moving all extremities X 4  Post vital signs: Reviewed and stable  Last Vitals:  Vitals Value Taken Time  BP    Temp    Pulse 72 07/04/21 0911  Resp 15 07/04/21 0911  SpO2 91 % 07/04/21 0911  Vitals shown include unvalidated device data.  Last Pain:  Vitals:   07/04/21 0750  TempSrc: Temporal  PainSc: 0-No pain         Complications: No notable events documented.

## 2021-07-12 ENCOUNTER — Telehealth: Payer: Self-pay

## 2021-07-12 NOTE — Telephone Encounter (Signed)
Per [employer] review of 07/04/2021 TEE: "For patient RE: This is secondary MR and does not require a surgical consult  This valve looks suitable for a MitraClip. The fossa looks approachable for transseptal puncture in the Bicaval and SAXB views. TR is noted. LA dimensions are large enough for device steering and straddle. The MR jet is broad based and centrally located. The posterior leaflet measures 51m in the 115 LVOT grasping view. Gradient measures 2 mmHg (85BPM); MVA measures 4.5 cm2. Based on the short posterior leaflet, I'd recommend starting with an NTW and then assess for gradient."

## 2021-07-18 ENCOUNTER — Other Ambulatory Visit (HOSPITAL_COMMUNITY): Payer: Self-pay

## 2021-07-19 ENCOUNTER — Other Ambulatory Visit (HOSPITAL_COMMUNITY): Payer: Self-pay

## 2021-08-02 ENCOUNTER — Ambulatory Visit: Payer: Medicare Other | Admitting: Internal Medicine

## 2021-08-02 ENCOUNTER — Other Ambulatory Visit: Payer: Medicare Other

## 2021-08-26 ENCOUNTER — Encounter (HOSPITAL_COMMUNITY): Payer: Self-pay | Admitting: Internal Medicine

## 2021-08-26 ENCOUNTER — Encounter: Payer: Self-pay | Admitting: Internal Medicine

## 2021-08-27 ENCOUNTER — Other Ambulatory Visit (HOSPITAL_COMMUNITY): Payer: Self-pay | Admitting: Cardiology

## 2021-08-27 ENCOUNTER — Other Ambulatory Visit: Payer: Self-pay | Admitting: Nurse Practitioner

## 2021-08-27 DIAGNOSIS — E039 Hypothyroidism, unspecified: Secondary | ICD-10-CM

## 2021-08-28 ENCOUNTER — Other Ambulatory Visit (HOSPITAL_COMMUNITY): Payer: Self-pay | Admitting: Cardiology

## 2021-08-28 ENCOUNTER — Other Ambulatory Visit (HOSPITAL_COMMUNITY): Payer: Self-pay

## 2021-08-29 ENCOUNTER — Other Ambulatory Visit (HOSPITAL_COMMUNITY): Payer: Self-pay

## 2021-08-30 ENCOUNTER — Other Ambulatory Visit: Payer: Medicare Other

## 2021-08-30 ENCOUNTER — Ambulatory Visit: Payer: Medicare Other | Admitting: Internal Medicine

## 2021-09-04 ENCOUNTER — Inpatient Hospital Stay: Payer: Medicare Other | Attending: Internal Medicine | Admitting: Internal Medicine

## 2021-09-04 ENCOUNTER — Inpatient Hospital Stay: Payer: Medicare Other

## 2021-09-04 ENCOUNTER — Other Ambulatory Visit: Payer: Self-pay

## 2021-09-04 VITALS — BP 122/62 | HR 74 | Temp 97.7°F | Wt 171.5 lb

## 2021-09-04 DIAGNOSIS — C8318 Mantle cell lymphoma, lymph nodes of multiple sites: Secondary | ICD-10-CM | POA: Insufficient documentation

## 2021-09-04 DIAGNOSIS — I509 Heart failure, unspecified: Secondary | ICD-10-CM | POA: Diagnosis not present

## 2021-09-04 DIAGNOSIS — Z90710 Acquired absence of both cervix and uterus: Secondary | ICD-10-CM | POA: Insufficient documentation

## 2021-09-04 DIAGNOSIS — Z95828 Presence of other vascular implants and grafts: Secondary | ICD-10-CM

## 2021-09-04 DIAGNOSIS — D89839 Cytokine release syndrome, grade unspecified: Secondary | ICD-10-CM | POA: Insufficient documentation

## 2021-09-04 LAB — CMP (CANCER CENTER ONLY)
ALT: 19 U/L (ref 0–44)
AST: 21 U/L (ref 15–41)
Albumin: 4.4 g/dL (ref 3.5–5.0)
Alkaline Phosphatase: 99 U/L (ref 38–126)
Anion gap: 7 (ref 5–15)
BUN: 25 mg/dL — ABNORMAL HIGH (ref 8–23)
CO2: 31 mmol/L (ref 22–32)
Calcium: 10 mg/dL (ref 8.9–10.3)
Chloride: 99 mmol/L (ref 98–111)
Creatinine: 1.33 mg/dL — ABNORMAL HIGH (ref 0.44–1.00)
GFR, Estimated: 44 mL/min — ABNORMAL LOW (ref >60)
Glucose, Bld: 86 mg/dL (ref 70–99)
Potassium: 3.6 mmol/L (ref 3.5–5.1)
Sodium: 137 mmol/L (ref 135–145)
Total Bilirubin: 0.4 mg/dL (ref 0.3–1.2)
Total Protein: 6.9 g/dL (ref 6.5–8.1)

## 2021-09-04 LAB — CBC WITH DIFFERENTIAL (CANCER CENTER ONLY)
Abs Immature Granulocytes: 0.3 10*3/uL — ABNORMAL HIGH (ref 0.00–0.07)
Basophils Absolute: 0 10*3/uL (ref 0.0–0.1)
Basophils Relative: 1 %
Eosinophils Absolute: 0.1 10*3/uL (ref 0.0–0.5)
Eosinophils Relative: 3 %
HCT: 40.6 % (ref 36.0–46.0)
Hemoglobin: 13.9 g/dL (ref 12.0–15.0)
Immature Granulocytes: 11 %
Lymphocytes Relative: 5 %
Lymphs Abs: 0.1 10*3/uL — ABNORMAL LOW (ref 0.7–4.0)
MCH: 30.7 pg (ref 26.0–34.0)
MCHC: 34.2 g/dL (ref 30.0–36.0)
MCV: 89.6 fL (ref 80.0–100.0)
Monocytes Absolute: 0.4 10*3/uL (ref 0.1–1.0)
Monocytes Relative: 14 %
Neutro Abs: 1.9 10*3/uL (ref 1.7–7.7)
Neutrophils Relative %: 66 %
Platelet Count: 280 10*3/uL (ref 150–400)
RBC: 4.53 MIL/uL (ref 3.87–5.11)
RDW: 16 % — ABNORMAL HIGH (ref 11.5–15.5)
Smear Review: NORMAL
WBC Count: 2.8 10*3/uL — ABNORMAL LOW (ref 4.0–10.5)
nRBC: 0 % (ref 0.0–0.2)

## 2021-09-04 LAB — LACTATE DEHYDROGENASE: LDH: 219 U/L — ABNORMAL HIGH (ref 98–192)

## 2021-09-04 MED ORDER — HEPARIN SOD (PORK) LOCK FLUSH 100 UNIT/ML IV SOLN
500.0000 [IU] | Freq: Once | INTRAVENOUS | Status: AC | PRN
  Administered 2021-09-04: 500 [IU]

## 2021-09-04 MED ORDER — SODIUM CHLORIDE 0.9% FLUSH
10.0000 mL | INTRAVENOUS | Status: DC | PRN
  Administered 2021-09-04: 10 mL

## 2021-09-04 MED ORDER — TEMAZEPAM 15 MG PO CAPS: 15.0000 mg | ORAL_CAPSULE | Freq: Every evening | ORAL | 0 refills | Status: DC | PRN

## 2021-09-04 NOTE — Progress Notes (Signed)
[place] [phone]   [phone]  OFFICE PROGRESS NOTE  [name], MD [address] [place] [address]  DIAGNOSIS: Stage IV non-Hodgkin's lymphoma, mantle cell lymphoma.  She presented with left iliac and retroperitoneal lymphadenopathy.  She also presented with left axillary lymphadenopathy and bone marrow involvement.  She was diagnosed in December 2020.   PRIOR THERAPY:  1) Systemic chemotherapy with R-CHOP with Neulasta support.  First dose on 01/27/2019. Status post 6 cycles.   2) Acalabrutinib 100 mg p.o. twice daily.  First dose started June 17, 2020.  Status post 3 months of treatment. 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 female returns to the clinic today for follow-up visit.  The patient is feeling fine today with no concerning complaints.  She denied having any chest pain, shortness of breath, cough or hemoptysis.  She denied having any fever or chills.  She has no nausea, vomiting, diarrhea or constipation.  She has no headache or visual changes.  She has no recent weight loss or night sweats.  She has been in observation and feeling fine.  She is here today for evaluation and repeat blood work.   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  MEDICATIONS:  Current Outpatient Medications  Medication Sig Dispense Refill   amiodarone (PACERONE) 200 MG tablet TAKE 1 TABLET BY MOUTH EVERY DAY 90 tablet 3   apixaban (ELIQUIS) 5 MG TABS tablet Take 5 mg by mouth 2 (two) times daily.     cholecalciferol (VITAMIN D3) 25 MCG (1000 UNIT) tablet Take 1,000 Units by mouth once a week.     CORLANOR 5 MG TABS tablet TAKE 1 TABLET (5 MG TOTAL) BY MOUTH 2 (TWO) TIMES DAILY WITH A MEAL. 180 tablet 3   estradiol (VIVELLE-DOT) 0.05 MG/24HR patch Place 1 patch onto the skin 2 (two) times a week. Tuesdays & Fridays     FARXIGA 10 MG TABS tablet TAKE 1 TABLET BY MOUTH EVERY DAY 30 tablet 11   ivabradine (CORLANOR) 7.5 MG TABS tablet Take 1 tablet by mouth 2 times daily with a meal. 60 tablet 3   levothyroxine (SYNTHROID) 100 MCG tablet TAKE 1 TABLET BY MOUTH DAILY ON AN EMPTY STOMACH WITH ONLY WATER FOR 30 MINUTES & NO ANTACID/CALCIUM/MAGNESIUM FOR 4 HOURS & AVOID BIOTIN 90 tablet 1   midodrine (PROAMATINE) 5 MG tablet Take 2.5 mg by mouth in the morning, at noon, and at bedtime. Hold for systolic bp < 11638    ondansetron (ZOFRAN) 8 MG tablet Take 8 mg by mouth every 8 (eight) hours as needed for vomiting or nausea.  ondansetron (ZOFRAN-ODT) 4 MG disintegrating tablet Take 1 tablet (4 mg total) by mouth every 8 (eight) hours as needed for nausea or vomiting. 30 tablet 3   potassium chloride SA (KLOR-CON M) 20 MEQ tablet Take 20 mEq by mouth See admin instructions. Take 1 tablet (20 meq) by mouth every other day (scheduled); may take additional dose daily if needed for fluid retention.     prochlorperazine (COMPAZINE) 10 MG tablet TAKE 1 TABLET BY MOUTH EVERY 6 HOURS AS NEEDED FOR NAUSEA OR VOMITING. 30 tablet 0   spironolactone (ALDACTONE) 25 MG tablet TAKE 1 TABLET BY MOUTH EVERY DAY 90 tablet 3   sulfamethoxazole-trimethoprim (BACTRIM DS)  800-160 MG tablet Take 1 tablet by mouth See admin instructions. Take 1 tablet twice daily on Saturdays and _0 91 for emdometriosis   RIGHT/LEFT HEART CATH AND CORONARY ANGIOGRAPHY N/A 06/25/2019   Procedure: RIGHT/LEFT HEART CATH AND CORONARY ANGIOGRAPHY;  Surgeon: Jolaine Artist, MD;  Location: [facility];  Service: Cardiovascular;  Laterality: N/A;   TEE WITHOUT CARDIOVERSION N/A 07/04/2021   Procedure: TRANSESOPHAGEAL ECHOCARDIOGRAM (TEE);  Surgeon: Jolaine Artist, MD;  Location[facility] ENDOSCOPY;  Service: Cardiovascular;  Laterality: N/A;   UPPER GASTROINTESTINAL ENDOSCOPY      REVIEW OF SYSTEMS:  A comprehensive review of systems was negative.   PHYSICAL EXAMINATION:  General appearance: alert, cooperative, and no distress Head: Normocephalic, without obvious abnormality, atraumatic Neck: no adenopathy, no JVD, supple, symmetrical, trachea midline, and thyroid not enlarged, symmetric, no tenderness/mass/nodules Lymph nodes: Cervical, supraclavicular, and axillary nodes normal. Resp: clear to auscultation bilaterally Back: symmetric, no curvature. ROM normal. No CVA tenderness. Cardio: regular rate and rhythm, S1, S2 normal, no murmur, click, rub or gallop GI: soft, non-tender; bowel sounds normal; no masses,  no organomegaly Extremities: extremities normal, atraumatic, no cyanosis or edema  ECOG PERFORMANCE STATUS: 1 - Symptomatic but completely ambulatory  Blood pressure 122/62, pulse 74, temperature 97.7 F (36.5 C), temperature source Tympanic, weight 171 lb 8 oz (77.8 kg), SpO2 98 %.  LABORATORY DATA: Lab Results  Component Value Date   WBC 2.8 (L) 06/29/2021   HGB 13.4 06/29/2021   HCT 41.5 06/29/2021   MCV  93.7 06/29/2021   PLT 290 06/29/2021      Chemistry      Component Value Date/Time   NA 138 06/29/2021 1122   K 4.3 06/29/2021 1122   CL 103 06/29/2021 1122   CO2 27 06/29/2021 1122   BUN 17 06/29/2021 1122   CREATININE 1.15 (H) 06/29/2021 1122   CREATININE 1.19 (H) 05/31/2021 1458   CREATININE 1.03 12/12/2020 0957      Component Value Date/Time   CALCIUM 9.5 06/29/2021 1122   ALKPHOS 113 05/31/2021 1458   AST 22 05/31/2021 1458   ALT 19 05/31/2021 1458   BILITOT 0.5 05/31/2021 1458       RADIOGRAPHIC STUDIES: No results found.   ASSESSMENT AND PLAN: This is a very pleasant 66 years old white female recently diagnosed with stage IV less aggressive mantle cell lymphoma presented with left iliac and retroperitoneal lymphadenopathy as well as left axillary and bone marrow involvement in December 2020. The patient completed 6 cycles of systemic chemotherapy with R-CHOP.  She tolerated her treatment fairly well with no  concerning adverse effects but few months later she developed congestive heart failure with drop in her ejection fraction to the 20-25%.  This has improved over the last few months and currently around 55%.  She is followed by Dr. Haroldine Laws. Unfortunately repeat CT scan in June 2022 showed evidence for disease progression with mild increase in the small bilateral axillary lymph nodes and moderate increase in the left supraclavicular as well as ileocolic mesenteric lymph nodes and left external iliac lymph nodes but mild increase in the periaortic retroperitoneal lymph nodes. The patient is started treatment with acalabrutinib 100 mg p.o. twice daily on June 17, 2020  She was seen by Dr. Laverle Hobby at [facility] on July 12, 2020 for discussion of potential autologous stem cell transplant.   The patient had repeat CT scan of the chest, abdomen pelvis performed recently that showed decrease in the size of mesenteric lymph node/mass of the right hemiabdomen, left external iliac lymph nodes and lymph nodes adjacent to the pancreatic tail/splenic hilum but there was mild increase in the size of the mediastinal paraesophageal nodes and retroperitoneal periaortic lymph node as well as increase in the size of the spleen. 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 The patient has been on observation since that time and she is feeling fine with no concerning complaints. Repeat CBC today showed the persistent mild leukocytopenia.  Comprehensive metabolic panel and LDH are still pending. I recommended for the patient to continue on observation with repeat blood work in 2 months. She will reach out to the hematologist at [facility] regarding any further recommendation for monitoring of her condition including imaging  studies. The patient was advised to call immediately if she has any other concerning symptoms in the interval. For the lack of sleep, she is currently on Restoril 15 mg p.o. nightly.  The patient voices understanding of current disease status and treatment options and is in agreement with the current care plan.  All questions were answered. The patient knows to call the clinic with any problems, questions or concerns. We can certainly see the patient much sooner if necessary.  Disclaimer: This note was dictated with voice recognition software. Similar sounding words can inadvertently be transcribed and may not be corrected upon  review.

## 2021-09-04 NOTE — Progress Notes (Unsigned)
MEDICARE ANNUAL WELLNESS VISIT AND FOLLOW UP  Assessment:    Atherosclerosis of aorta (HCC) Control blood pressure, cholesterol, glucose, increase exercise.    Systolic CHF ([place]) appears euvolemic,  Dr. Missy Sabins following closely Emphasized salt restriction, less than 2047m a day. Encouraged daily monitoring of the patient's weight, call office if 5 lb weight loss or gain in a day.  Encouraged regular exercise. If any increasing shortness of breath, swelling, or chest pressure go to ER immediately.  decrease your fluid intake to less than 2 L daily please remember to always increase your potassium intake with any increase of your fluid pill.    Other specified hypothyroidism Hypothyroidism-check TSH level, continue medications the same, reminded to take on an empty stomach 30-670ms before food.  - TSH   Palpitations -currently well controlled, continue to avoid triggers - EKG 12-Lead   Hyperlipidemia -continue supplements, check lipids, decrease fatty foods, increase activity.  - if LDL remains above 130 initiate statin discussed - will also coordinate with onc Dr. MoJulien Nordmanno make sure they are on board.  - Lipid panel   Vitamin D deficiency - defer check as never changed dose, she will increase to 10000 IU daily, check next OV   Osteoporosis -Vit D and calcium recommended, vivielle dot - last DEXA 2018-2019, getting DEXA via GYN   Morbid Obesity - BMI 36 with htn, hld, prediabetes, hld -diet and exercise discussed; add regular exercise in small increments - weight loss encouraged    Medication management - CBC with Differential/Platelet - CMP/GFR - Magnesium   Prediabetes/family history diabetes - Defer Hemoglobin A1c as just had checked  - weight loss discussed, goal for <190 lb set for next visit    Hypertension - Urinalysis, [facility] reflex microscopic [facility]- Microalbumin / creatinine urine ratio - EKG 12-Lead Mantle Cell Lymphoma Saw Dr.  MoJulien Nordmann/29/23 Stage IV non-Hodgkin's lymphoma, mantle cell lymphoma.    6 cycles of systemic chemotherapy with R-CHOP.  She tolerated her treatment fairly well with no concerning adverse effects but few months later she developed congestive heart failure with drop in her ejection fraction to the 20-25%.    Unfortunately repeat CT scan in June 2022 showed evidence for disease progression with mild increase in the small bilateral axillary lymph nodes and moderate increase in the left supraclavicular as well as ileocolic mesenteric lymph nodes and left external iliac lymph nodes but mild increase in the periaortic retroperitoneal lymph nodes. The patient is started treatment with acalabrutinib 100 mg p.o. twice daily on June 17, 2020   [facility] for discussion of potential autologous stem cell transplant.   The patient had repeat CT scan of the chest, abdomen pelvis performed recently that showed decrease in the size of mesenteric lymph node/mass of the right hemiabdomen, left external iliac lymph nodes and lymph nodes adjacent to the pancreatic tail/splenic hilum but there was mild increase in the size of the mediastinal paraesophageal nodes and retroperitoneal periaortic lymph node as well as increase in the size of the spleen.  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 The patient has been on observation since that time and she is feeling fine with no concerning complaints.  Repeat CBC today showed the persistent mild leukocytopenia.  Comprehensive  metabolic panel and LDH are still pending. I recommended for the patient to continue on observation with repeat blood work in 2 months. She will reach out to the hematologist at [facility] regarding any  further recommendation for monitoring of her condition including imaging studies.  PRIOR THERAPY:  1) Systemic chemotherapy with R-CHOP with Neulasta support.  First dose on 01/27/2019. Status post 6 cycles.   2) Acalabrutinib 100 mg p.o. twice daily.  First dose started June 17, 2020.  Status post 3 months of treatment. 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 Haroldine Laws TEE completed 07/04/21 ER   Over 40 minutes of exam, counseling, chart review and critical decision making was performed Future Appointments  Date Time Provider [place]  09/05/2021 10:00 AM Darrol Jump, NP [facility] None  10/03/2021  9:40 AM Bensimhon, Shaune Pascal, MD [facility] None  10/30/2021  3:15 PM [facility] None  10/30/2021  3:45 PM Curt Bears, MD [facility] None  12/12/2021  9:00 AM Darrol Jump, NP [facility] None  06/14/2022 10:00 AM Darrol Jump, NP [facility] None     Plan:   During the course of the visit the patient was educated and counseled about appropriate screening and preventive services including:   Pneumococcal vaccine  Prevnar 13 Influenza vaccine Td vaccine Screening electrocardiogram Bone densitometry screening Colorectal cancer screening Diabetes screening Glaucoma screening Nutrition counseling  Advanced directives: requested   Subjective:  Natalie Baker is a 66 y.o. female who presents for Medicare Annual Wellness Visit and *** month follow up. She has Hyperlipidemia; Vitamin D deficiency; Hypothyroidism; Morbid obesity ([place]) BMI 35+ with prediabetes, hyperlipidemia, htn; Osteoporosis; Prediabetes; Labile hypertension; Aortic atherosclerosis ([place]) -  per CT 12/22/2018; Mantle cell lymphoma ([place]); Goals of care, counseling/discussion; Encounter for antineoplastic chemotherapy; Port-A-Cath in place; Sinus tachycardia; Systolic CHF ([place]); H84 deficiency; Deep vein thrombosis (DVT) of brachial vein of left upper extremity ([place]); and PVC's (premature ventricular contractions) on their problem list.   BMI is There is no height or weight on file to calculate BMI., she {HAS HAS ONG:29528} been working on diet and exercise. Wt Readings from Last 3 Encounters:  09/04/21 171 lb 8 oz (77.8 kg)  07/04/21 168 lb (76.2 kg)  06/29/21 171 lb 9.6 oz (77.8 kg)    Her blood pressure {HAS HAS NOT:18834} been controlled at home, today their BP is   She {DOES_DOES UXL:24401} workout. She denies chest pain, shortness of breath, dizziness.  She {ACTION; IS/IS UUV:25366440} on cholesterol medication and denies myalgias. Her cholesterol {ACTION; IS/IS NOT:21021397} at goal. The cholesterol last visit was:   Lab Results  Component Value Date   CHOL 202 (H) 12/12/2020   HDL 32 (L) 12/12/2020   LDLCALC 144 (H) 12/12/2020   TRIG 133 12/12/2020   CHOLHDL 6.3 (H) 12/12/2020   She {Has/has not:18111} been working on diet and exercise for ***prediabetes, and denies {Symptoms; diabetes w/o none:19199}. Last A1C in the office was:  Lab Results  Component Value Date   HGBA1C 5.3 09/21/2020   Last GFR: Lab Results  Component Value Date   EGFR 60 12/12/2020   Patient is on Vitamin D supplement.   Lab Results  Component Value Date   VD25OH 36 12/12/2020  Medication Review: Current Outpatient Medications on File Prior to Visit  Medication Sig Dispense Refill   amiodarone (PACERONE) 200 MG tablet TAKE 1 TABLET BY MOUTH EVERY DAY (Patient taking differently: Take 200 mg by mouth daily. 1/2 tablet 2 times daily) 90 tablet 3   apixaban (ELIQUIS) 5 MG TABS tablet Take 5 mg by mouth 2 (two) times daily.     cholecalciferol (VITAMIN D3) 25 MCG (1000 UNIT) tablet  Take 1,000 Units by mouth once a week.     estradiol (VIVELLE-DOT) 0.05 MG/24HR patch Place 1 patch onto the skin 2 (two) times a week. Tuesdays & Fridays     FARXIGA 10 MG TABS tablet TAKE 1 TABLET BY MOUTH EVERY DAY 30 tablet 11   ivabradine (CORLANOR) 7.5 MG TABS tablet Take 1 tablet by mouth 2 times daily with a meal. 60 tablet 3   levothyroxine (SYNTHROID) 100 MCG tablet TAKE 1 TABLET BY MOUTH DAILY ON AN EMPTY STOMACH WITH ONLY WATER FOR 30 MINUTES & NO ANTACID/CALCIUM/MAGNESIUM FOR 4 HOURS & AVOID BIOTIN 90 tablet 1   midodrine (PROAMATINE) 5 MG tablet Take 2.5 mg by mouth in the morning, at noon, and at bedtime. Hold for systolic bp < 536.     ondansetron (ZOFRAN) 8 MG tablet Take 8 mg by mouth every 8 (eight) hours as needed for vomiting or nausea.     ondansetron (ZOFRAN-ODT) 4 MG disintegrating tablet Take 1 tablet (4 mg total) by mouth every 8 (eight) hours as needed for nausea or vomiting. 30 tablet 3   potassium chloride SA (KLOR-CON M) 20 MEQ tablet Take 20 mEq by mouth See admin instructions. Take 1 tablet (20 meq) by mouth every other day (scheduled); may take additional dose daily if needed for fluid retention.     prochlorperazine (COMPAZINE) 10 MG tablet TAKE 1 TABLET BY MOUTH EVERY 6 HOURS AS NEEDED FOR NAUSEA OR VOMITING. 30 tablet 0   spironolactone (ALDACTONE) 25 MG tablet TAKE 1 TABLET BY MOUTH EVERY DAY (Patient taking differently: 1/2 tablet daily) 90 tablet 3   sulfamethoxazole-trimethoprim (BACTRIM DS) 800-160 MG tablet Take 1 tablet by mouth See admin instructions. Take 1 tablet twice daily on Saturdays and Sundays ONLY     temazepam (RESTORIL) 15 MG capsule Take 1 capsule (15 mg total) by mouth at bedtime as needed for sleep. 30 capsule 0   torsemide (DEMADEX) 20 MG tablet Take 1 tablet by mouth every other day. (Patient taking differently: Take 20 mg by mouth See admin instructions. Take 1 tablet (20 mg) by mouth every other day(scheduled); may take additional dose daily  if needed for fluid retention.) 30 tablet 6   valACYclovir (VALTREX) 500 MG tablet Take 500 mg by mouth 2 (two) times daily.     No current facility-administered medications on file prior to visit.    Allergies  Allergen Reactions   Adriamycin [Doxorubicin] Other (See Comments)    Acute heart failure (chemo from Jan - May 2021)   Ppd [Tuberculin Purified Protein Derivative] Swelling    Positive reaction.  Negative Chest Xray 06/16/12    Current Problems (verified) Patient Active Problem List   Diagnosis Date Noted   PVC's (premature ventricular contractions) 06/01/2021   Deep vein thrombosis (DVT) of brachial vein of left upper extremity ([place]) 05/31/2021   B12 deficiency 64/40/3474   Systolic CHF ([place])    Sinus tachycardia 06/21/2019   Port-A-Cath in place 02/03/2019   Encounter for antineoplastic chemotherapy 01/20/2019   Mantle cell lymphoma ([place]) 01/12/2019  Goals of care, counseling/discussion 01/12/2019   Aortic atherosclerosis ([place]) - per CT 12/22/2018 12/22/2018   Prediabetes 07/26/2015   Labile hypertension 07/26/2015   Morbid obesity ([place]) BMI 35+ with prediabetes, hyperlipidemia, htn    Osteoporosis    Hypothyroidism 12/25/2012   Hyperlipidemia    Vitamin D deficiency     Screening Tests Immunization History  Administered Date(s) Administered   Influenza Inj Mdck Quad Pf 10/04/2017   Influenza Split 11/06/2010   Influenza, High Dose Seasonal PF 12/12/2020   Influenza, Seasonal, Injecte, Preservative Fre 01/29/2016   Influenza,inj,Quad PF,6+ Mos 01/27/2019   PNEUMOCOCCAL CONJUGATE-20 12/12/2020   Tdap 07/29/2016   Health Maintenance  Topic Date Due   COVID-19 Vaccine (1) Never done   Zoster Vaccines- Shingrix (1 of 2) Never done   INFLUENZA VACCINE  11/06/2021 (Originally 08/07/2021)   MAMMOGRAM  12/28/2022   TETANUS/TDAP  07/30/2026   COLONOSCOPY (Pts 45-30yrs Insurance coverage will need to be confirmed)  03/19/2028   Pneumonia Vaccine 52+ Years old   Completed   DEXA SCAN  Completed   Hepatitis C Screening  Completed   HIV Screening  Completed   HPV VACCINES  Aged Out    Last colonoscopy: 03/2018 Dr. Carlean Purl 10 Year recall Due 2030 Mammograms: 12/2020 Due 12/2021 Last pap smear/pelvic exam: ***   DEXA: 2014  Names of Other Physician/Practitioners you currently use: 1. [facility] here for primary care 2. ***, eye doctor, last visit *** 3. ***, dentist, last visit ***  Patient Care Team: [name], MD as PCP - General (Internal Medicine) Elouise Munroe, MD as PCP - Cardiology (Cardiology) Gatha Mayer, MD as Consulting Physician (Gastroenterology) Paula Compton, MD as Consulting Physician (Obstetrics and Gynecology) Druscilla Brownie, MD as Consulting Physician (Dermatology)  SURGICAL HISTORY She  has a past surgical history that includes Abdominal hysterectomy (1997); Cesarean section (1983); Colonoscopy w/ biopsies (12/29/2006); laparoscopy; Upper gastrointestinal endoscopy; IR IMAGING GUIDED PORT INSERTION (01/15/2019); RIGHT/LEFT HEART CATH AND CORONARY ANGIOGRAPHY (N/A, 06/25/2019); Cholecystectomy (N/A, 09/25/2020); and TEE without cardioversion (N/A, 07/04/2021). FAMILY HISTORY Her family history includes Alcohol abuse in her father; Bone cancer in her maternal aunt; Breast cancer in her maternal aunt; Breast cancer (age of onset: 52) in her niece; Breast cancer (age of onset: 71) in her sister; Cancer (age of onset: 56) in her mother; Diabetes in her brother, mother, paternal grandmother, and sister; Hashimoto's thyroiditis in her sister; Heart disease in her father and maternal aunt; Heart disease (age of onset: 45) in her mother; Hyperlipidemia in her brother and maternal aunt; Hypertension in her brother, father, and mother; Hypothyroidism in her brother and sister; Liver disease in her maternal grandmother and mother; Prostate cancer (age of onset: 5) in her brother; Thyroid  disease in her mother. SOCIAL HISTORY She  reports that she quit smoking about 25 years ago. Her smoking use included cigarettes. She has never used smokeless tobacco. She reports that she does not drink alcohol and does not use drugs.   MEDICARE WELLNESS OBJECTIVES: Physical activity:   Cardiac risk factors:   Depression/mood screen:      12/12/2020    9:21 AM  Depression screen PHQ 2/9  Decreased Interest 0  Down, Depressed, Hopeless 1  PHQ - 2 Score 1    ADLs:     03/07/2021    6:00 PM 09/21/2020   10:16 AM  In your present state of health, do you have any difficulty performing the following activities:  Hearing? 0  Vision? 0   Difficulty concentrating or making decisions? 0   Walking or climbing stairs? 0   Dressing or bathing? 0   Doing errands, shopping? 0 0     Cognitive Testing  Alert? Yes  Normal Appearance?Yes  Oriented to person? Yes  Place? Yes   Time? Yes  Recall of three objects?  Yes  Can perform simple calculations? Yes  Displays appropriate judgment?Yes  Can read the correct time from a watch face?Yes  EOL planning:    ROS   Objective:     There were no vitals filed for this visit. There is no height or weight on file to calculate BMI.  General appearance: alert, no distress, WD/WN, female HEENT: normocephalic, sclerae anicteric, TMs pearly, nares patent, no discharge or erythema, pharynx normal Oral cavity: MMM, no lesions Neck: supple, no lymphadenopathy, no thyromegaly, no masses Heart: RRR, normal S1, S2, no murmurs Lungs: CTA bilaterally, no wheezes, rhonchi, or rales Abdomen: +bs, soft, non tender, non distended, no masses, no hepatomegaly, no splenomegaly Musculoskeletal: nontender, no swelling, no obvious deformity Extremities: no edema, no cyanosis, no clubbing Pulses: 2+ symmetric, upper and lower extremities, normal cap refill Neurological: alert, oriented x 3, CN2-12 intact, strength normal upper extremities and lower extremities,  sensation normal throughout, DTRs 2+ throughout, no cerebellar signs, gait normal Psychiatric: normal affect, behavior normal, pleasant   EKG: *** AAA [place]: ***  Medicare Attestation I have personally reviewed: The patient's medical and social history Their use of alcohol, tobacco or illicit drugs Their current medications and supplements The patient's functional ability including ADLs,fall risks, home safety risks, cognitive, and hearing and visual impairment Diet and physical activities Evidence for depression or mood disorders  The patient's weight, height, BMI, and visual acuity have been recorded in the chart.  I have made referrals, counseling, and provided education to the patient based on review of the above and I have provided the patient with a written personalized care plan for preventive services.     Darrol Jump, NP   09/04/2021

## 2021-09-04 NOTE — Progress Notes (Signed)
Pt requested refill on Restoril. E-scribed to her pharmacy.

## 2021-09-05 ENCOUNTER — Ambulatory Visit (INDEPENDENT_AMBULATORY_CARE_PROVIDER_SITE_OTHER): Payer: Medicare Other | Admitting: Nurse Practitioner

## 2021-09-05 ENCOUNTER — Encounter: Payer: Self-pay | Admitting: Nurse Practitioner

## 2021-09-05 ENCOUNTER — Other Ambulatory Visit: Payer: Self-pay | Admitting: Nurse Practitioner

## 2021-09-05 VITALS — BP 120/72 | HR 84 | Temp 98.1°F | Resp 16 | Ht 62.0 in | Wt 171.2 lb

## 2021-09-05 DIAGNOSIS — E785 Hyperlipidemia, unspecified: Secondary | ICD-10-CM | POA: Diagnosis not present

## 2021-09-05 DIAGNOSIS — E039 Hypothyroidism, unspecified: Secondary | ICD-10-CM

## 2021-09-05 DIAGNOSIS — Z0000 Encounter for general adult medical examination without abnormal findings: Secondary | ICD-10-CM

## 2021-09-05 DIAGNOSIS — Z79899 Other long term (current) drug therapy: Secondary | ICD-10-CM

## 2021-09-05 DIAGNOSIS — C8313 Mantle cell lymphoma, intra-abdominal lymph nodes: Secondary | ICD-10-CM

## 2021-09-05 DIAGNOSIS — R0989 Other specified symptoms and signs involving the circulatory and respiratory systems: Secondary | ICD-10-CM

## 2021-09-05 DIAGNOSIS — Z1389 Encounter for screening for other disorder: Secondary | ICD-10-CM

## 2021-09-05 DIAGNOSIS — Z0001 Encounter for general adult medical examination with abnormal findings: Secondary | ICD-10-CM

## 2021-09-05 DIAGNOSIS — M810 Age-related osteoporosis without current pathological fracture: Secondary | ICD-10-CM

## 2021-09-05 DIAGNOSIS — I5022 Chronic systolic (congestive) heart failure: Secondary | ICD-10-CM | POA: Diagnosis not present

## 2021-09-05 DIAGNOSIS — R6889 Other general symptoms and signs: Secondary | ICD-10-CM

## 2021-09-05 DIAGNOSIS — I700 Atherosclerosis of aorta: Secondary | ICD-10-CM | POA: Diagnosis not present

## 2021-09-05 DIAGNOSIS — Z8739 Personal history of other diseases of the musculoskeletal system and connective tissue: Secondary | ICD-10-CM

## 2021-09-05 DIAGNOSIS — E559 Vitamin D deficiency, unspecified: Secondary | ICD-10-CM

## 2021-09-05 DIAGNOSIS — R7303 Prediabetes: Secondary | ICD-10-CM

## 2021-09-05 NOTE — Patient Instructions (Signed)

## 2021-09-06 LAB — URINALYSIS, ROUTINE W REFLEX MICROSCOPIC
Bilirubin Urine: NEGATIVE
Hgb urine dipstick: NEGATIVE
Ketones, ur: NEGATIVE
Leukocytes,Ua: NEGATIVE
Nitrite: NEGATIVE
Protein, ur: NEGATIVE
Specific Gravity, Urine: 1.015 (ref 1.001–1.035)
pH: 6 (ref 5.0–8.0)

## 2021-09-06 LAB — LIPID PANEL
Cholesterol: 250 mg/dL — ABNORMAL HIGH (ref <200)
HDL: 59 mg/dL (ref >=50)
LDL Cholesterol (Calc): 155 mg/dL (calc) — ABNORMAL HIGH
Non-HDL Cholesterol (Calc): 191 mg/dL (calc) — ABNORMAL HIGH (ref <130)
Total CHOL/HDL Ratio: 4.2 (calc) (ref <5.0)
Triglycerides: 197 mg/dL — ABNORMAL HIGH (ref <150)

## 2021-09-06 LAB — HEMOGLOBIN A1C
Hgb A1c MFr Bld: 5.5 % of total Hgb (ref <5.7)
Mean Plasma Glucose: 111 mg/dL
eAG (mmol/L): 6.2 mmol/L

## 2021-09-06 LAB — VITAMIN D 25 HYDROXY (VIT D DEFICIENCY, FRACTURES): Vit D, 25-Hydroxy: 29 ng/mL — ABNORMAL LOW (ref 30–100)

## 2021-09-06 LAB — TSH: TSH: 1.38 mIU/L (ref 0.40–4.50)

## 2021-09-06 LAB — MAGNESIUM: Magnesium: 2.1 mg/dL (ref 1.5–2.5)

## 2021-09-11 ENCOUNTER — Other Ambulatory Visit: Payer: Self-pay | Admitting: Internal Medicine

## 2021-09-11 ENCOUNTER — Telehealth: Payer: Self-pay | Admitting: Internal Medicine

## 2021-09-11 NOTE — Telephone Encounter (Signed)
Scheduled per 08/29 los, patient has been called and notified.  

## 2021-09-12 ENCOUNTER — Telehealth: Payer: Self-pay

## 2021-09-12 NOTE — Telephone Encounter (Signed)
Call received from Dr. Glean Salen Office advising pt request they call us to inform Dr. Julien Nordmann that it is recommended that pt have a follow-up scan, PET preferred.

## 2021-09-17 ENCOUNTER — Other Ambulatory Visit: Payer: Self-pay | Admitting: Internal Medicine

## 2021-09-17 DIAGNOSIS — C8313 Mantle cell lymphoma, intra-abdominal lymph nodes: Secondary | ICD-10-CM

## 2021-09-19 ENCOUNTER — Encounter: Payer: Self-pay | Admitting: Internal Medicine

## 2021-10-03 ENCOUNTER — Other Ambulatory Visit (HOSPITAL_COMMUNITY): Payer: Self-pay

## 2021-10-03 ENCOUNTER — Ambulatory Visit (HOSPITAL_COMMUNITY)
Admission: RE | Admit: 2021-10-03 | Discharge: 2021-10-03 | Disposition: A | Discharge status: Home / Self Care | Payer: Medicare Other | Source: Ambulatory Visit | Attending: Internal Medicine | Admitting: Internal Medicine

## 2021-10-03 VITALS — BP 148/92 | HR 87 | Wt 174.4 lb

## 2021-10-03 DIAGNOSIS — I110 Hypertensive heart disease with heart failure: Secondary | ICD-10-CM | POA: Insufficient documentation

## 2021-10-03 DIAGNOSIS — I340 Nonrheumatic mitral (valve) insufficiency: Secondary | ICD-10-CM | POA: Diagnosis not present

## 2021-10-03 DIAGNOSIS — Z79899 Other long term (current) drug therapy: Secondary | ICD-10-CM | POA: Insufficient documentation

## 2021-10-03 DIAGNOSIS — R0602 Shortness of breath: Secondary | ICD-10-CM | POA: Diagnosis present

## 2021-10-03 DIAGNOSIS — Z8572 Personal history of non-Hodgkin lymphomas: Secondary | ICD-10-CM | POA: Insufficient documentation

## 2021-10-03 DIAGNOSIS — Z7984 Long term (current) use of oral hypoglycemic drugs: Secondary | ICD-10-CM | POA: Diagnosis not present

## 2021-10-03 DIAGNOSIS — I493 Ventricular premature depolarization: Secondary | ICD-10-CM | POA: Diagnosis not present

## 2021-10-03 DIAGNOSIS — E782 Mixed hyperlipidemia: Secondary | ICD-10-CM

## 2021-10-03 DIAGNOSIS — I472 Ventricular tachycardia, unspecified: Secondary | ICD-10-CM | POA: Insufficient documentation

## 2021-10-03 DIAGNOSIS — Z86718 Personal history of other venous thrombosis and embolism: Secondary | ICD-10-CM | POA: Insufficient documentation

## 2021-10-03 DIAGNOSIS — Z7901 Long term (current) use of anticoagulants: Secondary | ICD-10-CM | POA: Insufficient documentation

## 2021-10-03 DIAGNOSIS — I5022 Chronic systolic (congestive) heart failure: Secondary | ICD-10-CM | POA: Insufficient documentation

## 2021-10-03 LAB — COMPREHENSIVE METABOLIC PANEL
ALT: 23 U/L (ref 0–44)
AST: 29 U/L (ref 15–41)
Albumin: 4.2 g/dL (ref 3.5–5.0)
Alkaline Phosphatase: 101 U/L (ref 38–126)
Anion gap: 12 (ref 5–15)
BUN: 20 mg/dL (ref 8–23)
CO2: 27 mmol/L (ref 22–32)
Calcium: 10.1 mg/dL (ref 8.9–10.3)
Chloride: 100 mmol/L (ref 98–111)
Creatinine, Ser: 1.42 mg/dL — ABNORMAL HIGH (ref 0.44–1.00)
GFR, Estimated: 41 mL/min — ABNORMAL LOW (ref >60)
Glucose, Bld: 98 mg/dL (ref 70–99)
Potassium: 4.1 mmol/L (ref 3.5–5.1)
Sodium: 139 mmol/L (ref 135–145)
Total Bilirubin: 0.8 mg/dL (ref 0.3–1.2)
Total Protein: 7.1 g/dL (ref 6.5–8.1)

## 2021-10-03 LAB — CBC
HCT: 46.4 % — ABNORMAL HIGH (ref 36.0–46.0)
Hemoglobin: 15 g/dL (ref 12.0–15.0)
MCH: 30.1 pg (ref 26.0–34.0)
MCHC: 32.3 g/dL (ref 30.0–36.0)
MCV: 93 fL (ref 80.0–100.0)
Platelets: 273 10*3/uL (ref 150–400)
RBC: 4.99 MIL/uL (ref 3.87–5.11)
RDW: 15.5 % (ref 11.5–15.5)
WBC: 3.5 10*3/uL — ABNORMAL LOW (ref 4.0–10.5)
nRBC: 0 % (ref 0.0–0.2)

## 2021-10-03 LAB — BRAIN NATRIURETIC PEPTIDE: B Natriuretic Peptide: 603.1 pg/mL — ABNORMAL HIGH (ref 0.0–100.0)

## 2021-10-03 LAB — TSH: TSH: 1.882 u[IU]/mL (ref 0.350–4.500)

## 2021-10-03 LAB — T4, FREE: Free T4: 1.32 ng/dL — ABNORMAL HIGH (ref 0.61–1.12)

## 2021-10-03 MED ORDER — ROSUVASTATIN CALCIUM 10 MG PO TABS
10.0000 mg | ORAL_TABLET | Freq: Every day | ORAL | 3 refills | Status: DC
  Filled 2021-10-03: qty 90, 90d supply, fill #0

## 2021-10-03 NOTE — Patient Instructions (Signed)
HOLD Midodrine if Systolic blood pressure is less than 100, ONLY  take if systolic above 673.  Start Crestor 10 mg daily.  Labs done today, your results will be available in MyChart, we will contact you for abnormal readings.  Your physician has requested that you have an echocardiogram. Echocardiography is a painless test that uses sound waves to create images of your heart. It provides your doctor with information about the size and shape of your heart and how well your heart's chambers and valves are working. This procedure takes approximately one hour. There are no restrictions for this procedure.  Your physician recommends that you schedule a follow-up appointment in: 4 months ( January 2024)  ** please call the office in November to arrange your follow up appointment **  If you have any questions or concerns before your next appointment please send [place] a message through [place] or call our office at [phone].    TO LEAVE A MESSAGE FOR THE NURSE SELECT OPTION 2, PLEASE LEAVE A MESSAGE INCLUDING: YOUR NAME DATE OF BIRTH CALL BACK NUMBER REASON FOR CALL**this is important as we prioritize the call backs  YOU WILL RECEIVE A CALL BACK THE SAME DAY AS LONG AS YOU CALL BEFORE 4:00 PM  At the [facility], you and your health needs are our priority. As part of our continuing mission to provide you with exceptional heart care, we have created designated Provider Care Teams. These Care Teams include your primary Cardiologist (physician) and Advanced Practice Providers (APPs- Physician Assistants and Nurse Practitioners) who all work together to provide you with the care you need, when you need it.   You may see any of the following providers on your designated Care Team at your next follow up: Dr Glori Bickers Dr Loralie Champagne Dr. Roxana Hires, NP Lyda Jester, [place] [facility] [place], [place] Forestine Na, NP Audry Riles, PharmD   Please be  sure to bring in all your medications bottles to every appointment.

## 2021-10-03 NOTE — Progress Notes (Signed)
Advanced Heart Failure Clinic Note   Referring Physician: PCP: Natalie Pinto, MD PCP-Cardiologist: Natalie Munroe, MD  [facility]: [facility]   Reason for Visit:  F/u for Chronic Systolic Heart Failure  HPI:  Natalie Baker is a 66 y.o. female with a hx of palpitations thought to be PVCs, HTN, non hodgkins lymphoma treated w/ R-CHOP and systolic HF.   Admitted 6/21 with acute HF. ECHO  EF 20-25% w/ mod-severe MR.  RV moderately reduced. Developed VT and was started on amiodarone and transferred to [facility] for [facility] which showed normal coronaries, elevated filling pressures w/ low CO c/w cardiogenic shock. CI was 1.7. She was started on milrinone, IV amiodarone and IV Lasix for diuresis.   Echo 9/21: EF 40-45%   On repeat echo 05/04/20 EF had normalized to 50-55% G2DD.    At oncology f/u 6/22,  repeat CT scan unfortunately showed evidence for disease progression with mild increase in the small bilateral axillary lymph nodes and moderate increase in the left supraclavicular as well as ileocolic mesenteric lymph nodes and left external iliac lymph nodes but mild increase in the periaortic retroperitoneal lymph nodes. She started treatment with acalabrutinib 100 mg p.o. twice daily on June 17 2020 and [facility] referred her to [facility] for bone marrow transplant evaluation. As part of w/u at [facility], an echocardiogram was obtained and showed drop in LVEF back down to 40% w/ echodensity vs thickening of the left ventricular apex concerning for possible LV thrombus. Subsequently, she was referred back to [facility] for further evaluation.    Had follow up 10/22, and endorsed stable NYHA II symptoms. Losartan was increased and cMRI arranged to assess for LV thrombus.    cMRI 11/22 LVEF 39%, no LV thrombus noted.   Follow up 11/22 Not a candidate for stem cell transplant, but will trial CAR-T therapy soon.    Recently d/c from [facility] after CAR-T therapy, had cytokine storm, taken off all HF meds due to low BP.  Echo 1/23 with EF 40-45%.    Most recently, she was direct admitted from HF clinic on 03/07/21 with acute on chronic systolic CHF with low-output. Bedside echo with EF < 20%. PICC line placed. Initial co-ox 34% and she was started on inotrope support with milrinone. Diuresed with IV lasix then transitioned to po torsemide. She failed initial milrinone wean then was eventually titrated off milrinone with stable co-ox. Torsemide decreased to 20 mg daily (starting 03/15) at discharge d/t AKI and low volume. Scr 1.24>>1.64 (baseline 1.0). She was started on GDMT. Had frequent PVCs and runs of NSVT which improved with addition of po amiodarone. Course also complicated by left brachial DVT for which she was started on Eliquis. Discharged on 3/13. D/c wt 172 lb.   At post hospital f/u, she was doing well. Wt only up 2 lb since d/c, 172>>174 lb. ReDs Clip 32%. BP stable, 124/78. EKG showed NSR. No PVCs. NYHA class II. However, her labs returned abnormal. Digoxin level was elevated at 2.5 (EKG ok), Scr had risen further from 1.64>>1.73. She was instructed to stop Digoxin and to hold torsemide x 1 day, then reduced frequency to qod dosing. She had repeat labs on 3/20 which showed Dig level down to 0.5.  SCr back down to 1.6 w/ low K at 3.1. She was instructed to remain off dig and spiro increased to 25 mg daily.   Since that time, she had f/u at the [facility] on 3/23 and had repeat labs. SCr  down to 1.44, K 4.0. Per [facility] note, she remains on observation w/ plans to continue monthly blood work and f/u w/ him in 2 months.   Echo 3/23 EF 20-25%  Echo 06/29/21 EF 35-40% (read as 30-35%) severe MR   TEE 07/04/21 EF 20-25% severe MR moderate  She returns back today for f/u. Feels good. Currently not receiving any chemo. Still working. Will get SOB with stairs. No CP, dizziness, edema. On torsemide 20 every other day. Taking midodrine bid. SBP ~ 110 at home.     Cardiac/Vascular Studies: Echo 06/2019 EF  20-25%  moderately reduced RV function with severe MR/TR Echo 9/21: EF 40-45%, RV normal  Echo  4/22: EF 50-55% G2DD. RV normal  Echo [facility] 10/04/20: EF 40%, grade I DD, RV ok, mild MR, mild LAE, small circumferential pericardial effusion, Echodensity vs thickening of the left ventricular apex, cannot rule out thrombus Echo 3/23 LVEF 20-25%, RV mildly reduced. Small pericardial effusion, no tamponade    cMRI 11/22 EF 39%, no LV thrombus   Cath 6/18 with normal cors. Low output. Ao = 109/71 (81) LV =  99/23 RA =  15 RV =  43/18 PA =  35/14 (25) PCW = 22 Fick cardiac output/index = 3.3/1.7 PVR = < 1.0 WU Ao sat = 100% PA sat = 49%, 53%    Assessment: 1. Normal coronary arteries 2. Severe NICM EF 20% 3. Elevated filling pressures and low CO c/w cardiogenic shock 4. Brief run of VT   Venous duplex LUE, 03/13/21: Summary:  Right:  No evidence of thrombosis in the subclavian.     Left:  Findings consistent with partial age indeterminate deep vein thrombosis  involving the left brachial veins.   Review of systems complete and found to be negative unless listed in HPI.     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 1/2 tablet 2 times daily) 90 tablet 3   apixaban (ELIQUIS) 5 MG TABS tablet Take 5 mg by mouth 2 (two) times daily.     cholecalciferol (VITAMIN D3) 25 MCG (1000 UNIT) tablet Take 1,000 Units by mouth once a week.     estradiol (VIVELLE-DOT) 0.05 MG/24HR  patch Place 1 patch onto the skin 2 (two) times a week. Tuesdays & Fridays     FARXIGA 10 MG TABS tablet TAKE 1 TABLET BY MOUTH EVERY DAY 30 tablet 11   ivabradine (CORLANOR) 7.5 MG TABS tablet Take 1 tablet by mouth 2 times daily with a meal. 60 tablet 3   levothyroxine (SYNTHROID) 100 MCG tablet TAKE 1 TABLET BY MOUTH DAILY ON AN EMPTY STOMACH WITH ONLY WATER FOR 30 MINUTES & NO ANTACID/CALCIUM/MAGNESIUM FOR 4 HOURS & AVOID BIOTIN 90 tablet 1   midodrine (PROAMATINE) 5 MG tablet Take 2.5 mg by mouth 2 (two) times daily with a meal. Hold for systolic bp <  110.     ondansetron (ZOFRAN) 8 MG tablet Take 8 mg by mouth every 8 (eight) hours as needed for vomiting or nausea.     ondansetron (ZOFRAN-ODT) 4 MG disintegrating tablet Take 1 tablet (4 mg total) by mouth every 8 (eight) hours as needed for nausea or vomiting. 30 tablet 3   potassium chloride SA (KLOR-CON M) 20 MEQ tablet Take 20 mEq by mouth See admin instructions. Take 1 tablet (20 meq) by mouth every other day (scheduled); may take additional dose daily if needed for fluid retention.     prochlorperazine (COMPAZINE) 10 MG tablet TAKE 1 TABLET BY MOUTH EVERY 6 HOURS AS NEEDED FOR NAUSEA OR VOMITING. 30 tablet 0   spironolactone (ALDACTONE) 25 MG tablet TAKE 1 TABLET BY MOUTH EVERY DAY (Patient taking differently: 1/2 tablet daily) 90 tablet 3   sulfamethoxazole-trimethoprim (BACTRIM DS) 800-160 MG tablet Take 1 tablet by mouth See admin instructions. Take 1 tablet twice daily on Saturdays and Sundays ONLY     temazepam (RESTORIL) 15 MG capsule Take 1 capsule (15 mg total) by mouth at bedtime as needed for sleep. 30 capsule 0   torsemide (DEMADEX) 20 MG tablet Take 1 tablet by mouth every other day. (Patient taking differently: Take 20 mg by mouth See admin instructions. Take 1 tablet (20 mg) by mouth every other day(scheduled); may take additional dose daily if needed for fluid retention.) 30 tablet 6   valACYclovir (VALTREX) 500 MG tablet Take  500 mg by mouth 2 (two) times daily.     No current facility-administered medications for this encounter.    Allergies  Allergen Reactions   Adriamycin [Doxorubicin] Other (See Comments)    Acute heart failure (chemo from Jan - May 2021)   Ppd [Tuberculin Purified Protein Derivative] Swelling    Positive reaction.  Negative Chest Xray 06/16/12      Social History   Socioeconomic History   Marital status: Married    Spouse name: Not on file   Number of children: 1   Years of education: Not on file   Highest education level: Not on file  Occupational History   Occupation: Theatre manager  Tobacco Use   Smoking status: Former    Years: 15.00    Types: Cigarettes    Quit date: 01/08/1996    Years since quitting: 25.7   Smokeless tobacco: Never   Tobacco comments:    quit 1998 but is exposed to 2nd hand smoke  Vaping Use   Vaping Use: Never used  Substance and Sexual Activity   Alcohol use: No   Drug use: No   Sexual activity: Yes    Partners: Male    Birth control/protection: Post-menopausal  Other Topics Concern   Not on file  Social History Narrative   Married, one daughter, 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 in the Last Year: Never true  Transportation Needs: No Transportation Needs (03/22/2021)   PRAPARE - Hydrologist (Medical): No    Lack of Transportation (Non-Medical): No  Physical Activity: Inactive (08/13/2018)   Exercise Vital  Sign    Days of Exercise per Week: 0 days    Minutes of Exercise per Session: 0 min  Stress: No Stress Concern Present (08/13/2018)   [place]    Feeling of Stress : Only a little  Social Connections: Moderately Integrated (07/02/2019)   Social Connection and Isolation Panel [NHANES]    Frequency of Communication with Friends and Family: More than three times a week    Frequency of Social Gatherings with Friends and Family: Once a week    Attends Religious Services: More than 4 times per year    Active Member of [employer] or Organizations: No    Attends Archivist Meetings: Never    Marital Status: Married  Human resources officer Violence: Not on file      Family History  Problem Relation Age of Onset   Heart disease Mother 35       chf   Diabetes Mother    Thyroid disease Mother    Cancer Mother 60       female/ skin   Hypertension Mother    Liver disease Mother    Hypertension Father    Alcohol abuse Father    Heart disease Father    Breast cancer Sister 91   Diabetes Sister    Hypothyroidism Sister    Hashimoto's thyroiditis Sister    Hyperlipidemia Brother    Hypertension Brother        x 2   Diabetes Brother    Hypothyroidism Brother    Prostate cancer Brother 61   Liver disease Maternal Grandmother    Diabetes Paternal Grandmother    Breast cancer Maternal Aunt    Hyperlipidemia Maternal Aunt    Heart disease Maternal Aunt    Bone cancer Maternal Aunt        metastatic   Breast cancer Niece 66   Colon cancer Neg Hx    Colon polyps Neg Hx    Esophageal cancer Neg Hx    Rectal cancer Neg Hx    Stomach cancer Neg Hx     Vitals:   10/03/21 0954  BP: (!) 140/80  Pulse: 87  SpO2: 97%  Weight: 79.1 kg (174 lb 6.4 oz)    PHYSICAL EXAM: General:  Well appearing. No resp difficulty HEENT: normal Neck: supple. no JVD. Carotids 2+ bilat; no bruits. No lymphadenopathy or thryomegaly appreciated. Cor: PMI nondisplaced. Regular rate & rhythm. No rubs, gallops or murmurs. Lungs: clear Abdomen: soft, nontender, nondistended. No  hepatosplenomegaly. No bruits or masses. Good bowel sounds. Extremities: no cyanosis, clubbing, rash, edema Neuro: alert & orientedx3, cranial nerves grossly intact. moves all 4 extremities w/o difficulty. Affect pleasant  ASSESSMENT & PLAN:  1. Chronic Systolic HF  - Echo 0/27 EF 55-60%. RV normal - Echo 6/21 EF 20-25%  moderately reduced RV function with severe MR/TR - Cath 06/25/19 no CAD. - Drop in EF felt most likely from R-CHOP - Echo 05/04/20 w/ EF 50-55% G2DD. RV normal - Echo 9/22 at [facility] EF back down to 40% w/ ? LV thrombus - cMRI 11/22 LVEF 39%, no LV clot - Echo 1/23 [facility]): EF 40-45% - Echo 03/08/21: EF 20-25%, RV mildly reduced, RVSP 44 mmHg - Direct admit from clinic 03/07/21  Repeat echo, EF 20%.  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 Required milrinone for low-output.  - Echo 06/29/21 EF improved 35-40% (read as 30-35%) with new severe MR - TEE 07/04/21 EF 20-25% severe MR moderate - Stable NYHA  II. Volume stable on torsemide 20 qoD - Continue farxiga 10 mg daily - No ARNi/ARB w/ soft BPs and recent AKI  - Off Digoxin due to elevated level  - Continue spiro 25 mg daily.  - Continue ivabradine 7.5 mg twice a day.  - Stop midodrine. Only take 2.5 id SBP < 100  2. H/o VT + PVCs - No palpitations RRR on exam   - on Amio 100 mg bid  3. Severe MR  - likely functional - TEE 07/04/21 EF 20-25% severe MR moderate - repeat echo. If still with severe MR will need TEE   4. Stage IV non-Hodgkin's lymphoma, mantle cell lymphoma   - s/p R-CHOP w/ Neulasta. - Evidence of disease progression on f/u Chest CT 6/22. - s/p CAR-T therapy. - Followed at [facility]  5. Partial Lt Brachial DVT - Diagnosed 03/13/21 - on Eliquis. Denies abnormal bleeding    6. HL - LDL 155  - start crestor 10    Glori Bickers, MD 10/03/21

## 2021-10-05 ENCOUNTER — Other Ambulatory Visit (HOSPITAL_COMMUNITY): Payer: Self-pay

## 2021-10-10 ENCOUNTER — Other Ambulatory Visit (HOSPITAL_COMMUNITY): Payer: Self-pay

## 2021-10-30 ENCOUNTER — Other Ambulatory Visit: Payer: Medicare Other

## 2021-10-30 ENCOUNTER — Ambulatory Visit: Payer: Medicare Other | Admitting: Internal Medicine

## 2021-10-31 ENCOUNTER — Encounter (HOSPITAL_COMMUNITY)
Admission: RE | Admit: 2021-10-31 | Discharge: 2021-10-31 | Disposition: A | Discharge status: Home / Self Care | Payer: Medicare Other | Source: Ambulatory Visit | Attending: Internal Medicine | Admitting: Internal Medicine

## 2021-10-31 DIAGNOSIS — C8313 Mantle cell lymphoma, intra-abdominal lymph nodes: Secondary | ICD-10-CM | POA: Insufficient documentation

## 2021-10-31 LAB — GLUCOSE, CAPILLARY: Glucose-Capillary: 100 mg/dL — ABNORMAL HIGH (ref 70–99)

## 2021-10-31 MED ORDER — FLUDEOXYGLUCOSE F - 18 (FDG) INJECTION
8.5000 | Freq: Once | INTRAVENOUS | Status: AC | PRN
  Administered 2021-10-31: 8.7 via INTRAVENOUS

## 2021-11-01 ENCOUNTER — Other Ambulatory Visit: Payer: Medicare Other

## 2021-11-01 ENCOUNTER — Ambulatory Visit: Payer: Medicare Other | Admitting: Internal Medicine

## 2021-11-05 ENCOUNTER — Inpatient Hospital Stay (HOSPITAL_BASED_OUTPATIENT_CLINIC_OR_DEPARTMENT_OTHER): Payer: Medicare Other | Admitting: Internal Medicine

## 2021-11-05 ENCOUNTER — Inpatient Hospital Stay: Payer: Medicare Other | Attending: Internal Medicine

## 2021-11-05 ENCOUNTER — Other Ambulatory Visit: Payer: Self-pay

## 2021-11-05 VITALS — BP 115/57 | HR 68 | Temp 98.2°F | Resp 18 | Wt 177.0 lb

## 2021-11-05 DIAGNOSIS — G4700 Insomnia, unspecified: Secondary | ICD-10-CM | POA: Insufficient documentation

## 2021-11-05 DIAGNOSIS — C8313 Mantle cell lymphoma, intra-abdominal lymph nodes: Secondary | ICD-10-CM

## 2021-11-05 DIAGNOSIS — I509 Heart failure, unspecified: Secondary | ICD-10-CM | POA: Diagnosis not present

## 2021-11-05 DIAGNOSIS — Z90710 Acquired absence of both cervix and uterus: Secondary | ICD-10-CM | POA: Diagnosis not present

## 2021-11-05 DIAGNOSIS — C8318 Mantle cell lymphoma, lymph nodes of multiple sites: Secondary | ICD-10-CM | POA: Insufficient documentation

## 2021-11-05 DIAGNOSIS — Z95828 Presence of other vascular implants and grafts: Secondary | ICD-10-CM

## 2021-11-05 LAB — CMP (CANCER CENTER ONLY)
ALT: 21 U/L (ref 0–44)
AST: 25 U/L (ref 15–41)
Albumin: 4.2 g/dL (ref 3.5–5.0)
Alkaline Phosphatase: 87 U/L (ref 38–126)
Anion gap: 9 (ref 5–15)
BUN: 20 mg/dL (ref 8–23)
CO2: 27 mmol/L (ref 22–32)
Calcium: 9.1 mg/dL (ref 8.9–10.3)
Chloride: 102 mmol/L (ref 98–111)
Creatinine: 1.3 mg/dL — ABNORMAL HIGH (ref 0.44–1.00)
GFR, Estimated: 45 mL/min — ABNORMAL LOW (ref >60)
Glucose, Bld: 114 mg/dL — ABNORMAL HIGH (ref 70–99)
Potassium: 3.9 mmol/L (ref 3.5–5.1)
Sodium: 138 mmol/L (ref 135–145)
Total Bilirubin: 0.4 mg/dL (ref 0.3–1.2)
Total Protein: 6.8 g/dL (ref 6.5–8.1)

## 2021-11-05 LAB — LACTATE DEHYDROGENASE: LDH: 244 U/L — ABNORMAL HIGH (ref 98–192)

## 2021-11-05 LAB — CBC WITH DIFFERENTIAL (CANCER CENTER ONLY)
Abs Immature Granulocytes: 0.34 10*3/uL — ABNORMAL HIGH (ref 0.00–0.07)
Basophils Absolute: 0 10*3/uL (ref 0.0–0.1)
Basophils Relative: 1 %
Eosinophils Absolute: 0.1 10*3/uL (ref 0.0–0.5)
Eosinophils Relative: 2 %
HCT: 40.9 % (ref 36.0–46.0)
Hemoglobin: 13.5 g/dL (ref 12.0–15.0)
Immature Granulocytes: 10 %
Lymphocytes Relative: 7 %
Lymphs Abs: 0.2 10*3/uL — ABNORMAL LOW (ref 0.7–4.0)
MCH: 30.1 pg (ref 26.0–34.0)
MCHC: 33 g/dL (ref 30.0–36.0)
MCV: 91.1 fL (ref 80.0–100.0)
Monocytes Absolute: 0.4 10*3/uL (ref 0.1–1.0)
Monocytes Relative: 12 %
Neutro Abs: 2.3 10*3/uL (ref 1.7–7.7)
Neutrophils Relative %: 68 %
Platelet Count: 234 10*3/uL (ref 150–400)
RBC: 4.49 MIL/uL (ref 3.87–5.11)
RDW: 15.4 % (ref 11.5–15.5)
Smear Review: NORMAL
WBC Count: 3.4 10*3/uL — ABNORMAL LOW (ref 4.0–10.5)
nRBC: 0 % (ref 0.0–0.2)

## 2021-11-05 MED ORDER — HEPARIN SOD (PORK) LOCK FLUSH 100 UNIT/ML IV SOLN
500.0000 [IU] | Freq: Once | INTRAVENOUS | Status: AC | PRN
  Administered 2021-11-05: 500 [IU]

## 2021-11-05 MED ORDER — SODIUM CHLORIDE 0.9% FLUSH
10.0000 mL | INTRAVENOUS | Status: DC | PRN
  Administered 2021-11-05: 10 mL

## 2021-11-05 NOTE — Progress Notes (Signed)
[place] [phone]   [phone]  OFFICE PROGRESS NOTE  [name], MD [address] [place] [address]  DIAGNOSIS: Stage IV non-Hodgkin's lymphoma, mantle cell lymphoma.  She presented with left iliac and retroperitoneal lymphadenopathy.  She also presented with left axillary lymphadenopathy and bone marrow involvement.  She was diagnosed in December 2020.   PRIOR THERAPY:  1) Systemic chemotherapy with R-CHOP with Neulasta support.  First dose on 01/27/2019. Status post 6 cycles.   2) Acalabrutinib 100 mg p.o. twice daily.  First dose started June 17, 2020.  Status post 3 months of treatment. 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 female returns to the clinic today for follow-up visit accompanied by her daughter Natalie Baker The patient is feeling fine today with no concerning complaints except for some aching pain.  She denied having any chest pain, shortness of breath, cough or hemoptysis.  She has no nausea, vomiting, diarrhea or constipation.  She has no headache or visual changes.  She denied having any significant weight loss or night sweats.  She has been on observation now for several months and doing well.  She had a PET scan performed recently and she is here for evaluation and discussion of her PET scan results and recommendation regarding her condition.   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  MEDICATIONS:  Current Outpatient Medications  Medication Sig Dispense Refill   amiodarone (PACERONE) 200 MG tablet TAKE 1 TABLET BY MOUTH EVERY DAY (Patient taking differently: Take 200 mg by mouth daily. 1/2 tablet 2 times daily) 90 tablet 3   apixaban (ELIQUIS) 5 MG TABS tablet Take 5 mg by mouth 2 (two) times daily.     cholecalciferol (VITAMIN D3) 25 MCG (1000 UNIT) tablet Take 1,000 Units by mouth once a week.     estradiol (VIVELLE-DOT) 0.05 MG/24HR patch Place 1 patch onto the skin 2 (two) times a week. Tuesdays & Fridays     FARXIGA 10 MG TABS tablet TAKE 1 TABLET BY MOUTH EVERY DAY 30 tablet 11   ivabradine (CORLANOR) 7.5 MG TABS tablet Take 1 tablet by mouth 2 times daily with a meal. 60 tablet 3   levothyroxine (SYNTHROID) 100 MCG tablet TAKE 1 TABLET BY MOUTH DAILY ON AN EMPTY STOMACH WITH ONLY WATER FOR 30 MINUTES & NO ANTACID/CALCIUM/MAGNESIUM FOR 4 HOURS & AVOID BIOTIN 90 tablet 1   midodrine (PROAMATINE) 5 MG tablet Take 2.5 mg by mouth 2 (two) times daily with a meal. Hold for systolic bp < 11300    ondansetron (ZOFRAN) 8 MG tablet Take 8 mg by mouth  every 8 (eight) hours as needed for vomiting or nausea.     ondansetron (ZOFRAN-ODT) 4 MG disintegrating tablet Take 1 tablet (4 mg total) by mouth every 8 (eight) hours as needed for nausea or vomiting. 30 tablet 3   potassium chloride SA (KLOR-CON M) 20 MEQ tablet Take 20 mEq by mouth See admin instructions. Take 1 tablet (20 meq) by mouth every other day (scheduled); may take additional dose daily if needed for fluid retention.     prochlorperazine (COMPAZINE) 10 MG tablet TAKE 1 TABLET BY MOUTH EVERY 6 HOURS AS NEEDED FOR NAUSEA OR VOMITING. 30 tablet 0   rosuvastatin  (CRESTOR) 10 MG tablet Take 1 tablet (10 mg total) by mouth daily. 90 tablet 3   spironolactone (ALDACTONE) 25 MG tablet TAKE 1 TABLET BY MOUTH EVERY DAY (Patient taking differently: 1/2 tablet daily) 90 tablet 3   sulfamethoxazole-trimethoprim (BACTRIM DS) 800-160 MG tablet Take 1 tablet by mouth See admin instructions. Take 1 tablet twice daily on Saturdays and _0 91 for emdometriosis   RIGHT/LEFT HEART CATH AND CORONARY ANGIOGRAPHY N/A 06/25/2019   Procedure: RIGHT/LEFT HEART CATH AND CORONARY ANGIOGRAPHY;  Surgeon: Jolaine Artist, MD;  Location: [facility];  Service: Cardiovascular;  Laterality: N/A;   TEE WITHOUT CARDIOVERSION N/A 07/04/2021   Procedure: TRANSESOPHAGEAL ECHOCARDIOGRAM (TEE);  Surgeon: Jolaine Artist, MD;  Location: [facility] ENDOSCOPY;  Service: Cardiovascular;  Laterality: N/A;   UPPER GASTROINTESTINAL ENDOSCOPY      REVIEW OF SYSTEMS:  Constitutional: negative Eyes: negative Ears, nose, mouth, throat,  and face: negative Respiratory: negative Cardiovascular: negative Gastrointestinal: negative Genitourinary:negative Integument/breast: negative Hematologic/lymphatic: negative Musculoskeletal:negative Neurological: negative Behavioral/Psych: negative Endocrine: negative Allergic/Immunologic: negative   PHYSICAL EXAMINATION: General appearance: alert, cooperative, and no distress Head: Normocephalic, without obvious abnormality, atraumatic Neck: no adenopathy, no JVD, supple, symmetrical, trachea midline, and thyroid not enlarged, symmetric, no tenderness/mass/nodules Lymph nodes: Cervical, supraclavicular, and axillary nodes normal. Resp: clear to auscultation bilaterally Back: symmetric, no curvature. ROM normal. No CVA tenderness. Cardio: regular rate and rhythm, S1, S2 normal, no murmur, click, rub or gallop GI: soft, non-tender; bowel sounds normal; no masses,  no organomegaly Extremities: extremities normal, atraumatic, no cyanosis or edema Neurologic: Alert and oriented X 3, normal strength and tone. Normal symmetric reflexes. Normal coordination and gait  ECOG PERFORMANCE STATUS: 1 - Symptomatic but completely ambulatory  Blood pressure (!) 115/57, pulse 68, temperature 98.2 F (36.8 C), temperature source Oral, resp.  rate 18, weight 177 lb (80.3 kg), SpO2 98 %.  LABORATORY DATA: Lab Results  Component Value Date   WBC 3.4 (L) 11/05/2021   HGB 13.5 11/05/2021   HCT 40.9 11/05/2021   MCV 91.1 11/05/2021   PLT 234 11/05/2021      Chemistry      Component Value Date/Time   NA 139 10/03/2021 1024   K 4.1 10/03/2021 1024   CL 100 10/03/2021 1024   CO2 27 10/03/2021 1024   BUN 20 10/03/2021 1024   CREATININE 1.42 (H) 10/03/2021 1024   CREATININE 1.33 (H) 09/04/2021 0836   CREATININE 1.03 12/12/2020 0957      Component Value Date/Time   CALCIUM 10.1 10/03/2021 1024   ALKPHOS 101 10/03/2021 1024   AST 29 10/03/2021 1024   AST 21 09/04/2021 0836   ALT 23  10/03/2021 1024   ALT 19 09/04/2021 0836   BILITOT 0.8 10/03/2021 1024   BILITOT 0.4 09/04/2021 0836       RADIOGRAPHIC STUDIES: NM PET Image Restage (PS) Skull Base to Thigh (F-18 FDG)  Result Date: 11/01/2021 CLINICAL DATA:  Subsequent treatment strategy for mantle cell lymphoma. EXAM: NUCLEAR MEDICINE PET SKULL BASE TO THIGH TECHNIQUE: 8.7 mCi F-18 FDG was injected intravenously. Full-ring PET imaging was performed from the skull base to thigh after the radiotracer. CT data was obtained and used for attenuation correction and anatomic localization. Fasting blood glucose: 100 mg/dl COMPARISON:  Multiple priors including most recent CT chest abdomen pelvis dated September 29, 2020 and PET-CT June 10, 2019 FINDINGS: Mediastinal blood pool activity: SUV max 2.14 Liver activity: SUV max 3.63 NECK: No hypermetabolic cervical adenopathy. Incidental CT findings: None. CHEST: Hypermetabolic thoracic adenopathy. Previously indexed axillary and paraesophageal and flowed are as follows: -right retropectoral lymph node is now too small to accurately characterize previously measuring 6 mm. -left axillary lymph node now measures 2 mm in short axis on image 57/4 without abnormal FDG avidity previously measuring 6 mm. Paraesophageal lymph node now measures 4 mm in short axis on image 72/4 without significant abnormal FDG avidity. No hypermetabolic pulmonary nodules or masses. Incidental CT findings: Unchanged size of the 7 mm right middle lobe pulmonary nodule on image 37/7, without abnormal FDG avidity. Stable 4 mm right lower lobe pulmonary nodule on image 40/7 below the resolution of PET-CT. Mosaic attenuation of the lungs. Cardiomegaly. Right chest Port-A-Cath with tip at the superior cavoatrial junction. ABDOMEN/PELVIS: No abnormal hypermetabolic activity within the liver, pancreas, adrenal glands, or spleen. No hypermetabolic abdominopelvic adenopathy. Significant interval decrease in size in the previously  indexed and non indexed abdominopelvic lymph nodes. -Previously indexed lymph nodes of the right hemiabdomen are now too small to accurately characterize without abnormal FDG avidity in the area. -Resolution of the lymph node at the pancreatic tail/splenic hilum without abnormal FDG avidity in the area. -Significant interval decrease in size with near resolution of the retroperitoneal lymph nodes. Previously indexed left periaortic lymph node now measures 3 mm in short axis on image 122/4 without abnormal FDG avidity previously measuring 1.4 mm in short axis. -previously indexed left external iliac lymph node now measures 4 mm in short axis on image 159/4 without abnormal FDG avidity previously measuring 14 mm in short axis. Incidental CT findings: No splenomegaly. SKELETON: No focal hypermetabolic activity to suggest skeletal metastasis. Incidental CT findings: None. IMPRESSION: 1. No hypermetabolic adenopathy above or below the diaphragm. Significant interval decrease in size with near resolution of the thoracic and abdominopelvic lymph nodes. 2.  No splenomegaly or evidence of osseous lymphomatous involvement. 3. Stable right lung pulmonary nodules, which are favored benign. 4. Cardiomegaly with mosaic attenuation of the lungs which may reflect air trapping. Electronically Signed   By: Dahlia Bailiff M.D.   On: 11/01/2021 15:54     ASSESSMENT AND PLAN: This is a very pleasant 66 years old white female recently diagnosed with stage IV less aggressive mantle cell lymphoma presented with left iliac and retroperitoneal lymphadenopathy as well as left axillary and bone marrow involvement in December 2020. The patient completed 6 cycles of systemic chemotherapy with R-CHOP.  She tolerated her treatment fairly well with no concerning adverse effects but few months later she developed congestive heart failure with drop in her ejection fraction to the 20-25%.  This has improved over the last few months and currently  around 55%.  She is followed by Dr. Haroldine Laws. Unfortunately repeat CT scan in June 2022 showed evidence for disease progression with mild increase in the small bilateral axillary lymph nodes and moderate increase in the left supraclavicular as well as ileocolic mesenteric lymph nodes and left external iliac lymph nodes but mild increase in the periaortic retroperitoneal lymph nodes. The patient is started treatment with acalabrutinib 100 mg p.o. twice daily on June 17, 2020  She was seen by Dr. Laverle Hobby at [facility] on July 12, 2020 for discussion of potential autologous stem cell transplant.   The patient had repeat CT scan of the chest, abdomen pelvis performed recently that showed decrease in the size of mesenteric lymph node/mass of the right hemiabdomen, left external iliac lymph nodes and lymph nodes adjacent to the pancreatic tail/splenic hilum but there was mild increase in the size of the mediastinal paraesophageal nodes and retroperitoneal periaortic lymph node as well as increase in the size of the spleen. 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 The patient has been on observation since that time and she is feeling fine with no concerning complaints. She had repeat PET scan performed recently.  I personally and independently reviewed the PET scan and discussed the result with the patient and her daughter. Her PET scan showed significant improvement in her disease with no hypermetabolic residual activity. I recommended for her to continue on observation for now with repeat blood work in 3 months. For the insomnia, I will give her a refill of Restoril 15 mg p.o. nightly. The patient was advised to call immediately if she has any other concerning symptoms in the interval.  The patient voices  understanding of current disease status and treatment options and is in agreement with the current care plan.  All questions were answered. The patient knows to call the clinic with any problems, questions or concerns. We can certainly see the patient much sooner if necessary.  Disclaimer: This note was dictated with voice recognition software. Similar sounding words can inadvertently be transcribed and may not be corrected upon review.

## 2021-11-05 NOTE — Patient Instructions (Signed)

## 2021-11-16 ENCOUNTER — Other Ambulatory Visit (HOSPITAL_COMMUNITY): Payer: Self-pay | Admitting: Internal Medicine

## 2021-12-12 ENCOUNTER — Other Ambulatory Visit: Payer: Self-pay | Admitting: Internal Medicine

## 2021-12-12 ENCOUNTER — Encounter: Payer: Self-pay | Admitting: Nurse Practitioner

## 2021-12-12 ENCOUNTER — Ambulatory Visit (INDEPENDENT_AMBULATORY_CARE_PROVIDER_SITE_OTHER): Payer: Medicare Other | Admitting: Nurse Practitioner

## 2021-12-12 VITALS — BP 126/78 | HR 77 | Temp 95.7°F | Ht 62.75 in | Wt 179.4 lb

## 2021-12-12 DIAGNOSIS — Z0000 Encounter for general adult medical examination without abnormal findings: Secondary | ICD-10-CM

## 2021-12-12 DIAGNOSIS — Z79899 Other long term (current) drug therapy: Secondary | ICD-10-CM

## 2021-12-12 DIAGNOSIS — Z23 Encounter for immunization: Secondary | ICD-10-CM | POA: Diagnosis not present

## 2021-12-12 DIAGNOSIS — M810 Age-related osteoporosis without current pathological fracture: Secondary | ICD-10-CM

## 2021-12-12 DIAGNOSIS — Z0001 Encounter for general adult medical examination with abnormal findings: Secondary | ICD-10-CM

## 2021-12-12 DIAGNOSIS — E039 Hypothyroidism, unspecified: Secondary | ICD-10-CM

## 2021-12-12 DIAGNOSIS — Z1389 Encounter for screening for other disorder: Secondary | ICD-10-CM

## 2021-12-12 DIAGNOSIS — R7303 Prediabetes: Secondary | ICD-10-CM

## 2021-12-12 DIAGNOSIS — I700 Atherosclerosis of aorta: Secondary | ICD-10-CM

## 2021-12-12 DIAGNOSIS — E559 Vitamin D deficiency, unspecified: Secondary | ICD-10-CM

## 2021-12-12 DIAGNOSIS — E785 Hyperlipidemia, unspecified: Secondary | ICD-10-CM

## 2021-12-12 DIAGNOSIS — K5901 Slow transit constipation: Secondary | ICD-10-CM

## 2021-12-12 DIAGNOSIS — R0989 Other specified symptoms and signs involving the circulatory and respiratory systems: Secondary | ICD-10-CM

## 2021-12-12 DIAGNOSIS — I5022 Chronic systolic (congestive) heart failure: Secondary | ICD-10-CM

## 2021-12-12 DIAGNOSIS — C8313 Mantle cell lymphoma, intra-abdominal lymph nodes: Secondary | ICD-10-CM

## 2021-12-12 DIAGNOSIS — R002 Palpitations: Secondary | ICD-10-CM

## 2021-12-12 MED ORDER — TEMAZEPAM 15 MG PO CAPS: 15.0000 mg | ORAL_CAPSULE | Freq: Every evening | ORAL | 0 refills | Status: DC | PRN

## 2021-12-12 NOTE — Progress Notes (Signed)
Complete Physical  Assessment and Plan:  Encounter for Annual Physical Exam with abnormal findings Due annually  Health Maintenance reviewed Healthy lifestyle reviewed and goals set  Atherosclerosis of aorta (HCC) Discussed lifestyle modifications. Recommended diet heavy in fruits and veggies, omega 3's. Decrease consumption of animal meats, cheeses, and dairy products. Remain active and exercise as tolerated. Continue to monitor. Check lipids/TSH   Systolic CHF (HCC) Euvolemic,  Dr. Missy Sabins following closely Emphasized salt restriction, less than 2076m a day. Encouraged daily monitoring of the patient's weight, call office if 5 lb weight loss or gain in a day.  Encouraged regular exercise. If any increasing shortness of breath, swelling, or chest pressure go to ER immediately.  decrease your fluid intake to less than 2 L daily please remember to always increase your potassium intake with any increase of your fluid pill.   Hypothyroidism Continue Levothyroxine. Reminded to take on an empty stomach 30-625ms before food.  Stop any Biotin Supplement 48-72 hours before next TSH level to reduce the risk of falsely low TSH levels. Continue to monitor.     Palpitations Controlled - no recent flares. Continue to avoid triggers Recently placed on Temazepam for insomnia Continue to monitor   Hyperlipidemia Discussed lifestyle modifications. Recommended diet heavy in fruits and veggies, omega 3's. Decrease consumption of animal meats, cheeses, and dairy products. Remain active and exercise as tolerated. Continue to monitor. Check lipids/TSH    Vitamin D deficiency Continue supplement Monitor levels  Osteoporosis Pursue a combination of weight-bearing exercises and strength training. Patients with severe mobility impairment should be referred for physical therapy. Advised on fall prevention measures including proper lighting in all rooms, removal of area rugs and  floor clutter, use of walking devices as deemed appropriate, avoidance of uneven walking surfaces. Smoking cessation and moderate alcohol consumption if applicable Consume 80410o 1000 IU of vitamin D daily with a goal vitamin D serum value of 30 ng/mL or higher. Aim for 1000 to 1200 mg of elemental calcium daily through supplements and/or dietary sources. Complete 12/2021  Morbid Obesity - BMI 36 with htn, hld, prediabetes, hld Discussed appropriate BMI Goal of losing 1 lb per month. Diet modification. Physical activity. Encouraged/praised to build confidence.    Medication management All medications discussed and reviewed in full. All questions and concerns regarding medications addressed.     Prediabetes/family history diabetes Defers check Education: Reviewed 'ABCs' of diabetes management  Discussed goals to be met and/or maintained include A1C (<7) Blood pressure (<130/80) Cholesterol (LDL <70) Continue Eye Exam yearly  Continue Dental Exam Q6 mo Discussed dietary recommendations Discussed Physical Activity recommendations  Hypertension Discussed DASH (Dietary Approaches to Stop Hypertension) DASH diet is lower in sodium than a typical American diet. Cut back on foods that are high in saturated fat, cholesterol, and trans fats. Eat more whole-grain foods, fish, poultry, and nuts Remain active and exercise as tolerated daily.  Monitor BP at home-Call if greater than 130/80.  Check CMP/CBC   Mantle Cell Lymphoma (HCElmoDr. MoEarlie Serverollowing In observation at this time  Constipation Stay well hydrated Remain active Daily stool softener Continue to monitor Further work up if s/s fail to improve.   Orders Placed This Encounter  Procedures   VITAMIN D 25 Hydroxy (Vit-D Deficiency, Fractures)   Urinalysis, Routine w reflex microscopic   Microalbumin / creatinine urine ratio   TSH   Notify office for further evaluation and treatment, questions or concerns if any  reported s/s fail to improve.  The patient was advised to call back or seek an in-person evaluation if any symptoms worsen or if the condition fails to improve as anticipated.   Further disposition pending results of labs. Discussed med's effects and SE's.    I discussed the assessment and treatment plan with the patient. The patient was provided an opportunity to ask questions and all were answered. The patient agreed with the plan and demonstrated an understanding of the instructions.  Discussed med's effects and SE's. Screening labs and tests as requested with regular follow-up as recommended.  I provided 30 minutes of face-to-face time during this encounter including counseling, chart review, and critical decision making was preformed.  Future Appointments  Date Time Provider [place]  02/05/2022  1:30 PM [facility] LAB [facility] None  02/05/2022  2:00 PM Curt Bears, MD [facility] None  03/01/2022 10:00 AM [facility]G DX DEXA 1 GI-BCGDG GI-BREAST CE  06/14/2022 10:00 AM Darrol Jump, NP [facility] None  12/13/2022  9:00 AM Braelon Sprung, [name], NP [facility] None    HPI  66 y.o. female  presents for a complete physical. 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  She is married, 1 child, no grandchildren. 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 Husband is retired.   She just had lab chole 09/25/2020 by Dr. Kieth Brightly, doing well, some loose stools but manageable.  She has now noticed increase in constipation.  Not going two days.  No recent changes to medication other than adding Temazepam PRN.  She only takes PRN.  She is trying to stay well hydrated.   She is followed by GYN Dr. Selinda Flavin; hx of TAH for endometriosis; She has osteoporosis and is on vivelle dot. Just saw last week. Having a mammogram at breast center due to mild abnormality last year. She has this scheduled for 12/203 including DEXA.  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  She had compleeted 6 cycles of systemic chemotherpy with R-CHOP.  She tolerated her tmt fairly well with no concerning adversee effects but then develooped CHF with drop in EF to 20-25%.  She had a repeat CT scan in 06/2020 which showed evidence for disease proression with mild increase in the and started tmt with acalabrutinib.  She was also seen by Dr. Laverle Hobby at [facility]  07/2020 for discussion of potential autologous stem cell transplant.  She recently had follow up with Dr. Julien Nordmann [facility].  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  She has been in observation since.  She also had an updated PET scan which shoed significant improvement in her disese.  She will continue on observation and have blood work repeated in 3  mo. She was noted to have insomia during this visit and wa provided Restoril 15 mg.  This medication is working well for her.  She is only taking PRN.  BMI is Body mass index is 32.03 kg/m.,  she has been working on diet and exercise. Wt Readings from Last 3 Encounters:  12/12/21 179 lb 6.4 oz (81.4 kg)  11/05/21 177 lb (80.3 kg)  10/03/21 174 lb 6.4 oz (88.5 kg)   Systolic CHF following chemotherapy, Dr.  Missy Sabins follows, on low dose coreg, losartan, spironolactone and PRN torsemide. Chronic exertional dyspnea unchanged. Recent ECHO 05/04/2020 with EF 50-55%.   Her blood pressure has been controlled at home, today their BP is BP: 126/78.   She does not workout. She denies chest pain, dizziness.   She has aortic atherosclerosis per CT 12/2018.   She is not on cholesterol medication (has been off due to ongoing mantle cell lymphoma treatments) and denies myalgias. Her cholesterol is not at goal. The cholesterol last visit was:  Lab Results  Component Value Date   CHOL 250 (H) 09/05/2021   HDL 59 09/05/2021   LDLCALC 155 (H) 09/05/2021   TRIG 197 (H) 09/05/2021   CHOLHDL 4.2 09/05/2021  . She has been working on diet and exercise for hx of prediabetes,  and denies foot ulcerations, hyperglycemia, hypoglycemia , increased appetite, nausea, paresthesia of the feet, polydipsia, polyuria, visual disturbances, vomiting and weight loss. Last A1C in the office was:  Lab Results  Component Value Date   HGBA1C 5.5 09/05/2021   She has CKD III, monitored closely by cardiology and onc.  Lab Results  Component Value Date   GFRNONAA 5 (L) 11/05/2021   Patient is on Vitamin D supplement, 5000 IU daily, hasn't changed since last, agreeable to increasing dose Lab Results  Component Value Date   VD25OH 29 (L) 09/05/2021     She is on thyroid medication. Her medication was not changed last visit.   Lab Results  Component Value Date   TSH 1.882 10/03/2021    Lab Results  Component Value Date   [id number] 12/12/2020      Current Medications:  Current Outpatient Medications on File Prior to Visit  Medication Sig Dispense Refill   amiodarone (PACERONE) 200 MG tablet TAKE 1 TABLET BY MOUTH EVERY DAY (Patient taking differently: Take 200 mg by mouth daily. 1/2 tablet 2 times daily) 90 tablet 3   apixaban (ELIQUIS) 5 MG TABS tablet Take 5 mg by mouth 2 (two) times daily.     cholecalciferol  (VITAMIN D3) 25 MCG (1000 UNIT) tablet Take 1,000 Units by mouth once a week.     estradiol (VIVELLE-DOT) 0.05 MG/24HR patch Place 1 patch onto the skin 2 (two) times a week. Tuesdays & Fridays     FARXIGA 10 MG TABS tablet TAKE 1 TABLET BY MOUTH EVERY DAY 30 tablet 11   ivabradine (CORLANOR) 7.5 MG TABS tablet Take 1 tablet by mouth 2 times daily with a meal. 60 tablet 3   levothyroxine (SYNTHROID) 100 MCG tablet TAKE 1 TABLET BY MOUTH DAILY ON AN EMPTY STOMACH WITH ONLY WATER FOR 30 MINUTES & NO ANTACID/CALCIUM/MAGNESIUM FOR 4 HOURS & AVOID BIOTIN 90 tablet 1   midodrine (PROAMATINE) 5 MG tablet TAKE 1 TABLET (5 MG TOTAL) BY MOUTH 3 (THREE) TIMES DAILY WITH MEALS. 270 tablet 2   ondansetron (ZOFRAN-ODT) 4 MG disintegrating tablet Take 1 tablet (4 mg total) by mouth every 8 (eight) hours as needed for nausea or vomiting. 30 tablet 3   potassium chloride SA (KLOR-CON M) 20 MEQ tablet Take 20 mEq by mouth See admin instructions. Take 1 tablet (20 meq) by mouth every other day (scheduled); may take additional dose daily if needed  for fluid retention.     prochlorperazine (COMPAZINE) 10 MG tablet TAKE 1 TABLET BY MOUTH EVERY 6 HOURS AS NEEDED FOR NAUSEA OR VOMITING. 30 tablet 0   rosuvastatin (CRESTOR) 10 MG tablet Take 1 tablet (10 mg total) by mouth daily. 90 tablet 3   spironolactone (ALDACTONE) 25 MG tablet TAKE 1 TABLET BY MOUTH EVERY DAY (Patient taking differently: 1/2 tablet daily) 90 tablet 3   sulfamethoxazole-trimethoprim (BACTRIM DS) 800-160 MG tablet Take 1 tablet by mouth See admin instructions. Take 1 tablet twice daily on Saturdays and Sundays ONLY     temazepam (RESTORIL) 15 MG capsule Take 1 capsule (15 mg total) by mouth at bedtime as needed for sleep. 30 capsule 0   torsemide (DEMADEX) 20 MG tablet Take 1 tablet by mouth every other day. (Patient taking differently: Take 20 mg by mouth See admin instructions. Take 1 tablet (20 mg) by mouth every other day(scheduled); may take additional  dose daily if needed for fluid retention.) 30 tablet 6   ondansetron (ZOFRAN) 8 MG tablet Take 8 mg by mouth every 8 (eight) hours as needed for vomiting or nausea. (Patient not taking: Reported on 12/12/2021)     valACYclovir (VALTREX) 500 MG tablet Take 500 mg by mouth 2 (two) times daily. (Patient not taking: Reported on 12/12/2021)     No current facility-administered medications on file prior to visit.    Health Maintenance:   Immunization History  Administered Date(s) Administered   Influenza Inj Mdck Quad Pf 10/04/2017   Influenza Split 11/06/2010   Influenza, High Dose Seasonal PF 12/12/2020   Influenza, Seasonal, Injecte, Preservative Fre 01/29/2016   Influenza,inj,Quad PF,6+ Mos 01/27/2019   PNEUMOCOCCAL CONJUGATE-20 12/12/2020   Tdap 07/29/2016   Tetanus: 2018  Influenza Due Prevnar 20 - TODAY  Pneumonia due 1 year Shingles declines Covid 19: has had, req dates  Pap: getting pelvics only, goes to GYN, pelvic, but has had hysterectomy MGM: Due 12/23, at GYN has upcoming scheduled with breast center  DEXA: 2014, had 2018-2019, GYN monitoring Has scheduled 12/2021 Colonoscopy: 03/2018 due 2030 [place] AB 2013  Last Dental Exam: Dr. Laurena Bering, 2023 Last Eye Exam: Dr. Delman Cheadle, 2023 Last derm: Dr. Allyson Sabal, last 2020 - defers at this time.  No new skin issues or complaints.   Patient Care Team: [name], MD as PCP - General (Internal Medicine) Elouise Munroe, MD as PCP - Cardiology (Cardiology) Gatha Mayer, MD as Consulting Physician (Gastroenterology) Paula Compton, MD as Consulting Physician (Obstetrics and Gynecology) Druscilla Brownie, MD as Consulting Physician (Dermatology)   Medical History:  Past Medical History:  Diagnosis Date   Allergy    Cancer [facility])    being worked up for lymphoma   CHF (congestive heart failure) ([place])    Colon polyp 12/29/2006   Dysrhythmia    Frozen shoulder 11/16/2015   Gallstones 08/17/2020   Hyperlipidemia     Hypothyroidism    Obesity    BMI 34   Osteoporosis    Palpitations    Pre-diabetes    Vitamin D deficiency    Allergies Allergies  Allergen Reactions   Adriamycin [Doxorubicin] Other (See Comments)    Acute heart failure (chemo from Jan - May 2021)   Ppd [Tuberculin Purified Protein Derivative] Swelling    Positive reaction.  Negative Chest Xray 06/16/12    SURGICAL HISTORY She  has a past surgical history that includes Abdominal hysterectomy (1997); Cesarean section (1983); Colonoscopy w/ biopsies (12/29/2006); laparoscopy; Upper gastrointestinal endoscopy; IR IMAGING  GUIDED PORT INSERTION (01/15/2019); RIGHT/LEFT HEART CATH AND CORONARY ANGIOGRAPHY (N/A, 06/25/2019); Cholecystectomy (N/A, 09/25/2020); and TEE without cardioversion (N/A, 07/04/2021). FAMILY HISTORY Her family history includes Alcohol abuse in her father; Bone cancer in her maternal aunt; Breast cancer in her maternal aunt; Breast cancer (age of onset: 82) in her niece; Breast cancer (age of onset: 45) in her sister; Cancer (age of onset: 66) in her mother; Diabetes in her brother, mother, paternal grandmother, and sister; Hashimoto's thyroiditis in her sister; Heart disease in her father and maternal aunt; Heart disease (age of onset: 30) in her mother; Hyperlipidemia in her brother and maternal aunt; Hypertension in her brother, father, and mother; Hypothyroidism in her brother and sister; Liver disease in her maternal grandmother and mother; Prostate cancer (age of onset: 89) in her brother; Thyroid disease in her mother. SOCIAL HISTORY She  reports that she quit smoking about 25 years ago. Her smoking use included cigarettes. She has never used smokeless tobacco. She reports that she does not drink alcohol and does not use drugs.  Review of Systems: Review of Systems  Constitutional:  Negative for chills, fever, malaise/fatigue and weight loss.  HENT:  Negative for congestion, ear pain, hearing loss, sore throat and  tinnitus.   Eyes: Negative.  Negative for blurred vision and double vision.  Respiratory:  Negative for cough, sputum production, shortness of breath and wheezing.   Cardiovascular:  Negative for chest pain, palpitations, orthopnea, claudication, leg swelling and PND.  Gastrointestinal:  Negative for abdominal pain, blood in stool, constipation, diarrhea, heartburn, melena, nausea and vomiting.  Genitourinary: Negative.   Musculoskeletal:  Negative for falls, joint pain, myalgias and neck pain.  Skin:  Negative for itching and rash.  Neurological:  Negative for dizziness, tingling, sensory change, loss of consciousness, weakness and headaches.  Endo/Heme/Allergies:  Negative for polydipsia.  Psychiatric/Behavioral: Negative.  Negative for depression, memory loss, substance abuse and suicidal ideas. The patient is not nervous/anxious and does not have insomnia.   All other systems reviewed and are negative.   Physical Exam: Estimated body mass index is 32.03 kg/m as calculated from the following:   Height as of this encounter: 5' 2.75" (1.594 m).   Weight as of this encounter: 179 lb 6.4 oz (81.4 kg). BP 126/78   Pulse 77   Temp (!) 95.7 F (35.4 C)   Ht 5' 2.75" (1.594 m)   Wt 179 lb 6.4 oz (81.4 kg)   SpO2 97%   BMI 32.03 kg/m   General Appearance: Well nourished well developed, in no apparent distress.  Eyes: PERRLA, EOMs, conjunctiva no swelling or erythema ENT/Mouth: Ear canals normal without obstruction, swelling, erythema, or discharge.  TMs normal bilaterally with no erythema, bulging, retraction, or loss of landmark.  Oropharynx moist and clear with no exudate, erythema, or swelling.   Neck: Supple, thyroid normal. No bruits.  No cervical adenopathy Respiratory: Respiratory effort normal, Breath sounds clear A&P without wheeze, rhonchi, rales.   Cardio: RRR without murmurs, rubs or gallops, occ extra beat. Brisk peripheral pulses without edema.  Chest: symmetric, with  normal excursions Breasts: defer to GYN, getting mammograms Abdomen: Morbidly obese, Soft, nontender, no guarding, rebound, hernias, masses, or organomegaly.  Lymphatics: Non tender without lymphadenopathy.  Musculoskeletal: Full ROM all peripheral extremities,5/5 strength, and normal gait.  Skin: Warm, dry without rashes, lesions, ecchymosis. Neuro: Awake and oriented X 3, Cranial nerves intact, reflexes equal bilaterally. Normal muscle tone, no cerebellar symptoms. Sensation intact.  Psych:  normal affect, Insight and  Judgment appropriate.  GU: defer to GYN  EKG: defer - just had 12/04/2020, reviewed  Michele Judy 9:54 AM [facility]

## 2021-12-12 NOTE — Patient Instructions (Signed)

## 2021-12-13 LAB — TSH: TSH: 2.05 mIU/L (ref 0.40–4.50)

## 2021-12-13 LAB — URINALYSIS, ROUTINE W REFLEX MICROSCOPIC
Bilirubin Urine: NEGATIVE
Hgb urine dipstick: NEGATIVE
Ketones, ur: NEGATIVE
Leukocytes,Ua: NEGATIVE
Nitrite: NEGATIVE
Protein, ur: NEGATIVE
Specific Gravity, Urine: 1.011 (ref 1.001–1.035)
pH: 5.5 (ref 5.0–8.0)

## 2021-12-13 LAB — MICROALBUMIN / CREATININE URINE RATIO
Creatinine, Urine: 70 mg/dL (ref 20–275)
Microalb Creat Ratio: 77 mcg/mg creat — ABNORMAL HIGH (ref <30)
Microalb, Ur: 5.4 mg/dL

## 2021-12-13 LAB — VITAMIN D 25 HYDROXY (VIT D DEFICIENCY, FRACTURES): Vit D, 25-Hydroxy: 22 ng/mL — ABNORMAL LOW (ref 30–100)

## 2021-12-27 ENCOUNTER — Other Ambulatory Visit (HOSPITAL_COMMUNITY): Payer: Self-pay | Admitting: Cardiology

## 2021-12-27 ENCOUNTER — Encounter: Payer: Self-pay | Admitting: Internal Medicine

## 2021-12-27 MED ORDER — ROSUVASTATIN CALCIUM 10 MG PO TABS: 10.0000 mg | ORAL_TABLET | Freq: Every day | ORAL | 3 refills | Status: DC

## 2021-12-27 MED ORDER — IVABRADINE HCL 7.5 MG PO TABS: 7.5000 mg | ORAL_TABLET | Freq: Two times a day (BID) | ORAL | 3 refills | Status: DC

## 2022-01-02 ENCOUNTER — Telehealth (HOSPITAL_COMMUNITY): Payer: Self-pay | Admitting: Pharmacist

## 2022-01-02 NOTE — Telephone Encounter (Signed)
Received VM from patient. She states her gynecologist wanted to confirm with her cardiologist that she could continue taking Vivelle dot at the current dose of 0.05 mg twice weekly as patient is on Eliquis for brachial DVT. Discussed with Dr. Haroldine Laws, patient is ok to continue the Vivelle dot at the current dose. Additionally, he is ok with her stopping the Eliquis (after oncology weighs in) as the DVT was provoked and she has been on therapeutic anticoagulation since March 2023. Patient expressed understanding and will discuss with her oncologist at follow up appointment in January. I have informed her gynecologist (Eve Key) of the plan.   Audry Riles, PharmD, BCPS, BCCP, CPP Heart Failure Clinic Pharmacist [phone]

## 2022-01-22 ENCOUNTER — Encounter: Payer: Self-pay | Admitting: Internal Medicine

## 2022-01-30 NOTE — Progress Notes (Unsigned)
THIS ENCOUNTER IS A VIRTUAL VISIT DUE TO Flu - PATIENT WAS NOT SEEN IN THE OFFICE.  PATIENT HAS CONSENTED TO VIRTUAL VISIT / TELEMEDICINE VISIT   Virtual Visit via telephone Note  I connected with  [name] on 01/31/2022 by telephone.  I verified that I am speaking with the correct person using two identifiers.    I discussed the limitations of evaluation and management by telemedicine and the availability of in person appointments. The patient expressed understanding and agreed to proceed.  History of Present Illness:  Ht 5' 2.75" (1.594 m)   BMI 32.03 kg/m  67 y.o. patient contacted office reporting URI sx . she tested positive by test in office parking lot. OV was conducted by telephone to minimize exposure. This patient was vaccinated for flu , last 12/12/21    Sx began 6 days ago with Productive cough of brown mucus, sore throat, congestion, body aches, nausea, diarrhea, fatigue  Treatments tried so far: None  Exposures: Dentist   Medications  Current Outpatient Medications (Endocrine & Metabolic):    estradiol (VIVELLE-DOT) 0.05 MG/24HR patch, Place 1 patch onto the skin 2 (two) times a week. Tuesdays & Fridays   FARXIGA 10 MG TABS tablet, TAKE 1 TABLET BY MOUTH EVERY DAY   levothyroxine (SYNTHROID) 100 MCG tablet, TAKE 1 TABLET BY MOUTH DAILY ON AN EMPTY STOMACH WITH ONLY WATER FOR 30 MINUTES & NO ANTACID/CALCIUM/MAGNESIUM FOR 4 HOURS & AVOID BIOTIN  Current Outpatient Medications (Cardiovascular):    amiodarone (PACERONE) 200 MG tablet, TAKE 1 TABLET BY MOUTH EVERY DAY (Patient taking differently: Take 200 mg by mouth daily. 1/2 tablet 2 times daily)   ivabradine (CORLANOR) 7.5 MG TABS tablet, Take 1 tablet by mouth 2 times daily with a meal.   midodrine (PROAMATINE) 5 MG tablet, TAKE 1 TABLET (5 MG TOTAL) BY MOUTH 3 (THREE) TIMES DAILY WITH MEALS.   rosuvastatin (CRESTOR) 10 MG tablet, Take 1 tablet (10 mg total) by mouth daily.   spironolactone (ALDACTONE) 25 MG  tablet, TAKE 1 TABLET BY MOUTH EVERY DAY (Patient taking differently: 1/2 tablet daily)   torsemide (DEMADEX) 20 MG tablet, Take 1 tablet by mouth every other day. (Patient taking differently: Take 20 mg by mouth See admin instructions. Take 1 tablet (20 mg) by mouth every other day(scheduled); may take additional dose daily if needed for fluid retention.)    Current Outpatient Medications (Hematological):    ELIQUIS 5 MG TABS tablet, TAKE 1 TABLET BY MOUTH TWICE A DAY  Current Outpatient Medications (Other):    cholecalciferol (VITAMIN D3) 25 MCG (1000 UNIT) tablet, Take 1,000 Units by mouth once a week.   ondansetron (ZOFRAN) 8 MG tablet, Take 8 mg by mouth every 8 (eight) hours as needed for vomiting or nausea. (Patient not taking: Reported on 12/12/2021)   ondansetron (ZOFRAN-ODT) 4 MG disintegrating tablet, Take 1 tablet (4 mg total) by mouth every 8 (eight) hours as needed for nausea or vomiting.   potassium chloride SA (KLOR-CON M) 20 MEQ tablet, Take 20 mEq by mouth See admin instructions. Take 1 tablet (20 meq) by mouth every other day (scheduled); may take additional dose daily if needed for fluid retention.   prochlorperazine (COMPAZINE) 10 MG tablet, TAKE 1 TABLET BY MOUTH EVERY 6 HOURS AS NEEDED FOR NAUSEA OR VOMITING.   sulfamethoxazole-trimethoprim (BACTRIM DS) 800-160 MG tablet, Take 1 tablet by mouth See admin instructions. Take 1 tablet twice daily on Saturdays and Sundays ONLY   temazepam (RESTORIL) 15 MG capsule,  Take 1 capsule (15 mg total) by mouth at bedtime as needed for sleep.   valACYclovir (VALTREX) 500 MG tablet, Take 500 mg by mouth 2 (two) times daily. (Patient not taking: Reported on 12/12/2021)  Allergies:  Allergies  Allergen Reactions   Adriamycin [Doxorubicin] Other (See Comments)    Acute heart failure (chemo from Jan - May 2021)   Ppd [Tuberculin Purified Protein Derivative] Swelling    Positive reaction.  Negative Chest Xray 06/16/12    Problem list She  has Hyperlipidemia; Vitamin D deficiency; Hypothyroidism; Morbid obesity ([place]) BMI 35+ with prediabetes, hyperlipidemia, htn; Osteoporosis; Prediabetes; Labile hypertension; Aortic atherosclerosis ([place]) - per CT 12/22/2018; Mantle cell lymphoma ([place]); Goals of care, counseling/discussion; Encounter for antineoplastic chemotherapy; Port-A-Cath in place; Sinus tachycardia; Systolic CHF ([place]); L89 deficiency; Deep vein thrombosis (DVT) of brachial vein of left upper extremity ([place]); and PVC's (premature ventricular contractions) on their problem list.   Social History:   reports that she quit smoking about 26 years ago. Her smoking use included cigarettes. She has never used smokeless tobacco. She reports that she does not drink alcohol and does not use drugs.  Observations/Objective:  General : Well sounding patient in no apparent distress HEENT: no hoarseness, no cough for duration of visit Lungs: speaks in complete sentences, no audible wheezing, no apparent distress Neurological: alert, oriented x 3 Psychiatric: pleasant, judgement appropriate   Assessment and Plan: FLU Flu positive per rapid screening test in office parking lot Risk factors include: has Hyperlipidemia; Vitamin D deficiency; Hypothyroidism; Morbid obesity ([place]) BMI 35+ with prediabetes, hyperlipidemia, htn; Osteoporosis; Prediabetes; Labile hypertension; Aortic atherosclerosis ([place]) - per CT 12/22/2018; Mantle cell lymphoma ([place]); Goals of care, counseling/discussion; Encounter for antineoplastic chemotherapy; Port-A-Cath in place; Sinus tachycardia; Systolic CHF ([place]); Q11 deficiency; Deep vein thrombosis (DVT) of brachial vein of left upper extremity ([facility]); and PVC's (premature ventricular contractions) on their problem list.  Symptoms are: moderate- has developed productive cough of brown mucus, reluctant to add steroids due to mantle cell lymphoma Immue support reviewed Vit D, Vit C, Zinc Take tylenol PRN temp 101+ Push  hydration Sx supportive therapy suggested Follow up via mychart or telephone if needed  Loney was seen today for acute visit.  Diagnoses and all orders for this visit:  Flu-like symptoms -     POCT Influenza A/B- positive flu A  Encounter for screening for COVID-19 -     POC COVID-19- negative  Flu Push fluids Use vit C, Vit D and zinc to boost immune system If not improved in next 72 hours notify the office Holding off on Steroids due to mantle cell lymphoma -     promethazine-dextromethorphan (PROMETHAZINE-DM) 6.25-15 MG/5ML syrup; Take 5 mLs by mouth 4 (four) times daily as needed for cough.  URI, acute -     promethazine-dextromethorphan (PROMETHAZINE-DM) 6.25-15 MG/5ML syrup; Take 5 mLs by mouth 4 (four) times daily as needed for cough. -     azithromycin (ZITHROMAX) 250 MG tablet; Take 2 tablets (500 mg) on  Day 1,  followed by 1 tablet (250 mg) once daily on Days 2 through 5.     Follow Up Instructions:  I discussed the assessment and treatment plan with the patient. The patient was provided an opportunity to ask questions and all were answered. The patient agreed with the plan and demonstrated an understanding of the instructions.   The patient was advised to call back or seek an in-person evaluation if the symptoms worsen or if the condition fails to  improve as anticipated.  I provided 20 minutes of non-face-to-face time during this encounter.   Alycia Rossetti, NP

## 2022-01-31 ENCOUNTER — Telehealth: Payer: Self-pay | Admitting: Internal Medicine

## 2022-01-31 ENCOUNTER — Ambulatory Visit: Payer: Medicare Other | Admitting: Nurse Practitioner

## 2022-01-31 ENCOUNTER — Encounter: Payer: Self-pay | Admitting: Nurse Practitioner

## 2022-01-31 ENCOUNTER — Other Ambulatory Visit: Payer: Self-pay

## 2022-01-31 VITALS — HR 73 | Temp 100.0°F | Ht 62.75 in

## 2022-01-31 DIAGNOSIS — R6889 Other general symptoms and signs: Secondary | ICD-10-CM

## 2022-01-31 DIAGNOSIS — J069 Acute upper respiratory infection, unspecified: Secondary | ICD-10-CM | POA: Diagnosis not present

## 2022-01-31 DIAGNOSIS — Z1152 Encounter for screening for COVID-19: Secondary | ICD-10-CM | POA: Diagnosis not present

## 2022-01-31 DIAGNOSIS — J111 Influenza due to unidentified influenza virus with other respiratory manifestations: Secondary | ICD-10-CM | POA: Diagnosis not present

## 2022-01-31 LAB — POCT INFLUENZA A/B
Influenza A, POC: POSITIVE — AB
Influenza B, POC: NEGATIVE

## 2022-01-31 LAB — POC COVID19 BINAXNOW: SARS Coronavirus 2 Ag: NEGATIVE

## 2022-01-31 MED ORDER — AZITHROMYCIN 250 MG PO TABS: ORAL_TABLET | ORAL | 1 refills | Status: DC

## 2022-01-31 MED ORDER — PROMETHAZINE-DM 6.25-15 MG/5ML PO SYRP: 5.0000 mL | ORAL_SOLUTION | Freq: Four times a day (QID) | ORAL | 1 refills | Status: DC | PRN

## 2022-01-31 NOTE — Patient Instructions (Signed)

## 2022-01-31 NOTE — Telephone Encounter (Signed)
Called patient regarding upcoming January appointments, patient is notified.

## 2022-02-05 ENCOUNTER — Inpatient Hospital Stay: Payer: Medicare Other

## 2022-02-05 ENCOUNTER — Other Ambulatory Visit: Payer: Self-pay

## 2022-02-05 ENCOUNTER — Inpatient Hospital Stay: Payer: Medicare Other | Attending: Internal Medicine

## 2022-02-05 ENCOUNTER — Inpatient Hospital Stay (HOSPITAL_BASED_OUTPATIENT_CLINIC_OR_DEPARTMENT_OTHER): Payer: Medicare Other | Admitting: Internal Medicine

## 2022-02-05 VITALS — BP 108/58 | HR 64 | Temp 97.7°F | Resp 17 | Ht 62.75 in | Wt 178.0 lb

## 2022-02-05 DIAGNOSIS — R5383 Other fatigue: Secondary | ICD-10-CM | POA: Insufficient documentation

## 2022-02-05 DIAGNOSIS — Z95828 Presence of other vascular implants and grafts: Secondary | ICD-10-CM

## 2022-02-05 DIAGNOSIS — C8310 Mantle cell lymphoma, unspecified site: Secondary | ICD-10-CM | POA: Diagnosis present

## 2022-02-05 DIAGNOSIS — C8313 Mantle cell lymphoma, intra-abdominal lymph nodes: Secondary | ICD-10-CM | POA: Diagnosis not present

## 2022-02-05 DIAGNOSIS — D72819 Decreased white blood cell count, unspecified: Secondary | ICD-10-CM | POA: Diagnosis not present

## 2022-02-05 LAB — CMP (CANCER CENTER ONLY)
ALT: 19 U/L (ref 0–44)
AST: 19 U/L (ref 15–41)
Albumin: 3.6 g/dL (ref 3.5–5.0)
Alkaline Phosphatase: 85 U/L (ref 38–126)
Anion gap: 7 (ref 5–15)
BUN: 12 mg/dL (ref 8–23)
CO2: 29 mmol/L (ref 22–32)
Calcium: 8.8 mg/dL — ABNORMAL LOW (ref 8.9–10.3)
Chloride: 102 mmol/L (ref 98–111)
Creatinine: 0.99 mg/dL (ref 0.44–1.00)
GFR, Estimated: >60 mL/min (ref >60)
Glucose, Bld: 76 mg/dL (ref 70–99)
Potassium: 3.8 mmol/L (ref 3.5–5.1)
Sodium: 138 mmol/L (ref 135–145)
Total Bilirubin: 0.4 mg/dL (ref 0.3–1.2)
Total Protein: 6.2 g/dL — ABNORMAL LOW (ref 6.5–8.1)

## 2022-02-05 LAB — CBC WITH DIFFERENTIAL (CANCER CENTER ONLY)
Abs Immature Granulocytes: 0.36 10*3/uL — ABNORMAL HIGH (ref 0.00–0.07)
Basophils Absolute: 0 10*3/uL (ref 0.0–0.1)
Basophils Relative: 1 %
Eosinophils Absolute: 0 10*3/uL (ref 0.0–0.5)
Eosinophils Relative: 1 %
HCT: 40.5 % (ref 36.0–46.0)
Hemoglobin: 13.6 g/dL (ref 12.0–15.0)
Immature Granulocytes: 12 %
Lymphocytes Relative: 8 %
Lymphs Abs: 0.2 10*3/uL — ABNORMAL LOW (ref 0.7–4.0)
MCH: 30.4 pg (ref 26.0–34.0)
MCHC: 33.6 g/dL (ref 30.0–36.0)
MCV: 90.6 fL (ref 80.0–100.0)
Monocytes Absolute: 0.4 10*3/uL (ref 0.1–1.0)
Monocytes Relative: 14 %
Neutro Abs: 1.9 10*3/uL (ref 1.7–7.7)
Neutrophils Relative %: 64 %
Platelet Count: 341 10*3/uL (ref 150–400)
RBC: 4.47 MIL/uL (ref 3.87–5.11)
RDW: 15 % (ref 11.5–15.5)
WBC Count: 2.9 10*3/uL — ABNORMAL LOW (ref 4.0–10.5)
nRBC: 0 % (ref 0.0–0.2)

## 2022-02-05 LAB — LACTATE DEHYDROGENASE: LDH: 257 U/L — ABNORMAL HIGH (ref 98–192)

## 2022-02-05 MED ORDER — SODIUM CHLORIDE 0.9% FLUSH
10.0000 mL | INTRAVENOUS | Status: DC | PRN
  Administered 2022-02-05: 10 mL

## 2022-02-05 MED ORDER — HEPARIN SOD (PORK) LOCK FLUSH 100 UNIT/ML IV SOLN
500.0000 [IU] | Freq: Once | INTRAVENOUS | Status: AC | PRN
  Administered 2022-02-05: 500 [IU]

## 2022-02-05 NOTE — Progress Notes (Signed)
[facility] Telephone:([phone]   Fax:([phone]  PROGRESS NOTE FOR TELEMEDICINE VISITS  [name], MD [address] [place] [address]  I connected withNAME@ on 02/05/22 at  2:00 PM EST by video enabled telemedicine visit and verified that I am speaking with the correct person using two identifiers.   I discussed the limitations, risks, security and privacy concerns of performing an evaluation and management service by telemedicine and the availability of in-person appointments. I also discussed with the patient that there may be a patient responsible charge related to this service. The patient expressed understanding and agreed to proceed.  Other persons participating in the visit and their role in the encounter:  None  Patient's location: [facility] exam room Provider's location:  [facility] office  DIAGNOSIS: Stage IV non-Hodgkin's lymphoma, mantle cell lymphoma.  She presented with left iliac and retroperitoneal lymphadenopathy.  She also presented with left axillary lymphadenopathy and bone marrow involvement.  She was diagnosed in December 2020.   PRIOR THERAPY:  1) Systemic chemotherapy with R-CHOP with Neulasta support.  First dose on 01/27/2019. Status post 6 cycles.   2) Acalabrutinib 100 mg p.o. twice daily.  First dose started June 17, 2020.  Status post 3 months of treatment. 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 female returns to the clinic today for follow-up visit.  The patient is feeling fine today with no concerning complaints except for recent chest congestion  from flulike symptoms but she is recovering slowly.  She denied having any current chest pain, shortness of breath, cough or hemoptysis.  She has no nausea, vomiting, diarrhea or constipation.  She has no headache or visual changes.  She has no palpable lymphadenopathy.  She has no recent weight loss or night sweats.  The patient is here today for evaluation and repeat blood work.  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  MEDICATIONS:  Current Outpatient Medications  Medication Sig Dispense Refill   amiodarone (PACERONE) 200 MG tablet TAKE 1 TABLET BY MOUTH EVERY DAY (Patient taking differently: Take 200 mg by mouth daily. 1/2 tablet 2 times daily) 90 tablet 3   azithromycin (ZITHROMAX) 250 MG tablet Take 2 tablets (500 mg) on  Day 1,  followed by 1 tablet (250 mg) once daily on Days 2 through 5. 6 each 1   cholecalciferol (VITAMIN D3) 25 MCG (1000 UNIT) tablet Take 1,000 Units by mouth once a week.     ELIQUIS 5 MG TABS tablet TAKE 1 TABLET BY MOUTH TWICE A DAY 60 tablet 6   estradiol (VIVELLE-DOT) 0.05 MG/24HR patch Place 1 patch onto the  skin 2 (two) times a week. Tuesdays & Fridays     FARXIGA 10 MG TABS tablet TAKE 1 TABLET BY MOUTH EVERY DAY 30 tablet 11   Ibuprofen (MOTRIN PO) Take by mouth.     ivabradine (CORLANOR) 7.5 MG TABS tablet Take 1 tablet by mouth 2 times daily with a meal. 60 tablet 3   levothyroxine (SYNTHROID) 100 MCG tablet TAKE 1 TABLET BY MOUTH DAILY ON AN EMPTY STOMACH WITH ONLY WATER FOR 30 MINUTES & NO  ANTACID/CALCIUM/MAGNESIUM FOR 4 HOURS & AVOID BIOTIN 90 tablet 1   midodrine (PROAMATINE) 5 MG tablet TAKE 1 TABLET (5 MG TOTAL) BY MOUTH 3 (THREE) TIMES DAILY WITH MEALS. (Patient not taking: Reported on 01/31/2022) 270 tablet 2   ondansetron (ZOFRAN) 8 MG tablet Take 8 mg by mouth every 8 (eight) hours as needed for vomiting or nausea. (Patient not taking: Reported on 12/12/2021)     ondansetron (ZOFRAN-ODT) 4 MG disintegrating tablet Take 1 tablet (4 mg total) by mouth every 8 (eight) hours as needed for nausea or vomiting. (Patient not taking: Reported on 01/31/2022) 30 tablet 3   potassium chloride SA (KLOR-CON M) 20 MEQ tablet Take 20 mEq by mouth See admin instructions. Take 1 tablet (20 meq) by mouth every other day (scheduled); may take additional dose daily if needed for fluid retention.     prochlorperazine (COMPAZINE) 10 MG tablet TAKE 1 TABLET BY MOUTH EVERY 6 HOURS AS NEEDED FOR NAUSEA OR VOMITING. (Patient not taking: Reported on 01/31/2022) 30 tablet 0   promethazine-dextromethorphan (PROMETHAZINE-DM) 6.25-15 MG/5ML syrup Take 5 mLs by mouth 4 (four) times daily as needed for cough. 240 mL 1   rosuvastatin (CRESTOR) 10 MG tablet Take 1 tablet (10 mg total) by mouth daily. (Patient not taking: Reported on 01/31/2022) 90 tablet 3   spironolactone (ALDACTONE) 25 MG tablet TAKE 1 TABLET BY MOUTH EVERY DAY (Patient taking differently: 1/2 tablet daily) 90 tablet 3   sulfamethoxazole-trimethoprim (BACTRIM DS) 800-160 MG tablet Take 1 tablet by mouth See admin instructions. Take 1 tablet twice daily on Saturdays and 'Sundays ONLY     temazepam (RESTORIL) 15 MG capsule Take 1 capsule (15 mg total) by mouth at bedtime as needed for sleep. (Patient not taking: Reported on 01/31/2022) 30 capsule 0   torsemide (DEMADEX) 20 MG tablet Take 1 tablet by mouth every other day. (Patient taking differently: Take 20 mg by mouth See admin instructions. Take 1 tablet (20 mg) by mouth every other day(scheduled); may  take additional dose daily if needed for fluid retention.) 30 tablet 6   valACYclovir (VALTREX) 500 MG tablet Take 500 mg by mouth 2 (two) times daily. (Patient not taking: Reported on 12/12/2021)     No current facility-administered medications for this visit.   Facility-Administered Medications Ordered in Other Visits  Medication Dose Route Frequency Provider Last Rate Last Admin   sodium chloride flush (NS) 0.9 % injection 10 mL  10 mL Intracatheter PRN Jodel Mayhall, MD   10 mL at 02/05/22 1401    SURGICAL HISTORY:  Past Surgical History:  Procedure Laterality Date   ABDOMINAL HYSTERECTOMY  1997   CESAREAN SECTION  1983   CHOLECYSTECTOMY N/A 09/25/2020   Procedure: LAPAROSCOPIC CHOLECYSTECTOMY;  Surgeon: Kinsinger, Luke Aaron, MD;  Location: [facility];  Service: General;  Laterality: N/A;  60 MINUTES   COLONOSCOPY W/ BIOPSIES  12/29/2006   IR IMAGING GUIDED PORT INSERTION  01/15/2019   LAPAROSCOPY     19'$ 91 for emdometriosis  RIGHT/LEFT HEART CATH AND CORONARY ANGIOGRAPHY N/A 06/25/2019   Procedure: RIGHT/LEFT HEART CATH AND CORONARY ANGIOGRAPHY;  Surgeon: Jolaine Artist, MD;  Location: [facility];  Service: Cardiovascular;  Laterality: N/A;   TEE WITHOUT CARDIOVERSION N/A 07/04/2021   Procedure: TRANSESOPHAGEAL ECHOCARDIOGRAM (TEE);  Surgeon: Jolaine Artist, MD;  Location: [facility];  Service: Cardiovascular;  Laterality: N/A;   UPPER GASTROINTESTINAL ENDOSCOPY      REVIEW OF SYSTEMS:  A comprehensive review of systems was negative except for: Constitutional: positive for fatigue Ears, nose, mouth, throat, and face: positive for nasal congestion   ECOG PERFORMANCE STATUS: 1 - Symptomatic but completely ambulatory  Blood pressure (!) 108/58, pulse 64, temperature 97.7 F (36.5 C), temperature source Temporal, resp. rate 17, height 5' 2.75" (1.594 m), weight 178 lb (80.7 kg), SpO2 97 %.  LABORATORY DATA: Lab Results  Component Value Date   WBC 3.4 (L)  11/05/2021   HGB 13.5 11/05/2021   HCT 40.9 11/05/2021   MCV 91.1 11/05/2021   PLT 234 11/05/2021      Chemistry      Component Value Date/Time   NA 138 11/05/2021 1451   K 3.9 11/05/2021 1451   CL 102 11/05/2021 1451   CO2 27 11/05/2021 1451   BUN 20 11/05/2021 1451   CREATININE 1.30 (H) 11/05/2021 1451   CREATININE 1.03 12/12/2020 0957      Component Value Date/Time   CALCIUM 9.1 11/05/2021 1451   ALKPHOS 87 11/05/2021 1451   AST 25 11/05/2021 1451   ALT 21 11/05/2021 1451   BILITOT 0.4 11/05/2021 1451       RADIOGRAPHIC STUDIES: No results found.  ASSESSMENT AND PLAN:  This is a very pleasant 67 years old white female recently diagnosed with stage IV less aggressive mantle cell lymphoma presented with left iliac and retroperitoneal lymphadenopathy as well as left axillary and bone marrow involvement in December 2020. The patient completed 6 cycles of systemic chemotherapy with R-CHOP.  She tolerated her treatment fairly well with no concerning adverse effects but few months later she developed congestive heart failure with drop in her ejection fraction to the 20-25%.  This has improved over the last few months and currently around 55%.  She is followed by Dr. Haroldine Laws. Unfortunately repeat CT scan in June 2022 showed evidence for disease progression with mild increase in the small bilateral axillary lymph nodes and moderate increase in the left supraclavicular as well as ileocolic mesenteric lymph nodes and left external iliac lymph nodes but mild increase in the periaortic retroperitoneal lymph nodes. The patient is started treatment with acalabrutinib 100 mg p.o. twice daily on June 17, 2020  She was seen by Dr. Laverle Hobby at [facility] on July 12, 2020 for discussion of potential autologous stem cell transplant.   The patient had repeat CT scan of the chest, abdomen pelvis performed recently that showed decrease in the size of mesenteric lymph node/mass of the  right hemiabdomen, left external iliac lymph nodes and lymph nodes adjacent to the pancreatic tail/splenic hilum but there was mild increase in the size of the mediastinal paraesophageal nodes and retroperitoneal periaortic lymph node as well as increase in the size of the spleen. 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 She is currently on observation and feeling fine with no concerning complaints except  for mild fatigue. Repeat CBC today showed mild leukocytopenia with total white blood count of 2.9.  Comprehensive metabolic panel is unremarkable except for mild hypocalcemia. I recommended for the patient to continue on observation with repeat blood work in 3 months. She was advised to call immediately if she has any other concerning symptoms in the interval.   I discussed the assessment and treatment plan with the patient. The patient was provided an opportunity to ask questions and all were answered. The patient agreed with the plan and demonstrated an understanding of the instructions.   The patient was advised to call back or seek an in-person evaluation if the symptoms worsen or if the condition fails to improve as anticipated.  I provided 20 minutes of face-to-face video visit time during this encounter, and > 50% was spent counseling as documented under my assessment & plan.  Eilleen Kempf, MD 02/05/2022 2:17 PM  Disclaimer: This note was dictated with voice recognition software. Similar sounding words can inadvertently be transcribed and may not be corrected upon review.

## 2022-03-01 ENCOUNTER — Ambulatory Visit
Admission: RE | Admit: 2022-03-01 | Discharge: 2022-03-01 | Disposition: A | Discharge status: Home / Self Care | Payer: Medicare Other | Source: Ambulatory Visit | Attending: Nurse Practitioner | Admitting: Nurse Practitioner

## 2022-03-01 DIAGNOSIS — M810 Age-related osteoporosis without current pathological fracture: Secondary | ICD-10-CM

## 2022-03-06 ENCOUNTER — Other Ambulatory Visit: Payer: Self-pay | Admitting: Nurse Practitioner

## 2022-03-06 DIAGNOSIS — E039 Hypothyroidism, unspecified: Secondary | ICD-10-CM

## 2022-03-07 ENCOUNTER — Other Ambulatory Visit (HOSPITAL_COMMUNITY): Payer: Self-pay

## 2022-03-07 ENCOUNTER — Encounter (HOSPITAL_COMMUNITY): Payer: Self-pay | Admitting: Pharmacy Technician

## 2022-03-07 ENCOUNTER — Telehealth (HOSPITAL_COMMUNITY): Payer: Self-pay | Admitting: Pharmacy Technician

## 2022-03-07 NOTE — Telephone Encounter (Signed)
Advanced Heart Failure Patient Advocate Encounter  The patient was approved for a [employer] grant that will help cover the cost of [place], [place]. Total amount awarded, $10,000. Eligibility, 02/17/22 - 02/17/23.  ID [id number]  BIN [id number]  PCN [id number]  Group [id number]  Called and left the patient a message. Will send approval information to patient's mychart. She will receive an approval letter along with pharmacy card in the mail as well.   Charlann Boxer, CPhT

## 2022-03-21 ENCOUNTER — Telehealth: Payer: Self-pay | Admitting: Medical Oncology

## 2022-03-21 ENCOUNTER — Telehealth: Payer: Self-pay | Admitting: Internal Medicine

## 2022-03-21 ENCOUNTER — Other Ambulatory Visit: Payer: Self-pay | Admitting: Medical Oncology

## 2022-03-21 DIAGNOSIS — C8313 Mantle cell lymphoma, intra-abdominal lymph nodes: Secondary | ICD-10-CM

## 2022-03-21 NOTE — Telephone Encounter (Signed)
Requested to please obtain a CD4 count . The pt needs refills for Bactrim and Valtrex and needs this result to check her immune function.  Fax to " Suanne Marker " '@[facility]'$  [phone].  Schedule message sent.

## 2022-03-21 NOTE — Telephone Encounter (Signed)
Scheduled appointment per 3/14 scheduling message. Patient is aware of the made appointment.

## 2022-03-22 ENCOUNTER — Telehealth: Payer: Self-pay | Admitting: Medical Oncology

## 2022-03-22 ENCOUNTER — Inpatient Hospital Stay: Payer: Medicare Other | Attending: Internal Medicine

## 2022-03-22 ENCOUNTER — Other Ambulatory Visit: Payer: Self-pay

## 2022-03-22 DIAGNOSIS — C8310 Mantle cell lymphoma, unspecified site: Secondary | ICD-10-CM | POA: Insufficient documentation

## 2022-03-22 DIAGNOSIS — Z95828 Presence of other vascular implants and grafts: Secondary | ICD-10-CM

## 2022-03-22 DIAGNOSIS — Z452 Encounter for adjustment and management of vascular access device: Secondary | ICD-10-CM | POA: Insufficient documentation

## 2022-03-22 DIAGNOSIS — C8313 Mantle cell lymphoma, intra-abdominal lymph nodes: Secondary | ICD-10-CM

## 2022-03-22 LAB — CMP (CANCER CENTER ONLY)
ALT: 19 U/L (ref 0–44)
AST: 22 U/L (ref 15–41)
Albumin: 4.3 g/dL (ref 3.5–5.0)
Alkaline Phosphatase: 76 U/L (ref 38–126)
Anion gap: 5 (ref 5–15)
BUN: 20 mg/dL (ref 8–23)
CO2: 31 mmol/L (ref 22–32)
Calcium: 9.5 mg/dL (ref 8.9–10.3)
Chloride: 103 mmol/L (ref 98–111)
Creatinine: 1 mg/dL (ref 0.44–1.00)
GFR, Estimated: >60 mL/min (ref >60)
Glucose, Bld: 102 mg/dL — ABNORMAL HIGH (ref 70–99)
Potassium: 4.1 mmol/L (ref 3.5–5.1)
Sodium: 139 mmol/L (ref 135–145)
Total Bilirubin: 0.5 mg/dL (ref 0.3–1.2)
Total Protein: 6.6 g/dL (ref 6.5–8.1)

## 2022-03-22 LAB — CBC WITH DIFFERENTIAL (CANCER CENTER ONLY)
Abs Immature Granulocytes: 0.3 10*3/uL — ABNORMAL HIGH (ref 0.00–0.07)
Basophils Absolute: 0 10*3/uL (ref 0.0–0.1)
Basophils Relative: 1 %
Eosinophils Absolute: 0 10*3/uL (ref 0.0–0.5)
Eosinophils Relative: 1 %
HCT: 41.8 % (ref 36.0–46.0)
Hemoglobin: 13.9 g/dL (ref 12.0–15.0)
Immature Granulocytes: 8 %
Lymphocytes Relative: 9 %
Lymphs Abs: 0.3 10*3/uL — ABNORMAL LOW (ref 0.7–4.0)
MCH: 31.1 pg (ref 26.0–34.0)
MCHC: 33.3 g/dL (ref 30.0–36.0)
MCV: 93.5 fL (ref 80.0–100.0)
Monocytes Absolute: 0.5 10*3/uL (ref 0.1–1.0)
Monocytes Relative: 14 %
Neutro Abs: 2.6 10*3/uL (ref 1.7–7.7)
Neutrophils Relative %: 67 %
Platelet Count: 244 10*3/uL (ref 150–400)
RBC: 4.47 MIL/uL (ref 3.87–5.11)
RDW: 15.6 % — ABNORMAL HIGH (ref 11.5–15.5)
WBC Count: 3.8 10*3/uL — ABNORMAL LOW (ref 4.0–10.5)
nRBC: 0 % (ref 0.0–0.2)

## 2022-03-22 LAB — LACTATE DEHYDROGENASE: LDH: 205 U/L — ABNORMAL HIGH (ref 98–192)

## 2022-03-22 LAB — T-HELPER CELLS (CD4) COUNT (NOT AT ARMC)
CD4 % Helper T Cell: 18 % — ABNORMAL LOW (ref 33–65)
CD4 T Cell Abs: 83 /uL — ABNORMAL LOW (ref 400–1790)

## 2022-03-22 MED ORDER — SODIUM CHLORIDE 0.9% FLUSH
10.0000 mL | INTRAVENOUS | Status: DC | PRN
  Administered 2022-03-22: 10 mL

## 2022-03-22 MED ORDER — HEPARIN SOD (PORK) LOCK FLUSH 100 UNIT/ML IV SOLN
500.0000 [IU] | Freq: Once | INTRAVENOUS | Status: AC | PRN
  Administered 2022-03-22: 500 [IU]

## 2022-03-22 NOTE — Telephone Encounter (Signed)
Faxed CD4 and other labs to [facility].

## 2022-03-26 ENCOUNTER — Telehealth (HOSPITAL_COMMUNITY): Payer: Self-pay | Admitting: Cardiology

## 2022-03-26 DIAGNOSIS — I5022 Chronic systolic (congestive) heart failure: Secondary | ICD-10-CM

## 2022-03-26 MED ORDER — TORSEMIDE 20 MG PO TABS
20.0000 mg | ORAL_TABLET | Freq: Every day | ORAL | 6 refills | Status: DC
  Filled 2022-03-26 – 2022-10-04 (×2): qty 30, 30d supply, fill #0

## 2022-03-26 MED ORDER — POTASSIUM CHLORIDE CRYS ER 20 MEQ PO TBCR
20.0000 meq | EXTENDED_RELEASE_TABLET | Freq: Every day | ORAL | 6 refills | Status: DC
  Filled 2022-03-26: qty 30, 30d supply, fill #0

## 2022-03-26 NOTE — Telephone Encounter (Signed)
PT AWARE AND VOICED UNDERSTANDING LABS 3/27

## 2022-03-26 NOTE — Telephone Encounter (Signed)
Patient called to report her lips were blue (corner of her lip) while doing home chores Reports she was on the phone with her daughter and was advised to check o2 sats with apple watch At this time sats were 86% on room air-rested and rechecked 96%  Pt reports she has rechecked multiple times since initial incident and sats have not been below 96%  Reports she has noted some weight increase ~9 lbs in 2 months and minimal swelling however never c/o increase in SOB    Please advise

## 2022-03-27 ENCOUNTER — Other Ambulatory Visit (HOSPITAL_COMMUNITY): Payer: Self-pay

## 2022-03-27 ENCOUNTER — Other Ambulatory Visit: Payer: Self-pay

## 2022-03-28 ENCOUNTER — Other Ambulatory Visit (HOSPITAL_COMMUNITY): Payer: Self-pay

## 2022-03-28 ENCOUNTER — Encounter (HOSPITAL_COMMUNITY): Payer: Self-pay

## 2022-04-02 ENCOUNTER — Other Ambulatory Visit: Payer: Self-pay

## 2022-04-03 ENCOUNTER — Other Ambulatory Visit: Payer: Self-pay

## 2022-04-03 ENCOUNTER — Other Ambulatory Visit (HOSPITAL_COMMUNITY): Payer: Medicare Other

## 2022-04-03 ENCOUNTER — Telehealth: Payer: Self-pay | Admitting: Internal Medicine

## 2022-04-03 NOTE — Telephone Encounter (Signed)
Called patient regarding April appointments, patient is notified. 

## 2022-04-05 ENCOUNTER — Other Ambulatory Visit: Payer: Self-pay

## 2022-04-09 ENCOUNTER — Ambulatory Visit (HOSPITAL_COMMUNITY)
Admission: RE | Admit: 2022-04-09 | Discharge: 2022-04-09 | Disposition: A | Discharge status: Home / Self Care | Payer: Medicare Other | Source: Ambulatory Visit | Attending: Cardiology | Admitting: Cardiology

## 2022-04-09 DIAGNOSIS — I5022 Chronic systolic (congestive) heart failure: Secondary | ICD-10-CM | POA: Insufficient documentation

## 2022-04-09 LAB — BASIC METABOLIC PANEL
Anion gap: 10 (ref 5–15)
BUN: 19 mg/dL (ref 8–23)
CO2: 27 mmol/L (ref 22–32)
Calcium: 9.5 mg/dL (ref 8.9–10.3)
Chloride: 100 mmol/L (ref 98–111)
Creatinine, Ser: 1.55 mg/dL — ABNORMAL HIGH (ref 0.44–1.00)
GFR, Estimated: 37 mL/min — ABNORMAL LOW (ref >60)
Glucose, Bld: 85 mg/dL (ref 70–99)
Potassium: 4.4 mmol/L (ref 3.5–5.1)
Sodium: 137 mmol/L (ref 135–145)

## 2022-04-10 ENCOUNTER — Telehealth (HOSPITAL_COMMUNITY): Payer: Self-pay

## 2022-04-10 DIAGNOSIS — I5022 Chronic systolic (congestive) heart failure: Secondary | ICD-10-CM

## 2022-04-10 NOTE — Telephone Encounter (Signed)
Called patient and she wants to repeat her labs at her up coming appointment, labs order. Pt aware, agreeable, and verbalized understanding

## 2022-04-10 NOTE — Telephone Encounter (Signed)
-----   Message from Rafael Bihari, [place] sent at 04/09/2022  4:00 PM EDT ----- Kidney function elevated.  Repeat BMET in 10-14 days to follow.

## 2022-04-15 ENCOUNTER — Other Ambulatory Visit: Payer: Self-pay

## 2022-04-15 ENCOUNTER — Ambulatory Visit (INDEPENDENT_AMBULATORY_CARE_PROVIDER_SITE_OTHER): Payer: Medicare Other | Admitting: Nurse Practitioner

## 2022-04-15 ENCOUNTER — Encounter: Payer: Self-pay | Admitting: Nurse Practitioner

## 2022-04-15 VITALS — BP 108/70 | HR 75 | Temp 97.5°F | Ht 62.75 in | Wt 178.6 lb

## 2022-04-15 DIAGNOSIS — J302 Other seasonal allergic rhinitis: Secondary | ICD-10-CM

## 2022-04-15 DIAGNOSIS — Z1152 Encounter for screening for COVID-19: Secondary | ICD-10-CM

## 2022-04-15 DIAGNOSIS — I5022 Chronic systolic (congestive) heart failure: Secondary | ICD-10-CM

## 2022-04-15 DIAGNOSIS — R6889 Other general symptoms and signs: Secondary | ICD-10-CM | POA: Diagnosis not present

## 2022-04-15 LAB — POCT INFLUENZA A/B
Influenza A, POC: NEGATIVE
Influenza B, POC: NEGATIVE

## 2022-04-15 LAB — POC COVID19 BINAXNOW: SARS Coronavirus 2 Ag: NEGATIVE

## 2022-04-15 MED ORDER — FLUTICASONE PROPIONATE 50 MCG/ACT NA SUSP
NASAL | 2 refills | Status: DC
Start: 2022-04-15 — End: 2022-04-25

## 2022-04-15 MED ORDER — IPRATROPIUM BROMIDE 0.03 % NA SOLN
2.0000 | Freq: Four times a day (QID) | NASAL | 2 refills | Status: DC
Start: 2022-04-15 — End: 2022-04-25

## 2022-04-15 NOTE — Patient Instructions (Addendum)
Atrovent nasal spray every 6 hours as needed to dry up mucus  Flonase 1 spray each nostril daily   Allergic Rhinitis, Adult  Allergic rhinitis is a reaction to allergens. Allergens are things that can cause an allergic reaction. This condition affects the lining inside the nose (mucous membrane). There are two types of allergic rhinitis: Seasonal. This type is also called hay fever. It happens only during some times of the year. Perennial. This type can happen at any time of the year. This condition cannot be spread from person to person (is not contagious). It can be mild, bad, or very bad. It can develop at any age and may be outgrown. What are the causes? Pollen from grasses, trees, and weeds. Other causes can be: Dust mites. Smoke. Mold. Car fumes. The pee (urine), spit, or dander of pets. Dander is dead skin cells from a pet. What increases the risk? You are more likely to develop this condition if: You have allergies in your family. You have problems like allergies in your family. You may have: Swelling of parts of your eyes and eyelids. Asthma. This affects how you breathe. Long-term redness and swelling on your skin. Food allergies. What are the signs or symptoms? The main symptom of this condition is a runny or stuffy nose (nasal congestion). Other symptoms may include: Sneezing or coughing. Itching and tearing of your eyes. Mucus that drips down the back of your throat (postnasal drip). This may cause a sore throat. Trouble sleeping. Feeling tired. Headache. How is this treated? There is no cure for this condition. You should avoid things that you are allergic to. Treatment can help to relieve symptoms. This may include: Medicines that block allergy symptoms, such as anti-inflammatories or antihistamines. These may be given as a shot, nasal spray, or pill. Avoiding things you are allergic to. Medicines that give you some of what you are allergic to over time. This is  called immunotherapy. It is done if other treatments do not help. You may get: Shots. Medicine under your tongue. Stronger medicines, if other treatments do not help. Follow these instructions at home: Avoiding allergens Find out what things you are allergic to and avoid them. To do this, try these things: If you get allergies any time of year: Replace carpet with wood, tile, or vinyl flooring. Carpet can trap pet dander and dust. Do not smoke. Do not allow smoking in your home. Change your heating and air conditioning filters at least once a month. If you get allergies only some times of the year: Keep windows closed when you can. Plan things to do outside when pollen counts are lowest. Check pollen counts before you plan things to do outside. When you come indoors, change your clothes and shower before you sit on furniture or bedding. If you are allergic to a pet: Keep the pet out of your bedroom. Vacuum, sweep, and dust often. General instructions Take over-the-counter and prescription medicines only as told by your doctor. Drink enough fluid to keep your pee pale yellow. Where to find more information American Academy of Allergy, Asthma & Immunology: aaaai.org Contact a doctor if: You have a fever. You get a cough that does not go away. You make high-pitched whistling sounds when you breathe, most often when you breathe out (wheeze). Your symptoms slow you down. Your symptoms stop you from doing your normal things each day. Get help right away if: You are short of breath. This symptom may be an emergency. Get help  right away. Call 911. Do not wait to see if the symptom will go away. Do not drive yourself to the hospital. This information is not intended to replace advice given to you by your health care provider. Make sure you discuss any questions you have with your health care provider. Document Revised: 09/03/2021 Document Reviewed: 09/03/2021 Elsevier Patient Education   2023 [employer].

## 2022-04-15 NOTE — Progress Notes (Signed)
Assessment and Plan:  Natalie Baker was seen today for acute visit.  Diagnoses and all orders for this visit:  Flu-like symptoms -     POCT Influenza A/B - Negative  Encounter for screening for COVID-19 -     POC COVID-19- negative  Chronic systolic congestive heart failure Continue medications and follow with cardiology  Seasonal allergic rhinitis, unspecified trigger Push fluids Mucinex q 8 hours as needed Zyrtec daily If symptoms do not resolve in the next week notify the office -     fluticasone (FLONASE) 50 MCG/ACT nasal spray; 1 spray each nostril once a day. -     ipratropium (ATROVENT) 0.03 % nasal spray; Place 2 sprays into the nose 4 (four) times daily.       Further disposition pending results of labs. Discussed med's effects and SE's.   Over 30 minutes of exam, counseling, chart review, and critical decision making was performed.   Future Appointments  Date Time Provider Department Center  04/25/2022 11:20 AM Bensimhon, Bevelyn Bucklesaniel R, MD [facility] None  05/07/2022  1:00 PM [facility] None  05/07/2022  1:30 PM Si GaulMohamed, Mohamed, MD [facility] None  06/14/2022 10:00 AM Adela Glimpseranford, Tonya, NP [facility] None  12/13/2022  9:00 AM Adela Glimpseranford, Tonya, NP [facility] None    ------------------------------------------------------------------------------------------------------------------   HPI BP 108/70   Pulse 75   Temp (!) 97.5 F (36.4 C)   Ht 5' 2.75" (1.594 m)   Wt 178 lb 9.6 oz (81 kg)   SpO2 94%   BMI 31.89 kg/m   67 y.o.female presents for fever , productive cough of yellow/brown mucus.  Clear nasal drainage, ear pain, sore throat, congestion in her chest. Symptoms have been occurring since 6 days ago.  Has not used any over the counter medication.   BP controlled with amiodarone 200 mh 1/2 tab BID and ivabradine 7.5 mg BID which are med for CHF. Denies heart palpitations.  BP Readings from Last 3 Encounters:  04/15/22 108/70  02/05/22 (!) 108/58   12/12/21 126/78   BMI is Body mass index is 31.89 kg/m., she has been working on diet and exercise. 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  Negative Chest Xray 06/16/12    Current Outpatient Medications on File Prior to Visit  Medication Sig   amiodarone (PACERONE) 200 MG tablet TAKE 1 TABLET BY MOUTH EVERY DAY (Patient taking differently: Take 200 mg by mouth daily. 1/2 tablet 2 times daily)   azithromycin (ZITHROMAX) 250 MG tablet Take 2 tablets (500 mg) on  Day 1,  followed by 1 tablet (250 mg) once daily on Days 2 through 5.   cholecalciferol (VITAMIN D3) 25 MCG (  1000 UNIT) tablet Take 1,000 Units by mouth once a week.   ELIQUIS 5 MG TABS tablet TAKE 1 TABLET BY MOUTH TWICE A DAY   estradiol (VIVELLE-DOT) 0.05 MG/24HR patch Place 1 patch onto the skin 2 (two) times a week. Tuesdays & Fridays   FARXIGA 10 MG TABS tablet TAKE 1 TABLET BY MOUTH EVERY DAY   Ibuprofen (MOTRIN PO) Take by mouth.   ivabradine (CORLANOR) 7.5 MG TABS tablet Take 1 tablet by mouth 2 times daily with a meal.   levothyroxine (SYNTHROID) 100 MCG tablet TAKE 1 TABLET BY MOUTH DAILY ON AN EMPTY STOMACH WITH ONLY WATER FOR 30 MINUTES & NO ANTACID/CALCIUM/MAGNESIUM FOR 4 HOURS & AVOID BIOTIN   midodrine (PROAMATINE) 5 MG tablet TAKE 1 TABLET (5 MG TOTAL) BY  MOUTH 3 (THREE) TIMES DAILY WITH MEALS. (Patient not taking: Reported on 01/31/2022)   ondansetron (ZOFRAN) 8 MG tablet Take 8 mg by mouth every 8 (eight) hours as needed for vomiting or nausea. (Patient not taking: Reported on 12/12/2021)   ondansetron (ZOFRAN-ODT) 4 MG disintegrating tablet Take 1 tablet (4 mg total) by mouth every 8 (eight) hours as needed for nausea or vomiting. (Patient not taking: Reported on 01/31/2022)   potassium chloride SA (KLOR-CON M) 20 MEQ tablet Take 1 tablet (20 mEq total) by mouth daily.   prochlorperazine (COMPAZINE) 10 MG tablet TAKE 1 TABLET BY MOUTH EVERY 6 HOURS AS NEEDED FOR NAUSEA OR VOMITING. (Patient not taking: Reported on 01/31/2022)   promethazine-dextromethorphan (PROMETHAZINE-DM) 6.25-15 MG/5ML syrup Take 5 mLs by mouth 4 (four) times daily as needed for cough.   rosuvastatin (CRESTOR) 10 MG tablet Take 1 tablet (10 mg total) by mouth daily. (Patient not taking: Reported on 01/31/2022)   spironolactone (ALDACTONE) 25 MG tablet TAKE 1 TABLET BY MOUTH EVERY DAY (Patient taking differently: 1/2 tablet daily)   sulfamethoxazole-trimethoprim (BACTRIM DS) 800-160 MG tablet Take 1 tablet by mouth See admin instructions. Take 1 tablet twice daily on Saturdays and Sundays ONLY   temazepam (RESTORIL) 15 MG capsule Take 1 capsule (15 mg total) by mouth at bedtime as needed for sleep. (Patient not taking: Reported on 01/31/2022)   torsemide (DEMADEX) 20 MG tablet Take 1 tablet (20 mg total) by mouth daily.   valACYclovir (VALTREX) 500 MG tablet Take 500 mg by mouth 2 (two) times daily. (Patient not taking: Reported on 12/12/2021)   No current facility-administered medications on file prior to visit.    ROS: all negative except above.   Physical Exam:  BP 108/70   Pulse 75   Temp (!) 97.5 F (36.4 C)   Ht 5' 2.75" (1.594 m)   Wt 178 lb 9.6 oz (81 kg)   SpO2 94%   BMI 31.89 kg/m   General Appearance: Well nourished, in no apparent distress. Eyes: PERRLA,  EOMs, conjunctiva no swelling or erythema Sinuses: No Frontal/maxillary tenderness ENT/Mouth: Ext aud canals clear, TMs without erythema, bulging- minimal fluid noted in left ear. No erythema, swelling, or exudate on post pharynx.   Hearing normal.  Neck: Supple, thyroid normal.  Respiratory: Respiratory effort normal, BS equal bilaterally without rales, rhonchi, wheezing or stridor.  Cardio: RRR with no MRGs. Brisk peripheral pulses without edema.  Abdomen: Soft, + BS.  Non tender, no guarding, rebound, hernias, masses. Lymphatics: Non tender without lymphadenopathy.  Musculoskeletal: Full ROM, 5/5 strength, normal gait.  Skin: Warm, dry without rashes, lesions, ecchymosis.  Neuro: Cranial nerves intact. Normal muscle tone, no cerebellar symptoms. Sensation intact.  Psych: Awake  and oriented X 3, normal affect, Insight and Judgment appropriate.     Raynelle Dick, NP 3:29 PM [facility]

## 2022-04-23 ENCOUNTER — Telehealth: Payer: Self-pay | Admitting: Internal Medicine

## 2022-04-23 NOTE — Telephone Encounter (Signed)
Rescheduled 04/30 appointment to 05/07 due to provider pal, patient has bee called and notified.

## 2022-04-25 ENCOUNTER — Ambulatory Visit (HOSPITAL_COMMUNITY)
Admission: RE | Admit: 2022-04-25 | Discharge: 2022-04-25 | Disposition: A | Discharge status: Home / Self Care | Payer: Medicare Other | Source: Ambulatory Visit | Attending: Internal Medicine | Admitting: Internal Medicine

## 2022-04-25 ENCOUNTER — Encounter (HOSPITAL_COMMUNITY): Payer: Self-pay | Admitting: Internal Medicine

## 2022-04-25 VITALS — BP 124/80 | HR 72 | Wt 178.8 lb

## 2022-04-25 DIAGNOSIS — Z79890 Hormone replacement therapy (postmenopausal): Secondary | ICD-10-CM | POA: Insufficient documentation

## 2022-04-25 DIAGNOSIS — I82622 Acute embolism and thrombosis of deep veins of left upper extremity: Secondary | ICD-10-CM | POA: Diagnosis not present

## 2022-04-25 DIAGNOSIS — Z79899 Other long term (current) drug therapy: Secondary | ICD-10-CM | POA: Insufficient documentation

## 2022-04-25 DIAGNOSIS — Z87891 Personal history of nicotine dependence: Secondary | ICD-10-CM | POA: Diagnosis not present

## 2022-04-25 DIAGNOSIS — I340 Nonrheumatic mitral (valve) insufficiency: Secondary | ICD-10-CM

## 2022-04-25 DIAGNOSIS — Z9221 Personal history of antineoplastic chemotherapy: Secondary | ICD-10-CM | POA: Insufficient documentation

## 2022-04-25 DIAGNOSIS — R9431 Abnormal electrocardiogram [ECG] [EKG]: Secondary | ICD-10-CM | POA: Insufficient documentation

## 2022-04-25 DIAGNOSIS — E039 Hypothyroidism, unspecified: Secondary | ICD-10-CM | POA: Insufficient documentation

## 2022-04-25 DIAGNOSIS — Z86718 Personal history of other venous thrombosis and embolism: Secondary | ICD-10-CM

## 2022-04-25 DIAGNOSIS — E785 Hyperlipidemia, unspecified: Secondary | ICD-10-CM | POA: Insufficient documentation

## 2022-04-25 DIAGNOSIS — Z7722 Contact with and (suspected) exposure to environmental tobacco smoke (acute) (chronic): Secondary | ICD-10-CM | POA: Insufficient documentation

## 2022-04-25 DIAGNOSIS — C8310 Mantle cell lymphoma, unspecified site: Secondary | ICD-10-CM | POA: Insufficient documentation

## 2022-04-25 DIAGNOSIS — Z7901 Long term (current) use of anticoagulants: Secondary | ICD-10-CM | POA: Diagnosis not present

## 2022-04-25 DIAGNOSIS — I5022 Chronic systolic (congestive) heart failure: Secondary | ICD-10-CM

## 2022-04-25 DIAGNOSIS — I472 Ventricular tachycardia, unspecified: Secondary | ICD-10-CM

## 2022-04-25 LAB — COMPREHENSIVE METABOLIC PANEL
ALT: 23 U/L (ref 0–44)
AST: 28 U/L (ref 15–41)
Albumin: 4.2 g/dL (ref 3.5–5.0)
Alkaline Phosphatase: 79 U/L (ref 38–126)
Anion gap: 13 (ref 5–15)
BUN: 25 mg/dL — ABNORMAL HIGH (ref 8–23)
CO2: 32 mmol/L (ref 22–32)
Calcium: 9.9 mg/dL (ref 8.9–10.3)
Chloride: 93 mmol/L — ABNORMAL LOW (ref 98–111)
Creatinine, Ser: 1.31 mg/dL — ABNORMAL HIGH (ref 0.44–1.00)
GFR, Estimated: 45 mL/min — ABNORMAL LOW (ref >60)
Glucose, Bld: 102 mg/dL — ABNORMAL HIGH (ref 70–99)
Potassium: 3.7 mmol/L (ref 3.5–5.1)
Sodium: 138 mmol/L (ref 135–145)
Total Bilirubin: 0.3 mg/dL (ref 0.3–1.2)
Total Protein: 6.7 g/dL (ref 6.5–8.1)

## 2022-04-25 LAB — T4, FREE: Free T4: 1.37 ng/dL — ABNORMAL HIGH (ref 0.61–1.12)

## 2022-04-25 LAB — BRAIN NATRIURETIC PEPTIDE: B Natriuretic Peptide: 376.5 pg/mL — ABNORMAL HIGH (ref 0.0–100.0)

## 2022-04-25 LAB — TSH: TSH: 1.813 u[IU]/mL (ref 0.350–4.500)

## 2022-04-25 MED ORDER — AMIODARONE HCL 200 MG PO TABS: 100.0000 mg | ORAL_TABLET | Freq: Every day | ORAL | Status: DC

## 2022-04-25 MED ORDER — LOSARTAN POTASSIUM 25 MG PO TABS: 25.0000 mg | ORAL_TABLET | Freq: Every day | ORAL | 3 refills | Status: DC

## 2022-04-25 NOTE — Patient Instructions (Signed)
Medication Changes:  Start taking Losartan 25 mg (1 tablet) daily at bedtime.  Decrease Amiodarone 100 mg daily  Lab Work:  Labs done today, your results will be available in MyChart, we will contact you for abnormal readings.   Testing/Procedures:  Your physician has requested that you have an echocardiogram. Echocardiography is a painless test that uses sound waves to create images of your heart. It provides your doctor with information about the size and shape of your heart and how well your heart's chambers and valves are working. This procedure takes approximately one hour. There are no restrictions for this procedure. Please do NOT wear cologne, perfume, aftershave, or lotions (deodorant is allowed). Please arrive 15 minutes prior to your appointment time.     Follow-Up in: Follow up in 4 months (please call our clinic in June** to schedule your August appointment)    If you have any questions or concerns before your next appointment please send [place] a message through [place] or call our office at [phone] Monday-Friday 8 am-5 pm.   If you have an urgent need after hours on the weekend please call your Primary Cardiologist or the [facility] in [place] at [phone].

## 2022-04-25 NOTE — Progress Notes (Signed)
Advanced Heart Failure Clinic Note   Referring Physician: PCP: Lucky Cowboy, MD PCP-Cardiologist: Parke Poisson, MD  [facility]: Dr. Gala Romney   Reason for Visit:  F/u for Chronic Systolic Heart Failure  HPI:  Natalie Baker is a 67 y.o. female with a hx of palpitations thought to be PVCs, HTN, non hodgkins lymphoma treated w/ R-CHOP and systolic HF.   Admitted 6/21 with acute HF. ECHO  EF 20-25% w/ mod-severe MR.  RV moderately reduced. Developed VT and was started on amiodarone and transferred to [facility] for [facility] which showed normal coronaries, elevated filling pressures w/ low CO c/w cardiogenic shock. CI was 1.7. She was started on milrinone, IV amiodarone and IV Lasix for diuresis.   Echo 9/21: EF 40-45%   On repeat echo 05/04/20 EF had normalized to 50-55% G2DD.    At oncology f/u 6/22,  repeat CT scan unfortunately showed evidence for disease progression with mild increase in the small bilateral axillary lymph nodes and moderate increase in the left supraclavicular as well as ileocolic mesenteric lymph nodes and left external iliac lymph nodes but mild increase in the periaortic retroperitoneal lymph nodes. She started treatment with acalabrutinib 100 mg p.o. twice daily on June 17 2020 and Dr. Arbutus Ped referred her to [facility] for bone marrow transplant evaluation. As part of w/u at [facility], an echocardiogram was obtained and showed drop in LVEF back down to 40% w/ echodensity vs thickening of the left ventricular apex concerning for possible LV thrombus. Subsequently, she was referred back to [facility] for further evaluation.    Had follow up 10/22, and endorsed stable NYHA II symptoms. Losartan was increased and cMRI arranged to assess for LV thrombus.    cMRI 11/22 LVEF 39%, no LV thrombus noted.   Follow up 11/22 Not a candidate for stem cell transplant, but will trial CAR-T therapy soon.    Recently d/c from [facility] after CAR-T therapy, had cytokine storm, taken off all HF meds due to low BP.  Echo 1/23 with EF 40-45%.    Admitted from HF clinic on 03/07/21 with acute on chronic systolic CHF with low-output. Bedside echo with EF < 20%. PICC line placed. Initial co-ox 34% and she was started on inotrope support with milrinone. Diuresed with IV lasix then transitioned to po torsemide. She failed initial milrinone wean then was eventually titrated off milrinone with stable co-ox. Torsemide decreased to 20 mg daily (starting 03/15) at discharge d/t AKI and low volume. Scr 1.24>>1.64 (baseline 1.0). She was started on GDMT. Had frequent PVCs and runs of NSVT which improved with addition of po amiodarone. Course also complicated by left brachial DVT for which she was started on Eliquis. Discharged on 3/13. D/c wt 172 lb.   At post hospital f/u, she was doing well. Wt only up 2 lb since d/c, 172>>174 lb. ReDs Clip 32%. BP stable, 124/78. EKG showed NSR. No PVCs. NYHA class II. However, her labs returned abnormal. Digoxin level was elevated at 2.5 (EKG ok), Scr had risen further from 1.64>>1.73. She was instructed to stop Digoxin and to hold torsemide x 1 day, then reduced frequency to qod dosing. She had repeat labs on 3/20 which showed Dig level down to 0.5.  SCr back down to 1.6 w/ low K at 3.1. She was instructed to remain off dig and spiro increased to 25 mg daily.   Per Dr. Asa Lente note, she remains on observation w/ plans to continue monthly blood work and f/u w/ him in 2 months.  Echo 3/23 EF 20-25%  Echo 06/29/21 EF 35-40% (read as 30-35%) severe MR  TEE 07/04/21 EF 20-25% severe MR moderate  She returns back today for f/u. Feeling pretty good. Denies CP or SOB. Off midodrine. Takes torsemide every day (sometimes just takes 1/2 pill). Able to do ADLs without problem but gets fatigued if she tries to do more.     Cardiac/Vascular Studies: Echo 06/2019 EF 20-25%  moderately reduced RV function with severe MR/TR Echo 9/21: EF 40-45%, RV normal  Echo  4/22: EF 50-55% G2DD. RV normal  Echo  [facility] 10/04/20: EF 40%, grade I DD, RV ok, mild MR, mild LAE, small circumferential pericardial effusion, Echodensity vs thickening of the left ventricular apex, cannot rule out thrombus Echo 3/23 LVEF 20-25%, RV mildly reduced. Small pericardial effusion, no tamponade    cMRI 11/22 EF 39%, no LV thrombus   Cath 6/18 with normal cors. Low output. Ao = 109/71 (81) LV =  99/23 RA =  15 RV =  43/18 PA =  35/14 (25) PCW = 22 Fick cardiac output/index = 3.3/1.7 PVR = < 1.0 WU Ao sat = 100% PA sat = 49%, 53%    Assessment: 1. Normal coronary arteries 2. Severe NICM EF 20% 3. Elevated filling pressures and low CO c/w cardiogenic shock 4. Brief run of VT   Venous duplex LUE, 03/13/21: Summary:  Right:  No evidence of thrombosis in the subclavian.     Left:  Findings consistent with partial age indeterminate deep vein thrombosis  involving the left brachial veins.   Review of systems complete and found to be negative unless listed in HPI.     Past Medical History:  Diagnosis Date   Allergy    Cancer    being worked up for lymphoma   CHF (congestive heart failure)    Colon polyp 12/29/2006   Dysrhythmia    Frozen shoulder 11/16/2015   Gallstones 08/17/2020   Hyperlipidemia    Hypothyroidism    Obesity    BMI 34   Osteoporosis    Palpitations    Pre-diabetes    Vitamin D deficiency     Current Outpatient Medications  Medication Sig Dispense Refill   amiodarone (PACERONE) 200 MG tablet Take 100 mg by mouth 2 (two) times daily.     Cholecalciferol (VITAMIN D-3) 25 MCG (1000 UT) CAPS Take 1 capsule by mouth daily.     ELIQUIS 5 MG TABS tablet TAKE 1 TABLET BY MOUTH TWICE A DAY 60 tablet 6   estradiol (VIVELLE-DOT) 0.05 MG/24HR patch Place 1 patch onto the skin 2 (two) times a week. Tuesdays & Fridays     FARXIGA 10 MG TABS tablet TAKE 1 TABLET BY MOUTH EVERY DAY 30 tablet 11   fluticasone (FLONASE) 50 MCG/ACT nasal spray Place 1 spray into both nostrils as needed for  allergies or rhinitis.     ipratropium (ATROVENT) 0.03 % nasal spray Place 2 sprays into both nostrils as needed for rhinitis.     ivabradine (CORLANOR) 7.5 MG TABS tablet Take 1 tablet by mouth 2 times daily with a meal. 60 tablet 3   levothyroxine (SYNTHROID) 100 MCG tablet TAKE 1 TABLET BY MOUTH DAILY ON AN EMPTY STOMACH WITH ONLY WATER FOR 30 MINUTES & NO ANTACID/CALCIUM/MAGNESIUM FOR 4 HOURS & AVOID BIOTIN 90 tablet 1   ondansetron (ZOFRAN) 8 MG tablet Take 8 mg by mouth every 8 (eight) hours as needed for vomiting or nausea.     ondansetron (ZOFRAN-ODT) 4  MG disintegrating tablet Take 1 tablet (4 mg total) by mouth every 8 (eight) hours as needed for nausea or vomiting. 30 tablet 3   potassium chloride SA (KLOR-CON M) 20 MEQ tablet Take 1 tablet (20 mEq total) by mouth daily. 30 tablet 6   prochlorperazine (COMPAZINE) 10 MG tablet TAKE 1 TABLET BY MOUTH EVERY 6 HOURS AS NEEDED FOR NAUSEA OR VOMITING. 30 tablet 0   spironolactone (ALDACTONE) 25 MG tablet Take 12.5 mg by mouth daily.     sulfamethoxazole-trimethoprim (BACTRIM DS) 800-160 MG tablet Take 1 tablet by mouth See admin instructions. Take 1 tablet twice daily on Saturdays and Sundays ONLY     torsemide (DEMADEX) 20 MG tablet Take 1 tablet (20 mg total) by mouth daily. 30 tablet 6   valACYclovir (VALTREX) 500 MG tablet Take 500 mg by mouth 2 (two) times daily.     rosuvastatin (CRESTOR) 10 MG tablet Take 1 tablet (10 mg total) by mouth daily. (Patient not taking: Reported on 01/31/2022) 90 tablet 3   No current facility-administered medications for this encounter.    Allergies  Allergen Reactions   Adriamycin [Doxorubicin] Other (See Comments)    Acute heart failure (chemo from Jan - May 2021)   Ppd [Tuberculin Purified Protein Derivative] Swelling    Positive reaction.  Negative Chest Xray 06/16/12      Social History   Socioeconomic History   Marital status: Married    Spouse name: Not on file   Number of children: 1    Years of education: Not on file   Highest education level: Not on file  Occupational History   Occupation: Marine scientist  Tobacco Use   Smoking status: Former    Years: 15    Types: Cigarettes    Quit date: 01/08/1996    Years since quitting: 26.3   Smokeless tobacco: Never   Tobacco comments:    quit 1998 but is exposed to 2nd hand smoke  Vaping Use   Vaping Use: Never used  Substance and Sexual Activity   Alcohol use: No   Drug use: No   Sexual activity: Yes    Partners: Male    Birth control/protection: Post-menopausal  Other Topics Concern   Not on file  Social History Narrative   Married, one daughter, employed as an Conservator, museum/gallery 2 caffeinated drinks per day   Social Determinants of Health   Financial Resource Strain: Low Risk  (03/22/2021)   Overall Financial Resource Strain (CARDIA)    Difficulty of Paying Living Expenses: Not very hard  Food Insecurity: No Food Insecurity (03/22/2021)   Hunger Vital Sign    Worried About Running Out of Food in the Last Year: Never true    Ran Out of Food in the Last Year: Never true  Transportation Needs: No Transportation Needs (03/22/2021)   PRAPARE - Administrator, Civil Service (Medical): No    Lack of Transportation (Non-Medical): No  Physical Activity: Inactive (08/13/2018)   Exercise Vital Sign    Days of Exercise per Week: 0 days    Minutes of Exercise per Session: 0 min  Stress: No Stress Concern Present (08/13/2018)   [employer] of Occupational Health - Occupational Stress Questionnaire    Feeling of Stress : Only a little  Social Connections: Moderately Integrated (07/02/2019)   Social Connection and Isolation Panel [NHANES]    Frequency of Communication with Friends and Family: More than three times a week    Frequency of Social Gatherings with Friends  and Family: Once a week    Attends Religious Services: More than 4 times per year    Active Member of Clubs or Organizations: No    Attends Occupational hygienist Meetings: Never    Marital Status: Married  Catering manager Violence: Not on file      Family History  Problem Relation Age of Onset   Heart disease Mother 24       chf   Diabetes Mother    Thyroid disease Mother    Cancer Mother 70       female/ skin   Hypertension Mother    Liver disease Mother    Hypertension Father    Alcohol abuse Father    Heart disease Father    Breast cancer Sister 75   Diabetes Sister    Hypothyroidism Sister    Hashimoto's thyroiditis Sister    Hyperlipidemia Brother    Hypertension Brother        x 2   Diabetes Brother    Hypothyroidism Brother    Prostate cancer Brother 80   Liver disease Maternal Grandmother    Diabetes Paternal Grandmother    Breast cancer Maternal Aunt    Hyperlipidemia Maternal Aunt    Heart disease Maternal Aunt    Bone cancer Maternal Aunt        metastatic   Breast cancer Niece 51   Colon cancer Neg Hx    Colon polyps Neg Hx    Esophageal cancer Neg Hx    Rectal cancer Neg Hx    Stomach cancer Neg Hx     Vitals:   04/25/22 1133  BP: 124/80  Pulse: 72  SpO2: 97%  Weight: 81.1 kg (178 lb 12.8 oz)    PHYSICAL EXAM: General:  Well appearing. No resp difficulty HEENT: normal Neck: supple. no JVD. Carotids 2+ bilat; no bruits. No lymphadenopathy or thryomegaly appreciated. Cor: PMI nondisplaced. Regular rate & rhythm. No rubs, gallops or murmurs. Lungs: clear Abdomen: obese soft, nontender, nondistended. No hepatosplenomegaly. No bruits or masses. Good bowel sounds. Extremities: no cyanosis, clubbing, rash, tr edema Neuro: alert & orientedx3, cranial nerves grossly intact. moves all 4 extremities w/o difficulty. Affect pleasant  NSR 68 1AVB 200 ms Personally reviewed   ASSESSMENT & PLAN:  1. Chronic Systolic HF  - Echo 1/21 EF 55-60%. RV normal - Echo 6/21 EF 20-25%  moderately reduced RV function with severe MR/TR - Cath 06/25/19 no CAD. - Drop in EF felt most likely from R-CHOP -  Echo 05/04/20 w/ EF 50-55% G2DD. RV normal - Echo 9/22 at [facility] EF back down to 40% w/ ? LV thrombus - cMRI 11/22 LVEF 39%, no LV clot - Echo 1/23 [facility]): EF 40-45% - Echo 03/08/21: EF 20-25%, RV mildly reduced, RVSP 44 mmHg - Direct admit from clinic 03/07/21  Repeat echo, EF 20%.  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 Required milrinone for low-output.  - Echo 06/29/21 EF improved 35-40% (read as 30-35%) with new severe MR - TEE 07/04/21 EF 20-25% severe MR moderate - Stable NYHA II-III. Volume stable on torsemide 20 qd - Continue farxiga 10 mg daily - Off midodrine with improved BP - Will attempt to restart losartan 25 qhs to see if she can tolerate - Off Digoxin due to elevated level  - Continue spiro 25 mg daily.  - Continue ivabradine 7.5 mg twice a day.  - Due for echo ASAP  -> ordered  2. H/o VT + PVCs - No palpitations RRR on exam   -  Decrease amio to 100 daily - Check amio labs  3. Severe MR  - likely functional - TEE 07/04/21 EF 20-25% severe MR moderate - repeat echo to reassess   4. Stage IV non-Hodgkin's lymphoma, mantle cell lymphoma   - s/p R-CHOP w/ Neulasta. - Evidence of disease progression on f/u Chest CT 6/22. - s/p CAR-T therapy. - Followed at [facility]  5. Partial Lt Brachial DVT (provoked) - Diagnosed 03/13/21 - on Eliquis. Denies abnormal bleeding    6. HL - LDL 155  - Continue crestor 10    7. HRT - ok to continue from cardiology standpoint as no significant drug interaction and previous DVT was provoked - d/w HF pharmD   Arvilla Meres, MD 04/25/22

## 2022-04-26 LAB — T3, FREE: T3, Free: 1.6 pg/mL — ABNORMAL LOW (ref 2.0–4.4)

## 2022-05-06 ENCOUNTER — Other Ambulatory Visit: Payer: Medicare Other

## 2022-05-06 ENCOUNTER — Ambulatory Visit: Payer: Medicare Other | Admitting: Internal Medicine

## 2022-05-07 ENCOUNTER — Other Ambulatory Visit: Payer: Medicare Other

## 2022-05-07 ENCOUNTER — Ambulatory Visit: Payer: Medicare Other | Admitting: Internal Medicine

## 2022-05-14 ENCOUNTER — Other Ambulatory Visit: Payer: Self-pay

## 2022-05-14 ENCOUNTER — Inpatient Hospital Stay (HOSPITAL_BASED_OUTPATIENT_CLINIC_OR_DEPARTMENT_OTHER): Payer: Medicare Other | Admitting: Internal Medicine

## 2022-05-14 ENCOUNTER — Inpatient Hospital Stay: Payer: Medicare Other | Attending: Internal Medicine

## 2022-05-14 DIAGNOSIS — G4700 Insomnia, unspecified: Secondary | ICD-10-CM | POA: Insufficient documentation

## 2022-05-14 DIAGNOSIS — C8310 Mantle cell lymphoma, unspecified site: Secondary | ICD-10-CM | POA: Insufficient documentation

## 2022-05-14 DIAGNOSIS — C8313 Mantle cell lymphoma, intra-abdominal lymph nodes: Secondary | ICD-10-CM

## 2022-05-14 LAB — CMP (CANCER CENTER ONLY)
ALT: 24 U/L (ref 0–44)
AST: 24 U/L (ref 15–41)
Albumin: 4 g/dL (ref 3.5–5.0)
Alkaline Phosphatase: 80 U/L (ref 38–126)
Anion gap: 9 (ref 5–15)
BUN: 18 mg/dL (ref 8–23)
CO2: 29 mmol/L (ref 22–32)
Calcium: 9.1 mg/dL (ref 8.9–10.3)
Chloride: 99 mmol/L (ref 98–111)
Creatinine: 1.31 mg/dL — ABNORMAL HIGH (ref 0.44–1.00)
GFR, Estimated: 45 mL/min — ABNORMAL LOW (ref >60)
Glucose, Bld: 80 mg/dL (ref 70–99)
Potassium: 3.3 mmol/L — ABNORMAL LOW (ref 3.5–5.1)
Sodium: 137 mmol/L (ref 135–145)
Total Bilirubin: 0.6 mg/dL (ref 0.3–1.2)
Total Protein: 6.7 g/dL (ref 6.5–8.1)

## 2022-05-14 LAB — CBC WITH DIFFERENTIAL (CANCER CENTER ONLY)
Abs Immature Granulocytes: 0.21 10*3/uL — ABNORMAL HIGH (ref 0.00–0.07)
Basophils Absolute: 0 10*3/uL (ref 0.0–0.1)
Basophils Relative: 1 %
Eosinophils Absolute: 0 10*3/uL (ref 0.0–0.5)
Eosinophils Relative: 1 %
HCT: 41.5 % (ref 36.0–46.0)
Hemoglobin: 13.5 g/dL (ref 12.0–15.0)
Immature Granulocytes: 7 %
Lymphocytes Relative: 9 %
Lymphs Abs: 0.3 10*3/uL — ABNORMAL LOW (ref 0.7–4.0)
MCH: 30.5 pg (ref 26.0–34.0)
MCHC: 32.5 g/dL (ref 30.0–36.0)
MCV: 93.7 fL (ref 80.0–100.0)
Monocytes Absolute: 0.3 10*3/uL (ref 0.1–1.0)
Monocytes Relative: 11 %
Neutro Abs: 2.2 10*3/uL (ref 1.7–7.7)
Neutrophils Relative %: 71 %
Platelet Count: 265 10*3/uL (ref 150–400)
RBC: 4.43 MIL/uL (ref 3.87–5.11)
RDW: 15.5 % (ref 11.5–15.5)
Smear Review: NORMAL
WBC Count: 3.1 10*3/uL — ABNORMAL LOW (ref 4.0–10.5)
nRBC: 0 % (ref 0.0–0.2)

## 2022-05-14 LAB — LACTATE DEHYDROGENASE: LDH: 188 U/L (ref 98–192)

## 2022-05-14 NOTE — Progress Notes (Signed)
[facility] [phone]   [phone]  OFFICE PROGRESS NOTE  Lucky Cowboy, MD [address] [place] [address]  DIAGNOSIS: Stage IV non-Hodgkin's lymphoma, mantle cell lymphoma.  She presented with left iliac and retroperitoneal lymphadenopathy.  She also presented with left axillary lymphadenopathy and bone marrow involvement.  She was diagnosed in December 2020.   PRIOR THERAPY:  1) Systemic chemotherapy with R-CHOP with Neulasta support.  First dose on 01/27/2019. Status post 6 cycles.   2) Acalabrutinib 100 mg p.o. twice daily.  First dose started June 17, 2020.  Status post 3 months of treatment. 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 female returns to the clinic today for follow-up visit.  Her daughter Archie Patten was available by phone during the visit.  The patient is feeling fine today with no concerning complaints.  She denied having any chest pain, shortness of breath, cough or hemoptysis.  She has occasional nausea with no vomiting, diarrhea or constipation.  She has no headache or visual changes.  She denied having any recent weight loss or night sweats.  She has been on observation and feeling well.  She is here today for evaluation and repeat blood work.   MEDICAL HISTORY: Past Medical History:  Diagnosis Date   Allergy    Cancer (HCC)    being worked up for lymphoma   CHF (congestive heart failure) (HCC)    Colon polyp 12/29/2006   Dysrhythmia    Frozen shoulder 11/16/2015   Gallstones 08/17/2020   Hyperlipidemia    Hypothyroidism    Obesity    BMI 34   Osteoporosis    Palpitations     Pre-diabetes    Vitamin D deficiency     ALLERGIES:  is allergic to adriamycin [doxorubicin] and ppd [tuberculin purified protein derivative].  MEDICATIONS:  Current Outpatient Medications  Medication Sig Dispense Refill   amiodarone (PACERONE) 200 MG tablet Take 0.5 tablets (100 mg total) by mouth daily.     Cholecalciferol (VITAMIN D-3) 25 MCG (1000 UT) CAPS Take 1 capsule by mouth daily.     ELIQUIS 5 MG TABS tablet TAKE 1 TABLET BY MOUTH TWICE A DAY 60 tablet 6   estradiol (VIVELLE-DOT) 0.05 MG/24HR patch Place 1 patch onto the skin 2 (two) times a week. Tuesdays & Fridays     FARXIGA 10 MG TABS tablet TAKE 1 TABLET BY MOUTH EVERY DAY 30 tablet 11   fluticasone (FLONASE) 50 MCG/ACT nasal spray Place 1 spray into both nostrils as needed for allergies or rhinitis.     ipratropium (ATROVENT) 0.03 % nasal spray Place 2 sprays into both nostrils as needed for rhinitis.     ivabradine (CORLANOR) 7.5 MG TABS tablet Take 1 tablet by mouth 2 times daily with a meal. 60 tablet 3   levothyroxine (SYNTHROID) 100 MCG tablet TAKE 1 TABLET BY MOUTH DAILY ON AN EMPTY STOMACH WITH ONLY WATER FOR 30 MINUTES & NO ANTACID/CALCIUM/MAGNESIUM FOR 4 HOURS & AVOID BIOTIN 90 tablet 1   losartan (COZAAR) 25 MG tablet Take 1 tablet (25 mg total) by mouth at bedtime. 90 tablet 3   ondansetron (ZOFRAN) 8 MG tablet Take 8  mg by mouth every 8 (eight) hours as needed for vomiting or nausea.     ondansetron (ZOFRAN-ODT) 4 MG disintegrating tablet Take 1 tablet (4 mg total) by mouth every 8 (eight) hours as needed for nausea or vomiting. 30 tablet 3   potassium chloride SA (KLOR-CON M) 20 MEQ tablet Take 1 tablet (20 mEq total) by mouth daily. 30 tablet 6   prochlorperazine (COMPAZINE) 10 MG tablet TAKE 1 TABLET BY MOUTH EVERY 6 HOURS AS NEEDED FOR NAUSEA OR VOMITING. 30 tablet 0   rosuvastatin (CRESTOR) 10 MG tablet Take 1 tablet (10 mg total) by mouth daily. (Patient not taking: Reported on 01/31/2022) 90 tablet 3    spironolactone (ALDACTONE) 25 MG tablet Take 12.5 mg by mouth daily.     sulfamethoxazole-trimethoprim (BACTRIM DS) 800-160 MG tablet Take 1 tablet by mouth See admin instructions. Take 1 tablet twice daily on Saturdays and Sundays ONLY     torsemide (DEMADEX) 20 MG tablet Take 1 tablet (20 mg total) by mouth daily. 30 tablet 6   valACYclovir (VALTREX) 500 MG tablet Take 500 mg by mouth 2 (two) times daily.     No current facility-administered medications for this visit.    SURGICAL HISTORY:  Past Surgical History:  Procedure Laterality Date   ABDOMINAL HYSTERECTOMY  1997   CESAREAN SECTION  1983   CHOLECYSTECTOMY N/A 09/25/2020   Procedure: LAPAROSCOPIC CHOLECYSTECTOMY;  Surgeon: Kinsinger, Luke Aaron, MD;  Location: [facility] ORS;  Service: General;  Laterality: N/A;  60 MINUTES   COLONOSCOPY W/ BIOPSIES  12/29/2006   IR IMAGING GUIDED PORT INSERTION  01/15/2019   LAPAROSCOPY     19 91 for emdometriosis   RIGHT/LEFT HEART CATH AND CORONARY ANGIOGRAPHY N/A 06/25/2019   Procedure: RIGHT/LEFT HEART CATH AND CORONARY ANGIOGRAPHY;  Surgeon: Dolores Patty, MD;  Location: [facility] INVASIVE CV LAB;  Service: Cardiovascular;  Laterality: N/A;   TEE WITHOUT CARDIOVERSION N/A 07/04/2021   Procedure: TRANSESOPHAGEAL ECHOCARDIOGRAM (TEE);  Surgeon: Dolores Patty, MD;  Location: [facility] ENDOSCOPY;  Service: Cardiovascular;  Laterality: N/A;   UPPER GASTROINTESTINAL ENDOSCOPY      REVIEW OF SYSTEMS:  A comprehensive review of systems was negative.   PHYSICAL EXAMINATION: General appearance: alert, cooperative, and no distress Head: Normocephalic, without obvious abnormality, atraumatic Neck: no adenopathy, no JVD, supple, symmetrical, trachea midline, and thyroid not enlarged, symmetric, no tenderness/mass/nodules Lymph nodes: Cervical, supraclavicular, and axillary nodes normal. Resp: clear to auscultation bilaterally Back: symmetric, no curvature. ROM normal. No CVA tenderness. Cardio: regular rate and  rhythm, S1, S2 normal, no murmur, click, rub or gallop GI: soft, non-tender; bowel sounds normal; no masses,  no organomegaly Extremities: extremities normal, atraumatic, no cyanosis or edema  ECOG PERFORMANCE STATUS: 1 - Symptomatic but completely ambulatory  There were no vitals taken for this visit.  LABORATORY DATA: Lab Results  Component Value Date   WBC 3.1 (L) 05/14/2022   HGB 13.5 05/14/2022   HCT 41.5 05/14/2022   MCV 93.7 05/14/2022   PLT 265 05/14/2022      Chemistry      Component Value Date/Time   NA 138 04/25/2022 1232   K 3.7 04/25/2022 1232   CL 93 (L) 04/25/2022 1232   CO2 32 04/25/2022 1232   BUN 25 (H) 04/25/2022 1232   CREATININE 1.31 (H) 04/25/2022 1232   CREATININE 1.00 03/22/2022 1042   CREATININE 1.03 12/12/2020 0957      Component Value Date/Time   CALCIUM 9.9 04/25/2022 1232   ALKPHOS 79 04/25/2022 1232  AST 28 04/25/2022 1232   AST 22 03/22/2022 1042   ALT 23 04/25/2022 1232   ALT 19 03/22/2022 1042   BILITOT 0.3 04/25/2022 1232   BILITOT 0.5 03/22/2022 1042       RADIOGRAPHIC STUDIES: No results found.   ASSESSMENT AND PLAN: This is a very pleasant 67 years old white female recently diagnosed with stage IV less aggressive mantle cell lymphoma presented with left iliac and retroperitoneal lymphadenopathy as well as left axillary and bone marrow involvement in December 2020. The patient completed 6 cycles of systemic chemotherapy with R-CHOP.  She tolerated her treatment fairly well with no concerning adverse effects but few months later she developed congestive heart failure with drop in her ejection fraction to the 20-25%.  This has improved over the last few months and currently around 55%.  She is followed by Dr. Gala Romney. Unfortunately repeat CT scan in June 2022 showed evidence for disease progression with mild increase in the small bilateral axillary lymph nodes and moderate increase in the left supraclavicular as well as ileocolic  mesenteric lymph nodes and left external iliac lymph nodes but mild increase in the periaortic retroperitoneal lymph nodes. The patient is started treatment with acalabrutinib 100 mg p.o. twice daily on June 17, 2020  She was seen by Dr. Lorenza Chick at [facility] on July 12, 2020 for discussion of potential autologous stem cell transplant.   The patient had repeat CT scan of the chest, abdomen pelvis performed recently that showed decrease in the size of mesenteric lymph node/mass of the right hemiabdomen, left external iliac lymph nodes and lymph nodes adjacent to the pancreatic tail/splenic hilum but there was mild increase in the size of the mediastinal paraesophageal nodes and retroperitoneal periaortic lymph node as well as increase in the size of the spleen. 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 She is currently on observation and feeling fine with no concerning complaints. Repeat CBC today showed total white blood count of 3.1 with normal hemoglobin and hematocrit as well as platelet count.  Comprehensive metabolic panel and LDH are still pending. I recommended for the patient to continue on observation with repeat blood work in 6 months. She was advised to call immediately if she has any other concerning symptoms in the interval. For the insomnia, I will give her a refill of Restoril 15 mg p.o. nightly.  The patient voices understanding of current disease status and treatment options and is in agreement with the current care plan.  All questions were answered. The patient knows to call the clinic with any problems, questions or concerns. We can certainly see the patient much sooner if necessary.  Disclaimer: This note was dictated with voice recognition software. Similar sounding words can  inadvertently be transcribed and may not be corrected upon review.

## 2022-05-14 NOTE — Addendum Note (Signed)
Addended by: Si Gaul on: 05/14/2022 09:37 AM   Modules accepted: Orders

## 2022-05-17 ENCOUNTER — Ambulatory Visit (HOSPITAL_COMMUNITY)
Admission: RE | Admit: 2022-05-17 | Discharge: 2022-05-17 | Disposition: A | Discharge status: Home / Self Care | Payer: Medicare Other | Source: Ambulatory Visit | Attending: Internal Medicine | Admitting: Internal Medicine

## 2022-05-17 DIAGNOSIS — I493 Ventricular premature depolarization: Secondary | ICD-10-CM | POA: Insufficient documentation

## 2022-05-17 DIAGNOSIS — E785 Hyperlipidemia, unspecified: Secondary | ICD-10-CM | POA: Insufficient documentation

## 2022-05-17 DIAGNOSIS — I340 Nonrheumatic mitral (valve) insufficiency: Secondary | ICD-10-CM | POA: Diagnosis not present

## 2022-05-17 DIAGNOSIS — I509 Heart failure, unspecified: Secondary | ICD-10-CM | POA: Insufficient documentation

## 2022-05-17 DIAGNOSIS — I5022 Chronic systolic (congestive) heart failure: Secondary | ICD-10-CM | POA: Diagnosis not present

## 2022-05-17 LAB — ECHOCARDIOGRAM COMPLETE
Area-P 1/2: 4.8 cm2
Est EF: 35
MV M vel: 4.79 m/s
MV Peak grad: 91.8 mmHg
S' Lateral: 4.1 cm
Single Plane A4C EF: 38.5 %

## 2022-05-17 NOTE — Progress Notes (Signed)
  Echocardiogram 2D Echocardiogram has been performed.  Delcie Roch 05/17/2022, 3:58 PM

## 2022-06-01 ENCOUNTER — Other Ambulatory Visit (HOSPITAL_COMMUNITY): Payer: Self-pay | Admitting: Internal Medicine

## 2022-06-14 ENCOUNTER — Ambulatory Visit: Payer: Medicare Other | Admitting: Nurse Practitioner

## 2022-06-17 ENCOUNTER — Ambulatory Visit: Payer: Medicare Other | Admitting: Nurse Practitioner

## 2022-06-20 ENCOUNTER — Ambulatory Visit: Payer: Medicare Other | Admitting: Nurse Practitioner

## 2022-07-04 ENCOUNTER — Other Ambulatory Visit (HOSPITAL_COMMUNITY): Payer: Self-pay | Admitting: Cardiology

## 2022-07-04 MED ORDER — APIXABAN 5 MG PO TABS: 5.0000 mg | ORAL_TABLET | Freq: Two times a day (BID) | ORAL | 6 refills | Status: DC

## 2022-07-05 ENCOUNTER — Other Ambulatory Visit: Payer: Self-pay | Admitting: Nurse Practitioner

## 2022-07-05 DIAGNOSIS — J302 Other seasonal allergic rhinitis: Secondary | ICD-10-CM

## 2022-07-27 ENCOUNTER — Other Ambulatory Visit: Payer: Self-pay | Admitting: Nurse Practitioner

## 2022-07-27 DIAGNOSIS — J302 Other seasonal allergic rhinitis: Secondary | ICD-10-CM

## 2022-08-06 ENCOUNTER — Encounter: Payer: Self-pay | Admitting: Internal Medicine

## 2022-08-08 ENCOUNTER — Encounter: Payer: Medicare Other | Admitting: Physician Assistant

## 2022-08-08 ENCOUNTER — Other Ambulatory Visit: Payer: Self-pay

## 2022-08-08 ENCOUNTER — Other Ambulatory Visit: Payer: Medicare Other

## 2022-08-08 ENCOUNTER — Inpatient Hospital Stay: Payer: Medicare Other

## 2022-08-08 ENCOUNTER — Inpatient Hospital Stay: Payer: Medicare Other | Attending: Internal Medicine | Admitting: Physician Assistant

## 2022-08-08 VITALS — BP 112/50 | HR 75 | Temp 98.3°F | Resp 14 | Ht 62.75 in | Wt 189.3 lb

## 2022-08-08 DIAGNOSIS — C8318 Mantle cell lymphoma, lymph nodes of multiple sites: Secondary | ICD-10-CM | POA: Diagnosis present

## 2022-08-08 DIAGNOSIS — M7989 Other specified soft tissue disorders: Secondary | ICD-10-CM | POA: Diagnosis not present

## 2022-08-08 DIAGNOSIS — D708 Other neutropenia: Secondary | ICD-10-CM

## 2022-08-08 DIAGNOSIS — R599 Enlarged lymph nodes, unspecified: Secondary | ICD-10-CM | POA: Diagnosis not present

## 2022-08-08 DIAGNOSIS — D709 Neutropenia, unspecified: Secondary | ICD-10-CM | POA: Diagnosis not present

## 2022-08-08 LAB — CMP (CANCER CENTER ONLY)
ALT: 30 U/L (ref 0–44)
AST: 24 U/L (ref 15–41)
Albumin: 4.3 g/dL (ref 3.5–5.0)
Alkaline Phosphatase: 83 U/L (ref 38–126)
Anion gap: 6 (ref 5–15)
BUN: 18 mg/dL (ref 8–23)
CO2: 31 mmol/L (ref 22–32)
Calcium: 9.3 mg/dL (ref 8.9–10.3)
Chloride: 102 mmol/L (ref 98–111)
Creatinine: 1.1 mg/dL — ABNORMAL HIGH (ref 0.44–1.00)
GFR, Estimated: 55 mL/min — ABNORMAL LOW (ref >60)
Glucose, Bld: 96 mg/dL (ref 70–99)
Potassium: 4 mmol/L (ref 3.5–5.1)
Sodium: 139 mmol/L (ref 135–145)
Total Bilirubin: 0.4 mg/dL (ref 0.3–1.2)
Total Protein: 6.5 g/dL (ref 6.5–8.1)

## 2022-08-08 LAB — CBC WITH DIFFERENTIAL (CANCER CENTER ONLY)
Abs Immature Granulocytes: 0.18 10*3/uL — ABNORMAL HIGH (ref 0.00–0.07)
Basophils Absolute: 0.1 10*3/uL (ref 0.0–0.1)
Basophils Relative: 2 %
Eosinophils Absolute: 0 10*3/uL (ref 0.0–0.5)
Eosinophils Relative: 1 %
HCT: 40.6 % (ref 36.0–46.0)
Hemoglobin: 13.3 g/dL (ref 12.0–15.0)
Immature Granulocytes: 6 %
Lymphocytes Relative: 10 %
Lymphs Abs: 0.3 10*3/uL — ABNORMAL LOW (ref 0.7–4.0)
MCH: 29.4 pg (ref 26.0–34.0)
MCHC: 32.8 g/dL (ref 30.0–36.0)
MCV: 89.8 fL (ref 80.0–100.0)
Monocytes Absolute: 0.5 10*3/uL (ref 0.1–1.0)
Monocytes Relative: 18 %
Neutro Abs: 1.9 10*3/uL (ref 1.7–7.7)
Neutrophils Relative %: 63 %
Platelet Count: 279 10*3/uL (ref 150–400)
RBC: 4.52 MIL/uL (ref 3.87–5.11)
RDW: 15.2 % (ref 11.5–15.5)
Smear Review: NORMAL
WBC Count: 3 10*3/uL — ABNORMAL LOW (ref 4.0–10.5)
nRBC: 0 % (ref 0.0–0.2)

## 2022-08-08 LAB — LACTATE DEHYDROGENASE: LDH: 215 U/L — ABNORMAL HIGH (ref 98–192)

## 2022-08-08 NOTE — Progress Notes (Signed)
Symptom Management Consult Note [facility]    Patient Care Team: Lucky Cowboy, MD as PCP - General (Internal Medicine) Parke Poisson, MD as PCP - Cardiology (Cardiology) Iva Boop, MD as Consulting Physician (Gastroenterology) Huel Cote, MD as Consulting Physician (Obstetrics and Gynecology) Cherlyn Roberts, MD as Consulting Physician (Dermatology) Si Gaul, MD as Consulting Physician (Oncology)    Name / MRN / DOB: Natalie Baker  [id number]  01-01-1956   Date of visit: 08/08/2022   Chief Complaint/Reason for visit: arm swelling   Current Therapy: observation    ASSESSMENT & PLAN: Patient is a 67 y.o. female  with oncologic history of stage IV non-Hodgkin's lymphoma, mantle cell lymphoma.  followed by Dr. Arbutus Ped.  I have viewed most recent oncology note and lab work.    #Stage IV non-Hodgkin's lymphoma, mantle cell lymphoma.  - History of Car-T therapy at [facility] in 2023 - Next appointment with oncologist is 11/12/22   #Arm swelling -Exam consistent with mild bilateral epitrochlear lymphadenopathy. No other lymphadenopathy appreciated. -Will get bilateral soft tissue US for further evaluation.  -LDH showing mild elevation 215 compared to 188 x 2 months ago.   #Neutropenia -WBC today is 3.0, consistent with previous labs over the last year. -No infectious symptoms. Discussed neutropenic precautions.   Patient discussed with Dr. Shirline Frees who agrees with plan of care.  Strict ED precautions discussed should symptoms worsen.    Heme/Onc History: Oncology History  Mantle cell lymphoma (HCC)  01/12/2019 Initial Diagnosis   Mantle cell lymphoma (HCC)   01/27/2019 - 05/15/2019 Chemotherapy             Interval history-: Natalie Baker is a 67 y.o. female with oncologic history as above presenting to [facility] today with chief complaint of arm swelling.  Presents to clinic unaccompanied today.  Patient states 3 days  ago she noticed bumps on her left arm near her elbow.  She denies any injury or trauma to the area.  She then noticed 1 on her right arm as well.  She states they are not painful and have not grown in size since she first noticed them.  She did not take any over-the-counter medications prior to arrival.  She denies any fever, weight loss or night sweats.     ROS  All other systems are reviewed and are negative for acute change except as noted in the HPI.    Allergies  Allergen Reactions   Adriamycin [Doxorubicin] Other (See Comments)    Acute heart failure (chemo from Jan - May 2021)   Ppd [Tuberculin Purified Protein Derivative] Swelling    Positive reaction.  Negative Chest Xray 06/16/12     Past Medical History:  Diagnosis Date   Allergy    Cancer (HCC)    being worked up for lymphoma   CHF (congestive heart failure) (HCC)    Colon polyp 12/29/2006   Dysrhythmia    Frozen shoulder 11/16/2015   Gallstones 08/17/2020   Hyperlipidemia    Hypothyroidism    Obesity    BMI 34   Osteoporosis    Palpitations    Pre-diabetes    Vitamin D deficiency      Past Surgical History:  Procedure Laterality Date   ABDOMINAL HYSTERECTOMY  1997   CESAREAN SECTION  1983   CHOLECYSTECTOMY N/A 09/25/2020   Procedure: LAPAROSCOPIC CHOLECYSTECTOMY;  Surgeon: Rodman Pickle, MD;  Location: [facility] ORS;  Service: General;  Laterality: N/A;  60 MINUTES  COLONOSCOPY W/ BIOPSIES  12/29/2006   IR IMAGING GUIDED PORT INSERTION  01/15/2019   LAPAROSCOPY     1991 for emdometriosis   RIGHT/LEFT HEART CATH AND CORONARY ANGIOGRAPHY N/A 06/25/2019   Procedure: RIGHT/LEFT HEART CATH AND CORONARY ANGIOGRAPHY;  Surgeon: Dolores Patty, MD;  Location: [facility] INVASIVE CV LAB;  Service: Cardiovascular;  Laterality: N/A;   TEE WITHOUT CARDIOVERSION N/A 07/04/2021   Procedure: TRANSESOPHAGEAL ECHOCARDIOGRAM (TEE);  Surgeon: Dolores Patty, MD;  Location: [facility] ENDOSCOPY;  Service: Cardiovascular;   Laterality: N/A;   UPPER GASTROINTESTINAL ENDOSCOPY      Social History   Socioeconomic History   Marital status: Married    Spouse name: Not on file   Number of children: 1   Years of education: Not on file   Highest education level: Not on file  Occupational History   Occupation: Marine scientist  Tobacco Use   Smoking status: Former    Current packs/day: 0.00    Types: Cigarettes    Start date: 01/07/1981    Quit date: 01/08/1996    Years since quitting: 26.6   Smokeless tobacco: Never   Tobacco comments:    quit 1998 but is exposed to 2nd hand smoke  Vaping Use   Vaping status: Never Used  Substance and Sexual Activity   Alcohol use: No   Drug use: No   Sexual activity: Yes    Partners: Male    Birth control/protection: Post-menopausal  Other Topics Concern   Not on file  Social History Narrative   Married, one daughter, employed as an Conservator, museum/gallery 2 caffeinated drinks per day   Social Determinants of Health   Financial Resource Strain: Low Risk  (03/22/2021)   Overall Financial Resource Strain (CARDIA)    Difficulty of Paying Living Expenses: Not very hard  Food Insecurity: No Food Insecurity (03/22/2021)   Hunger Vital Sign    Worried About Running Out of Food in the Last Year: Never true    Ran Out of Food in the Last Year: Never true  Transportation Needs: No Transportation Needs (03/22/2021)   PRAPARE - Administrator, Civil Service (Medical): No    Lack of Transportation (Non-Medical): No  Physical Activity: Inactive (08/13/2018)   Exercise Vital Sign    Days of Exercise per Week: 0 days    Minutes of Exercise per Session: 0 min  Stress: No Stress Concern Present (08/13/2018)   [employer] of Occupational Health - Occupational Stress Questionnaire    Feeling of Stress : Only a little  Social Connections: Moderately Integrated (07/02/2019)   Social Connection and Isolation Panel [NHANES]    Frequency of Communication with Friends and  Family: More than three times a week    Frequency of Social Gatherings with Friends and Family: Once a week    Attends Religious Services: More than 4 times per year    Active Member of [employer] or Organizations: No    Attends Banker Meetings: Never    Marital Status: Married  Catering manager Violence: Not on file    Family History  Problem Relation Age of Onset   Heart disease Mother 49       chf   Diabetes Mother    Thyroid disease Mother    Cancer Mother 32       female/ skin   Hypertension Mother    Liver disease Mother    Hypertension Father    Alcohol abuse Father    Heart disease Father  Breast cancer Sister 63   Diabetes Sister    Hypothyroidism Sister    Hashimoto's thyroiditis Sister    Hyperlipidemia Brother    Hypertension Brother        x 2   Diabetes Brother    Hypothyroidism Brother    Prostate cancer Brother 12   Liver disease Maternal Grandmother    Diabetes Paternal Grandmother    Breast cancer Maternal Aunt    Hyperlipidemia Maternal Aunt    Heart disease Maternal Aunt    Bone cancer Maternal Aunt        metastatic   Breast cancer Niece 23   Colon cancer Neg Hx    Colon polyps Neg Hx    Esophageal cancer Neg Hx    Rectal cancer Neg Hx    Stomach cancer Neg Hx      Current Outpatient Medications:    amiodarone (PACERONE) 200 MG tablet, Take 0.5 tablets (100 mg total) by mouth daily., Disp: , Rfl:    apixaban (ELIQUIS) 5 MG TABS tablet, Take 1 tablet (5 mg total) by mouth 2 (two) times daily., Disp: 60 tablet, Rfl: 6   Cholecalciferol (VITAMIN D-3) 25 MCG (1000 UT) CAPS, Take 1 capsule by mouth daily., Disp: , Rfl:    CORLANOR 7.5 MG TABS tablet, TAKE 1 TABLET BY MOUTH TWICE A DAY WITH FOOD, Disp: 60 tablet, Rfl: 3   estradiol (VIVELLE-DOT) 0.05 MG/24HR patch, Place 1 patch onto the skin 2 (two) times a week. Tuesdays & Fridays, Disp: , Rfl:    FARXIGA 10 MG TABS tablet, TAKE 1 TABLET BY MOUTH EVERY DAY, Disp: 30 tablet, Rfl: 11    fluticasone (FLONASE) 50 MCG/ACT nasal spray, Place 1 spray into both nostrils as needed for allergies or rhinitis., Disp: , Rfl:    ipratropium (ATROVENT) 0.03 % nasal spray, PLACE 2 SPRAYS INTO THE NOSE 4 (FOUR) TIMES DAILY., Disp: 30 mL, Rfl: 1   levothyroxine (SYNTHROID) 100 MCG tablet, TAKE 1 TABLET BY MOUTH DAILY ON AN EMPTY STOMACH WITH ONLY WATER FOR 30 MINUTES & NO ANTACID/CALCIUM/MAGNESIUM FOR 4 HOURS & AVOID BIOTIN, Disp: 90 tablet, Rfl: 1   losartan (COZAAR) 25 MG tablet, Take 1 tablet (25 mg total) by mouth at bedtime., Disp: 90 tablet, Rfl: 3   ondansetron (ZOFRAN) 8 MG tablet, Take 8 mg by mouth every 8 (eight) hours as needed for vomiting or nausea., Disp: , Rfl:    ondansetron (ZOFRAN-ODT) 4 MG disintegrating tablet, Take 1 tablet (4 mg total) by mouth every 8 (eight) hours as needed for nausea or vomiting., Disp: 30 tablet, Rfl: 3   potassium chloride SA (KLOR-CON M) 20 MEQ tablet, Take 1 tablet (20 mEq total) by mouth daily., Disp: 30 tablet, Rfl: 6   prochlorperazine (COMPAZINE) 10 MG tablet, TAKE 1 TABLET BY MOUTH EVERY 6 HOURS AS NEEDED FOR NAUSEA OR VOMITING., Disp: 30 tablet, Rfl: 0   rosuvastatin (CRESTOR) 10 MG tablet, Take 1 tablet (10 mg total) by mouth daily. (Patient not taking: Reported on 01/31/2022), Disp: 90 tablet, Rfl: 3   spironolactone (ALDACTONE) 25 MG tablet, Take 12.5 mg by mouth daily., Disp: , Rfl:    sulfamethoxazole-trimethoprim (BACTRIM DS) 800-160 MG tablet, Take 1 tablet by mouth See admin instructions. Take 1 tablet twice daily on Saturdays and Sundays ONLY, Disp: , Rfl:    torsemide (DEMADEX) 20 MG tablet, Take 1 tablet (20 mg total) by mouth daily., Disp: 30 tablet, Rfl: 6   valACYclovir (VALTREX) 500 MG tablet, Take 500 mg  by mouth 2 (two) times daily., Disp: , Rfl:   PHYSICAL EXAM: ECOG FS:1 - Symptomatic but completely ambulatory    Vitals:   08/08/22 1124  BP: (!) 112/50  Pulse: 75  Resp: 14  Temp: 98.3 F (36.8 C)  TempSrc: Oral  SpO2:  97%  Weight: 189 lb 4.8 oz (85.9 kg)  Height: 5' 2.75" (1.594 m)   Physical Exam Vitals and nursing note reviewed.  Constitutional:      Appearance: She is not ill-appearing or toxic-appearing.  HENT:     Head: Normocephalic.  Eyes:     Conjunctiva/sclera: Conjunctivae normal.  Cardiovascular:     Rate and Rhythm: Normal rate.     Pulses:          Radial pulses are 2+ on the right side and 2+ on the left side.  Pulmonary:     Effort: Pulmonary effort is normal.  Abdominal:     General: There is no distension.  Musculoskeletal:     Cervical back: Normal range of motion.     Comments: Compartments soft in BUE  Lymphadenopathy:     Cervical: No cervical adenopathy.     Upper Body:     Right upper body: Epitrochlear adenopathy present. No supraclavicular or axillary adenopathy.     Left upper body: Epitrochlear adenopathy present. No supraclavicular or axillary adenopathy.     Lower Body: No right inguinal adenopathy. No left inguinal adenopathy.  Skin:    General: Skin is warm and dry.  Neurological:     Mental Status: She is alert.        LABORATORY DATA: I have reviewed the data as listed    Latest Ref Rng & Units 08/08/2022   11:07 AM 05/14/2022    8:45 AM 03/22/2022   10:42 AM  CBC  WBC 4.0 - 10.5 K/uL 3.0  3.1  3.8   Hemoglobin 12.0 - 15.0 g/dL 52.8  41.3  24.4   Hematocrit 36.0 - 46.0 % 40.6  41.5  41.8   Platelets 150 - 400 K/uL 279  265  244         Latest Ref Rng & Units 08/08/2022   11:07 AM 05/14/2022    8:45 AM 04/25/2022   12:32 PM  CMP  Glucose 70 - 99 mg/dL 96  80  010   BUN 8 - 23 mg/dL 18  18  25    Creatinine 0.44 - 1.00 mg/dL 2.72  5.36  6.44   Sodium 135 - 145 mmol/L 139  137  138   Potassium 3.5 - 5.1 mmol/L 4.0  3.3  3.7   Chloride 98 - 111 mmol/L 102  99  93   CO2 22 - 32 mmol/L 31  29  32   Calcium 8.9 - 10.3 mg/dL 9.3  9.1  9.9   Total Protein 6.5 - 8.1 g/dL 6.5  6.7  6.7   Total Bilirubin 0.3 - 1.2 mg/dL 0.4  0.6  0.3   Alkaline Phos 38  - 126 U/L 83  80  79   AST 15 - 41 U/L 24  24  28    ALT 0 - 44 U/L 30  24  23         RADIOGRAPHIC STUDIES (from last 24 hours if applicable) I have personally reviewed the radiological images as listed and agreed with the findings in the report. No results found.      Visit Diagnosis: 1. 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 Other neutropenia (HCC)   3. Enlarged lymph nodes      Orders Placed This Encounter  Procedures   [place] LT UPPER EXTREM LTD SOFT TISSUE NON VASCULAR    Standing Status:   Future    Standing Expiration Date:   08/08/2023    Order Specific Question:   Reason for Exam (SYMPTOM  OR DIAGNOSIS REQUIRED)    Answer:   hx lymphoma in remission, new bilateral epitrochlear lymphadenopathy    Order Specific Question:   Preferred imaging location?    Answer:   [facility]   [place] RT UPPER EXTREM LTD SOFT TISSUE NON VASCULAR    Standing Status:   Future    Standing Expiration Date:   08/08/2023    Order Specific Question:   Reason for Exam (SYMPTOM  OR DIAGNOSIS REQUIRED)    Answer:   hx lymphoma in remission, new bilateral epitrochlear lymphadenopathy    Order Specific Question:   Preferred imaging location?    Answer:   [facility]    All questions were answered. The patient knows to call the clinic with any problems, questions or concerns. No barriers to learning was detected.  A total of more than 30 minutes were spent on this encounter with face-to-face time and non-face-to-face time, including preparing to see the patient, ordering tests and/or medications, counseling the patient and coordination of care as outlined above.    Thank you for allowing me to participate in the care of this patient.    Shanon Ace, PA-C Department of Hematology/Oncology [facility] Phone: [phone]  [phone]    08/08/2022 1:30 PM

## 2022-08-08 NOTE — Patient Instructions (Addendum)
  Once I have the results from the ultrasounds I will call to discuss them. If there are no concerning findings with your lymph nodes then we recommend following up with your primary care doctor for further evaluation.

## 2022-08-09 ENCOUNTER — Inpatient Hospital Stay: Payer: Medicare Other

## 2022-08-09 ENCOUNTER — Other Ambulatory Visit: Payer: Self-pay

## 2022-08-09 ENCOUNTER — Ambulatory Visit (HOSPITAL_COMMUNITY)
Admission: RE | Admit: 2022-08-09 | Discharge: 2022-08-09 | Disposition: A | Discharge status: Home / Self Care | Payer: Medicare Other | Source: Ambulatory Visit | Attending: Physician Assistant | Admitting: Physician Assistant

## 2022-08-09 DIAGNOSIS — C8318 Mantle cell lymphoma, lymph nodes of multiple sites: Secondary | ICD-10-CM | POA: Diagnosis present

## 2022-08-09 DIAGNOSIS — Z95828 Presence of other vascular implants and grafts: Secondary | ICD-10-CM

## 2022-08-09 MED ORDER — SODIUM CHLORIDE 0.9% FLUSH
10.0000 mL | INTRAVENOUS | Status: DC | PRN
  Administered 2022-08-09: 10 mL

## 2022-08-09 MED ORDER — HEPARIN SOD (PORK) LOCK FLUSH 100 UNIT/ML IV SOLN
500.0000 [IU] | Freq: Once | INTRAVENOUS | Status: AC | PRN
  Administered 2022-08-09: 500 [IU]

## 2022-08-14 ENCOUNTER — Telehealth: Payer: Self-pay

## 2022-08-14 NOTE — Telephone Encounter (Signed)
This RN called per request of Daphane Shepherd, PA-C in regards to her bilateral arm [place] results. Patient verbalized understanding that the results were normal and showed no mass or lymphadenopathy. Patient was advised to follow up with her PCP if she should continue to experience any discomfort. No questions or concerns from patient at this time.

## 2022-08-23 ENCOUNTER — Other Ambulatory Visit: Payer: Self-pay | Admitting: Nurse Practitioner

## 2022-08-23 DIAGNOSIS — J302 Other seasonal allergic rhinitis: Secondary | ICD-10-CM

## 2022-09-15 ENCOUNTER — Other Ambulatory Visit (HOSPITAL_COMMUNITY): Payer: Self-pay | Admitting: Internal Medicine

## 2022-09-15 ENCOUNTER — Other Ambulatory Visit: Payer: Self-pay | Admitting: Nurse Practitioner

## 2022-09-15 DIAGNOSIS — E039 Hypothyroidism, unspecified: Secondary | ICD-10-CM

## 2022-10-04 ENCOUNTER — Other Ambulatory Visit (HOSPITAL_COMMUNITY): Payer: Self-pay | Admitting: Internal Medicine

## 2022-10-04 ENCOUNTER — Other Ambulatory Visit (HOSPITAL_COMMUNITY): Payer: Self-pay

## 2022-10-04 ENCOUNTER — Telehealth (HOSPITAL_COMMUNITY): Payer: Self-pay | Admitting: Cardiology

## 2022-10-04 MED ORDER — TORSEMIDE 20 MG PO TABS
20.0000 mg | ORAL_TABLET | Freq: Every day | ORAL | 6 refills | Status: DC
  Filled 2022-10-04: qty 30, 30d supply, fill #0

## 2022-10-04 NOTE — Telephone Encounter (Signed)
Pt called to request refill of torsemide

## 2022-10-05 ENCOUNTER — Other Ambulatory Visit (HOSPITAL_COMMUNITY): Payer: Self-pay

## 2022-10-07 NOTE — Telephone Encounter (Signed)
Refill sent.

## 2022-10-29 NOTE — Progress Notes (Incomplete)
Advanced Heart Failure Clinic Note   Referring Physician: PCP: Lucky Cowboy, MD PCP-Cardiologist: Parke Poisson, MD  [facility]: Dr. Gala Romney   Reason for Visit:  F/u for Chronic Systolic Heart Failure  HPI:  ANNAELIZABETH RAISBECK is a 67 y.o. female with a hx of palpitations thought to be PVCs, HTN, non hodgkins lymphoma treated w/ R-CHOP and systolic HF.   Admitted 6/21 with acute HF. ECHO  EF 20-25% w/ mod-severe MR.  RV moderately reduced. Developed VT and was started on amiodarone and transferred to [facility] for [facility] which showed normal coronaries, elevated filling pressures w/ low CO c/w cardiogenic shock. CI was 1.7. She was started on milrinone, IV amiodarone and IV Lasix for diuresis.   Echo 9/21: EF 40-45%   On repeat echo 05/04/20 EF had normalized to 50-55% G2DD.    At oncology f/u 6/22,  repeat CT scan unfortunately showed evidence for disease progression with mild increase in the small bilateral axillary lymph nodes and moderate increase in the left supraclavicular as well as ileocolic mesenteric lymph nodes and left external iliac lymph nodes but mild increase in the periaortic retroperitoneal lymph nodes. She started treatment with acalabrutinib 100 mg p.o. twice daily on June 17 2020 and Dr. Arbutus Ped referred her to [facility] for bone marrow transplant evaluation. As part of w/u at [facility], an echocardiogram was obtained and showed drop in LVEF back down to 40% w/ echodensity vs thickening of the left ventricular apex concerning for possible LV thrombus. Subsequently, she was referred back to [facility] for further evaluation.    Had follow up 10/22, and endorsed stable NYHA II symptoms. Losartan was increased and cMRI arranged to assess for LV thrombus.    cMRI 11/22 LVEF 39%, no LV thrombus noted.   Follow up 11/22 Not a candidate for stem cell transplant, but will trial CAR-T therapy soon.    Recently d/c from [facility] after CAR-T therapy, had cytokine storm, taken off all HF meds due to low BP.  Echo 1/23 with EF 40-45%.    Admitted from HF clinic on 03/07/21 with acute on chronic systolic CHF with low-output. Bedside echo with EF < 20%. PICC line placed. Initial co-ox 34% and she was started on inotrope support with milrinone. Diuresed with IV lasix then transitioned to po torsemide. She failed initial milrinone wean then was eventually titrated off milrinone with stable co-ox. Torsemide decreased to 20 mg daily (starting 03/15) at discharge d/t AKI and low volume. Scr 1.24>>1.64 (baseline 1.0). She was started on GDMT. Had frequent PVCs and runs of NSVT which improved with addition of po amiodarone. Course also complicated by left brachial DVT for which she was started on Eliquis. Discharged on 3/13. D/c wt 172 lb.   At post hospital f/u, she was doing well. Wt only up 2 lb since d/c, 172>>174 lb. ReDs Clip 32%. BP stable, 124/78. EKG showed NSR. No PVCs. NYHA class II. However, her labs returned abnormal. Digoxin level was elevated at 2.5 (EKG ok), Scr had risen further from 1.64>>1.73. She was instructed to stop Digoxin and to hold torsemide x 1 day, then reduced frequency to qod dosing. She had repeat labs on 3/20 which showed Dig level down to 0.5.  SCr back down to 1.6 w/ low K at 3.1. She was instructed to remain off dig and spiro increased to 25 mg daily.   Per Dr. Asa Lente note, she remains on observation w/ plans to continue monthly blood work and f/u w/ him in 2 months.  Echo 3/23 EF 20-25%  Echo 06/29/21 EF 35-40% (read as 30-35%) severe MR  TEE 07/04/21 EF 20-25% severe MR moderate  She returns back today for f/u. Feeling pretty good. Denies CP or SOB. Off midodrine. Takes torsemide every day (sometimes just takes 1/2 pill). Able to do ADLs without problem but gets fatigued if she tries to do more.     Cardiac/Vascular Studies: Echo 06/2019 EF 20-25%  moderately reduced RV function with severe MR/TR Echo 9/21: EF 40-45%, RV normal  Echo  4/22: EF 50-55% G2DD. RV normal  Echo  [facility] 10/04/20: EF 40%, grade I DD, RV ok, mild MR, mild LAE, small circumferential pericardial effusion, Echodensity vs thickening of the left ventricular apex, cannot rule out thrombus Echo 3/23 LVEF 20-25%, RV mildly reduced. Small pericardial effusion, no tamponade    cMRI 11/22 EF 39%, no LV thrombus   Cath 6/18 with normal cors. Low output. Ao = 109/71 (81) LV =  99/23 RA =  15 RV =  43/18 PA =  35/14 (25) PCW = 22 Fick cardiac output/index = 3.3/1.7 PVR = < 1.0 WU Ao sat = 100% PA sat = 49%, 53%    Assessment: 1. Normal coronary arteries 2. Severe NICM EF 20% 3. Elevated filling pressures and low CO c/w cardiogenic shock 4. Brief run of VT   Venous duplex LUE, 03/13/21: Summary:  Right:  No evidence of thrombosis in the subclavian.     Left:  Findings consistent with partial age indeterminate deep vein thrombosis  involving the left brachial veins.   Review of systems complete and found to be negative unless listed in HPI.     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 45 tablet 3   apixaban (ELIQUIS) 5 MG TABS tablet Take 1 tablet (5 mg total) by mouth 2 (two) times daily. 60 tablet 6   Cholecalciferol (VITAMIN D-3) 25 MCG (1000 UT) CAPS Take 1 capsule by mouth daily.     CORLANOR 7.5 MG TABS tablet TAKE 1 TABLET BY MOUTH TWICE A DAY WITH FOOD 60 tablet 3   estradiol (VIVELLE-DOT) 0.05 MG/24HR patch Place 1 patch onto the skin 2 (two) times a week. Tuesdays & Fridays     FARXIGA 10 MG TABS  tablet TAKE 1 TABLET BY MOUTH EVERY DAY 30 tablet 11   fluticasone (FLONASE) 50 MCG/ACT nasal spray Place 1 spray into both nostrils as needed for allergies or rhinitis.     ipratropium (ATROVENT) 0.03 % nasal spray PLACE 2 SPRAYS INTO THE NOSE 4 (FOUR) TIMES DAILY. 90 mL 1   levothyroxine (SYNTHROID) 100 MCG tablet TAKE 1 TABLET BY MOUTH DAILY ON AN EMPTY STOMACH WITH ONLY WATER FOR 30 MINUTES & NO ANTACID/CALCIUM/MAGNESIUM FOR 4 HOURS & AVOID BIOTIN 90 tablet 1   losartan (COZAAR) 25 MG tablet Take 1 tablet (25 mg total) by mouth at bedtime. 90 tablet 3  ondansetron (ZOFRAN) 8 MG tablet Take 8 mg by mouth every 8 (eight) hours as needed for vomiting or nausea.     ondansetron (ZOFRAN-ODT) 4 MG disintegrating tablet Take 1 tablet (4 mg total) by mouth every 8 (eight) hours as needed for nausea or vomiting. 30 tablet 3   potassium chloride SA (KLOR-CON M) 20 MEQ tablet Take 1 tablet (20 mEq total) by mouth daily. 30 tablet 6   prochlorperazine (COMPAZINE) 10 MG tablet TAKE 1 TABLET BY MOUTH EVERY 6 HOURS AS NEEDED FOR NAUSEA OR VOMITING. 30 tablet 0   rosuvastatin (CRESTOR) 10 MG tablet Take 1 tablet (10 mg total) by mouth daily. (Patient not taking: Reported on 01/31/2022) 90 tablet 3   spironolactone (ALDACTONE) 25 MG tablet TAKE 1 TABLET BY MOUTH EVERY DAY 90 tablet 3   sulfamethoxazole-trimethoprim (BACTRIM DS) 800-160 MG tablet Take 1 tablet by mouth See admin instructions. Take 1 tablet twice daily on Saturdays and Sundays ONLY     torsemide (DEMADEX) 20 MG tablet Take 1 tablet (20 mg total) by mouth daily. 30 tablet 6   valACYclovir (VALTREX) 500 MG tablet Take 500 mg by mouth 2 (two) times daily.     No current facility-administered medications for this visit.    Allergies  Allergen Reactions   Adriamycin [Doxorubicin] Other (See Comments)    Acute heart failure (chemo from Jan - May 2021)   Ppd [Tuberculin Purified Protein Derivative] Swelling    Positive reaction.  Negative Chest  Xray 06/16/12      Social History   Socioeconomic History   Marital status: Married    Spouse name: Not on file   Number of children: 1   Years of education: Not on file   Highest education level: Not on file  Occupational History   Occupation: Marine scientist  Tobacco Use   Smoking status: Former    Current packs/day: 0.00    Types: Cigarettes    Start date: 01/07/1981    Quit date: 01/08/1996    Years since quitting: 26.8   Smokeless tobacco: Never   Tobacco comments:    quit 1998 but is exposed to 2nd hand smoke  Vaping Use   Vaping status: Never Used  Substance and Sexual Activity   Alcohol use: No   Drug use: No   Sexual activity: Yes    Partners: Male    Birth control/protection: Post-menopausal  Other Topics Concern   Not on file  Social History Narrative   Married, one daughter, employed as an Conservator, museum/gallery 2 caffeinated drinks per day   Social Determinants of Health   Financial Resource Strain: Low Risk  (03/22/2021)   Overall Financial Resource Strain (CARDIA)    Difficulty of Paying Living Expenses: Not very hard  Food Insecurity: No Food Insecurity (03/22/2021)   Hunger Vital Sign    Worried About Running Out of Food in the Last Year: Never true    Ran Out of Food in the Last Year: Never true  Transportation Needs: No Transportation Needs (03/22/2021)   PRAPARE - Administrator, Civil Service (Medical): No    Lack of Transportation (Non-Medical): No  Physical Activity: Inactive (08/13/2018)   Exercise Vital Sign    Days of Exercise per Week: 0 days    Minutes of Exercise per Session: 0 min  Stress: No Stress Concern Present (08/13/2018)   [employer] of Occupational Health - Occupational Stress Questionnaire    Feeling of Stress : Only a little  Social Connections:  Moderately Integrated (07/02/2019)   Social Connection and Isolation Panel [NHANES]    Frequency of Communication with Friends and Family: More than three times a week     Frequency of Social Gatherings with Friends and Family: Once a week    Attends Religious Services: More than 4 times per year    Active Member of [employer] or Organizations: No    Attends Banker Meetings: Never    Marital Status: Married  Catering manager Violence: Not on file      Family History  Problem Relation Age of Onset   Heart disease Mother 62       chf   Diabetes Mother    Thyroid disease Mother    Cancer Mother 70       female/ skin   Hypertension Mother    Liver disease Mother    Hypertension Father    Alcohol abuse Father    Heart disease Father    Breast cancer Sister 46   Diabetes Sister    Hypothyroidism Sister    Hashimoto's thyroiditis Sister    Hyperlipidemia Brother    Hypertension Brother        x 2   Diabetes Brother    Hypothyroidism Brother    Prostate cancer Brother 73   Liver disease Maternal Grandmother    Diabetes Paternal Grandmother    Breast cancer Maternal Aunt    Hyperlipidemia Maternal Aunt    Heart disease Maternal Aunt    Bone cancer Maternal Aunt        metastatic   Breast cancer Niece 25   Colon cancer Neg Hx    Colon polyps Neg Hx    Esophageal cancer Neg Hx    Rectal cancer Neg Hx    Stomach cancer Neg Hx     There were no vitals filed for this visit.   PHYSICAL EXAM: General:  Well appearing. No resp difficulty HEENT: normal Neck: supple. no JVD. Carotids 2+ bilat; no bruits. No lymphadenopathy or thryomegaly appreciated. Cor: PMI nondisplaced. Regular rate & rhythm. No rubs, gallops or murmurs. Lungs: clear Abdomen: obese soft, nontender, nondistended. No hepatosplenomegaly. No bruits or masses. Good bowel sounds. Extremities: no cyanosis, clubbing, rash, tr edema Neuro: alert & orientedx3, cranial nerves grossly intact. moves all 4 extremities w/o difficulty. Affect pleasant  NSR 68 1AVB 200 ms Personally reviewed   ASSESSMENT & PLAN:  1. Chronic Systolic HF  - Echo 1/21 EF 55-60%. RV normal - Echo  6/21 EF 20-25%  moderately reduced RV function with severe MR/TR - Cath 06/25/19 no CAD. - Drop in EF felt most likely from R-CHOP - Echo 05/04/20 w/ EF 50-55% G2DD. RV normal - Echo 9/22 at [facility] EF back down to 40% w/ ? LV thrombus - cMRI 11/22 LVEF 39%, no LV clot - Echo 1/23 [facility]): EF 40-45% - Echo 03/08/21: EF 20-25%, RV mildly reduced, RVSP 44 mmHg - Direct admit from clinic 03/07/21  Repeat echo, EF 20%.  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 Required milrinone for low-output.  - Echo 06/29/21 EF improved 35-40% (read as 30-35%) with new severe MR - TEE 07/04/21 EF 20-25% severe MR moderate - Stable NYHA II-III. Volume stable on torsemide 20 qd - Continue farxiga 10 mg daily - Off midodrine with improved BP - Will attempt to restart losartan 25 qhs to see if she can tolerate - Off Digoxin due to elevated level  - Continue spiro 25 mg daily.  - Continue ivabradine 7.5 mg twice a day.  -  Due for echo ASAP  -> ordered  2. H/o VT + PVCs - No palpitations RRR on exam   - Decrease amio to 100 daily - Check amio labs  3. Severe MR  - likely functional - TEE 07/04/21 EF 20-25% severe MR moderate - repeat echo to reassess   4. Stage IV non-Hodgkin's lymphoma, mantle cell lymphoma   - s/p R-CHOP w/ Neulasta. - Evidence of disease progression on f/u Chest CT 6/22. - s/p CAR-T therapy. - Followed at [facility]  5. Partial Lt Brachial DVT (provoked) - Diagnosed 03/13/21 - on Eliquis. Denies abnormal bleeding    6. HL - LDL 155  - Continue crestor 10    7. HRT - ok to continue from cardiology standpoint as no significant drug interaction and previous DVT was provoked - d/w HF pharmD   Jacklynn Ganong, FNP 10/29/22

## 2022-10-30 ENCOUNTER — Ambulatory Visit (HOSPITAL_COMMUNITY)
Admission: RE | Admit: 2022-10-30 | Discharge: 2022-10-30 | Disposition: A | Discharge status: Home / Self Care | Payer: Medicare Other | Source: Ambulatory Visit | Attending: Family Medicine | Admitting: Family Medicine

## 2022-10-30 ENCOUNTER — Encounter (HOSPITAL_COMMUNITY): Payer: Self-pay

## 2022-10-30 ENCOUNTER — Other Ambulatory Visit: Payer: Self-pay

## 2022-10-30 ENCOUNTER — Other Ambulatory Visit (HOSPITAL_COMMUNITY): Payer: Self-pay

## 2022-10-30 VITALS — BP 120/80 | HR 71 | Ht 62.0 in | Wt 190.2 lb

## 2022-10-30 DIAGNOSIS — I472 Ventricular tachycardia, unspecified: Secondary | ICD-10-CM | POA: Insufficient documentation

## 2022-10-30 DIAGNOSIS — I5022 Chronic systolic (congestive) heart failure: Secondary | ICD-10-CM | POA: Diagnosis not present

## 2022-10-30 DIAGNOSIS — I493 Ventricular premature depolarization: Secondary | ICD-10-CM | POA: Insufficient documentation

## 2022-10-30 DIAGNOSIS — I340 Nonrheumatic mitral (valve) insufficiency: Secondary | ICD-10-CM

## 2022-10-30 DIAGNOSIS — I82622 Acute embolism and thrombosis of deep veins of left upper extremity: Secondary | ICD-10-CM | POA: Diagnosis not present

## 2022-10-30 DIAGNOSIS — Z79899 Other long term (current) drug therapy: Secondary | ICD-10-CM | POA: Diagnosis not present

## 2022-10-30 DIAGNOSIS — Z79890 Hormone replacement therapy (postmenopausal): Secondary | ICD-10-CM | POA: Diagnosis not present

## 2022-10-30 DIAGNOSIS — C8316 Mantle cell lymphoma, intrapelvic lymph nodes: Secondary | ICD-10-CM

## 2022-10-30 DIAGNOSIS — E782 Mixed hyperlipidemia: Secondary | ICD-10-CM

## 2022-10-30 DIAGNOSIS — Z7901 Long term (current) use of anticoagulants: Secondary | ICD-10-CM | POA: Diagnosis not present

## 2022-10-30 DIAGNOSIS — C8310 Mantle cell lymphoma, unspecified site: Secondary | ICD-10-CM | POA: Diagnosis not present

## 2022-10-30 DIAGNOSIS — Z86718 Personal history of other venous thrombosis and embolism: Secondary | ICD-10-CM

## 2022-10-30 LAB — BASIC METABOLIC PANEL
Anion gap: 10 (ref 5–15)
BUN: 20 mg/dL (ref 8–23)
CO2: 29 mmol/L (ref 22–32)
Calcium: 9.4 mg/dL (ref 8.9–10.3)
Chloride: 98 mmol/L (ref 98–111)
Creatinine, Ser: 1.32 mg/dL — ABNORMAL HIGH (ref 0.44–1.00)
GFR, Estimated: 45 mL/min — ABNORMAL LOW (ref >60)
Glucose, Bld: 96 mg/dL (ref 70–99)
Potassium: 4.2 mmol/L (ref 3.5–5.1)
Sodium: 137 mmol/L (ref 135–145)

## 2022-10-30 LAB — CBC
HCT: 41.5 % (ref 36.0–46.0)
Hemoglobin: 12.8 g/dL (ref 12.0–15.0)
MCH: 26.3 pg (ref 26.0–34.0)
MCHC: 30.8 g/dL (ref 30.0–36.0)
MCV: 85.4 fL (ref 80.0–100.0)
Platelets: 282 10*3/uL (ref 150–400)
RBC: 4.86 MIL/uL (ref 3.87–5.11)
RDW: 15.9 % — ABNORMAL HIGH (ref 11.5–15.5)
WBC: 3.2 10*3/uL — ABNORMAL LOW (ref 4.0–10.5)
nRBC: 0 % (ref 0.0–0.2)

## 2022-10-30 LAB — BRAIN NATRIURETIC PEPTIDE: B Natriuretic Peptide: 503.6 pg/mL — ABNORMAL HIGH (ref 0.0–100.0)

## 2022-10-30 MED ORDER — LOSARTAN POTASSIUM 25 MG PO TABS
12.5000 mg | ORAL_TABLET | Freq: Every day | ORAL | 3 refills | Status: DC
  Filled 2022-10-30: qty 45, 90d supply, fill #0
  Filled 2023-02-06: qty 45, 90d supply, fill #1
  Filled 2023-04-21: qty 45, 90d supply, fill #2

## 2022-10-30 NOTE — Progress Notes (Signed)
ReDS Vest / Clip - 10/30/22 1334       ReDS Vest / Clip   Station Marker B    Ruler Value 33    ReDS Value Range Low volume    ReDS Actual Value 34

## 2022-10-30 NOTE — Patient Instructions (Signed)
Re-start Losartan 12.5 mg daily. Labs today - will call you if abnormal. Repeat lab 11/12/22 when you see Dr. Arbutus Ped. Return to see Dr. Gala Romney in 4 months. Please call us at [phone] in January to schedule this appointment. Please call us at [phone] if any questions or concerns prior to your next visit.

## 2022-11-04 ENCOUNTER — Other Ambulatory Visit: Payer: Self-pay

## 2022-11-04 ENCOUNTER — Telehealth (HOSPITAL_COMMUNITY): Payer: Self-pay

## 2022-11-04 ENCOUNTER — Other Ambulatory Visit (HOSPITAL_COMMUNITY): Payer: Self-pay

## 2022-11-04 MED ORDER — POTASSIUM CHLORIDE CRYS ER 20 MEQ PO TBCR
40.0000 meq | EXTENDED_RELEASE_TABLET | Freq: Every day | ORAL | 6 refills | Status: DC
  Filled 2022-11-04: qty 30, 15d supply, fill #0
  Filled 2022-12-25: qty 30, 15d supply, fill #1

## 2022-11-04 MED ORDER — TORSEMIDE 20 MG PO TABS
40.0000 mg | ORAL_TABLET | Freq: Every day | ORAL | 11 refills | Status: DC
  Filled 2022-11-04: qty 60, 30d supply, fill #0
  Filled 2022-12-09: qty 60, 30d supply, fill #1
  Filled 2023-01-13 – 2023-01-28 (×6): qty 60, 30d supply, fill #2
  Filled 2023-02-25: qty 60, 30d supply, fill #3
  Filled 2023-04-02 – 2023-04-21 (×2): qty 60, 30d supply, fill #4

## 2022-11-04 NOTE — Telephone Encounter (Addendum)
Pt aware, agreeable, and verbalized understanding   Med list updated   ----- Message from Jacklynn Ganong sent at 11/01/2022  8:12 AM EDT ----- BNP creeping up, suggesting volume up.  Increase torsemide to 40 mg daily, add 20 KCL.  She has repeat labs arranged

## 2022-11-12 ENCOUNTER — Inpatient Hospital Stay: Payer: Medicare Other | Attending: Internal Medicine | Admitting: Internal Medicine

## 2022-11-12 ENCOUNTER — Other Ambulatory Visit: Payer: Self-pay

## 2022-11-12 ENCOUNTER — Other Ambulatory Visit: Payer: Medicare Other

## 2022-11-12 ENCOUNTER — Inpatient Hospital Stay: Payer: Medicare Other

## 2022-11-12 VITALS — BP 109/42 | HR 64 | Temp 97.5°F | Resp 16 | Ht 62.0 in | Wt 189.3 lb

## 2022-11-12 DIAGNOSIS — R110 Nausea: Secondary | ICD-10-CM | POA: Diagnosis not present

## 2022-11-12 DIAGNOSIS — C8313 Mantle cell lymphoma, intra-abdominal lymph nodes: Secondary | ICD-10-CM | POA: Diagnosis not present

## 2022-11-12 DIAGNOSIS — Z8572 Personal history of non-Hodgkin lymphomas: Secondary | ICD-10-CM | POA: Diagnosis present

## 2022-11-12 DIAGNOSIS — Z08 Encounter for follow-up examination after completed treatment for malignant neoplasm: Secondary | ICD-10-CM | POA: Diagnosis present

## 2022-11-12 DIAGNOSIS — Z452 Encounter for adjustment and management of vascular access device: Secondary | ICD-10-CM | POA: Diagnosis not present

## 2022-11-12 DIAGNOSIS — I509 Heart failure, unspecified: Secondary | ICD-10-CM | POA: Insufficient documentation

## 2022-11-12 DIAGNOSIS — Z95828 Presence of other vascular implants and grafts: Secondary | ICD-10-CM

## 2022-11-12 LAB — CBC WITH DIFFERENTIAL (CANCER CENTER ONLY)
Abs Immature Granulocytes: 0.15 10*3/uL — ABNORMAL HIGH (ref 0.00–0.07)
Basophils Absolute: 0.1 10*3/uL (ref 0.0–0.1)
Basophils Relative: 1 %
Eosinophils Absolute: 0 10*3/uL (ref 0.0–0.5)
Eosinophils Relative: 1 %
HCT: 41.4 % (ref 36.0–46.0)
Hemoglobin: 13.4 g/dL (ref 12.0–15.0)
Immature Granulocytes: 4 %
Lymphocytes Relative: 11 %
Lymphs Abs: 0.4 10*3/uL — ABNORMAL LOW (ref 0.7–4.0)
MCH: 27.2 pg (ref 26.0–34.0)
MCHC: 32.4 g/dL (ref 30.0–36.0)
MCV: 84.1 fL (ref 80.0–100.0)
Monocytes Absolute: 0.6 10*3/uL (ref 0.1–1.0)
Monocytes Relative: 15 %
Neutro Abs: 2.7 10*3/uL (ref 1.7–7.7)
Neutrophils Relative %: 68 %
Platelet Count: 274 10*3/uL (ref 150–400)
RBC: 4.92 MIL/uL (ref 3.87–5.11)
RDW: 16.1 % — ABNORMAL HIGH (ref 11.5–15.5)
WBC Count: 3.9 10*3/uL — ABNORMAL LOW (ref 4.0–10.5)
nRBC: 0 % (ref 0.0–0.2)

## 2022-11-12 LAB — CMP (CANCER CENTER ONLY)
ALT: 21 U/L (ref 0–44)
AST: 24 U/L (ref 15–41)
Albumin: 4.5 g/dL (ref 3.5–5.0)
Alkaline Phosphatase: 89 U/L (ref 38–126)
Anion gap: 9 (ref 5–15)
BUN: 24 mg/dL — ABNORMAL HIGH (ref 8–23)
CO2: 33 mmol/L — ABNORMAL HIGH (ref 22–32)
Calcium: 9.4 mg/dL (ref 8.9–10.3)
Chloride: 95 mmol/L — ABNORMAL LOW (ref 98–111)
Creatinine: 1.34 mg/dL — ABNORMAL HIGH (ref 0.44–1.00)
GFR, Estimated: 43 mL/min — ABNORMAL LOW (ref >60)
Glucose, Bld: 88 mg/dL (ref 70–99)
Potassium: 3.3 mmol/L — ABNORMAL LOW (ref 3.5–5.1)
Sodium: 137 mmol/L (ref 135–145)
Total Bilirubin: 0.5 mg/dL (ref <1.2)
Total Protein: 6.9 g/dL (ref 6.5–8.1)

## 2022-11-12 LAB — LACTATE DEHYDROGENASE: LDH: 253 U/L — ABNORMAL HIGH (ref 98–192)

## 2022-11-12 MED ORDER — SODIUM CHLORIDE 0.9% FLUSH
10.0000 mL | INTRAVENOUS | Status: DC | PRN
  Administered 2022-11-12: 10 mL

## 2022-11-12 MED ORDER — HEPARIN SOD (PORK) LOCK FLUSH 100 UNIT/ML IV SOLN
500.0000 [IU] | Freq: Once | INTRAVENOUS | Status: AC | PRN
  Administered 2022-11-12: 500 [IU]

## 2022-11-12 NOTE — Progress Notes (Signed)
[facility] [phone]   [phone]  OFFICE PROGRESS NOTE  Lucky Cowboy, MD [address] [place] [address]  DIAGNOSIS: Stage IV non-Hodgkin's lymphoma, mantle cell lymphoma.  She presented with left iliac and retroperitoneal lymphadenopathy.  She also presented with left axillary lymphadenopathy and bone marrow involvement.  She was diagnosed in December 2020.   PRIOR THERAPY:  1) Systemic chemotherapy with R-CHOP with Neulasta support.  First dose on 01/27/2019. Status post 6 cycles.   2) Acalabrutinib 100 mg p.o. twice daily.  First dose started June 17, 2020.  Status post 3 months of treatment. 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 female returns to the clinic today for follow-up visit and her daughter Archie Patten was available by phone during the visit. Discussed the use of AI scribe software for clinical note transcription with the patient, who gave verbal consent to proceed.  History of Present Illness   The patient, a 67 year old individual with a history of stage 4 mantle cell lymphoma, was diagnosed in December 2020. She underwent chemotherapy with R-CHOP, followed by treatment with acalabrutinib, and received CAR T therapy in January 2023. The patient reports no significant health complaints, except for occasional aches and pains, which she attributes to age.  She has noticed small lumps in both arms,. However, she denies any other symptoms of lymphadenopathy. The patient's weight has been fluctuating, but she denies any unintentional weight loss. She also denies experiencing night  sweats.  The patient does report occasional nausea, which she believes is due to the absence of her gallbladder. She has medication for this but rarely takes it, as the nausea tends to pass after a while. She denies any chest pain, breathing issues, or headaches.  The patient also has a history of congestive heart failure, which developed as a side effect of one of her cancer treatments. She recently saw her cardiologist, who increased her fluid medication and potassium due to the heart's inability to get rid of fluid effectively.  The patient's recent lab results show an improvement in her white blood cell count, from 3.0 in August to 3.9 currently. Her hemoglobin, hematocrit, and platelet counts are all within normal ranges. However, her potassium is slightly low, and her kidney function is slightly elevated, which may be due to the diuretics she is taking for her heart condition.  The patient's daughter inquires about the possibility of discontinuing the patient's antibiotic and antiviral medications, given the improvement in her white blood cell count. The patient has been on these medications for almost two years following her CAR T therapy. The patient and her daughter plan to consult with the CAR T team regarding this matter.       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  MEDICATIONS:  Current Outpatient Medications  Medication Sig Dispense Refill   amiodarone (PACERONE) 200 MG tablet Take 0.5 tablets (100 mg total) by mouth daily. 45 tablet  3   apixaban (ELIQUIS) 5 MG TABS tablet Take 1 tablet (5 mg total) by mouth 2 (two) times daily. 60 tablet 6   Cholecalciferol (VITAMIN D-3) 25 MCG (1000 UT) CAPS Take 1 capsule by mouth daily.     CORLANOR 7.5 MG TABS tablet TAKE 1 TABLET BY MOUTH TWICE A DAY WITH FOOD 60 tablet 3   estradiol (VIVELLE-DOT) 0.05 MG/24HR patch Place 1 patch onto the skin 2 (two) times a week. Tuesdays & Fridays     FARXIGA 10 MG TABS tablet TAKE 1 TABLET BY MOUTH EVERY DAY 30 tablet 11   fluticasone (FLONASE) 50 MCG/ACT nasal spray Place 1 spray into both nostrils as needed for allergies or rhinitis.     ipratropium (ATROVENT) 0.03 % nasal spray PLACE 2 SPRAYS INTO THE NOSE 4 (FOUR) TIMES DAILY. 90 mL 1   levothyroxine (SYNTHROID) 100 MCG tablet TAKE 1 TABLET BY MOUTH DAILY ON AN EMPTY STOMACH WITH ONLY WATER FOR 30 MINUTES & NO ANTACID/CALCIUM/MAGNESIUM FOR 4 HOURS & AVOID BIOTIN 90 tablet 1   losartan (COZAAR) 25 MG tablet Take 0.5 tablets (12.5 mg total) by mouth daily. 45 tablet 3   ondansetron (ZOFRAN-ODT) 4 MG disintegrating tablet Take 1 tablet (4 mg total) by mouth every 8 (eight) hours as needed for nausea or vomiting. 30 tablet 3   potassium chloride SA (KLOR-CON M) 20 MEQ tablet Take 2 tablets (40 mEq total) by mouth daily. 30 tablet 6   prochlorperazine (COMPAZINE) 10 MG tablet TAKE 1 TABLET BY MOUTH EVERY 6 HOURS AS NEEDED FOR NAUSEA OR VOMITING. 30 tablet 0   rosuvastatin (CRESTOR) 10 MG tablet Take 1 tablet (10 mg total) by mouth daily. (Patient not taking: Reported on 01/31/2022) 90 tablet 3   spironolactone (ALDACTONE) 25 MG tablet TAKE 1 TABLET BY MOUTH EVERY DAY 90 tablet 3   sulfamethoxazole-trimethoprim (BACTRIM DS) 800-160 MG tablet Take 1 tablet by mouth See admin instructions. Take 1 tablet twice daily on Saturdays and Sundays ONLY     torsemide (DEMADEX) 20 MG tablet Take 2 tablets (40 mg total) by mouth daily. 60 tablet 11   valACYclovir (VALTREX) 500 MG tablet Take 500 mg by mouth 2 (two)  times daily.     No current facility-administered medications for this visit.   Facility-Administered Medications Ordered in Other Visits  Medication Dose Route Frequency Provider Last Rate Last Admin   sodium chloride flush (NS) 0.9 % injection 10 mL  10 mL Intracatheter PRN Elier Zellars, MD   10 mL at 11/12/22 1312    SURGICAL HISTORY:  Past Surgical History:  Procedure Laterality Date   ABDOMINAL HYSTERECTOMY  1997   CESAREAN SECTION  1983   CHOLECYSTECTOMY N/A 09/25/2020   Procedure: LAPAROSCOPIC CHOLECYSTECTOMY;  Surgeon: Kinsinger, Luke Aaron, MD;  Location: [facility];  Service: General;  Laterality: N/A;  60 MINUTES   COLONOSCOPY W/ BIOPSIES  12/29/2006   IR IMAGING GUIDED PORT INSERTION  01/15/2019   LAPAROSCOPY     19 91 for emdometriosis   RIGHT/LEFT HEART CATH AND CORONARY ANGIOGRAPHY N/A 06/25/2019   Procedure: RIGHT/LEFT HEART CATH AND CORONARY ANGIOGRAPHY;  Surgeon: Dolores Patty, MD;  Location: [facility] INVASIVE CV [facility];  Service: Cardiovascular;  Laterality: N/A;   TEE WITHOUT CARDIOVERSION N/A 07/04/2021   Procedure: TRANSESOPHAGEAL ECHOCARDIOGRAM (TEE);  Surgeon: Dolores Patty, MD;  Location: [facility] ENDOSCOPY;  Service: Cardiovascular;  Laterality: N/A;   UPPER GASTROINTESTINAL ENDOSCOPY      REVIEW OF SYSTEMS:  A comprehensive review of systems was negative.   PHYSICAL EXAMINATION: General appearance: alert, cooperative, and no distress Head: Normocephalic, without obvious abnormality, atraumatic Neck: no adenopathy, no JVD, supple, symmetrical, trachea midline, and thyroid not enlarged, symmetric, no tenderness/mass/nodules Lymph nodes: Cervical, supraclavicular, and axillary nodes normal. Resp: clear to auscultation bilaterally Back: symmetric, no curvature. ROM normal. No CVA tenderness. Cardio: regular rate and rhythm, S1, S2 normal, no murmur, click, rub or gallop GI: soft, non-tender; bowel sounds normal; no masses,  no organomegaly Extremities: extremities  normal, atraumatic, no cyanosis or edema  ECOG PERFORMANCE STATUS: 1 - Symptomatic but completely ambulatory  Blood pressure (!) 109/42, pulse 64, temperature (!) 97.5 F (36.4 C), temperature source Oral, resp. rate 16, height 5\' 2"  (1.575 m), weight 189 lb 4.8 oz (85.9 kg), SpO2 98%.  LABORATORY DATA: Lab Results  Component Value Date   WBC 3.2 (L) 10/30/2022   HGB 12.8 10/30/2022   HCT 41.5 10/30/2022   MCV 85.4 10/30/2022   PLT 282 10/30/2022      Chemistry      Component Value Date/Time   NA 137 10/30/2022 1431   K 4.2 10/30/2022 1431   CL 98 10/30/2022 1431   CO2 29 10/30/2022 1431   BUN 20 10/30/2022 1431   CREATININE 1.32 (H) 10/30/2022 1431   CREATININE 1.10 (H) 08/08/2022 1107   CREATININE 1.03 12/12/2020 0957      Component Value Date/Time   CALCIUM 9.4 10/30/2022 1431   ALKPHOS 83 08/08/2022 1107   AST 24 08/08/2022 1107   ALT 30 08/08/2022 1107   BILITOT 0.4 08/08/2022 1107       RADIOGRAPHIC STUDIES: No results found.   ASSESSMENT AND PLAN: This is a very pleasant 67 years old white female recently diagnosed with stage IV less aggressive mantle cell lymphoma presented with left iliac and retroperitoneal lymphadenopathy as well as left axillary and bone marrow involvement in December 2020. The patient completed 6 cycles of systemic chemotherapy with R-CHOP.  She tolerated her treatment fairly well with no concerning adverse effects but few months later she developed congestive heart failure with drop in her ejection fraction to the 20-25%.  This has improved over the last few months and currently around 55%.  She is followed by Dr. Gala Romney. Unfortunately repeat CT scan in June 2022 showed evidence for disease progression with mild increase in the small bilateral axillary lymph nodes and moderate increase in the left supraclavicular as well as ileocolic mesenteric lymph nodes and left external iliac lymph nodes but mild increase in the periaortic  retroperitoneal lymph nodes. The patient is started treatment with acalabrutinib 100 mg p.o. twice daily on June 17, 2020  She was [facility] by [facility] for discussion of potential autologous stem cell transplant.   The patient had repeat CT scan of the chest, abdomen pelvis performed recently that showed decrease in the size of mesenteric lymph node/mass of the right hemiabdomen, left external iliac lymph nodes and lymph nodes adjacent to the pancreatic tail/splenic hilum but there was mild increase in the size of the mediastinal paraesophageal nodes and retroperitoneal periaortic lymph node as well as increase in the size of the spleen. The  patient underwent CAR-T with Tecartus on January 23, 2021 after lymphoid ablation with fludarabine and Cytoxan.  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 The patient has been in observation since that time and she is feeling fine with no concerning complaints.    Mantle Cell Lymphoma (Stage 4) Stable since CAR T-cell therapy in January 2023. No new symptoms or signs of disease progression. Noted palpable lymph nodes in arms, but unclear if related to lymphoma. -Plan for imaging (chest, abdomen, pelvis) in 6 months to assess disease status.  Congestive Heart Failure Managed with diuretics and potassium supplementation. Recent increase in diuretic dose due to fluid retention. -Continue current medications and hydration. -Continue monitoring by cardiologist.  Prophylactic Antibiotics and Antivirals Continued use due to history of CAR T-cell therapy and previous low white blood cell count. Current white blood cell count improving and nearing normal range. -Consult with CAR T-cell therapy team regarding duration of prophylactic treatment.  Nausea Possible relation to gallbladder removal. Patient has medication but does not regularly use  it. -Continue current management strategy as nausea is self-limiting.  Follow-up -Return in 6 months for routine follow-up. -Order labs and imaging 1 week prior to next visit. -Encourage patient to contact office with any new or worsening symptoms.    For the insomnia, I will give her a refill of Restoril 15 mg p.o. nightly. She was advised to call immediately if she has any other concerning symptoms in the interval. The patient voices understanding of current disease status and treatment options and is in agreement with the current care plan.  All questions were answered. The patient knows to call the clinic with any problems, questions or concerns. We can certainly see the patient much sooner if necessary.  Disclaimer: This note was dictated with voice recognition software. Similar sounding words can inadvertently be transcribed and may not be corrected upon review.

## 2022-11-21 ENCOUNTER — Other Ambulatory Visit (HOSPITAL_COMMUNITY): Payer: Self-pay | Admitting: Internal Medicine

## 2022-11-28 ENCOUNTER — Encounter: Payer: Self-pay | Admitting: Internal Medicine

## 2022-11-28 NOTE — Telephone Encounter (Signed)
Telephone call  

## 2022-12-09 ENCOUNTER — Other Ambulatory Visit (HOSPITAL_COMMUNITY): Payer: Self-pay

## 2022-12-10 ENCOUNTER — Other Ambulatory Visit (HOSPITAL_COMMUNITY): Payer: Self-pay

## 2022-12-13 ENCOUNTER — Ambulatory Visit (INDEPENDENT_AMBULATORY_CARE_PROVIDER_SITE_OTHER): Payer: Medicare Other | Admitting: Nurse Practitioner

## 2022-12-13 ENCOUNTER — Encounter: Payer: Self-pay | Admitting: Nurse Practitioner

## 2022-12-13 VITALS — BP 114/70 | HR 68 | Temp 97.9°F | Ht 62.0 in | Wt 193.4 lb

## 2022-12-13 DIAGNOSIS — I700 Atherosclerosis of aorta: Secondary | ICD-10-CM | POA: Diagnosis not present

## 2022-12-13 DIAGNOSIS — E785 Hyperlipidemia, unspecified: Secondary | ICD-10-CM

## 2022-12-13 DIAGNOSIS — R002 Palpitations: Secondary | ICD-10-CM

## 2022-12-13 DIAGNOSIS — I10 Essential (primary) hypertension: Secondary | ICD-10-CM | POA: Diagnosis not present

## 2022-12-13 DIAGNOSIS — Z0001 Encounter for general adult medical examination with abnormal findings: Secondary | ICD-10-CM

## 2022-12-13 DIAGNOSIS — K5901 Slow transit constipation: Secondary | ICD-10-CM

## 2022-12-13 DIAGNOSIS — E039 Hypothyroidism, unspecified: Secondary | ICD-10-CM

## 2022-12-13 DIAGNOSIS — I5022 Chronic systolic (congestive) heart failure: Secondary | ICD-10-CM

## 2022-12-13 DIAGNOSIS — C8313 Mantle cell lymphoma, intra-abdominal lymph nodes: Secondary | ICD-10-CM

## 2022-12-13 DIAGNOSIS — Z1389 Encounter for screening for other disorder: Secondary | ICD-10-CM

## 2022-12-13 DIAGNOSIS — E66812 Obesity, class 2: Secondary | ICD-10-CM

## 2022-12-13 DIAGNOSIS — Z130 Encounter for screening for diseases of the blood and blood-forming organs and certain disorders involving the immune mechanism: Secondary | ICD-10-CM

## 2022-12-13 DIAGNOSIS — R7303 Prediabetes: Secondary | ICD-10-CM

## 2022-12-13 DIAGNOSIS — R0989 Other specified symptoms and signs involving the circulatory and respiratory systems: Secondary | ICD-10-CM

## 2022-12-13 DIAGNOSIS — M810 Age-related osteoporosis without current pathological fracture: Secondary | ICD-10-CM

## 2022-12-13 DIAGNOSIS — Z79899 Other long term (current) drug therapy: Secondary | ICD-10-CM

## 2022-12-13 DIAGNOSIS — E559 Vitamin D deficiency, unspecified: Secondary | ICD-10-CM

## 2022-12-13 NOTE — Progress Notes (Signed)
Complete Physical  Assessment and Plan:  Encounter for Annual Physical Exam with abnormal findings Due annually  Health Maintenance reviewed Healthy lifestyle reviewed and goals set  Atherosclerosis of aorta (HCC) Discussed lifestyle modifications. Recommended diet heavy in fruits and veggies, omega 3's. Decrease consumption of animal meats, cheeses, and dairy products. Remain active and exercise as tolerated. Continue to monitor. Check lipids/TSH  Systolic CHF (HCC) Euvolemic,  Dr. Clarise Cruz following closely Emphasized salt restriction, less than 2000mg  a day. Encouraged daily monitoring of the patient's weight, call office if 5 lb weight loss or gain in a day.  Encouraged regular exercise. If any increasing shortness of breath, swelling, or chest pressure go to ER immediately.  decrease your fluid intake to less than 2 L daily please remember to always increase your potassium intake with any increase of your fluid pill.   Hypothyroidism Continue Levothyroxine. Reminded to take on an empty stomach 30-15mins before food.  Stop any Biotin Supplement 48-72 hours before next TSH level to reduce the risk of falsely low TSH levels. Continue to monitor.    Palpitations Controlled - no recent flares. Continue to avoid triggers Recently placed on Temazepam for insomnia Continue to monitor  Hyperlipidemia Discussed lifestyle modifications. Recommended diet heavy in fruits and veggies, omega 3's. Decrease consumption of animal meats, cheeses, and dairy products. Remain active and exercise as tolerated. Continue to monitor. Check lipids/TSH   Vitamin D deficiency Continue supplement Monitor levels  Osteoporosis Pursue a combination of weight-bearing exercises and strength training. Patients with severe mobility impairment should be referred for physical therapy. Advised on fall prevention measures including proper lighting in all rooms, removal of area rugs and floor  clutter, use of walking devices as deemed appropriate, avoidance of uneven walking surfaces. Smoking cessation and moderate alcohol consumption if applicable Consume 800 to 1000 IU of vitamin D daily with a goal vitamin D serum value of 30 ng/mL or higher. Aim for 1000 to 1200 mg of elemental calcium daily through supplements and/or dietary sources. Complete 12/2021  Obesity - BMI 36 with htn, hld, prediabetes, hld Discussed appropriate BMI Diet modification. Physical activity. Encouraged/praised to build confidence.  Medication management All medications discussed and reviewed in full. All questions and concerns regarding medications addressed.    Prediabetes Defers check Education: Reviewed 'ABCs' of diabetes management  Discussed goals to be met and/or maintained include A1C (<7) Blood pressure (<130/80) Cholesterol (LDL <70) Continue Eye Exam yearly  Continue Dental Exam Q6 mo Discussed dietary recommendations Discussed Physical Activity recommendations  Hypertension Discussed DASH (Dietary Approaches to Stop Hypertension) DASH diet is lower in sodium than a typical American diet. Cut back on foods that are high in saturated fat, cholesterol, and trans fats. Eat more whole-grain foods, fish, poultry, and nuts Remain active and exercise as tolerated daily.  Monitor BP at home-Call if greater than 130/80.  Check CMP/CBC  Mantle Cell Lymphoma (HCC) Dr. Shirline Frees following In observation at this time Following every 6 months  Constipation Stay well hydrated Remain active Daily stool softener Continue to monitor Further work up if s/s fail to improve.   Screening for hematuria or proteinuria Check and monitor Microalbumin / creatinine urine ratio Check and monitor Urinalysis, Routine w reflex microscopic  Screening for deficiency anemia Monitor B12  Orders Placed This Encounter  Procedures   COMPLETE METABOLIC PANEL WITH GFR   Lipid panel   Hemoglobin A1c    Insulin, random   TSH   Urinalysis, Routine w reflex microscopic   Microalbumin /  creatinine urine ratio   Vitamin B12   Notify office for further evaluation and treatment, questions or concerns if any reported s/s fail to improve.   The patient was advised to call back or seek an in-person evaluation if any symptoms worsen or if the condition fails to improve as anticipated.   Further disposition pending results of labs. Discussed med's effects and SE's.    I discussed the assessment and treatment plan with the patient. The patient was provided an opportunity to ask questions and all were answered. The patient agreed with the plan and demonstrated an understanding of the instructions.  Discussed med's effects and SE's. Screening labs and tests as requested with regular follow-up as recommended.  I provided 30 minutes of face-to-face time during this encounter including counseling, chart review, and critical decision making was preformed.  Future Appointments  Date Time Provider Department Center  12/25/2022 11:30 AM Lucky Cowboy, MD [facility] None  01/10/2023  2:00 PM [facility] None  03/07/2023  2:00 PM [facility] None  05/05/2023  8:30 AM [facility] None  05/12/2023  2:00 PM Si Gaul, MD [facility] None  06/25/2023 10:30 AM Adela Glimpse, NP [facility] None  12/15/2023  9:00 AM Adela Glimpse, NP [facility] None    HPI  67 y.o. female  presents for a complete physical. 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  She is married, 1 child, no grandchildren. 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 Husband is retired.   She follows with Dr. 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  Current therapy includes observation.  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  She had compleeted 6 cycles of systemic chemotherpy with R-CHOP.  She tolerated her tmt fairly well with no concerning adversee effects but then develooped CHF with drop in EF to 20-25%.  She had a repeat CT scan in 06/2020 which showed evidence for disease proression with mild increase in the and started tmt with acalabrutinib.  She was also seen by Dr. Lorenza Chick at [facility]  07/2020 for discussion of potential autologous stem cell transplant.  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  She has been in observation since.  She also had an updated PET scan which showed significant improvement in her disese.  She will continue on observation and have blood work repeated in 3  mo. She was noted to have insomia during this visit and wa provided Restoril 15 mg.  This medication is working well for her.  She is only taking PRN.  The patient's recent lab results show an improvement in her white blood cell count, from 3.0 in August to 3.9 currently. Her hemoglobin, hematocrit, and platelet counts are all within normal ranges. However, her potassium is slightly low, and her kidney function is slightly elevated, which may be due to the  diuretics she is taking for her heart condition.   During her last OV 11/12/22 the patient's daughter inquired about  the possibility of discontinuing the patient's antibiotic and antiviral medications, given the improvement in her white blood cell count. The patient has been on these medications for almost two years following her CAR T therapy. Decision was to continue.    She had lab chole 09/25/2020 by Dr. Sheliah Hatch, doing well, some loose stools but manageable. Now noticed intermittent.  Managed fairly well with stool softeners and diet.  She is followed by GYN Dr. Duane Boston; hx of TAH for endometriosis; She has osteoporosis and is on vivelle dot. Just saw last week. Having a mammogram at breast center due to mild abnormality last year. She has this scheduled for 12/2022.  DEXA UTD  BMI is Body mass index is 35.37 kg/m., she has been working on diet and exercise. Wt Readings from Last 3 Encounters:  12/13/22 193 lb 6.4 oz (87.7 kg)  11/12/22 189 lb 4.8 oz (85.9 kg)  10/30/22 190 lb 3.2 oz (86.3 kg)   Systolic CHF following chemotherapy, Dr. Clarise Cruz follows, on low dose coreg, losartan, spironolactone and PRN torsemide. Chronic exertional dyspnea unchanged. Recent ECHO 05/04/2020 with EF 50-55%.   Her blood pressure has been controlled at home, today their BP is BP: 114/70.   She does not workout. She denies chest pain, dizziness.   She has aortic atherosclerosis per CT 12/2018.   She is not on cholesterol medication (has been off due to ongoing mantle cell lymphoma treatments) and denies myalgias. Her cholesterol is not at goal. The cholesterol last visit was:  Lab Results  Component Value Date   CHOL 250 (H) 09/05/2021   HDL 59 09/05/2021   LDLCALC 155 (H) 09/05/2021   TRIG 197 (H) 09/05/2021   CHOLHDL 4.2 09/05/2021  . She has been working on diet and exercise for hx of prediabetes,  and denies foot ulcerations, hyperglycemia, hypoglycemia , increased appetite, nausea, paresthesia of the feet, polydipsia, polyuria, visual disturbances, vomiting and weight loss. Last A1C in the office was:  Lab  Results  Component Value Date   HGBA1C 5.5 09/05/2021   She has CKD III, monitored closely by cardiology and onc.  Lab Results  Component Value Date   GFRNONAA 73 (L) 11/12/2022   Patient is on Vitamin D supplement, 5000 IU daily, hasn't changed since last, agreeable to increasing dose Lab Results  Component Value Date   VD25OH 22 (L) 12/12/2021     She is on thyroid medication. Her medication was not changed last visit.   Lab Results  Component Value Date   TSH 1.813 04/25/2022    Lab Results  Component Value Date   VITAMINB12 445 12/12/2020      Current Medications:  Current Outpatient Medications on File Prior to Visit  Medication Sig Dispense Refill   amiodarone (PACERONE) 200 MG tablet Take 0.5 tablets (100 mg total) by mouth daily. 45 tablet 3   apixaban (ELIQUIS) 5 MG TABS tablet Take 1 tablet (5 mg total) by mouth 2 (two) times daily. 60 tablet 6   Cholecalciferol (VITAMIN D-3) 25 MCG (1000 UT) CAPS Take 1 capsule by mouth daily.     estradiol (VIVELLE-DOT) 0.05 MG/24HR patch Place 1 patch onto the skin 2 (two) times a week. Tuesdays & Fridays     FARXIGA 10 MG TABS tablet TAKE 1 TABLET BY MOUTH EVERY DAY 30 tablet 11   ivabradine (CORLANOR) 7.5 MG TABS tablet TAKE 1  TABLET BY MOUTH TWICE A DAY WITH FOOD 60 tablet 3   levothyroxine (SYNTHROID) 100 MCG tablet TAKE 1 TABLET BY MOUTH DAILY ON AN EMPTY STOMACH WITH ONLY WATER FOR 30 MINUTES & NO ANTACID/CALCIUM/MAGNESIUM FOR 4 HOURS & AVOID BIOTIN 90 tablet 1   losartan (COZAAR) 25 MG tablet Take 0.5 tablets (12.5 mg total) by mouth daily. 45 tablet 3   ondansetron (ZOFRAN-ODT) 4 MG disintegrating tablet Take 1 tablet (4 mg total) by mouth every 8 (eight) hours as needed for nausea or vomiting. 30 tablet 3   potassium chloride SA (KLOR-CON M) 20 MEQ tablet Take 2 tablets (40 mEq total) by mouth daily. 30 tablet 6   prochlorperazine (COMPAZINE) 10 MG tablet TAKE 1 TABLET BY MOUTH EVERY 6 HOURS AS NEEDED FOR NAUSEA OR  VOMITING. 30 tablet 0   spironolactone (ALDACTONE) 25 MG tablet TAKE 1 TABLET BY MOUTH EVERY DAY 90 tablet 3   sulfamethoxazole-trimethoprim (BACTRIM DS) 800-160 MG tablet Take 1 tablet by mouth See admin instructions. Take 1 tablet twice daily on Saturdays and Sundays ONLY     torsemide (DEMADEX) 20 MG tablet Take 2 tablets (40 mg total) by mouth daily. 60 tablet 11   valACYclovir (VALTREX) 500 MG tablet Take 500 mg by mouth 2 (two) times daily.     fluticasone (FLONASE) 50 MCG/ACT nasal spray Place 1 spray into both nostrils as needed for allergies or rhinitis.     ipratropium (ATROVENT) 0.03 % nasal spray PLACE 2 SPRAYS INTO THE NOSE 4 (FOUR) TIMES DAILY. 90 mL 1   rosuvastatin (CRESTOR) 10 MG tablet Take 1 tablet (10 mg total) by mouth daily. (Patient not taking: Reported on 01/31/2022) 90 tablet 3   No current facility-administered medications on file prior to visit.    Health Maintenance:   Immunization History  Administered Date(s) Administered   Influenza Inj Mdck Quad Pf 10/04/2017   Influenza Split 11/06/2010   Influenza, High Dose Seasonal PF 12/12/2020, 12/12/2021   Influenza, Seasonal, Injecte, Preservative Fre 01/29/2016   Influenza,inj,Quad PF,6+ Mos 01/27/2019   PNEUMOCOCCAL CONJUGATE-20 12/12/2020   Tdap 07/29/2016   Tetanus: 2018  Influenza Due Prevnar 20 - TODAY  Pneumonia due Shingles declines Covid 19: has had, req dates  Pap: getting pelvics only, goes to GYN, pelvic, but has had hysterectomy MGM: Due 12/24, at GYN has upcoming scheduled with breast center  DEXA: 2014, had 2018-2019, GYN monitoring Has scheduled 12/2022 Colonoscopy: 03/2018 due 2030 [place] AB 2013  Last Dental Exam: Dr. Elroy Channel, 2024 Last Eye Exam: Dr. Emily Filbert, 2023 - Patient will reach out.   Last derm: Dr. Terri Piedra, last 2020 - defers at this time.  No new skin issues or complaints.   Patient Care Team: Lucky Cowboy, MD as PCP - General (Internal Medicine) Parke Poisson, MD as PCP -  Cardiology (Cardiology) Iva Boop, MD as Consulting Physician (Gastroenterology) Huel Cote, MD as Consulting Physician (Obstetrics and Gynecology) Cherlyn Roberts, MD as Consulting Physician (Dermatology) Si Gaul, MD as Consulting Physician (Oncology)   Medical History:  Past Medical History:  Diagnosis Date   Allergy    Cancer [facility])    being worked up for lymphoma   CHF (congestive heart failure) (HCC)    Colon polyp 12/29/2006   Dysrhythmia    Frozen shoulder 11/16/2015   Gallstones 08/17/2020   Hyperlipidemia    Hypothyroidism    Obesity    BMI 34   Osteoporosis    Palpitations    Pre-diabetes    Vitamin  D deficiency    Allergies Allergies  Allergen Reactions   Adriamycin [Doxorubicin] Other (See Comments)    Acute heart failure (chemo from Jan - May 2021)   Ppd [Tuberculin Purified Protein Derivative] Swelling    Positive reaction.  Negative Chest Xray 06/16/12    SURGICAL HISTORY She  has a past surgical history that includes Abdominal hysterectomy (1997); Cesarean section (1983); Colonoscopy w/ biopsies (12/29/2006); laparoscopy; Upper gastrointestinal endoscopy; IR IMAGING GUIDED PORT INSERTION (01/15/2019); RIGHT/LEFT HEART CATH AND CORONARY ANGIOGRAPHY (N/A, 06/25/2019); Cholecystectomy (N/A, 09/25/2020); and TEE without cardioversion (N/A, 07/04/2021). FAMILY HISTORY Her family history includes Alcohol abuse in her father; Bone cancer in her maternal aunt; Breast cancer in her maternal aunt; Breast cancer (age of onset: 73) in her niece; Breast cancer (age of onset: 75) in her sister; Cancer (age of onset: 69) in her mother; Diabetes in her brother, mother, paternal grandmother, and sister; Hashimoto's thyroiditis in her sister; Heart disease in her father and maternal aunt; Heart disease (age of onset: 53) in her mother; Hyperlipidemia in her brother and maternal aunt; Hypertension in her brother, father, and mother; Hypothyroidism in her brother  and sister; Liver disease in her maternal grandmother and mother; Prostate cancer (age of onset: 1) in her brother; Thyroid disease in her mother. SOCIAL HISTORY She  reports that she quit smoking about 26 years ago. Her smoking use included cigarettes. She started smoking about 41 years ago. She has never used smokeless tobacco. She reports that she does not drink alcohol and does not use drugs.  Review of Systems: Review of Systems  Constitutional:  Negative for chills, fever, malaise/fatigue and weight loss.  HENT:  Negative for congestion, ear pain, hearing loss, sore throat and tinnitus.   Eyes: Negative.  Negative for blurred vision and double vision.  Respiratory:  Negative for cough, sputum production, shortness of breath and wheezing.   Cardiovascular:  Negative for chest pain, palpitations, orthopnea, claudication, leg swelling and PND.  Gastrointestinal:  Negative for abdominal pain, blood in stool, constipation, diarrhea, heartburn, melena, nausea and vomiting.  Genitourinary: Negative.   Musculoskeletal:  Negative for falls, joint pain, myalgias and neck pain.  Skin:  Negative for itching and rash.  Neurological:  Negative for dizziness, tingling, sensory change, loss of consciousness, weakness and headaches.  Endo/Heme/Allergies:  Negative for polydipsia.  Psychiatric/Behavioral: Negative.  Negative for depression, memory loss, substance abuse and suicidal ideas. The patient is not nervous/anxious and does not have insomnia.   All other systems reviewed and are negative.   Physical Exam: Estimated body mass index is 35.37 kg/m as calculated from the following:   Height as of this encounter: 5\' 2"  (1.575 m).   Weight as of this encounter: 193 lb 6.4 oz (87.7 kg). BP 114/70   Pulse 68   Temp 97.9 F (36.6 C)   Ht 5\' 2"  (1.575 m)   Wt 193 lb 6.4 oz (87.7 kg)   SpO2 99%   BMI 35.37 kg/m   General Appearance: Well nourished well developed, in no apparent distress.   Eyes: PERRLA, EOMs, conjunctiva no swelling or erythema ENT/Mouth: Ear canals normal without obstruction, swelling, erythema, or discharge.  TMs normal bilaterally with no erythema, bulging, retraction, or loss of landmark.  Oropharynx moist and clear with no exudate, erythema, or swelling.   Neck: Supple, thyroid normal. No bruits.  No cervical adenopathy Respiratory: Respiratory effort normal, Breath sounds clear A&P without wheeze, rhonchi, rales.   Cardio: RRR without murmurs, rubs or gallops, occ  extra beat. Brisk peripheral pulses without edema.  Chest: symmetric, with normal excursions Breasts: defer to GYN, getting mammograms Abdomen: Morbidly obese, Soft, nontender, no guarding, rebound, hernias, masses, or organomegaly.  Lymphatics: Non tender without lymphadenopathy.  Musculoskeletal: Full ROM all peripheral extremities,5/5 strength, and normal gait.  Skin: Warm, dry without rashes, lesions, ecchymosis. Neuro: Awake and oriented X 3, Cranial nerves intact, reflexes equal bilaterally. Normal muscle tone, no cerebellar symptoms. Sensation intact.  Psych:  normal affect, Insight and Judgment appropriate.  GU: defer to GYN  EKG: defer - cardiology following  Gevon Markus 11:44 AM [facility]

## 2022-12-13 NOTE — Patient Instructions (Signed)

## 2022-12-15 ENCOUNTER — Other Ambulatory Visit (HOSPITAL_COMMUNITY): Payer: Self-pay | Admitting: Internal Medicine

## 2022-12-16 LAB — URINALYSIS, ROUTINE W REFLEX MICROSCOPIC
Bilirubin Urine: NEGATIVE
Hgb urine dipstick: NEGATIVE
Ketones, ur: NEGATIVE
Leukocytes,Ua: NEGATIVE
Nitrite: NEGATIVE
RBC / HPF: NONE SEEN /HPF (ref 0–2)
Specific Gravity, Urine: 1.017 (ref 1.001–1.035)
pH: 5.5 (ref 5.0–8.0)

## 2022-12-16 LAB — HEMOGLOBIN A1C
Hgb A1c MFr Bld: 5.8 %{Hb} — ABNORMAL HIGH (ref <5.7)
Mean Plasma Glucose: 120 mg/dL
eAG (mmol/L): 6.6 mmol/L

## 2022-12-16 LAB — TSH: TSH: 2.99 m[IU]/L (ref 0.40–4.50)

## 2022-12-16 LAB — COMPLETE METABOLIC PANEL WITH GFR
AG Ratio: 2.2 (calc) (ref 1.0–2.5)
ALT: 17 U/L (ref 6–29)
AST: 20 U/L (ref 10–35)
Albumin: 4.3 g/dL (ref 3.6–5.1)
Alkaline phosphatase (APISO): 91 U/L (ref 37–153)
BUN/Creatinine Ratio: 21 (calc) (ref 6–22)
BUN: 22 mg/dL (ref 7–25)
CO2: 34 mmol/L — ABNORMAL HIGH (ref 20–32)
Calcium: 9.6 mg/dL (ref 8.6–10.4)
Chloride: 95 mmol/L — ABNORMAL LOW (ref 98–110)
Creat: 1.06 mg/dL — ABNORMAL HIGH (ref 0.50–1.05)
Globulin: 2 g/dL (ref 1.9–3.7)
Glucose, Bld: 87 mg/dL (ref 65–99)
Potassium: 3.7 mmol/L (ref 3.5–5.3)
Sodium: 139 mmol/L (ref 135–146)
Total Bilirubin: 0.5 mg/dL (ref 0.2–1.2)
Total Protein: 6.3 g/dL (ref 6.1–8.1)
eGFR: 58 mL/min/{1.73_m2} — ABNORMAL LOW (ref >=60)

## 2022-12-16 LAB — MICROALBUMIN / CREATININE URINE RATIO
Creatinine, Urine: 110 mg/dL (ref 20–275)
Microalb Creat Ratio: 35 mg/g{creat} — ABNORMAL HIGH (ref <30)
Microalb, Ur: 3.8 mg/dL

## 2022-12-16 LAB — LIPID PANEL
Cholesterol: 254 mg/dL — ABNORMAL HIGH (ref <200)
HDL: 67 mg/dL (ref >=50)
LDL Cholesterol (Calc): 165 mg/dL — ABNORMAL HIGH
Non-HDL Cholesterol (Calc): 187 mg/dL — ABNORMAL HIGH (ref <130)
Total CHOL/HDL Ratio: 3.8 (calc) (ref <5.0)
Triglycerides: 107 mg/dL (ref <150)

## 2022-12-16 LAB — INSULIN, RANDOM: Insulin: 8.8 u[IU]/mL

## 2022-12-16 LAB — MICROSCOPIC MESSAGE

## 2022-12-16 LAB — VITAMIN B12: Vitamin B-12: 406 pg/mL (ref 200–1100)

## 2022-12-25 ENCOUNTER — Other Ambulatory Visit (HOSPITAL_COMMUNITY): Payer: Self-pay

## 2022-12-25 ENCOUNTER — Encounter: Payer: Medicare Other | Admitting: Internal Medicine

## 2023-01-09 ENCOUNTER — Ambulatory Visit (INDEPENDENT_AMBULATORY_CARE_PROVIDER_SITE_OTHER): Payer: Medicare Other | Admitting: Internal Medicine

## 2023-01-09 ENCOUNTER — Encounter: Payer: Self-pay | Admitting: Internal Medicine

## 2023-01-09 VITALS — BP 102/62 | HR 72 | Temp 97.5°F | Resp 16 | Ht 62.0 in | Wt 193.6 lb

## 2023-01-09 DIAGNOSIS — B079 Viral wart, unspecified: Secondary | ICD-10-CM | POA: Diagnosis not present

## 2023-01-09 NOTE — Progress Notes (Signed)
 Natalie Baker      ADULT   &   ADOLESCENT      INTERNAL MEDICINE  Natalie Baker, M.D.          Lonell Rous, ANP        Bascom Necessary, FNP  [facility] [address]  [place], [place]. [address] Telephone [phone] Telefax [phone]   Future Appointments  Date Time Provider Department  01/09/2023  4:00 PM Baker Elsie, MD [facility]  05/12/2023  2:00 PM Sherrod Sherrod, MD [facility]  06/25/2023              wellness 10:30 AM Necessary Bascom, NP [facility]  12/15/2023               cpe  9:00 AM Necessary Bascom, NP [facility]    History of Present Illness:                                            The patient is a very nice 68 y.o. MWF with Labile hypertension, Hyperlipidemia,  Prediabetes, Vitamin D  deficiency, Hypothyroidism; Morbid obesity(BMI 35+ with prediabetes, hyperlipidemia, htn) Osteoporosis & hx/o Aortic atherosclerosis (HCC) - per CT 12/22/2018;   Patient is followed by Dr Gudena for [facility] cell lymphoma .     She presents today with concerns of  a growth ? wart of the anti helix fold of the left ear.     Current Outpatient Medications on File Prior to Visit  Medication Sig   amiodarone   200 MG tablet Take 0.5 tablets (100 mg total) by mouth daily.   apixaban   5 MG TABS tablet Take 1 tablet 2   times daily.    VITAMIN D  1000 u CAPS Take 1 capsule  daily.   estradiol  (Vivelle -DOT) 0.05 mg/24 hr  1 patch 2 times a week. Tuesdays & Fridays   FARXIGA  10 MG TABS tablet TAKE 1 TABLET  EVERY DAY   ivabradine  (CORLANOR ) 7.5 MG  TAKE 1 TABLET TWICE A DAY WITH FOOD   levothyroxine   100 MCG tablet TAKE 1 TABLET  DAILY    losartan  25 MG tablet Take 0.5 tablets (12.5 mg total) by mouth daily.   ondansetron  -ODT 4 MG  Take 1 tablet  every 8 hours as needed for nausea    potassium chloride   20 MEQ tablet Take 2 tablets (40 mEq )  daily.   prochlorperazine  10 MG tablet TAKE 1 TAB EVERY 6 HRS AS NEEDED FOR NAUSEA    T (Patient not taking:  Reported 01/31/2022)   spironolactone  25 MG tablet TAKE 1 TABLET  EVERY DAY   BACTRIM  DS 800-160 MG tablet Take 1 tablet twice daily on Saturdays and Sundays ONLY   torsemide   20 MG tablet Take 2 tablets (40 mg total) by mouth daily.   valACYclovir   500 MG tablet Take 500 mg by mouth 2 (two) times daily.    Allergies  Allergen Reactions   Adriamycin  [Doxorubicin ] Other (See Comments)    Acute heart failure (chemo from Jan - May 2021)   Ppd [Tuberculin Purified Protein Derivative] Swelling    Positive reaction.  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   Observations/Objective:  BP 102/62   Pulse 72   Temp (!) 97.5 F (36.4 C)   Resp 16   Ht 5' 2 (1.575 m)   Wt 193 lb 9.6 oz (87.8 kg)   SpO2 96%   BMI 35.41 kg/m   Focused exam of the left ear finds a 5 mm filamentous wart og the fold of the antihelix of the [name].   Procedure  ( CPT   17000 )     After informed consent & aseptic prep with alcohol the area was locally anesthetizes with 0.3 ml of Bupivacaine  0.5%  intralesional. Then with an ophthalmic cautery the lesion was fulgurated deeply for electrodestruction & hemostasis. Then a sterile bandaid was applied & patient was instructed in post -op wound care til healed .    Assessment and Plan:   1. Verruca (Primary)    Follow Up Instructions:        I discussed the assessment and treatment plan with the patient.  The patient was provided an opportunity to ask questions and all were answered. The patient agreed with the plan and demonstrated an understanding of the instructions.       The patient was advised to call back or seek an in-person evaluation if the symptoms worsen or if the condition fails to improve as anticipated.    Natalie JONETTA Richards, MD

## 2023-01-10 ENCOUNTER — Inpatient Hospital Stay: Payer: Medicare Other | Attending: Internal Medicine

## 2023-01-10 DIAGNOSIS — Z8572 Personal history of non-Hodgkin lymphomas: Secondary | ICD-10-CM | POA: Insufficient documentation

## 2023-01-10 DIAGNOSIS — Z95828 Presence of other vascular implants and grafts: Secondary | ICD-10-CM

## 2023-01-10 DIAGNOSIS — Z452 Encounter for adjustment and management of vascular access device: Secondary | ICD-10-CM | POA: Insufficient documentation

## 2023-01-10 MED ORDER — HEPARIN SOD (PORK) LOCK FLUSH 100 UNIT/ML IV SOLN
500.0000 [IU] | Freq: Once | INTRAVENOUS | Status: AC | PRN
  Administered 2023-01-10: 500 [IU]

## 2023-01-10 MED ORDER — SODIUM CHLORIDE 0.9% FLUSH
10.0000 mL | INTRAVENOUS | Status: DC | PRN
  Administered 2023-01-10: 10 mL

## 2023-01-13 ENCOUNTER — Other Ambulatory Visit (HOSPITAL_BASED_OUTPATIENT_CLINIC_OR_DEPARTMENT_OTHER): Payer: Self-pay

## 2023-01-13 ENCOUNTER — Other Ambulatory Visit (HOSPITAL_COMMUNITY): Payer: Self-pay

## 2023-01-13 ENCOUNTER — Other Ambulatory Visit: Payer: Self-pay

## 2023-01-14 ENCOUNTER — Other Ambulatory Visit (HOSPITAL_COMMUNITY): Payer: Self-pay

## 2023-01-14 ENCOUNTER — Other Ambulatory Visit: Payer: Self-pay

## 2023-01-14 ENCOUNTER — Encounter: Payer: Self-pay | Admitting: Pharmacist

## 2023-01-16 ENCOUNTER — Other Ambulatory Visit: Payer: Self-pay

## 2023-01-16 ENCOUNTER — Other Ambulatory Visit (HOSPITAL_COMMUNITY): Payer: Self-pay

## 2023-01-17 ENCOUNTER — Other Ambulatory Visit: Payer: Self-pay

## 2023-01-17 ENCOUNTER — Other Ambulatory Visit (HOSPITAL_COMMUNITY): Payer: Self-pay

## 2023-01-28 ENCOUNTER — Other Ambulatory Visit (HOSPITAL_COMMUNITY): Payer: Self-pay

## 2023-02-06 ENCOUNTER — Other Ambulatory Visit: Payer: Self-pay

## 2023-02-07 ENCOUNTER — Other Ambulatory Visit (HOSPITAL_COMMUNITY): Payer: Self-pay

## 2023-02-19 ENCOUNTER — Other Ambulatory Visit: Payer: Self-pay

## 2023-02-19 DIAGNOSIS — E039 Hypothyroidism, unspecified: Secondary | ICD-10-CM

## 2023-02-19 MED ORDER — LEVOTHYROXINE SODIUM 100 MCG PO TABS: ORAL_TABLET | ORAL | 0 refills | Status: DC

## 2023-02-20 ENCOUNTER — Ambulatory Visit: Payer: Self-pay

## 2023-02-20 NOTE — Telephone Encounter (Signed)
  Chief Complaint: Fever, Strep, pink eye flu Symptoms:  Frequency: 2 days Pertinent Negatives: Patient denies  Disposition: [] ED /[x] Urgent Care (no appt availability in office) / [] Appointment(In office/virtual)/ []  [facility]/ [] Home Care/ [] Refused Recommended Disposition /[] [facility]/ []  Follow-up with PCP Additional Notes: Pt is immunocompromised and was recently seen at [facility]. Where they diagnosed, FLU Strep and pink eye.  PT will return to UC for care this evening or tomorrow morning. PT will monitor s/s and go to ED if S/S worsen.   Call dropped - called pt back. Call went to Voice mail. Left message to call back. Pt called back - but call did not come to NT. Called pt back.   Reason for Disposition  Transplant patient (e.g., kidney, liver, lung, heart)  Answer Assessment - Initial Assessment Questions 1. TEMPERATURE: "What is the most recent temperature?"  "How was it measured?"      99.9 - goes up and down 2. ONSET: "When did the fever start?"      Yesterday or the day before 3. CHILLS: "Do you have chills?" If yes: "How bad are they?"  (e.g., none, mild, moderate, severe)   - NONE: no chills   - MILD: feeling cold   - MODERATE: feeling very cold, some shivering (feels better under a thick blanket)   - SEVERE: feeling extremely cold with shaking chills (general body shaking, rigors; even under a thick blanket)      Moderate 4. OTHER SYMPTOMS: "Do you have any other symptoms besides the fever?"  (e.g., abdomen pain, cough, diarrhea, earache, headache, sore throat, urination pain)     Nausea, HA, feels wobbly 5. CAUSE: If there are no symptoms, ask: "What do you think is causing the fever?"      no 6. CONTACTS: "Does anyone else in the family have an infection?"     Flu - strep and pink eye 7. TREATMENT: "What have you done so far to treat this fever?" (e.g., medications)     Erythromycin, tessalon pearls, inhaler 8. IMMUNOCOMPROMISE: "Do you have  of the following: diabetes, HIV positive, splenectomy, cancer chemotherapy, chronic steroid treatment, transplant patient, etc."     yes  Protocols used: Fever-A-AH

## 2023-02-25 ENCOUNTER — Other Ambulatory Visit: Payer: Self-pay

## 2023-02-25 ENCOUNTER — Other Ambulatory Visit (HOSPITAL_COMMUNITY): Payer: Self-pay

## 2023-03-01 ENCOUNTER — Encounter: Payer: Self-pay | Admitting: Internal Medicine

## 2023-03-07 ENCOUNTER — Inpatient Hospital Stay: Payer: Medicare Other | Attending: Internal Medicine

## 2023-03-07 DIAGNOSIS — Z8572 Personal history of non-Hodgkin lymphomas: Secondary | ICD-10-CM | POA: Insufficient documentation

## 2023-03-07 DIAGNOSIS — Z95828 Presence of other vascular implants and grafts: Secondary | ICD-10-CM

## 2023-03-07 DIAGNOSIS — Z452 Encounter for adjustment and management of vascular access device: Secondary | ICD-10-CM | POA: Diagnosis present

## 2023-03-07 MED ORDER — HEPARIN SOD (PORK) LOCK FLUSH 100 UNIT/ML IV SOLN
500.0000 [IU] | Freq: Once | INTRAVENOUS | Status: AC | PRN
  Administered 2023-03-07: 500 [IU]

## 2023-03-07 MED ORDER — SODIUM CHLORIDE 0.9% FLUSH
10.0000 mL | INTRAVENOUS | Status: DC | PRN
Start: 2023-03-07 — End: 2023-03-07
  Administered 2023-03-07: 10 mL

## 2023-03-15 ENCOUNTER — Other Ambulatory Visit (HOSPITAL_COMMUNITY): Payer: Self-pay | Admitting: Internal Medicine

## 2023-03-24 ENCOUNTER — Telehealth: Payer: Self-pay

## 2023-03-24 NOTE — Telephone Encounter (Signed)
 Spoke with patients daughter this morning requesting for patient to be seen sooner.  She states that patient had the flu and strep about a month ago and took antibiotics twice. Daughter reports patient's flu and strep symptoms have subsided, but the patient is very fatigued, appetite has decreased, no fever but feels hot and clammy, and has body pain. Daughter said patient reports "body pain is everywhere- legs, arms, and back." Informed to take tylenol as needed. Patients appt moved to 03/27/2023 for port flush with labs and provider visit with Dr. Arbutus Ped. Daughter verbalized understanding.   LVM for daughter to make sure patient still proceeds with scan appt in April along with port flush with labs before hand and Dr. Arbutus Ped visit about a week later.

## 2023-03-27 ENCOUNTER — Encounter: Payer: Self-pay | Admitting: Medical Oncology

## 2023-03-27 ENCOUNTER — Other Ambulatory Visit: Payer: Self-pay | Admitting: Medical Oncology

## 2023-03-27 ENCOUNTER — Inpatient Hospital Stay

## 2023-03-27 ENCOUNTER — Inpatient Hospital Stay: Attending: Internal Medicine

## 2023-03-27 ENCOUNTER — Inpatient Hospital Stay: Attending: Internal Medicine | Admitting: Internal Medicine

## 2023-03-27 VITALS — BP 89/56 | HR 75 | Temp 97.8°F | Resp 15 | Wt 178.7 lb

## 2023-03-27 DIAGNOSIS — C8313 Mantle cell lymphoma, intra-abdominal lymph nodes: Secondary | ICD-10-CM

## 2023-03-27 DIAGNOSIS — R1012 Left upper quadrant pain: Secondary | ICD-10-CM | POA: Insufficient documentation

## 2023-03-27 DIAGNOSIS — Z95828 Presence of other vascular implants and grafts: Secondary | ICD-10-CM

## 2023-03-27 DIAGNOSIS — R112 Nausea with vomiting, unspecified: Secondary | ICD-10-CM

## 2023-03-27 DIAGNOSIS — R110 Nausea: Secondary | ICD-10-CM | POA: Diagnosis present

## 2023-03-27 DIAGNOSIS — Z8572 Personal history of non-Hodgkin lymphomas: Secondary | ICD-10-CM | POA: Insufficient documentation

## 2023-03-27 LAB — CBC WITH DIFFERENTIAL (CANCER CENTER ONLY)
Abs Immature Granulocytes: 0.17 10*3/uL — ABNORMAL HIGH (ref 0.00–0.07)
Basophils Absolute: 0 10*3/uL (ref 0.0–0.1)
Basophils Relative: 1 %
Eosinophils Absolute: 0 10*3/uL (ref 0.0–0.5)
Eosinophils Relative: 0 %
HCT: 36.6 % (ref 36.0–46.0)
Hemoglobin: 12 g/dL (ref 12.0–15.0)
Immature Granulocytes: 6 %
Lymphocytes Relative: 12 %
Lymphs Abs: 0.4 10*3/uL — ABNORMAL LOW (ref 0.7–4.0)
MCH: 28.2 pg (ref 26.0–34.0)
MCHC: 32.8 g/dL (ref 30.0–36.0)
MCV: 86.1 fL (ref 80.0–100.0)
Monocytes Absolute: 0.5 10*3/uL (ref 0.1–1.0)
Monocytes Relative: 16 %
Neutro Abs: 2 10*3/uL (ref 1.7–7.7)
Neutrophils Relative %: 65 %
Platelet Count: 422 10*3/uL — ABNORMAL HIGH (ref 150–400)
RBC: 4.25 MIL/uL (ref 3.87–5.11)
RDW: 15.9 % — ABNORMAL HIGH (ref 11.5–15.5)
Smear Review: NORMAL
WBC Count: 3.1 10*3/uL — ABNORMAL LOW (ref 4.0–10.5)
nRBC: 0 % (ref 0.0–0.2)

## 2023-03-27 LAB — CMP (CANCER CENTER ONLY)
ALT: 13 U/L (ref 0–44)
AST: 21 U/L (ref 15–41)
Albumin: 3.6 g/dL (ref 3.5–5.0)
Alkaline Phosphatase: 120 U/L (ref 38–126)
Anion gap: 9 (ref 5–15)
BUN: 10 mg/dL (ref 8–23)
CO2: 34 mmol/L — ABNORMAL HIGH (ref 22–32)
Calcium: 8.9 mg/dL (ref 8.9–10.3)
Chloride: 93 mmol/L — ABNORMAL LOW (ref 98–111)
Creatinine: 1.02 mg/dL — ABNORMAL HIGH (ref 0.44–1.00)
GFR, Estimated: >60 mL/min (ref >60)
Glucose, Bld: 84 mg/dL (ref 70–99)
Potassium: 3.1 mmol/L — ABNORMAL LOW (ref 3.5–5.1)
Sodium: 136 mmol/L (ref 135–145)
Total Bilirubin: 0.4 mg/dL (ref 0.0–1.2)
Total Protein: 6.5 g/dL (ref 6.5–8.1)

## 2023-03-27 LAB — LACTATE DEHYDROGENASE: LDH: 203 U/L — ABNORMAL HIGH (ref 98–192)

## 2023-03-27 MED ORDER — SODIUM CHLORIDE 0.9 % IV SOLN: INTRAVENOUS | Status: DC

## 2023-03-27 MED ORDER — ONDANSETRON 4 MG PO TBDP: 4.0000 mg | ORAL_TABLET | Freq: Three times a day (TID) | ORAL | 3 refills | Status: DC | PRN

## 2023-03-27 MED ORDER — SODIUM CHLORIDE 0.9% FLUSH
10.0000 mL | INTRAVENOUS | Status: DC | PRN
Start: 2023-03-27 — End: 2023-03-27
  Administered 2023-03-27: 10 mL

## 2023-03-27 MED ORDER — POTASSIUM CHLORIDE CRYS ER 20 MEQ PO TBCR: 20.0000 meq | EXTENDED_RELEASE_TABLET | Freq: Every day | ORAL | 0 refills | Status: DC

## 2023-03-27 MED ORDER — HEPARIN SOD (PORK) LOCK FLUSH 100 UNIT/ML IV SOLN
500.0000 [IU] | Freq: Once | INTRAVENOUS | Status: AC | PRN
  Administered 2023-03-27: 500 [IU]

## 2023-03-27 MED ORDER — ONDANSETRON HCL 4 MG/2ML IJ SOLN
8.0000 mg | Freq: Once | INTRAMUSCULAR | Status: AC
  Administered 2023-03-27: 8 mg via INTRAVENOUS
  Filled 2023-03-27: qty 4

## 2023-03-27 NOTE — Progress Notes (Signed)
 [facility] [phone]   [phone]  OFFICE PROGRESS NOTE  Lucky Cowboy, MD [address] [place] [address]  DIAGNOSIS: Stage IV non-Hodgkin's lymphoma, mantle cell lymphoma.  She presented with left iliac and retroperitoneal lymphadenopathy.  She also presented with left axillary lymphadenopathy and bone marrow involvement.  She was diagnosed in December 2020.   PRIOR THERAPY:  1) Systemic chemotherapy with R-CHOP with Neulasta support.  First dose on 01/27/2019. Status post 6 cycles.   2) Acalabrutinib 100 mg p.o. twice daily.  First dose started June 17, 2020.  Status post 3 months of treatment. 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 female returns to the clinic today for follow-up visit and her daughter Archie Patten was available by phone during the visit.Discussed the use of AI scribe software for clinical note transcription with the patient, who gave verbal consent to proceed.  History of Present Illness   Natalie Baker is a 68 year old female with stage four mantle cell lymphoma who presents for evaluation and repeat blood work. She is accompanied by her daughter, Natalie Baker, who is on the phone with her.  She was diagnosed with stage four mantle cell lymphoma in December 2020 and underwent six cycles of RCHOP chemotherapy. This was followed by acalabrutinib 100 mg twice dailyfor 3 months and CAR T-cell therapy with Tecartus at [facility] on January 23, 2021. Since then, she has been under observation.  She experiences significant weakness and a lack of appetite, resulting in a weight loss of  over 15 pounds since her last visit in January, when she weighed 194 pounds. Her current weight is 178.7 pounds, and she notes it is even lower at home. She experiences stomach pain and nausea almost every time she eats or drinks, which she associates with her gallbladder removal prior to CAR T-cell therapy.  She describes persistent body pains, particularly in the lower abdomen and across her body, which have worsened over time. No night sweats, fever, or chills, although she occasionally experiences chills and sweating similar to hot flashes, which have increased in frequency recently.  Her daughter mentions that both mother and her husband had the flu about one and a half to two months ago, during which she tested positive for the flu and strep throat, requiring two rounds of antibiotics. Post-flu, she experienced increased fatigue and constant stomach pain. Her current medications include an antibiotic and an antiviral or antifungal, as well as Zofran for nausea, which she has been using as needed.  Her recent blood work shows a white blood count of 3.1, down from 3.9 in November, and a hemoglobin level of 12.0, which is lower than previous levels but still within normal range. Her platelet count is 422,000. She reports occasional shortness of breath but is uncertain of its significance. She also notes pain in the left upper quadrant and lower abdomen, which she associates with her ongoing symptoms.        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  MEDICATIONS:  Current Outpatient Medications  Medication Sig Dispense Refill   amiodarone (PACERONE) 200 MG tablet Take 0.5 tablets (100 mg total) by mouth daily. 45 tablet 3   Cholecalciferol (VITAMIN D-3) 25 MCG (1000 UT) CAPS Take 1 capsule by mouth daily.     ELIQUIS 5 MG TABS tablet TAKE 1 TABLET BY MOUTH TWICE A DAY 60 tablet 6   estradiol (VIVELLE-DOT) 0.05 MG/24HR patch Place 1 patch onto the skin 2 (two) times a week. Tuesdays & Fridays     FARXIGA 10 MG TABS tablet TAKE 1 TABLET BY MOUTH EVERY DAY 30 tablet 11   ivabradine (CORLANOR) 7.5 MG TABS tablet TAKE 1 TABLET BY MOUTH TWICE A DAY WITH FOOD 60 tablet 3   levothyroxine (SYNTHROID) 100 MCG tablet TAKE 1 TABLET BY MOUTH DAILY ON AN EMPTY STOMACH WITH ONLY WATER FOR 30 MINUTES & NO ANTACID/CALCIUM/MAGNESIUM FOR 4 HOURS & AVOID BIOTIN 90 tablet 0   losartan (COZAAR) 25 MG tablet Take 0.5 tablets (12.5 mg total) by mouth daily. 45 tablet 3   ondansetron (ZOFRAN-ODT) 4 MG disintegrating tablet Take 1 tablet (4 mg total) by mouth every 8 (eight) hours as needed for nausea or vomiting. (Patient not taking: Reported on 01/09/2023) 30 tablet 3   potassium chloride SA (KLOR-CON M) 20 MEQ tablet Take 2 tablets (40 mEq total) by mouth daily. 30 tablet 6   prochlorperazine (COMPAZINE) 10 MG tablet TAKE 1 TABLET BY MOUTH EVERY 6 HOURS AS NEEDED FOR NAUSEA OR VOMITING. 30 tablet 0   rosuvastatin (CRESTOR) 10 MG tablet Take 1 tablet (10 mg total) by mouth daily. 90 tablet 3   spironolactone (ALDACTONE) 25 MG tablet TAKE 1 TABLET BY MOUTH EVERY DAY 90 tablet 3   sulfamethoxazole-trimethoprim (BACTRIM DS) 800-160 MG tablet Take 1 tablet by mouth See admin instructions. Take 1 tablet twice daily on Saturdays and Sundays ONLY     torsemide (DEMADEX) 20 MG tablet Take 2 tablets (40 mg total) by mouth daily. 60 tablet 11   valACYclovir (VALTREX) 500 MG tablet Take 500 mg by mouth 2 (two) times daily.     No current facility-administered  medications for this visit.   Facility-Administered Medications Ordered in Other Visits  Medication Dose Route Frequency Provider Last Rate Last Admin   heparin lock flush 100 unit/mL  500 Units Intracatheter Once PRN Niley Helbig, MD       sodium chloride flush (NS) 0.9 % injection 10 mL  10 mL Intracatheter PRN Baneen Wieseler, MD   10 mL at 03/27/23 1455    SURGICAL HISTORY:  Past Surgical History:  Procedure Laterality Date   ABDOMINAL HYSTERECTOMY  1997   CESAREAN SECTION  1983   CHOLECYSTECTOMY N/A 09/25/2020   Procedure: LAPAROSCOPIC CHOLECYSTECTOMY;  Surgeon: Kinsinger, Luke Aaron, MD;  Location: [facility] ORS;  Service: General;  Laterality: N/A;  60 MINUTES   COLONOSCOPY W/ BIOPSIES  12/29/2006   IR IMAGING GUIDED PORT INSERTION  01/15/2019   LAPAROSCOPY     19 91 for emdometriosis   RIGHT/LEFT HEART CATH AND CORONARY ANGIOGRAPHY N/A 06/25/2019   Procedure: RIGHT/LEFT HEART CATH  AND CORONARY ANGIOGRAPHY;  Surgeon: Dolores Patty, MD;  Location: [facility] INVASIVE CV LAB;  Service: Cardiovascular;  Laterality: N/A;   TEE WITHOUT CARDIOVERSION N/A 07/04/2021   Procedure: TRANSESOPHAGEAL ECHOCARDIOGRAM (TEE);  Surgeon: Dolores Patty, MD;  Location: [facility] ENDOSCOPY;  Service: Cardiovascular;  Laterality: N/A;   UPPER GASTROINTESTINAL ENDOSCOPY      REVIEW OF SYSTEMS:  Constitutional: positive for anorexia, fatigue, and weight loss Eyes: negative Ears, nose, mouth, throat, and face: negative Respiratory: negative Cardiovascular: negative Gastrointestinal: positive for nausea Genitourinary:negative Integument/breast: negative Hematologic/lymphatic: negative Musculoskeletal:negative Neurological: negative Behavioral/Psych: negative Endocrine: negative Allergic/Immunologic: negative   PHYSICAL EXAMINATION: General appearance: alert, cooperative, fatigued, and no distress Head: Normocephalic, without obvious abnormality, atraumatic Neck: no adenopathy, no JVD, supple,  symmetrical, trachea midline, and thyroid not enlarged, symmetric, no tenderness/mass/nodules Lymph nodes: Cervical, supraclavicular, and axillary nodes normal. Resp: clear to auscultation bilaterally Back: symmetric, no curvature. ROM normal. No CVA tenderness. Cardio: regular rate and rhythm, S1, S2 normal, no murmur, click, rub or gallop GI: soft, non-tender; bowel sounds normal; no masses,  no organomegaly Extremities: extremities normal, atraumatic, no cyanosis or edema Neurologic: Alert and oriented X 3, normal strength and tone. Normal symmetric reflexes. Normal coordination and gait  ECOG PERFORMANCE STATUS: 1 - Symptomatic but completely ambulatory  Blood pressure (!) 89/56, pulse 75, temperature 97.8 F (36.6 C), temperature source Temporal, resp. rate 15, weight 178 lb 11.2 oz (81.1 kg), SpO2 92%.  LABORATORY DATA: Lab Results  Component Value Date   WBC 3.9 (L) 11/12/2022   HGB 13.4 11/12/2022   HCT 41.4 11/12/2022   MCV 84.1 11/12/2022   PLT 274 11/12/2022      Chemistry      Component Value Date/Time   NA 139 12/13/2022 0956   K 3.7 12/13/2022 0956   CL 95 (L) 12/13/2022 0956   CO2 34 (H) 12/13/2022 0956   BUN 22 12/13/2022 0956   CREATININE 1.06 (H) 12/13/2022 0956      Component Value Date/Time   CALCIUM 9.6 12/13/2022 0956   ALKPHOS 89 11/12/2022 1310   AST 20 12/13/2022 0956   AST 24 11/12/2022 1310   ALT 17 12/13/2022 0956   ALT 21 11/12/2022 1310   BILITOT 0.5 12/13/2022 0956   BILITOT 0.5 11/12/2022 1310       RADIOGRAPHIC STUDIES: No results found.   ASSESSMENT AND PLAN: This is a very pleasant 68 years old white female recently diagnosed with stage IV less aggressive mantle cell lymphoma presented with left iliac and retroperitoneal lymphadenopathy as well as left axillary and bone marrow involvement in December 2020. The patient completed 6 cycles of systemic chemotherapy with R-CHOP.  She tolerated her treatment fairly well with no  concerning adverse effects but few months later she developed congestive heart failure with drop in her ejection fraction to the 20-25%.  This has improved over the last few months and currently around 55%.  She is followed by Dr. Gala Romney. Unfortunately repeat CT scan in June 2022 showed evidence for disease progression with mild increase in the small bilateral axillary lymph nodes and moderate increase in the left supraclavicular as well as ileocolic mesenteric lymph nodes and left external iliac lymph nodes but mild increase in the periaortic retroperitoneal lymph nodes. The patient is started treatment with acalabrutinib 100 mg p.o. twice daily on June 17, 2020  She was seen by Dr. Lorenza Chick at [facility] on July 12, 2020 for discussion of potential autologous stem cell transplant.  The patient had repeat CT scan of the chest, abdomen pelvis performed recently that showed decrease in the size of mesenteric lymph node/mass of the right hemiabdomen, left external iliac lymph nodes and lymph nodes adjacent to the pancreatic tail/splenic hilum but there was mild increase in the size of the mediastinal paraesophageal nodes and retroperitoneal periaortic lymph node as well as increase in the size of the spleen. 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    Stage IV Mantle Cell Lymphoma Stage IV mantle cell lymphoma diagnosed in December 2020. She completed six cycles of R-CHOP chemotherapy, followed by acalabrutinib and CAR T-cell therapy with Tecartus in January 2023. Currently experiencing weakness, anorexia, weight loss, and abdominal pain, raising concerns for disease progression. Recent labs indicate a decrease in white blood cell count from 3.9 in November to 3.1, with hemoglobin at 12.0 and platelets at  422,000. A CT scan is necessary to evaluate for disease progression or other complications. - Order CT scan of chest, abdomen, and pelvis in 2 weeks - Arrange follow-up appointment 10-15 days after scan to review results  Abdominal Pain Persistent abdominal pain in the left upper quadrant and lower abdomen, exacerbated by eating or drinking. Differential diagnosis includes lymphoma complications or treatment-related side effects. Unlikely related to previous cholecystectomy.  Nausea Intermittent nausea, possibly related to lymphoma or treatment side effects. Previously prescribed Zofran for management. Current plan includes administering fluids and Zofran to manage symptoms. - Administer half a liter of normal saline with Zofran in clinic - Prescribe Zofran for home use  Fatigue Increased fatigue following recent influenza and streptococcal pharyngitis infections. Possibly related to lymphoma or treatment side effects. Fatigue has worsened since recent infections, necessitating management.  Follow-up Follow-up required to monitor lymphoma status and address ongoing symptoms. Coordination with previous care team at [facility] is complete, and current management is under local care. - Schedule follow-up appointment in 3-4 weeks after scan results are available     For the insomnia, I will give her a refill of Restoril 15 mg p.o. nightly. The patient was advised to call immediately if she has any concerning symptoms in the interval. The patient voices understanding of current disease status and treatment options and is in agreement with the current care plan.  All questions were answered. The patient knows to call the clinic with any problems, questions or concerns. We can certainly see the patient much sooner if necessary.  Disclaimer: This note was dictated with voice recognition software. Similar sounding words can inadvertently be transcribed and may not be corrected upon review.

## 2023-03-27 NOTE — Progress Notes (Signed)
 Patients port flushed without difficulty.  Good blood return noted with no bruising or swelling noted at site. Patient c/o nausea and vomiting prior to accessing port. Message sent to Dr Arbutus Ped. Patient remaining accessed for possible treatment.

## 2023-03-27 NOTE — Patient Instructions (Signed)

## 2023-04-02 ENCOUNTER — Other Ambulatory Visit (HOSPITAL_COMMUNITY): Payer: Self-pay

## 2023-04-02 ENCOUNTER — Other Ambulatory Visit (HOSPITAL_COMMUNITY): Payer: Self-pay | Admitting: Internal Medicine

## 2023-04-03 ENCOUNTER — Encounter: Payer: Self-pay | Admitting: Internal Medicine

## 2023-04-05 NOTE — Progress Notes (Unsigned)
 Office Note 04/07/2023  CC:  Chief Complaint  Patient presents with   Establish Care   Medical Management of Chronic Issues    HPI:  Natalie Baker is a 68 y.o. female who is here to establish care, CPE, follow-up hyperlipidemia and history of arm DVT. Patient's most recent primary MD: Dr. Lucky Cowboy Old records in EPIC/[facility] EMR were reviewed prior to or during today's visit.  Currently feeling well, no acute concerns.  Current level of activity is walking on flat surfaces.  No formal exercise.  She has to rest halfway up any flight of stairs. No recent edema. Potassium was 3.1 on 03/27/2023.  She takes 40 mill equivalents of potassium supplement every day. It appears she was prescribed an extra 20 mEq to be taken once a day for 7 days but she states she has not done that. Potassium constipates her.  Rosuvastatin 5 mg a day made her feel fatigued so she discontinued it.  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  Has undergone chemotherapy.  Current therapy is observation (since January 2023). She has some left upper quadrant pain, bloating, and nausea that is mild and chronic.   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 [facility] and others   Obesity    BMI 34   Osteoporosis    most recent DEXA 02/2022   Palpitations    Pre-diabetes    Secondary nonischemic congestive cardiomyopathy (HCC)    Adriamycin 2020   Ventricular tachycardia (HCC)    Vitamin D deficiency     Past Surgical History:  Procedure Laterality Date   ABDOMINAL HYSTERECTOMY  1997   CESAREAN SECTION  1981    CHOLECYSTECTOMY N/A 09/25/2020   Procedure: LAPAROSCOPIC CHOLECYSTECTOMY;  Surgeon: Rodman Pickle, MD;  Location: [facility];  Service: General;  Laterality: N/A;  60 MINUTES   COLONOSCOPY     03/2018 NO POLYPS   COLONOSCOPY W/ BIOPSIES  12/29/2006   IR IMAGING GUIDED PORT INSERTION  01/15/2019   LAPAROSCOPY     1991 for emdometriosis   RIGHT/LEFT HEART CATH AND CORONARY ANGIOGRAPHY N/A 06/25/2019   Procedure: RIGHT/LEFT HEART CATH AND CORONARY ANGIOGRAPHY;  Surgeon: Dolores Patty, MD;  Location: [facility];  Service: Cardiovascular;  Laterality: N/A;   TEE WITHOUT CARDIOVERSION N/A 07/04/2021   Procedure: TRANSESOPHAGEAL ECHOCARDIOGRAM (TEE);  Surgeon: Dolores Patty, MD;  Location: [facility] ENDOSCOPY;  Service: Cardiovascular;  Laterality: N/A;   TRANSTHORACIC ECHOCARDIOGRAM     05/17/22 ejection fraction 35% (unchanged from 2023), global hypokinesis, mild RV dysfunction, mild to moderate MR.   UPPER GASTROINTESTINAL ENDOSCOPY      Family History  Problem Relation Age of Onset   Heart disease Mother 34       chf   Diabetes Mother    Thyroid disease Mother    Cancer Mother 7       female/ skin   Hypertension Mother    Liver disease Mother    Hypertension Father    Alcohol abuse Father    Heart disease Father    Breast cancer  Sister 76   Diabetes Sister    Hypothyroidism Sister    Hashimoto's thyroiditis Sister    Hyperlipidemia Brother    Hypertension Brother        x 2   Diabetes Brother    Hypothyroidism Brother    Prostate cancer Brother 64   Liver disease Maternal Grandmother    Diabetes Paternal Grandmother    Breast cancer Maternal Aunt    Hyperlipidemia Maternal Aunt    Heart disease Maternal Aunt    Bone cancer Maternal Aunt        metastatic   Breast cancer Niece 73   Colon cancer Neg Hx    Colon polyps Neg Hx    Esophageal cancer Neg Hx    Rectal cancer Neg Hx    Stomach cancer Neg Hx     Social History   Socioeconomic History    Marital status: Married    Spouse name: Not on file   Number of children: 1   Years of education: Not on file   Highest education level: Not on file  Occupational History   Occupation: Marine scientist  Tobacco Use   Smoking status: Former    Current packs/day: 0.00    Types: Cigarettes    Start date: 01/07/1981    Quit date: 01/08/1996    Years since quitting: 27.2   Smokeless tobacco: Never   Tobacco comments:    quit 1998 but is exposed to 2nd hand smoke  Vaping Use   Vaping status: Never Used  Substance and Sexual Activity   Alcohol use: No   Drug use: No   Sexual activity: Yes    Partners: Male    Birth control/protection: Post-menopausal  Other Topics Concern   Not on file  Social History Narrative   Married, one daughter,    Educ: HS   Occup:  employed as an Conservator, museum/gallery.   No tob or alc   Social Drivers of Corporate investment banker Strain: Low Risk  (03/22/2021)   Overall Financial Resource Strain (CARDIA)    Difficulty of Paying Living Expenses: Not very hard  Food Insecurity: No Food Insecurity (03/22/2021)   Hunger Vital Sign    Worried About Running Out of Food in the Last Year: Never true    Ran Out of Food in the Last Year: Never true  Transportation Needs: No Transportation Needs (03/22/2021)   PRAPARE - Administrator, Civil Service (Medical): No    Lack of Transportation (Non-Medical): No  Physical Activity: Inactive (08/13/2018)   Exercise Vital Sign    Days of Exercise per Week: 0 days    Minutes of Exercise per Session: 0 min  Stress: No Stress Concern Present (08/13/2018)   [employer] of Occupational Health - Occupational Stress Questionnaire    Feeling of Stress : Only a little  Social Connections: Moderately Integrated (07/02/2019)   Social Connection and Isolation Panel [NHANES]    Frequency of Communication with Friends and Family: More than three times a week    Frequency of Social Gatherings with Friends and Family: Once a  week    Attends Religious Services: More than 4 times per year    Active Member of [employer] or Organizations: No    Attends Banker Meetings: Never    Marital Status: Married  Catering manager Violence: Not on file    Outpatient Encounter Medications as of 04/07/2023  Medication Sig   amiodarone (PACERONE) 200 MG tablet Take 0.5 tablets (100  mg total) by mouth daily.   Cholecalciferol (VITAMIN D-3) 25 MCG (1000 UT) CAPS Take 1 capsule by mouth daily.   ELIQUIS 5 MG TABS tablet TAKE 1 TABLET BY MOUTH TWICE A DAY   estradiol (VIVELLE-DOT) 0.05 MG/24HR patch Place 1 patch onto the skin 2 (two) times a week. Tuesdays & Fridays   FARXIGA 10 MG TABS tablet TAKE 1 TABLET BY MOUTH EVERY DAY   ivabradine (CORLANOR) 7.5 MG TABS tablet Take 1 tablet (7.5 mg total) by mouth 2 (two) times daily with a meal. NEEDS FOLLOW UP APPOINTMENT FOR MORE REFILLS   levothyroxine (SYNTHROID) 100 MCG tablet TAKE 1 TABLET BY MOUTH DAILY ON AN EMPTY STOMACH WITH ONLY WATER FOR 30 MINUTES & NO ANTACID/CALCIUM/MAGNESIUM FOR 4 HOURS & AVOID BIOTIN   losartan (COZAAR) 25 MG tablet Take 0.5 tablets (12.5 mg total) by mouth daily.   ondansetron (ZOFRAN-ODT) 4 MG disintegrating tablet Take 1 tablet (4 mg total) by mouth every 8 (eight) hours as needed for nausea or vomiting.   potassium chloride SA (KLOR-CON M) 20 MEQ tablet Take 2 tablets (40 mEq total) by mouth daily.   pravastatin (PRAVACHOL) 20 MG tablet Take 1 tablet (20 mg total) by mouth daily.   prochlorperazine (COMPAZINE) 10 MG tablet TAKE 1 TABLET BY MOUTH EVERY 6 HOURS AS NEEDED FOR NAUSEA OR VOMITING.   spironolactone (ALDACTONE) 25 MG tablet TAKE 1 TABLET BY MOUTH EVERY DAY   sulfamethoxazole-trimethoprim (BACTRIM DS) 800-160 MG tablet Take 1 tablet by mouth See admin instructions. Take 1 tablet twice daily on Saturdays and Sundays ONLY   torsemide (DEMADEX) 20 MG tablet Take 2 tablets (40 mg total) by mouth daily.   valACYclovir (VALTREX) 500 MG tablet  Take 500 mg by mouth 2 (two) times daily.   [DISCONTINUED] potassium chloride SA (KLOR-CON M) 20 MEQ tablet Take 1 tablet (20 mEq total) by mouth daily.   [DISCONTINUED] rosuvastatin (CRESTOR) 10 MG tablet Take 1 tablet (10 mg total) by mouth daily.   No facility-administered encounter medications on file as of 04/07/2023.    Allergies  Allergen Reactions   Adriamycin [Doxorubicin] Other (See Comments)    Acute heart failure (chemo from Jan - May 2021)   Ppd [Tuberculin Purified Protein Derivative] Swelling    Positive reaction.  Negative Chest Xray 06/16/12    Review of Systems  Constitutional:  Positive for fatigue. Negative for appetite change, chills and fever.  HENT:  Negative for congestion, dental problem, ear pain and sore throat.   Eyes:  Negative for discharge, redness and visual disturbance.  Respiratory:  Negative for cough, chest tightness, shortness of breath and wheezing.   Cardiovascular:  Negative for chest pain, palpitations and leg swelling.  Gastrointestinal:  Positive for abdominal pain, constipation and rectal pain. Negative for blood in stool, diarrhea, nausea and vomiting.  Genitourinary:  Negative for difficulty urinating, dysuria, flank pain, frequency, hematuria and urgency.  Musculoskeletal:  Negative for arthralgias, back pain, joint swelling, myalgias and neck stiffness.  Skin:  Negative for pallor and rash.  Neurological:  Negative for dizziness, speech difficulty, weakness and headaches.  Hematological:  Negative for adenopathy. Does not bruise/bleed easily.  Psychiatric/Behavioral:  Negative for confusion and sleep disturbance. The patient is not nervous/anxious.     PE; Blood pressure 113/60, pulse 83, height 5\' 2"  (1.575 m), weight 180 lb (81.6 kg), SpO2 95%. Body mass index is 32.92 kg/m.  Physical Exam  Exam chaperoned by Emi Holes, CMA. Gen: Alert, well appearing.  Patient is  oriented to person, place, time, and situation. AFFECT:  pleasant, lucid thought and speech. ENT: Ears: EACs clear, normal epithelium.  TMs with good light reflex and landmarks bilaterally.  Eyes: no injection, icteris, swelling, or exudate.  EOMI, PERRLA. Nose: no drainage or turbinate edema/swelling.  No injection or focal lesion.  Mouth: lips without lesion/swelling.  Oral mucosa pink and moist.  Dentition intact and without obvious caries or gingival swelling.  Oropharynx without erythema, exudate, or swelling.  Neck: supple/nontender.  No LAD, mass, or TM.  Carotid pulses 2+ bilaterally, without bruits. CV: RRR, distant S1 and S2.  I could not hear murmur.   LUNGS: CTA bilat, nonlabored resps, good aeration in all lung fields. ABD: soft, NT, ND, BS normal.  No hepatospenomegaly or mass.  No bruits. EXT: no clubbing, cyanosis, or edema.  Musculoskeletal: no joint swelling, erythema, warmth, or tenderness.  ROM of all joints intact. Skin - no sores or suspicious lesions or rashes or color changes  Pertinent labs:  Last CBC Lab Results  Component Value Date   WBC 3.1 (L) 03/27/2023   HGB 12.0 03/27/2023   HCT 36.6 03/27/2023   MCV 86.1 03/27/2023   MCH 28.2 03/27/2023   RDW 15.9 (H) 03/27/2023   PLT 422 (H) 03/27/2023   Last metabolic panel Lab Results  Component Value Date   GLUCOSE 84 03/27/2023   NA 136 03/27/2023   K 3.1 (L) 03/27/2023   CL 93 (L) 03/27/2023   CO2 34 (H) 03/27/2023   BUN 10 03/27/2023   CREATININE 1.02 (H) 03/27/2023   GFRNONAA >60 03/27/2023   CALCIUM 8.9 03/27/2023   PHOS 3.8 06/25/2019   PROT 6.5 03/27/2023   ALBUMIN 3.6 03/27/2023   BILITOT 0.4 03/27/2023   ALKPHOS 120 03/27/2023   AST 21 03/27/2023   ALT 13 03/27/2023   ANIONGAP 9 03/27/2023   Last lipids Lab Results  Component Value Date   CHOL 254 (H) 12/13/2022   HDL 67 12/13/2022   LDLCALC 165 (H) 12/13/2022   TRIG 107 12/13/2022   CHOLHDL 3.8 12/13/2022   Last hemoglobin A1c Lab Results  Component Value Date   HGBA1C 5.8 (H)  12/13/2022   Last thyroid functions Lab Results  Component Value Date   TSH 2.99 12/13/2022   T4TOTAL 5.3 06/24/2019   Last vitamin D Lab Results  Component Value Date   VD25OH 22 (L) 12/12/2021   Last vitamin B12 and Folate Lab Results  Component Value Date   VITAMINB12 406 12/13/2022   ASSESSMENT AND PLAN:   New patient, establishing care.  #1 health maintenance exam: Reviewed age and gender appropriate health maintenance issues (prudent diet, regular exercise, health risks of tobacco and excessive alcohol, use of seatbelts, fire alarms in home, use of sunscreen).  Also reviewed age and gender appropriate health screening as well as vaccine recommendations. Vaccines:  Shingrix--> deferred--> she will ask her oncologist about this.   Cervical ca screening: n/a->remote hx of hysterectomy. Breast ca screening: December 2023 mammogram.  She has plan to follow-up with GYN soon and get repeat.   Colon ca screening: normal TCS 2020-->recall 10 yrs.   #2 hypercholesterolemia. Her last LDL was 165 on 12/13/2022. She did not tolerate rosuvastatin 5 mg. Will do trial of pravastatin 20 mg daily and recheck lipids in 3 months.  #3 hypokalemia. She feels like she is on the maximum tolerable dose of KlorCon, 2 of the 20 mill equivalent tabs daily. Reviewed high potassium diet and gave handout today.  4.  Acute left arm DVT March 2023. Given her malignancy (even though it is currently not clearly recurrent/progressing) I think she is at high risk for recurrent thrombosis so we will keep her on Eliquis 5 mg twice daily.  #5 secondary nonischemic cardiomyopathy (adriamycin).  Stable class II/III. Followed by CHF clinic: Current regimen Farxiga 10 mg a day, Corlanor 7.5 mg twice daily, losartan 12.5 mg daily, spironolactone 25 mg daily, and torsemide 40 mg daily.  #6 history of V. tach, PVCs. Continue amiodarone 100 mg a day as per cardiology.  #7 hypothyroidism. Continue levothyroxine  100 mcg daily. Will check TSH at next follow-up in 3 months.  An After Visit Summary was printed and given to the patient.  Return in about 3 months (around 07/07/2023).  Signed:  Santiago Bumpers, MD           04/07/2023

## 2023-04-07 ENCOUNTER — Encounter: Payer: Self-pay | Admitting: Family Medicine

## 2023-04-07 ENCOUNTER — Ambulatory Visit (INDEPENDENT_AMBULATORY_CARE_PROVIDER_SITE_OTHER): Admitting: Family Medicine

## 2023-04-07 VITALS — BP 113/60 | HR 83 | Ht 62.0 in | Wt 180.0 lb

## 2023-04-07 DIAGNOSIS — Z0000 Encounter for general adult medical examination without abnormal findings: Secondary | ICD-10-CM | POA: Diagnosis not present

## 2023-04-07 DIAGNOSIS — I472 Ventricular tachycardia, unspecified: Secondary | ICD-10-CM

## 2023-04-07 DIAGNOSIS — E039 Hypothyroidism, unspecified: Secondary | ICD-10-CM

## 2023-04-07 DIAGNOSIS — E7800 Pure hypercholesterolemia, unspecified: Secondary | ICD-10-CM | POA: Diagnosis not present

## 2023-04-07 DIAGNOSIS — E876 Hypokalemia: Secondary | ICD-10-CM | POA: Diagnosis not present

## 2023-04-07 DIAGNOSIS — I420 Dilated cardiomyopathy: Secondary | ICD-10-CM

## 2023-04-07 DIAGNOSIS — I82622 Acute embolism and thrombosis of deep veins of left upper extremity: Secondary | ICD-10-CM | POA: Diagnosis not present

## 2023-04-07 MED ORDER — PRAVASTATIN SODIUM 20 MG PO TABS: 20.0000 mg | ORAL_TABLET | Freq: Every day | ORAL | 0 refills | Status: DC

## 2023-04-10 ENCOUNTER — Other Ambulatory Visit: Payer: Self-pay | Admitting: Internal Medicine

## 2023-04-10 ENCOUNTER — Ambulatory Visit (HOSPITAL_COMMUNITY)
Admission: RE | Admit: 2023-04-10 | Discharge: 2023-04-10 | Disposition: A | Discharge status: Home / Self Care | Source: Ambulatory Visit | Attending: Internal Medicine | Admitting: Internal Medicine

## 2023-04-10 ENCOUNTER — Ambulatory Visit: Payer: Medicare Other | Admitting: Internal Medicine

## 2023-04-10 DIAGNOSIS — C8313 Mantle cell lymphoma, intra-abdominal lymph nodes: Secondary | ICD-10-CM | POA: Diagnosis present

## 2023-04-10 MED ORDER — IOHEXOL 300 MG/ML  SOLN
100.0000 mL | Freq: Once | INTRAMUSCULAR | Status: AC | PRN
  Administered 2023-04-10: 100 mL via INTRAVENOUS

## 2023-04-10 MED ORDER — IOHEXOL 9 MG/ML PO SOLN
ORAL | Status: AC
  Filled 2023-04-10: qty 1000

## 2023-04-10 MED ORDER — SODIUM CHLORIDE (PF) 0.9 % IJ SOLN
INTRAMUSCULAR | Status: AC
  Filled 2023-04-10: qty 50

## 2023-04-10 MED ORDER — IOHEXOL 9 MG/ML PO SOLN
1000.0000 mL | ORAL | Status: AC
  Administered 2023-04-10: 1000 mL via ORAL

## 2023-04-14 ENCOUNTER — Other Ambulatory Visit (HOSPITAL_COMMUNITY): Payer: Self-pay

## 2023-04-17 ENCOUNTER — Ambulatory Visit: Admitting: Family Medicine

## 2023-04-21 ENCOUNTER — Other Ambulatory Visit (HOSPITAL_COMMUNITY): Payer: Self-pay

## 2023-04-21 ENCOUNTER — Other Ambulatory Visit: Payer: Self-pay

## 2023-04-25 ENCOUNTER — Other Ambulatory Visit (HOSPITAL_COMMUNITY): Payer: Self-pay | Admitting: Cardiology

## 2023-04-25 DIAGNOSIS — I5022 Chronic systolic (congestive) heart failure: Secondary | ICD-10-CM

## 2023-04-25 MED ORDER — LOSARTAN POTASSIUM 25 MG PO TABS: 12.5000 mg | ORAL_TABLET | Freq: Every day | ORAL | 3 refills | Status: DC

## 2023-04-25 MED ORDER — TORSEMIDE 20 MG PO TABS: 40.0000 mg | ORAL_TABLET | Freq: Every day | ORAL | 11 refills | Status: DC

## 2023-04-29 NOTE — Progress Notes (Unsigned)
 [facility] OFFICE PROGRESS NOTE  Natalie Dick, MD [address] [place] [address]  DIAGNOSIS: Stage IV non-Hodgkin's lymphoma, mantle cell lymphoma.  She presented with left iliac and retroperitoneal lymphadenopathy.  She also presented with left axillary lymphadenopathy and bone marrow involvement.  She was diagnosed in December 2020.   PRIOR THERAPY: 1) Systemic chemotherapy with R-CHOP with Neulasta  support.  First dose on 01/27/2019. Status post 6 cycles.   2) Acalabrutinib  100 mg p.o. twice daily.  First dose started June 17, 2020.  Status post 3 months of treatment. 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 female returns to the clinic today for a follow-up visit. Her daughter is available by phone.  The patient was last seen in clinic by Dr. Marguerita Baker on 03/27/2023.  The patient is followed for her history of mantle cell lymphoma.  In summary the patient was diagnosed with mantle cell lymphoma in 2020.  She was treated with R-CHOP chemotherapy followed by acalabrutinib  for 3 months followed by CAR-T cell therapy at [facility] with Tecartus . This was completed in January 2023 and she has been on observation since that time.  When she was last seen by Dr. Marguerita Baker last month she was endorsing weakness and 15 pound weight loss since January.  Of note the patient had the flu in January 2025.  She received IV fluids and antiemetics and felt significantly improved at that time.  The patient is wondering if she can save additional IV fluids at this time.  She would be willing to come in later this week or next week if needed.  The patient  has been experiencing abdominal pain and nausea when she eats or drinks which is what led to the IV fluids at her last appointment.  She does not have a gastroenterologist.  She would be open to seeing one.  Per chart review she had a cholecystectomy prior to her CAR-T cell therapy.  The patient denies any major changes in her health since she was last seen.  She is feeling somewhat better than how she is feeling at her last appointment.  She denies any fever, chills, or any additional weight loss.  She has some baseline dyspnea on exertion which is unchanged over the last few months.  She denies any significant cough.  She follows closely with cardiology due to heart failure during the course of her R-CHOP chemotherapy and she has a follow-up appointment with her cardiologist next month.  She denies any diarrhea.  She sometimes has constipation for which she will take stool softener, Senokot, or Metamucil.  She denies any palpable lymphadenopathy.  She is here today for evaluation and to review her CT scan results.   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 CHOP and others   Obesity  BMI 34   Osteoporosis    most recent DEXA 02/2022   Pre-diabetes    Secondary nonischemic congestive cardiomyopathy (HCC)    Adriamycin  2020   Ventricular tachycardia (HCC)    Vitamin D  deficiency     ALLERGIES:  is allergic to adriamycin  [doxorubicin ] and ppd [tuberculin purified protein derivative].  MEDICATIONS:  Current Outpatient Medications  Medication Sig Dispense Refill   amiodarone  (PACERONE ) 200 MG tablet Take 0.5 tablets (100 mg total) by mouth daily. 45  tablet 3   Cholecalciferol  (VITAMIN D -3) 25 MCG (1000 UT) CAPS Take 1 capsule by mouth daily.     ELIQUIS  5 MG TABS tablet TAKE 1 TABLET BY MOUTH TWICE A DAY 60 tablet 6   estradiol  (VIVELLE -DOT) 0.05 MG/24HR patch Place 1 patch onto the skin 2 (two) times a week. Tuesdays & Fridays     FARXIGA  10 MG TABS tablet TAKE 1 TABLET BY MOUTH EVERY DAY 30 tablet 11   ivabradine  (CORLANOR ) 7.5 MG TABS tablet Take 1 tablet (7.5 mg total) by mouth 2 (two) times daily with a meal. NEEDS FOLLOW UP APPOINTMENT FOR MORE REFILLS 60 tablet 1   levothyroxine  (SYNTHROID ) 100 MCG tablet TAKE 1 TABLET BY MOUTH DAILY ON AN EMPTY STOMACH WITH ONLY WATER FOR 30 MINUTES & NO ANTACID/CALCIUM /MAGNESIUM  FOR 4 HOURS & AVOID BIOTIN 90 tablet 0   losartan  (COZAAR ) 25 MG tablet Take 0.5 tablets (12.5 mg total) by mouth daily. 45 tablet 3   ondansetron  (ZOFRAN -ODT) 4 MG disintegrating tablet Take 1 tablet (4 mg total) by mouth every 8 (eight) hours as needed for nausea or vomiting. 30 tablet 3   potassium chloride  SA (KLOR-CON  M) 20 MEQ tablet Take 2 tablets (40 mEq total) by mouth daily. 30 tablet 6   pravastatin  (PRAVACHOL ) 20 MG tablet Take 1 tablet (20 mg total) by mouth daily. 90 tablet 0   prochlorperazine  (COMPAZINE ) 10 MG tablet TAKE 1 TABLET BY MOUTH EVERY 6 HOURS AS NEEDED FOR NAUSEA OR VOMITING. 30 tablet 0   spironolactone  (ALDACTONE ) 25 MG tablet TAKE 1 TABLET BY MOUTH EVERY DAY 90 tablet 3   sulfamethoxazole -trimethoprim  (BACTRIM  DS) 800-160 MG tablet Take 1 tablet by mouth See admin instructions. Take 1 tablet twice daily on Saturdays and Sundays ONLY     torsemide (DEMADEX) 20 MG tablet Take 2 tablets (40 mg total) by mouth daily. 60 tablet 11   valACYclovir (VALTREX) 500 MG tablet Take 500 mg by mouth 2 (two) times daily.     No current facility-administered medications for this visit.    SURGICAL HISTORY:  Past Surgical History:  Procedure Laterality Date   ABDOMINAL HYSTERECTOMY  1997   CESAREAN SECTION   1981   CHOLECYSTECTOMY N/A 09/25/2020   Procedure: LAPAROSCOPIC CHOLECYSTECTOMY;  Surgeon: Kinsinger, Luke Aaron, MD;  Location: [facility] ORS;  Service: General;  Laterality: N/A;  60 MINUTES   COLONOSCOPY     03/2018 NO POLYPS   COLONOSCOPY W/ BIOPSIES  12/29/2006   IR IMAGING GUIDED PORT INSERTION  01/15/2019   LAPAROSCOPY     19 91 for emdometriosis   RIGHT/LEFT HEART CATH AND CORONARY ANGIOGRAPHY N/A 06/25/2019   Procedure: RIGHT/LEFT HEART CATH AND CORONARY ANGIOGRAPHY;  Surgeon: Mardell Shade, MD;  Location: [facility] INVASIVE CV LAB;  Service: Cardiovascular;  Laterality: N/A;   TEE WITHOUT CARDIOVERSION N/A 07/04/2021   Procedure: TRANSESOPHAGEAL ECHOCARDIOGRAM (TEE);  Surgeon: Mardell Shade, MD;  Location: [facility] ENDOSCOPY;  Service: Cardiovascular;  Laterality: N/A;   TRANSTHORACIC ECHOCARDIOGRAM  05/17/22 ejection fraction 35% (unchanged from 2023), global hypokinesis, mild RV dysfunction, mild to moderate MR.   UPPER GASTROINTESTINAL ENDOSCOPY      REVIEW OF SYSTEMS:   Review of Systems  Constitutional: Positive for fatigue, generalized weakness and weight loss since January. Negative for chills and fever.  HENT: Negative for mouth sores, nosebleeds, sore throat and trouble swallowing.   Eyes: Negative for eye problems and icterus.  Respiratory: Positive for stable occasional dyspnea on exertion. Negative for cough, hemoptysis,  and wheezing.   Cardiovascular: Negative for chest pain and leg swelling.  Gastrointestinal: Positive for nausea/vomiting intermittently and occasional constipation. Negative for diarrhea.  Genitourinary: Negative for bladder incontinence, difficulty urinating, dysuria, frequency and hematuria.   Musculoskeletal: Negative for back pain, gait problem, neck pain and neck stiffness.  Skin: Negative for itching and rash.  Neurological: Negative for dizziness, extremity weakness, gait problem, headaches, light-headedness and seizures.  Hematological: Negative  for adenopathy. Does not bruise/bleed easily.  Psychiatric/Behavioral: Negative for confusion, depression and sleep disturbance. The patient is not nervous/anxious.     PHYSICAL EXAMINATION:  There were no vitals taken for this visit.  ECOG PERFORMANCE STATUS: 1  Physical Exam  Constitutional: Oriented to person, place, and time and well-developed, well-nourished, and in no distress.  HENT:  Head: Normocephalic and atraumatic.  Mouth/Throat: Oropharynx is clear and moist. No oropharyngeal exudate.  Eyes: Conjunctivae are normal. Right eye exhibits no discharge. Left eye exhibits no discharge. No scleral icterus.  Neck: Normal range of motion. Neck supple.  Cardiovascular: Normal rate, regular rhythm, normal heart sounds and intact distal pulses.   Pulmonary/Chest: Effort normal and breath sounds normal. No respiratory distress. No wheezes. No rales.  Abdominal: Soft. Bowel sounds are normal. Exhibits no distension and no mass. There is no tenderness.  Musculoskeletal: Normal range of motion. Exhibits no edema.  Lymphadenopathy:    No cervical adenopathy.  Neurological: Alert and oriented to person, place, and time. Exhibits normal muscle tone. Gait normal. Coordination normal.  Skin: Skin is warm and dry. No rash noted. Not diaphoretic. No erythema. No pallor.  Psychiatric: Mood, memory and judgment normal.  Vitals reviewed.  LABORATORY DATA: Lab Results  Component Value Date   WBC 3.1 (L) 03/27/2023   HGB 12.0 03/27/2023   HCT 36.6 03/27/2023   MCV 86.1 03/27/2023   PLT 422 (H) 03/27/2023      Chemistry      Component Value Date/Time   NA 136 03/27/2023 1449   K 3.1 (L) 03/27/2023 1449   CL 93 (L) 03/27/2023 1449   CO2 34 (H) 03/27/2023 1449   BUN 10 03/27/2023 1449   CREATININE 1.02 (H) 03/27/2023 1449   CREATININE 1.06 (H) 12/13/2022 0956      Component Value Date/Time   CALCIUM  8.9 03/27/2023 1449   ALKPHOS 120 03/27/2023 1449   AST 21 03/27/2023 1449   ALT 13  03/27/2023 1449   BILITOT 0.4 03/27/2023 1449       RADIOGRAPHIC STUDIES:  CT CHEST ABDOMEN PELVIS W CONTRAST Result Date: 04/10/2023 CLINICAL DATA:  History of renal cell lymphoma, follow-up/staging. * Tracking Code: BO * EXAM: CT CHEST, ABDOMEN, AND PELVIS WITH CONTRAST TECHNIQUE: Multidetector CT imaging of the chest, abdomen and pelvis was performed following the standard protocol during bolus administration of intravenous contrast. RADIATION DOSE REDUCTION: This exam was performed according to the departmental dose-optimization program which includes automated exposure control, adjustment of the mA and/or kV according to patient size and/or use of iterative reconstruction technique.  CONTRAST:  OMNIPAQUE  IOHEXOL  300 MG/ML  SOLN COMPARISON:  Multiple priors including PET-CT October 31, 2021 and CT chest abdomen and pelvis September 29, 2020 FINDINGS: CT CHEST FINDINGS Cardiovascular: Right chest Port-A-Cath with tip near the superior cavoatrial junction. Aortic atherosclerosis. Normal size heart. No significant pericardial effusion/thickening. Mediastinum/Nodes: No suspicious thyroid  nodule. No pathologically enlarged mediastinal, hilar or axillary lymph nodes. The esophagus is grossly unremarkable. Lungs/Pleura: Bilateral patchy nodular consolidative opacities, with mild interstitial thickening. No pleural effusion. No pneumothorax. Musculoskeletal: No aggressive lytic or blastic lesion of bone. CT ABDOMEN PELVIS FINDINGS Hepatobiliary: No suspicious hepatic lesion. Hyperenhancing subcapsular segment 3 focus measuring 7 mm on image 53/2 is stable dating back to June 09, 2020 compatible with a benign finding such as a hemangioma or intrahepatic shunt. Gallbladder surgically absent. Diffuse hepatic steatosis. Pancreas: No pancreatic ductal dilation or evidence of acute inflammation. Spleen: No splenomegaly. Adrenals/Urinary Tract: No suspicious adrenal nodule/mass. No hydronephrosis. Kidneys  demonstrate symmetric enhancement. Urinary bladder is unremarkable for degree of distension. Stomach/Bowel: Stomach is unremarkable for degree of distension. No pathologic dilation of small or large bowel. No evidence of acute bowel inflammation. Vascular/Lymphatic: Aortic atherosclerosis. Normal caliber abdominal aorta. Smooth IVC contours. The portal, splenic and superior mesenteric veins are patent. No pathologically enlarged abdominal or pelvic lymph nodes. Reproductive: Status post hysterectomy. No adnexal masses. Other: No significant abdominopelvic free fluid. Musculoskeletal: No aggressive lytic or blastic lesion of bone. IMPRESSION: 1. No evidence of lymphadenopathy or splenomegaly. 2. Bilateral patchy nodular consolidative opacities, with mild interstitial thickening, favored infectious/inflammatory. Suggest follow-up dedicated chest CT in 2-3 months to ensure resolution. 3. Diffuse hepatic steatosis. 4. Aortic atherosclerosis. Electronically Signed   By: Tama Fails M.D.   On: 04/10/2023 16:42     ASSESSMENT/PLAN:  This is a very pleasant 68 year old Caucasian female diagnosed with stage IV mantle cell lymphoma.  She presented with left iliac and retroperitoneal lymphadenopathy as well as left axillary and bone marrow involvement in December 2020.  She completed 6 cycles of systemic chemotherapy with R-CHOP.  She tolerated this well without any concerning adverse side effects but a few months later she developed congestive heart failure with her EF dropping to 20 to 25%.  This has improved over the last few months and is currently around 55%.  She is followed by cardiology.  Unfortunately in June 2020 she was found to have disease progression with mild increase in the small bilateral axillary lymph nodes and moderate increase in the left supraclavicular as well as ileocecal mesenteric lymph nodes and left external iliac lymph nodes and mild increase in the periaortic retroperitoneal lymph  nodes.  She was then started on treatment with acalabrutinib  100 mg p.o. twice daily on 06/17/2020.  She was seen by Dr. Katryzna Jaimeson at [facility] on July 12, 2020 for discussion of potential autologous stem cell transplant.   The patient underwent CAR-T with Tecartus on January 23, 2021 after lymphoid ablation with fludarabine and Cytoxan . 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   The patient recently had a restaging CT scan performed.  The patient was seen with Dr. Marguerita Baker today.  Dr. Marguerita Baker personally and independently reviewed the scan and discussed the results with the patient today.  The scan showed no evidence of disease progression. There was no lymphadenopathy or splenomegaly. The scan showed some bilateral patchy nodular opacities. The patient reported strep and flu a few months ago. She denies changes in  her breathing recently. Dr. Marguerita Baker recommends close monitoring.   Dr. Marguerita Baker recommends that she continue on observation with a restaging CT scan in 6 months.  We will refer her to GI due to her persistent abdominal pain. It looks like there is a drug to drug interaction with zofran  and amiodarone . I let her know GI may want to consider changing her anti-emetic to an alternative.   She is requesting additional IVF since it helped her last time. She overall is well appearing today and not acutely ill and is non-toxic appearing. We will check with infusion to schedule 1/2 L of fluid one day this week or next week.   She knows to be seen if she has new or worsening respiratory symptoms. This was favored to be post-infectious/inflammatory. Of note, she has been on amiodarone  for several years. She denies any changes in her pulmonary function. However, she will monitor her breathing.   For insomnia she will continue to take Restoril .  The patient was advised to call immediately if she has any concerning  symptoms in the interval. The patient voices understanding of current disease status and treatment options and is in agreement with the current care plan. All questions were answered. The patient knows to call the clinic with any problems, questions or concerns. We can certainly see the patient much sooner if necessary    No orders of the defined types were placed in this encounter.    I spent {CHL ONC TIME VISIT - ZOXWR:[id number]} counseling the patient face to face. The total time spent in the appointment was {CHL ONC TIME VISIT - BJYNW:[id number]}.  Awab Abebe L Icy Fuhrmann, PA-C 04/29/23

## 2023-04-30 ENCOUNTER — Other Ambulatory Visit (HOSPITAL_COMMUNITY): Payer: Self-pay | Admitting: Family Medicine

## 2023-04-30 ENCOUNTER — Other Ambulatory Visit (HOSPITAL_COMMUNITY): Payer: Self-pay

## 2023-04-30 DIAGNOSIS — I5022 Chronic systolic (congestive) heart failure: Secondary | ICD-10-CM

## 2023-05-01 ENCOUNTER — Other Ambulatory Visit

## 2023-05-01 ENCOUNTER — Inpatient Hospital Stay: Attending: Internal Medicine | Admitting: Physician Assistant

## 2023-05-01 ENCOUNTER — Inpatient Hospital Stay

## 2023-05-01 ENCOUNTER — Telehealth: Payer: Self-pay

## 2023-05-01 VITALS — BP 111/49 | HR 66 | Temp 97.8°F | Resp 13 | Wt 178.0 lb

## 2023-05-01 DIAGNOSIS — Z452 Encounter for adjustment and management of vascular access device: Secondary | ICD-10-CM | POA: Diagnosis not present

## 2023-05-01 DIAGNOSIS — Z95828 Presence of other vascular implants and grafts: Secondary | ICD-10-CM

## 2023-05-01 DIAGNOSIS — Z08 Encounter for follow-up examination after completed treatment for malignant neoplasm: Secondary | ICD-10-CM | POA: Diagnosis present

## 2023-05-01 DIAGNOSIS — Z79899 Other long term (current) drug therapy: Secondary | ICD-10-CM | POA: Diagnosis not present

## 2023-05-01 DIAGNOSIS — C8313 Mantle cell lymphoma, intra-abdominal lymph nodes: Secondary | ICD-10-CM

## 2023-05-01 DIAGNOSIS — Z8572 Personal history of non-Hodgkin lymphomas: Secondary | ICD-10-CM | POA: Insufficient documentation

## 2023-05-01 DIAGNOSIS — R109 Unspecified abdominal pain: Secondary | ICD-10-CM | POA: Diagnosis not present

## 2023-05-01 DIAGNOSIS — R112 Nausea with vomiting, unspecified: Secondary | ICD-10-CM | POA: Diagnosis not present

## 2023-05-01 LAB — CMP (CANCER CENTER ONLY)
ALT: 15 U/L (ref 0–44)
AST: 20 U/L (ref 15–41)
Albumin: 3.9 g/dL (ref 3.5–5.0)
Alkaline Phosphatase: 114 U/L (ref 38–126)
Anion gap: 9 (ref 5–15)
BUN: 16 mg/dL (ref 8–23)
CO2: 35 mmol/L — ABNORMAL HIGH (ref 22–32)
Calcium: 9.1 mg/dL (ref 8.9–10.3)
Chloride: 94 mmol/L — ABNORMAL LOW (ref 98–111)
Creatinine: 1.08 mg/dL — ABNORMAL HIGH (ref 0.44–1.00)
GFR, Estimated: 56 mL/min — ABNORMAL LOW (ref >60)
Glucose, Bld: 86 mg/dL (ref 70–99)
Potassium: 3.1 mmol/L — ABNORMAL LOW (ref 3.5–5.1)
Sodium: 138 mmol/L (ref 135–145)
Total Bilirubin: 0.4 mg/dL (ref 0.0–1.2)
Total Protein: 6.6 g/dL (ref 6.5–8.1)

## 2023-05-01 LAB — CBC WITH DIFFERENTIAL (CANCER CENTER ONLY)
Abs Immature Granulocytes: 0.08 10*3/uL — ABNORMAL HIGH (ref 0.00–0.07)
Basophils Absolute: 0 10*3/uL (ref 0.0–0.1)
Basophils Relative: 1 %
Eosinophils Absolute: 0 10*3/uL (ref 0.0–0.5)
Eosinophils Relative: 1 %
HCT: 36.3 % (ref 36.0–46.0)
Hemoglobin: 11.8 g/dL — ABNORMAL LOW (ref 12.0–15.0)
Immature Granulocytes: 2 %
Lymphocytes Relative: 12 %
Lymphs Abs: 0.4 10*3/uL — ABNORMAL LOW (ref 0.7–4.0)
MCH: 27.8 pg (ref 26.0–34.0)
MCHC: 32.5 g/dL (ref 30.0–36.0)
MCV: 85.4 fL (ref 80.0–100.0)
Monocytes Absolute: 0.4 10*3/uL (ref 0.1–1.0)
Monocytes Relative: 13 %
Neutro Abs: 2.4 10*3/uL (ref 1.7–7.7)
Neutrophils Relative %: 71 %
Platelet Count: 411 10*3/uL — ABNORMAL HIGH (ref 150–400)
RBC: 4.25 MIL/uL (ref 3.87–5.11)
RDW: 15.9 % — ABNORMAL HIGH (ref 11.5–15.5)
WBC Count: 3.3 10*3/uL — ABNORMAL LOW (ref 4.0–10.5)
nRBC: 0 % (ref 0.0–0.2)

## 2023-05-01 LAB — LACTATE DEHYDROGENASE: LDH: 178 U/L (ref 98–192)

## 2023-05-01 MED ORDER — SODIUM CHLORIDE 0.9% FLUSH
10.0000 mL | INTRAVENOUS | Status: DC | PRN
  Administered 2023-05-01: 10 mL

## 2023-05-01 MED ORDER — HEPARIN SOD (PORK) LOCK FLUSH 100 UNIT/ML IV SOLN
500.0000 [IU] | Freq: Once | INTRAVENOUS | Status: AC | PRN
Start: 2023-05-01 — End: 2023-05-01
  Administered 2023-05-01: 500 [IU]

## 2023-05-01 NOTE — Telephone Encounter (Signed)
 Confirmed appt for IVF's with patient for 05/05/2023 @ 3 pm.  Patient verbalized understanding and voiced thanks.

## 2023-05-01 NOTE — Telephone Encounter (Signed)
 Spoke with patient this morning in regards to appts today.  Informed patient that Dr. Marguerita Shih has to leave early and asked if patient could come in earlier.  Patient agrees to 1:15 port flush with lab and to see [name], PA/Dr. Marguerita Shih afterward.

## 2023-05-02 ENCOUNTER — Encounter: Payer: Self-pay | Admitting: Internal Medicine

## 2023-05-05 ENCOUNTER — Inpatient Hospital Stay

## 2023-05-05 ENCOUNTER — Inpatient Hospital Stay: Payer: Medicare Other

## 2023-05-05 VITALS — BP 90/50 | HR 70 | Resp 16

## 2023-05-05 DIAGNOSIS — R112 Nausea with vomiting, unspecified: Secondary | ICD-10-CM

## 2023-05-05 DIAGNOSIS — Z08 Encounter for follow-up examination after completed treatment for malignant neoplasm: Secondary | ICD-10-CM | POA: Diagnosis not present

## 2023-05-05 DIAGNOSIS — Z95828 Presence of other vascular implants and grafts: Secondary | ICD-10-CM

## 2023-05-05 MED ORDER — SODIUM CHLORIDE 0.9% FLUSH
10.0000 mL | INTRAVENOUS | Status: DC | PRN
  Administered 2023-05-05: 10 mL

## 2023-05-05 MED ORDER — SODIUM CHLORIDE 0.9 % IV SOLN: Freq: Once | INTRAVENOUS | Status: AC

## 2023-05-05 MED ORDER — HEPARIN SOD (PORK) LOCK FLUSH 100 UNIT/ML IV SOLN
500.0000 [IU] | Freq: Once | INTRAVENOUS | Status: AC | PRN
  Administered 2023-05-05: 500 [IU]

## 2023-05-05 NOTE — Patient Instructions (Signed)

## 2023-05-12 ENCOUNTER — Inpatient Hospital Stay: Payer: Medicare Other | Admitting: Internal Medicine

## 2023-05-19 ENCOUNTER — Other Ambulatory Visit (HOSPITAL_COMMUNITY): Payer: Self-pay

## 2023-05-19 NOTE — Progress Notes (Signed)
 Advanced Heart Failure Clinic Note   PCP: [name], MD PCP-Cardiologist: None  AHFC: Dr. Julane Ny   Reason for Visit:  F/u for Chronic Systolic Heart Failure  HPI:  Natalie Baker is a 68 y.o. female with a hx of palpitations thought to be PVCs, HTN, non hodgkins lymphoma treated w/ R-CHOP and systolic HF.   Admitted 6/21 with acute HF. ECHO  EF 20-25% w/ mod-severe MR.  RV moderately reduced. Developed VT and was started on amiodarone  and transferred to [facility] for [facility] which showed normal coronaries, elevated filling pressures w/ low CO c/w cardiogenic shock. CI was 1.7. She was started on milrinone , IV amiodarone  and IV Lasix  for diuresis.   Echo 9/21: EF 40-45%   On repeat echo 05/04/20 EF had normalized to 50-55% G2DD.    At oncology f/u 6/22,  repeat CT scan unfortunately showed evidence for disease progression with mild increase in the small bilateral axillary lymph nodes and moderate increase in the left supraclavicular as well as ileocolic mesenteric lymph nodes and left external iliac lymph nodes but mild increase in the periaortic retroperitoneal lymph nodes. She started treatment with acalabrutinib  100 mg p.o. twice daily on June 17 2020 and Dr. Marguerita Shih referred her to [facility] for bone marrow transplant evaluation. As part of w/u at [facility], an echocardiogram was obtained and showed drop in LVEF back down to 40% w/ echodensity vs thickening of the left ventricular apex concerning for possible LV thrombus. Subsequently, she was referred back to [facility] for further evaluation.    Had follow up 10/22, and endorsed stable NYHA II symptoms. Losartan  was increased and cMRI arranged to assess for LV thrombus.    cMRI 11/22 LVEF 39%, no LV thrombus noted.   Follow up 11/22 Not a candidate for stem cell transplant, but will trial CAR-T therapy soon.    Recently d/c from [facility] after CAR-T therapy, had cytokine storm, taken off all HF meds due to low BP. Echo 1/23 with EF 40-45%.     Admitted from HF clinic on 03/07/21 with acute on chronic systolic CHF with low-output. Bedside echo with EF < 20%. PICC line placed. Initial co-ox 34% and she was started on inotrope support with milrinone . Diuresed with IV lasix  then transitioned to po torsemide . She failed initial milrinone  wean then was eventually titrated off milrinone  with stable co-ox. Torsemide  decreased to 20 mg daily (starting 03/15) at discharge d/t AKI and low volume. Scr 1.24>>1.64 (baseline 1.0). She was started on GDMT. Had frequent PVCs and runs of NSVT which improved with addition of po amiodarone . Course also complicated by left brachial DVT for which she was started on Eliquis . Discharged on 3/13. D/c wt 172 lb.   Post hospital follow up doing well. Labs showed worsening SCr and elevated dig level. he was instructed to stop digoxin , hold torsemide  x 1 day then resume at every other day dosing. Repeat labs showed stable SCr and improved dig level. She remained off dig.   Per Dr. Bing Buff note, she remains on observation w/ plans to continue monthly blood work and f/u w/ him in 2 months.   Echo 3/23 EF 20-25%   Echo 06/29/21 EF 35-40% (read as 30-35%) severe MR   TEE 07/04/21 EF 20-25% severe MR moderate  Follow up 4/24, doing well. Volume ok on torsemide  20 mg daily and BP stable off midodrine .   Echo 5/24 showed EF 35%, RV mildly reduced, mild to moderate MR  Today she returns for HF follow up.  Overall feeling fine. She has SOB on exertion and benopnea. She has SOB walking up steps and walking fast down the hallway. She has some ankle swelling.  Denies palpitations, CP, dizziness, or PND/Orthopnea. Appetite ok. No fever or chills. Weight at home 188 pounds. Taking all medications, did not tolerate losartan .    Cardiac Studies: Echo 5/24: EF 35%, RV mildly reduced, mild to moderate MR  TEE 07/04/21: EF 20-25% severe MR moderate  Echo 06/29/21: EF 35-40% (read as 30-35%) severe MR   Echo 3/23: EF 20-25%, RV  mildly reduced. Small pericardial effusion, no tamponade    cMRI 11/22: EF 39%, no LV thrombus  Echo [facility] 10/04/20: EF 40%, grade I DD, RV ok, mild MR, mild LAE, small circumferential pericardial effusion, Echodensity vs thickening of the left ventricular apex, cannot rule out thrombus  Echo 4/22: EF 50-55% G2DD. RV normal   Echo 9/21: EF 40-45%, RV normal   Echo 6/21: EF 20-25%  moderately reduced RV function with severe MR/TR   Cath 6/18: with normal cors. Low output. Ao = 109/71 (81) LV =  99/23 RA =  15 RV =  43/18 PA =  35/14 (25) PCW = 22 Fick cardiac output/index = 3.3/1.7 PVR = < 1.0 WU Ao sat = 100% PA sat = 49%, 53%   - Venous duplex LUE, 3/23 Summary:  Right:  No evidence of thrombosis in the subclavian.     Left:  Findings consistent with partial age indeterminate deep vein thrombosis  involving the left brachial veins.  Review of systems complete and found to be negative unless listed in HPI.    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 CHOP and others   Obesity    BMI 34   Osteoporosis    most recent DEXA 02/2022   Pre-diabetes    Secondary nonischemic congestive cardiomyopathy (HCC)    Adriamycin  2020   Ventricular tachycardia (HCC)    Vitamin D  deficiency    Current Outpatient Medications  Medication Sig Dispense Refill   amiodarone  (PACERONE ) 200 MG tablet Take 0.5 tablets (100 mg total) by mouth daily. 45 tablet 3   Cholecalciferol  (VITAMIN D -3) 25 MCG (1000 UT) CAPS Take 1 capsule by mouth daily.     ELIQUIS  5 MG TABS tablet TAKE 1 TABLET BY MOUTH TWICE A DAY 60 tablet 6   estradiol  (VIVELLE -DOT) 0.05 MG/24HR patch Place 1  patch onto the skin 2 (two) times a week. Tuesdays & Fridays     FARXIGA  10 MG TABS tablet TAKE 1 TABLET BY MOUTH EVERY DAY 30 tablet 11   ivabradine  (CORLANOR ) 7.5 MG TABS tablet Take 1 tablet (7.5 mg total) by mouth 2 (two) times daily with a meal. NEEDS FOLLOW UP APPOINTMENT FOR MORE REFILLS 60 tablet 1   levothyroxine  (SYNTHROID ) 100 MCG tablet TAKE 1 TABLET BY MOUTH DAILY ON AN EMPTY STOMACH WITH ONLY WATER FOR 30 MINUTES & NO ANTACID/CALCIUM /MAGNESIUM  FOR 4 HOURS & AVOID BIOTIN 90 tablet 0   losartan  (COZAAR ) 25 MG tablet Take 0.5 tablets (12.5 mg total) by mouth daily. 45 tablet 3   ondansetron  (ZOFRAN -ODT) 4 MG disintegrating tablet Take 1  tablet (4 mg total) by mouth every 8 (eight) hours as needed for nausea or vomiting. 30 tablet 3   potassium chloride  SA (KLOR-CON  Natalie) 20 MEQ tablet Take 2 tablets (40 mEq total) by mouth daily. 30 tablet 6   pravastatin  (PRAVACHOL ) 20 MG tablet Take 1 tablet (20 mg total) by mouth daily. 90 tablet 0   prochlorperazine  (COMPAZINE ) 10 MG tablet TAKE 1 TABLET BY MOUTH EVERY 6 HOURS AS NEEDED FOR NAUSEA OR VOMITING. 30 tablet 0   spironolactone  (ALDACTONE ) 25 MG tablet TAKE 1 TABLET BY MOUTH EVERY DAY 90 tablet 3   sulfamethoxazole -trimethoprim  (BACTRIM  DS) 800-160 MG tablet Take 1 tablet by mouth See admin instructions. Take 1 tablet twice daily on Saturdays and Sundays ONLY     torsemide  (DEMADEX ) 20 MG tablet Take 2 tablets (40 mg total) by mouth daily. 60 tablet 11   valACYclovir  (VALTREX ) 500 MG tablet Take 500 mg by mouth 2 (two) times daily.     No current facility-administered medications for this visit.   Allergies  Allergen Reactions   Adriamycin  [Doxorubicin ] Other (See Comments)    Acute heart failure (chemo from Jan - May 2021)   Ppd [Tuberculin Purified Protein Derivative] Swelling    Positive reaction.  Negative Chest Xray 06/16/12   Social History   Socioeconomic History   Marital status: Married    Spouse name: Not on file   Number  of children: 1   Years of education: Not on file   Highest education level: Not on file  Occupational History   Occupation: Marine scientist  Tobacco Use   Smoking status: Former    Current packs/day: 0.00    Types: Cigarettes    Start date: 01/07/1981    Quit date: 01/08/1996    Years since quitting: 27.3   Smokeless tobacco: Never   Tobacco comments:    quit 1998 but is exposed to 2nd hand smoke  Vaping Use   Vaping status: Never Used  Substance and Sexual Activity   Alcohol use: No   Drug use: No   Sexual activity: Yes    Partners: Male    Birth control/protection: Post-menopausal  Other Topics Concern   Not on file  Social History Narrative   Married, one daughter,    Educ: HS   Occup:  employed as an Conservator, museum/gallery.   No tob or alc   Social Drivers of Corporate investment banker Strain: Low Risk  (03/22/2021)   Overall Financial Resource Strain (CARDIA)    Difficulty of Paying Living Expenses: Not very hard  Food Insecurity: No Food Insecurity (03/22/2021)   Hunger Vital Sign    Worried About Running Out of Food in the Last Year: Never true    Ran Out of Food in the Last Year: Never true  Transportation Needs: No Transportation Needs (03/22/2021)   PRAPARE - Administrator, Civil Service (Medical): No    Lack of Transportation (Non-Medical): No  Physical Activity: Inactive (08/13/2018)   Exercise Vital Sign    Days of Exercise per Week: 0 days    Minutes of Exercise per Session: 0 min  Stress: No Stress Concern Present (08/13/2018)   [employer] of Occupational Health - Occupational Stress Questionnaire    Feeling of Stress : Only a little  Social Connections: Moderately Integrated (07/02/2019)   Social Connection and Isolation Panel [NHANES]    Frequency of Communication with Friends and Family: More than three times a week    Frequency of  Social Gatherings with Friends and Family: Once a week    Attends Religious Services: More than 4 times per  year    Active Member of [employer] or Organizations: No    Attends Banker Meetings: Never    Marital Status: Married  Catering manager Violence: Not on file   Family History  Problem Relation Age of Onset   Heart disease Mother 63       chf   Diabetes Mother    Thyroid  disease Mother    Cancer Mother 89       female/ skin   Hypertension Mother    Liver disease Mother    Hypertension Father    Alcohol abuse Father    Heart disease Father    Breast cancer Sister 17   Diabetes Sister    Hypothyroidism Sister    Hashimoto's thyroiditis Sister    Hyperlipidemia Brother    Hypertension Brother        x 2   Diabetes Brother    Hypothyroidism Brother    Prostate cancer Brother 67   Liver disease Maternal Grandmother    Diabetes Paternal Grandmother    Breast cancer Maternal Aunt    Hyperlipidemia Maternal Aunt    Heart disease Maternal Aunt    Bone cancer Maternal Aunt        metastatic   Breast cancer Niece 41   Colon cancer Neg Hx    Colon polyps Neg Hx    Esophageal cancer Neg Hx    Rectal cancer Neg Hx    Stomach cancer Neg Hx    There were no vitals taken for this visit.  Wt Readings from Last 3 Encounters:  05/01/23 80.7 kg (178 lb)  04/07/23 81.6 kg (180 lb)  03/27/23 81.1 kg (178 lb 11.2 oz)   PHYSICAL EXAM: General:  NAD. No resp difficulty, walked into clinic HEENT: Normal Neck: Supple. No JVD. Carotids 2+ bilat; no bruits. No lymphadenopathy or thryomegaly appreciated. Cor: PMI nondisplaced. Regular rate & rhythm. No rubs, gallops or murmurs. Lungs: Clear Abdomen: Soft, nontender, nondistended. No hepatosplenomegaly. No bruits or masses. Good bowel sounds. Extremities: No cyanosis, clubbing, rash, edema Neuro: Alert & oriented x 3, cranial nerves grossly intact. Moves all 4 extremities w/o difficulty. Affect pleasant.  ECG (personally reviewed with Dr. Julane Ny): NSR 1AVB, QTc 440 msec  ReDs: 34%  ASSESSMENT & PLAN: 1. Chronic Systolic HF   - Echo 1/21 EF 55-60%. RV normal - Echo 6/21 EF 20-25%  moderately reduced RV function with severe MR/TR - Cath 06/25/19 no CAD. - Drop in EF felt most likely from R-CHOP - Echo 05/04/20 w/ EF 50-55% G2DD. RV normal - Echo 9/22 at [facility] EF back down to 40% w/ ? LV thrombus - cMRI 11/22 LVEF 39%, no LV clot - Echo 1/23 [facility]): EF 40-45% - Echo 3/23: EF 20-25%, RV mildly reduced, RVSP 44 mmHg - Direct admit from clinic 03/07/21  Repeat echo, EF 20%.  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 Required milrinone  for low-output.  - Echo 06/29/21 EF improved 35-40% (read as 30-35%) with new severe MR - TEE 07/04/21 EF 20-25% severe MR moderate - Echo (5/24): EF 35%, RV mildly reduced, mild to moderate MR - Stable NYHA II-IIb. Volume stable.  - Retrial losartan  12.5 mg at bedtime. - Continue torsemide  20 mg daily. - Continue Farxiga  10 mg daily - Continue spiro 25 mg daily.  - Continue ivabradine  7.5 mg bid, HR 71 today - Off digoxin  due to elevated level  -  Off midodrine  with improved BP - Labs today, repeat BMET in 10 days.  2. H/o VT + PVCs - No palpitations - NSR on ECG today - Continue amio 100 mg daily - Recent amio labs ok  3. Severe MR  - likely functional - TEE 07/04/21 EF 20-25% severe MR moderate - Mild to moderate on most recent echo   4. Stage IV non-Hodgkin's lymphoma, mantle cell lymphoma   - s/p R-CHOP w/ Neulasta . - Evidence of disease progression on f/u Chest CT 6/22. - s/p CAR-T therapy. - Followed at [facility]  5. Partial Lt Brachial DVT (provoked) - Diagnosed 03/13/21 - Continue Eliquis . Denies abnormal bleeding    6. HL - LDL 155  - Felt "bad" on Crestor .    7. HRT - ok to continue from cardiology standpoint as no significant drug interaction and previous DVT was provoked - This was d/w HF pharmD   Follow up in 4 months with Dr. Bensimhon  Natalie Baker Natalie Natalie Gilkey, FNP 05/19/23

## 2023-05-21 ENCOUNTER — Ambulatory Visit (HOSPITAL_COMMUNITY)
Admission: RE | Admit: 2023-05-21 | Discharge: 2023-05-21 | Disposition: A | Discharge status: Home / Self Care | Source: Ambulatory Visit | Attending: Family Medicine | Admitting: Family Medicine

## 2023-05-21 ENCOUNTER — Encounter (HOSPITAL_COMMUNITY): Payer: Self-pay

## 2023-05-21 ENCOUNTER — Ambulatory Visit (HOSPITAL_COMMUNITY): Payer: Self-pay | Admitting: Family Medicine

## 2023-05-21 VITALS — BP 104/62 | HR 81 | Wt 177.2 lb

## 2023-05-21 DIAGNOSIS — Z5181 Encounter for therapeutic drug level monitoring: Secondary | ICD-10-CM | POA: Diagnosis not present

## 2023-05-21 DIAGNOSIS — I493 Ventricular premature depolarization: Secondary | ICD-10-CM | POA: Insufficient documentation

## 2023-05-21 DIAGNOSIS — Z87891 Personal history of nicotine dependence: Secondary | ICD-10-CM | POA: Diagnosis not present

## 2023-05-21 DIAGNOSIS — Z86718 Personal history of other venous thrombosis and embolism: Secondary | ICD-10-CM | POA: Insufficient documentation

## 2023-05-21 DIAGNOSIS — Z79899 Other long term (current) drug therapy: Secondary | ICD-10-CM | POA: Diagnosis not present

## 2023-05-21 DIAGNOSIS — I472 Ventricular tachycardia, unspecified: Secondary | ICD-10-CM | POA: Insufficient documentation

## 2023-05-21 DIAGNOSIS — I340 Nonrheumatic mitral (valve) insufficiency: Secondary | ICD-10-CM | POA: Diagnosis not present

## 2023-05-21 DIAGNOSIS — I110 Hypertensive heart disease with heart failure: Secondary | ICD-10-CM | POA: Diagnosis not present

## 2023-05-21 DIAGNOSIS — Z7984 Long term (current) use of oral hypoglycemic drugs: Secondary | ICD-10-CM | POA: Diagnosis not present

## 2023-05-21 DIAGNOSIS — C8310 Mantle cell lymphoma, unspecified site: Secondary | ICD-10-CM | POA: Diagnosis not present

## 2023-05-21 DIAGNOSIS — E782 Mixed hyperlipidemia: Secondary | ICD-10-CM

## 2023-05-21 DIAGNOSIS — Z7901 Long term (current) use of anticoagulants: Secondary | ICD-10-CM | POA: Insufficient documentation

## 2023-05-21 DIAGNOSIS — C8316 Mantle cell lymphoma, intrapelvic lymph nodes: Secondary | ICD-10-CM

## 2023-05-21 DIAGNOSIS — I5022 Chronic systolic (congestive) heart failure: Secondary | ICD-10-CM | POA: Insufficient documentation

## 2023-05-21 LAB — BASIC METABOLIC PANEL WITH GFR
Anion gap: 12 (ref 5–15)
BUN: 13 mg/dL (ref 8–23)
CO2: 29 mmol/L (ref 22–32)
Calcium: 9.3 mg/dL (ref 8.9–10.3)
Chloride: 96 mmol/L — ABNORMAL LOW (ref 98–111)
Creatinine, Ser: 1.22 mg/dL — ABNORMAL HIGH (ref 0.44–1.00)
GFR, Estimated: 49 mL/min — ABNORMAL LOW (ref >60)
Glucose, Bld: 109 mg/dL — ABNORMAL HIGH (ref 70–99)
Potassium: 3.7 mmol/L (ref 3.5–5.1)
Sodium: 137 mmol/L (ref 135–145)

## 2023-05-21 MED ORDER — POTASSIUM CHLORIDE ER 10 MEQ PO TBCR: 40.0000 meq | EXTENDED_RELEASE_TABLET | Freq: Every day | ORAL | 11 refills | Status: DC

## 2023-05-21 NOTE — Patient Instructions (Addendum)
 Thank you for coming in today  If you had labs drawn today, any labs that are abnormal the clinic will call you No news is good news  Chest xray done today  You were ordered Pulmonary function test and will be called to schedule this test  You have been referred to cardiac rehab they will contact you for further details    Medications: Potassium tablets changed to 40 meq 4 tablets daily   Follow up appointments:  Your physician recommends that you schedule a follow-up appointment in:  3 months with With Dr. Julane Ny Please call our office to schedule the follow-up appointment in June for August 2025  Your physician has requested that you have an echocardiogram. Echocardiography is a painless test that uses sound waves to create images of your heart. It provides your doctor with information about the size and shape of your heart and how well your heart's chambers and valves are working. This procedure takes approximately one hour. There are no restrictions for this procedure.      Do the following things EVERYDAY: Weigh yourself in the morning before breakfast. Write it down and keep it in a log. Take your medicines as prescribed Eat low salt foods--Limit salt (sodium) to 2000 mg per day.  Stay as active as you can everyday Limit all fluids for the day to less than 2 liters   At the [facility], you and your health needs are our priority. As part of our continuing mission to provide you with exceptional heart care, we have created designated Provider Care Teams. These Care Teams include your primary Cardiologist (physician) and Advanced Practice Providers (APPs- Physician Assistants and Nurse Practitioners) who all work together to provide you with the care you need, when you need it.   You may see any of the following providers on your designated Care Team at your next follow up: Dr Jules Oar Dr Peder Bourdon Dr. Mimi Alt,  NP Ruddy Corral, [place] [facility] [place], [place] Dennise Fitz, NP Luster Salters, PharmD   Please be sure to bring in all your medications bottles to every appointment.    Thank you for choosing [facility]  If you have any questions or concerns before your next appointment please send us  a message through [place] or call our office at [phone].    TO LEAVE A MESSAGE FOR THE NURSE SELECT OPTION 2, PLEASE LEAVE A MESSAGE INCLUDING: YOUR NAME DATE OF BIRTH CALL BACK NUMBER REASON FOR CALL**this is important as we prioritize the call backs  YOU WILL RECEIVE A CALL BACK THE SAME DAY AS LONG AS YOU CALL BEFORE 4:00 PM

## 2023-05-21 NOTE — Progress Notes (Signed)
 ReDS Vest / Clip - 05/21/23 1400       ReDS Vest / Clip   Station Marker A    Ruler Value 30    ReDS Value Range Low volume    ReDS Actual Value 30

## 2023-05-27 ENCOUNTER — Telehealth (HOSPITAL_COMMUNITY): Payer: Self-pay

## 2023-05-27 NOTE — Telephone Encounter (Signed)
 Pt insurance is active and benefits verified through Medicare A/B. Co-pay $0.00, DED $257.00/$257.00 met, out of pocket $0.00/$0.00 met, co-insurance 20%. No pre-authorization required. Passport, 05/27/23 @ 12:22PM, REF#[id number]   How many CR sessions are covered? (36 visits for TCR, 72 visits for ICR)72 Is this a lifetime maximum or an annual maximum? Lifetime Has the member used any of these services to date? No Is there a time limit (weeks/months) on start of program and/or program completion? No   2ndary insurance is active and benefits verified through [employer] [place]. Co-pay $0.00, DED $0.00/$0.00 met, out of pocket $0.00/$0.00 met, co-insurance 0%. No pre-authorization required. Passport, 05/27/23 @ 12:25PM, REF#[id number]

## 2023-05-27 NOTE — Telephone Encounter (Signed)
 Called patient to see if she was interested in participating in the Cardiac Rehab Program. Patient will come in for orientation on 05/28/23 @ 1:15PM and will attend the 12:30PM exercise class.   Pensions consultant.

## 2023-05-28 ENCOUNTER — Encounter (HOSPITAL_COMMUNITY)
Admission: RE | Admit: 2023-05-28 | Discharge: 2023-05-28 | Disposition: A | Discharge status: Home / Self Care | Source: Ambulatory Visit | Attending: Internal Medicine | Admitting: Internal Medicine

## 2023-05-28 ENCOUNTER — Ambulatory Visit

## 2023-05-28 VITALS — BP 112/68 | HR 64 | Ht 62.0 in | Wt 180.1 lb

## 2023-05-28 DIAGNOSIS — I5022 Chronic systolic (congestive) heart failure: Secondary | ICD-10-CM | POA: Insufficient documentation

## 2023-05-28 NOTE — Progress Notes (Signed)
 Cardiac Individual Treatment Plan  Patient Details  Name: Natalie Baker MRN: [id number] Date of Birth: [birth year] Referring Provider:   Flowsheet Row INTENSIVE CARDIAC REHAB ORIENT from 05/28/2023 in [facility]  Referring Provider Jules Oar, MD       Initial Encounter Date:  Flowsheet Row INTENSIVE CARDIAC REHAB ORIENT from 05/28/2023 in [facility]  Date 05/28/23       Visit Diagnosis: Heart failure, chronic systolic (HCC)  Patient's Home Medications on Admission:  Current Outpatient Medications:    amiodarone  (PACERONE ) 200 MG tablet, Take 0.5 tablets (100 mg total) by mouth daily., Disp: 45 tablet, Rfl: 3   Cholecalciferol  (VITAMIN D -3) 25 MCG (1000 UT) CAPS, Take 1 capsule by mouth daily., Disp: , Rfl:    ELIQUIS  5 MG TABS tablet, TAKE 1 TABLET BY MOUTH TWICE A DAY, Disp: 60 tablet, Rfl: 6   estradiol  (VIVELLE -DOT) 0.05 MG/24HR patch, Place 1 patch onto the skin 2 (two) times a week. Tuesdays & Fridays, Disp: , Rfl:    FARXIGA  10 MG TABS tablet, TAKE 1 TABLET BY MOUTH EVERY DAY, Disp: 30 tablet, Rfl: 11   ivabradine  (CORLANOR ) 7.5 MG TABS tablet, Take 1 tablet (7.5 mg total) by mouth 2 (two) times daily with a meal. NEEDS FOLLOW UP APPOINTMENT FOR MORE REFILLS, Disp: 60 tablet, Rfl: 1   levothyroxine  (SYNTHROID ) 100 MCG tablet, TAKE 1 TABLET BY MOUTH DAILY ON AN EMPTY STOMACH WITH ONLY WATER FOR 30 MINUTES & NO ANTACID/CALCIUM /MAGNESIUM  FOR 4 HOURS & AVOID BIOTIN, Disp: 90 tablet, Rfl: 0   losartan  (COZAAR ) 25 MG tablet, Take 0.5 tablets (12.5 mg total) by mouth daily., Disp: 45 tablet, Rfl: 3   ondansetron  (ZOFRAN -ODT) 4 MG disintegrating tablet, Take 1 tablet (4 mg total) by mouth every 8 (eight) hours as needed for nausea or vomiting., Disp: 30 tablet, Rfl: 3   potassium chloride  (KLOR-CON ) 10 MEQ tablet, Take 4 tablets (40 mEq total) by mouth daily.,  Disp: 120 tablet, Rfl: 11   pravastatin  (PRAVACHOL ) 20 MG tablet, Take 1 tablet (20 mg total) by mouth daily., Disp: 90 tablet, Rfl: 0   prochlorperazine  (COMPAZINE ) 10 MG tablet, TAKE 1 TABLET BY MOUTH EVERY 6 HOURS AS NEEDED FOR NAUSEA OR VOMITING., Disp: 30 tablet, Rfl: 0   spironolactone  (ALDACTONE ) 25 MG tablet, TAKE 1 TABLET BY MOUTH EVERY DAY, Disp: 90 tablet, Rfl: 3   torsemide  (DEMADEX ) 20 MG tablet, Take 2 tablets (40 mg total) by mouth daily. (Patient taking differently: Take 20 mg by mouth daily.), Disp: 60 tablet, Rfl: 11   potassium chloride  SA (KLOR-CON  M) 20 MEQ tablet, Take 2 tablets (40 mEq total) by mouth daily. (Patient not taking: Reported on 05/28/2023), Disp: 30 tablet, Rfl: 6   sulfamethoxazole -trimethoprim  (BACTRIM  DS) 800-160 MG tablet, Take 1 tablet by mouth See admin instructions. Take 1 tablet twice daily on Saturdays and Sundays ONLY (Patient not taking: Reported on 05/28/2023), Disp: , Rfl:    valACYclovir  (VALTREX ) 500 MG tablet, Take 500 mg by mouth 2 (two) times daily. 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 [facility] and others   Obesity    BMI 34   Osteoporosis    most recent DEXA 02/2022   Pre-diabetes    Secondary nonischemic congestive cardiomyopathy (HCC)    Adriamycin  2020   Ventricular tachycardia (HCC)    Vitamin D  deficiency     Tobacco Use: Social History   Tobacco Use  Smoking Status Former   Current packs/day: 0.00   Types: Cigarettes   Start date: 01/07/1981   Quit date: 01/08/1996   Years since quitting: 27.4  Smokeless Tobacco Never   Tobacco Comments   quit 1998 but is exposed to 2nd hand smoke    Labs: Review Flowsheet  More data exists      Latest Ref Rng & Units 03/17/2021 03/18/2021 03/19/2021 09/05/2021 12/13/2022  Labs for ITP Cardiac and Pulmonary Rehab  Cholestrol <200 mg/dL - - - 630  160   LDL (calc) mg/dL (calc) - - - 109  323   HDL-C > OR = 50 mg/dL - - - 59  67   Trlycerides <150 mg/dL - - - 557  322   Hemoglobin A1c <5.7 % of total Hgb - - - 5.5  5.8   O2 Saturation % 71.3  59.6  60  - -    Capillary Blood Glucose: Lab Results  Component Value Date   GLUCAP 100 (H) 10/31/2021   GLUCAP 85 09/25/2020   GLUCAP 101 (H) 09/21/2020   GLUCAP 117 (H) 06/26/2019   GLUCAP 104 (H) 06/10/2019     Exercise Target Goals: Exercise Program Goal: Individual exercise prescription set using results from initial 6 min walk test and THRR while considering  patient's activity barriers and safety.   Exercise Prescription Goal: Initial exercise prescription builds to 30-45 minutes a day of aerobic activity, 2-3 days per week.  Home exercise guidelines will be given to patient during program as part of exercise prescription that the participant will acknowledge.  Activity Barriers & Risk Stratification:  Activity Barriers & Cardiac Risk Stratification - 05/28/23 1409       Activity Barriers & Cardiac Risk Stratification   Activity Barriers Deconditioning;Decreased Ventricular Function;Balance Concerns    Cardiac Risk Stratification High   <5 METs on            6 Minute Walk:  6 Minute Walk     Row Name 05/28/23 1530         6 Minute Walk   Phase Initial     Distance 1385 feet     Walk Time 6 minutes     # of Rest Breaks 0     MPH 2.62     METS 2.93     RPE 8     Perceived Dyspnea  0     VO2 Peak 10.26     Symptoms No     Resting HR 64 bpm     Resting BP 112/68     Resting Oxygen Saturation  98 %     Exercise Oxygen Saturation  during 6 min walk 98 %     Max Ex. HR 121 bpm     Max  Ex. BP 118/62     2 Minute Post BP 116/70              Oxygen Initial Assessment:   Oxygen Re-Evaluation:   Oxygen Discharge (Final Oxygen Re-Evaluation):   Initial Exercise Prescription:  Initial  Exercise Prescription - 05/28/23 1500       Date of Initial Exercise RX and Referring Provider   Date 05/28/23    Referring Provider Jules Oar, MD    Expected Discharge Date 08/20/23      Recumbant Bike   Level 1    RPM 55    Watts 20    Minutes 15    METs 2      Track   Laps 15    Minutes 15    METs 2      Prescription Details   Frequency (times per week) 3    Duration Progress to 30 minutes of continuous aerobic without signs/symptoms of physical distress      Intensity   THRR 40-80% of Max Heartrate 61-122    Ratings of Perceived Exertion 11-13    Perceived Dyspnea 0-4      Progression   Progression Continue progressive overload as per policy without signs/symptoms or physical distress.      Resistance Training   Training Prescription Yes    Weight 2    Reps 10-15             Perform Capillary Blood Glucose checks as needed.  Exercise Prescription Changes:   Exercise Comments:   Exercise Goals and Review:   Exercise Goals     Row Name 05/28/23 1410             Exercise Goals   Increase Physical Activity Yes       Intervention Provide advice, education, support and counseling about physical activity/exercise needs.;Develop an individualized exercise prescription for aerobic and resistive training based on initial evaluation findings, risk stratification, comorbidities and participant's personal goals.       Expected Outcomes Short Term: Attend rehab on a regular basis to increase amount of physical activity.;Long Term: Exercising regularly at least 3-5 days a week.;Long Term: Add in home exercise to make exercise part of routine and to increase amount of physical activity.       Increase Strength and Stamina Yes       Intervention  Provide advice, education, support and counseling about physical activity/exercise needs.;Develop an individualized exercise prescription for aerobic and resistive training based on initial evaluation findings, risk stratification, comorbidities and participant's personal goals.       Expected Outcomes Short Term: Increase workloads from initial exercise prescription for resistance, speed, and METs.;Short Term: Perform resistance training exercises routinely during rehab and add in resistance training at home;Long Term: Improve cardiorespiratory fitness, muscular endurance and strength as measured by increased METs and functional capacity ( )       Able to understand and use rate of perceived exertion (RPE) scale Yes       Intervention Provide education and explanation on how to use RPE scale       Expected Outcomes Short Term: Able to use RPE daily in rehab to express subjective intensity level;Long Term:  Able to use RPE to guide intensity level when exercising independently       Knowledge and understanding of Target Heart Rate Range (THRR) Yes       Intervention Provide education and explanation of THRR including how the numbers were predicted and where they are located for reference       Expected Outcomes Short Term: Able to state/look up THRR;Short Term: Able to use daily as guideline for intensity in rehab;Long Term: Able to use THRR to govern intensity when exercising independently       Understanding  of Exercise Prescription Yes       Intervention Provide education, explanation, and written materials on patient's individual exercise prescription       Expected Outcomes Short Term: Able to explain program exercise prescription;Long Term: Able to explain home exercise prescription to exercise independently                Exercise Goals Re-Evaluation :   Discharge Exercise Prescription (Final Exercise Prescription Changes):   Nutrition:  Target Goals: Understanding of nutrition  guidelines, daily intake of sodium 1500mg , cholesterol 200mg , calories 30% from fat and 7% or less from saturated fats, daily to have 5 or more servings of fruits and vegetables.  Biometrics:  Pre Biometrics - 05/28/23 1404       Pre Biometrics   Waist Circumference 44 inches    Hip Circumference 47 inches    Waist to Hip Ratio 0.94 %    Triceps Skinfold 37 mm    % Body Fat 46.9 %    Grip Strength 10 kg    Flexibility 11 in    Single Leg Stand 5.73 seconds              Nutrition Therapy Plan and Nutrition Goals:   Nutrition Assessments:  MEDIFICTS Score Key: >=70 Need to make dietary changes  40-70 Heart Healthy Diet <= 40 Therapeutic Level Cholesterol Diet    Picture Your Plate Scores: <16 Unhealthy dietary pattern with much room for improvement. 41-50 Dietary pattern unlikely to meet recommendations for good health and room for improvement. 51-60 More healthful dietary pattern, with some room for improvement.  >60 Healthy dietary pattern, although there may be some specific behaviors that could be improved.    Nutrition Goals Re-Evaluation:   Nutrition Goals Re-Evaluation:   Nutrition Goals Discharge (Final Nutrition Goals Re-Evaluation):   Psychosocial: Target Goals: Acknowledge presence or absence of significant depression and/or stress, maximize coping skills, provide positive support system. Participant is able to verbalize types and ability to use techniques and skills needed for reducing stress and depression.  Initial Review & Psychosocial Screening:  Initial Psych Review & Screening - 05/28/23 1410       Initial Review   Current issues with None Identified      Family Dynamics   Good Support System? Yes   husband     Barriers   Psychosocial barriers to participate in program There are no identifiable barriers or psychosocial needs.      Screening Interventions   Interventions Encouraged to exercise;Provide feedback about the scores to  participant    Expected Outcomes Short Term goal: Identification and review with participant of any Quality of Life or Depression concerns found by scoring the questionnaire.;Long Term goal: The participant improves quality of Life and PHQ9 Scores as seen by post scores and/or verbalization of changes             Quality of Life Scores:  Quality of Life - 05/28/23 1533       Quality of Life   Select Quality of Life      Quality of Life Scores   Health/Function Pre 23.14 %    Socioeconomic Pre 30 %    Psych/Spiritual Pre 28.14 %    Family Pre 30 %    GLOBAL Pre 26.48 %            Scores of 19 and below usually indicate a poorer quality of life in these areas.  A difference of  2-3 points is a clinically  meaningful difference.  A difference of 2-3 points in the total score of the Quality of Life Index has been associated with significant improvement in overall quality of life, self-image, physical symptoms, and general health in studies assessing change in quality of life.  PHQ-9: Review Flowsheet  More data exists      05/28/2023 04/07/2023 01/09/2023 12/12/2020 08/13/2018  Depression screen PHQ 2/9  Decreased Interest 0 0 0 0 0  Down, Depressed, Hopeless 0 0 0 1 0  PHQ - 2 Score 0 0 0 1 0  Altered sleeping 1 1 - - -  Tired, decreased energy 1 3 - - -  Change in appetite 0 2 - - -  Feeling bad or failure about yourself  0 0 - - -  Trouble concentrating 0 0 - - -  Moving slowly or fidgety/restless 0 1 - - -  Suicidal thoughts 0 0 - - -  PHQ-9 Score 2 7 - - -  Difficult doing work/chores Not difficult at all Somewhat difficult - - -   Interpretation of Total Score  Total Score Depression Severity:  1-4 = Minimal depression, 5-9 = Mild depression, 10-14 = Moderate depression, 15-19 = Moderately severe depression, 20-27 = Severe depression   Psychosocial Evaluation and Intervention:   Psychosocial Re-Evaluation:   Psychosocial Discharge (Final Psychosocial  Re-Evaluation):   Vocational Rehabilitation: Provide vocational rehab assistance to qualifying candidates.   Vocational Rehab Evaluation & Intervention:  Vocational Rehab - 05/28/23 1410       Initial Vocational Rehab Evaluation & Intervention   Assessment shows need for Vocational Rehabilitation No   retired            Education: Education Goals: Education classes will be provided on a weekly basis, covering required topics. Participant will state understanding/return demonstration of topics presented.     Core Videos: Exercise    Move It!  Clinical staff conducted group or individual video education with verbal and written material and guidebook.  Patient learns the recommended Pritikin exercise program. Exercise with the goal of living a long, healthy life. Some of the health benefits of exercise include controlled diabetes, healthier blood pressure levels, improved cholesterol levels, improved heart and lung capacity, improved sleep, and better body composition. Everyone should speak with their doctor before starting or changing an exercise routine.  Biomechanical Limitations Clinical staff conducted group or individual video education with verbal and written material and guidebook.  Patient learns how biomechanical limitations can impact exercise and how we can mitigate and possibly overcome limitations to have an impactful and balanced exercise routine.  Body Composition Clinical staff conducted group or individual video education with verbal and written material and guidebook.  Patient learns that body composition (ratio of muscle mass to fat mass) is a key component to assessing overall fitness, rather than body weight alone. Increased fat mass, especially visceral belly fat, can put us  at increased risk for metabolic syndrome, type 2 diabetes, heart disease, and even death. It is recommended to combine diet and exercise (cardiovascular and resistance training) to improve  your body composition. Seek guidance from your physician and exercise physiologist before implementing an exercise routine.  Exercise Action Plan Clinical staff conducted group or individual video education with verbal and written material and guidebook.  Patient learns the recommended strategies to achieve and enjoy long-term exercise adherence, including variety, self-motivation, self-efficacy, and positive decision making. Benefits of exercise include fitness, good health, weight management, more energy, better sleep, less stress, and overall well-being.  Medical   Heart Disease Risk Reduction Clinical staff conducted group or individual video education with verbal and written material and guidebook.  Patient learns our heart is our most vital organ as it circulates oxygen, nutrients, white blood cells, and hormones throughout the entire body, and carries waste away. Data supports a plant-based eating plan like the Pritikin Program for its effectiveness in slowing progression of and reversing heart disease. The video provides a number of recommendations to address heart disease.   Metabolic Syndrome and Belly Fat  Clinical staff conducted group or individual video education with verbal and written material and guidebook.  Patient learns what metabolic syndrome is, how it leads to heart disease, and how one can reverse it and keep it from coming back. You have metabolic syndrome if you have 3 of the following 5 criteria: abdominal obesity, high blood pressure, high triglycerides, low HDL cholesterol, and high blood sugar.  Hypertension and Heart Disease Clinical staff conducted group or individual video education with verbal and written material and guidebook.  Patient learns that high blood pressure, or hypertension, is very common in the [place] . Hypertension is largely due to excessive salt intake, but other important risk factors include being overweight, physical inactivity, drinking  too much alcohol, smoking, and not eating enough potassium from fruits and vegetables. High blood pressure is a leading risk factor for heart attack, stroke, congestive heart failure, dementia, kidney failure, and premature death. Long-term effects of excessive salt intake include stiffening of the arteries and thickening of heart muscle and organ damage. Recommendations include ways to reduce hypertension and the risk of heart disease.  Diseases of Our Time - Focusing on Diabetes Clinical staff conducted group or individual video education with verbal and written material and guidebook.  Patient learns why the best way to stop diseases of our time is prevention, through food and other lifestyle changes. Medicine (such as prescription pills and surgeries) is often only a Band-Aid on the problem, not a long-term solution. Most common diseases of our time include obesity, type 2 diabetes, hypertension, heart disease, and cancer. The Pritikin Program is recommended and has been proven to help reduce, reverse, and/or prevent the damaging effects of metabolic syndrome.  Nutrition   Overview of the Pritikin Eating Plan  Clinical staff conducted group or individual video education with verbal and written material and guidebook.  Patient learns about the Pritikin Eating Plan for disease risk reduction. The Pritikin Eating Plan emphasizes a wide variety of unrefined, minimally-processed carbohydrates, like fruits, vegetables, whole grains, and legumes. Go, Caution, and Stop food choices are explained. Plant-based and lean animal proteins are emphasized. Rationale provided for low sodium intake for blood pressure control, low added sugars for blood sugar stabilization, and low added fats and oils for coronary artery disease risk reduction and weight management.  Calorie Density  Clinical staff conducted group or individual video education with verbal and written material and guidebook.  Patient learns about  calorie density and how it impacts the Pritikin Eating Plan. Knowing the characteristics of the food you choose will help you decide whether those foods will lead to weight gain or weight loss, and whether you want to consume more or less of them. Weight loss is usually a side effect of the Pritikin Eating Plan because of its focus on low calorie-dense foods.  Label Reading  Clinical staff conducted group or individual video education with verbal and written material and guidebook.  Patient learns about the Pritikin recommended label reading guidelines  and corresponding recommendations regarding calorie density, added sugars, sodium content, and whole grains.  Dining Out - Part 1  Clinical staff conducted group or individual video education with verbal and written material and guidebook.  Patient learns that restaurant meals can be sabotaging because they can be so high in calories, fat, sodium, and/or sugar. Patient learns recommended strategies on how to positively address this and avoid unhealthy pitfalls.  Facts on Fats  Clinical staff conducted group or individual video education with verbal and written material and guidebook.  Patient learns that lifestyle modifications can be just as effective, if not more so, as many medications for lowering your risk of heart disease. A Pritikin lifestyle can help to reduce your risk of inflammation and atherosclerosis (cholesterol build-up, or plaque, in the artery walls). Lifestyle interventions such as dietary choices and physical activity address the cause of atherosclerosis. A review of the types of fats and their impact on blood cholesterol levels, along with dietary recommendations to reduce fat intake is also included.  Nutrition Action Plan  Clinical staff conducted group or individual video education with verbal and written material and guidebook.  Patient learns how to incorporate Pritikin recommendations into their lifestyle. Recommendations  include planning and keeping personal health goals in mind as an important part of their success.  Healthy Mind-Set    Healthy Minds, Bodies, Hearts  Clinical staff conducted group or individual video education with verbal and written material and guidebook.  Patient learns how to identify when they are stressed. Video will discuss the impact of that stress, as well as the many benefits of stress management. Patient will also be introduced to stress management techniques. The way we think, act, and feel has an impact on our hearts.  How Our Thoughts Can Heal Our Hearts  Clinical staff conducted group or individual video education with verbal and written material and guidebook.  Patient learns that negative thoughts can cause depression and anxiety. This can result in negative lifestyle behavior and serious health problems. Cognitive behavioral therapy is an effective method to help control our thoughts in order to change and improve our emotional outlook.  Additional Videos:  Exercise    Improving Performance  Clinical staff conducted group or individual video education with verbal and written material and guidebook.  Patient learns to use a non-linear approach by alternating intensity levels and lengths of time spent exercising to help burn more calories and lose more body fat. Cardiovascular exercise helps improve heart health, metabolism, hormonal balance, blood sugar control, and recovery from fatigue. Resistance training improves strength, endurance, balance, coordination, reaction time, metabolism, and muscle mass. Flexibility exercise improves circulation, posture, and balance. Seek guidance from your physician and exercise physiologist before implementing an exercise routine and learn your capabilities and proper form for all exercise.  Introduction to Yoga  Clinical staff conducted group or individual video education with verbal and written material and guidebook.  Patient learns about  yoga, a discipline of the coming together of mind, breath, and body. The benefits of yoga include improved flexibility, improved range of motion, better posture and core strength, increased lung function, weight loss, and positive self-image. Yoga's heart health benefits include lowered blood pressure, healthier heart rate, decreased cholesterol and triglyceride levels, improved immune function, and reduced stress. Seek guidance from your physician and exercise physiologist before implementing an exercise routine and learn your capabilities and proper form for all exercise.  Medical   Aging: Enhancing Your Quality of Life  Clinical staff conducted group  or individual video education with verbal and written material and guidebook.  Patient learns key strategies and recommendations to stay in good physical health and enhance quality of life, such as prevention strategies, having an advocate, securing a Health Care Proxy and Power of Attorney, and keeping a list of medications and system for tracking them. It also discusses how to avoid risk for bone loss.  Biology of Weight Control  Clinical staff conducted group or individual video education with verbal and written material and guidebook.  Patient learns that weight gain occurs because we consume more calories than we burn (eating more, moving less). Even if your body weight is normal, you may have higher ratios of fat compared to muscle mass. Too much body fat puts you at increased risk for cardiovascular disease, heart attack, stroke, type 2 diabetes, and obesity-related cancers. In addition to exercise, following the Pritikin Eating Plan can help reduce your risk.  Decoding Lab Results  Clinical staff conducted group or individual video education with verbal and written material and guidebook.  Patient learns that lab test reflects one measurement whose values change over time and are influenced by many factors, including medication, stress, sleep,  exercise, food, hydration, pre-existing medical conditions, and more. It is recommended to use the knowledge from this video to become more involved with your lab results and evaluate your numbers to speak with your doctor.   Diseases of Our Time - Overview  Clinical staff conducted group or individual video education with verbal and written material and guidebook.  Patient learns that according to the CDC, 50% to 70% of chronic diseases (such as obesity, type 2 diabetes, elevated lipids, hypertension, and heart disease) are avoidable through lifestyle improvements including healthier food choices, listening to satiety cues, and increased physical activity.  Sleep Disorders Clinical staff conducted group or individual video education with verbal and written material and guidebook.  Patient learns how good quality and duration of sleep are important to overall health and well-being. Patient also learns about sleep disorders and how they impact health along with recommendations to address them, including discussing with a physician.  Nutrition  Dining Out - Part 2 Clinical staff conducted group or individual video education with verbal and written material and guidebook.  Patient learns how to plan ahead and communicate in order to maximize their dining experience in a healthy and nutritious manner. Included are recommended food choices based on the type of restaurant the patient is visiting.   Fueling a Banker conducted group or individual video education with verbal and written material and guidebook.  There is a strong connection between our food choices and our health. Diseases like obesity and type 2 diabetes are very prevalent and are in large-part due to lifestyle choices. The Pritikin Eating Plan provides plenty of food and hunger-curbing satisfaction. It is easy to follow, affordable, and helps reduce health risks.  Menu Workshop  Clinical staff conducted group or  individual video education with verbal and written material and guidebook.  Patient learns that restaurant meals can sabotage health goals because they are often packed with calories, fat, sodium, and sugar. Recommendations include strategies to plan ahead and to communicate with the manager, chef, or server to help order a healthier meal.  Planning Your Eating Strategy  Clinical staff conducted group or individual video education with verbal and written material and guidebook.  Patient learns about the Pritikin Eating Plan and its benefit of reducing the risk of disease. The Pritikin  Eating Plan does not focus on calories. Instead, it emphasizes high-quality, nutrient-rich foods. By knowing the characteristics of the foods, we choose, we can determine their calorie density and make informed decisions.  Targeting Your Nutrition Priorities  Clinical staff conducted group or individual video education with verbal and written material and guidebook.  Patient learns that lifestyle habits have a tremendous impact on disease risk and progression. This video provides eating and physical activity recommendations based on your personal health goals, such as reducing LDL cholesterol, losing weight, preventing or controlling type 2 diabetes, and reducing high blood pressure.  Vitamins and Minerals  Clinical staff conducted group or individual video education with verbal and written material and guidebook.  Patient learns different ways to obtain key vitamins and minerals, including through a recommended healthy diet. It is important to discuss all supplements you take with your doctor.   Healthy Mind-Set    Smoking Cessation  Clinical staff conducted group or individual video education with verbal and written material and guidebook.  Patient learns that cigarette smoking and tobacco addiction pose a serious health risk which affects millions of people. Stopping smoking will significantly reduce the risk of  heart disease, lung disease, and many forms of cancer. Recommended strategies for quitting are covered, including working with your doctor to develop a successful plan.  Culinary   Becoming a Set designer conducted group or individual video education with verbal and written material and guidebook.  Patient learns that cooking at home can be healthy, cost-effective, quick, and puts them in control. Keys to cooking healthy recipes will include looking at your recipe, assessing your equipment needs, planning ahead, making it simple, choosing cost-effective seasonal ingredients, and limiting the use of added fats, salts, and sugars.  Cooking - Breakfast and Snacks  Clinical staff conducted group or individual video education with verbal and written material and guidebook.  Patient learns how important breakfast is to satiety and nutrition through the entire day. Recommendations include key foods to eat during breakfast to help stabilize blood sugar levels and to prevent overeating at meals later in the day. Planning ahead is also a key component.  Cooking - Educational psychologist conducted group or individual video education with verbal and written material and guidebook.  Patient learns eating strategies to improve overall health, including an approach to cook more at home. Recommendations include thinking of animal protein as a side on your plate rather than center stage and focusing instead on lower calorie dense options like vegetables, fruits, whole grains, and plant-based proteins, such as beans. Making sauces in large quantities to freeze for later and leaving the skin on your vegetables are also recommended to maximize your experience.  Cooking - Healthy Salads and Dressing Clinical staff conducted group or individual video education with verbal and written material and guidebook.  Patient learns that vegetables, fruits, whole grains, and legumes are the foundations of  the Pritikin Eating Plan. Recommendations include how to incorporate each of these in flavorful and healthy salads, and how to create homemade salad dressings. Proper handling of ingredients is also covered. Cooking - Soups and [employer] - Soups and Desserts Clinical staff conducted group or individual video education with verbal and written material and guidebook.  Patient learns that Pritikin soups and desserts make for easy, nutritious, and delicious snacks and meal components that are low in sodium, fat, sugar, and calorie density, while high in vitamins, minerals, and filling fiber. Recommendations include simple  and healthy ideas for soups and desserts.   Overview     The Pritikin Solution Program Overview Clinical staff conducted group or individual video education with verbal and written material and guidebook.  Patient learns that the results of the Pritikin Program have been documented in more than 100 articles published in peer-reviewed journals, and the benefits include reducing risk factors for (and, in some cases, even reversing) high cholesterol, high blood pressure, type 2 diabetes, obesity, and more! An overview of the three key pillars of the Pritikin Program will be covered: eating well, doing regular exercise, and having a healthy mind-set.  WORKSHOPS  Exercise: Exercise Basics: Building Your Action Plan Clinical staff led group instruction and group discussion with PowerPoint presentation and patient guidebook. To enhance the learning environment the use of posters, models and videos may be added. At the conclusion of this workshop, patients will comprehend the difference between physical activity and exercise, as well as the benefits of incorporating both, into their routine. Patients will understand the FITT (Frequency, Intensity, Time, and Type) principle and how to use it to build an exercise action plan. In addition, safety concerns and other considerations for  exercise and cardiac rehab will be addressed by the presenter. The purpose of this lesson is to promote a comprehensive and effective weekly exercise routine in order to improve patients' overall level of fitness.   Managing Heart Disease: Your Path to a Healthier Heart Clinical staff led group instruction and group discussion with PowerPoint presentation and patient guidebook. To enhance the learning environment the use of posters, models and videos may be added.At the conclusion of this workshop, patients will understand the anatomy and physiology of the heart. Additionally, they will understand how Pritikin's three pillars impact the risk factors, the progression, and the management of heart disease.  The purpose of this lesson is to provide a high-level overview of the heart, heart disease, and how the Pritikin lifestyle positively impacts risk factors.  Exercise Biomechanics Clinical staff led group instruction and group discussion with PowerPoint presentation and patient guidebook. To enhance the learning environment the use of posters, models and videos may be added. Patients will learn how the structural parts of their bodies function and how these functions impact their daily activities, movement, and exercise. Patients will learn how to promote a neutral spine, learn how to manage pain, and identify ways to improve their physical movement in order to promote healthy living. The purpose of this lesson is to expose patients to common physical limitations that impact physical activity. Participants will learn practical ways to adapt and manage aches and pains, and to minimize their effect on regular exercise. Patients will learn how to maintain good posture while sitting, walking, and lifting.  Balance Training and Fall Prevention  Clinical staff led group instruction and group discussion with PowerPoint presentation and patient guidebook. To enhance the learning environment the use of  posters, models and videos may be added. At the conclusion of this workshop, patients will understand the importance of their sensorimotor skills (vision, proprioception, and the vestibular system) in maintaining their ability to balance as they age. Patients will apply a variety of balancing exercises that are appropriate for their current level of function. Patients will understand the common causes for poor balance, possible solutions to these problems, and ways to modify their physical environment in order to minimize their fall risk. The purpose of this lesson is to teach patients about the importance of maintaining balance as they age and  ways to minimize their risk of falling.  WORKSHOPS   Nutrition:  Fueling a Ship broker led group instruction and group discussion with PowerPoint presentation and patient guidebook. To enhance the learning environment the use of posters, models and videos may be added. Patients will review the foundational principles of the Pritikin Eating Plan and understand what constitutes a serving size in each of the food groups. Patients will also learn Pritikin-friendly foods that are better choices when away from home and review make-ahead meal and snack options. Calorie density will be reviewed and applied to three nutrition priorities: weight maintenance, weight loss, and weight gain. The purpose of this lesson is to reinforce (in a group setting) the key concepts around what patients are recommended to eat and how to apply these guidelines when away from home by planning and selecting Pritikin-friendly options. Patients will understand how calorie density may be adjusted for different weight management goals.  Mindful Eating  Clinical staff led group instruction and group discussion with PowerPoint presentation and patient guidebook. To enhance the learning environment the use of posters, models and videos may be added. Patients will briefly review the  concepts of the Pritikin Eating Plan and the importance of low-calorie dense foods. The concept of mindful eating will be introduced as well as the importance of paying attention to internal hunger signals. Triggers for non-hunger eating and techniques for dealing with triggers will be explored. The purpose of this lesson is to provide patients with the opportunity to review the basic principles of the Pritikin Eating Plan, discuss the value of eating mindfully and how to measure internal cues of hunger and fullness using the Hunger Scale. Patients will also discuss reasons for non-hunger eating and learn strategies to use for controlling emotional eating.  Targeting Your Nutrition Priorities Clinical staff led group instruction and group discussion with PowerPoint presentation and patient guidebook. To enhance the learning environment the use of posters, models and videos may be added. Patients will learn how to determine their genetic susceptibility to disease by reviewing their family history. Patients will gain insight into the importance of diet as part of an overall healthy lifestyle in mitigating the impact of genetics and other environmental insults. The purpose of this lesson is to provide patients with the opportunity to assess their personal nutrition priorities by looking at their family history, their own health history and current risk factors. Patients will also be able to discuss ways of prioritizing and modifying the Pritikin Eating Plan for their highest risk areas  Menu  Clinical staff led group instruction and group discussion with PowerPoint presentation and patient guidebook. To enhance the learning environment the use of posters, models and videos may be added. Using menus brought in from [employer], or printed from [employer], patients will apply the Pritikin dining out guidelines that were presented in the [employer] video. Patients will also be able to  practice these guidelines in a variety of provided scenarios. The purpose of this lesson is to provide patients with the opportunity to practice hands-on learning of the Pritikin Dining Out guidelines with actual menus and practice scenarios.  Label Reading Clinical staff led group instruction and group discussion with PowerPoint presentation and patient guidebook. To enhance the learning environment the use of posters, models and videos may be added. Patients will review and discuss the Pritikin label reading guidelines presented in Pritikin's Label Reading Educational series video. Using fool labels brought in from local grocery stores and  markets, patients will apply the label reading guidelines and determine if the packaged food meet the Pritikin guidelines. The purpose of this lesson is to provide patients with the opportunity to review, discuss, and practice hands-on learning of the Pritikin Label Reading guidelines with actual packaged food labels. Cooking School  Pritikin's [employer] are designed to teach patients ways to prepare quick, simple, and affordable recipes at home. The importance of nutrition's role in chronic disease risk reduction is reflected in its emphasis in the overall Pritikin program. By learning how to prepare essential core Pritikin Eating Plan recipes, patients will increase control over what they eat; be able to customize the flavor of foods without the use of added salt, sugar, or fat; and improve the quality of the food they consume. By learning a set of core recipes which are easily assembled, quickly prepared, and affordable, patients are more likely to prepare more healthy foods at home. These workshops focus on convenient breakfasts, simple entres, side dishes, and desserts which can be prepared with minimal effort and are consistent with nutrition recommendations for cardiovascular risk reduction. Cooking [employer] are taught by a Interior and spatial designer (RD) who has been trained by the [employer]. The chef or RD has a clear understanding of the importance of minimizing - if not completely eliminating - added fat, sugar, and sodium in recipes. Throughout the series of Cooking School Workshop sessions, patients will learn about healthy ingredients and efficient methods of cooking to build confidence in their capability to prepare    Cooking School weekly topics:  Adding Flavor- Sodium-Free  Fast and Healthy Breakfasts  Powerhouse Plant-Based Proteins  Satisfying Salads and Dressings  Simple Sides and Sauces  International Cuisine-Spotlight on the [employer] Zones  Delicious Desserts  Savory Soups  [employer] - Meals in a Astronomer Appetizers and Snacks  Comforting Weekend Breakfasts  One-Pot Wonders   Fast Evening Meals  Landscape architect Your Pritikin Plate  WORKSHOPS   Healthy Mindset (Psychosocial):  Focused Goals, Sustainable Changes Clinical staff led group instruction and group discussion with PowerPoint presentation and patient guidebook. To enhance the learning environment the use of posters, models and videos may be added. Patients will be able to apply effective goal setting strategies to establish at least one personal goal, and then take consistent, meaningful action toward that goal. They will learn to identify common barriers to achieving personal goals and develop strategies to overcome them. Patients will also gain an understanding of how our mind-set can impact our ability to achieve goals and the importance of cultivating a positive and growth-oriented mind-set. The purpose of this lesson is to provide patients with a deeper understanding of how to set and achieve personal goals, as well as the tools and strategies needed to overcome common obstacles which may arise along the way.  From Head to Heart: The Power of a Healthy Outlook  Clinical staff led group instruction and  group discussion with PowerPoint presentation and patient guidebook. To enhance the learning environment the use of posters, models and videos may be added. Patients will be able to recognize and describe the impact of emotions and mood on physical health. They will discover the importance of self-care and explore self-care practices which may work for them. Patients will also learn how to utilize the 4 C's to cultivate a healthier outlook and better manage stress and challenges. The purpose of this lesson is to demonstrate to patients how a  healthy outlook is an essential part of maintaining good health, especially as they continue their cardiac rehab journey.  Healthy Sleep for a Healthy Heart Clinical staff led group instruction and group discussion with PowerPoint presentation and patient guidebook. To enhance the learning environment the use of posters, models and videos may be added. At the conclusion of this workshop, patients will be able to demonstrate knowledge of the importance of sleep to overall health, well-being, and quality of life. They will understand the symptoms of, and treatments for, common sleep disorders. Patients will also be able to identify daytime and nighttime behaviors which impact sleep, and they will be able to apply these tools to help manage sleep-related challenges. The purpose of this lesson is to provide patients with a general overview of sleep and outline the importance of quality sleep. Patients will learn about a few of the most common sleep disorders. Patients will also be introduced to the concept of "sleep hygiene," and discover ways to self-manage certain sleeping problems through simple daily behavior changes. Finally, the workshop will motivate patients by clarifying the links between quality sleep and their goals of heart-healthy living.   Recognizing and Reducing Stress Clinical staff led group instruction and group discussion with PowerPoint presentation and  patient guidebook. To enhance the learning environment the use of posters, models and videos may be added. At the conclusion of this workshop, patients will be able to understand the types of stress reactions, differentiate between acute and chronic stress, and recognize the impact that chronic stress has on their health. They will also be able to apply different coping mechanisms, such as reframing negative self-talk. Patients will have the opportunity to practice a variety of stress management techniques, such as deep abdominal breathing, progressive muscle relaxation, and/or guided imagery.  The purpose of this lesson is to educate patients on the role of stress in their lives and to provide healthy techniques for coping with it.  Learning Barriers/Preferences:  Learning Barriers/Preferences - 05/28/23 1410       Learning Barriers/Preferences   Learning Barriers Sight   glasses   Learning Preferences Group Instruction;Individual Instruction;Skilled Demonstration;Verbal Instruction;Video;Written Material             Education Topics:  Knowledge Questionnaire Score:  Knowledge Questionnaire Score - 05/28/23 1410       Knowledge Questionnaire Score   Pre Score 23/24             Core Components/Risk Factors/Patient Goals at Admission:  Personal Goals and Risk Factors at Admission - 05/28/23 1410       Core Components/Risk Factors/Patient Goals on Admission    Weight Management Yes;Obesity    Intervention Weight Management: Develop a combined nutrition and exercise program designed to reach desired caloric intake, while maintaining appropriate intake of nutrient and fiber, sodium and fats, and appropriate energy expenditure required for the weight goal.;Weight Management: Provide education and appropriate resources to help participant work on and attain dietary goals.;Weight Management/Obesity: Establish reasonable short term and long term weight goals.;Obesity: Provide education  and appropriate resources to help participant work on and attain dietary goals.    Expected Outcomes Short Term: Continue to assess and modify interventions until short term weight is achieved;Long Term: Adherence to nutrition and physical activity/exercise program aimed toward attainment of established weight goal;Understanding recommendations for meals to include 15-35% energy as protein, 25-35% energy from fat, 35-60% energy from carbohydrates, less than 200mg  of dietary cholesterol, 20-35 gm of total fiber daily;Understanding of distribution of calorie  intake throughout the day with the consumption of 4-5 meals/snacks    Heart Failure Yes    Intervention Provide a combined exercise and nutrition program that is supplemented with education, support and counseling about heart failure. Directed toward relieving symptoms such as shortness of breath, decreased exercise tolerance, and extremity edema.    Expected Outcomes Improve functional capacity of life;Short term: Attendance in program 2-3 days a week with increased exercise capacity. Reported lower sodium intake. Reported increased fruit and vegetable intake. Reports medication compliance.;Short term: Daily weights obtained and reported for increase. Utilizing diuretic protocols set by physician.;Long term: Adoption of self-care skills and reduction of barriers for early signs and symptoms recognition and intervention leading to self-care maintenance.    Hypertension Yes    Intervention Provide education on lifestyle modifcations including regular physical activity/exercise, weight management, moderate sodium restriction and increased consumption of fresh fruit, vegetables, and low fat dairy, alcohol moderation, and smoking cessation.;Monitor prescription use compliance.    Expected Outcomes Short Term: Continued assessment and intervention until BP is < 140/71mm HG in hypertensive participants. < 130/57mm HG in hypertensive participants with diabetes,  heart failure or chronic kidney disease.;Long Term: Maintenance of blood pressure at goal levels.    Lipids Yes    Intervention Provide education and support for participant on nutrition & aerobic/resistive exercise along with prescribed medications to achieve LDL 70mg , HDL >40mg .    Expected Outcomes Short Term: Participant states understanding of desired cholesterol values and is compliant with medications prescribed. Participant is following exercise prescription and nutrition guidelines.;Long Term: Cholesterol controlled with medications as prescribed, with individualized exercise RX and with personalized nutrition plan. Value goals: LDL < 70mg , HDL > 40 mg.             Core Components/Risk Factors/Patient Goals Review:    Core Components/Risk Factors/Patient Goals at Discharge (Final Review):    ITP Comments:  ITP Comments     Row Name 05/28/23 1402           ITP Comments Dr. Gaylyn Keas medical director. Introduction to pritikin education/intensive cardiac rehab. Initial orientation packet reviewed with patient.                Comments: Participant attended orientation for the cardiac rehabilitation program on  05/28/2023  to perform initial intake and exercise walk test. Patient introduced to the [facility] Program education and orientation packet was reviewed. Completed 6-minute walk test, measurements, initial ITP, and exercise prescription. Vital signs stable. Telemetry-normal sinus rhythm PVCs, asymptomatic.   Service time was from 1309 to 1455.  Arlander Labrum, MS, ACSM-CEP 05/28/2023 3:34 PM

## 2023-05-28 NOTE — Progress Notes (Signed)
 Cardiac Rehab Medication Review   Does the patient  feel that his/her medications are working for him/her? Yes    Has the patient been experiencing any side effects to the medications prescribed? Yes  Does the patient measure his/her own blood pressure or blood glucose at home?   Yes  Does the patient have any problems obtaining medications due to transportation or finances?   No  Understanding of regimen: excellent Understanding of indications: excellent Potential of compliance: excellent    Comments: Kiani understands her medications and regime well. She checks her weight daily, has a BP cuff and checks BP occasionally.    Arlander Labrum, MS, ACSM-CEP 05/28/2023 2:13 PM

## 2023-05-29 ENCOUNTER — Other Ambulatory Visit (HOSPITAL_COMMUNITY): Payer: Self-pay | Admitting: Internal Medicine

## 2023-05-31 ENCOUNTER — Other Ambulatory Visit (HOSPITAL_COMMUNITY): Payer: Self-pay | Admitting: Internal Medicine

## 2023-05-31 ENCOUNTER — Other Ambulatory Visit: Payer: Self-pay | Admitting: Family Medicine

## 2023-06-04 ENCOUNTER — Other Ambulatory Visit (HOSPITAL_COMMUNITY): Payer: Self-pay | Admitting: Pharmacist

## 2023-06-04 ENCOUNTER — Encounter (HOSPITAL_COMMUNITY)
Admission: RE | Admit: 2023-06-04 | Discharge: 2023-06-04 | Discharge status: Home / Self Care | Source: Ambulatory Visit | Attending: Internal Medicine

## 2023-06-04 DIAGNOSIS — I5022 Chronic systolic (congestive) heart failure: Secondary | ICD-10-CM

## 2023-06-04 MED ORDER — IVABRADINE HCL 7.5 MG PO TABS: 7.5000 mg | ORAL_TABLET | Freq: Two times a day (BID) | ORAL | 11 refills | Status: AC

## 2023-06-04 NOTE — Progress Notes (Signed)
 Corlanor  refills sent to [facility].

## 2023-06-04 NOTE — Progress Notes (Signed)
 Daily Session Note  Patient Details  Name: Natalie Baker MRN: [id number] Date of Birth: [birth year] Referring Provider:   Flowsheet Row INTENSIVE CARDIAC Baker ORIENT from 05/28/2023 in [facility]  Referring Provider Natalie Oar, Natalie Baker       Encounter Date: 06/04/2023  Check In:  Session Check In - 06/04/23 1315       Check-In   Supervising physician immediately available to respond to emergencies Natalie Baker    Physician(s) Natalie Baker    Location Natalie Baker    Staff Present Natalie Ina, MS, Exercise Physiologist;Natalie Gaylene Kays, MS, ACSM-CEP, Exercise Physiologist;Natalie Baker BS, ACSM-CEP, Exercise Physiologist;Natalie Alexia Angelucci, MS, Exercise Physiologist;Natalie Nwosu, Natalie Baker, Natalie Baker    Virtual Visit No    Medication changes reported     No    Fall or balance concerns reported    No    Tobacco Cessation No Change    Warm-up and Cool-down Performed as group-led instruction   CRP2 orientation   Resistance Training Performed Yes    VAD Patient? No    PAD/SET Patient? No      Pain Assessment   Currently in Pain? No/denies    Pain Score 0-No pain    Multiple Pain Sites No             Capillary Blood Glucose: No results found for this or any previous visit (from the past 24 hours).    Social History   Tobacco Use  Smoking Status Former   Current packs/day: 0.00   Types: Cigarettes   Start date: 01/07/1981   Quit date: 01/08/1996   Years since quitting: 27.4  Smokeless Tobacco Never  Tobacco Comments   quit 1998 but is exposed to 2nd hand smoke    Goals Met:  No report of concerns or symptoms today  Goals Unmet:  HR  Comments: Patient here for her first day of exercise. Telemetry rhythm Sinus tach. Blood pressure 118/70 heart rate 104 at rest. Patient heart rate went up to the 130's on the recumbent bike. Exercise stopped. Natalie Baker says she is taking her medications as  prescribed. Upon review of Natalie Baker's medications. Natalie Baker says she has been out of her corlanor  for 2 days. I reviewed today's ECG tracings with our onsite provider Natalie Baker. Will Hold exercise until Natalie Baker is back on her Corlanor . I left a message on the pharmacy line as Natalie Baker says that she is having difficulty getting the medication refilled from her pharmacy.Natalie Baker    Dr. Gaylyn Keas is Medical Director for Cardiac [facility] at [facility].

## 2023-06-05 ENCOUNTER — Telehealth (HOSPITAL_COMMUNITY): Payer: Self-pay | Admitting: *Deleted

## 2023-06-05 NOTE — Telephone Encounter (Signed)
 Spoke with Dalene. [name] has picked up and restarted her Colanor. [name] will resume exercise on Monday. I advised [name] to hold off on exercising tomorrow to give [name] time to have the medication take effect. Patient states understanding.[name] RN BSN

## 2023-06-06 ENCOUNTER — Encounter (HOSPITAL_COMMUNITY)

## 2023-06-09 ENCOUNTER — Telehealth (HOSPITAL_COMMUNITY): Payer: Self-pay

## 2023-06-09 ENCOUNTER — Encounter (HOSPITAL_COMMUNITY): Admission: RE | Admit: 2023-06-09 | Source: Ambulatory Visit

## 2023-06-09 NOTE — Telephone Encounter (Signed)
 Patient c/o late for 12:30pm class, left message stating she overslept. Will be in on Wednesday.

## 2023-06-11 ENCOUNTER — Encounter (HOSPITAL_COMMUNITY)
Admission: RE | Admit: 2023-06-11 | Discharge: 2023-06-11 | Disposition: A | Discharge status: Home / Self Care | Source: Ambulatory Visit | Attending: Internal Medicine | Admitting: Internal Medicine

## 2023-06-11 DIAGNOSIS — Z8572 Personal history of non-Hodgkin lymphomas: Secondary | ICD-10-CM | POA: Insufficient documentation

## 2023-06-11 DIAGNOSIS — I5022 Chronic systolic (congestive) heart failure: Secondary | ICD-10-CM | POA: Insufficient documentation

## 2023-06-11 DIAGNOSIS — Z452 Encounter for adjustment and management of vascular access device: Secondary | ICD-10-CM | POA: Diagnosis not present

## 2023-06-11 NOTE — Progress Notes (Signed)
 Daily Session Note  Patient Details  Name: Natalie Baker MRN: [id number] Date of Birth: February 20, 1955 Referring Provider:   Flowsheet Row INTENSIVE CARDIAC REHAB ORIENT from 05/28/2023 in [facility]  Referring Provider Natalie Oar, MD       Encounter Date: 06/11/2023  Check In:  Session Check In - 06/11/23 1250       Check-In   Supervising physician immediately available to respond to emergencies [facility] - Physician supervision    Physician(s) Natalie Persons NP    Location [facility]    Staff Present Natalie Loving, MS, ACSM-CEP, Exercise Physiologist;Natalie Baker BS, ACSM-CEP, Exercise Physiologist;Natalie Alexia Angelucci, MS, Exercise Physiologist;Natalie Adriano, RN, Natalie Searing, MS, ACSM-CEP, CCRP, Exercise Physiologist    Virtual Visit No    Medication changes reported     No    Fall or balance concerns reported    No    Tobacco Cessation No Change    Warm-up and Cool-down Performed as group-led instruction   CRP2 orientation   Resistance Training Performed No    VAD Patient? No    PAD/SET Patient? No      Pain Assessment   Currently in Pain? No/denies    Pain Score 0-No pain    Multiple Pain Sites No             Capillary Blood Glucose: No results found for this or any previous visit (from the past 24 hours).   Exercise Prescription Changes - 06/11/23 1400       Response to Exercise   Blood Pressure (Admit) 112/62    Blood Pressure (Exercise) 122/62    Blood Pressure (Exit) 112/72    Heart Rate (Admit) 70 bpm    Heart Rate (Exercise) 104 bpm    Heart Rate (Exit) 81 bpm    Rating of Perceived Exertion (Exercise) 10    Symptoms None    Comments Pt's first day in the CRP2 program    Duration Continue with 30 min of aerobic exercise without signs/symptoms of physical distress.    Intensity THRR unchanged      Progression   Progression Continue to progress workloads to maintain  intensity without signs/symptoms of physical distress.    Average METs 2.4      Resistance Training   Training Prescription No    Weight No weights on wednesdays      Interval Training   Interval Training No      Recumbant Bike   Level 1    RPM 68    Watts 14    Minutes 15    METs 2      Track   Laps 14    Minutes 15    METs 2.79             Social History   Tobacco Use  Smoking Status Former   Current packs/day: 0.00   Types: Cigarettes   Start date: 01/07/1981   Quit date: 01/08/1996   Years since quitting: 27.4  Smokeless Tobacco Never  Tobacco Comments   quit 1998 but is exposed to 2nd hand smoke    Goals Met:  Exercise tolerated well No report of concerns or symptoms today Strength training completed today  Goals Unmet:  Not Applicable  Comments: Pt started cardiac rehab today.  Pt tolerated light exercise without difficulty. VSS, telemetry-Sinus Rhythm, asymptomatic.Natalie Baker is back on her  corlanor . Heart rate improved today. Medication list reconciled. Pt denies barriers to  medicaiton compliance.  PSYCHOSOCIAL ASSESSMENT:  PHQ-2. Pt exhibits positive coping skills, hopeful outlook with supportive family. No psychosocial needs identified at this time, no psychosocial interventions needed.   Pt enjoys spending time with her daughter and and housework.   Pt oriented to exercise equipment and routine.    Understanding verbalized. Natalie Antonio RN BSN    Dr. Gaylyn Baker is Medical Director for Cardiac Rehab at [facility]

## 2023-06-13 ENCOUNTER — Encounter (HOSPITAL_COMMUNITY)
Admission: RE | Admit: 2023-06-13 | Discharge: 2023-06-13 | Disposition: A | Discharge status: Home / Self Care | Source: Ambulatory Visit | Attending: Internal Medicine | Admitting: Internal Medicine

## 2023-06-13 ENCOUNTER — Other Ambulatory Visit

## 2023-06-13 ENCOUNTER — Encounter: Payer: Self-pay | Admitting: Gastroenterology

## 2023-06-13 ENCOUNTER — Ambulatory Visit: Admitting: Gastroenterology

## 2023-06-13 ENCOUNTER — Inpatient Hospital Stay: Attending: Internal Medicine

## 2023-06-13 VITALS — BP 102/58 | HR 76 | Ht 62.0 in | Wt 179.4 lb

## 2023-06-13 DIAGNOSIS — R140 Abdominal distension (gaseous): Secondary | ICD-10-CM | POA: Diagnosis not present

## 2023-06-13 DIAGNOSIS — R1010 Upper abdominal pain, unspecified: Secondary | ICD-10-CM

## 2023-06-13 DIAGNOSIS — Z452 Encounter for adjustment and management of vascular access device: Secondary | ICD-10-CM | POA: Insufficient documentation

## 2023-06-13 DIAGNOSIS — K760 Fatty (change of) liver, not elsewhere classified: Secondary | ICD-10-CM

## 2023-06-13 DIAGNOSIS — R142 Eructation: Secondary | ICD-10-CM

## 2023-06-13 DIAGNOSIS — K5900 Constipation, unspecified: Secondary | ICD-10-CM

## 2023-06-13 DIAGNOSIS — I5022 Chronic systolic (congestive) heart failure: Secondary | ICD-10-CM

## 2023-06-13 DIAGNOSIS — Z95828 Presence of other vascular implants and grafts: Secondary | ICD-10-CM

## 2023-06-13 DIAGNOSIS — R112 Nausea with vomiting, unspecified: Secondary | ICD-10-CM

## 2023-06-13 DIAGNOSIS — Z8572 Personal history of non-Hodgkin lymphomas: Secondary | ICD-10-CM | POA: Insufficient documentation

## 2023-06-13 DIAGNOSIS — R09A2 Foreign body sensation, throat: Secondary | ICD-10-CM

## 2023-06-13 LAB — CBC WITH DIFFERENTIAL/PLATELET
Basophils Absolute: 0 10*3/uL (ref 0.0–0.1)
Basophils Relative: 0.9 % (ref 0.0–3.0)
Eosinophils Absolute: 0 10*3/uL (ref 0.0–0.7)
Eosinophils Relative: 0.8 % (ref 0.0–5.0)
HCT: 37.7 % (ref 36.0–46.0)
Hemoglobin: 12.1 g/dL (ref 12.0–15.0)
Lymphocytes Relative: 9 % — ABNORMAL LOW (ref 12.0–46.0)
Lymphs Abs: 0.4 10*3/uL — ABNORMAL LOW (ref 0.7–4.0)
MCHC: 32.1 g/dL (ref 30.0–36.0)
MCV: 84.2 fl (ref 78.0–100.0)
Monocytes Absolute: 0.6 10*3/uL (ref 0.1–1.0)
Monocytes Relative: 13.8 % — ABNORMAL HIGH (ref 3.0–12.0)
Neutro Abs: 3.1 10*3/uL (ref 1.4–7.7)
Neutrophils Relative %: 75.5 % (ref 43.0–77.0)
Platelets: 359 10*3/uL (ref 150.0–400.0)
RBC: 4.48 Mil/uL (ref 3.87–5.11)
RDW: 16.8 % — ABNORMAL HIGH (ref 11.5–15.5)
WBC: 4.1 10*3/uL (ref 4.0–10.5)

## 2023-06-13 LAB — COMPREHENSIVE METABOLIC PANEL WITH GFR
ALT: 13 U/L (ref 0–35)
AST: 18 U/L (ref 0–37)
Albumin: 4.1 g/dL (ref 3.5–5.2)
Alkaline Phosphatase: 105 U/L (ref 39–117)
BUN: 13 mg/dL (ref 6–23)
CO2: 34 meq/L — ABNORMAL HIGH (ref 19–32)
Calcium: 9.6 mg/dL (ref 8.4–10.5)
Chloride: 94 meq/L — ABNORMAL LOW (ref 96–112)
Creatinine, Ser: 1.33 mg/dL — ABNORMAL HIGH (ref 0.40–1.20)
GFR: 41.37 mL/min — ABNORMAL LOW (ref >60.00)
Glucose, Bld: 116 mg/dL — ABNORMAL HIGH (ref 70–99)
Potassium: 3.4 meq/L — ABNORMAL LOW (ref 3.5–5.1)
Sodium: 136 meq/L (ref 135–145)
Total Bilirubin: 0.4 mg/dL (ref 0.2–1.2)
Total Protein: 6.8 g/dL (ref 6.0–8.3)

## 2023-06-13 MED ORDER — SODIUM CHLORIDE 0.9% FLUSH
10.0000 mL | INTRAVENOUS | Status: DC | PRN
  Administered 2023-06-13: 10 mL

## 2023-06-13 MED ORDER — PANTOPRAZOLE SODIUM 40 MG PO TBEC: 40.0000 mg | DELAYED_RELEASE_TABLET | Freq: Every day | ORAL | 3 refills | Status: DC

## 2023-06-13 MED ORDER — HEPARIN SOD (PORK) LOCK FLUSH 100 UNIT/ML IV SOLN
500.0000 [IU] | Freq: Once | INTRAVENOUS | Status: AC | PRN
  Administered 2023-06-13: 500 [IU]

## 2023-06-13 NOTE — Patient Instructions (Signed)
 You have been given a testing kit to check for small intestine bacterial overgrowth (SIBO) which is completed by a company named [employer]. Make sure to return your test in the mail using the return mailing label given to you along with the kit. The test order, your demographic and insurance information have all already been sent to the company. [facility] will collect an upfront charge of $99.74 for commercial insurance plans and $209.74 if you are paying cash. Make sure to discuss with [facility] PRIOR to having the test to see if they have gotten information from your insurance company as to how much your testing will cost out of pocket, if any. Please contact [facility] at phone number [phone] to get instructions regarding how to perform the test as our office is unable to give specific testing instructions.   Your provider has ordered "Diatherix" stool testing for you. You have received a kit from our office today containing all necessary supplies to complete this test. Please carefully read the stool collection instructions provided in the kit before opening the accompanying materials. In addition, be sure there is a label providing your full name and date of birth on the "puritan opti-swab" tube that is supplied in the kit (if you do not see a label with this information on your test tube, please make us  aware before test collection!). After completing the test, you should secure the purtian tube into the specimen biohazard bag. The [facility] E-Req sheet (including date and time of specimen collection) should be placed into the outside pocket of the specimen biohazard bag and returned to the [place] lab (basement floor of [facility]) within 3 days of collection. Please make sure to give the specimen to a staff member at the lab. DO NOT leave the specimen on the counter.   If the specimen date and time (can be found in the upper right boxed  portion of the sheet) are not filled out on the E-Req sheet, the test will NOT be performed.    Your provider has requested that you go to the basement level for lab work before leaving today. Press "B" on the elevator. The lab is located at the first door on the left as you exit the elevator.  Due to recent changes in healthcare laws, you may see the results of your imaging and laboratory studies on MyChart before your provider has had a chance to review them.  We understand that in some cases there may be results that are confusing or concerning to you. Not all laboratory results come back in the same time frame and the provider may be waiting for multiple results in order to interpret others.  Please give us  48 hours in order for your provider to thoroughly review all the results before contacting the office for clarification of your results.    We have sent the following medications to your pharmacy for you to pick up at your convenience: Protonix  (Pantoprazole ) 40 mg daily.  Thank you for trusting me with your gastrointestinal care!   Valiant Gaul, PA-C  _______________________________________________________  If your blood pressure at your visit was 140/90 or greater, please contact your primary care physician to follow up on this.  _______________________________________________________  If you are age 16 or older, your body mass index should be between 23-30. Your Body mass index is 32.82 kg/m. If this is out of the aforementioned range listed, please consider follow up with your Primary Care Provider.  If you are  age 68 or younger, your body mass index should be between 19-25. Your Body mass index is 32.82 kg/m. If this is out of the aformentioned range listed, please consider follow up with your Primary Care Provider.   ________________________________________________________  The [facility] providers would like to encourage you to use MYCHART to communicate with providers for  non-urgent requests or questions.  Due to long hold times on the telephone, sending your provider a message by [facility] may be a faster and more efficient way to get a response.  Please allow 48 business hours for a response.  Please remember that this is for non-urgent requests.  _______________________________________________________

## 2023-06-13 NOTE — Progress Notes (Addendum)
 Natalie Baker [id number] 12-Jan-1955   Chief Complaint: Nausea, vomiting  Referring Provider: [name], MD Primary GI MD: Dr. Willy Harvest  HPI: Natalie Baker is a 68 y.o. female with past medical history of hypothyroidism, obesity, HLD, CHF (LVEF 35% on echo 2024), osteoporosis, non-Hodgkin's lymphoma 12/2018 treated with R-CHOP, cholecystectomy 2022, abdominal hysterectomy who presents today for a complaint of nausea and vomiting.    Patient last seen in office 08/23/2013 by Dr. Willy Harvest for epigastric pain and diarrhea.  Advised to take Levsin. H. Pylori stool test ordered but not completed.  Last visit with cardiology 05/21/2023.  Per their note, patient had abdominal pain and nausea a few months back which improved after oncology arranged for IV fluids.  Cut back her torsemide  dose since symptoms continued to improve.  She has been referred to cardiac rehab.   Today patient states she has been having nausea almost daily for the last year.  It can be present first thing in the morning, or occur after meals. Sometimes she gags when brushing her teeth.  She has a chronic cough which she attributes to either her chemotherapy induced heart failure or some of her medications.  Sometimes this cough triggers gagging and vomiting.  She denies dysphagia, but sometimes feels like she has something sitting in her throat after eating, even though she knows that food has passed into her stomach.  She denies any burning chest pain or acid regurgitation.  Still has an appetite, eats regularly throughout the day.  She reports history of ulcers which were seen on an upper endoscopy years ago.  She is unsure where this was done.  Unsure if she has ever been diagnosed with H. pylori infection.  She denies ever taking any antireflux medications.  She typically has a bowel movement every day, sometimes twice a day.  Bowel movements can be small, sometimes has to strain with bowel movements.  She  takes a stool softener or Senokot as needed for occasional constipation which works well for her.  She denies any blood in her stool or melena.  Denies diarrhea.  She has hemorrhoids which flareup occasionally, but are not bothering her now. Denies blood in her stool or melena.  Previous GI Procedures/Imaging   CT chest, abdomen, pelvis 04/10/2023 1. No evidence of lymphadenopathy or splenomegaly. 2. Bilateral patchy nodular consolidative opacities, with mild interstitial thickening, favored infectious/inflammatory. Suggest follow-up dedicated chest CT in 2-3 months to ensure resolution. 3. Diffuse hepatic steatosis. 4. Aortic atherosclerosis.  Colonoscopy 03/20/2018 - Mild diverticulosis in the sigmoid colon.  - The colon examination was otherwise normal on direct and retroflexion views.  - Perianal skin tags found on perianal exam.  - No specimens collected. - Recall 10 years  Colonoscopy 12/29/2006 7 mm splenic flexure polyp removed, grade 2 internal/external hemorrhoids, otherwise normal, excellent prep Path:  COLON, SPLENIC FLEXURE POLYP: HYPERPLASTIC POLYP. NO   ADENOMATOUS CHANGE OR MALIGNANCY IDENTIFIED.   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 CHOP and others   Obesity    BMI 34   Osteoporosis    most recent DEXA 02/2022   Pre-diabetes  Secondary nonischemic congestive cardiomyopathy (HCC)    Adriamycin  2020   Ventricular tachycardia (HCC)    Vitamin D  deficiency     Past Surgical History:  Procedure Laterality Date   ABDOMINAL HYSTERECTOMY  1997   CESAREAN SECTION  1981   CHOLECYSTECTOMY N/A 09/25/2020   Procedure: LAPAROSCOPIC  CHOLECYSTECTOMY;  Surgeon: Derral Flick, MD;  Location: [facility];  Service: General;  Laterality: N/A;  60 MINUTES   COLONOSCOPY     03/2018 NO POLYPS   COLONOSCOPY W/ BIOPSIES  12/29/2006   IR IMAGING GUIDED PORT INSERTION  01/15/2019   LAPAROSCOPY     1991 for emdometriosis   RIGHT/LEFT HEART CATH AND CORONARY ANGIOGRAPHY N/A 06/25/2019   Procedure: RIGHT/LEFT HEART CATH AND CORONARY ANGIOGRAPHY;  Surgeon: Mardell Shade, MD;  Location: [facility];  Service: Cardiovascular;  Laterality: N/A;   TEE WITHOUT CARDIOVERSION N/A 07/04/2021   Procedure: TRANSESOPHAGEAL ECHOCARDIOGRAM (TEE);  Surgeon: Mardell Shade, MD;  Location: [facility] ENDOSCOPY;  Service: Cardiovascular;  Laterality: N/A;   TRANSTHORACIC ECHOCARDIOGRAM     05/17/22 ejection fraction 35% (unchanged from 2023), global hypokinesis, mild RV dysfunction, mild to moderate MR.   UPPER GASTROINTESTINAL ENDOSCOPY      Current Outpatient Medications  Medication Sig Dispense Refill   amiodarone  (PACERONE ) 200 MG tablet Take 0.5 tablets (100 mg total) by mouth daily. 45 tablet 3   Cholecalciferol  (VITAMIN D -3) 25 MCG (1000 UT) CAPS Take 1 capsule by mouth daily.     ELIQUIS  5 MG TABS tablet TAKE 1 TABLET BY MOUTH TWICE A DAY 60 tablet 6   estradiol  (VIVELLE -DOT) 0.05 MG/24HR patch Place 1 patch onto the skin 2 (two) times a week. Tuesdays & Fridays     FARXIGA  10 MG TABS tablet TAKE 1 TABLET BY MOUTH EVERY DAY 30 tablet 11   ivabradine  (CORLANOR ) 7.5 MG TABS tablet Take 1 tablet (7.5 mg total) by mouth 2 (two) times daily with a meal. 60 tablet 11   levothyroxine  (SYNTHROID ) 100 MCG tablet TAKE 1 TABLET BY MOUTH DAILY ON AN EMPTY STOMACH WITH ONLY WATER FOR 30 MINUTES & NO ANTACID/CALCIUM /MAGNESIUM  FOR 4 HOURS & AVOID BIOTIN 90 tablet 0   losartan  (COZAAR ) 25 MG tablet Take 0.5 tablets (12.5 mg total) by mouth daily. 45 tablet 3   ondansetron  (ZOFRAN -ODT) 4 MG disintegrating tablet Take 1 tablet (4 mg total) by mouth  every 8 (eight) hours as needed for nausea or vomiting. 30 tablet 3   potassium chloride  (KLOR-CON ) 10 MEQ tablet Take 4 tablets (40 mEq total) by mouth daily. 120 tablet 11   pravastatin  (PRAVACHOL ) 20 MG tablet Take 1 tablet (20 mg total) by mouth daily. (Patient not taking: Reported on 06/04/2023) 90 tablet 0   prochlorperazine  (COMPAZINE ) 10 MG tablet TAKE 1 TABLET BY MOUTH EVERY 6 HOURS AS NEEDED FOR NAUSEA OR VOMITING. 30 tablet 0   spironolactone  (ALDACTONE ) 25 MG tablet TAKE 1 TABLET BY MOUTH EVERY DAY 90 tablet 3   torsemide  (DEMADEX ) 20 MG tablet Take 2 tablets (40 mg total) by mouth daily. (Patient taking differently: Take 20 mg by mouth daily.) 60 tablet 11   No current facility-administered medications for this visit.    Allergies as of 06/13/2023 - Review Complete 05/21/2023  Allergen Reaction Noted   Adriamycin  [doxorubicin ] Other (See Comments) 06/29/2021   Ppd [tuberculin purified protein derivative] Swelling 12/16/2012    Family History  Problem Relation Age of Onset   Heart disease Mother 21  chf   Diabetes Mother    Thyroid  disease Mother    Cancer Mother 25       female/ skin   Hypertension Mother    Liver disease Mother    Hypertension Father    Alcohol abuse Father    Heart disease Father    Breast cancer Sister 6   Diabetes Sister    Hypothyroidism Sister    Hashimoto's thyroiditis Sister    Hyperlipidemia Brother    Hypertension Brother        x 2   Diabetes Brother    Hypothyroidism Brother    Prostate cancer Brother 58   Liver disease Maternal Grandmother    Diabetes Paternal Grandmother    Breast cancer Maternal Aunt    Hyperlipidemia Maternal Aunt    Heart disease Maternal Aunt    Bone cancer Maternal Aunt        metastatic   Breast cancer Niece 11   Colon cancer Neg Hx    Colon polyps Neg Hx    Esophageal cancer Neg Hx    Rectal cancer Neg Hx    Stomach cancer Neg Hx     Social History   Tobacco Use   Smoking status: Former     Current packs/day: 0.00    Types: Cigarettes    Start date: 01/07/1981    Quit date: 01/08/1996    Years since quitting: 27.4   Smokeless tobacco: Never   Tobacco comments:    quit 1998 but is exposed to 2nd hand smoke  Vaping Use   Vaping status: Never Used  Substance Use Topics   Alcohol use: No   Drug use: No     Review of Systems:    Constitutional: No fever, chills, weakness or fatigue Skin: No rash or itching Cardiovascular: No chest pain, chest pressure or palpitations   Respiratory: No SOB Gastrointestinal: See HPI and otherwise negative Neurological: No headache, dizziness or syncope Musculoskeletal: No new muscle or joint pain Hematologic: No bleeding or bruising    Physical Exam:  Vital signs: BP (!) 102/58   Pulse 76   Ht 5\' 2"  (1.575 m)   Wt 179 lb 7 oz (81.4 kg)   SpO2 97%   BMI 32.82 kg/m    Constitutional: NAD, Well developed, Well nourished, alert and cooperative Head:  Normocephalic and atraumatic.  Eyes: No scleral icterus. Conjunctiva pink. Mouth: No oral lesions. Respiratory: Respirations even and unlabored. Lungs clear to auscultation bilaterally.  No wheezes, crackles, or rhonchi.  Cardiovascular:  Regular rate and rhythm. No murmurs heard. No peripheral edema. Gastrointestinal:  Soft, nondistended, nontender. No rebound or guarding. Normal bowel sounds. No appreciable masses or hepatomegaly. Rectal:  Not performed.  Neurologic:  Alert and oriented x4;  grossly normal neurologically.  Skin:   Dry and intact without significant lesions or rashes. Psychiatric: Oriented to person, place and time. Demonstrates good judgement and reason without abnormal affect or behaviors.   RELEVANT LABS AND IMAGING: CBC    Component Value Date/Time   WBC 3.3 (L) 05/01/2023 1325   WBC 3.2 (L) 10/30/2022 1431   RBC 4.25 05/01/2023 1325   HGB 11.8 (L) 05/01/2023 1325   HCT 36.3 05/01/2023 1325   PLT 411 (H) 05/01/2023 1325   MCV 85.4 05/01/2023 1325   MCH  27.8 05/01/2023 1325   MCHC 32.5 05/01/2023 1325   RDW 15.9 (H) 05/01/2023 1325   LYMPHSABS 0.4 (L) 05/01/2023 1325   MONOABS 0.4 05/01/2023 1325   EOSABS 0.0 05/01/2023  1325   BASOSABS 0.0 05/01/2023 1325    CMP     Component Value Date/Time   NA 137 05/21/2023 1448   K 3.7 05/21/2023 1448   CL 96 (L) 05/21/2023 1448   CO2 29 05/21/2023 1448   GLUCOSE 109 (H) 05/21/2023 1448   BUN 13 05/21/2023 1448   CREATININE 1.22 (H) 05/21/2023 1448   CREATININE 1.08 (H) 05/01/2023 1325   CREATININE 1.06 (H) 12/13/2022 0956   CALCIUM  9.3 05/21/2023 1448   PROT 6.6 05/01/2023 1325   ALBUMIN 3.9 05/01/2023 1325   AST 20 05/01/2023 1325   ALT 15 05/01/2023 1325   ALKPHOS 114 05/01/2023 1325   BILITOT 0.4 05/01/2023 1325   GFRNONAA 49 (L) 05/21/2023 1448   GFRNONAA 56 (L) 05/01/2023 1325   GFRNONAA 86 12/21/2018 1513   GFRAA >60 09/30/2019 1128   GFRAA >60 06/02/2019 1430   GFRAA 100 12/21/2018 1513   Echocardiogram 05/17/2022 1. Compared to echo images from 2023, no significant change in overall LVEF.  2. Some of apical views are foreshortened.  Left ventricular ejection fraction, by estimation, is 35% . The left ventricle has moderately decreased function. The left ventricle demonstrates global hypokinesis. Left ventricular diastolic parameters are indeterminate.  3. Right ventricular systolic function is mildly reduced. The right ventricular size is normal. There is normal pulmonary artery systolic pressure.  4. Left atrial size was mildly dilated.  5. Mild to moderate mitral valve regurgitation.  6. The aortic valve is tricuspid. Aortic valve regurgitation is not visualized. Aortic valve sclerosis/ calcification is present, without any evidence of aortic stenosis.  7. The inferior vena cava is normal in size with < 50% respiratory variability, suggesting right atrial pressure of 8 mmHg.  Assessment/Plan:   Nausea with occasional vomiting Globus sensation Abdominal gas and  bloating Occasional constipation  Patient with symptoms of nausea with occasional vomiting (seems to occur mostly after coughing triggers gag reflex), abdominal bloating with associated upper abdominal pain, ongoing for about a year. Zofran  does help with nausea.  Sometimes feels nauseated first thing in the morning, or can occur after brushing her teeth or after eating.  Does not occur with every meal.   She has also noticed increased abdominal gas and bloating, with associated dull upper abdominal pain.  No concerning findings on CT chest/abdomen/pelvis 04/2023.  She reports having an upper endoscopy years ago at which time she believes she was found to have an ulcer.  She has not been taking any antireflux medications, not having any burning chest pain or acid reflux but does have a globus sensation after eating.  - Repeat labs: CBC, CMP - Order H. Pylori stool test - Will order breath test to evaluate for possible SIBO - Start Protonix  40mg  daily for 6 weeks - If workup negative and symptoms persist on Protonix , consider gastric emptying study vs EGD. Any endoscopic procedures would need to be done in the hospital setting based on patient's cardiac history.  Hepatic steatosis - FIB-4 score of 0.84, stage 0-1, advanced fibrosis excluded - Can repeat abdominal imaging in 6 months   Valiant Gaul, PA-C [facility] 06/13/2023, 1:18 PM  Patient Care Team: [name], MD as PCP - General (Family Medicine) Kenney Peacemaker, MD as Consulting Physician (Gastroenterology) Rogene Claude, MD as Consulting Physician (Obstetrics and Gynecology) Eduardo Grade, MD as Consulting Physician (Dermatology) Marlene Simas, MD as Consulting Physician (Oncology) Bensimhon, Rheta Celestine, MD as Consulting Physician (Cardiology)    Primary gastroenterologist:  Agree w/  evaluation and plans. If persistent issues would pursue EGD next as opposed to GES Re: Steatosis - would do repeat FIB4  prior to repeating imaging.  Wt Readings from Last 3 Encounters:  06/13/23 179 lb 7 oz (81.4 kg)  05/28/23 180 lb 1.9 oz (81.7 kg)  05/21/23 177 lb 3.2 oz (80.4 kg)  193# 01/2023  Had flu in January - weight stable since   Kenney Peacemaker, MD, [facility]

## 2023-06-16 ENCOUNTER — Encounter (HOSPITAL_COMMUNITY)
Admission: RE | Admit: 2023-06-16 | Discharge: 2023-06-16 | Disposition: A | Discharge status: Home / Self Care | Source: Ambulatory Visit | Attending: Internal Medicine | Admitting: Internal Medicine

## 2023-06-16 DIAGNOSIS — I5022 Chronic systolic (congestive) heart failure: Secondary | ICD-10-CM | POA: Diagnosis not present

## 2023-06-17 ENCOUNTER — Ambulatory Visit: Payer: Medicare Other | Admitting: Nurse Practitioner

## 2023-06-18 ENCOUNTER — Telehealth: Payer: Self-pay

## 2023-06-18 ENCOUNTER — Ambulatory Visit: Payer: Self-pay | Admitting: Gastroenterology

## 2023-06-18 ENCOUNTER — Encounter (HOSPITAL_COMMUNITY)
Admission: RE | Admit: 2023-06-18 | Discharge: 2023-06-18 | Disposition: A | Discharge status: Home / Self Care | Source: Ambulatory Visit | Attending: Internal Medicine | Admitting: Internal Medicine

## 2023-06-18 DIAGNOSIS — I5022 Chronic systolic (congestive) heart failure: Secondary | ICD-10-CM | POA: Diagnosis not present

## 2023-06-18 NOTE — Telephone Encounter (Signed)
 Appt made for 08/18/23 at 8:20 am with Sara-letter mailed

## 2023-06-18 NOTE — Telephone Encounter (Signed)
-----   Message from Sharmaine Dearth sent at 06/18/2023 10:10 AM EDT ----- Please schedule patient for office follow up with me in 2 months, thank you.

## 2023-06-20 ENCOUNTER — Encounter (HOSPITAL_COMMUNITY)

## 2023-06-23 ENCOUNTER — Encounter (HOSPITAL_COMMUNITY)
Admission: RE | Admit: 2023-06-23 | Discharge: 2023-06-23 | Disposition: A | Discharge status: Home / Self Care | Source: Ambulatory Visit | Attending: Internal Medicine | Admitting: Internal Medicine

## 2023-06-23 DIAGNOSIS — I5022 Chronic systolic (congestive) heart failure: Secondary | ICD-10-CM | POA: Diagnosis not present

## 2023-06-24 NOTE — Progress Notes (Signed)
 Cardiac Individual Treatment Plan  Patient Details  Name: Natalie Baker MRN: [id number] Date of Birth: [birth year] Referring Provider:   Flowsheet Row INTENSIVE CARDIAC REHAB ORIENT from 05/28/2023 in [facility]  Referring Provider Jules Oar, MD    Initial Encounter Date:  Flowsheet Row INTENSIVE CARDIAC REHAB ORIENT from 05/28/2023 in [facility]  Date 05/28/23    Visit Diagnosis: Heart failure, chronic systolic (HCC)  Patient's Home Medications on Admission:  Current Outpatient Medications:    amiodarone  (PACERONE ) 200 MG tablet, Take 0.5 tablets (100 mg total) by mouth daily., Disp: 45 tablet, Rfl: 3   Cholecalciferol  (VITAMIN D -3) 25 MCG (1000 UT) CAPS, Take 1 capsule by mouth daily., Disp: , Rfl:    ELIQUIS  5 MG TABS tablet, TAKE 1 TABLET BY MOUTH TWICE A DAY, Disp: 60 tablet, Rfl: 6   estradiol  (VIVELLE -DOT) 0.05 MG/24HR patch, Place 1 patch onto the skin 2 (two) times a week. Tuesdays & Fridays, Disp: , Rfl:    FARXIGA  10 MG TABS tablet, TAKE 1 TABLET BY MOUTH EVERY DAY, Disp: 30 tablet, Rfl: 11   ivabradine  (CORLANOR ) 7.5 MG TABS tablet, Take 1 tablet (7.5 mg total) by mouth 2 (two) times daily with a meal., Disp: 60 tablet, Rfl: 11   levothyroxine  (SYNTHROID ) 100 MCG tablet, TAKE 1 TABLET BY MOUTH DAILY ON AN EMPTY STOMACH WITH ONLY WATER FOR 30 MINUTES & NO ANTACID/CALCIUM /MAGNESIUM  FOR 4 HOURS & AVOID BIOTIN, Disp: 90 tablet, Rfl: 0   losartan  (COZAAR ) 25 MG tablet, Take 0.5 tablets (12.5 mg total) by mouth daily., Disp: 45 tablet, Rfl: 3   ondansetron  (ZOFRAN -ODT) 4 MG disintegrating tablet, Take 1 tablet (4 mg total) by mouth every 8 (eight) hours as needed for nausea or vomiting., Disp: 30 tablet, Rfl: 3   pantoprazole  (PROTONIX ) 40 MG tablet, Take 1 tablet (40 mg total) by mouth daily., Disp: 30 tablet, Rfl: 3   potassium chloride  (KLOR-CON ) 10 MEQ  tablet, Take 4 tablets (40 mEq total) by mouth daily., Disp: 120 tablet, Rfl: 11   pravastatin  (PRAVACHOL ) 20 MG tablet, Take 1 tablet (20 mg total) by mouth daily. (Patient not taking: Reported on 06/13/2023), Disp: 90 tablet, Rfl: 0   prochlorperazine  (COMPAZINE ) 10 MG tablet, TAKE 1 TABLET BY MOUTH EVERY 6 HOURS AS NEEDED FOR NAUSEA OR VOMITING., Disp: 30 tablet, Rfl: 0   spironolactone  (ALDACTONE ) 25 MG tablet, TAKE 1 TABLET BY MOUTH EVERY DAY, Disp: 90 tablet, Rfl: 3   torsemide  (DEMADEX ) 20 MG tablet, Take 2 tablets (40 mg total) by mouth daily. 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 [facility] and others   Obesity    BMI 34   Osteoporosis    most recent DEXA 02/2022   Pre-diabetes    Secondary nonischemic congestive cardiomyopathy (HCC)    Adriamycin  2020   Ventricular tachycardia (HCC)    Vitamin D  deficiency     Tobacco Use: Social History   Tobacco Use  Smoking Status Former   Current packs/day: 0.00   Types: Cigarettes   Start date: 01/07/1981   Quit date: 01/08/1996   Years since quitting: 27.4  Smokeless Tobacco Never  Tobacco Comments   quit 1998 but is exposed to 2nd hand smoke    Labs: Review Flowsheet  More data exists      Latest Ref Rng & Units 03/17/2021 03/18/2021 03/19/2021 09/05/2021 12/13/2022  Labs for ITP Cardiac and Pulmonary Rehab  Cholestrol <200 mg/dL - - - 147  829   LDL (calc) mg/dL (calc) - - - 562  130   HDL-C > OR = 50 mg/dL - - - 59  67   Trlycerides <150 mg/dL - - - 865  784   Hemoglobin A1c <5.7 % of total Hgb - - - 5.5   5.8   O2 Saturation % 71.3  59.6  60  - -    Capillary Blood Glucose: Lab Results  Component Value Date   GLUCAP 100 (H) 10/31/2021   GLUCAP 85 09/25/2020   GLUCAP 101 (H) 09/21/2020   GLUCAP 117 (H) 06/26/2019   GLUCAP 104 (H) 06/10/2019     Exercise Target Goals: Exercise Program Goal: Individual exercise prescription set using results from initial 6 min walk test and THRR while considering  patient's activity barriers and safety.   Exercise Prescription Goal: Initial exercise prescription builds to 30-45 minutes a day of aerobic activity, 2-3 days per week.  Home exercise guidelines will be given to patient during program as part of exercise prescription that the participant will acknowledge.  Activity Barriers & Risk Stratification:  Activity Barriers & Cardiac Risk Stratification - 05/28/23 1409       Activity Barriers & Cardiac Risk Stratification   Activity Barriers Deconditioning;Decreased Ventricular Function;Balance Concerns    Cardiac Risk Stratification High   <5 METs on         6 Minute Walk:  6 Minute Walk     Row Name 05/28/23 1530         6 Minute Walk   Phase Initial     Distance 1385 feet     Walk Time 6 minutes     # of Rest Breaks 0     MPH 2.62     METS 2.93     RPE 8     Perceived Dyspnea  0     VO2 Peak 10.26     Symptoms No     Resting HR 64 bpm     Resting BP 112/68     Resting Oxygen Saturation  98 %     Exercise Oxygen Saturation  during 6 min walk 98 %     Max Ex. HR 121 bpm     Max Ex. BP 118/62     2 Minute Post BP 116/70        Oxygen Initial Assessment:   Oxygen Re-Evaluation:   Oxygen Discharge (Final Oxygen Re-Evaluation):   Initial Exercise Prescription:  Initial Exercise Prescription - 05/28/23 1500       Date of Initial Exercise RX and Referring Provider   Date 05/28/23    Referring Provider Jules Oar, MD    Expected Discharge Date 08/20/23      Recumbant Bike   Level 1    RPM 55     Watts 20    Minutes 15    METs 2      Track   Laps 15    Minutes 15  METs 2      Prescription Details   Frequency (times per week) 3    Duration Progress to 30 minutes of continuous aerobic without signs/symptoms of physical distress      Intensity   THRR 40-80% of Max Heartrate 61-122    Ratings of Perceived Exertion 11-13    Perceived Dyspnea 0-4      Progression   Progression Continue progressive overload as per policy without signs/symptoms or physical distress.      Resistance Training   Training Prescription Yes    Weight 2    Reps 10-15          Perform Capillary Blood Glucose checks as needed.  Exercise Prescription Changes:   Exercise Prescription Changes     Row Name 06/11/23 1400             Response to Exercise   Blood Pressure (Admit) 112/62       Blood Pressure (Exercise) 122/62       Blood Pressure (Exit) 112/72       Heart Rate (Admit) 70 bpm       Heart Rate (Exercise) 104 bpm       Heart Rate (Exit) 81 bpm       Rating of Perceived Exertion (Exercise) 10       Symptoms None       Comments Pt's first day in the CRP2 program       Duration Continue with 30 min of aerobic exercise without signs/symptoms of physical distress.       Intensity THRR unchanged         Progression   Progression Continue to progress workloads to maintain intensity without signs/symptoms of physical distress.       Average METs 2.4         Resistance Training   Training Prescription No       Weight No weights on wednesdays         Interval Training   Interval Training No         Recumbant Bike   Level 1       RPM 68       Watts 14       Minutes 15       METs 2         Track   Laps 14       Minutes 15       METs 2.79          Exercise Comments:   Exercise Comments     Row Name 06/11/23 1420           Exercise Comments Pt's first day in the CRP2 program. Pt exercised without complaints.          Exercise Goals and Review:   Exercise  Goals     Row Name 05/28/23 1410             Exercise Goals   Increase Physical Activity Yes       Intervention Provide advice, education, support and counseling about physical activity/exercise needs.;Develop an individualized exercise prescription for aerobic and resistive training based on initial evaluation findings, risk stratification, comorbidities and participant's personal goals.       Expected Outcomes Short Term: Attend rehab on a regular basis to increase amount of physical activity.;Long Term: Exercising regularly at least 3-5 days a week.;Long Term: Add in home exercise to make exercise part of routine and to increase amount of physical activity.  Increase Strength and Stamina Yes       Intervention Provide advice, education, support and counseling about physical activity/exercise needs.;Develop an individualized exercise prescription for aerobic and resistive training based on initial evaluation findings, risk stratification, comorbidities and participant's personal goals.       Expected Outcomes Short Term: Increase workloads from initial exercise prescription for resistance, speed, and METs.;Short Term: Perform resistance training exercises routinely during rehab and add in resistance training at home;Long Term: Improve cardiorespiratory fitness, muscular endurance and strength as measured by increased METs and functional capacity ( )       Able to understand and use rate of perceived exertion (RPE) scale Yes       Intervention Provide education and explanation on how to use RPE scale       Expected Outcomes Short Term: Able to use RPE daily in rehab to express subjective intensity level;Long Term:  Able to use RPE to guide intensity level when exercising independently       Knowledge and understanding of Target Heart Rate Range (THRR) Yes       Intervention Provide education and explanation of THRR including how the numbers were predicted and where they are located for  reference       Expected Outcomes Short Term: Able to state/look up THRR;Short Term: Able to use daily as guideline for intensity in rehab;Long Term: Able to use THRR to govern intensity when exercising independently       Understanding of Exercise Prescription Yes       Intervention Provide education, explanation, and written materials on patient's individual exercise prescription       Expected Outcomes Short Term: Able to explain program exercise prescription;Long Term: Able to explain home exercise prescription to exercise independently          Exercise Goals Re-Evaluation :  Exercise Goals Re-Evaluation     Row Name 06/11/23 1419             Exercise Goal Re-Evaluation   Exercise Goals Review Increase Physical Activity;Understanding of Exercise Prescription;Increase Strength and Stamina;Knowledge and understanding of Target Heart Rate Range (THRR);Able to understand and use rate of perceived exertion (RPE) scale       Comments Pt's first day in the CRP2 program. Pt understands the exercise Rx, THRR and RPE scale.       Expected Outcomes Will continue to monitor patient and progress exercise workloads as tolerated.          Discharge Exercise Prescription (Final Exercise Prescription Changes):  Exercise Prescription Changes - 06/11/23 1400       Response to Exercise   Blood Pressure (Admit) 112/62    Blood Pressure (Exercise) 122/62    Blood Pressure (Exit) 112/72    Heart Rate (Admit) 70 bpm    Heart Rate (Exercise) 104 bpm    Heart Rate (Exit) 81 bpm    Rating of Perceived Exertion (Exercise) 10    Symptoms None    Comments Pt's first day in the CRP2 program    Duration Continue with 30 min of aerobic exercise without signs/symptoms of physical distress.    Intensity THRR unchanged      Progression   Progression Continue to progress workloads to maintain intensity without signs/symptoms of physical distress.    Average METs 2.4      Resistance Training   Training  Prescription No    Weight No weights on wednesdays      Interval Training   Interval Training No  Recumbant Bike   Level 1    RPM 68    Watts 14    Minutes 15    METs 2      Track   Laps 14    Minutes 15    METs 2.79          Nutrition:  Target Goals: Understanding of nutrition guidelines, daily intake of sodium 1500mg , cholesterol 200mg , calories 30% from fat and 7% or less from saturated fats, daily to have 5 or more servings of fruits and vegetables.  Biometrics:  Pre Biometrics - 05/28/23 1404       Pre Biometrics   Waist Circumference 44 inches    Hip Circumference 47 inches    Waist to Hip Ratio 0.94 %    Triceps Skinfold 37 mm    % Body Fat 46.9 %    Grip Strength 10 kg    Flexibility 11 in    Single Leg Stand 5.73 seconds           Nutrition Therapy Plan and Nutrition Goals:  Nutrition Therapy & Goals - 06/05/23 1133       Nutrition Therapy   Diet Heart Healthy Diet      Personal Nutrition Goals   Nutrition Goal Patient to identify strategies for reducing cardiovascular risk by attending the [employer] education and nutrition series weekly.    Personal Goal #2 Patient to improve diet quality by using the plate method as a guide for meal planning to include lean protein/plant protein, fruits, vegetables, whole grains, nonfat dairy as part of a well-balanced diet.    Comments Natalie Baker has medical history of HTN, non hodgkins lymphoma, mantle cell lymphoma diagnosed in 2020 and completed treatment 2023, systolic HF, HTN, hyperlipidemia, preDM. She was referred to GI for unwanted weight loss and persistent abdominal pain at 05/01/23 oncology follow-up. A1c remains in a prediabetic range. LDL is not at goal. Patient will benefit from participation in intensive cardiac rehab for nutrition, exercise, and lifestyle modification.      Intervention Plan   Intervention Prescribe, educate and counsel regarding individualized specific dietary modifications aiming  towards targeted core components such as weight, hypertension, lipid management, diabetes, heart failure and other comorbidities.;Nutrition handout(s) given to patient.    Expected Outcomes Short Term Goal: Understand basic principles of dietary content, such as calories, fat, sodium, cholesterol and nutrients.;Long Term Goal: Adherence to prescribed nutrition plan.          Nutrition Assessments:  MEDIFICTS Score Key: >=70 Need to make dietary changes  40-70 Heart Healthy Diet <= 40 Therapeutic Level Cholesterol Diet    Picture Your Plate Scores: <16 Unhealthy dietary pattern with much room for improvement. 41-50 Dietary pattern unlikely to meet recommendations for good health and room for improvement. 51-60 More healthful dietary pattern, with some room for improvement.  >60 Healthy dietary pattern, although there may be some specific behaviors that could be improved.    Nutrition Goals Re-Evaluation:  Nutrition Goals Re-Evaluation     Row Name 06/05/23 1133             Goals   Current Weight 180 lb 1.9 oz (81.7 kg)       Comment A1c 5.8, LDL 165, cholesterol 254       Expected Outcome Natalie Baker has medical history of HTN, non hodgkins lymphoma, mantle cell lymphoma diagnosed in 2020 and completed treatment 2023, systolic HF, HTN, hyperlipidemia, preDM. She was referred to GI for unwanted weight loss and persistent abdominal pain  at 05/01/23 oncology follow-up. A1c remains in a prediabetic range. LDL is not at goal. Patient will benefit from participation in intensive cardiac rehab for nutrition, exercise, and lifestyle modification.          Nutrition Goals Re-Evaluation:  Nutrition Goals Re-Evaluation     Row Name 06/05/23 1133             Goals   Current Weight 180 lb 1.9 oz (81.7 kg)       Comment A1c 5.8, LDL 165, cholesterol 254       Expected Outcome Natalie Baker has medical history of HTN, non hodgkins lymphoma, mantle cell lymphoma diagnosed in 2020 and completed  treatment 2023, systolic HF, HTN, hyperlipidemia, preDM. She was referred to GI for unwanted weight loss and persistent abdominal pain at 05/01/23 oncology follow-up. A1c remains in a prediabetic range. LDL is not at goal. Patient will benefit from participation in intensive cardiac rehab for nutrition, exercise, and lifestyle modification.          Nutrition Goals Discharge (Final Nutrition Goals Re-Evaluation):  Nutrition Goals Re-Evaluation - 06/05/23 1133       Goals   Current Weight 180 lb 1.9 oz (81.7 kg)    Comment A1c 5.8, LDL 165, cholesterol 254    Expected Outcome Natalie Baker has medical history of HTN, non hodgkins lymphoma, mantle cell lymphoma diagnosed in 2020 and completed treatment 2023, systolic HF, HTN, hyperlipidemia, preDM. She was referred to GI for unwanted weight loss and persistent abdominal pain at 05/01/23 oncology follow-up. A1c remains in a prediabetic range. LDL is not at goal. Patient will benefit from participation in intensive cardiac rehab for nutrition, exercise, and lifestyle modification.          Psychosocial: Target Goals: Acknowledge presence or absence of significant depression and/or stress, maximize coping skills, provide positive support system. Participant is able to verbalize types and ability to use techniques and skills needed for reducing stress and depression.  Initial Review & Psychosocial Screening:  Initial Psych Review & Screening - 05/28/23 1410       Initial Review   Current issues with None Identified      Family Dynamics   Good Support System? Yes   husband     Barriers   Psychosocial barriers to participate in program There are no identifiable barriers or psychosocial needs.      Screening Interventions   Interventions Encouraged to exercise;Provide feedback about the scores to participant    Expected Outcomes Short Term goal: Identification and review with participant of any Quality of Life or Depression concerns found by scoring  the questionnaire.;Long Term goal: The participant improves quality of Life and PHQ9 Scores as seen by post scores and/or verbalization of changes          Quality of Life Scores:  Quality of Life - 05/28/23 1533       Quality of Life   Select Quality of Life      Quality of Life Scores   Health/Function Pre 23.14 %    Socioeconomic Pre 30 %    Psych/Spiritual Pre 28.14 %    Family Pre 30 %    GLOBAL Pre 26.48 %         Scores of 19 and below usually indicate a poorer quality of life in these areas.  A difference of  2-3 points is a clinically meaningful difference.  A difference of 2-3 points in the total score of the Quality of Life Index has been associated  with significant improvement in overall quality of life, self-image, physical symptoms, and general health in studies assessing change in quality of life.  PHQ-9: Review Flowsheet  More data exists      05/28/2023 04/07/2023 01/09/2023 12/12/2020 08/13/2018  Depression screen PHQ 2/9  Decreased Interest 0 0 0 0 0  Down, Depressed, Hopeless 0 0 0 1 0  PHQ - 2 Score 0 0 0 1 0  Altered sleeping 1 1 - - -  Tired, decreased energy 1 3 - - -  Change in appetite 0 2 - - -  Feeling bad or failure about yourself  0 0 - - -  Trouble concentrating 0 0 - - -  Moving slowly or fidgety/restless 0 1 - - -  Suicidal thoughts 0 0 - - -  PHQ-9 Score 2 7 - - -  Difficult doing work/chores Not difficult at all Somewhat difficult - - -   Interpretation of Total Score  Total Score Depression Severity:  1-4 = Minimal depression, 5-9 = Mild depression, 10-14 = Moderate depression, 15-19 = Moderately severe depression, 20-27 = Severe depression   Psychosocial Evaluation and Intervention:   Psychosocial Re-Evaluation:  Psychosocial Re-Evaluation     Row Name 06/11/23 1441 06/24/23 1742           Psychosocial Re-Evaluation   Current issues with None Identified Current Stress Concerns      Comments -- Natalie Baker says she has been having  GI issues. Natalie Baker has recently followed up with GI      Expected Outcomes -- Natalie Baker will have controlled or decreased stressors upon completion of cardiac rehab      Interventions Encouraged to attend Cardiac Rehabilitation for the exercise Encouraged to attend Cardiac Rehabilitation for the exercise      Continue Psychosocial Services  No Follow up required No Follow up required        Initial Review   Source of Stress Concerns -- Chronic Illness      Comments -- Will continue to monitor and offer support as needed         Psychosocial Discharge (Final Psychosocial Re-Evaluation):  Psychosocial Re-Evaluation - 06/24/23 1742       Psychosocial Re-Evaluation   Current issues with Current Stress Concerns    Comments Natalie Baker says she has been having GI issues. Natalie Baker has recently followed up with GI    Expected Outcomes Natalie Baker will have controlled or decreased stressors upon completion of cardiac rehab    Interventions Encouraged to attend Cardiac Rehabilitation for the exercise    Continue Psychosocial Services  No Follow up required      Initial Review   Source of Stress Concerns Chronic Illness    Comments Will continue to monitor and offer support as needed          Vocational Rehabilitation: Provide vocational rehab assistance to qualifying candidates.   Vocational Rehab Evaluation & Intervention:  Vocational Rehab - 05/28/23 1410       Initial Vocational Rehab Evaluation & Intervention   Assessment shows need for Vocational Rehabilitation No   retired         Education: Education Goals: Education classes will be provided on a weekly basis, covering required topics. Participant will state understanding/return demonstration of topics presented.    Education     Row Name 06/04/23 1600     Education   Cardiac Education Topics [employer]   Customer service manager  Weekly Topic Delicious Desserts   Instruction Review Code 1-  Verbalizes Understanding   Class Start Time 1400   Class Stop Time 1437   Class Time Calculation (min) 37 min    Row Name 06/11/23 1500     Education   Cardiac Education Topics [employer]   Customer service manager   Weekly Topic Efficiency Cooking - Meals in a Snap   Instruction Review Code 1- Verbalizes Understanding   Class Start Time 1400   Class Stop Time 1442   Class Time Calculation (min) 42 min    Row Name 06/16/23 1300     Education   Cardiac Education Topics [employer]   Hospital doctor Education   General Education Hypertension and Heart Disease   Instruction Review Code 1- Verbalizes Understanding   Class Start Time 1355   Class Stop Time 1443   Class Time Calculation (min) 48 min    Row Name 06/18/23 1500     Education   Cardiac Education Topics [employer]   Customer service manager   Weekly Topic One-Pot Wonders   Instruction Review Code 1- Verbalizes Understanding   Class Start Time 1400   Class Stop Time 1445   Class Time Calculation (min) 45 min      Core Videos: Exercise    Move It!  Clinical staff conducted group or individual video education with verbal and written material and guidebook.  Patient learns the recommended [employer]in exercise program. Exercise with the goal of living a long, healthy life. Some of the health benefits of exercise include controlled diabetes, healthier blood pressure levels, improved cholesterol levels, improved heart and lung capacity, improved sleep, and better body composition. Everyone should speak with their doctor before starting or changing an exercise routine.  Biomechanical Limitations Clinical staff conducted group or individual video education with verbal and written material and guidebook.  Patient learns how biomechanical limitations can impact exercise and how we  can mitigate and possibly overcome limitations to have an impactful and balanced exercise routine.  Body Composition Clinical staff conducted group or individual video education with verbal and written material and guidebook.  Patient learns that body composition (ratio of muscle mass to fat mass) is a key component to assessing overall fitness, rather than body weight alone. Increased fat mass, especially visceral belly fat, can put us  at increased risk for metabolic syndrome, type 2 diabetes, heart disease, and even death. It is recommended to combine diet and exercise (cardiovascular and resistance training) to improve your body composition. Seek guidance from your physician and exercise physiologist before implementing an exercise routine.  Exercise Action Plan Clinical staff conducted group or individual video education with verbal and written material and guidebook.  Patient learns the recommended strategies to achieve and enjoy long-term exercise adherence, including variety, self-motivation, self-efficacy, and positive decision making. Benefits of exercise include fitness, good health, weight management, more energy, better sleep, less stress, and overall well-being.  Medical   Heart Disease Risk Reduction Clinical staff conducted group or individual video education with verbal and written material and guidebook.  Patient learns our heart is our most vital organ as it circulates oxygen, nutrients, white blood cells, and hormones throughout the entire body, and carries waste away. Data supports a plant-based eating plan like the Pritikin Program for its effectiveness  in slowing progression of and reversing heart disease. The video provides a number of recommendations to address heart disease.   Metabolic Syndrome and Belly Fat  Clinical staff conducted group or individual video education with verbal and written material and guidebook.  Patient learns what metabolic syndrome is, how it leads  to heart disease, and how one can reverse it and keep it from coming back. You have metabolic syndrome if you have 3 of the following 5 criteria: abdominal obesity, high blood pressure, high triglycerides, low HDL cholesterol, and high blood sugar.  Hypertension and Heart Disease Clinical staff conducted group or individual video education with verbal and written material and guidebook.  Patient learns that high blood pressure, or hypertension, is very common in the [place] . Hypertension is largely due to excessive salt intake, but other important risk factors include being overweight, physical inactivity, drinking too much alcohol, smoking, and not eating enough potassium from fruits and vegetables. High blood pressure is a leading risk factor for heart attack, stroke, congestive heart failure, dementia, kidney failure, and premature death. Long-term effects of excessive salt intake include stiffening of the arteries and thickening of heart muscle and organ damage. Recommendations include ways to reduce hypertension and the risk of heart disease.  Diseases of Our Time - Focusing on Diabetes Clinical staff conducted group or individual video education with verbal and written material and guidebook.  Patient learns why the best way to stop diseases of our time is prevention, through food and other lifestyle changes. Medicine (such as prescription pills and surgeries) is often only a Band-Aid on the problem, not a long-term solution. Most common diseases of our time include obesity, type 2 diabetes, hypertension, heart disease, and cancer. The Pritikin Program is recommended and has been proven to help reduce, reverse, and/or prevent the damaging effects of metabolic syndrome.  Nutrition   Overview of the Pritikin Eating Plan  Clinical staff conducted group or individual video education with verbal and written material and guidebook.  Patient learns about the Pritikin Eating Plan for disease risk  reduction. The Pritikin Eating Plan emphasizes a wide variety of unrefined, minimally-processed carbohydrates, like fruits, vegetables, whole grains, and legumes. Go, Caution, and Stop food choices are explained. Plant-based and lean animal proteins are emphasized. Rationale provided for low sodium intake for blood pressure control, low added sugars for blood sugar stabilization, and low added fats and oils for coronary artery disease risk reduction and weight management.  Calorie Density  Clinical staff conducted group or individual video education with verbal and written material and guidebook.  Patient learns about calorie density and how it impacts the Pritikin Eating Plan. Knowing the characteristics of the food you choose will help you decide whether those foods will lead to weight gain or weight loss, and whether you want to consume more or less of them. Weight loss is usually a side effect of the Pritikin Eating Plan because of its focus on low calorie-dense foods.  Label Reading  Clinical staff conducted group or individual video education with verbal and written material and guidebook.  Patient learns about the Pritikin recommended label reading guidelines and corresponding recommendations regarding calorie density, added sugars, sodium content, and whole grains.  Dining Out - Part 1  Clinical staff conducted group or individual video education with verbal and written material and guidebook.  Patient learns that restaurant meals can be sabotaging because they can be so high in calories, fat, sodium, and/or sugar. Patient learns recommended strategies on how to  positively address this and avoid unhealthy pitfalls.  Facts on Fats  Clinical staff conducted group or individual video education with verbal and written material and guidebook.  Patient learns that lifestyle modifications can be just as effective, if not more so, as many medications for lowering your risk of heart disease. A  Pritikin lifestyle can help to reduce your risk of inflammation and atherosclerosis (cholesterol build-up, or plaque, in the artery walls). Lifestyle interventions such as dietary choices and physical activity address the cause of atherosclerosis. A review of the types of fats and their impact on blood cholesterol levels, along with dietary recommendations to reduce fat intake is also included.  Nutrition Action Plan  Clinical staff conducted group or individual video education with verbal and written material and guidebook.  Patient learns how to incorporate Pritikin recommendations into their lifestyle. Recommendations include planning and keeping personal health goals in mind as an important part of their success.  Healthy Mind-Set    Healthy Minds, Bodies, Hearts  Clinical staff conducted group or individual video education with verbal and written material and guidebook.  Patient learns how to identify when they are stressed. Video will discuss the impact of that stress, as well as the many benefits of stress management. Patient will also be introduced to stress management techniques. The way we think, act, and feel has an impact on our hearts.  How Our Thoughts Can Heal Our Hearts  Clinical staff conducted group or individual video education with verbal and written material and guidebook.  Patient learns that negative thoughts can cause depression and anxiety. This can result in negative lifestyle behavior and serious health problems. Cognitive behavioral therapy is an effective method to help control our thoughts in order to change and improve our emotional outlook.  Additional Videos:  Exercise    Improving Performance  Clinical staff conducted group or individual video education with verbal and written material and guidebook.  Patient learns to use a non-linear approach by alternating intensity levels and lengths of time spent exercising to help burn more calories and lose more body fat.  Cardiovascular exercise helps improve heart health, metabolism, hormonal balance, blood sugar control, and recovery from fatigue. Resistance training improves strength, endurance, balance, coordination, reaction time, metabolism, and muscle mass. Flexibility exercise improves circulation, posture, and balance. Seek guidance from your physician and exercise physiologist before implementing an exercise routine and learn your capabilities and proper form for all exercise.  Introduction to Yoga  Clinical staff conducted group or individual video education with verbal and written material and guidebook.  Patient learns about yoga, a discipline of the coming together of mind, breath, and body. The benefits of yoga include improved flexibility, improved range of motion, better posture and core strength, increased lung function, weight loss, and positive self-image. Yoga's heart health benefits include lowered blood pressure, healthier heart rate, decreased cholesterol and triglyceride levels, improved immune function, and reduced stress. Seek guidance from your physician and exercise physiologist before implementing an exercise routine and learn your capabilities and proper form for all exercise.  Medical   Aging: Enhancing Your Quality of Life  Clinical staff conducted group or individual video education with verbal and written material and guidebook.  Patient learns key strategies and recommendations to stay in good physical health and enhance quality of life, such as prevention strategies, having an advocate, securing a Health Care Proxy and Power of Attorney, and keeping a list of medications and system for tracking them. It also discusses how to avoid risk  for bone loss.  Biology of Weight Control  Clinical staff conducted group or individual video education with verbal and written material and guidebook.  Patient learns that weight gain occurs because we consume more calories than we burn (eating more,  moving less). Even if your body weight is normal, you may have higher ratios of fat compared to muscle mass. Too much body fat puts you at increased risk for cardiovascular disease, heart attack, stroke, type 2 diabetes, and obesity-related cancers. In addition to exercise, following the Pritikin Eating Plan can help reduce your risk.  Decoding Lab Results  Clinical staff conducted group or individual video education with verbal and written material and guidebook.  Patient learns that lab test reflects one measurement whose values change over time and are influenced by many factors, including medication, stress, sleep, exercise, food, hydration, pre-existing medical conditions, and more. It is recommended to use the knowledge from this video to become more involved with your lab results and evaluate your numbers to speak with your doctor.   Diseases of Our Time - Overview  Clinical staff conducted group or individual video education with verbal and written material and guidebook.  Patient learns that according to the CDC, 50% to 70% of chronic diseases (such as obesity, type 2 diabetes, elevated lipids, hypertension, and heart disease) are avoidable through lifestyle improvements including healthier food choices, listening to satiety cues, and increased physical activity.  Sleep Disorders Clinical staff conducted group or individual video education with verbal and written material and guidebook.  Patient learns how good quality and duration of sleep are important to overall health and well-being. Patient also learns about sleep disorders and how they impact health along with recommendations to address them, including discussing with a physician.  Nutrition  Dining Out - Part 2 Clinical staff conducted group or individual video education with verbal and written material and guidebook.  Patient learns how to plan ahead and communicate in order to maximize their dining experience in a healthy and  nutritious manner. Included are recommended food choices based on the type of restaurant the patient is visiting.   Fueling a Banker conducted group or individual video education with verbal and written material and guidebook.  There is a strong connection between our food choices and our health. Diseases like obesity and type 2 diabetes are very prevalent and are in large-part due to lifestyle choices. The Pritikin Eating Plan provides plenty of food and hunger-curbing satisfaction. It is easy to follow, affordable, and helps reduce health risks.  Menu Workshop  Clinical staff conducted group or individual video education with verbal and written material and guidebook.  Patient learns that restaurant meals can sabotage health goals because they are often packed with calories, fat, sodium, and sugar. Recommendations include strategies to plan ahead and to communicate with the manager, chef, or server to help order a healthier meal.  Planning Your Eating Strategy  Clinical staff conducted group or individual video education with verbal and written material and guidebook.  Patient learns about the Pritikin Eating Plan and its benefit of reducing the risk of disease. The Pritikin Eating Plan does not focus on calories. Instead, it emphasizes high-quality, nutrient-rich foods. By knowing the characteristics of the foods, we choose, we can determine their calorie density and make informed decisions.  Targeting Your Nutrition Priorities  Clinical staff conducted group or individual video education with verbal and written material and guidebook.  Patient learns that lifestyle habits have a tremendous impact  on disease risk and progression. This video provides eating and physical activity recommendations based on your personal health goals, such as reducing LDL cholesterol, losing weight, preventing or controlling type 2 diabetes, and reducing high blood pressure.  Vitamins and  Minerals  Clinical staff conducted group or individual video education with verbal and written material and guidebook.  Patient learns different ways to obtain key vitamins and minerals, including through a recommended healthy diet. It is important to discuss all supplements you take with your doctor.   Healthy Mind-Set    Smoking Cessation  Clinical staff conducted group or individual video education with verbal and written material and guidebook.  Patient learns that cigarette smoking and tobacco addiction pose a serious health risk which affects millions of people. Stopping smoking will significantly reduce the risk of heart disease, lung disease, and many forms of cancer. Recommended strategies for quitting are covered, including working with your doctor to develop a successful plan.  Culinary   Becoming a Set designer conducted group or individual video education with verbal and written material and guidebook.  Patient learns that cooking at home can be healthy, cost-effective, quick, and puts them in control. Keys to cooking healthy recipes will include looking at your recipe, assessing your equipment needs, planning ahead, making it simple, choosing cost-effective seasonal ingredients, and limiting the use of added fats, salts, and sugars.  Cooking - Breakfast and Snacks  Clinical staff conducted group or individual video education with verbal and written material and guidebook.  Patient learns how important breakfast is to satiety and nutrition through the entire day. Recommendations include key foods to eat during breakfast to help stabilize blood sugar levels and to prevent overeating at meals later in the day. Planning ahead is also a key component.  Cooking - Educational psychologist conducted group or individual video education with verbal and written material and guidebook.  Patient learns eating strategies to improve overall health, including an approach  to cook more at home. Recommendations include thinking of animal protein as a side on your plate rather than center stage and focusing instead on lower calorie dense options like vegetables, fruits, whole grains, and plant-based proteins, such as beans. Making sauces in large quantities to freeze for later and leaving the skin on your vegetables are also recommended to maximize your experience.  Cooking - Healthy Salads and Dressing Clinical staff conducted group or individual video education with verbal and written material and guidebook.  Patient learns that vegetables, fruits, whole grains, and legumes are the foundations of the Pritikin Eating Plan. Recommendations include how to incorporate each of these in flavorful and healthy salads, and how to create homemade salad dressings. Proper handling of ingredients is also covered. Cooking - Soups and [employer] - Soups and Desserts Clinical staff conducted group or individual video education with verbal and written material and guidebook.  Patient learns that Pritikin soups and desserts make for easy, nutritious, and delicious snacks and meal components that are low in sodium, fat, sugar, and calorie density, while high in vitamins, minerals, and filling fiber. Recommendations include simple and healthy ideas for soups and desserts.   Overview     The Pritikin Solution Program Overview Clinical staff conducted group or individual video education with verbal and written material and guidebook.  Patient learns that the results of the Pritikin Program have been documented in more than 100 articles published in peer-reviewed journals, and the benefits include reducing risk factors  for (and, in some cases, even reversing) high cholesterol, high blood pressure, type 2 diabetes, obesity, and more! An overview of the three key pillars of the Pritikin Program will be covered: eating well, doing regular exercise, and having a healthy  mind-set.  WORKSHOPS  Exercise: Exercise Basics: Building Your Action Plan Clinical staff led group instruction and group discussion with PowerPoint presentation and patient guidebook. To enhance the learning environment the use of posters, models and videos may be added. At the conclusion of this workshop, patients will comprehend the difference between physical activity and exercise, as well as the benefits of incorporating both, into their routine. Patients will understand the FITT (Frequency, Intensity, Time, and Type) principle and how to use it to build an exercise action plan. In addition, safety concerns and other considerations for exercise and cardiac rehab will be addressed by the presenter. The purpose of this lesson is to promote a comprehensive and effective weekly exercise routine in order to improve patients' overall level of fitness.   Managing Heart Disease: Your Path to a Healthier Heart Clinical staff led group instruction and group discussion with PowerPoint presentation and patient guidebook. To enhance the learning environment the use of posters, models and videos may be added.At the conclusion of this workshop, patients will understand the anatomy and physiology of the heart. Additionally, they will understand how Pritikin's three pillars impact the risk factors, the progression, and the management of heart disease.  The purpose of this lesson is to provide a high-level overview of the heart, heart disease, and how the Pritikin lifestyle positively impacts risk factors.  Exercise Biomechanics Clinical staff led group instruction and group discussion with PowerPoint presentation and patient guidebook. To enhance the learning environment the use of posters, models and videos may be added. Patients will learn how the structural parts of their bodies function and how these functions impact their daily activities, movement, and exercise. Patients will learn how to promote a  neutral spine, learn how to manage pain, and identify ways to improve their physical movement in order to promote healthy living. The purpose of this lesson is to expose patients to common physical limitations that impact physical activity. Participants will learn practical ways to adapt and manage aches and pains, and to minimize their effect on regular exercise. Patients will learn how to maintain good posture while sitting, walking, and lifting.  Balance Training and Fall Prevention  Clinical staff led group instruction and group discussion with PowerPoint presentation and patient guidebook. To enhance the learning environment the use of posters, models and videos may be added. At the conclusion of this workshop, patients will understand the importance of their sensorimotor skills (vision, proprioception, and the vestibular system) in maintaining their ability to balance as they age. Patients will apply a variety of balancing exercises that are appropriate for their current level of function. Patients will understand the common causes for poor balance, possible solutions to these problems, and ways to modify their physical environment in order to minimize their fall risk. The purpose of this lesson is to teach patients about the importance of maintaining balance as they age and ways to minimize their risk of falling.  WORKSHOPS   Nutrition:  Fueling a Ship broker led group instruction and group discussion with PowerPoint presentation and patient guidebook. To enhance the learning environment the use of posters, models and videos may be added. Patients will review the foundational principles of the Pritikin Eating Plan and understand what constitutes a serving  size in each of the food groups. Patients will also learn Pritikin-friendly foods that are better choices when away from home and review make-ahead meal and snack options. Calorie density will be reviewed and applied to  three nutrition priorities: weight maintenance, weight loss, and weight gain. The purpose of this lesson is to reinforce (in a group setting) the key concepts around what patients are recommended to eat and how to apply these guidelines when away from home by planning and selecting Pritikin-friendly options. Patients will understand how calorie density may be adjusted for different weight management goals.  Mindful Eating  Clinical staff led group instruction and group discussion with PowerPoint presentation and patient guidebook. To enhance the learning environment the use of posters, models and videos may be added. Patients will briefly review the concepts of the Pritikin Eating Plan and the importance of low-calorie dense foods. The concept of mindful eating will be introduced as well as the importance of paying attention to internal hunger signals. Triggers for non-hunger eating and techniques for dealing with triggers will be explored. The purpose of this lesson is to provide patients with the opportunity to review the basic principles of the Pritikin Eating Plan, discuss the value of eating mindfully and how to measure internal cues of hunger and fullness using the Hunger Scale. Patients will also discuss reasons for non-hunger eating and learn strategies to use for controlling emotional eating.  Targeting Your Nutrition Priorities Clinical staff led group instruction and group discussion with PowerPoint presentation and patient guidebook. To enhance the learning environment the use of posters, models and videos may be added. Patients will learn how to determine their genetic susceptibility to disease by reviewing their family history. Patients will gain insight into the importance of diet as part of an overall healthy lifestyle in mitigating the impact of genetics and other environmental insults. The purpose of this lesson is to provide patients with the opportunity to assess their personal nutrition  priorities by looking at their family history, their own health history and current risk factors. Patients will also be able to discuss ways of prioritizing and modifying the Pritikin Eating Plan for their highest risk areas  Menu  Clinical staff led group instruction and group discussion with PowerPoint presentation and patient guidebook. To enhance the learning environment the use of posters, models and videos may be added. Using menus brought in from [employer], or printed from [employer], patients will apply the Pritikin dining out guidelines that were presented in the [employer] video. Patients will also be able to practice these guidelines in a variety of provided scenarios. The purpose of this lesson is to provide patients with the opportunity to practice hands-on learning of the Pritikin Dining Out guidelines with actual menus and practice scenarios.  Label Reading Clinical staff led group instruction and group discussion with PowerPoint presentation and patient guidebook. To enhance the learning environment the use of posters, models and videos may be added. Patients will review and discuss the Pritikin label reading guidelines presented in Pritikin's Label Reading Educational series video. Using fool labels brought in from local grocery stores and markets, patients will apply the label reading guidelines and determine if the packaged food meet the Pritikin guidelines. The purpose of this lesson is to provide patients with the opportunity to review, discuss, and practice hands-on learning of the Pritikin Label Reading guidelines with actual packaged food labels. Cooking School  Pritikin's [employer] are designed to teach patients ways to prepare  quick, simple, and affordable recipes at home. The importance of nutrition's role in chronic disease risk reduction is reflected in its emphasis in the overall Pritikin program. By learning how to prepare essential  core Pritikin Eating Plan recipes, patients will increase control over what they eat; be able to customize the flavor of foods without the use of added salt, sugar, or fat; and improve the quality of the food they consume. By learning a set of core recipes which are easily assembled, quickly prepared, and affordable, patients are more likely to prepare more healthy foods at home. These workshops focus on convenient breakfasts, simple entres, side dishes, and desserts which can be prepared with minimal effort and are consistent with nutrition recommendations for cardiovascular risk reduction. Cooking [employer] are taught by a Armed forces logistics/support/administrative officer (RD) who has been trained by the [employer]. The chef or RD has a clear understanding of the importance of minimizing - if not completely eliminating - added fat, sugar, and sodium in recipes. Throughout the series of Cooking School Workshop sessions, patients will learn about healthy ingredients and efficient methods of cooking to build confidence in their capability to prepare    Cooking School weekly topics:  Adding Flavor- Sodium-Free  Fast and Healthy Breakfasts  Powerhouse Plant-Based Proteins  Satisfying Salads and Dressings  Simple Sides and Sauces  International Cuisine-Spotlight on the [employer] Zones  Delicious Desserts  Savory Soups  [employer] - Meals in a Astronomer Appetizers and Snacks  Comforting Weekend Breakfasts  One-Pot Wonders   Fast Evening Meals  Landscape architect Your Pritikin Plate  WORKSHOPS   Healthy Mindset (Psychosocial):  Focused Goals, Sustainable Changes Clinical staff led group instruction and group discussion with PowerPoint presentation and patient guidebook. To enhance the learning environment the use of posters, models and videos may be added. Patients will be able to apply effective goal setting strategies to establish at least one personal goal, and then take  consistent, meaningful action toward that goal. They will learn to identify common barriers to achieving personal goals and develop strategies to overcome them. Patients will also gain an understanding of how our mind-set can impact our ability to achieve goals and the importance of cultivating a positive and growth-oriented mind-set. The purpose of this lesson is to provide patients with a deeper understanding of how to set and achieve personal goals, as well as the tools and strategies needed to overcome common obstacles which may arise along the way.  From Head to Heart: The Power of a Healthy Outlook  Clinical staff led group instruction and group discussion with PowerPoint presentation and patient guidebook. To enhance the learning environment the use of posters, models and videos may be added. Patients will be able to recognize and describe the impact of emotions and mood on physical health. They will discover the importance of self-care and explore self-care practices which may work for them. Patients will also learn how to utilize the 4 C's to cultivate a healthier outlook and better manage stress and challenges. The purpose of this lesson is to demonstrate to patients how a healthy outlook is an essential part of maintaining good health, especially as they continue their cardiac rehab journey.  Healthy Sleep for a Healthy Heart Clinical staff led group instruction and group discussion with PowerPoint presentation and patient guidebook. To enhance the learning environment the use of posters, models and videos may be added. At the conclusion of this workshop, patients will be  able to demonstrate knowledge of the importance of sleep to overall health, well-being, and quality of life. They will understand the symptoms of, and treatments for, common sleep disorders. Patients will also be able to identify daytime and nighttime behaviors which impact sleep, and they will be able to apply these tools to help  manage sleep-related challenges. The purpose of this lesson is to provide patients with a general overview of sleep and outline the importance of quality sleep. Patients will learn about a few of the most common sleep disorders. Patients will also be introduced to the concept of "sleep hygiene," and discover ways to self-manage certain sleeping problems through simple daily behavior changes. Finally, the workshop will motivate patients by clarifying the links between quality sleep and their goals of heart-healthy living.   Recognizing and Reducing Stress Clinical staff led group instruction and group discussion with PowerPoint presentation and patient guidebook. To enhance the learning environment the use of posters, models and videos may be added. At the conclusion of this workshop, patients will be able to understand the types of stress reactions, differentiate between acute and chronic stress, and recognize the impact that chronic stress has on their health. They will also be able to apply different coping mechanisms, such as reframing negative self-talk. Patients will have the opportunity to practice a variety of stress management techniques, such as deep abdominal breathing, progressive muscle relaxation, and/or guided imagery.  The purpose of this lesson is to educate patients on the role of stress in their lives and to provide healthy techniques for coping with it.  Learning Barriers/Preferences:  Learning Barriers/Preferences - 05/28/23 1410       Learning Barriers/Preferences   Learning Barriers Sight   glasses   Learning Preferences Group Instruction;Individual Instruction;Skilled Demonstration;Verbal Instruction;Video;Written Material          Education Topics:  Knowledge Questionnaire Score:  Knowledge Questionnaire Score - 05/28/23 1410       Knowledge Questionnaire Score   Pre Score 23/24          Core Components/Risk Factors/Patient Goals at Admission:  Personal Goals  and Risk Factors at Admission - 05/28/23 1410       Core Components/Risk Factors/Patient Goals on Admission    Weight Management Yes;Obesity    Intervention Weight Management: Develop a combined nutrition and exercise program designed to reach desired caloric intake, while maintaining appropriate intake of nutrient and fiber, sodium and fats, and appropriate energy expenditure required for the weight goal.;Weight Management: Provide education and appropriate resources to help participant work on and attain dietary goals.;Weight Management/Obesity: Establish reasonable short term and long term weight goals.;Obesity: Provide education and appropriate resources to help participant work on and attain dietary goals.    Expected Outcomes Short Term: Continue to assess and modify interventions until short term weight is achieved;Long Term: Adherence to nutrition and physical activity/exercise program aimed toward attainment of established weight goal;Understanding recommendations for meals to include 15-35% energy as protein, 25-35% energy from fat, 35-60% energy from carbohydrates, less than 200mg  of dietary cholesterol, 20-35 gm of total fiber daily;Understanding of distribution of calorie intake throughout the day with the consumption of 4-5 meals/snacks    Heart Failure Yes    Intervention Provide a combined exercise and nutrition program that is supplemented with education, support and counseling about heart failure. Directed toward relieving symptoms such as shortness of breath, decreased exercise tolerance, and extremity edema.    Expected Outcomes Improve functional capacity of life;Short term: Attendance in program 2-3 days  a week with increased exercise capacity. Reported lower sodium intake. Reported increased fruit and vegetable intake. Reports medication compliance.;Short term: Daily weights obtained and reported for increase. Utilizing diuretic protocols set by physician.;Long term: Adoption of  self-care skills and reduction of barriers for early signs and symptoms recognition and intervention leading to self-care maintenance.    Hypertension Yes    Intervention Provide education on lifestyle modifcations including regular physical activity/exercise, weight management, moderate sodium restriction and increased consumption of fresh fruit, vegetables, and low fat dairy, alcohol moderation, and smoking cessation.;Monitor prescription use compliance.    Expected Outcomes Short Term: Continued assessment and intervention until BP is < 140/11mm HG in hypertensive participants. < 130/82mm HG in hypertensive participants with diabetes, heart failure or chronic kidney disease.;Long Term: Maintenance of blood pressure at goal levels.    Lipids Yes    Intervention Provide education and support for participant on nutrition & aerobic/resistive exercise along with prescribed medications to achieve LDL 70mg , HDL >40mg .    Expected Outcomes Short Term: Participant states understanding of desired cholesterol values and is compliant with medications prescribed. Participant is following exercise prescription and nutrition guidelines.;Long Term: Cholesterol controlled with medications as prescribed, with individualized exercise RX and with personalized nutrition plan. Value goals: LDL < 70mg , HDL > 40 mg.          Core Components/Risk Factors/Patient Goals Review:   Goals and Risk Factor Review     Row Name 06/11/23 1442 06/24/23 1745           Core Components/Risk Factors/Patient Goals Review   Personal Goals Review Weight Management/Obesity;Hypertension;Lipids;Heart Failure Weight Management/Obesity;Hypertension;Lipids;Heart Failure      Review Natalie Baker started cardiac rehab on 06/11/23. Natalie Baker did well with exercise. Vital signs were stable.Natalie Baker is back on her  corlanor . Heart rate improved today. Natalie Baker is doing well with exercise. Systolic resting BP's have been in the 90's. Asymptomatic. Will  continue to monitor. Natalie Baker has increased her met level      Expected Outcomes Natalie Baker will continue to participate in cardiac rehab for exercise, nutrition and lifestyle modifications Natalie Baker will continue to participate in cardiac rehab for exercise, nutrition and lifestyle modifications         Core Components/Risk Factors/Patient Goals at Discharge (Final Review):   Goals and Risk Factor Review - 06/24/23 1745       Core Components/Risk Factors/Patient Goals Review   Personal Goals Review Weight Management/Obesity;Hypertension;Lipids;Heart Failure    Review Natalie Baker is doing well with exercise. Systolic resting BP's have been in the 90's. Asymptomatic. Will continue to monitor. Natalie Baker has increased her met level    Expected Outcomes Natalie Baker will continue to participate in cardiac rehab for exercise, nutrition and lifestyle modifications          ITP Comments:  ITP Comments     Row Name 05/28/23 1402 06/11/23 1440 06/24/23 1741       ITP Comments Dr. Gaylyn Keas medical director. Introduction to pritikin education/intensive cardiac rehab. Initial orientation packet reviewed with patient. 30 Day Robin started cardiac rehab on 06/11/23. Heart rate was improved after restarting corlanor . Natalie Baker did well with exercise. 30 Day Natalie Baker has good  attendance and participation with exercise at cardiac rehab        Comments: See ITP comments.[name] RN BSN

## 2023-06-25 ENCOUNTER — Ambulatory Visit: Payer: Medicare Other | Admitting: Nurse Practitioner

## 2023-06-25 ENCOUNTER — Encounter (HOSPITAL_COMMUNITY): Admission: RE | Admit: 2023-06-25 | Source: Ambulatory Visit

## 2023-06-27 ENCOUNTER — Telehealth: Payer: Self-pay | Admitting: Gastroenterology

## 2023-06-27 ENCOUNTER — Encounter (HOSPITAL_COMMUNITY): Admission: RE | Admit: 2023-06-27 | Source: Ambulatory Visit

## 2023-06-27 NOTE — Telephone Encounter (Signed)
 [employer] H. pylori results received.  Negative for H. pylori 06/21/2023.

## 2023-06-27 NOTE — Telephone Encounter (Signed)
 The patient has been notified of this information and all questions answered.

## 2023-06-30 ENCOUNTER — Encounter (HOSPITAL_COMMUNITY)
Admission: RE | Admit: 2023-06-30 | Discharge: 2023-06-30 | Disposition: A | Discharge status: Home / Self Care | Source: Ambulatory Visit | Attending: Internal Medicine

## 2023-06-30 DIAGNOSIS — I5022 Chronic systolic (congestive) heart failure: Secondary | ICD-10-CM | POA: Diagnosis not present

## 2023-07-02 ENCOUNTER — Telehealth (HOSPITAL_COMMUNITY): Payer: Self-pay | Admitting: *Deleted

## 2023-07-02 ENCOUNTER — Encounter (HOSPITAL_COMMUNITY): Admission: RE | Admit: 2023-07-02 | Source: Ambulatory Visit

## 2023-07-02 NOTE — Telephone Encounter (Signed)
 Received message from [place], will not attend cardiac rehab today due to not feeling well.

## 2023-07-03 ENCOUNTER — Ambulatory Visit (HOSPITAL_COMMUNITY)
Admission: RE | Admit: 2023-07-03 | Discharge: 2023-07-03 | Disposition: A | Discharge status: Home / Self Care | Source: Ambulatory Visit | Attending: Internal Medicine | Admitting: Internal Medicine

## 2023-07-03 DIAGNOSIS — E785 Hyperlipidemia, unspecified: Secondary | ICD-10-CM | POA: Insufficient documentation

## 2023-07-03 DIAGNOSIS — I5022 Chronic systolic (congestive) heart failure: Secondary | ICD-10-CM | POA: Diagnosis present

## 2023-07-03 DIAGNOSIS — I110 Hypertensive heart disease with heart failure: Secondary | ICD-10-CM | POA: Insufficient documentation

## 2023-07-03 DIAGNOSIS — R7303 Prediabetes: Secondary | ICD-10-CM | POA: Diagnosis not present

## 2023-07-03 DIAGNOSIS — Z006 Encounter for examination for normal comparison and control in clinical research program: Secondary | ICD-10-CM

## 2023-07-03 DIAGNOSIS — I340 Nonrheumatic mitral (valve) insufficiency: Secondary | ICD-10-CM | POA: Insufficient documentation

## 2023-07-03 LAB — ECHOCARDIOGRAM COMPLETE
Area-P 1/2: 3.88 cm2
S' Lateral: 4.5 cm
Single Plane A2C EF: 42.2 %

## 2023-07-03 NOTE — Progress Notes (Signed)
  Echocardiogram 2D Echocardiogram has been performed.  Koleen KANDICE Popper, RDCS 07/03/2023, 3:57 PM

## 2023-07-04 ENCOUNTER — Encounter (HOSPITAL_COMMUNITY)

## 2023-07-04 NOTE — Research (Signed)
 SITE: [id number]     Subject # [id number]    Subprotocol: A  Inclusion Criteria  Patients who meet all of the following criteria are eligible for enrollment as study participants:  Yes No  Age > 68 years old X   Eligible to wear Holter Study X    Exclusion Criteria  Patients who meet any of these criteria are not eligible for enrollment as study participants: Yes No  1. Receiving any mechanical (respiratory or circulatory) or renal support therapy at Screening or during Visit #1.  X  2.  Any other conditions that in the opinion of the investigators are likely to prevent compliance with the study protocol or pose a safety concern if the subject participates in the study.  X  3. Poor tolerance, namely susceptible to severe skin allergies from ECG adhesive patch application.  X   Protocol: REV H    60 minute start window         Cor device must be applied, and the study initiated, no later than 60 minutes of completing the Echocardiogram                             HH:MM  Echo completion time  15:48  2.   Cor Study start time  15:57   30-Minute execution window  Once Cor Monitoring begins, 3 QT Med ECGs and the 15-minute rest period must be completed within a 30 minute window     HH:MM  3. QT Med ECG Completion time  16:04  4. Start of 15-Min sitting rest period  16:04  5. End of 15-Min rest period  16:22  6. Time of device removal  16:30   *Continue to use the Mobile App Event feature to log the Rest period windows and follow instructions on the EF-ACT Clinical Trial  Patient Instruction Card.  Describe any anomalies in Protocol execution in the Protocol Deviation Log    Residential Zip code [phone] (First 3 digits ONLY)                                           [facility] Informed Consent   Subject Name: Natalie Baker  Subject met inclusion and exclusion criteria.  The informed consent form, study requirements and expectations were reviewed with the subject. Subject had opportunity to  read consent and questions and concerns were addressed prior to the signing of the consent form.  The subject verbalized understanding of the trial requirements.  The subject agreed to participate in the [facility] EF ACT trial and signed the informed consent at 15:52 on 03-Jul-2023.  The informed consent was obtained prior to performance of any protocol-specific procedures for the subject.  A copy of the signed informed consent was given to the subject and a copy was placed in the subject's medical record.   Dorthea LITTIE Louder          Current Outpatient Medications:    amiodarone  (PACERONE ) 200 MG tablet, Take 0.5 tablets (100 mg total) by mouth daily., Disp: 45 tablet, Rfl: 3   Cholecalciferol  (VITAMIN D -3) 25 MCG (1000 UT) CAPS, Take 1 capsule by mouth daily., Disp: , Rfl:    ELIQUIS  5 MG TABS tablet, TAKE 1 TABLET BY MOUTH TWICE A DAY, Disp: 60 tablet, Rfl: 6   estradiol  (VIVELLE -DOT) 0.05 MG/24HR patch, Place 1  patch onto the skin 2 (two) times a week. Tuesdays & Fridays, Disp: , Rfl:    FARXIGA  10 MG TABS tablet, TAKE 1 TABLET BY MOUTH EVERY DAY, Disp: 30 tablet, Rfl: 11   ivabradine  (CORLANOR ) 7.5 MG TABS tablet, Take 1 tablet (7.5 mg total) by mouth 2 (two) times daily with a meal., Disp: 60 tablet, Rfl: 11   levothyroxine  (SYNTHROID ) 100 MCG tablet, TAKE 1 TABLET BY MOUTH DAILY ON AN EMPTY STOMACH WITH ONLY WATER FOR 30 MINUTES & NO ANTACID/CALCIUM /MAGNESIUM  FOR 4 HOURS & AVOID BIOTIN, Disp: 90 tablet, Rfl: 0   losartan  (COZAAR ) 25 MG tablet, Take 0.5 tablets (12.5 mg total) by mouth daily., Disp: 45 tablet, Rfl: 3   ondansetron  (ZOFRAN -ODT) 4 MG disintegrating tablet, Take 1 tablet (4 mg total) by mouth every 8 (eight) hours as needed for nausea or vomiting., Disp: 30 tablet, Rfl: 3   pantoprazole  (PROTONIX ) 40 MG tablet, Take 1 tablet (40 mg total) by mouth daily., Disp: 30 tablet, Rfl: 3   potassium chloride  (KLOR-CON ) 10 MEQ tablet, Take 4 tablets (40 mEq total) by mouth daily., Disp: 120  tablet, Rfl: 11   pravastatin  (PRAVACHOL ) 20 MG tablet, Take 1 tablet (20 mg total) by mouth daily. (Patient not taking: Reported on 06/13/2023), Disp: 90 tablet, Rfl: 0   prochlorperazine  (COMPAZINE ) 10 MG tablet, TAKE 1 TABLET BY MOUTH EVERY 6 HOURS AS NEEDED FOR NAUSEA OR VOMITING., Disp: 30 tablet, Rfl: 0   spironolactone  (ALDACTONE ) 25 MG tablet, TAKE 1 TABLET BY MOUTH EVERY DAY, Disp: 90 tablet, Rfl: 3   torsemide  (DEMADEX ) 20 MG tablet, Take 2 tablets (40 mg total) by mouth daily. (Patient taking differently: Take 20 mg by mouth daily.), Disp: 60 tablet, Rfl: 11

## 2023-07-07 ENCOUNTER — Ambulatory Visit (INDEPENDENT_AMBULATORY_CARE_PROVIDER_SITE_OTHER): Admitting: Family Medicine

## 2023-07-07 ENCOUNTER — Encounter: Payer: Self-pay | Admitting: Gastroenterology

## 2023-07-07 ENCOUNTER — Encounter (HOSPITAL_COMMUNITY): Admission: RE | Admit: 2023-07-07 | Source: Ambulatory Visit

## 2023-07-07 ENCOUNTER — Encounter: Payer: Self-pay | Admitting: Family Medicine

## 2023-07-07 VITALS — BP 95/61 | HR 82 | Temp 98.1°F | Ht 62.0 in | Wt 179.8 lb

## 2023-07-07 DIAGNOSIS — E7800 Pure hypercholesterolemia, unspecified: Secondary | ICD-10-CM | POA: Diagnosis not present

## 2023-07-07 DIAGNOSIS — E876 Hypokalemia: Secondary | ICD-10-CM | POA: Diagnosis not present

## 2023-07-07 DIAGNOSIS — R7303 Prediabetes: Secondary | ICD-10-CM

## 2023-07-07 DIAGNOSIS — Z79899 Other long term (current) drug therapy: Secondary | ICD-10-CM

## 2023-07-07 LAB — BASIC METABOLIC PANEL WITH GFR
BUN: 15 mg/dL (ref 6–23)
CO2: 32 meq/L (ref 19–32)
Calcium: 9.4 mg/dL (ref 8.4–10.5)
Chloride: 96 meq/L (ref 96–112)
Creatinine, Ser: 1.15 mg/dL (ref 0.40–1.20)
GFR: 49.24 mL/min — ABNORMAL LOW (ref >60.00)
Glucose, Bld: 105 mg/dL — ABNORMAL HIGH (ref 70–99)
Potassium: 4.2 meq/L (ref 3.5–5.1)
Sodium: 138 meq/L (ref 135–145)

## 2023-07-07 LAB — TSH: TSH: 0.29 u[IU]/mL — ABNORMAL LOW (ref 0.35–5.50)

## 2023-07-07 LAB — HEMOGLOBIN A1C: Hgb A1c MFr Bld: 5.8 % (ref 4.6–6.5)

## 2023-07-07 MED ORDER — TORSEMIDE 20 MG PO TABS: 20.0000 mg | ORAL_TABLET | Freq: Every day | ORAL | Status: AC

## 2023-07-07 MED ORDER — POTASSIUM CHLORIDE ER 10 MEQ PO TBCR: EXTENDED_RELEASE_TABLET | ORAL | Status: AC

## 2023-07-07 NOTE — Progress Notes (Signed)
 OFFICE VISIT  07/07/2023  CC:  Chief Complaint  Patient presents with   Medical Management of Chronic Issues    Pt is not fasting    Patient is a 68 y.o. female who presents for 48-month follow-up hypercholesterolemia, hypokalemia, prediabetes, and hypothyroidism. A/P as of last visit: 1 hypercholesterolemia. Her last LDL was 165 on 12/13/2022. She did not tolerate rosuvastatin  5 mg. Will do trial of pravastatin  20 mg daily and recheck lipids in 3 months.   2 hypokalemia. She feels like she is on the maximum tolerable dose of KlorCon, 2 of the 20 mill equivalent tabs daily. Reviewed high potassium diet and gave handout today.   3  Acute left arm DVT March 2023. Given her malignancy (even though it is currently not clearly recurrent/progressing) I think she is at high risk for recurrent thrombosis so we will keep her on Eliquis  5 mg twice daily.   4 secondary nonischemic cardiomyopathy (adriamycin ).  Stable class II/III. Followed by CHF clinic: Current regimen Farxiga  10 mg a day, Corlanor  7.5 mg twice daily, losartan  12.5 mg daily, spironolactone  25 mg daily, and torsemide  40 mg daily.   5 history of V. tach, PVCs. Continue amiodarone  100 mg a day as per cardiology.   6 hypothyroidism. Continue levothyroxine  100 mcg daily. Will check TSH at next follow-up in 3 months.  INTERIM HX:  Doing well. She retired a couple of months ago. She has been doing cardiac rehab several days a week lately and feels like she is getting excellent benefit from this.  She has noted more hair thinning and slight deformity of her fingernails over the last 6 months or so.  At her last oncology visit a few weeks ago she was dehydrated and they decreased her dose of torsemide  to 20 mg once a day and her dose of potassium to 20 mill equivalents per day.  She did not tolerate pravastatin  due to fatigue and myalgias.  ROS as above, plus--> no fevers, no CP, no SOB, no wheezing, no cough, no  dizziness, no HAs, no rashes, no melena/hematochezia.  No polyuria or polydipsia.    No focal weakness, paresthesias, or tremors.  No acute vision or hearing abnormalities.  No dysuria or unusual/new urinary urgency or frequency.  No recent changes in lower legs. No n/v/d or abd pain.  No palpitations.    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 [facility] and others   Obesity    BMI 34   Osteoporosis    most recent DEXA 02/2022   Pre-diabetes    Secondary nonischemic congestive cardiomyopathy (HCC)    Adriamycin  2020   Ventricular tachycardia (HCC)    Vitamin D  deficiency     Past Surgical History:  Procedure Laterality Date   ABDOMINAL HYSTERECTOMY  1997   CESAREAN SECTION  1981   CHOLECYSTECTOMY N/A 09/25/2020   Procedure: LAPAROSCOPIC CHOLECYSTECTOMY;  Surgeon: Stevie Herlene Righter, MD;  Location: [facility];  Service: General;  Laterality: N/A;  60 MINUTES   COLONOSCOPY     03/2018 NO POLYPS   COLONOSCOPY W/ BIOPSIES  12/29/2006   IR IMAGING GUIDED PORT  INSERTION  01/15/2019   LAPAROSCOPY     1991 for emdometriosis   RIGHT/LEFT HEART CATH AND CORONARY ANGIOGRAPHY N/A 06/25/2019   Procedure: RIGHT/LEFT HEART CATH AND CORONARY ANGIOGRAPHY;  Surgeon: Cherrie Toribio SAUNDERS, MD;  Location: [facility];  Service: Cardiovascular;  Laterality: N/A;   TEE WITHOUT CARDIOVERSION N/A 07/04/2021   Procedure: TRANSESOPHAGEAL ECHOCARDIOGRAM (TEE);  Surgeon: Cherrie Toribio SAUNDERS, MD;  Location: [facility];  Service: Cardiovascular;  Laterality: N/A;   TRANSTHORACIC ECHOCARDIOGRAM     05/17/22 ejection fraction 35% (unchanged from 2023), global  hypokinesis, mild RV dysfunction, mild to moderate MR.   UPPER GASTROINTESTINAL ENDOSCOPY      Outpatient Medications Prior to Visit  Medication Sig Dispense Refill   amiodarone  (PACERONE ) 200 MG tablet Take 0.5 tablets (100 mg total) by mouth daily. 45 tablet 3   Cholecalciferol  (VITAMIN D -3) 25 MCG (1000 UT) CAPS Take 1 capsule by mouth daily.     ELIQUIS  5 MG TABS tablet TAKE 1 TABLET BY MOUTH TWICE A DAY 60 tablet 6   estradiol  (VIVELLE -DOT) 0.05 MG/24HR patch Place 1 patch onto the skin 2 (two) times a week. Tuesdays & Fridays     FARXIGA  10 MG TABS tablet TAKE 1 TABLET BY MOUTH EVERY DAY 30 tablet 11   ivabradine  (CORLANOR ) 7.5 MG TABS tablet Take 1 tablet (7.5 mg total) by mouth 2 (two) times daily with a meal. 60 tablet 11   levothyroxine  (SYNTHROID ) 100 MCG tablet TAKE 1 TABLET BY MOUTH DAILY ON AN EMPTY STOMACH WITH ONLY WATER FOR 30 MINUTES & NO ANTACID/CALCIUM /MAGNESIUM  FOR 4 HOURS & AVOID BIOTIN 90 tablet 0   losartan  (COZAAR ) 25 MG tablet Take 0.5 tablets (12.5 mg total) by mouth daily. 45 tablet 3   ondansetron  (ZOFRAN -ODT) 4 MG disintegrating tablet Take 1 tablet (4 mg total) by mouth every 8 (eight) hours as needed for nausea or vomiting. 30 tablet 3   prochlorperazine  (COMPAZINE ) 10 MG tablet TAKE 1 TABLET BY MOUTH EVERY 6 HOURS AS NEEDED FOR NAUSEA OR VOMITING. 30 tablet 0   spironolactone  (ALDACTONE ) 25 MG tablet TAKE 1 TABLET BY MOUTH EVERY DAY 90 tablet 3   potassium chloride  (KLOR-CON ) 10 MEQ tablet Take 4 tablets (40 mEq total) by mouth daily. 120 tablet 11   torsemide  (DEMADEX ) 20 MG tablet Take 2 tablets (40 mg total) by mouth daily. (Patient taking differently: Take 20 mg by mouth daily.) 60 tablet 11   pravastatin  (PRAVACHOL ) 20 MG tablet Take 1 tablet (20 mg total) by mouth daily. (Patient not taking: Reported on 07/07/2023) 90 tablet 0   pantoprazole  (PROTONIX ) 40 MG tablet Take 1 tablet (40 mg total) by mouth daily. (Patient not taking: Reported on 07/07/2023) 30  tablet 3   No facility-administered medications prior to visit.    Allergies  Allergen Reactions   Adriamycin  [Doxorubicin ] Other (See Comments)    Acute heart failure (chemo from Jan - May 2021)   Ppd [Tuberculin Purified Protein Derivative] Swelling    Positive reaction.  Negative Chest Xray 06/16/12    Review of Systems As per HPI  PE:    07/07/2023    9:51 AM 06/13/2023    3:41 PM 05/28/2023    2:04 PM  Vitals with BMI  Height 5' 2 5' 2 5' 2  Weight 179 lbs 13 oz 179 lbs 7 oz 180 lbs 2 oz  BMI 32.88 32.81 32.94  Systolic 95 102 112  Diastolic 61 58 68  Pulse 82 76 64  Physical Exam  Gen: Alert, well appearing.  Patient is oriented to person, place, time, and situation. AFFECT: pleasant, lucid thought and speech. CV: RRR, no m/r/g.   LUNGS: CTA bilat, nonlabored resps, good aeration in all lung fields. EXT: no clubbing or cyanosis.  no edema.    LABS:  Last CBC Lab Results  Component Value Date   WBC 4.1 06/13/2023   HGB 12.1 06/13/2023   HCT 37.7 06/13/2023   MCV 84.2 06/13/2023   MCH 27.8 05/01/2023   RDW 16.8 (H) 06/13/2023   PLT 359.0 06/13/2023   Last metabolic panel Lab Results  Component Value Date   GLUCOSE 116 (H) 06/13/2023   NA 136 06/13/2023   K 3.4 (L) 06/13/2023   CL 94 (L) 06/13/2023   CO2 34 (H) 06/13/2023   BUN 13 06/13/2023   CREATININE 1.33 (H) 06/13/2023   GFR 41.37 (L) 06/13/2023   CALCIUM  9.6 06/13/2023   PHOS 3.8 06/25/2019   PROT 6.8 06/13/2023   ALBUMIN 4.1 06/13/2023   BILITOT 0.4 06/13/2023   ALKPHOS 105 06/13/2023   AST 18 06/13/2023   ALT 13 06/13/2023   ANIONGAP 12 05/21/2023   Last lipids Lab Results  Component Value Date   CHOL 254 (H) 12/13/2022   HDL 67 12/13/2022   LDLCALC 165 (H) 12/13/2022   TRIG 107 12/13/2022   CHOLHDL 3.8 12/13/2022   Last hemoglobin A1c Lab Results  Component Value Date   HGBA1C 5.8 (H) 12/13/2022   Last thyroid  functions Lab Results  Component Value Date   TSH 2.99  12/13/2022   T4TOTAL 5.3 06/24/2019   Last vitamin D  Lab Results  Component Value Date   VD25OH 22 (L) 12/12/2021   Last vitamin B12 and Folate Lab Results  Component Value Date   VITAMINB12 406 12/13/2022   Lab Results  Component Value Date   HGBA1C 5.8 (H) 12/13/2022   IMPRESSION AND PLAN:  #1 hypothyroidism. She remains on amiodarone . Continue levothyroxine  100 mcg daily. Monitor TSH today.  #2 hypercholesterolemia. She is intolerant of multiple statins. Continue with diet and exercise only at this time.  3.  Prediabetes.  Good diet, great exercise. Monitor hemoglobin A1c today.  4.  Acute kidney injury/dehydration. About 3 weeks ago her serum creatinine elevated to 1.33.  She had mild low potassium at that time as well.  Her torsemide  and potassium doses were cut in half.  She got IV fluids at that time as well. Monitor bmet today.  5. Secondary nonischemic cardiomyopathy (adriamycin ).  Stable class II. Followed by CHF clinic: Current regimen Farxiga  10 mg a day, Corlanor  7.5 mg twice daily, losartan  12.5 mg daily, spironolactone  25 mg daily, and torsemide  20 mg daily. Doing excellent with cardiac rehab.   5 history of V. tach, PVCs. Continue amiodarone  100 mg a day as per cardiology. TSH monitoring today.  An After Visit Summary was printed and given to the patient.  FOLLOW UP: Return in about 3 months (around 10/07/2023) for routine chronic illness f/u. Next CPE March/April 2026 Signed:  Gerlene Hockey, MD           07/07/2023

## 2023-07-08 ENCOUNTER — Other Ambulatory Visit: Payer: Self-pay | Admitting: Family Medicine

## 2023-07-08 ENCOUNTER — Ambulatory Visit: Payer: Self-pay | Admitting: Family Medicine

## 2023-07-09 ENCOUNTER — Encounter (HOSPITAL_COMMUNITY)
Admission: RE | Admit: 2023-07-09 | Discharge: 2023-07-09 | Disposition: A | Discharge status: Home / Self Care | Source: Ambulatory Visit | Attending: Internal Medicine | Admitting: Internal Medicine

## 2023-07-09 DIAGNOSIS — I5022 Chronic systolic (congestive) heart failure: Secondary | ICD-10-CM | POA: Insufficient documentation

## 2023-07-14 ENCOUNTER — Ambulatory Visit: Payer: Medicare Other | Admitting: Nurse Practitioner

## 2023-07-14 ENCOUNTER — Encounter (HOSPITAL_COMMUNITY): Admission: RE | Admit: 2023-07-14 | Source: Ambulatory Visit

## 2023-07-16 ENCOUNTER — Encounter (HOSPITAL_COMMUNITY): Admission: RE | Admit: 2023-07-16 | Source: Ambulatory Visit

## 2023-07-16 ENCOUNTER — Telehealth (HOSPITAL_COMMUNITY): Payer: Self-pay

## 2023-07-18 ENCOUNTER — Encounter (HOSPITAL_COMMUNITY): Admission: RE | Admit: 2023-07-18 | Source: Ambulatory Visit

## 2023-07-21 ENCOUNTER — Encounter (HOSPITAL_COMMUNITY): Admission: RE | Admit: 2023-07-21 | Source: Ambulatory Visit

## 2023-07-21 ENCOUNTER — Telehealth (HOSPITAL_COMMUNITY): Payer: Self-pay

## 2023-07-21 NOTE — Telephone Encounter (Signed)
 Patient c/o for 12:30 CR class, states she has had a lot of pain from her cancer which is why she has not been in for her previous classes and why she is calling out today. She states she still wants to continue the program and hopes she will be feeling well enough to come to class Wednesday.

## 2023-07-22 ENCOUNTER — Encounter (HOSPITAL_COMMUNITY): Payer: Self-pay | Admitting: *Deleted

## 2023-07-22 DIAGNOSIS — I5022 Chronic systolic (congestive) heart failure: Secondary | ICD-10-CM

## 2023-07-22 NOTE — Progress Notes (Signed)
 Cardiac Individual Treatment Plan  Patient Details  Name: Natalie Baker MRN: [id number] Date of Birth: Jan 30, 1955 Referring Provider:   Flowsheet Row INTENSIVE CARDIAC REHAB ORIENT from 05/28/2023 in [facility]  Referring Provider Toribio Fuel, MD    Initial Encounter Date:  Flowsheet Row INTENSIVE CARDIAC REHAB ORIENT from 05/28/2023 in [facility]  Date 05/28/23    Visit Diagnosis: Heart failure, chronic systolic (HCC)  Patient's Home Medications on Admission:  Current Outpatient Medications:    amiodarone  (PACERONE ) 200 MG tablet, Take 0.5 tablets (100 mg total) by mouth daily., Disp: 45 tablet, Rfl: 3   Cholecalciferol  (VITAMIN D -3) 25 MCG (1000 UT) CAPS, Take 1 capsule by mouth daily., Disp: , Rfl:    ELIQUIS  5 MG TABS tablet, TAKE 1 TABLET BY MOUTH TWICE A DAY, Disp: 60 tablet, Rfl: 6   estradiol  (VIVELLE -DOT) 0.05 MG/24HR patch, Place 1 patch onto the skin 2 (two) times a week. Tuesdays & Fridays, Disp: , Rfl:    FARXIGA  10 MG TABS tablet, TAKE 1 TABLET BY MOUTH EVERY DAY, Disp: 30 tablet, Rfl: 11   ivabradine  (CORLANOR ) 7.5 MG TABS tablet, Take 1 tablet (7.5 mg total) by mouth 2 (two) times daily with a meal., Disp: 60 tablet, Rfl: 11   levothyroxine  (SYNTHROID ) 100 MCG tablet, TAKE 1 TABLET BY MOUTH DAILY ON AN EMPTY STOMACH WITH ONLY WATER FOR 30 MINUTES & NO ANTACID/CALCIUM /MAGNESIUM  FOR 4 HOURS & AVOID BIOTIN, Disp: 90 tablet, Rfl: 0   losartan  (COZAAR ) 25 MG tablet, Take 0.5 tablets (12.5 mg total) by mouth daily., Disp: 45 tablet, Rfl: 3   ondansetron  (ZOFRAN -ODT) 4 MG disintegrating tablet, Take 1 tablet (4 mg total) by mouth every 8 (eight) hours as needed for nausea or vomiting., Disp: 30 tablet, Rfl: 3   potassium chloride  (KLOR-CON ) 10 MEQ tablet, 2 tabs po qd, Disp: , Rfl:    pravastatin  (PRAVACHOL ) 20 MG tablet, Take 1 tablet (20 mg total) by  mouth daily. 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 [facility] and others   Obesity    BMI 34   Osteoporosis    most recent DEXA 02/2022   Pre-diabetes    Secondary nonischemic congestive cardiomyopathy (HCC)    Adriamycin  2020   Ventricular tachycardia (HCC)    Vitamin D  deficiency     Tobacco Use: Social History   Tobacco Use  Smoking Status Former   Current packs/day: 0.00   Types: Cigarettes   Start date: 01/07/1981   Quit date: 01/08/1996   Years since quitting: 27.5  Smokeless Tobacco Never  Tobacco Comments   quit 1998 but is exposed to 2nd hand smoke    Labs: Review Flowsheet  More data exists      Latest Ref Rng & Units 03/18/2021 03/19/2021 09/05/2021 12/13/2022 07/07/2023  Labs for ITP Cardiac and Pulmonary Rehab  Cholestrol <200 mg/dL - - 749  745  -  LDL (calc) mg/dL (calc) - - 844  834  -  HDL-C > OR = 50 mg/dL - - 59  67  -  Trlycerides <150 mg/dL - - 802  892  -  Hemoglobin A1c 4.6 - 6.5 % - - 5.5  5.8  5.8   O2 Saturation % 59.6  60  - - -    Capillary Blood Glucose: Lab Results  Component Value Date   GLUCAP 100 (H) 10/31/2021   GLUCAP 85 09/25/2020   GLUCAP 101 (H) 09/21/2020   GLUCAP 117  (H) 06/26/2019   GLUCAP 104 (H) 06/10/2019     Exercise Target Goals: Exercise Program Goal: Individual exercise prescription set using results from initial 6 min walk test and THRR while considering  patient's activity barriers and safety.   Exercise Prescription Goal: Initial exercise prescription builds to 30-45 minutes a day of aerobic activity, 2-3 days per week.  Home exercise guidelines will be given to patient during program as part of exercise prescription that the participant will acknowledge.  Activity Barriers & Risk Stratification:  Activity Barriers & Cardiac Risk Stratification - 05/28/23 1409       Activity Barriers & Cardiac Risk Stratification   Activity Barriers Deconditioning;Decreased Ventricular Function;Balance Concerns    Cardiac Risk Stratification High   <5 METs on         6 Minute Walk:  6 Minute Walk     Row Name 05/28/23 1530         6 Minute Walk   Phase Initial     Distance 1385 feet     Walk Time 6 minutes     # of Rest Breaks 0     MPH 2.62     METS 2.93     RPE 8     Perceived Dyspnea  0     VO2 Peak 10.26     Symptoms No     Resting HR 64 bpm     Resting BP 112/68     Resting Oxygen Saturation  98 %     Exercise Oxygen Saturation  during 6 min walk 98 %     Max Ex. HR 121 bpm     Max Ex. BP 118/62     2 Minute Post BP 116/70        Oxygen Initial Assessment:   Oxygen Re-Evaluation:   Oxygen Discharge (Final Oxygen Re-Evaluation):   Initial Exercise Prescription:  Initial Exercise Prescription - 05/28/23 1500       Date of Initial Exercise RX and Referring Provider   Date 05/28/23    Referring Provider Toribio Fuel, MD    Expected Discharge Date 08/20/23      Recumbant Bike   Level 1    RPM 55    Watts 20    Minutes 15    METs 2      Track   Laps 15    Minutes 15    METs 2      Prescription Details   Frequency (times per week) 3    Duration Progress to 30 minutes of continuous aerobic  without signs/symptoms of physical distress  Intensity   THRR 40-80% of Max Heartrate 61-122    Ratings of Perceived Exertion 11-13    Perceived Dyspnea 0-4      Progression   Progression Continue progressive overload as per policy without signs/symptoms or physical distress.      Resistance Training   Training Prescription Yes    Weight 2    Reps 10-15          Perform Capillary Blood Glucose checks as needed.  Exercise Prescription Changes:   Exercise Prescription Changes     Row Name 06/11/23 1400 06/30/23 1500           Response to Exercise   Blood Pressure (Admit) 112/62 90/60      Blood Pressure (Exercise) 122/62 112/60      Blood Pressure (Exit) 112/72 108/68      Heart Rate (Admit) 70 bpm 77 bpm      Heart Rate (Exercise) 104 bpm 103 bpm      Heart Rate (Exit) 81 bpm 83 bpm      Rating of Perceived Exertion (Exercise) 10 10      Symptoms None None      Comments Pt's first day in the [facility] program Reviewed METs      Duration Continue with 30 min of aerobic exercise without signs/symptoms of physical distress. Continue with 30 min of aerobic exercise without signs/symptoms of physical distress.      Intensity THRR unchanged THRR unchanged        Progression   Progression Continue to progress workloads to maintain intensity without signs/symptoms of physical distress. Continue to progress workloads to maintain intensity without signs/symptoms of physical distress.      Average METs 2.4 2.5        Resistance Training   Training Prescription No Yes      Weight No weights on wednesdays 2 lbs      Reps -- 10-15      Time -- 5 Minutes        Interval Training   Interval Training No No        Recumbant Bike   Level 1 2      RPM 68 54      Watts 14 12      Minutes 15 15      METs 2 1.9        Track   Laps 14 16      Minutes 15 15      METs 2.79 3.04         Exercise Comments:   Exercise Comments     Row Name 06/11/23 1420 06/30/23 1559  07/14/23 0928       Exercise Comments Pt's first day in the [facility] program. Pt exercised without complaints. Reviewed METs with patient today. Pt is making progress. No changes to workloads today. Pt due for review METs and goals but did not attend today. will complete upon her return.        Exercise Goals and Review:   Exercise Goals     Row Name 05/28/23 1410             Exercise Goals   Increase Physical Activity Yes       Intervention Provide advice, education, support and counseling about physical activity/exercise needs.;Develop an individualized exercise prescription for aerobic and resistive training based on initial evaluation findings, risk stratification, comorbidities and participant's personal goals.       Expected Outcomes Short Term: Attend rehab on  a regular basis to increase amount of physical activity.;Long Term: Exercising regularly at least 3-5 days a week.;Long Term: Add in home exercise to make exercise part of routine and to increase amount of physical activity.       Increase Strength and Stamina Yes       Intervention Provide advice, education, support and counseling about physical activity/exercise needs.;Develop an individualized exercise prescription for aerobic and resistive training based on initial evaluation findings, risk stratification, comorbidities and participant's personal goals.       Expected Outcomes Short Term: Increase workloads from initial exercise prescription for resistance, speed, and METs.;Short Term: Perform resistance training exercises routinely during rehab and add in resistance training at home;Long Term: Improve cardiorespiratory fitness, muscular endurance and strength as measured by increased METs and functional capacity ( )       Able to understand and use rate of perceived exertion (RPE) scale Yes       Intervention Provide education and explanation on how to use RPE scale       Expected Outcomes Short Term: Able to use RPE daily  in rehab to express subjective intensity level;Long Term:  Able to use RPE to guide intensity level when exercising independently       Knowledge and understanding of Target Heart Rate Range (THRR) Yes       Intervention Provide education and explanation of THRR including how the numbers were predicted and where they are located for reference       Expected Outcomes Short Term: Able to state/look up THRR;Short Term: Able to use daily as guideline for intensity in rehab;Long Term: Able to use THRR to govern intensity when exercising independently       Understanding of Exercise Prescription Yes       Intervention Provide education, explanation, and written materials on patient's individual exercise prescription       Expected Outcomes Short Term: Able to explain program exercise prescription;Long Term: Able to explain home exercise prescription to exercise independently          Exercise Goals Re-Evaluation :  Exercise Goals Re-Evaluation     Row Name 06/11/23 1419             Exercise Goal Re-Evaluation   Exercise Goals Review Increase Physical Activity;Understanding of Exercise Prescription;Increase Strength and Stamina;Knowledge and understanding of Target Heart Rate Range (THRR);Able to understand and use rate of perceived exertion (RPE) scale       Comments Pt's first day in the [facility] program. Pt understands the exercise Rx, THRR and RPE scale.       Expected Outcomes Will continue to monitor patient and progress exercise workloads as tolerated.          Discharge Exercise Prescription (Final Exercise Prescription Changes):  Exercise Prescription Changes - 06/30/23 1500       Response to Exercise   Blood Pressure (Admit) 90/60    Blood Pressure (Exercise) 112/60    Blood Pressure (Exit) 108/68    Heart Rate (Admit) 77 bpm    Heart Rate (Exercise) 103 bpm    Heart Rate (Exit) 83 bpm    Rating of Perceived Exertion (Exercise) 10    Symptoms None    Comments Reviewed METs     Duration Continue with 30 min of aerobic exercise without signs/symptoms of physical distress.    Intensity THRR unchanged      Progression   Progression Continue to progress workloads to maintain intensity without signs/symptoms of physical distress.  Average METs 2.5      Resistance Training   Training Prescription Yes    Weight 2 lbs    Reps 10-15    Time 5 Minutes      Interval Training   Interval Training No      Recumbant Bike   Level 2    RPM 54    Watts 12    Minutes 15    METs 1.9      Track   Laps 16    Minutes 15    METs 3.04          Nutrition:  Target Goals: Understanding of nutrition guidelines, daily intake of sodium 1500mg , cholesterol 200mg , calories 30% from fat and 7% or less from saturated fats, daily to have 5 or more servings of fruits and vegetables.  Biometrics:  Pre Biometrics - 05/28/23 1404       Pre Biometrics   Waist Circumference 44 inches    Hip Circumference 47 inches    Waist to Hip Ratio 0.94 %    Triceps Skinfold 37 mm    % Body Fat 46.9 %    Grip Strength 10 kg    Flexibility 11 in    Single Leg Stand 5.73 seconds           Nutrition Therapy Plan and Nutrition Goals:  Nutrition Therapy & Goals - 07/10/23 1337       Nutrition Therapy   Diet Heart Healthy Diet      Personal Nutrition Goals   Nutrition Goal Patient to identify strategies for reducing cardiovascular risk by attending the [facility] education and nutrition series weekly.   goal not met.   Personal Goal #2 Patient to improve diet quality by using the plate method as a guide for meal planning to include lean protein/plant protein, fruits, vegetables, whole grains, nonfat dairy as part of a well-balanced diet.   goal in progress.   Comments Lizette has medical history of HTN, non hodgkins lymphoma, mantle cell lymphoma diagnosed in 2020 and completed treatment 2023, systolic HF, HTN, hyperlipidemia, preDM. Attendance has been variable; she has attended 4  [facility] education sessions. She was referred to GI for unwanted weight loss and persistent abdominal pain at 05/01/23 oncology follow-up. She has maintained her weight since starting with our program. A1c remains in a prediabetic range. LDL is not at goal. Patient will benefit from participation in intensive cardiac rehab for nutrition, exercise, and lifestyle modification.      Intervention Plan   Intervention Prescribe, educate and counsel regarding individualized specific dietary modifications aiming towards targeted core components such as weight, hypertension, lipid management, diabetes, heart failure and other comorbidities.;Nutrition handout(s) given to patient.    Expected Outcomes Short Term Goal: Understand basic principles of dietary content, such as calories, fat, sodium, cholesterol and nutrients.;Long Term Goal: Adherence to prescribed nutrition plan.          Nutrition Assessments:  MEDIFICTS Score Key: >=70 Need to make dietary changes  40-70 Heart Healthy Diet <= 40 Therapeutic Level Cholesterol Diet    Picture Your Plate Scores: <59 Unhealthy dietary pattern with much room for improvement. 41-50 Dietary pattern unlikely to meet recommendations for good health and room for improvement. 51-60 More healthful dietary pattern, with some room for improvement.  >60 Healthy dietary pattern, although there may be some specific behaviors that could be improved.    Nutrition Goals Re-Evaluation:  Nutrition Goals Re-Evaluation     Row Name 06/05/23 1133 07/10/23 1337  Goals   Current Weight 180 lb 1.9 oz (81.7 kg) 180 lb 5.4 oz (81.8 kg)      Comment A1c 5.8, LDL 165, cholesterol 254 A1c 5.8, GFR 49, LDL 165, cholesterol 254      Expected Outcome Charlissa has medical history of HTN, non hodgkins lymphoma, mantle cell lymphoma diagnosed in 2020 and completed treatment 2023, systolic HF, HTN, hyperlipidemia, preDM. She was referred to GI for unwanted weight loss and  persistent abdominal pain at 05/01/23 oncology follow-up. A1c remains in a prediabetic range. LDL is not at goal. Patient will benefit from participation in intensive cardiac rehab for nutrition, exercise, and lifestyle modification. Zaya has medical history of HTN, non hodgkins lymphoma, mantle cell lymphoma diagnosed in 2020 and completed treatment 2023, systolic HF, HTN, hyperlipidemia, preDM. Attendance has been variable; she has attended 4 [facility] education sessions. She was referred to GI for unwanted weight loss and persistent abdominal pain at 05/01/23 oncology follow-up. She has maintained her weight since starting with our program. A1c remains in a prediabetic range. LDL is not at goal. Patient will benefit from participation in intensive cardiac rehab for nutrition, exercise, and lifestyle modification.         Nutrition Goals Re-Evaluation:  Nutrition Goals Re-Evaluation     Row Name 06/05/23 1133 07/10/23 1337           Goals   Current Weight 180 lb 1.9 oz (81.7 kg) 180 lb 5.4 oz (81.8 kg)      Comment A1c 5.8, LDL 165, cholesterol 254 A1c 5.8, GFR 49, LDL 165, cholesterol 254      Expected Outcome Quinley has medical history of HTN, non hodgkins lymphoma, mantle cell lymphoma diagnosed in 2020 and completed treatment 2023, systolic HF, HTN, hyperlipidemia, preDM. She was referred to GI for unwanted weight loss and persistent abdominal pain at 05/01/23 oncology follow-up. A1c remains in a prediabetic range. LDL is not at goal. Patient will benefit from participation in intensive cardiac rehab for nutrition, exercise, and lifestyle modification. Rajanae has medical history of HTN, non hodgkins lymphoma, mantle cell lymphoma diagnosed in 2020 and completed treatment 2023, systolic HF, HTN, hyperlipidemia, preDM. Attendance has been variable; she has attended 4 [facility] education sessions. She was referred to GI for unwanted weight loss and persistent abdominal pain at 05/01/23 oncology  follow-up. She has maintained her weight since starting with our program. A1c remains in a prediabetic range. LDL is not at goal. Patient will benefit from participation in intensive cardiac rehab for nutrition, exercise, and lifestyle modification.         Nutrition Goals Discharge (Final Nutrition Goals Re-Evaluation):  Nutrition Goals Re-Evaluation - 07/10/23 1337       Goals   Current Weight 180 lb 5.4 oz (81.8 kg)    Comment A1c 5.8, GFR 49, LDL 165, cholesterol 254    Expected Outcome Darriona has medical history of HTN, non hodgkins lymphoma, mantle cell lymphoma diagnosed in 2020 and completed treatment 2023, systolic HF, HTN, hyperlipidemia, preDM. Attendance has been variable; she has attended 4 [facility] education sessions. She was referred to GI for unwanted weight loss and persistent abdominal pain at 05/01/23 oncology follow-up. She has maintained her weight since starting with our program. A1c remains in a prediabetic range. LDL is not at goal. Patient will benefit from participation in intensive cardiac rehab for nutrition, exercise, and lifestyle modification.          Psychosocial: Target Goals: Acknowledge presence or absence of significant depression and/or stress,  maximize coping skills, provide positive support system. Participant is able to verbalize types and ability to use techniques and skills needed for reducing stress and depression.  Initial Review & Psychosocial Screening:  Initial Psych Review & Screening - 05/28/23 1410       Initial Review   Current issues with None Identified      Family Dynamics   Good Support System? Yes   husband     Barriers   Psychosocial barriers to participate in program There are no identifiable barriers or psychosocial needs.      Screening Interventions   Interventions Encouraged to exercise;Provide feedback about the scores to participant    Expected Outcomes Short Term goal: Identification and review with participant of any  Quality of Life or Depression concerns found by scoring the questionnaire.;Long Term goal: The participant improves quality of Life and PHQ9 Scores as seen by post scores and/or verbalization of changes          Quality of Life Scores:  Quality of Life - 05/28/23 1533       Quality of Life   Select Quality of Life      Quality of Life Scores   Health/Function Pre 23.14 %    Socioeconomic Pre 30 %    Psych/Spiritual Pre 28.14 %    Family Pre 30 %    GLOBAL Pre 26.48 %         Scores of 19 and below usually indicate a poorer quality of life in these areas.  A difference of  2-3 points is a clinically meaningful difference.  A difference of 2-3 points in the total score of the Quality of Life Index has been associated with significant improvement in overall quality of life, self-image, physical symptoms, and general health in studies assessing change in quality of life.  PHQ-9: Review Flowsheet  More data exists      07/07/2023 05/28/2023 04/07/2023 01/09/2023 12/12/2020  Depression screen PHQ 2/9  Decreased Interest 0 0 0 0 0  Down, Depressed, Hopeless 0 0 0 0 1  PHQ - 2 Score 0 0 0 0 1  Altered sleeping - 1 1 - -  Tired, decreased energy - 1 3 - -  Change in appetite - 0 2 - -  Feeling bad or failure about yourself  - 0 0 - -  Trouble concentrating - 0 0 - -  Moving slowly or fidgety/restless - 0 1 - -  Suicidal thoughts - 0 0 - -  PHQ-9 Score - 2 7 - -  Difficult doing work/chores - Not difficult at all Somewhat difficult - -   Interpretation of Total Score  Total Score Depression Severity:  1-4 = Minimal depression, 5-9 = Mild depression, 10-14 = Moderate depression, 15-19 = Moderately severe depression, 20-27 = Severe depression   Psychosocial Evaluation and Intervention:   Psychosocial Re-Evaluation:  Psychosocial Re-Evaluation     Row Name 06/11/23 1441 06/24/23 1742 07/22/23 1137         Psychosocial Re-Evaluation   Current issues with None Identified Current  Stress Concerns Current Stress Concerns     Comments -- Wiley says she has been having GI issues. Cherisa has recently followed up with GI last day of exercise was on 06/30/23. Unable to reassess at this time     Expected Outcomes -- Grayce will have controlled or decreased stressors upon completion of cardiac rehab Grayce will have controlled or decreased stressors upon completion of cardiac rehab  Interventions Encouraged to attend Cardiac Rehabilitation for the exercise Encouraged to attend Cardiac Rehabilitation for the exercise Encouraged to attend Cardiac Rehabilitation for the exercise     Continue Psychosocial Services  No Follow up required No Follow up required No Follow up required       Initial Review   Source of Stress Concerns -- Chronic Illness Chronic Illness     Comments -- Will continue to monitor and offer support as needed Will continue to monitor and offer support as needed        Psychosocial Discharge (Final Psychosocial Re-Evaluation):  Psychosocial Re-Evaluation - 07/22/23 1137       Psychosocial Re-Evaluation   Current issues with Current Stress Concerns    Comments last day of exercise was on 06/30/23. Unable to reassess at this time    Expected Outcomes Grayce will have controlled or decreased stressors upon completion of cardiac rehab    Interventions Encouraged to attend Cardiac Rehabilitation for the exercise    Continue Psychosocial Services  No Follow up required      Initial Review   Source of Stress Concerns Chronic Illness    Comments Will continue to monitor and offer support as needed          Vocational Rehabilitation: Provide vocational rehab assistance to qualifying candidates.   Vocational Rehab Evaluation & Intervention:  Vocational Rehab - 05/28/23 1410       Initial Vocational Rehab Evaluation & Intervention   Assessment shows need for Vocational Rehabilitation No   retired         Education: Education Goals: Education classes  will be provided on a weekly basis, covering required topics. Participant will state understanding/return demonstration of topics presented.    Education     Row Name 06/04/23 1600     Education   Cardiac Education Topics [facility]   Orthoptist   Educator Dietitian   Weekly Topic [employer]   Instruction Review Code 1- Verbalizes Understanding   Class Start Time 1400   Class Stop Time 1437   Class Time Calculation (min) 37 min    Row Name 06/11/23 1500     Education   Cardiac Education Topics [employer]   Customer service manager   Weekly Topic [employer] - Meals in a Snap   Instruction Review Code 1- Verbalizes Understanding   Class Start Time 1400   Class Stop Time 1442   Class Time Calculation (min) 42 min    Row Name 06/16/23 1300     Education   Cardiac Education Topics [facility]   Hospital doctor Education   General Education Hypertension and Heart Disease   Instruction Review Code 1- Verbalizes Understanding   Class Start Time 1355   Class Stop Time 1443   Class Time Calculation (min) 48 min    Row Name 06/18/23 1500     Education   Cardiac Education Topics [facility]   Orthoptist   Educator Dietitian   Weekly Topic One-Pot Wonders   Instruction Review Code 1- Verbalizes Understanding   Class Start Time 1400   Class Stop Time 1445   Class Time Calculation (min) 45 min    Row Name 06/30/23 1400     Education   Cardiac Education Topics --  Select --     Core Videos   Educator --   Select --   Exercise Education --   Instruction Review Code --      Core Videos: Exercise    Move It!  Clinical staff conducted group or individual video education with verbal and written material and guidebook.  Patient learns the recommended Pritikin exercise program. Exercise with  the goal of living a long, healthy life. Some of the health benefits of exercise include controlled diabetes, healthier blood pressure levels, improved cholesterol levels, improved heart and lung capacity, improved sleep, and better body composition. Everyone should speak with their doctor before starting or changing an exercise routine.  Biomechanical Limitations Clinical staff conducted group or individual video education with verbal and written material and guidebook.  Patient learns how biomechanical limitations can impact exercise and how we can mitigate and possibly overcome limitations to have an impactful and balanced exercise routine.  Body Composition Clinical staff conducted group or individual video education with verbal and written material and guidebook.  Patient learns that body composition (ratio of muscle mass to fat mass) is a key component to assessing overall fitness, rather than body weight alone. Increased fat mass, especially visceral belly fat, can put us  at increased risk for metabolic syndrome, type 2 diabetes, heart disease, and even death. It is recommended to combine diet and exercise (cardiovascular and resistance training) to improve your body composition. Seek guidance from your physician and exercise physiologist before implementing an exercise routine.  Exercise Action Plan Clinical staff conducted group or individual video education with verbal and written material and guidebook.  Patient learns the recommended strategies to achieve and enjoy long-term exercise adherence, including variety, self-motivation, self-efficacy, and positive decision making. Benefits of exercise include fitness, good health, weight management, more energy, better sleep, less stress, and overall well-being.  Medical   Heart Disease Risk Reduction Clinical staff conducted group or individual video education with verbal and written material and guidebook.  Patient learns our heart is our  most vital organ as it circulates oxygen, nutrients, white blood cells, and hormones throughout the entire body, and carries waste away. Data supports a plant-based eating plan like the Pritikin Program for its effectiveness in slowing progression of and reversing heart disease. The video provides a number of recommendations to address heart disease.   Metabolic Syndrome and Belly Fat  Clinical staff conducted group or individual video education with verbal and written material and guidebook.  Patient learns what metabolic syndrome is, how it leads to heart disease, and how one can reverse it and keep it from coming back. You have metabolic syndrome if you have 3 of the following 5 criteria: abdominal obesity, high blood pressure, high triglycerides, low HDL cholesterol, and high blood sugar.  Hypertension and Heart Disease Clinical staff conducted group or individual video education with verbal and written material and guidebook.  Patient learns that high blood pressure, or hypertension, is very common in the [place] . Hypertension is largely due to excessive salt intake, but other important risk factors include being overweight, physical inactivity, drinking too much alcohol, smoking, and not eating enough potassium from fruits and vegetables. High blood pressure is a leading risk factor for heart attack, stroke, congestive heart failure, dementia, kidney failure, and premature death. Long-term effects of excessive salt intake include stiffening of the arteries and thickening of heart muscle and organ damage. Recommendations include ways to reduce hypertension and the risk of heart disease.  Diseases of Our Time -  Focusing on Diabetes Clinical staff conducted group or individual video education with verbal and written material and guidebook.  Patient learns why the best way to stop diseases of our time is prevention, through food and other lifestyle changes. Medicine (such as prescription pills  and surgeries) is often only a Band-Aid on the problem, not a long-term solution. Most common diseases of our time include obesity, type 2 diabetes, hypertension, heart disease, and cancer. The Pritikin Program is recommended and has been proven to help reduce, reverse, and/or prevent the damaging effects of metabolic syndrome.  Nutrition   Overview of the Pritikin Eating Plan  Clinical staff conducted group or individual video education with verbal and written material and guidebook.  Patient learns about the Pritikin Eating Plan for disease risk reduction. The Pritikin Eating Plan emphasizes a wide variety of unrefined, minimally-processed carbohydrates, like fruits, vegetables, whole grains, and legumes. Go, Caution, and Stop food choices are explained. Plant-based and lean animal proteins are emphasized. Rationale provided for low sodium intake for blood pressure control, low added sugars for blood sugar stabilization, and low added fats and oils for coronary artery disease risk reduction and weight management.  Calorie Density  Clinical staff conducted group or individual video education with verbal and written material and guidebook.  Patient learns about calorie density and how it impacts the Pritikin Eating Plan. Knowing the characteristics of the food you choose will help you decide whether those foods will lead to weight gain or weight loss, and whether you want to consume more or less of them. Weight loss is usually a side effect of the Pritikin Eating Plan because of its focus on low calorie-dense foods.  Label Reading  Clinical staff conducted group or individual video education with verbal and written material and guidebook.  Patient learns about the Pritikin recommended label reading guidelines and corresponding recommendations regarding calorie density, added sugars, sodium content, and whole grains.  Dining Out - Part 1  Clinical staff conducted group or individual video education  with verbal and written material and guidebook.  Patient learns that restaurant meals can be sabotaging because they can be so high in calories, fat, sodium, and/or sugar. Patient learns recommended strategies on how to positively address this and avoid unhealthy pitfalls.  Facts on Fats  Clinical staff conducted group or individual video education with verbal and written material and guidebook.  Patient learns that lifestyle modifications can be just as effective, if not more so, as many medications for lowering your risk of heart disease. A Pritikin lifestyle can help to reduce your risk of inflammation and atherosclerosis (cholesterol build-up, or plaque, in the artery walls). Lifestyle interventions such as dietary choices and physical activity address the cause of atherosclerosis. A review of the types of fats and their impact on blood cholesterol levels, along with dietary recommendations to reduce fat intake is also included.  Nutrition Action Plan  Clinical staff conducted group or individual video education with verbal and written material and guidebook.  Patient learns how to incorporate Pritikin recommendations into their lifestyle. Recommendations include planning and keeping personal health goals in mind as an important part of their success.  Healthy Mind-Set    Healthy Minds, Bodies, Hearts  Clinical staff conducted group or individual video education with verbal and written material and guidebook.  Patient learns how to identify when they are stressed. Video will discuss the impact of that stress, as well as the many benefits of stress management. Patient will also be introduced to stress  management techniques. The way we think, act, and feel has an impact on our hearts.  How Our Thoughts Can Heal Our Hearts  Clinical staff conducted group or individual video education with verbal and written material and guidebook.  Patient learns that negative thoughts can cause depression and  anxiety. This can result in negative lifestyle behavior and serious health problems. Cognitive behavioral therapy is an effective method to help control our thoughts in order to change and improve our emotional outlook.  Additional Videos:  Exercise    Improving Performance  Clinical staff conducted group or individual video education with verbal and written material and guidebook.  Patient learns to use a non-linear approach by alternating intensity levels and lengths of time spent exercising to help burn more calories and lose more body fat. Cardiovascular exercise helps improve heart health, metabolism, hormonal balance, blood sugar control, and recovery from fatigue. Resistance training improves strength, endurance, balance, coordination, reaction time, metabolism, and muscle mass. Flexibility exercise improves circulation, posture, and balance. Seek guidance from your physician and exercise physiologist before implementing an exercise routine and learn your capabilities and proper form for all exercise.  Introduction to Yoga  Clinical staff conducted group or individual video education with verbal and written material and guidebook.  Patient learns about yoga, a discipline of the coming together of mind, breath, and body. The benefits of yoga include improved flexibility, improved range of motion, better posture and core strength, increased lung function, weight loss, and positive self-image. Yoga's heart health benefits include lowered blood pressure, healthier heart rate, decreased cholesterol and triglyceride levels, improved immune function, and reduced stress. Seek guidance from your physician and exercise physiologist before implementing an exercise routine and learn your capabilities and proper form for all exercise.  Medical   Aging: Enhancing Your Quality of Life  Clinical staff conducted group or individual video education with verbal and written material and guidebook.  Patient learns  key strategies and recommendations to stay in good physical health and enhance quality of life, such as prevention strategies, having an advocate, securing a Health Care Proxy and Power of Attorney, and keeping a list of medications and system for tracking them. It also discusses how to avoid risk for bone loss.  Biology of Weight Control  Clinical staff conducted group or individual video education with verbal and written material and guidebook.  Patient learns that weight gain occurs because we consume more calories than we burn (eating more, moving less). Even if your body weight is normal, you may have higher ratios of fat compared to muscle mass. Too much body fat puts you at increased risk for cardiovascular disease, heart attack, stroke, type 2 diabetes, and obesity-related cancers. In addition to exercise, following the Pritikin Eating Plan can help reduce your risk.  Decoding Lab Results  Clinical staff conducted group or individual video education with verbal and written material and guidebook.  Patient learns that lab test reflects one measurement whose values change over time and are influenced by many factors, including medication, stress, sleep, exercise, food, hydration, pre-existing medical conditions, and more. It is recommended to use the knowledge from this video to become more involved with your lab results and evaluate your numbers to speak with your doctor.   Diseases of Our Time - Overview  Clinical staff conducted group or individual video education with verbal and written material and guidebook.  Patient learns that according to the CDC, 50% to 70% of chronic diseases (such as obesity, type 2 diabetes, elevated  lipids, hypertension, and heart disease) are avoidable through lifestyle improvements including healthier food choices, listening to satiety cues, and increased physical activity.  Sleep Disorders Clinical staff conducted group or individual video education with verbal  and written material and guidebook.  Patient learns how good quality and duration of sleep are important to overall health and well-being. Patient also learns about sleep disorders and how they impact health along with recommendations to address them, including discussing with a physician.  Nutrition  Dining Out - Part 2 Clinical staff conducted group or individual video education with verbal and written material and guidebook.  Patient learns how to plan ahead and communicate in order to maximize their dining experience in a healthy and nutritious manner. Included are recommended food choices based on the type of restaurant the patient is visiting.   Fueling a Banker conducted group or individual video education with verbal and written material and guidebook.  There is a strong connection between our food choices and our health. Diseases like obesity and type 2 diabetes are very prevalent and are in large-part due to lifestyle choices. The Pritikin Eating Plan provides plenty of food and hunger-curbing satisfaction. It is easy to follow, affordable, and helps reduce health risks.  Menu Workshop  Clinical staff conducted group or individual video education with verbal and written material and guidebook.  Patient learns that restaurant meals can sabotage health goals because they are often packed with calories, fat, sodium, and sugar. Recommendations include strategies to plan ahead and to communicate with the manager, chef, or server to help order a healthier meal.  Planning Your Eating Strategy  Clinical staff conducted group or individual video education with verbal and written material and guidebook.  Patient learns about the Pritikin Eating Plan and its benefit of reducing the risk of disease. The Pritikin Eating Plan does not focus on calories. Instead, it emphasizes high-quality, nutrient-rich foods. By knowing the characteristics of the foods, we choose, we can  determine their calorie density and make informed decisions.  Targeting Your Nutrition Priorities  Clinical staff conducted group or individual video education with verbal and written material and guidebook.  Patient learns that lifestyle habits have a tremendous impact on disease risk and progression. This video provides eating and physical activity recommendations based on your personal health goals, such as reducing LDL cholesterol, losing weight, preventing or controlling type 2 diabetes, and reducing high blood pressure.  Vitamins and Minerals  Clinical staff conducted group or individual video education with verbal and written material and guidebook.  Patient learns different ways to obtain key vitamins and minerals, including through a recommended healthy diet. It is important to discuss all supplements you take with your doctor.   Healthy Mind-Set    Smoking Cessation  Clinical staff conducted group or individual video education with verbal and written material and guidebook.  Patient learns that cigarette smoking and tobacco addiction pose a serious health risk which affects millions of people. Stopping smoking will significantly reduce the risk of heart disease, lung disease, and many forms of cancer. Recommended strategies for quitting are covered, including working with your doctor to develop a successful plan.  Culinary   Becoming a Set designer conducted group or individual video education with verbal and written material and guidebook.  Patient learns that cooking at home can be healthy, cost-effective, quick, and puts them in control. Keys to cooking healthy recipes will include looking at your recipe, assessing your equipment needs, planning  ahead, making it simple, choosing cost-effective seasonal ingredients, and limiting the use of added fats, salts, and sugars.  Cooking - Breakfast and Snacks  Clinical staff conducted group or individual video education  with verbal and written material and guidebook.  Patient learns how important breakfast is to satiety and nutrition through the entire day. Recommendations include key foods to eat during breakfast to help stabilize blood sugar levels and to prevent overeating at meals later in the day. Planning ahead is also a key component.  Cooking - Educational psychologist conducted group or individual video education with verbal and written material and guidebook.  Patient learns eating strategies to improve overall health, including an approach to cook more at home. Recommendations include thinking of animal protein as a side on your plate rather than center stage and focusing instead on lower calorie dense options like vegetables, fruits, whole grains, and plant-based proteins, such as beans. Making sauces in large quantities to freeze for later and leaving the skin on your vegetables are also recommended to maximize your experience.  Cooking - Healthy Salads and Dressing Clinical staff conducted group or individual video education with verbal and written material and guidebook.  Patient learns that vegetables, fruits, whole grains, and legumes are the foundations of the Pritikin Eating Plan. Recommendations include how to incorporate each of these in flavorful and healthy salads, and how to create homemade salad dressings. Proper handling of ingredients is also covered. Cooking - Soups and [employer] - Soups and Desserts Clinical staff conducted group or individual video education with verbal and written material and guidebook.  Patient learns that Pritikin soups and desserts make for easy, nutritious, and delicious snacks and meal components that are low in sodium, fat, sugar, and calorie density, while high in vitamins, minerals, and filling fiber. Recommendations include simple and healthy ideas for soups and desserts.   Overview     The Pritikin Solution Program Overview Clinical staff  conducted group or individual video education with verbal and written material and guidebook.  Patient learns that the results of the Pritikin Program have been documented in more than 100 articles published in peer-reviewed journals, and the benefits include reducing risk factors for (and, in some cases, even reversing) high cholesterol, high blood pressure, type 2 diabetes, obesity, and more! An overview of the three key pillars of the Pritikin Program will be covered: eating well, doing regular exercise, and having a healthy mind-set.  WORKSHOPS  Exercise: Exercise Basics: Building Your Action Plan Clinical staff led group instruction and group discussion with PowerPoint presentation and patient guidebook. To enhance the learning environment the use of posters, models and videos may be added. At the conclusion of this workshop, patients will comprehend the difference between physical activity and exercise, as well as the benefits of incorporating both, into their routine. Patients will understand the FITT (Frequency, Intensity, Time, and Type) principle and how to use it to build an exercise action plan. In addition, safety concerns and other considerations for exercise and cardiac rehab will be addressed by the presenter. The purpose of this lesson is to promote a comprehensive and effective weekly exercise routine in order to improve patients' overall level of fitness.   Managing Heart Disease: Your Path to a Healthier Heart Clinical staff led group instruction and group discussion with PowerPoint presentation and patient guidebook. To enhance the learning environment the use of posters, models and videos may be added.At the conclusion of this workshop, patients will understand  the anatomy and physiology of the heart. Additionally, they will understand how Pritikin's three pillars impact the risk factors, the progression, and the management of heart disease.  The purpose of this lesson is to  provide a high-level overview of the heart, heart disease, and how the Pritikin lifestyle positively impacts risk factors.  Exercise Biomechanics Clinical staff led group instruction and group discussion with PowerPoint presentation and patient guidebook. To enhance the learning environment the use of posters, models and videos may be added. Patients will learn how the structural parts of their bodies function and how these functions impact their daily activities, movement, and exercise. Patients will learn how to promote a neutral spine, learn how to manage pain, and identify ways to improve their physical movement in order to promote healthy living. The purpose of this lesson is to expose patients to common physical limitations that impact physical activity. Participants will learn practical ways to adapt and manage aches and pains, and to minimize their effect on regular exercise. Patients will learn how to maintain good posture while sitting, walking, and lifting.  Balance Training and Fall Prevention  Clinical staff led group instruction and group discussion with PowerPoint presentation and patient guidebook. To enhance the learning environment the use of posters, models and videos may be added. At the conclusion of this workshop, patients will understand the importance of their sensorimotor skills (vision, proprioception, and the vestibular system) in maintaining their ability to balance as they age. Patients will apply a variety of balancing exercises that are appropriate for their current level of function. Patients will understand the common causes for poor balance, possible solutions to these problems, and ways to modify their physical environment in order to minimize their fall risk. The purpose of this lesson is to teach patients about the importance of maintaining balance as they age and ways to minimize their risk of falling.  WORKSHOPS   Nutrition:  Fueling a Ship broker led group instruction and group discussion with PowerPoint presentation and patient guidebook. To enhance the learning environment the use of posters, models and videos may be added. Patients will review the foundational principles of the Pritikin Eating Plan and understand what constitutes a serving size in each of the food groups. Patients will also learn Pritikin-friendly foods that are better choices when away from home and review make-ahead meal and snack options. Calorie density will be reviewed and applied to three nutrition priorities: weight maintenance, weight loss, and weight gain. The purpose of this lesson is to reinforce (in a group setting) the key concepts around what patients are recommended to eat and how to apply these guidelines when away from home by planning and selecting Pritikin-friendly options. Patients will understand how calorie density may be adjusted for different weight management goals.  Mindful Eating  Clinical staff led group instruction and group discussion with PowerPoint presentation and patient guidebook. To enhance the learning environment the use of posters, models and videos may be added. Patients will briefly review the concepts of the Pritikin Eating Plan and the importance of low-calorie dense foods. The concept of mindful eating will be introduced as well as the importance of paying attention to internal hunger signals. Triggers for non-hunger eating and techniques for dealing with triggers will be explored. The purpose of this lesson is to provide patients with the opportunity to review the basic principles of the Pritikin Eating Plan, discuss the value of eating mindfully and how to measure internal cues of hunger and fullness  using the Hunger Scale. Patients will also discuss reasons for non-hunger eating and learn strategies to use for controlling emotional eating.  Targeting Your Nutrition Priorities Clinical staff led group instruction  and group discussion with PowerPoint presentation and patient guidebook. To enhance the learning environment the use of posters, models and videos may be added. Patients will learn how to determine their genetic susceptibility to disease by reviewing their family history. Patients will gain insight into the importance of diet as part of an overall healthy lifestyle in mitigating the impact of genetics and other environmental insults. The purpose of this lesson is to provide patients with the opportunity to assess their personal nutrition priorities by looking at their family history, their own health history and current risk factors. Patients will also be able to discuss ways of prioritizing and modifying the Pritikin Eating Plan for their highest risk areas  Menu  Clinical staff led group instruction and group discussion with PowerPoint presentation and patient guidebook. To enhance the learning environment the use of posters, models and videos may be added. Using menus brought in from [employer], or printed from [employer], patients will apply the Pritikin dining out guidelines that were presented in the [employer] video. Patients will also be able to practice these guidelines in a variety of provided scenarios. The purpose of this lesson is to provide patients with the opportunity to practice hands-on learning of the Pritikin Dining Out guidelines with actual menus and practice scenarios.  Label Reading Clinical staff led group instruction and group discussion with PowerPoint presentation and patient guidebook. To enhance the learning environment the use of posters, models and videos may be added. Patients will review and discuss the Pritikin label reading guidelines presented in [employer]'s Label Reading Educational series video. Using fool labels brought in from local grocery stores and markets, patients will apply the label reading guidelines and determine if the packaged  food meet the Pritikin guidelines. The purpose of this lesson is to provide patients with the opportunity to review, discuss, and practice hands-on learning of the Pritikin Label Reading guidelines with actual packaged food labels. Cooking School  [employer]'s [employer] are designed to teach patients ways to prepare quick, simple, and affordable recipes at home. The importance of nutrition's role in chronic disease risk reduction is reflected in its emphasis in the overall Pritikin program. By learning how to prepare essential core Pritikin Eating Plan recipes, patients will increase control over what they eat; be able to customize the flavor of foods without the use of added salt, sugar, or fat; and improve the quality of the food they consume. By learning a set of core recipes which are easily assembled, quickly prepared, and affordable, patients are more likely to prepare more healthy foods at home. These workshops focus on convenient breakfasts, simple entres, side dishes, and desserts which can be prepared with minimal effort and are consistent with nutrition recommendations for cardiovascular risk reduction. Cooking [employer] are taught by a Armed forces logistics/support/administrative officer (RD) who has been trained by the [employer]. The chef or RD has a clear understanding of the importance of minimizing - if not completely eliminating - added fat, sugar, and sodium in recipes. Throughout the series of Cooking School Workshop sessions, patients will learn about healthy ingredients and efficient methods of cooking to build confidence in their capability to prepare    [employer] weekly topics:  Adding Flavor- Sodium-Free  Fast and Healthy Breakfasts  Powerhouse  Plant-Based Proteins  Satisfying Salads and Dressings  Simple Sides and Sauces  International Cuisine-Spotlight on the Blue Zones  Delicious Desserts  Savory Soups  Efficiency Cooking - Meals in a Snap  Tasty  Appetizers and Snacks  Comforting Weekend Breakfasts  One-Pot Wonders   Fast Evening Meals  Landscape architect Your Pritikin Plate  WORKSHOPS   Healthy Mindset (Psychosocial):  Focused Goals, Sustainable Changes Clinical staff led group instruction and group discussion with PowerPoint presentation and patient guidebook. To enhance the learning environment the use of posters, models and videos may be added. Patients will be able to apply effective goal setting strategies to establish at least one personal goal, and then take consistent, meaningful action toward that goal. They will learn to identify common barriers to achieving personal goals and develop strategies to overcome them. Patients will also gain an understanding of how our mind-set can impact our ability to achieve goals and the importance of cultivating a positive and growth-oriented mind-set. The purpose of this lesson is to provide patients with a deeper understanding of how to set and achieve personal goals, as well as the tools and strategies needed to overcome common obstacles which may arise along the way.  From Head to Heart: The Power of a Healthy Outlook  Clinical staff led group instruction and group discussion with PowerPoint presentation and patient guidebook. To enhance the learning environment the use of posters, models and videos may be added. Patients will be able to recognize and describe the impact of emotions and mood on physical health. They will discover the importance of self-care and explore self-care practices which may work for them. Patients will also learn how to utilize the 4 C's to cultivate a healthier outlook and better manage stress and challenges. The purpose of this lesson is to demonstrate to patients how a healthy outlook is an essential part of maintaining good health, especially as they continue their cardiac rehab journey.  Healthy Sleep for a Healthy Heart Clinical staff led group  instruction and group discussion with PowerPoint presentation and patient guidebook. To enhance the learning environment the use of posters, models and videos may be added. At the conclusion of this workshop, patients will be able to demonstrate knowledge of the importance of sleep to overall health, well-being, and quality of life. They will understand the symptoms of, and treatments for, common sleep disorders. Patients will also be able to identify daytime and nighttime behaviors which impact sleep, and they will be able to apply these tools to help manage sleep-related challenges. The purpose of this lesson is to provide patients with a general overview of sleep and outline the importance of quality sleep. Patients will learn about a few of the most common sleep disorders. Patients will also be introduced to the concept of "sleep hygiene," and discover ways to self-manage certain sleeping problems through simple daily behavior changes. Finally, the workshop will motivate patients by clarifying the links between quality sleep and their goals of heart-healthy living.   Recognizing and Reducing Stress Clinical staff led group instruction and group discussion with PowerPoint presentation and patient guidebook. To enhance the learning environment the use of posters, models and videos may be added. At the conclusion of this workshop, patients will be able to understand the types of stress reactions, differentiate between acute and chronic stress, and recognize the impact that chronic stress has on their health. They will also be able to apply different coping mechanisms, such as reframing negative self-talk. Patients will  have the opportunity to practice a variety of stress management techniques, such as deep abdominal breathing, progressive muscle relaxation, and/or guided imagery.  The purpose of this lesson is to educate patients on the role of stress in their lives and to provide healthy techniques for coping  with it.  Learning Barriers/Preferences:  Learning Barriers/Preferences - 05/28/23 1410       Learning Barriers/Preferences   Learning Barriers Sight   glasses   Learning Preferences Group Instruction;Individual Instruction;Skilled Demonstration;Verbal Instruction;Video;Written Material          Education Topics:  Knowledge Questionnaire Score:  Knowledge Questionnaire Score - 05/28/23 1410       Knowledge Questionnaire Score   Pre Score 23/24          Core Components/Risk Factors/Patient Goals at Admission:  Personal Goals and Risk Factors at Admission - 05/28/23 1410       Core Components/Risk Factors/Patient Goals on Admission    Weight Management Yes;Obesity    Intervention Weight Management: Develop a combined nutrition and exercise program designed to reach desired caloric intake, while maintaining appropriate intake of nutrient and fiber, sodium and fats, and appropriate energy expenditure required for the weight goal.;Weight Management: Provide education and appropriate resources to help participant work on and attain dietary goals.;Weight Management/Obesity: Establish reasonable short term and long term weight goals.;Obesity: Provide education and appropriate resources to help participant work on and attain dietary goals.    Expected Outcomes Short Term: Continue to assess and modify interventions until short term weight is achieved;Long Term: Adherence to nutrition and physical activity/exercise program aimed toward attainment of established weight goal;Understanding recommendations for meals to include 15-35% energy as protein, 25-35% energy from fat, 35-60% energy from carbohydrates, less than 200mg  of dietary cholesterol, 20-35 gm of total fiber daily;Understanding of distribution of calorie intake throughout the day with the consumption of 4-5 meals/snacks    Heart Failure Yes    Intervention Provide a combined exercise and nutrition program that is supplemented with  education, support and counseling about heart failure. Directed toward relieving symptoms such as shortness of breath, decreased exercise tolerance, and extremity edema.    Expected Outcomes Improve functional capacity of life;Short term: Attendance in program 2-3 days a week with increased exercise capacity. Reported lower sodium intake. Reported increased fruit and vegetable intake. Reports medication compliance.;Short term: Daily weights obtained and reported for increase. Utilizing diuretic protocols set by physician.;Long term: Adoption of self-care skills and reduction of barriers for early signs and symptoms recognition and intervention leading to self-care maintenance.    Hypertension Yes    Intervention Provide education on lifestyle modifcations including regular physical activity/exercise, weight management, moderate sodium restriction and increased consumption of fresh fruit, vegetables, and low fat dairy, alcohol moderation, and smoking cessation.;Monitor prescription use compliance.    Expected Outcomes Short Term: Continued assessment and intervention until BP is < 140/65mm HG in hypertensive participants. < 130/16mm HG in hypertensive participants with diabetes, heart failure or chronic kidney disease.;Long Term: Maintenance of blood pressure at goal levels.    Lipids Yes    Intervention Provide education and support for participant on nutrition & aerobic/resistive exercise along with prescribed medications to achieve LDL 70mg , HDL >40mg .    Expected Outcomes Short Term: Participant states understanding of desired cholesterol values and is compliant with medications prescribed. Participant is following exercise prescription and nutrition guidelines.;Long Term: Cholesterol controlled with medications as prescribed, with individualized exercise RX and with personalized nutrition plan. Value goals: LDL < 70mg , HDL > 40  mg.          Core Components/Risk Factors/Patient Goals Review:    Goals and Risk Factor Review     Row Name 06/11/23 1442 06/24/23 1745 07/22/23 1140         Core Components/Risk Factors/Patient Goals Review   Personal Goals Review Weight Management/Obesity;Hypertension;Lipids;Heart Failure Weight Management/Obesity;Hypertension;Lipids;Heart Failure Weight Management/Obesity;Hypertension;Lipids;Heart Failure     Review Vastie started cardiac rehab on 06/11/23. Grayce did well with exercise. Vital signs were stable.Luan is back on her  corlanor . Heart rate improved today. Mayrani is doing well with exercise. Systolic resting BP's have been in the 90's. Asymptomatic. Will continue to monitor. Nayelli has increased her met level Asani has been absent since 06/30/23.     Expected Outcomes Grayce will continue to participate in cardiac rehab for exercise, nutrition and lifestyle modifications Grayce will continue to participate in cardiac rehab for exercise, nutrition and lifestyle modifications Grayce will continue to participate in cardiac rehab for exercise, nutrition and lifestyle modifications upon return to exercise.        Core Components/Risk Factors/Patient Goals at Discharge (Final Review):   Goals and Risk Factor Review - 07/22/23 1140       Core Components/Risk Factors/Patient Goals Review   Personal Goals Review Weight Management/Obesity;Hypertension;Lipids;Heart Failure    Review Risha has been absent since 06/30/23.    Expected Outcomes Grayce will continue to participate in cardiac rehab for exercise, nutrition and lifestyle modifications upon return to exercise.          ITP Comments:  ITP Comments     Row Name 05/28/23 1402 06/11/23 1440 06/24/23 1741 07/22/23 1131     ITP Comments Dr. Wilbert Bihari medical director. Introduction to pritikin education/intensive cardiac rehab. Initial orientation packet reviewed with patient. 30 Day Robin started cardiac rehab on 06/11/23. Heart rate was improved after restarting corlanor . Grayce did well with  exercise. 30 Day Grayce has good  attendance and participation with exercise at cardiac rehab 30 Day  ITP Review. Grayce has been absent since 07/07/23. Robin plans to return to exercise on 07/23/23       Comments: See ITP comments.Hadassah Elpidio Quan RN BSN

## 2023-07-23 ENCOUNTER — Encounter (HOSPITAL_COMMUNITY): Admission: RE | Admit: 2023-07-23 | Source: Ambulatory Visit

## 2023-07-25 ENCOUNTER — Inpatient Hospital Stay: Attending: Internal Medicine

## 2023-07-25 ENCOUNTER — Encounter (HOSPITAL_COMMUNITY): Admission: RE | Admit: 2023-07-25 | Source: Ambulatory Visit

## 2023-07-25 ENCOUNTER — Telehealth (HOSPITAL_COMMUNITY): Payer: Self-pay | Admitting: *Deleted

## 2023-07-25 DIAGNOSIS — Z95828 Presence of other vascular implants and grafts: Secondary | ICD-10-CM

## 2023-07-25 DIAGNOSIS — Z452 Encounter for adjustment and management of vascular access device: Secondary | ICD-10-CM | POA: Diagnosis not present

## 2023-07-25 DIAGNOSIS — Z8572 Personal history of non-Hodgkin lymphomas: Secondary | ICD-10-CM | POA: Diagnosis present

## 2023-07-25 MED ORDER — SODIUM CHLORIDE 0.9% FLUSH
10.0000 mL | INTRAVENOUS | Status: DC | PRN
  Administered 2023-07-25: 10 mL

## 2023-07-25 MED ORDER — HEPARIN SOD (PORK) LOCK FLUSH 100 UNIT/ML IV SOLN
500.0000 [IU] | Freq: Once | INTRAVENOUS | Status: AC | PRN
  Administered 2023-07-25: 500 [IU]

## 2023-07-25 NOTE — Telephone Encounter (Signed)
 Natalie Baker says she has not been feeling well having aches and pains. Natalie Baker hopes to return to exercise next week.Hadassah Elpidio Quan RN BSN

## 2023-07-27 ENCOUNTER — Other Ambulatory Visit: Payer: Self-pay | Admitting: Family

## 2023-07-27 DIAGNOSIS — E039 Hypothyroidism, unspecified: Secondary | ICD-10-CM

## 2023-07-28 ENCOUNTER — Encounter (HOSPITAL_COMMUNITY): Admission: RE | Admit: 2023-07-28 | Source: Ambulatory Visit

## 2023-07-30 ENCOUNTER — Encounter (HOSPITAL_COMMUNITY)
Admission: RE | Admit: 2023-07-30 | Discharge: 2023-07-30 | Disposition: A | Discharge status: Home / Self Care | Source: Ambulatory Visit | Attending: Internal Medicine

## 2023-07-30 DIAGNOSIS — I5022 Chronic systolic (congestive) heart failure: Secondary | ICD-10-CM | POA: Diagnosis present

## 2023-07-30 NOTE — Progress Notes (Signed)
 Patient here for cardiac rehab after being absent for several sessions. Telemetry rhythm Sinus with run of frequent PVC's, trigeminy and questionable 6 beat run of PAT. Patient has history of PVC's. Asymptomatic. Taking medications as prescribed. Will show today's ECG tracings to onsite provider Rosaline Bane NP. Onsite provider Rosaline Bane NP reviewed today's ECG tracings. Okay to proceed with exercise.Will continue to monitor the patient throughout  the program. Hadassah Elpidio Quan RN BSN

## 2023-08-01 ENCOUNTER — Encounter (HOSPITAL_COMMUNITY): Admission: RE | Admit: 2023-08-01 | Source: Ambulatory Visit

## 2023-08-03 ENCOUNTER — Other Ambulatory Visit: Payer: Self-pay | Admitting: Family Medicine

## 2023-08-03 ENCOUNTER — Other Ambulatory Visit (HOSPITAL_COMMUNITY): Payer: Self-pay | Admitting: Internal Medicine

## 2023-08-04 ENCOUNTER — Encounter (HOSPITAL_COMMUNITY)
Admission: RE | Admit: 2023-08-04 | Discharge: 2023-08-04 | Disposition: A | Discharge status: Home / Self Care | Source: Ambulatory Visit | Attending: Internal Medicine

## 2023-08-04 ENCOUNTER — Telehealth: Payer: Self-pay

## 2023-08-04 DIAGNOSIS — I5022 Chronic systolic (congestive) heart failure: Secondary | ICD-10-CM | POA: Diagnosis not present

## 2023-08-04 NOTE — Telephone Encounter (Signed)
 Pt has intolerance to multiple statins. Per last OV, pt will continue efforts with diet and exercise only for now.

## 2023-08-04 NOTE — Telephone Encounter (Signed)
 Spoke with Shona from [facility], where the patient received cellular therapy. She inquired whether the patient needs to continue taking Bactrim  and Valtrex , noting that these medications are typically prescribed post-cellular therapy due to immunosuppression. Shona also mentioned if our office could check CD4 labs to help determine the need for continued use of Bactrim  and Valtrex . Informed her that the message will be relayed to Dr. Sherrod for review.

## 2023-08-05 NOTE — Progress Notes (Signed)
Reviewed home exercise Rx with patient today.  Encouraged warm-up, cool-down, and stretching. Reviewed THRR of 61-122 and keeping RPE between 11-13. Encouraged to hydrate with activity.  Reviewed weather parameters for temperature and humidity for safe exercise outdoors. Reviewed S/S to terminate exercise and when to call 911 vs MD. Pt encouraged to always carry a cell phone for safety when exercising outdoors. Pt verbalized understanding of the home exercise Rx and was provided a copy.   Lorin Picket MS, ACSM-CEP, CCRP

## 2023-08-06 ENCOUNTER — Encounter (HOSPITAL_COMMUNITY): Admission: RE | Admit: 2023-08-06 | Source: Ambulatory Visit

## 2023-08-08 ENCOUNTER — Encounter (HOSPITAL_COMMUNITY)
Admission: RE | Admit: 2023-08-08 | Discharge: 2023-08-08 | Disposition: A | Discharge status: Home / Self Care | Source: Ambulatory Visit | Attending: Internal Medicine | Admitting: Internal Medicine

## 2023-08-08 DIAGNOSIS — I5022 Chronic systolic (congestive) heart failure: Secondary | ICD-10-CM | POA: Diagnosis present

## 2023-08-11 ENCOUNTER — Encounter (HOSPITAL_COMMUNITY)
Admission: RE | Admit: 2023-08-11 | Discharge: 2023-08-11 | Disposition: A | Discharge status: Home / Self Care | Source: Ambulatory Visit | Attending: Internal Medicine | Admitting: Internal Medicine

## 2023-08-11 DIAGNOSIS — I5022 Chronic systolic (congestive) heart failure: Secondary | ICD-10-CM | POA: Diagnosis not present

## 2023-08-13 ENCOUNTER — Encounter (HOSPITAL_COMMUNITY): Admission: RE | Admit: 2023-08-13 | Source: Ambulatory Visit

## 2023-08-14 ENCOUNTER — Encounter: Payer: Self-pay | Admitting: Internal Medicine

## 2023-08-15 ENCOUNTER — Encounter (HOSPITAL_COMMUNITY)
Admission: RE | Admit: 2023-08-15 | Discharge: 2023-08-15 | Disposition: A | Discharge status: Home / Self Care | Source: Ambulatory Visit | Attending: Internal Medicine | Admitting: Internal Medicine

## 2023-08-15 NOTE — Progress Notes (Signed)
 Cardiac Individual Treatment Plan  Patient Details  Name: Natalie Baker MRN: [id number] Date of Birth: [birth year] Referring Provider:   Flowsheet Row INTENSIVE CARDIAC REHAB ORIENT from 05/28/2023 in [facility]  Referring Provider Toribio Fuel, MD    Initial Encounter Date:  Flowsheet Row INTENSIVE CARDIAC REHAB ORIENT from 05/28/2023 in [facility]  Date 05/28/23    Visit Diagnosis: Heart failure, chronic systolic (HCC)  Patient's Home Medications on Admission:  Current Outpatient Medications:    amiodarone  (PACERONE ) 200 MG tablet, TAKE 1/2 TABLET BY MOUTH DAILY, Disp: 45 tablet, Rfl: 3   Cholecalciferol  (VITAMIN D -3) 25 MCG (1000 UT) CAPS, Take 1 capsule by mouth daily., Disp: , Rfl:    ELIQUIS  5 MG TABS tablet, TAKE 1 TABLET BY MOUTH TWICE A DAY, Disp: 60 tablet, Rfl: 6   estradiol  (VIVELLE -DOT) 0.05 MG/24HR patch, Place 1 patch onto the skin 2 (two) times a week. Tuesdays & Fridays, Disp: , Rfl:    FARXIGA  10 MG TABS tablet, TAKE 1 TABLET BY MOUTH EVERY DAY, Disp: 30 tablet, Rfl: 11   ivabradine  (CORLANOR ) 7.5 MG TABS tablet, Take 1 tablet (7.5 mg total) by mouth 2 (two) times daily with a meal., Disp: 60 tablet, Rfl: 11   levothyroxine  (SYNTHROID ) 100 MCG tablet, TAKE 1 TABLET BY MOUTH DAILY ON AN EMPTY STOMACH WITH ONLY WATER FOR 30 MINUTES & NO ANTACID/CALCIUM /MAGNESIUM  FOR 4 HOURS & AVOID BIOTIN, Disp: 90 tablet, Rfl: 0   losartan  (COZAAR ) 25 MG tablet, Take 0.5 tablets (12.5 mg total) by mouth daily., Disp: 45 tablet, Rfl: 3   ondansetron  (ZOFRAN -ODT) 4 MG disintegrating tablet, Take 1 tablet (4 mg total) by mouth every 8 (eight) hours as needed for nausea or vomiting., Disp: 30 tablet, Rfl: 3   potassium chloride  (KLOR-CON ) 10 MEQ tablet, 2 tabs po qd, Disp: , Rfl:    pravastatin  (PRAVACHOL ) 20 MG tablet, Take 1 tablet (20 mg total) by mouth daily.  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 [facility] and others   Obesity    BMI 34   Osteoporosis    most recent DEXA 02/2022   Pre-diabetes    Secondary nonischemic congestive cardiomyopathy (HCC)    Adriamycin  2020   Ventricular tachycardia (HCC)    Vitamin D  deficiency     Tobacco Use: Social History   Tobacco Use  Smoking Status Former   Current packs/day: 0.00   Types: Cigarettes   Start date: 01/07/1981   Quit date: 01/08/1996   Years since quitting: 27.6  Smokeless Tobacco Never  Tobacco Comments  quit 1998 but is exposed to 2nd hand smoke    Labs: Review Flowsheet  More data exists      Latest Ref Rng & Units 03/18/2021 03/19/2021 09/05/2021 12/13/2022 07/07/2023  Labs for ITP Cardiac and Pulmonary Rehab  Cholestrol <200 mg/dL - - 749  745  -  LDL (calc) mg/dL (calc) - - 844  834  -  HDL-C > OR = 50 mg/dL - - 59  67  -  Trlycerides <150 mg/dL - - 802  892  -  Hemoglobin A1c 4.6 - 6.5 % - - 5.5  5.8  5.8   O2 Saturation % 59.6  60  - - -    Capillary Blood Glucose: Lab Results  Component Value Date   GLUCAP 100 (H) 10/31/2021   GLUCAP 85 09/25/2020   GLUCAP 101 (H) 09/21/2020   GLUCAP 117 (H) 06/26/2019    GLUCAP 104 (H) 06/10/2019     Exercise Target Goals: Exercise Program Goal: Individual exercise prescription set using results from initial 6 min walk test and THRR while considering  patient's activity barriers and safety.   Exercise Prescription Goal: Initial exercise prescription builds to 30-45 minutes a day of aerobic activity, 2-3 days per week.  Home exercise guidelines will be given to patient during program as part of exercise prescription that the participant will acknowledge.  Activity Barriers & Risk Stratification:  Activity Barriers & Cardiac Risk Stratification - 05/28/23 1409       Activity Barriers & Cardiac Risk Stratification   Activity Barriers Deconditioning;Decreased Ventricular Function;Balance Concerns    Cardiac Risk Stratification High   <5 METs on         6 Minute Walk:  6 Minute Walk     Row Name 05/28/23 1530         6 Minute Walk   Phase Initial     Distance 1385 feet     Walk Time 6 minutes     # of Rest Breaks 0     MPH 2.62     METS 2.93     RPE 8     Perceived Dyspnea  0     VO2 Peak 10.26     Symptoms No     Resting HR 64 bpm     Resting BP 112/68     Resting Oxygen Saturation  98 %     Exercise Oxygen Saturation  during 6 min walk 98 %     Max Ex. HR 121 bpm     Max Ex. BP 118/62     2 Minute Post BP 116/70        Oxygen Initial Assessment:   Oxygen Re-Evaluation:   Oxygen Discharge (Final Oxygen Re-Evaluation):   Initial Exercise Prescription:  Initial Exercise Prescription - 05/28/23 1500       Date of Initial Exercise RX and Referring Provider   Date 05/28/23    Referring Provider Toribio Fuel, MD    Expected Discharge Date 08/20/23      Recumbant Bike   Level 1    RPM 55    Watts 20    Minutes 15    METs 2      Track   Laps 15    Minutes 15    METs 2      Prescription Details   Frequency (times per week) 3    Duration Progress to 30 minutes of continuous aerobic without  signs/symptoms of physical distress  Intensity   THRR 40-80% of Max Heartrate 61-122    Ratings of Perceived Exertion 11-13    Perceived Dyspnea 0-4      Progression   Progression Continue progressive overload as per policy without signs/symptoms or physical distress.      Resistance Training   Training Prescription Yes    Weight 2    Reps 10-15          Perform Capillary Blood Glucose checks as needed.  Exercise Prescription Changes:   Exercise Prescription Changes     Row Name 06/11/23 1400 06/30/23 1500 07/30/23 1500 08/04/23 1500       Response to Exercise   Blood Pressure (Admit) 112/62 90/60 118/72 106/60    Blood Pressure (Exercise) 122/62 112/60 108/70 116/72    Blood Pressure (Exit) 112/72 108/68 108/54 98/70    Heart Rate (Admit) 70 bpm 77 bpm 64 bpm 81 bpm    Heart Rate (Exercise) 104 bpm 103 bpm 97 bpm 109 bpm    Heart Rate (Exit) 81 bpm 83 bpm 73 bpm 87 bpm    Rating of Perceived Exertion (Exercise) 10 10 11 9     Symptoms None None None None    Comments Pt's first day in the [facility] program Reviewed METs Reviewed METs and goals Reviewed home exercise Rx    Duration Continue with 30 min of aerobic exercise without signs/symptoms of physical distress. Continue with 30 min of aerobic exercise without signs/symptoms of physical distress. Continue with 30 min of aerobic exercise without signs/symptoms of physical distress. Continue with 30 min of aerobic exercise without signs/symptoms of physical distress.    Intensity THRR unchanged THRR unchanged THRR unchanged THRR unchanged      Progression   Progression Continue to progress workloads to maintain intensity without signs/symptoms of physical distress. Continue to progress workloads to maintain intensity without signs/symptoms of physical distress. Continue to progress workloads to maintain intensity without signs/symptoms of physical distress. Continue to progress workloads to maintain intensity without  signs/symptoms of physical distress.    Average METs 2.4 2.5 2.5 2.6      Resistance Training   Training Prescription No Yes No No    Weight No weights on wednesdays 2 lbs No weights on wednesdays 2 lbs    Reps -- 10-15 -- 10-15    Time -- 5 Minutes -- 5 Minutes      Interval Training   Interval Training No No No No      Recumbant Bike   Level 1 2 2 2     RPM 68 54 72 75    Watts 14 12 14 13     Minutes 15 15 15 15     METs 2 1.9 2.1 2      Track   Laps 14 16 15 16     Minutes 15 15 15 15     METs 2.79 3.04 2.91 3.04      Home Exercise Plan   Plans to continue exercise at -- -- -- Home (comment)    Frequency -- -- -- Add 2 additional days to program exercise sessions.    Initial Home Exercises Provided -- -- -- 08/04/23       Exercise Comments:   Exercise Comments     Row Name 06/11/23 1420 06/30/23 1559 07/14/23 0928 07/30/23 1500 08/04/23 1500   Exercise Comments Pt's first day in the CRP2 program. Pt exercised without complaints. Reviewed METs with patient today. Pt is making progress. No changes to workloads today. Pt due  for review METs and goals but did not attend today. will complete upon her return. Reviewed METs and goals. Pt has not attended regularly due to not feeling well. Last time pt attended was 06/30/23. Reviewed home exercise Rx with patient. Pt is trying to walk at home inside daily. This depends on how the patient is feeling as she has had issues with low blood pressure recently. The goal is to try to get 30 minutes of walking 2x/week. Discuassed breaking walks up into bouts of 10 to 15 minutes. Pt verbalized understanding of the home exercise Rx and was provided a copy.      Exercise Goals and Review:   Exercise Goals     Row Name 05/28/23 1410             Exercise Goals   Increase Physical Activity Yes       Intervention Provide advice, education, support and counseling about physical activity/exercise needs.;Develop an individualized exercise  prescription for aerobic and resistive training based on initial evaluation findings, risk stratification, comorbidities and participant's personal goals.       Expected Outcomes Short Term: Attend rehab on a regular basis to increase amount of physical activity.;Long Term: Exercising regularly at least 3-5 days a week.;Long Term: Add in home exercise to make exercise part of routine and to increase amount of physical activity.       Increase Strength and Stamina Yes       Intervention Provide advice, education, support and counseling about physical activity/exercise needs.;Develop an individualized exercise prescription for aerobic and resistive training based on initial evaluation findings, risk stratification, comorbidities and participant's personal goals.       Expected Outcomes Short Term: Increase workloads from initial exercise prescription for resistance, speed, and METs.;Short Term: Perform resistance training exercises routinely during rehab and add in resistance training at home;Long Term: Improve cardiorespiratory fitness, muscular endurance and strength as measured by increased METs and functional capacity ( )       Able to understand and use rate of perceived exertion (RPE) scale Yes       Intervention Provide education and explanation on how to use RPE scale       Expected Outcomes Short Term: Able to use RPE daily in rehab to express subjective intensity level;Long Term:  Able to use RPE to guide intensity level when exercising independently       Knowledge and understanding of Target Heart Rate Range (THRR) Yes       Intervention Provide education and explanation of THRR including how the numbers were predicted and where they are located for reference       Expected Outcomes Short Term: Able to state/look up THRR;Short Term: Able to use daily as guideline for intensity in rehab;Long Term: Able to use THRR to govern intensity when exercising independently       Understanding of  Exercise Prescription Yes       Intervention Provide education, explanation, and written materials on patient's individual exercise prescription       Expected Outcomes Short Term: Able to explain program exercise prescription;Long Term: Able to explain home exercise prescription to exercise independently          Exercise Goals Re-Evaluation :  Exercise Goals Re-Evaluation     Row Name 06/11/23 1419 07/30/23 1500           Exercise Goal Re-Evaluation   Exercise Goals Review Increase Physical Activity;Understanding of Exercise Prescription;Increase Strength and Stamina;Knowledge and understanding of Target Heart Rate  Range (THRR);Able to understand and use rate of perceived exertion (RPE) scale Increase Physical Activity;Understanding of Exercise Prescription;Increase Strength and Stamina;Knowledge and understanding of Target Heart Rate Range (THRR);Able to understand and use rate of perceived exertion (RPE) scale      Comments Pt's first day in the CRP2 program. Pt understands the exercise Rx, THRR and RPE scale. Reviewed METs and goals. Pt voices a little bit of change on her goal of increased strength and stamina. Pt attendance has been sporadic due to feeling unwell. Pt does voice progress on her goal of increasing her knowledge of nutrition.      Expected Outcomes Will continue to monitor patient and progress exercise workloads as tolerated. Will continue to monitor patient and progress exercise workloads as tolerated.         Discharge Exercise Prescription (Final Exercise Prescription Changes):  Exercise Prescription Changes - 08/04/23 1500       Response to Exercise   Blood Pressure (Admit) 106/60    Blood Pressure (Exercise) 116/72    Blood Pressure (Exit) 98/70    Heart Rate (Admit) 81 bpm    Heart Rate (Exercise) 109 bpm    Heart Rate (Exit) 87 bpm    Rating of Perceived Exertion (Exercise) 9    Symptoms None    Comments Reviewed home exercise Rx    Duration Continue  with 30 min of aerobic exercise without signs/symptoms of physical distress.    Intensity THRR unchanged      Progression   Progression Continue to progress workloads to maintain intensity without signs/symptoms of physical distress.    Average METs 2.6      Resistance Training   Training Prescription No    Weight 2 lbs    Reps 10-15    Time 5 Minutes      Interval Training   Interval Training No      Recumbant Bike   Level 2    RPM 75    Watts 13    Minutes 15    METs 2      Track   Laps 16    Minutes 15    METs 3.04      Home Exercise Plan   Plans to continue exercise at Home (comment)    Frequency Add 2 additional days to program exercise sessions.    Initial Home Exercises Provided 08/04/23          Nutrition:  Target Goals: Understanding of nutrition guidelines, daily intake of sodium 1500mg , cholesterol 200mg , calories 30% from fat and 7% or less from saturated fats, daily to have 5 or more servings of fruits and vegetables.  Biometrics:  Pre Biometrics - 05/28/23 1404       Pre Biometrics   Waist Circumference 44 inches    Hip Circumference 47 inches    Waist to Hip Ratio 0.94 %    Triceps Skinfold 37 mm    % Body Fat 46.9 %    Grip Strength 10 kg    Flexibility 11 in    Single Leg Stand 5.73 seconds           Nutrition Therapy Plan and Nutrition Goals:  Nutrition Therapy & Goals - 08/08/23 1533       Nutrition Therapy   Diet Heart Healthy Diet      Personal Nutrition Goals   Nutrition Goal Patient to identify strategies for reducing cardiovascular risk by attending the [employer] education and nutrition series weekly.   goal not met.  Personal Goal #2 Patient to improve diet quality by using the plate method as a guide for meal planning to include lean protein/plant protein, fruits, vegetables, whole grains, nonfat dairy as part of a well-balanced diet.   goal in progress.   Comments Fizza has medical history of HTN, non hodgkins  lymphoma, mantle cell lymphoma diagnosed in 2020 and completed treatment 2023, systolic HF, HTN, hyperlipidemia, preDM. Attendance has been variable; she has attended 5 [employer] education sessions. She was referred to GI for unwanted weight loss and persistent abdominal pain at 05/01/23 oncology follow-up. She has maintained her weight since starting with our program. A1c remains in a prediabetic range. LDL is not at goal. Patient will benefit from participation in intensive cardiac rehab for nutrition, exercise, and lifestyle modification.      Intervention Plan   Intervention Prescribe, educate and counsel regarding individualized specific dietary modifications aiming towards targeted core components such as weight, hypertension, lipid management, diabetes, heart failure and other comorbidities.;Nutrition handout(s) given to patient.    Expected Outcomes Short Term Goal: Understand basic principles of dietary content, such as calories, fat, sodium, cholesterol and nutrients.;Long Term Goal: Adherence to prescribed nutrition plan.          Nutrition Assessments:  MEDIFICTS Score Key: >=70 Need to make dietary changes  40-70 Heart Healthy Diet <= 40 Therapeutic Level Cholesterol Diet    Picture Your Plate Scores: <59 Unhealthy dietary pattern with much room for improvement. 41-50 Dietary pattern unlikely to meet recommendations for good health and room for improvement. 51-60 More healthful dietary pattern, with some room for improvement.  >60 Healthy dietary pattern, although there may be some specific behaviors that could be improved.    Nutrition Goals Re-Evaluation:  Nutrition Goals Re-Evaluation     Row Name 06/05/23 1133 07/10/23 1337 08/08/23 1533         Goals   Current Weight 180 lb 1.9 oz (81.7 kg) 180 lb 5.4 oz (81.8 kg) 179 lb 7.3 oz (81.4 kg)     Comment A1c 5.8, LDL 165, cholesterol 254 A1c 5.8, GFR 49, LDL 165, cholesterol 254 A1c 5.8, GFR 49, LDL 165, cholesterol 254      Expected Outcome Roselynn has medical history of HTN, non hodgkins lymphoma, mantle cell lymphoma diagnosed in 2020 and completed treatment 2023, systolic HF, HTN, hyperlipidemia, preDM. She was referred to GI for unwanted weight loss and persistent abdominal pain at 05/01/23 oncology follow-up. A1c remains in a prediabetic range. LDL is not at goal. Patient will benefit from participation in intensive cardiac rehab for nutrition, exercise, and lifestyle modification. Darlette has medical history of HTN, non hodgkins lymphoma, mantle cell lymphoma diagnosed in 2020 and completed treatment 2023, systolic HF, HTN, hyperlipidemia, preDM. Attendance has been variable; she has attended 4 [facility] education sessions. She was referred to GI for unwanted weight loss and persistent abdominal pain at 05/01/23 oncology follow-up. She has maintained her weight since starting with our program. A1c remains in a prediabetic range. LDL is not at goal. Patient will benefit from participation in intensive cardiac rehab for nutrition, exercise, and lifestyle modification. Eyleen has medical history of HTN, non hodgkins lymphoma, mantle cell lymphoma diagnosed in 2020 and completed treatment 2023, systolic HF, HTN, hyperlipidemia, preDM. Attendance has been variable; she has attended 5 [facility] education sessions. She was referred to GI for unwanted weight loss and persistent abdominal pain at 05/01/23 oncology follow-up. She has maintained her weight since starting with our program. A1c remains in a prediabetic range.  LDL is not at goal. Patient will benefit from participation in intensive cardiac rehab for nutrition, exercise, and lifestyle modification.        Nutrition Goals Re-Evaluation:  Nutrition Goals Re-Evaluation     Row Name 06/05/23 1133 07/10/23 1337 08/08/23 1533         Goals   Current Weight 180 lb 1.9 oz (81.7 kg) 180 lb 5.4 oz (81.8 kg) 179 lb 7.3 oz (81.4 kg)     Comment A1c 5.8, LDL 165, cholesterol 254  J8r 5.8, GFR 49, LDL 165, cholesterol 254 A1c 5.8, GFR 49, LDL 165, cholesterol 254     Expected Outcome Norina has medical history of HTN, non hodgkins lymphoma, mantle cell lymphoma diagnosed in 2020 and completed treatment 2023, systolic HF, HTN, hyperlipidemia, preDM. She was referred to GI for unwanted weight loss and persistent abdominal pain at 05/01/23 oncology follow-up. A1c remains in a prediabetic range. LDL is not at goal. Patient will benefit from participation in intensive cardiac rehab for nutrition, exercise, and lifestyle modification. Fleeta has medical history of HTN, non hodgkins lymphoma, mantle cell lymphoma diagnosed in 2020 and completed treatment 2023, systolic HF, HTN, hyperlipidemia, preDM. Attendance has been variable; she has attended 4 [facility] education sessions. She was referred to GI for unwanted weight loss and persistent abdominal pain at 05/01/23 oncology follow-up. She has maintained her weight since starting with our program. A1c remains in a prediabetic range. LDL is not at goal. Patient will benefit from participation in intensive cardiac rehab for nutrition, exercise, and lifestyle modification. Berit has medical history of HTN, non hodgkins lymphoma, mantle cell lymphoma diagnosed in 2020 and completed treatment 2023, systolic HF, HTN, hyperlipidemia, preDM. Attendance has been variable; she has attended 5 [facility] education sessions. She was referred to GI for unwanted weight loss and persistent abdominal pain at 05/01/23 oncology follow-up. She has maintained her weight since starting with our program. A1c remains in a prediabetic range. LDL is not at goal. Patient will benefit from participation in intensive cardiac rehab for nutrition, exercise, and lifestyle modification.        Nutrition Goals Discharge (Final Nutrition Goals Re-Evaluation):  Nutrition Goals Re-Evaluation - 08/08/23 1533       Goals   Current Weight 179 lb 7.3 oz (81.4 kg)    Comment A1c 5.8,  GFR 49, LDL 165, cholesterol 254    Expected Outcome Haizley has medical history of HTN, non hodgkins lymphoma, mantle cell lymphoma diagnosed in 2020 and completed treatment 2023, systolic HF, HTN, hyperlipidemia, preDM. Attendance has been variable; she has attended 5 [facility] education sessions. She was referred to GI for unwanted weight loss and persistent abdominal pain at 05/01/23 oncology follow-up. She has maintained her weight since starting with our program. A1c remains in a prediabetic range. LDL is not at goal. Patient will benefit from participation in intensive cardiac rehab for nutrition, exercise, and lifestyle modification.          Psychosocial: Target Goals: Acknowledge presence or absence of significant depression and/or stress, maximize coping skills, provide positive support system. Participant is able to verbalize types and ability to use techniques and skills needed for reducing stress and depression.  Initial Review & Psychosocial Screening:  Initial Psych Review & Screening - 05/28/23 1410       Initial Review   Current issues with None Identified      Family Dynamics   Good Support System? Yes   husband     Barriers   Psychosocial barriers  to participate in program There are no identifiable barriers or psychosocial needs.      Screening Interventions   Interventions Encouraged to exercise;Provide feedback about the scores to participant    Expected Outcomes Short Term goal: Identification and review with participant of any Quality of Life or Depression concerns found by scoring the questionnaire.;Long Term goal: The participant improves quality of Life and PHQ9 Scores as seen by post scores and/or verbalization of changes          Quality of Life Scores:  Quality of Life - 05/28/23 1533       Quality of Life   Select Quality of Life      Quality of Life Scores   Health/Function Pre 23.14 %    Socioeconomic Pre 30 %    Psych/Spiritual Pre 28.14 %     Family Pre 30 %    GLOBAL Pre 26.48 %         Scores of 19 and below usually indicate a poorer quality of life in these areas.  A difference of  2-3 points is a clinically meaningful difference.  A difference of 2-3 points in the total score of the Quality of Life Index has been associated with significant improvement in overall quality of life, self-image, physical symptoms, and general health in studies assessing change in quality of life.  PHQ-9: Review Flowsheet  More data exists      07/07/2023 05/28/2023 04/07/2023 01/09/2023 12/12/2020  Depression screen PHQ 2/9  Decreased Interest 0 0 0 0 0  Down, Depressed, Hopeless 0 0 0 0 1  PHQ - 2 Score 0 0 0 0 1  Altered sleeping - 1 1 - -  Tired, decreased energy - 1 3 - -  Change in appetite - 0 2 - -  Feeling bad or failure about yourself  - 0 0 - -  Trouble concentrating - 0 0 - -  Moving slowly or fidgety/restless - 0 1 - -  Suicidal thoughts - 0 0 - -  PHQ-9 Score - 2 7 - -  Difficult doing work/chores - Not difficult at all Somewhat difficult - -   Interpretation of Total Score  Total Score Depression Severity:  1-4 = Minimal depression, 5-9 = Mild depression, 10-14 = Moderate depression, 15-19 = Moderately severe depression, 20-27 = Severe depression   Psychosocial Evaluation and Intervention:   Psychosocial Re-Evaluation:  Psychosocial Re-Evaluation     Row Name 06/11/23 1441 06/24/23 1742 07/22/23 1137 08/15/23 1305       Psychosocial Re-Evaluation   Current issues with None Identified Current Stress Concerns Current Stress Concerns Current Stress Concerns    Comments -- Devi says she has been having GI issues. Sonakshi has recently followed up with GI last day of exercise was on 06/30/23. Unable to reassess at this time last day of exercise was on 08/31/23. Unable to reassess at this time    Expected Outcomes -- Grayce will have controlled or decreased stressors upon completion of cardiac rehab Grayce will have controlled  or decreased stressors upon completion of cardiac rehab Grayce will have controlled or decreased stressors upon completion of cardiac rehab    Interventions Encouraged to attend Cardiac Rehabilitation for the exercise Encouraged to attend Cardiac Rehabilitation for the exercise Encouraged to attend Cardiac Rehabilitation for the exercise Encouraged to attend Cardiac Rehabilitation for the exercise    Continue Psychosocial Services  No Follow up required No Follow up required No Follow up required No Follow up required  Initial Review   Source of Stress Concerns -- Chronic Illness Chronic Illness Chronic Illness    Comments -- Will continue to monitor and offer support as needed Will continue to monitor and offer support as needed Will continue to monitor and offer support as needed       Psychosocial Discharge (Final Psychosocial Re-Evaluation):  Psychosocial Re-Evaluation - 08/15/23 1305       Psychosocial Re-Evaluation   Current issues with Current Stress Concerns    Comments last day of exercise was on 08/31/23. Unable to reassess at this time    Expected Outcomes Grayce will have controlled or decreased stressors upon completion of cardiac rehab    Interventions Encouraged to attend Cardiac Rehabilitation for the exercise    Continue Psychosocial Services  No Follow up required      Initial Review   Source of Stress Concerns Chronic Illness    Comments Will continue to monitor and offer support as needed          Vocational Rehabilitation: Provide vocational rehab assistance to qualifying candidates.   Vocational Rehab Evaluation & Intervention:  Vocational Rehab - 05/28/23 1410       Initial Vocational Rehab Evaluation & Intervention   Assessment shows need for Vocational Rehabilitation No   retired         Education: Education Goals: Education classes will be provided on a weekly basis, covering required topics. Participant will state understanding/return  demonstration of topics presented.    Education     Row Name 06/04/23 1600     Education   Cardiac Education Topics Pritikin   Orthoptist   Educator Dietitian   Weekly Topic [employer] Desserts   Instruction Review Code 1- Verbalizes Understanding   Class Start Time 1400   Class Stop Time 1437   Class Time Calculation (min) 37 min    Row Name 06/11/23 1500     Education   Cardiac Education Topics Pritikin   Secondary school teacher School   Educator Dietitian   Weekly Topic Efficiency Cooking - Meals in a Snap   Instruction Review Code 1- Verbalizes Understanding   Class Start Time 1400   Class Stop Time 1442   Class Time Calculation (min) 42 min    Row Name 06/16/23 1300     Education   Cardiac Education Topics Pritikin   Hospital doctor Education   General Education Hypertension and Heart Disease   Instruction Review Code 1- Verbalizes Understanding   Class Start Time 1355   Class Stop Time 1443   Class Time Calculation (min) 48 min    Row Name 06/18/23 1500     Education   Cardiac Education Topics Pritikin   Secondary school teacher School   Educator Dietitian   Weekly Topic One-Pot Wonders   Instruction Review Code 1- Verbalizes Understanding   Class Start Time 1400   Class Stop Time 1445   Class Time Calculation (min) 45 min    Row Name 06/30/23 1400     Education   Cardiac Education Topics --   Select --     Core Videos   Educator --   Select --   Exercise Education --   Instruction Review Code --    Row Name 07/30/23 1600     Education   Cardiac  Education Topics Pritikin   Customer service manager   Weekly Topic Tasty Appetizers and Snacks   Instruction Review Code 1- Verbalizes Understanding   Class Start Time 1145   Class Stop Time 1225   Class Time Calculation (min) 40 min     Row Name 08/04/23 1300     Education   Cardiac Education Topics --   Select --     Core Videos   Educator --   Select --   Psychosocial --   Instruction Review Code --      Core Videos: Exercise    Move It!  Clinical staff conducted group or individual video education with verbal and written material and guidebook.  Patient learns the recommended Pritikin exercise program. Exercise with the goal of living a long, healthy life. Some of the health benefits of exercise include controlled diabetes, healthier blood pressure levels, improved cholesterol levels, improved heart and lung capacity, improved sleep, and better body composition. Everyone should speak with their doctor before starting or changing an exercise routine.  Biomechanical Limitations Clinical staff conducted group or individual video education with verbal and written material and guidebook.  Patient learns how biomechanical limitations can impact exercise and how we can mitigate and possibly overcome limitations to have an impactful and balanced exercise routine.  Body Composition Clinical staff conducted group or individual video education with verbal and written material and guidebook.  Patient learns that body composition (ratio of muscle mass to fat mass) is a key component to assessing overall fitness, rather than body weight alone. Increased fat mass, especially visceral belly fat, can put us  at increased risk for metabolic syndrome, type 2 diabetes, heart disease, and even death. It is recommended to combine diet and exercise (cardiovascular and resistance training) to improve your body composition. Seek guidance from your physician and exercise physiologist before implementing an exercise routine.  Exercise Action Plan Clinical staff conducted group or individual video education with verbal and written material and guidebook.  Patient learns the recommended strategies to achieve and enjoy long-term exercise  adherence, including variety, self-motivation, self-efficacy, and positive decision making. Benefits of exercise include fitness, good health, weight management, more energy, better sleep, less stress, and overall well-being.  Medical   Heart Disease Risk Reduction Clinical staff conducted group or individual video education with verbal and written material and guidebook.  Patient learns our heart is our most vital organ as it circulates oxygen, nutrients, white blood cells, and hormones throughout the entire body, and carries waste away. Data supports a plant-based eating plan like the Pritikin Program for its effectiveness in slowing progression of and reversing heart disease. The video provides a number of recommendations to address heart disease.   Metabolic Syndrome and Belly Fat  Clinical staff conducted group or individual video education with verbal and written material and guidebook.  Patient learns what metabolic syndrome is, how it leads to heart disease, and how one can reverse it and keep it from coming back. You have metabolic syndrome if you have 3 of the following 5 criteria: abdominal obesity, high blood pressure, high triglycerides, low HDL cholesterol, and high blood sugar.  Hypertension and Heart Disease Clinical staff conducted group or individual video education with verbal and written material and guidebook.  Patient learns that high blood pressure, or hypertension, is very common in the [place] . Hypertension is largely due to excessive salt intake, but other important risk factors include being overweight,  physical inactivity, drinking too much alcohol, smoking, and not eating enough potassium from fruits and vegetables. High blood pressure is a leading risk factor for heart attack, stroke, congestive heart failure, dementia, kidney failure, and premature death. Long-term effects of excessive salt intake include stiffening of the arteries and thickening of heart muscle and  organ damage. Recommendations include ways to reduce hypertension and the risk of heart disease.  Diseases of Our Time - Focusing on Diabetes Clinical staff conducted group or individual video education with verbal and written material and guidebook.  Patient learns why the best way to stop diseases of our time is prevention, through food and other lifestyle changes. Medicine (such as prescription pills and surgeries) is often only a Band-Aid on the problem, not a long-term solution. Most common diseases of our time include obesity, type 2 diabetes, hypertension, heart disease, and cancer. The Pritikin Program is recommended and has been proven to help reduce, reverse, and/or prevent the damaging effects of metabolic syndrome.  Nutrition   Overview of the Pritikin Eating Plan  Clinical staff conducted group or individual video education with verbal and written material and guidebook.  Patient learns about the Pritikin Eating Plan for disease risk reduction. The Pritikin Eating Plan emphasizes a wide variety of unrefined, minimally-processed carbohydrates, like fruits, vegetables, whole grains, and legumes. Go, Caution, and Stop food choices are explained. Plant-based and lean animal proteins are emphasized. Rationale provided for low sodium intake for blood pressure control, low added sugars for blood sugar stabilization, and low added fats and oils for coronary artery disease risk reduction and weight management.  Calorie Density  Clinical staff conducted group or individual video education with verbal and written material and guidebook.  Patient learns about calorie density and how it impacts the Pritikin Eating Plan. Knowing the characteristics of the food you choose will help you decide whether those foods will lead to weight gain or weight loss, and whether you want to consume more or less of them. Weight loss is usually a side effect of the Pritikin Eating Plan because of its focus on low  calorie-dense foods.  Label Reading  Clinical staff conducted group or individual video education with verbal and written material and guidebook.  Patient learns about the Pritikin recommended label reading guidelines and corresponding recommendations regarding calorie density, added sugars, sodium content, and whole grains.  Dining Out - Part 1  Clinical staff conducted group or individual video education with verbal and written material and guidebook.  Patient learns that restaurant meals can be sabotaging because they can be so high in calories, fat, sodium, and/or sugar. Patient learns recommended strategies on how to positively address this and avoid unhealthy pitfalls.  Facts on Fats  Clinical staff conducted group or individual video education with verbal and written material and guidebook.  Patient learns that lifestyle modifications can be just as effective, if not more so, as many medications for lowering your risk of heart disease. A Pritikin lifestyle can help to reduce your risk of inflammation and atherosclerosis (cholesterol build-up, or plaque, in the artery walls). Lifestyle interventions such as dietary choices and physical activity address the cause of atherosclerosis. A review of the types of fats and their impact on blood cholesterol levels, along with dietary recommendations to reduce fat intake is also included.  Nutrition Action Plan  Clinical staff conducted group or individual video education with verbal and written material and guidebook.  Patient learns how to incorporate Pritikin recommendations into their lifestyle. Recommendations include planning  and keeping personal health goals in mind as an important part of their success.  Healthy Mind-Set    Healthy Minds, Bodies, Hearts  Clinical staff conducted group or individual video education with verbal and written material and guidebook.  Patient learns how to identify when they are stressed. Video will discuss the  impact of that stress, as well as the many benefits of stress management. Patient will also be introduced to stress management techniques. The way we think, act, and feel has an impact on our hearts.  How Our Thoughts Can Heal Our Hearts  Clinical staff conducted group or individual video education with verbal and written material and guidebook.  Patient learns that negative thoughts can cause depression and anxiety. This can result in negative lifestyle behavior and serious health problems. Cognitive behavioral therapy is an effective method to help control our thoughts in order to change and improve our emotional outlook.  Additional Videos:  Exercise    Improving Performance  Clinical staff conducted group or individual video education with verbal and written material and guidebook.  Patient learns to use a non-linear approach by alternating intensity levels and lengths of time spent exercising to help burn more calories and lose more body fat. Cardiovascular exercise helps improve heart health, metabolism, hormonal balance, blood sugar control, and recovery from fatigue. Resistance training improves strength, endurance, balance, coordination, reaction time, metabolism, and muscle mass. Flexibility exercise improves circulation, posture, and balance. Seek guidance from your physician and exercise physiologist before implementing an exercise routine and learn your capabilities and proper form for all exercise.  Introduction to Yoga  Clinical staff conducted group or individual video education with verbal and written material and guidebook.  Patient learns about yoga, a discipline of the coming together of mind, breath, and body. The benefits of yoga include improved flexibility, improved range of motion, better posture and core strength, increased lung function, weight loss, and positive self-image. Yoga's heart health benefits include lowered blood pressure, healthier heart rate, decreased  cholesterol and triglyceride levels, improved immune function, and reduced stress. Seek guidance from your physician and exercise physiologist before implementing an exercise routine and learn your capabilities and proper form for all exercise.  Medical   Aging: Enhancing Your Quality of Life  Clinical staff conducted group or individual video education with verbal and written material and guidebook.  Patient learns key strategies and recommendations to stay in good physical health and enhance quality of life, such as prevention strategies, having an advocate, securing a Health Care Proxy and Power of Attorney, and keeping a list of medications and system for tracking them. It also discusses how to avoid risk for bone loss.  Biology of Weight Control  Clinical staff conducted group or individual video education with verbal and written material and guidebook.  Patient learns that weight gain occurs because we consume more calories than we burn (eating more, moving less). Even if your body weight is normal, you may have higher ratios of fat compared to muscle mass. Too much body fat puts you at increased risk for cardiovascular disease, heart attack, stroke, type 2 diabetes, and obesity-related cancers. In addition to exercise, following the Pritikin Eating Plan can help reduce your risk.  Decoding Lab Results  Clinical staff conducted group or individual video education with verbal and written material and guidebook.  Patient learns that lab test reflects one measurement whose values change over time and are influenced by many factors, including medication, stress, sleep, exercise, food, hydration, pre-existing medical conditions,  and more. It is recommended to use the knowledge from this video to become more involved with your lab results and evaluate your numbers to speak with your doctor.   Diseases of Our Time - Overview  Clinical staff conducted group or individual video education with verbal  and written material and guidebook.  Patient learns that according to the CDC, 50% to 70% of chronic diseases (such as obesity, type 2 diabetes, elevated lipids, hypertension, and heart disease) are avoidable through lifestyle improvements including healthier food choices, listening to satiety cues, and increased physical activity.  Sleep Disorders Clinical staff conducted group or individual video education with verbal and written material and guidebook.  Patient learns how good quality and duration of sleep are important to overall health and well-being. Patient also learns about sleep disorders and how they impact health along with recommendations to address them, including discussing with a physician.  Nutrition  Dining Out - Part 2 Clinical staff conducted group or individual video education with verbal and written material and guidebook.  Patient learns how to plan ahead and communicate in order to maximize their dining experience in a healthy and nutritious manner. Included are recommended food choices based on the type of restaurant the patient is visiting.   Fueling a Banker conducted group or individual video education with verbal and written material and guidebook.  There is a strong connection between our food choices and our health. Diseases like obesity and type 2 diabetes are very prevalent and are in large-part due to lifestyle choices. The Pritikin Eating Plan provides plenty of food and hunger-curbing satisfaction. It is easy to follow, affordable, and helps reduce health risks.  Menu Workshop  Clinical staff conducted group or individual video education with verbal and written material and guidebook.  Patient learns that restaurant meals can sabotage health goals because they are often packed with calories, fat, sodium, and sugar. Recommendations include strategies to plan ahead and to communicate with the manager, chef, or server to help order a healthier  meal.  Planning Your Eating Strategy  Clinical staff conducted group or individual video education with verbal and written material and guidebook.  Patient learns about the Pritikin Eating Plan and its benefit of reducing the risk of disease. The Pritikin Eating Plan does not focus on calories. Instead, it emphasizes high-quality, nutrient-rich foods. By knowing the characteristics of the foods, we choose, we can determine their calorie density and make informed decisions.  Targeting Your Nutrition Priorities  Clinical staff conducted group or individual video education with verbal and written material and guidebook.  Patient learns that lifestyle habits have a tremendous impact on disease risk and progression. This video provides eating and physical activity recommendations based on your personal health goals, such as reducing LDL cholesterol, losing weight, preventing or controlling type 2 diabetes, and reducing high blood pressure.  Vitamins and Minerals  Clinical staff conducted group or individual video education with verbal and written material and guidebook.  Patient learns different ways to obtain key vitamins and minerals, including through a recommended healthy diet. It is important to discuss all supplements you take with your doctor.   Healthy Mind-Set    Smoking Cessation  Clinical staff conducted group or individual video education with verbal and written material and guidebook.  Patient learns that cigarette smoking and tobacco addiction pose a serious health risk which affects millions of people. Stopping smoking will significantly reduce the risk of heart disease, lung disease, and many forms of  cancer. Recommended strategies for quitting are covered, including working with your doctor to develop a successful plan.  Culinary   Becoming a Set designer conducted group or individual video education with verbal and written material and guidebook.  Patient learns  that cooking at home can be healthy, cost-effective, quick, and puts them in control. Keys to cooking healthy recipes will include looking at your recipe, assessing your equipment needs, planning ahead, making it simple, choosing cost-effective seasonal ingredients, and limiting the use of added fats, salts, and sugars.  Cooking - Breakfast and Snacks  Clinical staff conducted group or individual video education with verbal and written material and guidebook.  Patient learns how important breakfast is to satiety and nutrition through the entire day. Recommendations include key foods to eat during breakfast to help stabilize blood sugar levels and to prevent overeating at meals later in the day. Planning ahead is also a key component.  Cooking - Educational psychologist conducted group or individual video education with verbal and written material and guidebook.  Patient learns eating strategies to improve overall health, including an approach to cook more at home. Recommendations include thinking of animal protein as a side on your plate rather than center stage and focusing instead on lower calorie dense options like vegetables, fruits, whole grains, and plant-based proteins, such as beans. Making sauces in large quantities to freeze for later and leaving the skin on your vegetables are also recommended to maximize your experience.  Cooking - Healthy Salads and Dressing Clinical staff conducted group or individual video education with verbal and written material and guidebook.  Patient learns that vegetables, fruits, whole grains, and legumes are the foundations of the Pritikin Eating Plan. Recommendations include how to incorporate each of these in flavorful and healthy salads, and how to create homemade salad dressings. Proper handling of ingredients is also covered. Cooking - Soups and [employer] - Soups and Desserts Clinical staff conducted group or individual video education with  verbal and written material and guidebook.  Patient learns that Pritikin soups and desserts make for easy, nutritious, and delicious snacks and meal components that are low in sodium, fat, sugar, and calorie density, while high in vitamins, minerals, and filling fiber. Recommendations include simple and healthy ideas for soups and desserts.   Overview     The Pritikin Solution Program Overview Clinical staff conducted group or individual video education with verbal and written material and guidebook.  Patient learns that the results of the Pritikin Program have been documented in more than 100 articles published in peer-reviewed journals, and the benefits include reducing risk factors for (and, in some cases, even reversing) high cholesterol, high blood pressure, type 2 diabetes, obesity, and more! An overview of the three key pillars of the Pritikin Program will be covered: eating well, doing regular exercise, and having a healthy mind-set.  WORKSHOPS  Exercise: Exercise Basics: Building Your Action Plan Clinical staff led group instruction and group discussion with PowerPoint presentation and patient guidebook. To enhance the learning environment the use of posters, models and videos may be added. At the conclusion of this workshop, patients will comprehend the difference between physical activity and exercise, as well as the benefits of incorporating both, into their routine. Patients will understand the FITT (Frequency, Intensity, Time, and Type) principle and how to use it to build an exercise action plan. In addition, safety concerns and other considerations for exercise and cardiac rehab will be addressed by  the presenter. The purpose of this lesson is to promote a comprehensive and effective weekly exercise routine in order to improve patients' overall level of fitness.   Managing Heart Disease: Your Path to a Healthier Heart Clinical staff led group instruction and group discussion with  PowerPoint presentation and patient guidebook. To enhance the learning environment the use of posters, models and videos may be added.At the conclusion of this workshop, patients will understand the anatomy and physiology of the heart. Additionally, they will understand how Pritikin's three pillars impact the risk factors, the progression, and the management of heart disease.  The purpose of this lesson is to provide a high-level overview of the heart, heart disease, and how the Pritikin lifestyle positively impacts risk factors.  Exercise Biomechanics Clinical staff led group instruction and group discussion with PowerPoint presentation and patient guidebook. To enhance the learning environment the use of posters, models and videos may be added. Patients will learn how the structural parts of their bodies function and how these functions impact their daily activities, movement, and exercise. Patients will learn how to promote a neutral spine, learn how to manage pain, and identify ways to improve their physical movement in order to promote healthy living. The purpose of this lesson is to expose patients to common physical limitations that impact physical activity. Participants will learn practical ways to adapt and manage aches and pains, and to minimize their effect on regular exercise. Patients will learn how to maintain good posture while sitting, walking, and lifting.  Balance Training and Fall Prevention  Clinical staff led group instruction and group discussion with PowerPoint presentation and patient guidebook. To enhance the learning environment the use of posters, models and videos may be added. At the conclusion of this workshop, patients will understand the importance of their sensorimotor skills (vision, proprioception, and the vestibular system) in maintaining their ability to balance as they age. Patients will apply a variety of balancing exercises that are appropriate for their  current level of function. Patients will understand the common causes for poor balance, possible solutions to these problems, and ways to modify their physical environment in order to minimize their fall risk. The purpose of this lesson is to teach patients about the importance of maintaining balance as they age and ways to minimize their risk of falling.  WORKSHOPS   Nutrition:  Fueling a Ship broker led group instruction and group discussion with PowerPoint presentation and patient guidebook. To enhance the learning environment the use of posters, models and videos may be added. Patients will review the foundational principles of the Pritikin Eating Plan and understand what constitutes a serving size in each of the food groups. Patients will also learn Pritikin-friendly foods that are better choices when away from home and review make-ahead meal and snack options. Calorie density will be reviewed and applied to three nutrition priorities: weight maintenance, weight loss, and weight gain. The purpose of this lesson is to reinforce (in a group setting) the key concepts around what patients are recommended to eat and how to apply these guidelines when away from home by planning and selecting Pritikin-friendly options. Patients will understand how calorie density may be adjusted for different weight management goals.  Mindful Eating  Clinical staff led group instruction and group discussion with PowerPoint presentation and patient guidebook. To enhance the learning environment the use of posters, models and videos may be added. Patients will briefly review the concepts of the Pritikin Eating Plan and the importance of  low-calorie dense foods. The concept of mindful eating will be introduced as well as the importance of paying attention to internal hunger signals. Triggers for non-hunger eating and techniques for dealing with triggers will be explored. The purpose of this lesson is to provide  patients with the opportunity to review the basic principles of the Pritikin Eating Plan, discuss the value of eating mindfully and how to measure internal cues of hunger and fullness using the Hunger Scale. Patients will also discuss reasons for non-hunger eating and learn strategies to use for controlling emotional eating.  Targeting Your Nutrition Priorities Clinical staff led group instruction and group discussion with PowerPoint presentation and patient guidebook. To enhance the learning environment the use of posters, models and videos may be added. Patients will learn how to determine their genetic susceptibility to disease by reviewing their family history. Patients will gain insight into the importance of diet as part of an overall healthy lifestyle in mitigating the impact of genetics and other environmental insults. The purpose of this lesson is to provide patients with the opportunity to assess their personal nutrition priorities by looking at their family history, their own health history and current risk factors. Patients will also be able to discuss ways of prioritizing and modifying the Pritikin Eating Plan for their highest risk areas  Menu  Clinical staff led group instruction and group discussion with PowerPoint presentation and patient guidebook. To enhance the learning environment the use of posters, models and videos may be added. Using menus brought in from [employer], or printed from [employer], patients will apply the Pritikin dining out guidelines that were presented in the [employer] video. Patients will also be able to practice these guidelines in a variety of provided scenarios. The purpose of this lesson is to provide patients with the opportunity to practice hands-on learning of the Pritikin Dining Out guidelines with actual menus and practice scenarios.  Label Reading Clinical staff led group instruction and group discussion with PowerPoint  presentation and patient guidebook. To enhance the learning environment the use of posters, models and videos may be added. Patients will review and discuss the Pritikin label reading guidelines presented in Pritikin's Label Reading Educational series video. Using fool labels brought in from local grocery stores and markets, patients will apply the label reading guidelines and determine if the packaged food meet the Pritikin guidelines. The purpose of this lesson is to provide patients with the opportunity to review, discuss, and practice hands-on learning of the Pritikin Label Reading guidelines with actual packaged food labels. Cooking School  Pritikin's [employer] are designed to teach patients ways to prepare quick, simple, and affordable recipes at home. The importance of nutrition's role in chronic disease risk reduction is reflected in its emphasis in the overall Pritikin program. By learning how to prepare essential core Pritikin Eating Plan recipes, patients will increase control over what they eat; be able to customize the flavor of foods without the use of added salt, sugar, or fat; and improve the quality of the food they consume. By learning a set of core recipes which are easily assembled, quickly prepared, and affordable, patients are more likely to prepare more healthy foods at home. These workshops focus on convenient breakfasts, simple entres, side dishes, and desserts which can be prepared with minimal effort and are consistent with nutrition recommendations for cardiovascular risk reduction. Cooking [employer] are taught by a Armed forces logistics/support/administrative officer (RD) who has been trained by the [employer]  ICR Doctor, general practice. The chef or RD has a clear understanding of the importance of minimizing - if not completely eliminating - added fat, sugar, and sodium in recipes. Throughout the series of Cooking School Workshop sessions, patients will learn about healthy ingredients and  efficient methods of cooking to build confidence in their capability to prepare    Cooking School weekly topics:  Adding Flavor- Sodium-Free  Fast and Healthy Breakfasts  Powerhouse Plant-Based Proteins  Satisfying Salads and Dressings  Simple Sides and Sauces  International Cuisine-Spotlight on the [employer] Zones  Delicious Desserts  Savory Soups  [employer] - Meals in a Astronomer Appetizers and Snacks  Comforting Weekend Breakfasts  One-Pot Wonders   Fast Evening Meals  Landscape architect Your Pritikin Plate  WORKSHOPS   Healthy Mindset (Psychosocial):  Focused Goals, Sustainable Changes Clinical staff led group instruction and group discussion with PowerPoint presentation and patient guidebook. To enhance the learning environment the use of posters, models and videos may be added. Patients will be able to apply effective goal setting strategies to establish at least one personal goal, and then take consistent, meaningful action toward that goal. They will learn to identify common barriers to achieving personal goals and develop strategies to overcome them. Patients will also gain an understanding of how our mind-set can impact our ability to achieve goals and the importance of cultivating a positive and growth-oriented mind-set. The purpose of this lesson is to provide patients with a deeper understanding of how to set and achieve personal goals, as well as the tools and strategies needed to overcome common obstacles which may arise along the way.  From Head to Heart: The Power of a Healthy Outlook  Clinical staff led group instruction and group discussion with PowerPoint presentation and patient guidebook. To enhance the learning environment the use of posters, models and videos may be added. Patients will be able to recognize and describe the impact of emotions and mood on physical health. They will discover the importance of self-care and explore self-care  practices which may work for them. Patients will also learn how to utilize the 4 C's to cultivate a healthier outlook and better manage stress and challenges. The purpose of this lesson is to demonstrate to patients how a healthy outlook is an essential part of maintaining good health, especially as they continue their cardiac rehab journey.  Healthy Sleep for a Healthy Heart Clinical staff led group instruction and group discussion with PowerPoint presentation and patient guidebook. To enhance the learning environment the use of posters, models and videos may be added. At the conclusion of this workshop, patients will be able to demonstrate knowledge of the importance of sleep to overall health, well-being, and quality of life. They will understand the symptoms of, and treatments for, common sleep disorders. Patients will also be able to identify daytime and nighttime behaviors which impact sleep, and they will be able to apply these tools to help manage sleep-related challenges. The purpose of this lesson is to provide patients with a general overview of sleep and outline the importance of quality sleep. Patients will learn about a few of the most common sleep disorders. Patients will also be introduced to the concept of "sleep hygiene," and discover ways to self-manage certain sleeping problems through simple daily behavior changes. Finally, the workshop will motivate patients by clarifying the links between quality sleep and their goals of heart-healthy living.   Recognizing and Reducing Stress Clinical staff led group instruction and  group discussion with PowerPoint presentation and patient guidebook. To enhance the learning environment the use of posters, models and videos may be added. At the conclusion of this workshop, patients will be able to understand the types of stress reactions, differentiate between acute and chronic stress, and recognize the impact that chronic stress has on their health. They  will also be able to apply different coping mechanisms, such as reframing negative self-talk. Patients will have the opportunity to practice a variety of stress management techniques, such as deep abdominal breathing, progressive muscle relaxation, and/or guided imagery.  The purpose of this lesson is to educate patients on the role of stress in their lives and to provide healthy techniques for coping with it.  Learning Barriers/Preferences:  Learning Barriers/Preferences - 05/28/23 1410       Learning Barriers/Preferences   Learning Barriers Sight   glasses   Learning Preferences Group Instruction;Individual Instruction;Skilled Demonstration;Verbal Instruction;Video;Written Material          Education Topics:  Knowledge Questionnaire Score:  Knowledge Questionnaire Score - 05/28/23 1410       Knowledge Questionnaire Score   Pre Score 23/24          Core Components/Risk Factors/Patient Goals at Admission:  Personal Goals and Risk Factors at Admission - 05/28/23 1410       Core Components/Risk Factors/Patient Goals on Admission    Weight Management Yes;Obesity    Intervention Weight Management: Develop a combined nutrition and exercise program designed to reach desired caloric intake, while maintaining appropriate intake of nutrient and fiber, sodium and fats, and appropriate energy expenditure required for the weight goal.;Weight Management: Provide education and appropriate resources to help participant work on and attain dietary goals.;Weight Management/Obesity: Establish reasonable short term and long term weight goals.;Obesity: Provide education and appropriate resources to help participant work on and attain dietary goals.    Expected Outcomes Short Term: Continue to assess and modify interventions until short term weight is achieved;Long Term: Adherence to nutrition and physical activity/exercise program aimed toward attainment of established weight goal;Understanding  recommendations for meals to include 15-35% energy as protein, 25-35% energy from fat, 35-60% energy from carbohydrates, less than 200mg  of dietary cholesterol, 20-35 gm of total fiber daily;Understanding of distribution of calorie intake throughout the day with the consumption of 4-5 meals/snacks    Heart Failure Yes    Intervention Provide a combined exercise and nutrition program that is supplemented with education, support and counseling about heart failure. Directed toward relieving symptoms such as shortness of breath, decreased exercise tolerance, and extremity edema.    Expected Outcomes Improve functional capacity of life;Short term: Attendance in program 2-3 days a week with increased exercise capacity. Reported lower sodium intake. Reported increased fruit and vegetable intake. Reports medication compliance.;Short term: Daily weights obtained and reported for increase. Utilizing diuretic protocols set by physician.;Long term: Adoption of self-care skills and reduction of barriers for early signs and symptoms recognition and intervention leading to self-care maintenance.    Hypertension Yes    Intervention Provide education on lifestyle modifcations including regular physical activity/exercise, weight management, moderate sodium restriction and increased consumption of fresh fruit, vegetables, and low fat dairy, alcohol moderation, and smoking cessation.;Monitor prescription use compliance.    Expected Outcomes Short Term: Continued assessment and intervention until BP is < 140/21mm HG in hypertensive participants. < 130/39mm HG in hypertensive participants with diabetes, heart failure or chronic kidney disease.;Long Term: Maintenance of blood pressure at goal levels.    Lipids Yes  Intervention Provide education and support for participant on nutrition & aerobic/resistive exercise along with prescribed medications to achieve LDL 70mg , HDL >40mg .    Expected Outcomes Short Term: Participant  states understanding of desired cholesterol values and is compliant with medications prescribed. Participant is following exercise prescription and nutrition guidelines.;Long Term: Cholesterol controlled with medications as prescribed, with individualized exercise RX and with personalized nutrition plan. Value goals: LDL < 70mg , HDL > 40 mg.          Core Components/Risk Factors/Patient Goals Review:   Goals and Risk Factor Review     Row Name 06/11/23 1442 06/24/23 1745 07/22/23 1140 08/15/23 1307       Core Components/Risk Factors/Patient Goals Review   Personal Goals Review Weight Management/Obesity;Hypertension;Lipids;Heart Failure Weight Management/Obesity;Hypertension;Lipids;Heart Failure Weight Management/Obesity;Hypertension;Lipids;Heart Failure Weight Management/Obesity;Hypertension;Lipids;Heart Failure    Review Nadyne started cardiac rehab on 06/11/23. Grayce did well with exercise. Vital signs were stable.Luan is back on her  corlanor . Heart rate improved today. Charo is doing well with exercise. Systolic resting BP's have been in the 90's. Asymptomatic. Will continue to monitor. Harlie has increased her met level Makynna has been absent since 06/30/23. Shamariah has been absent since 08/11/23.  Has had spotty attendance and not significantly increased her Met goals at this point in time.    Expected Outcomes Grayce will continue to participate in cardiac rehab for exercise, nutrition and lifestyle modifications Grayce will continue to participate in cardiac rehab for exercise, nutrition and lifestyle modifications Grayce will continue to participate in cardiac rehab for exercise, nutrition and lifestyle modifications upon return to exercise. Grayce will continue to participate in cardiac rehab for exercise, nutrition and lifestyle modifications upon return to exercise.       Core Components/Risk Factors/Patient Goals at Discharge (Final Review):   Goals and Risk Factor Review - 08/15/23  1307       Core Components/Risk Factors/Patient Goals Review   Personal Goals Review Weight Management/Obesity;Hypertension;Lipids;Heart Failure    Review Saryiah has been absent since 08/11/23.  Has had spotty attendance and not significantly increased her Met goals at this point in time.    Expected Outcomes Grayce will continue to participate in cardiac rehab for exercise, nutrition and lifestyle modifications upon return to exercise.          ITP Comments:  ITP Comments     Row Name 05/28/23 1402 06/11/23 1440 06/24/23 1741 07/22/23 1131 08/15/23 1259   ITP Comments Dr. Wilbert Bihari medical director. Introduction to pritikin education/intensive cardiac rehab. Initial orientation packet reviewed with patient. 30 Day Robin started cardiac rehab on 06/11/23. Heart rate was improved after restarting corlanor . Grayce did well with exercise. 30 Day Grayce has good  attendance and participation with exercise at cardiac rehab 30 Day  ITP Review. Grayce has been absent since 07/07/23. Robin plans to return to exercise on 07/23/23 30 Day ITP Review.  Robin absent from 6/23-7/23.  Has attended 4 out of 8 possible rehab sessions since 7/23.      Comments: see ITP comments

## 2023-08-18 ENCOUNTER — Ambulatory Visit: Admitting: Gastroenterology

## 2023-08-18 ENCOUNTER — Encounter (HOSPITAL_COMMUNITY): Admission: RE | Admit: 2023-08-18 | Source: Ambulatory Visit

## 2023-08-20 ENCOUNTER — Encounter (HOSPITAL_COMMUNITY)
Admission: RE | Admit: 2023-08-20 | Discharge: 2023-08-20 | Disposition: A | Discharge status: Home / Self Care | Source: Ambulatory Visit | Attending: Internal Medicine

## 2023-08-20 DIAGNOSIS — I5022 Chronic systolic (congestive) heart failure: Secondary | ICD-10-CM

## 2023-08-21 ENCOUNTER — Telehealth (HOSPITAL_COMMUNITY): Payer: Self-pay | Admitting: *Deleted

## 2023-08-21 NOTE — Telephone Encounter (Signed)
 Spoke with Natalie Baker. Vannie has not been able to participation due to ongoing pain. Grayce has been discharged from cardiac rehab due to nonattendance.Hadassah Elpidio Quan RN BSN

## 2023-08-26 NOTE — Progress Notes (Signed)
 Discharge Progress Report  Patient Details  Name: Natalie Baker MRN: [id number] Date of Birth: 1955-05-15 Referring Provider:   Flowsheet Row INTENSIVE CARDIAC REHAB ORIENT from 05/28/2023 in [facility]  Referring Provider Toribio Fuel, MD     Number of Visits: 16  Reason for Discharge:  Early Exit:  non attendance   Smoking History:  Social History   Tobacco Use  Smoking Status Former   Current packs/day: 0.00   Types: Cigarettes   Start date: 01/07/1981   Quit date: 01/08/1996   Years since quitting: 27.6  Smokeless Tobacco Never  Tobacco Comments   quit 1998 but is exposed to 2nd hand smoke    Diagnosis:  Heart failure, chronic systolic (HCC)  ADL [facility]:   Initial Exercise Prescription:  Initial Exercise Prescription - 05/28/23 1500       Date of Initial Exercise RX and Referring Provider   Date 05/28/23    Referring Provider Toribio Fuel, MD    Expected Discharge Date 08/20/23      Recumbant Bike   Level 1    RPM 55    Watts 20    Minutes 15    METs 2      Track   Laps 15    Minutes 15    METs 2      Prescription Details   Frequency (times per week) 3    Duration Progress to 30 minutes of continuous aerobic without signs/symptoms of physical distress      Intensity   THRR 40-80% of Max Heartrate 61-122    Ratings of Perceived Exertion 11-13    Perceived Dyspnea 0-4      Progression   Progression Continue progressive overload as per policy without signs/symptoms or physical distress.      Resistance Training   Training Prescription Yes    Weight 2    Reps 10-15          Discharge Exercise Prescription (Final Exercise Prescription Changes):  Exercise Prescription Changes - 08/04/23 1500       Response to Exercise   Blood Pressure (Admit) 106/60    Blood Pressure (Exercise) 116/72    Blood Pressure (Exit) 98/70    Heart Rate (Admit) 81 bpm    Heart Rate (Exercise)  109 bpm    Heart Rate (Exit) 87 bpm    Rating of Perceived Exertion (Exercise) 9    Symptoms None    Comments Reviewed home exercise Rx    Duration Continue with 30 min of aerobic exercise without signs/symptoms of physical distress.    Intensity THRR unchanged      Progression   Progression Continue to progress workloads to maintain intensity without signs/symptoms of physical distress.    Average METs 2.6      Resistance Training   Training Prescription No    Weight 2 lbs    Reps 10-15    Time 5 Minutes      Interval Training   Interval Training No      Recumbant Bike   Level 2    RPM 75    Watts 13    Minutes 15    METs 2      Track   Laps 16    Minutes 15    METs 3.04      Home Exercise Plan   Plans to continue exercise at Home (comment)    Frequency Add 2 additional days to program exercise  sessions.    Initial Home Exercises Provided 08/04/23          Functional Capacity:  6 Minute Walk     Row Name 05/28/23 1530         6 Minute Walk   Phase Initial     Distance 1385 feet     Walk Time 6 minutes     # of Rest Breaks 0     MPH 2.62     METS 2.93     RPE 8     Perceived Dyspnea  0     VO2 Peak 10.26     Symptoms No     Resting HR 64 bpm     Resting BP 112/68     Resting Oxygen Saturation  98 %     Exercise Oxygen Saturation  during 6 min walk 98 %     Max Ex. HR 121 bpm     Max Ex. BP 118/62     2 Minute Post BP 116/70        Psychological, QOL, Others - Outcomes: PHQ 2/9:    07/07/2023    9:52 AM 05/28/2023    2:04 PM 04/07/2023    9:55 AM 01/09/2023    2:20 AM 12/12/2020    9:21 AM  Depression screen PHQ 2/9  Decreased Interest 0 0 0 0 0  Down, Depressed, Hopeless 0 0 0 0 1  PHQ - 2 Score 0 0 0 0 1  Altered sleeping  1 1    Tired, decreased energy  1 3    Change in appetite  0 2    Feeling bad or failure about yourself   0 0    Trouble concentrating  0 0    Moving slowly or fidgety/restless  0 1    Suicidal thoughts  0 0     PHQ-9 Score  2 7    Difficult doing work/chores  Not difficult at all Somewhat difficult      Quality of Life:  Quality of Life - 05/28/23 1533       Quality of Life   Select Quality of Life      Quality of Life Scores   Health/Function Pre 23.14 %    Socioeconomic Pre 30 %    Psych/Spiritual Pre 28.14 %    Family Pre 30 %    GLOBAL Pre 26.48 %          Personal Goals: Goals established at orientation with interventions provided to work toward goal.  Personal Goals and Risk Factors at Admission - 05/28/23 1410       Core Components/Risk Factors/Patient Goals on Admission    Weight Management Yes;Obesity    Intervention Weight Management: Develop a combined nutrition and exercise program designed to reach desired caloric intake, while maintaining appropriate intake of nutrient and fiber, sodium and fats, and appropriate energy expenditure required for the weight goal.;Weight Management: Provide education and appropriate resources to help participant work on and attain dietary goals.;Weight Management/Obesity: Establish reasonable short term and long term weight goals.;Obesity: Provide education and appropriate resources to help participant work on and attain dietary goals.    Expected Outcomes Short Term: Continue to assess and modify interventions until short term weight is achieved;Long Term: Adherence to nutrition and physical activity/exercise program aimed toward attainment of established weight goal;Understanding recommendations for meals to include 15-35% energy as protein, 25-35% energy from fat, 35-60% energy from carbohydrates, less than 200mg  of dietary cholesterol, 20-35 gm  of total fiber daily;Understanding of distribution of calorie intake throughout the day with the consumption of 4-5 meals/snacks    Heart Failure Yes    Intervention Provide a combined exercise and nutrition program that is supplemented with education, support and counseling about heart failure.  Directed toward relieving symptoms such as shortness of breath, decreased exercise tolerance, and extremity edema.    Expected Outcomes Improve functional capacity of life;Short term: Attendance in program 2-3 days a week with increased exercise capacity. Reported lower sodium intake. Reported increased fruit and vegetable intake. Reports medication compliance.;Short term: Daily weights obtained and reported for increase. Utilizing diuretic protocols set by physician.;Long term: Adoption of self-care skills and reduction of barriers for early signs and symptoms recognition and intervention leading to self-care maintenance.    Hypertension Yes    Intervention Provide education on lifestyle modifcations including regular physical activity/exercise, weight management, moderate sodium restriction and increased consumption of fresh fruit, vegetables, and low fat dairy, alcohol moderation, and smoking cessation.;Monitor prescription use compliance.    Expected Outcomes Short Term: Continued assessment and intervention until BP is < 140/81mm HG in hypertensive participants. < 130/76mm HG in hypertensive participants with diabetes, heart failure or chronic kidney disease.;Long Term: Maintenance of blood pressure at goal levels.    Lipids Yes    Intervention Provide education and support for participant on nutrition & aerobic/resistive exercise along with prescribed medications to achieve LDL 70mg , HDL >40mg .    Expected Outcomes Short Term: Participant states understanding of desired cholesterol values and is compliant with medications prescribed. Participant is following exercise prescription and nutrition guidelines.;Long Term: Cholesterol controlled with medications as prescribed, with individualized exercise RX and with personalized nutrition plan. Value goals: LDL < 70mg , HDL > 40 mg.           Personal Goals Discharge:  Goals and Risk Factor Review     Row Name 06/11/23 1442 06/24/23 1745 07/22/23  1140 08/15/23 1307       Core Components/Risk Factors/Patient Goals Review   Personal Goals Review Weight Management/Obesity;Hypertension;Lipids;Heart Failure Weight Management/Obesity;Hypertension;Lipids;Heart Failure Weight Management/Obesity;Hypertension;Lipids;Heart Failure Weight Management/Obesity;Hypertension;Lipids;Heart Failure    Review Veverly started cardiac rehab on 06/11/23. Grayce did well with exercise. Vital signs were stable.Luan is back on her  corlanor . Heart rate improved today. Shermika is doing well with exercise. Systolic resting BP's have been in the 90's. Asymptomatic. Will continue to monitor. Renate has increased her met level Taitum has been absent since 06/30/23. Kenneshia has been absent since 08/11/23.  Has had spotty attendance and not significantly increased her Met goals at this point in time.    Expected Outcomes Grayce will continue to participate in cardiac rehab for exercise, nutrition and lifestyle modifications Grayce will continue to participate in cardiac rehab for exercise, nutrition and lifestyle modifications Grayce will continue to participate in cardiac rehab for exercise, nutrition and lifestyle modifications upon return to exercise. Grayce will continue to participate in cardiac rehab for exercise, nutrition and lifestyle modifications upon return to exercise.       Exercise Goals and Review:  Exercise Goals     Row Name 05/28/23 1410             Exercise Goals   Increase Physical Activity Yes       Intervention Provide advice, education, support and counseling about physical activity/exercise needs.;Develop an individualized exercise prescription for aerobic and resistive training based on initial evaluation findings, risk stratification, comorbidities and participant's personal goals.       Expected Outcomes  Short Term: Attend rehab on a regular basis to increase amount of physical activity.;Long Term: Exercising regularly at least 3-5 days a week.;Long  Term: Add in home exercise to make exercise part of routine and to increase amount of physical activity.       Increase Strength and Stamina Yes       Intervention Provide advice, education, support and counseling about physical activity/exercise needs.;Develop an individualized exercise prescription for aerobic and resistive training based on initial evaluation findings, risk stratification, comorbidities and participant's personal goals.       Expected Outcomes Short Term: Increase workloads from initial exercise prescription for resistance, speed, and METs.;Short Term: Perform resistance training exercises routinely during rehab and add in resistance training at home;Long Term: Improve cardiorespiratory fitness, muscular endurance and strength as measured by increased METs and functional capacity ( )       Able to understand and use rate of perceived exertion (RPE) scale Yes       Intervention Provide education and explanation on how to use RPE scale       Expected Outcomes Short Term: Able to use RPE daily in rehab to express subjective intensity level;Long Term:  Able to use RPE to guide intensity level when exercising independently       Knowledge and understanding of Target Heart Rate Range (THRR) Yes       Intervention Provide education and explanation of THRR including how the numbers were predicted and where they are located for reference       Expected Outcomes Short Term: Able to state/look up THRR;Short Term: Able to use daily as guideline for intensity in rehab;Long Term: Able to use THRR to govern intensity when exercising independently       Understanding of Exercise Prescription Yes       Intervention Provide education, explanation, and written materials on patient's individual exercise prescription       Expected Outcomes Short Term: Able to explain program exercise prescription;Long Term: Able to explain home exercise prescription to exercise independently          Exercise  Goals Re-Evaluation:  Exercise Goals Re-Evaluation     Row Name 06/11/23 1419 07/30/23 1500           Exercise Goal Re-Evaluation   Exercise Goals Review Increase Physical Activity;Understanding of Exercise Prescription;Increase Strength and Stamina;Knowledge and understanding of Target Heart Rate Range (THRR);Able to understand and use rate of perceived exertion (RPE) scale Increase Physical Activity;Understanding of Exercise Prescription;Increase Strength and Stamina;Knowledge and understanding of Target Heart Rate Range (THRR);Able to understand and use rate of perceived exertion (RPE) scale      Comments Pt's first day in the [facility] program. Pt understands the exercise Rx, THRR and RPE scale. Reviewed METs and goals. Pt voices a little bit of change on her goal of increased strength and stamina. Pt attendance has been sporadic due to feeling unwell. Pt does voice progress on her goal of increasing her knowledge of nutrition.      Expected Outcomes Will continue to monitor patient and progress exercise workloads as tolerated. Will continue to monitor patient and progress exercise workloads as tolerated.         Nutrition & Weight - Outcomes:  Pre Biometrics - 05/28/23 1404       Pre Biometrics   Waist Circumference 44 inches    Hip Circumference 47 inches    Waist to Hip Ratio 0.94 %    Triceps Skinfold 37 mm    % Body  Fat 46.9 %    Grip Strength 10 kg    Flexibility 11 in    Single Leg Stand 5.73 seconds           Nutrition:  Nutrition Therapy & Goals - 08/08/23 1533       Nutrition Therapy   Diet Heart Healthy Diet      Personal Nutrition Goals   Nutrition Goal Patient to identify strategies for reducing cardiovascular risk by attending the [facility] education and nutrition series weekly.   goal not met.   Personal Goal #2 Patient to improve diet quality by using the plate method as a guide for meal planning to include lean protein/plant protein, fruits, vegetables, whole  grains, nonfat dairy as part of a well-balanced diet.   goal in progress.   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 Attendance has been variable; she has attended 5 [facility] education sessions. She was referred to GI for unwanted weight loss and persistent abdominal pain at 05/01/23 oncology follow-up. She has maintained her weight since starting with our program. A1c remains in a prediabetic range. LDL is not at goal. Patient will benefit from participation in intensive cardiac rehab for nutrition, exercise, and lifestyle modification.      Intervention Plan   Intervention Prescribe, educate and counsel regarding individualized specific dietary modifications aiming towards targeted core components such as weight, hypertension, lipid management, diabetes, heart failure and other comorbidities.;Nutrition handout(s) given to patient.    Expected Outcomes Short Term Goal: Understand basic principles of dietary content, such as calories, fat, sodium, cholesterol and nutrients.;Long Term Goal: Adherence to prescribed nutrition plan.          Nutrition Discharge:   Education Questionnaire Score:  Knowledge Questionnaire Score - 05/28/23 1410       Knowledge Questionnaire Score   Pre Score 23/24          Robin attended 16 exercise and education classes. Grayce did not attend many exercise session but enjoyed participating in the program. Robin's last day of attendance was on 08/11/23.Hadassah Elpidio Quan RN BSN

## 2023-09-01 ENCOUNTER — Other Ambulatory Visit: Payer: Self-pay | Admitting: Family Medicine

## 2023-09-03 ENCOUNTER — Other Ambulatory Visit: Payer: Self-pay | Admitting: Family Medicine

## 2023-09-04 ENCOUNTER — Other Ambulatory Visit: Payer: Self-pay | Admitting: Family Medicine

## 2023-09-04 DIAGNOSIS — E039 Hypothyroidism, unspecified: Secondary | ICD-10-CM

## 2023-09-04 MED ORDER — LEVOTHYROXINE SODIUM 100 MCG PO TABS: ORAL_TABLET | ORAL | 0 refills | Status: DC

## 2023-09-04 NOTE — Telephone Encounter (Signed)
 Copied from CRM [id number]. Topic: Clinical - Medication Refill >> Sep 04, 2023 11:37 AM Melissa C wrote: Medication: levothyroxine  (SYNTHROID ) 100 MCG tablet  Has the patient contacted their pharmacy? Yes (Agent: If no, request that the patient contact the pharmacy for the refill. If patient does not wish to contact the pharmacy document the reason why and proceed with request.) (Agent: If yes, when and what did the pharmacy advise?)  Patient is confused because her bottle states she has 0 refills but she saw the doctor a few months ago (according to records on 07/07/23). Pharmacy advised since she recently saw doctor to call in and see what she could do regarding refill status    [id number] - [place], [place] - [address] [place] [address] Phone: [phone] Fax: [phone]  Is this the correct pharmacy for this prescription? Yes If no, delete pharmacy and type the correct one.   Has the prescription been filled recently? No  Is the patient out of the medication? No- patient only has one pill left  Has the patient been seen for an appointment in the last year OR does the patient have an upcoming appointment? Yes  Can we respond through MyChart? Yes  Agent: Please be advised that Rx refills may take up to 3 business days. We ask that you follow-up with your pharmacy.

## 2023-09-05 ENCOUNTER — Inpatient Hospital Stay: Attending: Internal Medicine

## 2023-09-05 DIAGNOSIS — Z95828 Presence of other vascular implants and grafts: Secondary | ICD-10-CM

## 2023-09-05 MED ORDER — SODIUM CHLORIDE 0.9% FLUSH
10.0000 mL | INTRAVENOUS | Status: DC | PRN
  Administered 2023-09-05: 10 mL

## 2023-09-18 ENCOUNTER — Ambulatory Visit (HOSPITAL_COMMUNITY)
Admission: RE | Admit: 2023-09-18 | Discharge: 2023-09-18 | Disposition: A | Discharge status: Home / Self Care | Source: Ambulatory Visit | Attending: Internal Medicine | Admitting: Internal Medicine

## 2023-09-18 ENCOUNTER — Encounter (HOSPITAL_COMMUNITY): Payer: Self-pay | Admitting: Internal Medicine

## 2023-09-18 VITALS — BP 118/74 | HR 70 | Ht 62.0 in | Wt 170.6 lb

## 2023-09-18 DIAGNOSIS — R570 Cardiogenic shock: Secondary | ICD-10-CM | POA: Insufficient documentation

## 2023-09-18 DIAGNOSIS — Z79899 Other long term (current) drug therapy: Secondary | ICD-10-CM | POA: Insufficient documentation

## 2023-09-18 DIAGNOSIS — Z7984 Long term (current) use of oral hypoglycemic drugs: Secondary | ICD-10-CM | POA: Diagnosis not present

## 2023-09-18 DIAGNOSIS — I5022 Chronic systolic (congestive) heart failure: Secondary | ICD-10-CM | POA: Insufficient documentation

## 2023-09-18 DIAGNOSIS — J984 Other disorders of lung: Secondary | ICD-10-CM | POA: Insufficient documentation

## 2023-09-18 DIAGNOSIS — I472 Ventricular tachycardia, unspecified: Secondary | ICD-10-CM

## 2023-09-18 DIAGNOSIS — Z79890 Hormone replacement therapy (postmenopausal): Secondary | ICD-10-CM | POA: Insufficient documentation

## 2023-09-18 DIAGNOSIS — Z7901 Long term (current) use of anticoagulants: Secondary | ICD-10-CM | POA: Insufficient documentation

## 2023-09-18 DIAGNOSIS — C8590 Non-Hodgkin lymphoma, unspecified, unspecified site: Secondary | ICD-10-CM | POA: Diagnosis not present

## 2023-09-18 DIAGNOSIS — Z86718 Personal history of other venous thrombosis and embolism: Secondary | ICD-10-CM | POA: Diagnosis not present

## 2023-09-18 DIAGNOSIS — C8310 Mantle cell lymphoma, unspecified site: Secondary | ICD-10-CM | POA: Insufficient documentation

## 2023-09-18 DIAGNOSIS — I82622 Acute embolism and thrombosis of deep veins of left upper extremity: Secondary | ICD-10-CM | POA: Insufficient documentation

## 2023-09-18 DIAGNOSIS — Z7722 Contact with and (suspected) exposure to environmental tobacco smoke (acute) (chronic): Secondary | ICD-10-CM | POA: Insufficient documentation

## 2023-09-18 DIAGNOSIS — R9431 Abnormal electrocardiogram [ECG] [EKG]: Secondary | ICD-10-CM | POA: Diagnosis not present

## 2023-09-18 DIAGNOSIS — I340 Nonrheumatic mitral (valve) insufficiency: Secondary | ICD-10-CM

## 2023-09-18 DIAGNOSIS — Z87891 Personal history of nicotine dependence: Secondary | ICD-10-CM | POA: Insufficient documentation

## 2023-09-18 LAB — COMPREHENSIVE METABOLIC PANEL WITH GFR
ALT: 15 U/L (ref 0–44)
AST: 22 U/L (ref 15–41)
Albumin: 3.4 g/dL — ABNORMAL LOW (ref 3.5–5.0)
Alkaline Phosphatase: 117 U/L (ref 38–126)
Anion gap: 13 (ref 5–15)
BUN: 17 mg/dL (ref 8–23)
CO2: 29 mmol/L (ref 22–32)
Calcium: 9.7 mg/dL (ref 8.9–10.3)
Chloride: 94 mmol/L — ABNORMAL LOW (ref 98–111)
Creatinine, Ser: 1.25 mg/dL — ABNORMAL HIGH (ref 0.44–1.00)
GFR, Estimated: 47 mL/min — ABNORMAL LOW (ref >60)
Glucose, Bld: 94 mg/dL (ref 70–99)
Potassium: 3.7 mmol/L (ref 3.5–5.1)
Sodium: 136 mmol/L (ref 135–145)
Total Bilirubin: 0.5 mg/dL (ref 0.0–1.2)
Total Protein: 6.8 g/dL (ref 6.5–8.1)

## 2023-09-18 LAB — TSH: TSH: 0.712 u[IU]/mL (ref 0.350–4.500)

## 2023-09-18 LAB — BRAIN NATRIURETIC PEPTIDE: B Natriuretic Peptide: 161.8 pg/mL — ABNORMAL HIGH (ref 0.0–100.0)

## 2023-09-18 LAB — T4, FREE: Free T4: 1.49 ng/dL — ABNORMAL HIGH (ref 0.61–1.12)

## 2023-09-18 MED ORDER — LOSARTAN POTASSIUM 25 MG PO TABS: 25.0000 mg | ORAL_TABLET | Freq: Every day | ORAL | 3 refills | Status: AC

## 2023-09-18 NOTE — Progress Notes (Signed)
 Advanced Heart Failure Clinic Note   PCP: [name], MD PCP-Cardiologist: None  AHFC: Dr. Cherrie   HPI: Natalie Baker is a 68 y.o. female with a hx of palpitations thought to be PVCs, HTN, non hodgkins lymphoma treated w/ R-CHOP and systolic HF.   Admitted 6/21 with acute HF. ECHO  EF 20-25% w/ mod-severe MR.  RV moderately reduced. Developed VT and was started on amiodarone  and transferred to [facility] for [facility] which showed normal coronaries, elevated filling pressures w/ low CO c/w cardiogenic shock. CI was 1.7. She was started on milrinone , IV amiodarone  and IV Lasix  for diuresis.   Echo 9/21: EF 40-45%   On repeat echo 05/04/20 EF had normalized to 50-55% G2DD.    At oncology f/u 6/22,  repeat CT scan unfortunately showed evidence for disease progression with mild increase in the small bilateral axillary lymph nodes and moderate increase in the left supraclavicular as well as ileocolic mesenteric lymph nodes and left external iliac lymph nodes but mild increase in the periaortic retroperitoneal lymph nodes. She started treatment with acalabrutinib  100 mg p.o. twice daily on June 17 2020 and Dr. Sherrod referred her to [facility] for bone marrow transplant evaluation. As part of w/u at [facility], an echocardiogram was obtained and showed drop in LVEF back down to 40% w/ echodensity vs thickening of the left ventricular apex concerning for possible LV thrombus. Subsequently, she was referred back to [facility] for further evaluation.    Had follow up 10/22, and endorsed stable NYHA II symptoms. Losartan  was increased and cMRI arranged to assess for LV thrombus.    cMRI 11/22 LVEF 39%, no LV thrombus noted.   Follow up 11/22 Not a candidate for stem cell transplant, but will trial CAR-T therapy soon.    Recently d/c from [facility] after CAR-T therapy, had cytokine storm, taken off all HF meds due to low BP. Echo 1/23 with EF 40-45%.    Admitted from HF clinic on 03/07/21 with acute on chronic systolic  CHF with low-output. Bedside echo with EF < 20%. PICC line placed. Initial co-ox 34% and she was started on inotrope support with milrinone . Diuresed with IV lasix  then transitioned to po torsemide . She failed initial milrinone  wean then was eventually titrated off milrinone  with stable co-ox. Torsemide  decreased to 20 mg daily (starting 03/15) at discharge d/t AKI and low volume. Scr 1.24>>1.64 (baseline 1.0). She was started on GDMT. Had frequent PVCs and runs of NSVT which improved with addition of po amiodarone . Course also complicated by left brachial DVT for which she was started on Eliquis . Discharged on 3/13. D/c wt 172 lb.   Post hospital follow up doing well. Labs showed worsening SCr and elevated dig level. he was instructed to stop digoxin , hold torsemide  x 1 day then resume at every other day dosing. Repeat labs showed stable SCr and improved dig level. She remained off dig.   Per Dr. Jeannett note, she remains on observation w/ plans to continue monthly blood work and f/u w/ him in 2 months.   Echo 3/23 EF 20-25%   Echo 06/29/21 EF 35-40% (read as 30-35%) severe MR   TEE 07/04/21 EF 20-25% severe MR moderate  Follow up 4/24, doing well. Volume ok on torsemide  20 mg daily and BP stable off midodrine .   Echo 5/24 showed EF 35%, RV mildly reduced, mild to moderate MR  Echo 6/25 EF 35-40% moderate MR  Today she returns for HF follow up, daughter via phone. Feels pretty  good. Finished CR several weeks ago. Not very active at home. Can do all ADLs without problem.No edema, orthopnea or PND. Compliant with meds. SBP 100-120 (rarely in 80s)     Cardiac Studies: Echo 5/24: EF 35%, RV mildly reduced, mild to moderate MR  TEE 07/04/21: EF 20-25% severe MR moderate  Echo 06/29/21: EF 35-40% (read as 30-35%) severe MR   Echo 3/23: EF 20-25%, RV mildly reduced. Small pericardial effusion, no tamponade    cMRI 11/22: EF 39%, no LV thrombus  Echo [facility] 10/04/20: EF 40%, grade I DD, RV ok,  mild MR, mild LAE, small circumferential pericardial effusion, Echodensity vs thickening of the left ventricular apex, cannot rule out thrombus  Echo 4/22: EF 50-55% G2DD. RV normal   Echo 9/21: EF 40-45%, RV normal   Echo 6/21: EF 20-25%  moderately reduced RV function with severe MR/TR   Cath 6/18: with normal cors. Low output. Ao = 109/71 (81) LV =  99/23 RA =  15 RV =  43/18 PA =  35/14 (25) PCW = 22 Fick cardiac output/index = 3.3/1.7 PVR = < 1.0 WU Ao sat = 100% PA sat = 49%, 53%   - Venous duplex LUE, 3/23 Summary:  Right:  No evidence of thrombosis in the subclavian.     Left:  Findings consistent with partial age indeterminate deep vein thrombosis  involving the left brachial veins.  Review of systems complete and found to be negative unless listed in HPI.    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 CHOP and others   Obesity    BMI 34   Osteoporosis    most recent DEXA 02/2022   Pre-diabetes    Secondary nonischemic congestive cardiomyopathy (HCC)    Adriamycin  2020   Ventricular tachycardia (HCC)    Vitamin D  deficiency    Current Outpatient Medications  Medication Sig Dispense Refill   amiodarone  (PACERONE ) 200 MG tablet TAKE 1/2 TABLET BY MOUTH DAILY 45 tablet 3   Cholecalciferol  (VITAMIN D -3) 25 MCG (1000 UT) CAPS Take 1 capsule by mouth daily.     ELIQUIS  5 MG TABS tablet TAKE 1 TABLET BY MOUTH TWICE A DAY 60 tablet 6   estradiol  (VIVELLE -DOT) 0.05 MG/24HR patch Place 1 patch onto the skin 2 (two) times a week. Tuesdays & Fridays     FARXIGA  10 MG TABS tablet TAKE 1 TABLET BY MOUTH EVERY DAY 30 tablet 11    ivabradine  (CORLANOR ) 7.5 MG TABS tablet Take 1 tablet (7.5 mg total) by mouth 2 (two) times daily with a meal. 60 tablet 11   levothyroxine  (SYNTHROID ) 100 MCG tablet TAKE 1 TABLET BY MOUTH DAILY ON AN EMPTY STOMACH WITH ONLY WATER FOR 30 MINUTES & NO ANTACID/CALCIUM /MAGNESIUM  FOR 4 HOURS & AVOID BIOTIN 30 tablet 0   losartan  (COZAAR ) 25 MG tablet Take 0.5 tablets (12.5 mg total) by mouth daily. 45 tablet 3   ondansetron  (ZOFRAN -ODT) 4 MG disintegrating tablet Take 1 tablet (4 mg total) by mouth every 8 (eight) hours as needed for nausea or vomiting. 30 tablet 3   potassium chloride  (KLOR-CON ) 10 MEQ tablet  2 tabs po qd     pravastatin  (PRAVACHOL ) 20 MG tablet TAKE 1 TABLET BY MOUTH EVERY DAY 90 tablet 0   prochlorperazine  (COMPAZINE ) 10 MG tablet TAKE 1 TABLET BY MOUTH EVERY 6 HOURS AS NEEDED FOR NAUSEA OR VOMITING. 30 tablet 0   spironolactone  (ALDACTONE ) 25 MG tablet TAKE 1 TABLET BY MOUTH EVERY DAY 90 tablet 3   torsemide  (DEMADEX ) 20 MG tablet Take 1 tablet (20 mg total) by mouth daily.     No current facility-administered medications for this encounter.   Allergies  Allergen Reactions   Adriamycin  [Doxorubicin ] Other (See Comments)    Acute heart failure (chemo from Jan - May 2021)   Ppd [Tuberculin Purified Protein Derivative] Swelling    Positive reaction.  Negative Chest Xray 06/16/12   Social History   Socioeconomic History   Marital status: Married    Spouse name: Not on file   Number of children: 1   Years of education: Not on file   Highest education level: Not on file  Occupational History   Occupation: Marine scientist  Tobacco Use   Smoking status: Former    Current packs/day: 0.00    Types: Cigarettes    Start date: 01/07/1981    Quit date: 01/08/1996    Years since quitting: 27.7   Smokeless tobacco: Never   Tobacco comments:    quit 1998 but is exposed to 2nd hand smoke  Vaping Use   Vaping status: Never Used  Substance and Sexual Activity   Alcohol use:  No   Drug use: No   Sexual activity: Yes    Partners: Male    Birth control/protection: Post-menopausal  Other Topics Concern   Not on file  Social History Narrative   Married, one daughter,    Educ: HS   Occup:  employed as an Conservator, museum/gallery.   No tob or alc   Social Drivers of Corporate investment banker Strain: Low Risk  (03/22/2021)   Overall Financial Resource Strain (CARDIA)    Difficulty of Paying Living Expenses: Not very hard  Food Insecurity: No Food Insecurity (03/22/2021)   Hunger Vital Sign    Worried About Running Out of Food in the Last Year: Never true    Ran Out of Food in the Last Year: Never true  Transportation Needs: No Transportation Needs (03/22/2021)   PRAPARE - Administrator, Civil Service (Medical): No    Lack of Transportation (Non-Medical): No  Physical Activity: Inactive (08/13/2018)   Exercise Vital Sign    Days of Exercise per Week: 0 days    Minutes of Exercise per Session: 0 min  Stress: No Stress Concern Present (08/13/2018)   [employer] of Occupational Health - Occupational Stress Questionnaire    Feeling of Stress : Only a little  Social Connections: Moderately Integrated (07/02/2019)   Social Connection and Isolation Panel    Frequency of Communication with Friends and Family: More than three times a week    Frequency of Social Gatherings with Friends and Family: Once a week    Attends Religious Services: More than 4 times per year    Active Member of [employer] or Organizations: No    Attends Banker Meetings: Never    Marital Status: Married  Catering manager Violence: Not on file   Family History  Problem Relation Age of Onset   Heart disease Mother 2       chf   Diabetes Mother    Thyroid  disease  Mother    Cancer Mother 30       female/ skin   Hypertension Mother    Liver disease Mother    Hypertension Father    Alcohol abuse Father    Heart disease Father    Breast cancer Sister 38   Diabetes Sister     Hypothyroidism Sister    Hashimoto's thyroiditis Sister    Hyperlipidemia Brother    Hypertension Brother        x 2   Diabetes Brother    Hypothyroidism Brother    Prostate cancer Brother 36   Liver disease Maternal Grandmother    Diabetes Paternal Grandmother    Breast cancer Maternal Aunt    Hyperlipidemia Maternal Aunt    Heart disease Maternal Aunt    Bone cancer Maternal Aunt        metastatic   Breast cancer Niece 24   Colon cancer Neg Hx    Colon polyps Neg Hx    Esophageal cancer Neg Hx    Rectal cancer Neg Hx    Stomach cancer Neg Hx    BP 118/74   Pulse 70   Ht 5' 2 (1.575 m)   Wt 77.4 kg (170 lb 9.6 oz)   SpO2 97%   BMI 31.20 kg/m   Wt Readings from Last 3 Encounters:  09/18/23 77.4 kg (170 lb 9.6 oz)  07/07/23 81.6 kg (179 lb 12.8 oz)  06/13/23 81.4 kg (179 lb 7 oz)   PHYSICAL EXAM: General:  Well appearing. No resp difficulty HEENT: normal Neck: supple. no JVD. Carotids 2+ bilat; no bruits. No lymphadenopathy or thryomegaly appreciated. Cor: PMI nondisplaced. Regular rate & rhythm. No rubs, gallops or murmurs. Lungs: clear Abdomen: soft, nontender, nondistended. No hepatosplenomegaly. No bruits or masses. Good bowel sounds. Extremities: no cyanosis, clubbing, rash, edema Neuro: alert & orientedx3, cranial nerves grossly intact. moves all 4 extremities w/o difficulty. Affect pleasant  ECG: NSR 68 1AVB Personally reviewed  ASSESSMENT & PLAN: 1. Chronic Systolic HF  - Echo 1/21 EF 55-60%. RV normal - Echo 6/21 EF 20-25%  moderately reduced RV function with severe MR/TR - Cath 06/25/19 no CAD. - Drop in EF felt most likely from R-CHOP - Echo 05/04/20 w/ EF 50-55% G2DD. RV normal - Echo 9/22 at [facility] EF back down to 40% w/ ? LV thrombus - cMRI 11/22 LVEF 39%, no LV clot - Echo 1/23 [facility]): EF 40-45% - Echo 3/23: EF 20-25%, RV mildly reduced, RVSP 44 mmHg - Direct admit from clinic 03/07/21  Repeat echo, EF 20%.  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  Required milrinone  for low-output.  - Echo 06/29/21 EF improved 35-40% (read as 30-35%) with new severe MR - TEE 07/04/21 EF 20-25% severe MR moderate - Echo (5/24): EF 35%, RV mildly reduced, mild to moderate MR - Echo 6/25 35-40% mod MR Personally reviewed - Stable NYHA II. Volume ok  - Increase losartan  to 25mg  at bedtime. - Continue torsemide  20 mg daily + 40 KCL daily (change to 10 mEq tabs) - Continue Farxiga  10 mg daily. NO GU symptoms. - Continue spiro 25 mg daily.  - Continue ivabradine  7.5 mg bid  - Get labs today  - Encouraged her to be more active  2. H/o VT + PVCs - Quiescent - Continue amio 100 mg daily - Check amio labs today  3. Severe MR  - likely functional - TEE 07/04/21 EF 20-25% severe MR moderate - Mild to moderate on most recent echo - Echo 6/25  moderate MR   4. Stage IV non-Hodgkin's lymphoma, mantle cell lymphoma   - s/p R-CHOP w/ Neulasta . - Evidence of disease progression on f/u Chest CT 6/22. - s/p CAR-T therapy. - Followed at [facility] with watchful waiting   5. Partial Lt Brachial DVT (provoked) - Diagnosed 03/13/21 - Continue Eliquis    6. HL - LDL 155  - Felt bad on Crestor .  - Per PCP    Toribio Fuel, MD 09/18/23

## 2023-09-18 NOTE — Patient Instructions (Signed)
 INCREASE Losartan  to 25 mg daily.  Labs done today, your results will be available in MyChart, we will contact you for abnormal readings.  Your physician recommends that you schedule a follow-up appointment in: 6 months ( March 2026) ** PLEASE CALL THE OFFICE IN JANUARY 2026 TO ARRANGE YOUR FOLLOW UP APPOINTMENT.**  If you have any questions or concerns before your next appointment please send us  a message through [place] or call our office at [phone].    TO LEAVE A MESSAGE FOR THE NURSE SELECT OPTION 2, PLEASE LEAVE A MESSAGE INCLUDING: YOUR NAME DATE OF BIRTH CALL BACK NUMBER REASON FOR CALL**this is important as we prioritize the call backs  YOU WILL RECEIVE A CALL BACK THE SAME DAY AS LONG AS YOU CALL BEFORE 4:00 PM  At the [facility], you and your health needs are our priority. As part of our continuing mission to provide you with exceptional heart care, we have created designated Provider Care Teams. These Care Teams include your primary Cardiologist (physician) and Advanced Practice Providers (APPs- Physician Assistants and Nurse Practitioners) who all work together to provide you with the care you need, when you need it.   You may see any of the following providers on your designated Care Team at your next follow up: Dr Toribio Fuel Dr Ezra Shuck Dr. Ria Commander Dr. Morene Brownie Amy Lenetta, NP Caffie Shed, [place] [facility] [place], [place] Beckey Coe, NP [place] Lee, NP Ellouise Class, NP Tinnie Redman, PharmD Jaun Bash, PharmD   Please be sure to bring in all your medications bottles to every appointment.    Thank you for choosing [facility]

## 2023-09-19 LAB — T3, FREE: T3, Free: 2.1 pg/mL (ref 2.0–4.4)

## 2023-10-02 ENCOUNTER — Other Ambulatory Visit: Payer: Self-pay | Admitting: Family Medicine

## 2023-10-02 DIAGNOSIS — E039 Hypothyroidism, unspecified: Secondary | ICD-10-CM

## 2023-10-02 NOTE — Telephone Encounter (Signed)
 Pt has upcoming appt 9/30

## 2023-10-03 ENCOUNTER — Other Ambulatory Visit: Payer: Self-pay

## 2023-10-03 DIAGNOSIS — E039 Hypothyroidism, unspecified: Secondary | ICD-10-CM

## 2023-10-03 MED ORDER — LEVOTHYROXINE SODIUM 100 MCG PO TABS: ORAL_TABLET | ORAL | 0 refills | Status: DC

## 2023-10-07 ENCOUNTER — Ambulatory Visit: Admitting: Family Medicine

## 2023-10-07 DIAGNOSIS — E039 Hypothyroidism, unspecified: Secondary | ICD-10-CM

## 2023-10-09 ENCOUNTER — Ambulatory Visit: Admitting: Family Medicine

## 2023-10-12 ENCOUNTER — Other Ambulatory Visit (HOSPITAL_COMMUNITY): Payer: Self-pay | Admitting: Internal Medicine

## 2023-10-17 ENCOUNTER — Encounter: Payer: Self-pay | Admitting: Family Medicine

## 2023-10-20 ENCOUNTER — Other Ambulatory Visit: Payer: Self-pay | Admitting: Internal Medicine

## 2023-10-20 MED ORDER — TEMAZEPAM 30 MG PO CAPS: 30.0000 mg | ORAL_CAPSULE | Freq: Every evening | ORAL | 0 refills | Status: AC | PRN

## 2023-10-21 ENCOUNTER — Telehealth: Payer: Self-pay | Admitting: *Deleted

## 2023-10-21 ENCOUNTER — Encounter: Payer: Self-pay | Admitting: Internal Medicine

## 2023-10-21 NOTE — Telephone Encounter (Signed)
 Called and left a message that Dr. Sherrod wants to see pt. Sooner that 10/27/23. Will reschedule CT scan and Dr. Sherrod appt.

## 2023-10-23 ENCOUNTER — Ambulatory Visit (HOSPITAL_COMMUNITY)
Admission: RE | Admit: 2023-10-23 | Discharge: 2023-10-23 | Disposition: A | Discharge status: Home / Self Care | Source: Ambulatory Visit | Attending: Physician Assistant | Admitting: Physician Assistant

## 2023-10-23 ENCOUNTER — Inpatient Hospital Stay: Attending: Internal Medicine

## 2023-10-23 DIAGNOSIS — C8313 Mantle cell lymphoma, intra-abdominal lymph nodes: Secondary | ICD-10-CM

## 2023-10-23 DIAGNOSIS — Z8572 Personal history of non-Hodgkin lymphomas: Secondary | ICD-10-CM | POA: Diagnosis present

## 2023-10-23 LAB — LACTATE DEHYDROGENASE: LDH: 265 U/L — ABNORMAL HIGH (ref 98–192)

## 2023-10-23 LAB — CMP (CANCER CENTER ONLY)
ALT: 9 U/L (ref 0–44)
AST: 13 U/L — ABNORMAL LOW (ref 15–41)
Albumin: 3.5 g/dL (ref 3.5–5.0)
Alkaline Phosphatase: 129 U/L — ABNORMAL HIGH (ref 38–126)
Anion gap: 9 (ref 5–15)
BUN: 17 mg/dL (ref 8–23)
CO2: 33 mmol/L — ABNORMAL HIGH (ref 22–32)
Calcium: 9.3 mg/dL (ref 8.9–10.3)
Chloride: 93 mmol/L — ABNORMAL LOW (ref 98–111)
Creatinine: 1.42 mg/dL — ABNORMAL HIGH (ref 0.44–1.00)
GFR, Estimated: 41 mL/min — ABNORMAL LOW (ref >60)
Glucose, Bld: 83 mg/dL (ref 70–99)
Potassium: 3.4 mmol/L — ABNORMAL LOW (ref 3.5–5.1)
Sodium: 135 mmol/L (ref 135–145)
Total Bilirubin: 0.6 mg/dL (ref 0.0–1.2)
Total Protein: 6.1 g/dL — ABNORMAL LOW (ref 6.5–8.1)

## 2023-10-23 LAB — CBC WITH DIFFERENTIAL (CANCER CENTER ONLY)
Abs Immature Granulocytes: 0.28 K/uL — ABNORMAL HIGH (ref 0.00–0.07)
Basophils Absolute: 0.1 K/uL (ref 0.0–0.1)
Basophils Relative: 2 %
Eosinophils Absolute: 0 K/uL (ref 0.0–0.5)
Eosinophils Relative: 1 %
HCT: 39.9 % (ref 36.0–46.0)
Hemoglobin: 12.7 g/dL (ref 12.0–15.0)
Immature Granulocytes: 5 %
Lymphocytes Relative: 7 %
Lymphs Abs: 0.4 K/uL — ABNORMAL LOW (ref 0.7–4.0)
MCH: 24 pg — ABNORMAL LOW (ref 26.0–34.0)
MCHC: 31.8 g/dL (ref 30.0–36.0)
MCV: 75.3 fL — ABNORMAL LOW (ref 80.0–100.0)
Monocytes Absolute: 0.4 K/uL (ref 0.1–1.0)
Monocytes Relative: 8 %
Neutro Abs: 4.2 K/uL (ref 1.7–7.7)
Neutrophils Relative %: 77 %
Platelet Count: 491 K/uL — ABNORMAL HIGH (ref 150–400)
RBC: 5.3 MIL/uL — ABNORMAL HIGH (ref 3.87–5.11)
RDW: 19.4 % — ABNORMAL HIGH (ref 11.5–15.5)
WBC Count: 5.4 K/uL (ref 4.0–10.5)
nRBC: 0.4 % — ABNORMAL HIGH (ref 0.0–0.2)

## 2023-10-23 MED ORDER — IOHEXOL 9 MG/ML PO SOLN
ORAL | Status: AC
  Filled 2023-10-23: qty 1000

## 2023-10-23 MED ORDER — HEPARIN SOD (PORK) LOCK FLUSH 100 UNIT/ML IV SOLN
500.0000 [IU] | Freq: Once | INTRAVENOUS | Status: AC
  Administered 2023-10-23: 500 [IU] via INTRAVENOUS

## 2023-10-23 MED ORDER — IOHEXOL 300 MG/ML  SOLN
100.0000 mL | Freq: Once | INTRAMUSCULAR | Status: AC | PRN
  Administered 2023-10-23: 100 mL via INTRAVENOUS

## 2023-10-23 MED ORDER — IOHEXOL 9 MG/ML PO SOLN
500.0000 mL | ORAL | Status: AC
  Administered 2023-10-23 (×2): 500 mL via ORAL

## 2023-10-26 ENCOUNTER — Other Ambulatory Visit: Payer: Self-pay | Admitting: Family Medicine

## 2023-10-26 DIAGNOSIS — E039 Hypothyroidism, unspecified: Secondary | ICD-10-CM

## 2023-10-27 ENCOUNTER — Emergency Department (HOSPITAL_COMMUNITY)

## 2023-10-27 ENCOUNTER — Ambulatory Visit: Admitting: Family Medicine

## 2023-10-27 ENCOUNTER — Encounter (HOSPITAL_COMMUNITY): Payer: Self-pay

## 2023-10-27 ENCOUNTER — Other Ambulatory Visit: Payer: Self-pay

## 2023-10-27 ENCOUNTER — Telehealth: Payer: Self-pay

## 2023-10-27 ENCOUNTER — Inpatient Hospital Stay (HOSPITAL_COMMUNITY)
Admission: EM | Admit: 2023-10-27 | Discharge: 2023-10-30 | DRG: 397 | Disposition: A | Discharge status: Home / Self Care | Attending: Internal Medicine | Admitting: Internal Medicine

## 2023-10-27 DIAGNOSIS — K37 Unspecified appendicitis: Secondary | ICD-10-CM | POA: Diagnosis present

## 2023-10-27 DIAGNOSIS — E669 Obesity, unspecified: Secondary | ICD-10-CM | POA: Diagnosis present

## 2023-10-27 DIAGNOSIS — Z6832 Body mass index (BMI) 32.0-32.9, adult: Secondary | ICD-10-CM

## 2023-10-27 DIAGNOSIS — C8314 Mantle cell lymphoma, lymph nodes of axilla and upper limb: Secondary | ICD-10-CM | POA: Diagnosis present

## 2023-10-27 DIAGNOSIS — I10 Essential (primary) hypertension: Secondary | ICD-10-CM | POA: Diagnosis not present

## 2023-10-27 DIAGNOSIS — N179 Acute kidney failure, unspecified: Secondary | ICD-10-CM | POA: Diagnosis present

## 2023-10-27 DIAGNOSIS — C8313 Mantle cell lymphoma, intra-abdominal lymph nodes: Secondary | ICD-10-CM | POA: Diagnosis present

## 2023-10-27 DIAGNOSIS — R0902 Hypoxemia: Secondary | ICD-10-CM | POA: Diagnosis present

## 2023-10-27 DIAGNOSIS — Z860101 Personal history of adenomatous and serrated colon polyps: Secondary | ICD-10-CM

## 2023-10-27 DIAGNOSIS — Z83438 Family history of other disorder of lipoprotein metabolism and other lipidemia: Secondary | ICD-10-CM

## 2023-10-27 DIAGNOSIS — Z86718 Personal history of other venous thrombosis and embolism: Secondary | ICD-10-CM | POA: Diagnosis not present

## 2023-10-27 DIAGNOSIS — I130 Hypertensive heart and chronic kidney disease with heart failure and stage 1 through stage 4 chronic kidney disease, or unspecified chronic kidney disease: Secondary | ICD-10-CM | POA: Diagnosis present

## 2023-10-27 DIAGNOSIS — Z7901 Long term (current) use of anticoagulants: Secondary | ICD-10-CM

## 2023-10-27 DIAGNOSIS — C8310 Mantle cell lymphoma, unspecified site: Secondary | ICD-10-CM | POA: Diagnosis not present

## 2023-10-27 DIAGNOSIS — Z8679 Personal history of other diseases of the circulatory system: Secondary | ICD-10-CM

## 2023-10-27 DIAGNOSIS — I5020 Unspecified systolic (congestive) heart failure: Secondary | ICD-10-CM | POA: Diagnosis present

## 2023-10-27 DIAGNOSIS — T451X5A Adverse effect of antineoplastic and immunosuppressive drugs, initial encounter: Secondary | ICD-10-CM | POA: Diagnosis present

## 2023-10-27 DIAGNOSIS — I340 Nonrheumatic mitral (valve) insufficiency: Secondary | ICD-10-CM | POA: Diagnosis present

## 2023-10-27 DIAGNOSIS — Z8249 Family history of ischemic heart disease and other diseases of the circulatory system: Secondary | ICD-10-CM | POA: Diagnosis not present

## 2023-10-27 DIAGNOSIS — R7303 Prediabetes: Secondary | ICD-10-CM | POA: Diagnosis present

## 2023-10-27 DIAGNOSIS — R1031 Right lower quadrant pain: Secondary | ICD-10-CM | POA: Diagnosis present

## 2023-10-27 DIAGNOSIS — I5023 Acute on chronic systolic (congestive) heart failure: Secondary | ICD-10-CM | POA: Diagnosis not present

## 2023-10-27 DIAGNOSIS — Z7722 Contact with and (suspected) exposure to environmental tobacco smoke (acute) (chronic): Secondary | ICD-10-CM | POA: Diagnosis present

## 2023-10-27 DIAGNOSIS — Z79890 Hormone replacement therapy (postmenopausal): Secondary | ICD-10-CM

## 2023-10-27 DIAGNOSIS — Z79899 Other long term (current) drug therapy: Secondary | ICD-10-CM | POA: Diagnosis not present

## 2023-10-27 DIAGNOSIS — K353 Acute appendicitis with localized peritonitis, without perforation or gangrene: Principal | ICD-10-CM | POA: Diagnosis present

## 2023-10-27 DIAGNOSIS — Z7984 Long term (current) use of oral hypoglycemic drugs: Secondary | ICD-10-CM

## 2023-10-27 DIAGNOSIS — N183 Chronic kidney disease, stage 3 unspecified: Secondary | ICD-10-CM | POA: Diagnosis not present

## 2023-10-27 DIAGNOSIS — K3580 Unspecified acute appendicitis: Secondary | ICD-10-CM | POA: Diagnosis not present

## 2023-10-27 DIAGNOSIS — Z01810 Encounter for preprocedural cardiovascular examination: Secondary | ICD-10-CM | POA: Diagnosis not present

## 2023-10-27 DIAGNOSIS — E785 Hyperlipidemia, unspecified: Secondary | ICD-10-CM | POA: Diagnosis present

## 2023-10-27 DIAGNOSIS — C8319 Mantle cell lymphoma, extranodal and solid organ sites: Secondary | ICD-10-CM | POA: Diagnosis present

## 2023-10-27 DIAGNOSIS — K760 Fatty (change of) liver, not elsewhere classified: Secondary | ICD-10-CM | POA: Diagnosis present

## 2023-10-27 DIAGNOSIS — M810 Age-related osteoporosis without current pathological fracture: Secondary | ICD-10-CM | POA: Diagnosis present

## 2023-10-27 DIAGNOSIS — Z87891 Personal history of nicotine dependence: Secondary | ICD-10-CM | POA: Diagnosis not present

## 2023-10-27 DIAGNOSIS — J9601 Acute respiratory failure with hypoxia: Secondary | ICD-10-CM

## 2023-10-27 DIAGNOSIS — I5043 Acute on chronic combined systolic (congestive) and diastolic (congestive) heart failure: Secondary | ICD-10-CM | POA: Diagnosis present

## 2023-10-27 DIAGNOSIS — I700 Atherosclerosis of aorta: Secondary | ICD-10-CM | POA: Diagnosis present

## 2023-10-27 DIAGNOSIS — Z8349 Family history of other endocrine, nutritional and metabolic diseases: Secondary | ICD-10-CM

## 2023-10-27 DIAGNOSIS — I420 Dilated cardiomyopathy: Secondary | ICD-10-CM | POA: Diagnosis present

## 2023-10-27 DIAGNOSIS — Z86711 Personal history of pulmonary embolism: Secondary | ICD-10-CM

## 2023-10-27 DIAGNOSIS — E039 Hypothyroidism, unspecified: Secondary | ICD-10-CM | POA: Diagnosis present

## 2023-10-27 DIAGNOSIS — N1831 Chronic kidney disease, stage 3a: Secondary | ICD-10-CM | POA: Diagnosis present

## 2023-10-27 DIAGNOSIS — Z90710 Acquired absence of both cervix and uterus: Secondary | ICD-10-CM

## 2023-10-27 DIAGNOSIS — Z888 Allergy status to other drugs, medicaments and biological substances status: Secondary | ICD-10-CM

## 2023-10-27 LAB — COMPREHENSIVE METABOLIC PANEL WITH GFR
ALT: 8 U/L (ref 0–44)
AST: 21 U/L (ref 15–41)
Albumin: 3.7 g/dL (ref 3.5–5.0)
Alkaline Phosphatase: 154 U/L — ABNORMAL HIGH (ref 38–126)
Anion gap: 12 (ref 5–15)
BUN: 16 mg/dL (ref 8–23)
CO2: 28 mmol/L (ref 22–32)
Calcium: 9.6 mg/dL (ref 8.9–10.3)
Chloride: 96 mmol/L — ABNORMAL LOW (ref 98–111)
Creatinine, Ser: 1.73 mg/dL — ABNORMAL HIGH (ref 0.44–1.00)
GFR, Estimated: 32 mL/min — ABNORMAL LOW (ref >60)
Glucose, Bld: 87 mg/dL (ref 70–99)
Potassium: 3.8 mmol/L (ref 3.5–5.1)
Sodium: 136 mmol/L (ref 135–145)
Total Bilirubin: 0.5 mg/dL (ref 0.0–1.2)
Total Protein: 6.4 g/dL — ABNORMAL LOW (ref 6.5–8.1)

## 2023-10-27 LAB — URINALYSIS, ROUTINE W REFLEX MICROSCOPIC
Bilirubin Urine: NEGATIVE
Glucose, UA: >=500 mg/dL — AB
Hgb urine dipstick: NEGATIVE
Ketones, ur: NEGATIVE mg/dL
Leukocytes,Ua: NEGATIVE
Nitrite: NEGATIVE
Protein, ur: 30 mg/dL — AB
Specific Gravity, Urine: 1.018 (ref 1.005–1.030)
pH: 5 (ref 5.0–8.0)

## 2023-10-27 LAB — CBC WITH DIFFERENTIAL/PLATELET
Abs Immature Granulocytes: 0.12 K/uL — ABNORMAL HIGH (ref 0.00–0.07)
Basophils Absolute: 0.1 K/uL (ref 0.0–0.1)
Basophils Relative: 2 %
Eosinophils Absolute: 0 K/uL (ref 0.0–0.5)
Eosinophils Relative: 1 %
HCT: 46.8 % — ABNORMAL HIGH (ref 36.0–46.0)
Hemoglobin: 13.7 g/dL (ref 12.0–15.0)
Immature Granulocytes: 3 %
Lymphocytes Relative: 10 %
Lymphs Abs: 0.4 K/uL — ABNORMAL LOW (ref 0.7–4.0)
MCH: 22.8 pg — ABNORMAL LOW (ref 26.0–34.0)
MCHC: 29.3 g/dL — ABNORMAL LOW (ref 30.0–36.0)
MCV: 77.9 fL — ABNORMAL LOW (ref 80.0–100.0)
Monocytes Absolute: 0.5 K/uL (ref 0.1–1.0)
Monocytes Relative: 10 %
Neutro Abs: 3.2 K/uL (ref 1.7–7.7)
Neutrophils Relative %: 74 %
Platelets: 492 K/uL — ABNORMAL HIGH (ref 150–400)
RBC: 6.01 MIL/uL — ABNORMAL HIGH (ref 3.87–5.11)
RDW: 20.3 % — ABNORMAL HIGH (ref 11.5–15.5)
WBC: 4.3 K/uL (ref 4.0–10.5)
nRBC: 0 % (ref 0.0–0.2)

## 2023-10-27 LAB — LIPASE, BLOOD: Lipase: 27 U/L (ref 11–51)

## 2023-10-27 LAB — PRO BRAIN NATRIURETIC PEPTIDE: Pro Brain Natriuretic Peptide: 6917 pg/mL — ABNORMAL HIGH (ref <300.0)

## 2023-10-27 MED ORDER — FUROSEMIDE 10 MG/ML IJ SOLN
20.0000 mg | Freq: Once | INTRAMUSCULAR | Status: AC
  Administered 2023-10-27: 20 mg via INTRAVENOUS
  Filled 2023-10-27: qty 4

## 2023-10-27 MED ORDER — METRONIDAZOLE 500 MG/100ML IV SOLN
500.0000 mg | Freq: Once | INTRAVENOUS | Status: AC
Start: 2023-10-27 — End: 2023-10-27
  Administered 2023-10-27: 500 mg via INTRAVENOUS
  Filled 2023-10-27: qty 100

## 2023-10-27 MED ORDER — LACTATED RINGERS IV BOLUS: 1000.0000 mL | Freq: Once | INTRAVENOUS | Status: DC

## 2023-10-27 MED ORDER — BISACODYL 5 MG PO TBEC: 5.0000 mg | DELAYED_RELEASE_TABLET | Freq: Every day | ORAL | Status: DC | PRN

## 2023-10-27 MED ORDER — SODIUM CHLORIDE 0.9 % IV SOLN
1.0000 g | Freq: Once | INTRAVENOUS | Status: AC
  Administered 2023-10-27: 1 g via INTRAVENOUS
  Filled 2023-10-27: qty 10

## 2023-10-27 MED ORDER — ACETAMINOPHEN 325 MG PO TABS
650.0000 mg | ORAL_TABLET | Freq: Four times a day (QID) | ORAL | Status: DC | PRN
Start: 2023-10-27 — End: 2023-10-30

## 2023-10-27 MED ORDER — ONDANSETRON HCL 4 MG/2ML IJ SOLN: 4.0000 mg | Freq: Once | INTRAMUSCULAR | Status: DC

## 2023-10-27 MED ORDER — MELATONIN 3 MG PO TABS: 3.0000 mg | ORAL_TABLET | Freq: Every evening | ORAL | Status: DC | PRN

## 2023-10-27 MED ORDER — MORPHINE SULFATE (PF) 4 MG/ML IV SOLN: 4.0000 mg | Freq: Once | INTRAVENOUS | Status: DC

## 2023-10-27 MED ORDER — IOHEXOL 9 MG/ML PO SOLN
ORAL | Status: AC
  Filled 2023-10-27: qty 1000

## 2023-10-27 MED ORDER — ACETAMINOPHEN 650 MG RE SUPP: 650.0000 mg | Freq: Four times a day (QID) | RECTAL | Status: DC | PRN

## 2023-10-27 MED ORDER — IOHEXOL 300 MG/ML  SOLN
80.0000 mL | Freq: Once | INTRAMUSCULAR | Status: AC | PRN
Start: 2023-10-27 — End: 2023-10-27
  Administered 2023-10-27: 80 mL via INTRAVENOUS

## 2023-10-27 MED ORDER — ONDANSETRON HCL 4 MG/2ML IJ SOLN: 4.0000 mg | Freq: Four times a day (QID) | INTRAMUSCULAR | Status: DC | PRN

## 2023-10-27 MED ORDER — MORPHINE SULFATE (PF) 4 MG/ML IV SOLN
4.0000 mg | INTRAVENOUS | Status: DC | PRN
  Administered 2023-10-30: 4 mg via INTRAVENOUS
  Filled 2023-10-27: qty 1

## 2023-10-27 NOTE — Progress Notes (Deleted)
 OFFICE VISIT  10/27/2023  CC: No chief complaint on file.   Patient is a 68 y.o. female who presents for 58-month follow-up hypercholesterolemia, hypokalemia, prediabetes, and hypothyroidism. A/P as of last visit:  #1 hypothyroidism. She remains on amiodarone . Continue levothyroxine  100 mcg daily. Monitor TSH today.   #2 hypercholesterolemia. She is intolerant of multiple statins. Continue with diet and exercise only at this time.   3.  Prediabetes.  Good diet, great exercise. Monitor hemoglobin A1c today.   4.  Acute kidney injury/dehydration. About 3 weeks ago her serum creatinine elevated to 1.33.  She had mild low potassium at that time as well.  Her torsemide  and potassium doses were cut in half.  She got IV fluids at that time as well. Monitor bmet today.   5. Secondary nonischemic cardiomyopathy (adriamycin ).  Stable class II. Followed by CHF clinic: Current regimen Farxiga  10 mg a day, Corlanor  7.5 mg twice daily, losartan  12.5 mg daily, spironolactone  25 mg daily, and torsemide  20 mg daily. Doing excellent with cardiac rehab.   5 history of V. tach, PVCs. Continue amiodarone  100 mg a day as per cardiology. TSH monitoring today.  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 CHOP and others   Obesity    BMI 34   Osteoporosis    most recent DEXA 02/2022   Pre-diabetes    Secondary nonischemic congestive cardiomyopathy (HCC)    Adriamycin  2020   Ventricular tachycardia (HCC)    Vitamin D  deficiency     Past Surgical History:  Procedure Laterality Date   ABDOMINAL HYSTERECTOMY  1997   CESAREAN  SECTION  1981   CHOLECYSTECTOMY N/A 09/25/2020   Procedure: LAPAROSCOPIC CHOLECYSTECTOMY;  Surgeon: Stevie Herlene Righter, MD;  Location: [facility] ORS;  Service: General;  Laterality: N/A;  60 MINUTES   COLONOSCOPY     03/2018 NO POLYPS   COLONOSCOPY W/ BIOPSIES  12/29/2006   IR IMAGING GUIDED PORT INSERTION  01/15/2019   LAPAROSCOPY     1991 for emdometriosis   RIGHT/LEFT HEART CATH AND CORONARY ANGIOGRAPHY N/A 06/25/2019   Procedure: RIGHT/LEFT HEART CATH AND CORONARY ANGIOGRAPHY;  Surgeon: Cherrie Toribio SAUNDERS, MD;  Location: [facility] INVASIVE CV LAB;  Service: Cardiovascular;  Laterality: N/A;   TEE WITHOUT CARDIOVERSION N/A 07/04/2021   Procedure: TRANSESOPHAGEAL ECHOCARDIOGRAM (TEE);  Surgeon: Cherrie Toribio SAUNDERS, MD;  Location: [facility] ENDOSCOPY;  Service: Cardiovascular;  Laterality: N/A;   TRANSTHORACIC ECHOCARDIOGRAM     05/17/22 ejection fraction 35% (unchanged from 2023), global hypokinesis, mild RV dysfunction, mild to moderate MR.   UPPER GASTROINTESTINAL ENDOSCOPY      Outpatient Medications Prior to Visit  Medication Sig Dispense Refill   amiodarone  (PACERONE ) 200 MG tablet TAKE 1/2 TABLET BY MOUTH DAILY 45 tablet 3   Cholecalciferol  (VITAMIN D -3) 25 MCG (1000 UT) CAPS Take 1 capsule by mouth daily.     ELIQUIS  5 MG TABS tablet TAKE 1 TABLET BY MOUTH TWICE A DAY 60 tablet 6   estradiol  (VIVELLE -DOT) 0.05 MG/24HR patch Place 1 patch onto the skin 2 (two) times a week. Tuesdays & Fridays  FARXIGA  10 MG TABS tablet TAKE 1 TABLET BY MOUTH EVERY DAY 30 tablet 11   ivabradine  (CORLANOR ) 7.5 MG TABS tablet Take 1 tablet (7.5 mg total) by mouth 2 (two) times daily with a meal. 60 tablet 11   levothyroxine  (SYNTHROID ) 100 MCG tablet TAKE 1 TABLET BY MOUTH DAILY ON AN EMPTY STOMACH WITH ONLY WATER FOR 30 MINUTES & NO ANTACID/CALCIUM /MAGNESIUM  FOR 4 HOURS & AVOID BIOTIN 30 tablet 0   losartan  (COZAAR ) 25 MG tablet Take 1 tablet (25 mg total) by mouth daily. 90 tablet 3   ondansetron  (ZOFRAN -ODT) 4 MG  disintegrating tablet Take 1 tablet (4 mg total) by mouth every 8 (eight) hours as needed for nausea or vomiting. 30 tablet 3   potassium chloride  (KLOR-CON ) 10 MEQ tablet 2 tabs po qd     pravastatin  (PRAVACHOL ) 20 MG tablet TAKE 1 TABLET BY MOUTH EVERY DAY 90 tablet 0   prochlorperazine  (COMPAZINE ) 10 MG tablet TAKE 1 TABLET BY MOUTH EVERY 6 HOURS AS NEEDED FOR NAUSEA OR VOMITING. 30 tablet 0   spironolactone  (ALDACTONE ) 25 MG tablet TAKE 1 TABLET BY MOUTH EVERY DAY 90 tablet 3   temazepam  (RESTORIL ) 30 MG capsule Take 1 capsule (30 mg total) by mouth at bedtime as needed for sleep. 30 capsule 0   torsemide  (DEMADEX ) 20 MG tablet Take 1 tablet (20 mg total) by mouth daily.     No facility-administered medications prior to visit.    Allergies  Allergen Reactions   Adriamycin  [Doxorubicin ] Other (See Comments)    Acute heart failure (chemo from Jan - May 2021)   Ppd [Tuberculin Purified Protein Derivative] Swelling    Positive reaction.  Negative Chest Xray 06/16/12    Review of Systems As per HPI  PE:    09/18/2023    2:46 PM 07/07/2023    9:51 AM 06/13/2023    3:41 PM  Vitals with BMI  Height 5' 2 5' 2 5' 2  Weight 170 lbs 10 oz 179 lbs 13 oz 179 lbs 7 oz  BMI 31.2 32.88 32.81  Systolic 118 95 102  Diastolic 74 61 58  Pulse 70 82 76     Physical Exam  ***  LABS:  Last CBC Lab Results  Component Value Date   WBC 5.4 10/23/2023   HGB 12.7 10/23/2023   HCT 39.9 10/23/2023   MCV 75.3 (L) 10/23/2023   MCH 24.0 (L) 10/23/2023   RDW 19.4 (H) 10/23/2023   PLT 491 (H) 10/23/2023   Last metabolic panel Lab Results  Component Value Date   GLUCOSE 83 10/23/2023   NA 135 10/23/2023   K 3.4 (L) 10/23/2023   CL 93 (L) 10/23/2023   CO2 33 (H) 10/23/2023   BUN 17 10/23/2023   CREATININE 1.42 (H) 10/23/2023   GFRNONAA 41 (L) 10/23/2023   CALCIUM  9.3 10/23/2023   PHOS 3.8 06/25/2019   PROT 6.1 (L) 10/23/2023   ALBUMIN 3.5 10/23/2023   BILITOT 0.6 10/23/2023    ALKPHOS 129 (H) 10/23/2023   AST 13 (L) 10/23/2023   ALT 9 10/23/2023   ANIONGAP 9 10/23/2023   Last lipids Lab Results  Component Value Date   CHOL 254 (H) 12/13/2022   HDL 67 12/13/2022   LDLCALC 165 (H) 12/13/2022   TRIG 107 12/13/2022   CHOLHDL 3.8 12/13/2022   Last hemoglobin A1c Lab Results  Component Value Date   HGBA1C 5.8 07/07/2023   Last thyroid  functions Lab Results  Component Value Date   TSH 0.712  09/18/2023   T4TOTAL 5.3 06/24/2019   FREET4 1.49 (H) 09/18/2023   Last vitamin D  Lab Results  Component Value Date   VD25OH 22 (L) 12/12/2021   Last vitamin B12 and Folate Lab Results  Component Value Date   VITAMINB12 406 12/13/2022   IMPRESSION AND PLAN:  No problem-specific Assessment & Plan notes found for this encounter.  No labs needed  An After Visit Summary was printed and given to the patient.  FOLLOW UP: No follow-ups on file.  Signed:  Gerlene Hockey, MD           10/27/2023

## 2023-10-27 NOTE — H&P (Signed)
 History and Physical    Patient: Natalie Baker FMW:[id number] DOB: 03-08-1955 DOA: 10/27/2023 DOS: the patient was seen and examined on 10/27/2023 PCP: [name], MD  Patient coming from: Home  Chief Complaint:  Chief Complaint  Patient presents with   Abdominal Pain   HPI: Natalie Baker is a 68 y.o. 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 The patient had an outpatient CT scan of the abdomen pelvis ordered by her oncologist Dr. Gatha 4 days ago.  The patient was called and told to come to the ER when the CT showed appendicitis. The patient reports that she has had right lower quadrant abdominal pain for 2 weeks.  She has had some nausea and vomiting and fever and chills but the fever and chills seem to have subsided. She also reports some cough and mild increased sob w exertion. In the ED the patient was given IV ceftriaxone  and Zofran  and morphine .  Her chest x-ray did reveal CHF.  She had mild symptoms and an elevated BNP so she was also treated with IV Lasix . General surgery has seen the patient.  Another CT of the abdomen pelvis has been ordered.   The plan right now is for her to go to the operating room in 48 hours once her Eliquis  has worn off. The hospitalist service will admit the patient and manage her congestive heart failure.   Review of Systems: As mentioned in the history of present illness. All other systems reviewed and are negative. 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 CHOP and others   Obesity    BMI 34   Osteoporosis    most recent DEXA 02/2022   Pre-diabetes    Secondary nonischemic congestive cardiomyopathy (HCC)    Adriamycin  2020   Ventricular tachycardia (HCC)    Vitamin D  deficiency    Past Surgical History:  Procedure Laterality Date   ABDOMINAL HYSTERECTOMY  1997   CESAREAN SECTION  1981   CHOLECYSTECTOMY N/A 09/25/2020   Procedure: LAPAROSCOPIC CHOLECYSTECTOMY;  Surgeon: Stevie Herlene Righter, MD;  Location: [facility];  Service: General;  Laterality: N/A;  60 MINUTES   COLONOSCOPY     03/2018 NO POLYPS   COLONOSCOPY W/ BIOPSIES  12/29/2006   IR IMAGING GUIDED PORT INSERTION  01/15/2019   LAPAROSCOPY     1991 for emdometriosis   RIGHT/LEFT HEART CATH AND CORONARY ANGIOGRAPHY N/A 06/25/2019   Procedure: RIGHT/LEFT HEART CATH AND CORONARY ANGIOGRAPHY;  Surgeon: Cherrie Toribio SAUNDERS, MD;  Location: [facility];  Service: Cardiovascular;  Laterality: N/A;   TEE WITHOUT CARDIOVERSION N/A 07/04/2021   Procedure: TRANSESOPHAGEAL ECHOCARDIOGRAM (TEE);  Surgeon: Cherrie Toribio SAUNDERS, MD;  Location: [facility] ENDOSCOPY;  Service: Cardiovascular;  Laterality: N/A;   TRANSTHORACIC ECHOCARDIOGRAM     05/17/22 ejection fraction 35% (unchanged from 2023), global hypokinesis, mild RV dysfunction, mild to moderate MR.   UPPER GASTROINTESTINAL ENDOSCOPY     Social History:  reports that she quit smoking about 27 years ago. Her smoking use included cigarettes. She started smoking about 42 years ago. She has never used smokeless tobacco. She reports that she does not drink alcohol and does not use drugs.  Allergies  Allergen Reactions   Adriamycin  [Doxorubicin ] Other (See Comments)    Acute heart failure (chemo from Jan - May 2021)   Ppd [Tuberculin Purified Protein Derivative] Swelling    Positive reaction.  Negative Chest Xray 06/16/12    Family History  Problem Relation Age of  Onset   Heart disease Mother 19       chf   Diabetes Mother    Thyroid  disease Mother    Cancer Mother 11       female/ skin   Hypertension Mother    Liver disease Mother    Hypertension Father    Alcohol abuse Father    Heart disease Father    Breast cancer Sister 79   Diabetes Sister    Hypothyroidism Sister    Hashimoto's thyroiditis Sister    Hyperlipidemia Brother    Hypertension Brother        x 2   Diabetes Brother    Hypothyroidism Brother    Prostate cancer Brother 81   Liver disease Maternal Grandmother    Diabetes Paternal Grandmother    Breast cancer Maternal Aunt    Hyperlipidemia Maternal Aunt    Heart disease Maternal Aunt    Bone cancer Maternal Aunt        metastatic   Breast cancer Niece 28   Colon cancer Neg Hx    Colon polyps Neg Hx    Esophageal cancer Neg Hx    Rectal cancer Neg Hx    Stomach cancer Neg Hx     Prior to Admission medications   Medication Sig Start Date End Date Taking? Authorizing Provider  amiodarone  (PACERONE ) 200 MG tablet TAKE 1/2 TABLET BY MOUTH DAILY 08/06/23  Yes Bensimhon, Toribio SAUNDERS, MD  ELIQUIS  5 MG TABS tablet TAKE 1 TABLET BY MOUTH TWICE A DAY 10/13/23  Yes Bensimhon, Toribio SAUNDERS, MD  estradiol  (CLIMARA  - DOSED IN MG/24 HR) 0.0375 mg/24hr patch Place 0.0375 mg onto the skin 2 (two) times a week. Tuesdays & Fridays 08/08/20  Yes [provider]  FARXIGA  10 MG TABS tablet TAKE 1 TABLET BY MOUTH EVERY DAY 06/03/23  Yes Bensimhon, Toribio SAUNDERS, MD  ivabradine  (CORLANOR ) 7.5 MG TABS tablet Take 1 tablet (7.5 mg total) by mouth 2 (two) times daily with a meal. 06/04/23  Yes Bensimhon, Toribio SAUNDERS, MD  levothyroxine  (SYNTHROID ) 100 MCG tablet TAKE 1 TABLET BY MOUTH DAILY ON AN EMPTY STOMACH WITH ONLY WATER FOR 30 MINUTES & NO ANTACID/CALCIUM /MAGNESIUM  FOR 4 HOURS & AVOID BIOTIN 10/03/23  Yes McGowen, Aleene DEL, MD  losartan  (COZAAR ) 25 MG tablet Take 1 tablet (25 mg total) by mouth daily. Patient taking differently: Take 12.5 mg by mouth  at bedtime. 09/18/23  Yes Bensimhon, Toribio SAUNDERS, MD  ondansetron  (ZOFRAN -ODT) 4 MG disintegrating tablet Take 1 tablet (4 mg total) by mouth every 8 (eight) hours as needed for nausea or vomiting. 03/27/23  Yes Sherrod Sherrod, MD  potassium chloride  (KLOR-CON ) 10 MEQ tablet 2 tabs po qd Patient taking differently: Take 20 mEq by mouth daily at 12 noon. Takes with torsemide  07/07/23  Yes McGowen, Aleene DEL, MD  prochlorperazine  (COMPAZINE ) 10 MG tablet TAKE 1 TABLET BY MOUTH EVERY 6 HOURS AS NEEDED FOR NAUSEA OR VOMITING. 04/13/21  Yes Mohamed,  Mohamed, MD  spironolactone  (ALDACTONE ) 25 MG tablet TAKE 1 TABLET BY MOUTH EVERY DAY Patient taking differently: Take 12.5 mg by mouth daily. 08/06/23  Yes Bensimhon, Toribio SAUNDERS, MD  temazepam  (RESTORIL ) 30 MG capsule Take 1 capsule (30 mg total) by mouth at bedtime as needed for sleep. 10/20/23  Yes Sherrod Sherrod, MD  torsemide  (DEMADEX ) 20 MG tablet Take 1 tablet (20 mg total) by mouth daily. Patient taking differently: Take 20 mg by mouth daily at 12 noon. 07/07/23  Yes [name], MD    Physical Exam: Vitals:   10/27/23 1730 10/27/23 1736 10/27/23 1737 10/27/23 2015  BP: 106/83 (!) 97/59  107/60  Pulse: 71 68 69 72  Resp: 17 20 18 14   Temp:  99.1 F (37.3 C)    TempSrc:  Oral    SpO2: 92%   97%   Physical Exam:  General: No acute distress, well developed, well nourished HEENT: Normocephalic, atraumatic, PERRL Cardiovascular: Normal rate and rhythm. Distal pulses intact. Pulmonary: Normal pulmonary effort, normal breath sounds Gastrointestinal: Nondistended abdomen, soft, tender in RLQ, hypoactive bowel sounds Musculoskeletal:Normal ROM, no lower ext edema Skin: Skin is warm and dry. Neuro: No focal deficits noted, AAOx3. PSYCH: Attentive and cooperative  Data Reviewed:  Results for orders placed or performed during the hospital encounter of 10/27/23 (from the past 24 hours)  Comprehensive metabolic panel     Status: Abnormal    Collection Time: 10/27/23  2:34 PM  Result Value Ref Range   Sodium 136 135 - 145 mmol/L   Potassium 3.8 3.5 - 5.1 mmol/L   Chloride 96 (L) 98 - 111 mmol/L   CO2 28 22 - 32 mmol/L   Glucose, Bld 87 70 - 99 mg/dL   BUN 16 8 - 23 mg/dL   Creatinine, Ser 8.26 (H) 0.44 - 1.00 mg/dL   Calcium  9.6 8.9 - 10.3 mg/dL   Total Protein 6.4 (L) 6.5 - 8.1 g/dL   Albumin 3.7 3.5 - 5.0 g/dL   AST 21 15 - 41 U/L   ALT 8 0 - 44 U/L   Alkaline Phosphatase 154 (H) 38 - 126 U/L   Total Bilirubin 0.5 0.0 - 1.2 mg/dL   GFR, Estimated 32 (L) >60 mL/min   Anion gap 12 5 - 15  Lipase, blood     Status: None   Collection Time: 10/27/23  2:34 PM  Result Value Ref Range   Lipase 27 11 - 51 U/L  CBC with Diff     Status: Abnormal   Collection Time: 10/27/23  2:34 PM  Result Value Ref Range   WBC 4.3 4.0 - 10.5 K/uL   RBC 6.01 (H) 3.87 - 5.11 MIL/uL   Hemoglobin 13.7 12.0 - 15.0 g/dL   HCT 53.1 (H) 63.9 - 53.9 %   MCV 77.9 (L) 80.0 - 100.0 fL   MCH 22.8 (L) 26.0 - 34.0 pg   MCHC 29.3 (L) 30.0 - 36.0 g/dL   RDW 79.6 (H) 88.4 - 84.4 %   Platelets 492 (H) 150 - 400 K/uL   nRBC 0.0 0.0 - 0.2 %   Neutrophils Relative % 74 %   Neutro Abs 3.2 1.7 - 7.7 K/uL   Lymphocytes Relative 10 %   Lymphs Abs 0.4 (L) 0.7 - 4.0 K/uL   Monocytes Relative 10 %   Monocytes Absolute 0.5 0.1 - 1.0 K/uL   Eosinophils Relative 1 %   Eosinophils Absolute 0.0 0.0 - 0.5 K/uL   Basophils Relative 2 %  Basophils Absolute 0.1 0.0 - 0.1 K/uL   Immature Granulocytes 3 %   Abs Immature Granulocytes 0.12 (H) 0.00 - 0.07 K/uL  Urinalysis, Routine w reflex microscopic -Urine, Clean Catch     Status: Abnormal   Collection Time: 10/27/23  2:34 PM  Result Value Ref Range   Color, Urine YELLOW YELLOW   APPearance HAZY (A) CLEAR   Specific Gravity, Urine 1.018 1.005 - 1.030   pH 5.0 5.0 - 8.0   Glucose, UA >=500 (A) NEGATIVE mg/dL   Hgb urine dipstick NEGATIVE NEGATIVE   Bilirubin Urine NEGATIVE NEGATIVE   Ketones, ur NEGATIVE  NEGATIVE mg/dL   Protein, ur 30 (A) NEGATIVE mg/dL   Nitrite NEGATIVE NEGATIVE   Leukocytes,Ua NEGATIVE NEGATIVE   RBC / HPF 0-5 0 - 5 RBC/hpf   WBC, UA 11-20 0 - 5 WBC/hpf   Bacteria, UA MANY (A) NONE SEEN   Squamous Epithelial / HPF 6-10 0 - 5 /HPF   Hyaline Casts, UA PRESENT   Pro Brain natriuretic peptide     Status: Abnormal   Collection Time: 10/27/23  2:34 PM  Result Value Ref Range   Pro Brain Natriuretic Peptide 6,917.0 (H) <300.0 pg/mL     Assessment and Plan: Appendicitis - appreciate general surgery recommendations: - Hold Eliquis  - IV Cipro - Possible surgery in 48 Hours.  Repeat  CT abdomen pelvis ordered.  2. Mild CHF exacerbation - BNP is extremely elevated at greater than 6000 but the patient was not significantly short of breath during my examination. She had already received a dose of IV Lasix  prior to my exam.  - Cardiology has been consulted for preop assessment and assistance in management. - Continue Amiodarone , Farxiga , Losartan  - IV Lasix  instead of oral Torsemide  and spironolactone .  3. Acute on chronic renal failure - Mild - Follow creatinine.  Today Cr=1.73.  Baseline Cr= 1.43.  4. Chronic mantle cell lymphoma - No cure per patient report, chronic low level.  5. Hypothyroidism - Continue Synthroid .   Advance Care Planning:   Code Status: Prior  The patient names her husband as a Runner, broadcasting/film/video and she wants to be full code.  Consults: General Surgery  Family Communication: Husband at bedside  Severity of Illness: The appropriate patient status for this patient is INPATIENT. Inpatient status is judged to be reasonable and necessary in order to provide the required intensity of service to ensure the patient's safety. The patient's presenting symptoms, physical exam findings, and initial radiographic and laboratory data in the context of their chronic comorbidities is felt to place them at high risk for further clinical deterioration.  Furthermore, it is not anticipated that the patient will be medically stable for discharge from the hospital within 2 midnights of admission.   * I certify that at the point of admission it is my clinical judgment that the patient will require inpatient hospital care spanning beyond 2 midnights from the point of admission due to high intensity of service, high risk for further deterioration and high frequency of surveillance required.*  Author: ARTHEA CHILD, MD 10/27/2023 8:50 PM  For on call review [email or web address].

## 2023-10-27 NOTE — Telephone Encounter (Signed)
 Tried to reach patient in regards to CT scan results.  Per [name], PA, CT scan showed concern for appendicitis and I was calling to check in on the patient for symptoms of abdominal pain (RLQ or central), nausea or vomiting, appetite change, fever, or  abdominal bloating. If so, she may need to be evaluated in the ER.    Asked for a return call for the information above.

## 2023-10-27 NOTE — ED Triage Notes (Signed)
 Pt reports with RLQ pain x 1 week. Pt reports having a ct and it showed appendicitis.

## 2023-10-27 NOTE — Telephone Encounter (Signed)
 Spoke with patient regarding symptoms consistent with appendicitis noted on CT scan from 10/23/23. Patient reported that last week she began experiencing nausea, vomiting, right lower quadrant (RLQ) pain, tenderness, soreness to touch, and fever. She stated these symptoms have occurred more than once. Today, she reports continued nausea and RLQ pain rated 8/10, sore and tender to touch, but denies fever.  Informed patient per [name], PA, that she needs to return to the ER for evaluation for possible surgical removal of the appendix. Patient voiced understanding.

## 2023-10-27 NOTE — Consult Note (Signed)
 Admitting Physician: Deward PARAS Shakeria Robinette  Service: General Surgery  CC: Abdominal pain  Subjective   HPI: Natalie Baker is an 68 y.o. female who is here for abdominal pain.  She says she has had multiple episodes of abdominal pain similar to this.  The pain starts in her supraumbilical/epigastric area and eventually settles in the right lower quadrant.  This episode got to the point where she almost presented to the ER, but decided not to.  She got a screening CT chest/abd/pel for her history of Mantle Cell Lymphoma and was noted to have signs of acute appendicitis on that CT.  This was four days prior to presentation.  She continues to have right lower quadrant abdominal pain.  She took Eliquis  this morning.  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 CHOP and others   Obesity    BMI 34   Osteoporosis    most recent DEXA 02/2022   Pre-diabetes    Secondary nonischemic congestive cardiomyopathy (HCC)    Adriamycin  2020   Ventricular tachycardia (HCC)    Vitamin D  deficiency     Past Surgical History:  Procedure Laterality Date   ABDOMINAL HYSTERECTOMY  1997   CESAREAN SECTION  1981   CHOLECYSTECTOMY N/A 09/25/2020   Procedure: LAPAROSCOPIC CHOLECYSTECTOMY;  Surgeon: Stevie Herlene Righter, MD;  Location: [facility];  Service: General;  Laterality: N/A;  60 MINUTES   COLONOSCOPY     03/2018 NO POLYPS   COLONOSCOPY W/ BIOPSIES  12/29/2006   IR IMAGING GUIDED PORT INSERTION  01/15/2019   LAPAROSCOPY     1991 for emdometriosis   RIGHT/LEFT HEART CATH AND CORONARY ANGIOGRAPHY N/A 06/25/2019   Procedure: RIGHT/LEFT HEART CATH AND CORONARY  ANGIOGRAPHY;  Surgeon: Cherrie Toribio SAUNDERS, MD;  Location: [facility];  Service: Cardiovascular;  Laterality: N/A;   TEE WITHOUT CARDIOVERSION N/A 07/04/2021   Procedure: TRANSESOPHAGEAL ECHOCARDIOGRAM (TEE);  Surgeon: Cherrie Toribio SAUNDERS, MD;  Location: [facility];  Service: Cardiovascular;  Laterality: N/A;   TRANSTHORACIC ECHOCARDIOGRAM     05/17/22 ejection fraction 35% (unchanged from 2023), global hypokinesis, mild RV dysfunction, mild to moderate MR.   UPPER GASTROINTESTINAL ENDOSCOPY      Family History  Problem Relation Age of Onset   Heart disease Mother 35       chf   Diabetes Mother    Thyroid  disease Mother    Cancer Mother 59       female/ skin   Hypertension Mother    Liver disease Mother    Hypertension Father    Alcohol abuse Father    Heart disease Father    Breast cancer Sister 51   Diabetes Sister    Hypothyroidism Sister    Hashimoto's thyroiditis Sister    Hyperlipidemia Brother    Hypertension Brother        x 2   Diabetes Brother    Hypothyroidism Brother    Prostate cancer Brother 67   Liver disease Maternal Grandmother    Diabetes Paternal Grandmother    Breast cancer Maternal Aunt    Hyperlipidemia Maternal Aunt    Heart  disease Maternal Aunt    Bone cancer Maternal Aunt        metastatic   Breast cancer Niece 31   Colon cancer Neg Hx    Colon polyps Neg Hx    Esophageal cancer Neg Hx    Rectal cancer Neg Hx    Stomach cancer Neg Hx     Social:  reports that she quit smoking about 27 years ago. Her smoking use included cigarettes. She started smoking about 42 years ago. She has never used smokeless tobacco. She reports that she does not drink alcohol and does not use drugs.  Allergies:  Allergies  Allergen Reactions   Adriamycin  [Doxorubicin ] Other (See Comments)    Acute heart failure (chemo from Jan - May 2021)   Ppd [Tuberculin Purified Protein Derivative] Swelling    Positive reaction.  Negative Chest Xray 06/16/12     Medications: Current Outpatient Medications  Medication Instructions   amiodarone  (PACERONE ) 100 mg, Oral, Daily   Cholecalciferol  (VITAMIN D -3) 25 MCG (1000 UT) CAPS 1 capsule, Daily   Eliquis  5 mg, Oral, 2 times daily   estradiol  (VIVELLE -DOT) 0.05 MG/24HR patch 1 patch, 2 times weekly   Farxiga  10 mg, Oral, Daily   ivabradine  (CORLANOR ) 7.5 mg, Oral, 2 times daily with meals   levothyroxine  (SYNTHROID ) 100 MCG tablet TAKE 1 TABLET BY MOUTH DAILY ON AN EMPTY STOMACH WITH ONLY WATER FOR 30 MINUTES & NO ANTACID/CALCIUM /MAGNESIUM  FOR 4 HOURS & AVOID BIOTIN   losartan  (COZAAR ) 25 mg, Oral, Daily   ondansetron  (ZOFRAN -ODT) 4 mg, Oral, Every 8 hours PRN   potassium chloride  (KLOR-CON ) 10 MEQ tablet 2 tabs po qd   pravastatin  (PRAVACHOL ) 20 mg, Oral, Daily   prochlorperazine  (COMPAZINE ) 10 MG tablet TAKE 1 TABLET BY MOUTH EVERY 6 HOURS AS NEEDED FOR NAUSEA OR VOMITING.   spironolactone  (ALDACTONE ) 25 mg, Oral, Daily   temazepam  (RESTORIL ) 30 mg, Oral, At bedtime PRN   torsemide  (DEMADEX ) 20 mg, Oral, Daily    ROS - all of the below systems have been reviewed with the patient and positives are indicated with bold text General: chills, fever or night sweats Eyes: blurry vision or double vision ENT: epistaxis or sore throat Allergy/Immunology: itchy/watery eyes or nasal congestion Hematologic/Lymphatic: bleeding problems, blood clots or swollen lymph nodes Endocrine: temperature intolerance or unexpected weight changes Breast: new or changing breast lumps or nipple discharge Resp: cough, shortness of breath, or wheezing CV: chest pain or dyspnea on exertion GI: as per HPI GU: dysuria, trouble voiding, or hematuria MSK: joint pain or joint stiffness Neuro: TIA or stroke symptoms Derm: pruritus and skin lesion changes Psych: anxiety and depression  Objective   PE Blood pressure 98/72, pulse 72, temperature 97.7 F (36.5 C), temperature source Oral, resp. rate 20, SpO2  95%. Constitutional: NAD; conversant; no deformities Eyes: Moist conjunctiva; no lid lag; anicteric; PERRL Neck: Trachea midline; no thyromegaly Lungs: Normal respiratory effort; no tactile fremitus CV: RRR; no palpable thrills; no pitting edema GI: Abd soft, tender RLQ; no palpable hepatosplenomegaly MSK: Normal range of motion of extremities; no clubbing/cyanosis Psychiatric: Appropriate affect; alert and oriented x3 Lymphatic: No palpable cervical or axillary lymphadenopathy  Results for orders placed or performed during the hospital encounter of 10/27/23 (from the past 24 hours)  Comprehensive metabolic panel     Status: Abnormal   Collection Time: 10/27/23  2:34 PM  Result Value Ref Range   Sodium 136 135 - 145 mmol/L   Potassium 3.8 3.5 - 5.1 mmol/L  Chloride 96 (L) 98 - 111 mmol/L   CO2 28 22 - 32 mmol/L   Glucose, Bld 87 70 - 99 mg/dL   BUN 16 8 - 23 mg/dL   Creatinine, Ser 8.26 (H) 0.44 - 1.00 mg/dL   Calcium  9.6 8.9 - 10.3 mg/dL   Total Protein 6.4 (L) 6.5 - 8.1 g/dL   Albumin 3.7 3.5 - 5.0 g/dL   AST 21 15 - 41 U/L   ALT 8 0 - 44 U/L   Alkaline Phosphatase 154 (H) 38 - 126 U/L   Total Bilirubin 0.5 0.0 - 1.2 mg/dL   GFR, Estimated 32 (L) >60 mL/min   Anion gap 12 5 - 15  Lipase, blood     Status: None   Collection Time: 10/27/23  2:34 PM  Result Value Ref Range   Lipase 27 11 - 51 U/L  CBC with Diff     Status: Abnormal   Collection Time: 10/27/23  2:34 PM  Result Value Ref Range   WBC 4.3 4.0 - 10.5 K/uL   RBC 6.01 (H) 3.87 - 5.11 MIL/uL   Hemoglobin 13.7 12.0 - 15.0 g/dL   HCT 53.1 (H) 63.9 - 53.9 %   MCV 77.9 (L) 80.0 - 100.0 fL   MCH 22.8 (L) 26.0 - 34.0 pg   MCHC 29.3 (L) 30.0 - 36.0 g/dL   RDW 79.6 (H) 88.4 - 84.4 %   Platelets 492 (H) 150 - 400 K/uL   nRBC 0.0 0.0 - 0.2 %   Neutrophils Relative % 74 %   Neutro Abs 3.2 1.7 - 7.7 K/uL   Lymphocytes Relative 10 %   Lymphs Abs 0.4 (L) 0.7 - 4.0 K/uL   Monocytes Relative 10 %   Monocytes Absolute 0.5  0.1 - 1.0 K/uL   Eosinophils Relative 1 %   Eosinophils Absolute 0.0 0.0 - 0.5 K/uL   Basophils Relative 2 %   Basophils Absolute 0.1 0.0 - 0.1 K/uL   Immature Granulocytes 3 %   Abs Immature Granulocytes 0.12 (H) 0.00 - 0.07 K/uL  Urinalysis, Routine w reflex microscopic -Urine, Clean Catch     Status: Abnormal   Collection Time: 10/27/23  2:34 PM  Result Value Ref Range   Color, Urine YELLOW YELLOW   APPearance HAZY (A) CLEAR   Specific Gravity, Urine 1.018 1.005 - 1.030   pH 5.0 5.0 - 8.0   Glucose, UA >=500 (A) NEGATIVE mg/dL   Hgb urine dipstick NEGATIVE NEGATIVE   Bilirubin Urine NEGATIVE NEGATIVE   Ketones, ur NEGATIVE NEGATIVE mg/dL   Protein, ur 30 (A) NEGATIVE mg/dL   Nitrite NEGATIVE NEGATIVE   Leukocytes,Ua NEGATIVE NEGATIVE   RBC / HPF 0-5 0 - 5 RBC/hpf   WBC, UA 11-20 0 - 5 WBC/hpf   Bacteria, UA MANY (A) NONE SEEN   Squamous Epithelial / HPF 6-10 0 - 5 /HPF   Hyaline Casts, UA PRESENT   Pro Brain natriuretic peptide     Status: Abnormal   Collection Time: 10/27/23  2:34 PM  Result Value Ref Range   Pro Brain Natriuretic Peptide 6,917.0 (H) <300.0 pg/mL     Imaging Orders         DG Chest Portable 1 View         CT ABDOMEN PELVIS W CONTRAST      Assessment and Plan   Natalie Baker is an 68 y.o. female with possible appendicitis on CT.  I recommend repeating a CT of the abdomen and  pelvis with IV and po contrast to re-evaluate the appendix, now it has been 4 days since the outpatient scan.  She should be started on antibiotics.  She took Eliquis  this morning.  Please hold the Eliquis  so she could have surgery on Wednesday if needed.  Surgery team will follow.  Appreciate medical team's management of possible CHF exacerbation.    ICD-10-CM   1. Acute appendicitis with localized peritonitis, without perforation, abscess, or gangrene  K35.30     2. AKI (acute kidney injury)  N17.9     3. Acute hypoxic respiratory failure (HCC)  J96.01     4. Acute on  chronic combined systolic and diastolic congestive heart failure (HCC)  I50.43        Deward JINNY Foy, MD  [facility] Use [email or web address] to contact on call provider  New Patient Billing: [phone] - High MDM

## 2023-10-27 NOTE — ED Notes (Signed)
 Pt asked when she would be going to another room. Pt informed once a room is ready for her she will be going. Pt asked if she has to stay in ED all night, pt was informed that does happen sometime. Pt asked what if I just go home instead of staying all night, they don't even know when they are going to do the surgery. Pt informed that would be her choice.

## 2023-10-27 NOTE — ED Provider Notes (Signed)
 [place] [facility] AT [facility] Provider Note   CSN: [id number] Arrival date & time: 10/27/23  1408     History {Add pertinent medical, surgical, social history, OB history to HPI:1} Chief Complaint  Patient presents with   Abdominal Pain    Natalie Baker is a 68 y.o. female with PMH as listed below who presents with RLQ pain x 1 week. Pt reports having a ct and it showed appendicitis. Patient has had right lower quadrant abdominal pain for 2 weeks associated with nausea and vomiting.  Vomiting has subsided and now only has nausea.  Used to have fevers and chills but has not had any fevers today.  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  Is on Eliquis  for history of DVT, last dose today at 1000 AM.  Outpatient CT scan done on 16 October notes appendicitis and was sent to the ED.  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  CT C/A/P on 10/23/23: 1. No pathologically enlarged lymph nodes in the chest, abdomen or pelvis. 2. Dilated fluid-filled appendix with mural hyperenhancement and wall thickening with adjacent stranding measures up to 7 mm in diameter, concerning for acute appendicitis. 3. Interlobular septal thickening with interposed ground-glass throughout both lungs, may reflect pulmonary edema or an infectious/inflammatory etiology. 4. Previously seen patchy nodular pulmonary consolidations are largely resolved on today's examination. 5. Scattered tiny pulmonary nodules measuring up to 2 mm, nonspecific but favored infectious or inflammatory.   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 CHOP and others   Obesity    BMI 34   Osteoporosis    most recent DEXA 02/2022   Pre-diabetes    Secondary nonischemic congestive cardiomyopathy (HCC)    Adriamycin  2020   Ventricular tachycardia (HCC)    Vitamin D  deficiency        Home Medications Prior to Admission medications   Medication Sig Start Date End Date Taking? Authorizing Provider  amiodarone  (PACERONE ) 200 MG tablet TAKE 1/2 TABLET BY MOUTH DAILY 08/06/23   Bensimhon, Toribio SAUNDERS, MD  Cholecalciferol  (VITAMIN D -3) 25 MCG (1000 UT) CAPS Take 1 capsule by mouth daily.    [provider]  ELIQUIS  5 MG TABS tablet TAKE 1 TABLET BY MOUTH TWICE A DAY 10/13/23   Bensimhon, Toribio SAUNDERS, MD  estradiol  (VIVELLE -DOT) 0.05 MG/24HR patch Place 1 patch onto the skin 2 (two) times a week. Tuesdays & Fridays 08/08/20   [provider]  FARXIGA  10 MG TABS tablet TAKE 1 TABLET BY MOUTH EVERY DAY 06/03/23   Bensimhon, Toribio SAUNDERS, MD  ivabradine  (CORLANOR ) 7.5 MG TABS tablet Take 1 tablet (7.5 mg total) by mouth 2 (two) times daily with a meal. 06/04/23   Bensimhon, Toribio SAUNDERS, MD  levothyroxine  (SYNTHROID ) 100 MCG tablet TAKE 1 TABLET BY MOUTH DAILY ON AN EMPTY STOMACH WITH ONLY WATER FOR 30 MINUTES & NO ANTACID/CALCIUM /MAGNESIUM  FOR 4 HOURS & AVOID BIOTIN  10/03/23   McGowen, Aleene DEL, MD  losartan  (COZAAR ) 25 MG tablet Take 1 tablet (25 mg total) by mouth daily. 09/18/23   Bensimhon, Toribio SAUNDERS, MD  ondansetron  (ZOFRAN -ODT) 4 MG disintegrating tablet Take 1 tablet (4 mg total) by mouth every 8 (eight) hours as needed for nausea or vomiting. 03/27/23   Sherrod Sherrod, MD  potassium chloride  (KLOR-CON ) 10 MEQ tablet 2 tabs po qd 07/07/23   McGowen, Aleene DEL, MD  pravastatin  (PRAVACHOL ) 20 MG tablet TAKE 1 TABLET BY MOUTH EVERY DAY 09/02/23   McGowen,  Aleene DEL, MD  prochlorperazine  (COMPAZINE ) 10 MG tablet TAKE 1 TABLET BY MOUTH EVERY 6 HOURS AS NEEDED FOR NAUSEA OR VOMITING. 04/13/21   Sherrod Sherrod, MD  spironolactone  (ALDACTONE ) 25 MG tablet TAKE 1 TABLET BY MOUTH EVERY DAY 08/06/23   Bensimhon, Toribio SAUNDERS, MD  temazepam  (RESTORIL ) 30 MG capsule Take 1 capsule (30 mg total) by mouth at bedtime as needed for sleep. 10/20/23   Sherrod Sherrod, MD  torsemide  (DEMADEX ) 20 MG tablet Take 1 tablet (20 mg total) by mouth daily. 07/07/23   McGowen, Aleene DEL, MD      Allergies    Adriamycin  [doxorubicin ] and Ppd [tuberculin purified protein derivative]    Review of Systems   Review of Systems A 10 point review of systems was performed and is negative unless otherwise reported in HPI.  Physical Exam Updated Vital Signs BP 98/72   Pulse 72   Temp 97.7 F (36.5 C) (Oral)   Resp 20   SpO2 95%  Physical Exam General: Normal appearing female, lying in bed.  HEENT: PERRLA, Sclera anicteric, MMM, trachea midline.  Cardiology: RRR, no murmurs/rubs/gallops.  Right upper chest port without any surrounding erythema induration or tenderness to palpation. Resp: Normal respiratory rate and effort. CTAB, no wheezes, rhonchi, crackles.  Abd: Soft, diffuse abdominal tenderness, most tender in the right lower quadrant and right adnexal region with localized peritonitis on exam, non-distended GU: Deferred. MSK: No peripheral edema or signs of trauma. Extremities without deformity or TTP. No cyanosis or clubbing. Skin: warm, dry.  Neuro: A&Ox4, CNs II-XII grossly intact. MAEs. Sensation grossly intact.  Psych: Normal mood and affect.   ED Results / Procedures / Treatments   Labs (all labs ordered are listed, but only abnormal results are displayed) Labs Reviewed  COMPREHENSIVE METABOLIC PANEL WITH GFR - Abnormal; Notable for the following components:      Result Value   Chloride 96 (*)    Creatinine, Ser 1.73 (*)    Total Protein 6.4 (*)    Alkaline  Phosphatase 154 (*)    GFR, Estimated 32 (*)    All other components within normal limits  CBC WITH DIFFERENTIAL/PLATELET - Abnormal; Notable for the following components:   RBC 6.01 (*)    HCT 46.8 (*)    MCV 77.9 (*)    MCH 22.8 (*)    MCHC 29.3 (*)    RDW 20.3 (*)    Platelets 492 (*)    Lymphs Abs 0.4 (*)    Abs Immature Granulocytes 0.12 (*)    All other components within normal limits  URINALYSIS, ROUTINE W REFLEX MICROSCOPIC - Abnormal; Notable for the following components:   APPearance HAZY (*)    Glucose, UA >=500 (*)    Protein, ur 30 (*)    Bacteria, UA MANY (*)    All other components within normal limits  PRO BRAIN NATRIURETIC PEPTIDE - Abnormal; Notable for the following components:  Pro Brain Natriuretic Peptide 6,917.0 (*)    All other components within normal limits  LIPASE, BLOOD    EKG None  Radiology No results found.  Procedures Procedures  {Document cardiac monitor, telemetry assessment procedure when appropriate:1}  Medications Ordered in ED Medications  metroNIDAZOLE (FLAGYL) IVPB 500 mg (500 mg Intravenous New Bag/Given 10/27/23 1532)  morphine  (PF) 4 MG/ML injection 4 mg (has no administration in time range)  ondansetron  (ZOFRAN ) injection 4 mg (has no administration in time range)  lactated ringers  bolus 1,000 mL (has no administration in time range)  cefTRIAXone  (ROCEPHIN ) 1 g in sodium chloride  0.9 % 100 mL IVPB (0 g Intravenous Stopped 10/27/23 1601)    ED Course/ Medical Decision Making/ A&P                          Medical Decision Making Amount and/or Complexity of Data Reviewed Labs: ordered. Decision-making details documented in ED Course. Radiology: ordered.  Risk Prescription drug management. Decision regarding hospitalization.    This patient presents to the ED for concern of acute appendicitis on her CT scan, this involves an extensive number of treatment options, and is a complaint that carries with it a high risk of  complications and morbidity.  I considered the following differential and admission for this acute, potentially life threatening condition.  Patient is overall very well-appearing and hemodynamically stable.  MDM:    Patient with CT demonstrating acute appendicitis and is symptomatic from this.  Reassuringly afebrile without any tachycardia or tachypnea.  No leukocytosis noted on her CBC.  No signs of sepsis.  Will consult with general surgery.  She is given IV ceftriaxone  and metronidazole, as well as Zofran  and morphine .  Additionally patient notes some dyspnea on exertion and shortness of breath with cough.  Her CT scan on the 16th does show some GGO's possibly edema.  Repeat chest x-ray today***.  Clinical Course as of 10/27/23 1618  Mon Oct 27, 2023  1504 WBC: 4.3 No leukocytosis  [HN]  1545 Creatinine(!): 1.73 +AKI, will hold off on fluids now d/t c/f possible HF, but likely prerenal [HN]  1615 Pro Brain Natriuretic Peptide(!): 6,917.0 Elevated pro-BNP.  [HN]  1615 O2 Device: Nasal Cannula [HN]  1616 O2 Flow Rate (L/min): 2 L/min [HN]  1616 Michael PA with general surgery recommends repeat CT scan with PO contrast, as things can change in 4 days w/ appendicitis. Recommends medical admission and hold her eliquis . [HN]  1617 Pt now requiring 2L [place] for mild hypoxia, calling CXR to return as they had come while the patient was sterile for port access [HN]    Clinical Course User Index [HN] Franklyn Sid SAILOR, MD    Labs: I Ordered, and personally interpreted labs.  The pertinent results include: Those listed above  Imaging Studies ordered: I ordered imaging studies including chest x-ray I independently visualized and interpreted imaging. I agree with the radiologist interpretation  Additional history obtained from chart review, husband at bedside.  Cardiac Monitoring: The patient was maintained on a cardiac monitor.  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 CHOP and others   Obesity    BMI 34   Osteoporosis    most recent DEXA 02/2022   Pre-diabetes    Secondary nonischemic congestive cardiomyopathy (HCC)    Adriamycin  2020   Ventricular tachycardia (HCC)    Vitamin D  deficiency      Medicines Meds ordered this encounter  Medications   metroNIDAZOLE (FLAGYL) IVPB 500 mg    Antibiotic Indication::   Intra-abdominal Infection   cefTRIAXone  (ROCEPHIN ) 1 g in sodium chloride  0.9 % 100 mL IVPB    Antibiotic Indication::   Intra-abdominal   DISCONTD: ondansetron  (ZOFRAN ) injection 4 mg   DISCONTD: morphine  (PF) 4 MG/ML injection 4 mg   DISCONTD: lactated ringers  bolus 1,000 mL   morphine  (PF) 4 MG/ML injection 4 mg   ondansetron  (ZOFRAN ) injection 4 mg   lactated ringers  bolus 1,000 mL    I have reviewed the patients home medicines and have made adjustments as needed  Problem List / ED Course: Problem List Items Addressed This Visit   None Visit Diagnoses       Acute appendicitis with localized peritonitis, without perforation, abscess, or gangrene    -  Primary            {Document critical care time when appropriate:1} {Document review of labs and clinical decision tools ie heart score, Chads2Vasc2 etc:1}  {Document your independent review of  radiology images, and any outside records:1} {Document your discussion with family members, caretakers, and with consultants:1} {Document social determinants of health affecting pt's care:1} {Document your decision making why or why not admission, treatments were needed:1}  This note was created using dictation software, which may contain spelling or grammatical errors.

## 2023-10-28 DIAGNOSIS — Z01810 Encounter for preprocedural cardiovascular examination: Secondary | ICD-10-CM

## 2023-10-28 DIAGNOSIS — E039 Hypothyroidism, unspecified: Secondary | ICD-10-CM | POA: Diagnosis not present

## 2023-10-28 DIAGNOSIS — I700 Atherosclerosis of aorta: Secondary | ICD-10-CM

## 2023-10-28 DIAGNOSIS — K3580 Unspecified acute appendicitis: Secondary | ICD-10-CM | POA: Diagnosis not present

## 2023-10-28 DIAGNOSIS — I5043 Acute on chronic combined systolic (congestive) and diastolic (congestive) heart failure: Secondary | ICD-10-CM

## 2023-10-28 LAB — PRO BRAIN NATRIURETIC PEPTIDE: Pro Brain Natriuretic Peptide: 4824 pg/mL — ABNORMAL HIGH (ref <300.0)

## 2023-10-28 LAB — SURGICAL PCR SCREEN
MRSA, PCR: NEGATIVE
Staphylococcus aureus: NEGATIVE

## 2023-10-28 LAB — CBC
HCT: 40.2 % (ref 36.0–46.0)
Hemoglobin: 12.2 g/dL (ref 12.0–15.0)
MCH: 23.7 pg — ABNORMAL LOW (ref 26.0–34.0)
MCHC: 30.3 g/dL (ref 30.0–36.0)
MCV: 78.1 fL — ABNORMAL LOW (ref 80.0–100.0)
Platelets: 425 K/uL — ABNORMAL HIGH (ref 150–400)
RBC: 5.15 MIL/uL — ABNORMAL HIGH (ref 3.87–5.11)
RDW: 19.9 % — ABNORMAL HIGH (ref 11.5–15.5)
WBC: 4.2 K/uL (ref 4.0–10.5)
nRBC: 0 % (ref 0.0–0.2)

## 2023-10-28 LAB — MAGNESIUM: Magnesium: 2.3 mg/dL (ref 1.7–2.4)

## 2023-10-28 LAB — BASIC METABOLIC PANEL WITH GFR
Anion gap: 12 (ref 5–15)
BUN: 16 mg/dL (ref 8–23)
CO2: 28 mmol/L (ref 22–32)
Calcium: 9 mg/dL (ref 8.9–10.3)
Chloride: 96 mmol/L — ABNORMAL LOW (ref 98–111)
Creatinine, Ser: 1.26 mg/dL — ABNORMAL HIGH (ref 0.44–1.00)
GFR, Estimated: 47 mL/min — ABNORMAL LOW (ref >60)
Glucose, Bld: 92 mg/dL (ref 70–99)
Potassium: 3.9 mmol/L (ref 3.5–5.1)
Sodium: 136 mmol/L (ref 135–145)

## 2023-10-28 LAB — HIV ANTIBODY (ROUTINE TESTING W REFLEX): HIV Screen 4th Generation wRfx: NONREACTIVE

## 2023-10-28 MED ORDER — SODIUM CHLORIDE 0.9 % IV SOLN: 2.0000 g | INTRAVENOUS | Status: DC

## 2023-10-28 MED ORDER — IVABRADINE HCL 5 MG PO TABS
5.0000 mg | ORAL_TABLET | Freq: Two times a day (BID) | ORAL | Status: DC
  Administered 2023-10-28 – 2023-10-30 (×3): 5 mg via ORAL
  Filled 2023-10-28 (×5): qty 1

## 2023-10-28 MED ORDER — TORSEMIDE 20 MG PO TABS
20.0000 mg | ORAL_TABLET | Freq: Every day | ORAL | Status: DC
  Administered 2023-10-28 – 2023-10-30 (×2): 20 mg via ORAL
  Filled 2023-10-28 (×2): qty 1

## 2023-10-28 MED ORDER — AMIODARONE HCL 200 MG PO TABS
100.0000 mg | ORAL_TABLET | Freq: Every day | ORAL | Status: DC
  Administered 2023-10-30: 100 mg via ORAL
  Filled 2023-10-28 (×2): qty 1

## 2023-10-28 MED ORDER — POTASSIUM CHLORIDE CRYS ER 20 MEQ PO TBCR
20.0000 meq | EXTENDED_RELEASE_TABLET | Freq: Every day | ORAL | Status: DC
  Administered 2023-10-28 – 2023-10-30 (×2): 20 meq via ORAL
  Filled 2023-10-28 (×2): qty 1

## 2023-10-28 MED ORDER — IVABRADINE HCL 7.5 MG PO TABS: 7.5000 mg | ORAL_TABLET | Freq: Two times a day (BID) | ORAL | Status: DC

## 2023-10-28 MED ORDER — FUROSEMIDE 10 MG/ML IJ SOLN: 20.0000 mg | Freq: Every day | INTRAMUSCULAR | Status: DC

## 2023-10-28 MED ORDER — IVABRADINE 2.5 MG HALF TABLET
2.5000 mg | ORAL_TABLET | Freq: Two times a day (BID) | ORAL | Status: DC
  Administered 2023-10-28 – 2023-10-30 (×3): 2.5 mg via ORAL
  Filled 2023-10-28 (×5): qty 1

## 2023-10-28 MED ORDER — ORAL CARE MOUTH RINSE: 15.0000 mL | OROMUCOSAL | Status: DC | PRN

## 2023-10-28 MED ORDER — METRONIDAZOLE 500 MG/100ML IV SOLN
500.0000 mg | Freq: Two times a day (BID) | INTRAVENOUS | Status: DC
  Administered 2023-10-28 – 2023-10-30 (×5): 500 mg via INTRAVENOUS
  Filled 2023-10-28 (×5): qty 100

## 2023-10-28 MED ORDER — SODIUM CHLORIDE 0.9% FLUSH
10.0000 mL | Freq: Two times a day (BID) | INTRAVENOUS | Status: DC
  Administered 2023-10-28 – 2023-10-30 (×3): 10 mL

## 2023-10-28 MED ORDER — LEVOTHYROXINE SODIUM 100 MCG PO TABS
100.0000 ug | ORAL_TABLET | Freq: Every day | ORAL | Status: DC
  Administered 2023-10-28 – 2023-10-30 (×3): 100 ug via ORAL
  Filled 2023-10-28 (×3): qty 1

## 2023-10-28 MED ORDER — CIPROFLOXACIN IN D5W 400 MG/200ML IV SOLN
400.0000 mg | Freq: Two times a day (BID) | INTRAVENOUS | Status: DC
  Administered 2023-10-28 – 2023-10-30 (×5): 400 mg via INTRAVENOUS
  Filled 2023-10-28 (×5): qty 200

## 2023-10-28 MED ORDER — CHLORHEXIDINE GLUCONATE CLOTH 2 % EX PADS
6.0000 | MEDICATED_PAD | Freq: Every day | CUTANEOUS | Status: DC
  Administered 2023-10-28 – 2023-10-30 (×2): 6 via TOPICAL

## 2023-10-28 MED ORDER — SODIUM CHLORIDE 0.9% FLUSH: 10.0000 mL | INTRAVENOUS | Status: DC | PRN

## 2023-10-28 NOTE — ED Notes (Signed)
 Per Dr. Carlota hold all blood pressure medications.

## 2023-10-28 NOTE — Consult Note (Addendum)
 Cardiology Consultation   Patient ID: [name] MRN: [id number]; DOB: May 14, 1955  Admit date: 10/27/2023 Date of Consult: 10/28/2023  PCP:  [name], MD   [facility] Providers Cardiologist:  None        Patient Profile: Natalie Baker is a 68 y.o. 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  History of Present Illness: Ms. Natalie Baker was admitted in 2021 with new systolic HF (LVEF 20-25%) with RV dysfunction 2/2 chemotherapy for her hodgkins lymphoma. The Advanced heart failure team took over care during that admission, and she has continued to follow with them outpatient. She was last seen 09/18/23 by Dr. Delle. At that time she was doing fairly well, able to complete ADLs and mostly asymptomatic. MYHA II/III. Her most recent echocardiogram showed LVEF 35-40% with G2DD, normal RV function, and moderate MR. GDMT includes: farxgia 10 mg, ivabradine  7.5 mg BID, cozaar  25 mg, spironolactone  25 mg, and torsemide  20 mg daily. She has had transient hypotension requiring midodrine  and one admission for low output requiring milrinone  use previously. She has had no recent HF hospitalizations.    She is on chronic anticoagulation for hx of partial Left brachial DVT in 2023 that was provoked. She is also on amiodarone  for h/o VT with PVC ectopy. She did wear a LifeVest, though this was discontinued with EF improvement and reduced arrhthymia/ectopy burden on amiodarone .   Patient presented to the ED on 10/20 at the request of her Oncologist.  Patient had presented to her oncologist for a normal scheduled appointment, she had noted that she was having some abdominal discomfort.   They were already obtaining a CT chest/A/P for their normal surveillance.  This showed evidence of appendicitis, bilateral ground glass opacities [ edema vs infection/inflammation] with possible infectious/inflammatory tiny nodules.   BP: 119/59   HR 82   initially 95% on RA, though now on 3L of [place] ECG Sinus rhythm with LAD, low voltage. Abnormal R wave progression and inferior q waves. VR 72. [Similar to previous] CXR cardiomegaly, though no evidence of volume overload Repeat CT A/P showed continued evidence of appendicitis, though no CT Chest imaging to compare respiratory system findings from CT 10/16 that prompted this admission Pertinent lab work:  Cr 1.73 [ Baseline labile but ~1.2-1.3] Pro BNP 6917 -> 4824  Last dose of eliquis  10/20 am. Surgery was consulted, they have initiated patient on IV antibiotics with potential plans to pursue surgery tomorrow. Eliquis  on hold. She has been given IV lasix  20 mg.  She reported similar urine output to her oral torsemide  dose.  Inaccurate I/Os- will place order.   On interview, she had around last Thursday she had a fever, nausea/vomiting/epigastric pain that later radiated and migrated to her right lower quadrant.  She is now afebrile without emesis though is still having mild nausea and abdominal discomfort.  She reports though that she has been able to eat and drink okay just eating smaller meals.  Denied URI symptoms. Her dyspnea on exertion has been unchanged for the past month.  Denied shortness of breath at rest, orthopnea, PND, and peripheral edema.  She is able to complete ADLs at home.  She walks up and down her neighborhood road without  chest pain.  Her weight has remained stable around 172 pounds. Denied oxygen use at home. Blood pressure is typically on the lower side, she used to be on midodrine  though this has not been discontinued.  Her systolic number is typically~90.  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 CHOP and others   Obesity    BMI 34   Osteoporosis    most recent DEXA 02/2022   Pre-diabetes    Secondary nonischemic congestive cardiomyopathy (HCC)    Adriamycin  2020   Ventricular tachycardia (HCC)    Vitamin D  deficiency     Past Surgical History:  Procedure Laterality Date   ABDOMINAL HYSTERECTOMY  1997   CESAREAN SECTION  1981   CHOLECYSTECTOMY N/A 09/25/2020   Procedure: LAPAROSCOPIC CHOLECYSTECTOMY;  Surgeon: Stevie Herlene Righter, MD;  Location: [facility];  Service: General;  Laterality: N/A;  60 MINUTES   COLONOSCOPY     03/2018 NO POLYPS   COLONOSCOPY W/ BIOPSIES  12/29/2006   IR IMAGING GUIDED PORT INSERTION  01/15/2019   LAPAROSCOPY     1991 for emdometriosis   RIGHT/LEFT HEART CATH AND CORONARY ANGIOGRAPHY N/A 06/25/2019   Procedure: RIGHT/LEFT HEART CATH AND CORONARY ANGIOGRAPHY;  Surgeon: Cherrie Toribio SAUNDERS, MD;  Location: [facility]ASIVE [facility];  Service: Cardiovascular;  Laterality: N/A;   TEE WITHOUT CARDIOVERSION N/A 07/04/2021   Procedure: TRANSESOPHAGEAL ECHOCARDIOGRAM (TEE);  Surgeon: Cherrie Toribio SAUNDERS, MD;  Location: [facility] ENDOSCOPY;  Service: Cardiovascular;  Laterality: N/A;   TRANSTHORACIC ECHOCARDIOGRAM     05/17/22 ejection fraction 35% (unchanged from 2023), global hypokinesis, mild RV dysfunction, mild to moderate MR.   UPPER GASTROINTESTINAL ENDOSCOPY         Scheduled Meds:  amiodarone   100 mg Oral Daily   furosemide   20 mg Intravenous Daily   ivabradine   7.5 mg Oral BID WC   levothyroxine   100 mcg Oral Q0600   Continuous Infusions:  ciprofloxacin     metronidazole     PRN Meds: acetaminophen  **OR** acetaminophen ,  bisacodyl, melatonin, morphine  injection, ondansetron  (ZOFRAN ) IV  Allergies:    Allergies  Allergen Reactions   Adriamycin  [Doxorubicin ] Other (See Comments)    Acute heart failure (chemo from Jan - May 2021)   Ppd [Tuberculin Purified Protein Derivative] Swelling    Positive reaction.  Negative Chest Xray 06/16/12    Social History:   Social History   Socioeconomic History   Marital status: Married    Spouse name: Not on file   Number of children: 1   Years of education: Not on file   Highest education level: Not on file  Occupational History   Occupation: Marine scientist  Tobacco Use   Smoking status: Former    Current packs/day: 0.00    Types: Cigarettes    Start date: 01/07/1981    Quit date: 01/08/1996    Years since quitting: 27.8   Smokeless tobacco: Never   Tobacco comments:    quit 1998 but is exposed to 2nd hand smoke  Vaping Use   Vaping status: Never Used  Substance and Sexual Activity   Alcohol use: No  Drug use: No   Sexual activity: Yes    Partners: Male    Birth control/protection: Post-menopausal  Other Topics Concern   Not on file  Social History Narrative   Married, one daughter,    Educ: HS   Occup:  employed as an Conservator, museum/gallery.   No tob or alc   Social Drivers of Corporate investment banker Strain: Low Risk  (03/22/2021)   Overall Financial Resource Strain (CARDIA)    Difficulty of Paying Living Expenses: Not very hard  Food Insecurity: No Food Insecurity (03/22/2021)   Hunger Vital Sign    Worried About Running Out of Food in the Last Year: Never true    Ran Out of Food in the Last Year: Never true  Transportation Needs: No Transportation Needs (03/22/2021)   PRAPARE - Administrator, Civil Service (Medical): No    Lack of Transportation (Non-Medical): No  Physical Activity: Inactive (08/13/2018)   Exercise Vital Sign    Days of Exercise per Week: 0 days    Minutes of Exercise per Session: 0 min  Stress: No Stress Concern  Present (08/13/2018)   [employer] of Occupational Health - Occupational Stress Questionnaire    Feeling of Stress : Only a little  Social Connections: Moderately Integrated (07/02/2019)   Social Connection and Isolation Panel    Frequency of Communication with Friends and Family: More than three times a week    Frequency of Social Gatherings with Friends and Family: Once a week    Attends Religious Services: More than 4 times per year    Active Member of [employer] or Organizations: No    Attends Banker Meetings: Never    Marital Status: Married  Catering manager Violence: Not on file    Family History:   Family History  Problem Relation Age of Onset   Heart disease Mother 19       chf   Diabetes Mother    Thyroid  disease Mother    Cancer Mother 38       female/ skin   Hypertension Mother    Liver disease Mother    Hypertension Father    Alcohol abuse Father    Heart disease Father    Breast cancer Sister 35   Diabetes Sister    Hypothyroidism Sister    Hashimoto's thyroiditis Sister    Hyperlipidemia Brother    Hypertension Brother        x 2   Diabetes Brother    Hypothyroidism Brother    Prostate cancer Brother 24   Liver disease Maternal Grandmother    Diabetes Paternal Grandmother    Breast cancer Maternal Aunt    Hyperlipidemia Maternal Aunt    Heart disease Maternal Aunt    Bone cancer Maternal Aunt        metastatic   Breast cancer Niece 54   Colon cancer Neg Hx    Colon polyps Neg Hx    Esophageal cancer Neg Hx    Rectal cancer Neg Hx    Stomach cancer Neg Hx      ROS:  Please see the history of present illness.  All other ROS reviewed and negative.     Physical Exam/Data: Vitals:   10/28/23 0700 10/28/23 0715 10/28/23 0716 10/28/23 0730  BP: (!) 96/59     Pulse: 67 72  73  Resp: 18     Temp:      TempSrc:      SpO2: 97% 95% 97%  95%    Intake/Output Summary (Last 24 hours) at 10/28/2023 0743 Last data filed at 10/27/2023  1632 Gross per 24 hour  Intake 191.32 ml  Output --  Net 191.32 ml      09/18/2023    2:46 PM 07/07/2023    9:51 AM 06/13/2023    3:41 PM  Last 3 Weights  Weight (lbs) 170 lb 9.6 oz 179 lb 12.8 oz 179 lb 7 oz  Weight (kg) 77.384 kg 81.557 kg 81.392 kg     There is no height or weight on file to calculate BMI.  General:  Well nourished, well developed, in no acute distress wearing [place] HEENT: normal Neck: no JVD Vascular: No carotid bruits; Distal pulses 1+ bilaterally Cardiac:  normal S1, S2; RRR; no murmur  Lungs:  clear to auscultation bilaterally, no wheezing, rhonchi or rales  Abd: soft, tender to epigastric, RUQ, and RLQ, no hepatomegaly  Ext: no edema Musculoskeletal:  No deformities, BUE and BLE strength normal and equal Skin: warm and dry  Neuro:  CNs 2-12 intact, no focal abnormalities noted Psych:  Normal affect   EKG:  The EKG was personally reviewed and demonstrates:  See HPI Telemetry:  Telemetry was personally reviewed and demonstrates: The patient was removed from telemetry overnight.  Though data seen she is in sinus rhythm with PVC.  HR~70 with fluctuations to 100  Relevant CV Studies: Echocardiogram 07/03/23 IMPRESSIONS     1. Left ventricular ejection fraction, by estimation, is 35 to 40%. The  left ventricle has moderately decreased function. The left ventricle  demonstrates global hypokinesis. Left ventricular diastolic parameters are  consistent with Grade II diastolic  dysfunction (pseudonormalization). The average left ventricular global  longitudinal strain is -10.1 %. The global longitudinal strain is  abnormal.   2. Right ventricular systolic function is normal. The right ventricular  size is normal. There is normal pulmonary artery systolic pressure.   3. Left atrial size was mildly dilated.   4. There is no evidence of cardiac tamponade.   5. The mitral valve is normal in structure. Moderate mitral valve  regurgitation. No evidence of mitral  stenosis.   6. The aortic valve is tricuspid. There is mild calcification of the  aortic valve. Aortic valve regurgitation is not visualized. Aortic valve  sclerosis/calcification is present, without any evidence of aortic  stenosis.   7. The inferior vena cava is normal in size with greater than 50%  respiratory variability, suggesting right atrial pressure of 3 mmHg.   Laboratory Data: Chemistry Recent Labs  Lab 10/23/23 1423 10/27/23 1434  NA 135 136  K 3.4* 3.8  CL 93* 96*  CO2 33* 28  GLUCOSE 83 87  BUN 17 16  CREATININE 1.42* 1.73*  CALCIUM  9.3 9.6  GFRNONAA 41* 32*  ANIONGAP 9 12    Recent Labs  Lab 10/23/23 1423 10/27/23 1434  PROT 6.1* 6.4*  ALBUMIN 3.5 3.7  AST 13* 21  ALT 9 8  ALKPHOS 129* 154*  BILITOT 0.6 0.5   Hematology Recent Labs  Lab 10/23/23 1423 10/27/23 1434 10/28/23 0553  WBC 5.4 4.3 4.2  RBC 5.30* 6.01* 5.15*  HGB 12.7 13.7 12.2  HCT 39.9 46.8* 40.2  MCV 75.3* 77.9* 78.1*  MCH 24.0* 22.8* 23.7*  MCHC 31.8 29.3* 30.3  RDW 19.4* 20.3* 19.9*  PLT 491* 492* 425*   BNP Recent Labs  Lab 10/27/23 1434  PROBNP 6,917.0*     Radiology/Studies:  CT ABDOMEN PELVIS W CONTRAST Result Date:  10/27/2023 EXAM: CT ABDOMEN AND PELVIS WITH CONTRAST 10/27/2023 08:09:10 PM TECHNIQUE: CT of the abdomen and pelvis was performed with the administration of intravenous contrast. Multiplanar reformatted images are provided for review. Automated exposure control, iterative reconstruction, and/or weight-based adjustment of the mA/kV was utilized to reduce the radiation dose to as low as reasonably achievable. 80 mL of iohexol  (OMNIPAQUE ) 300 MG/ML solution was administered intravenously. COMPARISON: 10/23/2023 CLINICAL HISTORY: with PO contrast, recent diagnosis appendicitis. Per chart: Pt reports with RLQ pain x 1 week. Pt reports having a ct and it showed appendicitis. FINDINGS: LOWER CHEST: No acute abnormality. LIVER: Hepatic steatosis. GALLBLADDER AND BILE  DUCTS: Gallbladder is unremarkable. No biliary ductal dilatation. SPLEEN: No acute abnormality. PANCREAS: No acute abnormality. ADRENAL GLANDS: No acute abnormality. KIDNEYS, URETERS AND BLADDER: No stones in the kidneys or ureters. No hydronephrosis. No perinephric or periureteral stranding. Urinary bladder is unremarkable. GI AND BOWEL: Stomach demonstrates no acute abnormality. Unchanged in appearance of the appendix which is mildly dilated measuring up to 9 mm with mural thickening and hyper-enhancement. The degree of adjacent stranding and fluid appears decreased compared to 10/23/2023. There is no bowel obstruction. PERITONEUM AND RETROPERITONEUM: No ascites. No free air. VASCULATURE: Aorta is normal in caliber. Aortic atherosclerotic calcification. LYMPH NODES: No lymphadenopathy. REPRODUCTIVE ORGANS: No acute abnormality. BONES AND SOFT TISSUES: No acute osseous abnormality. No focal soft tissue abnormality. IMPRESSION: 1. Dilated and inflamed appendix similar to 10 / 16 / 25. The degree of adjacent inflammatory stranding has decreased. Electronically signed by: Norman Gatlin MD 10/27/2023 08:21 PM EDT RP Workstation: [id number]   DG Chest Portable 1 View Result Date: 10/27/2023 CLINICAL DATA:  Follow-up ground-glass opacities on recent CT EXAM: PORTABLE CHEST 1 VIEW COMPARISON:  05/21/2023 FINDINGS: Cardiomegaly. Right chest port catheter. Both lungs are clear. The visualized skeletal structures are unremarkable. IMPRESSION: Cardiomegaly without acute abnormality of the lungs on frontal chest radiograph. No focal airspace opacity. Electronically Signed   By: Marolyn JONETTA Jaksch M.D.   On: 10/27/2023 16:39     Assessment and Plan: Preoperative Evaluation Patient is able to complete all ADLs at home.  She is able to walk up and down her driveway/neighborhood road without difficulty. Denied anginal symptoms.  Reports her HF symptoms have remained stable over the last month.  Has maintained a stable weight  around ~172lb.  No weight was charted on admission, will obtain weight today.  On exam she appears euvolemic. Her Pro-BNP was up on admission, though improved after dose of IV lasix . Unfortunately most of her values have been BNPs so hard to assess what her baseline is in comparison. Additionally, her Cr has also improved.  Her blood pressure is at her baseline which is low-normal [see below.] She is having new hypoxia requiring oxygen supplementation. Patient denied feeling short of breath. At this time I do not think it is due to her HF as her PE was negative for crackles. Interesting she did have respiratory findings on her outpatient CT Chest that appear to have resolved on CT A/P though no dedicated chest imaging, unsure if this is contributing.   At this time do not think that we need to pursue any further cardiac evaluation prior to surgery. Patient may need an additional dose of lasix  prior to surgery [see below].  She is at intermediate risk for a low risk procedure. MD final recommendations to follow.  Acute on chronic systolic and diastolic heart failure-NICM Moderate MR Previous CT Chest showed volume overload vs infection, repeat this  admission [though not dedicated to chest ] appears to have resolved. CXR shows no evidence of volume overload Pro BNP 6917 -> 4824 [no prior Pro-BNP to compare] Cr 1.73 -> 1.26 Farxiga  and cozaar  on hold for pending surgery Received IV lasix  20 mg. No UOP charted, though reported UOP similar to her home torsemide  dose. On exam appears euvolemic  Would resume home torsemide  for diuresis Continue ivabradine  7.5mg  BID  Hx of Hypotension BP: 98/51 Patient historically was on midodrine , though now discontinued. Her blood pressure is typically 90-100 systolic. At baseline this admission.   Hx of VT/PVC Telemetry showed some ectopy, though no evidence of NSVT Continue amiodarone  100 mg Continue cardiac telemetry  Hyperlipidemia  Pravastatin  on  hold Resume when okay by surgery  Hx of Left Brachial DVT Continue to hold eliquis  for pending surgery, restart when okay by surgery  Risk Assessment/Risk Scores:       New [place]  Heart Association (NYHA) Functional Class NYHA Class II/III    For questions or updates, please contact [facility] Please consult [email or web address] for contact info under      Signed, Leontine LOISE Salen, PA-C  10/28/2023 7:43 AM

## 2023-10-28 NOTE — ED Notes (Signed)
 MD and Paramedic notified of pt's BP readings

## 2023-10-28 NOTE — Plan of Care (Signed)

## 2023-10-28 NOTE — Anesthesia Preprocedure Evaluation (Addendum)
 Anesthesia Evaluation  Patient identified by MRN, date of birth, ID band Patient awake    Reviewed: Allergy & Precautions, NPO status , Patient's Chart, lab work & pertinent test results  History of Anesthesia Complications Negative for: history of anesthetic complications  Airway Mallampati: III  TM Distance: >3 FB Neck ROM: Full    Dental no notable dental hx. (+) Teeth Intact, Caps, Dental Advisory Given   Pulmonary former smoker, PE   Pulmonary exam normal breath sounds clear to auscultation       Cardiovascular hypertension, (-) angina + DVT (on eliquis )  (-) Past MI Normal cardiovascular exam Rhythm:Regular Rate:Normal  chemotherapy induced heart failure with an EF of 35-40% with moderate MR 06/2023,    Neuro/Psych    GI/Hepatic Hepatic Steatosis   Endo/Other  Hypothyroidism    Renal/GU Renal InsufficiencyRenal diseaseLab Results      Component                Value               Date                      NA                       136                 10/28/2023                CL                       96 (L)              10/28/2023                K                        3.9                 10/28/2023                CO2                      28                  10/28/2023                BUN                      16                  10/28/2023                CREATININE               1.26 (H)            10/28/2023                GFRNONAA                 47 (L)              10/28/2023                CALCIUM                   9.0  10/28/2023                PHOS                     3.8                 06/25/2019                ALBUMIN                  3.7                 10/27/2023                GLUCOSE                  92                  10/28/2023                Musculoskeletal  (+) Arthritis ,    Abdominal   Peds  Hematology mantle cell lymphoma s/p CAR-T therapy, not a candidate for bone marrow  transplant  Lab Results      Component                Value               Date                      WBC                      4.2                 10/28/2023                HGB                      12.2                10/28/2023                HCT                      40.2                10/28/2023                MCV                      78.1 (L)            10/28/2023                PLT                      425 (H)             10/28/2023              Anesthesia Other Findings All: adriamycin   Reproductive/Obstetrics                              Anesthesia Physical Anesthesia Plan  ASA: 4  Anesthesia Plan: General   Post-op Pain Management: Ofirmev  IV (intra-op)* and Precedex   Induction: Intravenous  PONV Risk Score and Plan: 4 or greater and Treatment may vary  due to age or medical condition, Midazolam , Ondansetron  and Dexamethasone   Airway Management Planned: Oral ETT  Additional Equipment: None  Intra-op Plan:   Post-operative Plan: Extubation in OR  Informed Consent: I have reviewed the patients History and Physical, chart, labs and discussed the procedure including the risks, benefits and alternatives for the proposed anesthesia with the patient or authorized representative who has indicated his/her understanding and acceptance.     Dental advisory given  Plan Discussed with: CRNA and Surgeon  Anesthesia Plan Comments:          Anesthesia Quick Evaluation

## 2023-10-28 NOTE — Progress Notes (Signed)
 Subjective: CC: She reports 5-7 days of RLQ abdominal pain that is persistent today. No n/v. Tolerating clears.   She is on 3L o2 here. No cp or sob. Does not wear o2 at home. Currently being treated for a CHF excerebration.   Afebrile. No tachycardia but is on ivabradine . Soft BP's with last BP 93/53. On amio and getting IV Lasix . Was previously on Midodrine . WBC 4.2.   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   She has a hx of DVT. Last dose of Eliquis  was 10/20 AM.   She has a history of C-section, laparoscopic surgery for endometriosis, abdominal hysterectomy and laparoscopic cholecystectomy.   Objective: Vital signs in last 24 hours: Temp:  [97.7 F (36.5 C)-99.1 F (37.3 C)] 98.1 F (36.7 C) (10/21 1032) Pulse Rate:  [67-82] 72 (10/21 1015) Resp:  [14-57] 22 (10/21 1015) BP: (88-119)/(36-83) 93/53 (10/21 1000) SpO2:  [83 %-97 %] 93 % (10/21 1015) Weight:  [80.1 kg] 80.1 kg (10/21 0941)    Intake/Output from previous day: 10/20 0701 - 10/21 0700 In: 191.3 [IV Piggyback:191.3] Out: -  Intake/Output this shift: No intake/output data recorded.  PE: Gen:  Alert, NAD, pleasant Card:  RRR Pulm:  Rate and effort normal on o2 Abd: Soft, no distension, RLQ ttp without rigidity or guarding.  Psych: A&Ox3   Lab Results:  Recent Labs    10/27/23 1434 10/28/23 0553  WBC 4.3 4.2  HGB 13.7 12.2  HCT 46.8* 40.2  PLT 492* 425*   BMET Recent Labs    10/27/23 1434 10/28/23 0553  NA 136 136  K 3.8 3.9  CL 96* 96*  CO2 28 28  GLUCOSE 87 92  BUN 16 16  CREATININE 1.73* 1.26*  CALCIUM  9.6 9.0   PT/INR No results for input(s): LABPROT, INR in the last 72 hours. CMP     Component Value Date/Time   NA 136 10/28/2023 0553   K 3.9 10/28/2023 0553   CL 96 (L) 10/28/2023 0553   CO2 28 10/28/2023 0553   GLUCOSE 92 10/28/2023 0553   BUN 16 10/28/2023 0553   CREATININE 1.26 (H) 10/28/2023 0553   CREATININE 1.42  (H) 10/23/2023 1423   CREATININE 1.06 (H) 12/13/2022 0956   CALCIUM  9.0 10/28/2023 0553   PROT 6.4 (L) 10/27/2023 1434   ALBUMIN 3.7 10/27/2023 1434   AST 21 10/27/2023 1434   AST 13 (L) 10/23/2023 1423   ALT 8 10/27/2023 1434   ALT 9 10/23/2023 1423   ALKPHOS 154 (H) 10/27/2023 1434   BILITOT 0.5 10/27/2023 1434   BILITOT 0.6 10/23/2023 1423   GFRNONAA 47 (L) 10/28/2023 0553   GFRNONAA 41 (L) 10/23/2023 1423   GFRNONAA 86 12/21/2018 1513   GFRAA >60 09/30/2019 1128   GFRAA >60 06/02/2019 1430   GFRAA 100 12/21/2018 1513   Lipase     Component Value Date/Time   LIPASE 27 10/27/2023 1434    Studies/Results: CT ABDOMEN PELVIS W CONTRAST Result Date: 10/27/2023 EXAM: CT ABDOMEN AND PELVIS WITH CONTRAST 10/27/2023 08:09:10 PM TECHNIQUE: CT of the abdomen and pelvis was performed with the administration of intravenous contrast. Multiplanar reformatted images are provided for review. Automated exposure control, iterative reconstruction, and/or weight-based adjustment of the mA/kV was utilized to reduce the radiation dose to as low as reasonably achievable. 80 mL of iohexol  (OMNIPAQUE ) 300 MG/ML solution was administered intravenously. COMPARISON: 10/23/2023 CLINICAL HISTORY: with PO contrast, recent diagnosis  appendicitis. Per chart: Pt reports with RLQ pain x 1 week. Pt reports having a ct and it showed appendicitis. FINDINGS: LOWER CHEST: No acute abnormality. LIVER: Hepatic steatosis. GALLBLADDER AND BILE DUCTS: Gallbladder is unremarkable. No biliary ductal dilatation. SPLEEN: No acute abnormality. PANCREAS: No acute abnormality. ADRENAL GLANDS: No acute abnormality. KIDNEYS, URETERS AND BLADDER: No stones in the kidneys or ureters. No hydronephrosis. No perinephric or periureteral stranding. Urinary bladder is unremarkable. GI AND BOWEL: Stomach demonstrates no acute abnormality. Unchanged in appearance of the appendix which is mildly dilated measuring up to 9 mm with mural thickening and  hyper-enhancement. The degree of adjacent stranding and fluid appears decreased compared to 10/23/2023. There is no bowel obstruction. PERITONEUM AND RETROPERITONEUM: No ascites. No free air. VASCULATURE: Aorta is normal in caliber. Aortic atherosclerotic calcification. LYMPH NODES: No lymphadenopathy. REPRODUCTIVE ORGANS: No acute abnormality. BONES AND SOFT TISSUES: No acute osseous abnormality. No focal soft tissue abnormality. IMPRESSION: 1. Dilated and inflamed appendix similar to 10 / 16 / 25. The degree of adjacent inflammatory stranding has decreased. Electronically signed by: Norman Gatlin MD 10/27/2023 08:21 PM EDT RP Workstation: [id number]   DG Chest Portable 1 View Result Date: 10/27/2023 CLINICAL DATA:  Follow-up ground-glass opacities on recent CT EXAM: PORTABLE CHEST 1 VIEW COMPARISON:  05/21/2023 FINDINGS: Cardiomegaly. Right chest port catheter. Both lungs are clear. The visualized skeletal structures are unremarkable. IMPRESSION: Cardiomegaly without acute abnormality of the lungs on frontal chest radiograph. No focal airspace opacity. Electronically Signed   By: Marolyn JONETTA Jaksch M.D.   On: 10/27/2023 16:39    Anti-infectives: Anti-infectives (From admission, onward)    Start     Dose/Rate Route Frequency Ordered Stop   10/28/23 1400  cefTRIAXone  (ROCEPHIN ) 2 g in sodium chloride  0.9 % 100 mL IVPB  Status:  Discontinued        2 g 200 mL/hr over 30 Minutes Intravenous Every 24 hours 10/28/23 0651 10/28/23 0655   10/28/23 0800  metroNIDAZOLE (FLAGYL) IVPB 500 mg        500 mg 100 mL/hr over 60 Minutes Intravenous Every 12 hours 10/28/23 0651     10/28/23 0800  ciprofloxacin (CIPRO) IVPB 400 mg        400 mg 200 mL/hr over 60 Minutes Intravenous Every 12 hours 10/28/23 0652     10/27/23 1515  metroNIDAZOLE (FLAGYL) IVPB 500 mg        500 mg 100 mL/hr over 60 Minutes Intravenous  Once 10/27/23 1503 10/27/23 1632   10/27/23 1515  cefTRIAXone  (ROCEPHIN ) 1 g in sodium chloride  0.9 %  100 mL IVPB        1 g 200 mL/hr over 30 Minutes Intravenous  Once 10/27/23 1503 10/27/23 1601        Assessment/Plan Acute Appendicitis - Noted on CT 10/16 and repeat CT 10/20. Degree of inflammation had improved on CT 10/16 and was without obvious perforation or abscess - Recommend laparoscopic appendectomy in AM after Eliquis  has washed out if Cardiology does not think she needs any further cardiac evaluation prior to surgery. Final recommendations from Cardiology pending but currently they are not recommending any further cardiac evaluation prior to surgery. Will follow up on their note.  - I have explained the procedure, risks, and aftercare of Laparoscopic Appendectomy.  Risks include but are not limited to anesthesia (MI, CVA, death, aspiration, prolonged intubation), bleeding, infection, wound problems, hernia, injury to surrounding structures (viscus, nerves, blood vessels, ureter), need for conversion to open procedure or ileocecectomy, post operative  ileus or abscess, stump leak, stump appendicitis and increased risk of DVT/PE.  She seems to understand and agrees to proceed with surgery. - Cont abx - Okay for FLD today. NPO at midnight.  - AM labs   FEN - FLD, NPO at midnight. IVF per TRH VTE - SCDs, hold Eliquis  (last dose 10/20 AM). 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    LOS: 1 day    Ozell CHRISTELLA Shaper, [facility] 10/28/2023, 11:03 AM Please see Amion for pager number during day hours  7:00am-4:30pm

## 2023-10-28 NOTE — Progress Notes (Signed)
 Progress Note   Patient: Natalie Baker FMW:[id number] DOB: Nov 03, 1955 DOA: 10/27/2023     1 DOS: the patient was seen and examined on 10/28/2023   Brief hospital course: a 68 y.o. 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 The patient had an outpatient CT scan of the abdomen pelvis ordered by her oncologist Dr. Gatha 4 days ago.  The patient was called and told to come to the ER when the CT showed appendicitis. The patient reports that she has had right lower quadrant abdominal pain for 2 weeks.  She has had some nausea and vomiting and fever and chills but the fever and chills seem to have subsided. She also reports some cough and mild increased sob w exertion.  Assessment and Plan: Appendicitis - Surgery plans for laparoscopic appendectomy in AM after Eliquis  W/o - Hold Eliquis  - Continue IV Cipro   Repeat  CT abdomen pelvis reviewed   2. Mild CHF exacerbation - BNP is extremely elevated at greater than 6000 but the patient was not significantly short of breath during my examination. She had already received a dose of IV Lasix  prior to my exam.  - Cardiology following for preop assessment and assistance in management. - Continue Amiodarone , Farxiga , Losartan  - IV Lasix  instead of oral Torsemide  and spironolactone .   3. Acute on chronic renal failure - Mild - Follow creatinine.  Today Cr=1.73.  Baseline Cr= 1.43.   4. Chronic mantle cell lymphoma - No cure per patient report, chronic low level.   5. Hypothyroidism - Continue Synthroid .         Subjective: Feels well, denies abdominal pain, nausea, vomiting.  Physical Exam: General: No acute distress, well developed, well nourished HEENT: Normocephalic, atraumatic, PERRL Cardiovascular: Normal rate and rhythm. Distal pulses  intact. Pulmonary: Normal pulmonary effort, normal breath sounds Gastrointestinal: Nondistended abdomen, soft, tender in RLQ, hypoactive bowel sounds Musculoskeletal:Normal ROM, no lower ext edema Skin: Skin is warm and dry. Neuro: No focal deficits noted, AAOx3. PSYCH: Attentive and cooperative Vitals:   10/28/23 1230 10/28/23 1340 10/28/23 1415 10/28/23 1500  BP: (!) 97/56 (!) 93/59  (!) 95/57  Pulse: 71 72  73  Resp: 17 18  17   Temp:   97.8 F (36.6 C)   TempSrc:   Oral   SpO2: 96% 95%  92%  Weight:      Height:        Data Reviewed:    CBC    Component Value Date/Time   WBC 4.2 10/28/2023 0553   RBC 5.15 (H) 10/28/2023 0553   HGB 12.2 10/28/2023 0553   HGB 12.7 10/23/2023 1423   HCT 40.2 10/28/2023 0553   PLT 425 (H) 10/28/2023 0553   PLT 491 (H) 10/23/2023 1423   MCV 78.1 (L) 10/28/2023 0553   MCH 23.7 (L) 10/28/2023 0553   MCHC 30.3 10/28/2023 0553   RDW 19.9 (H) 10/28/2023 0553   LYMPHSABS 0.4 (L) 10/27/2023 1434   MONOABS 0.5 10/27/2023 1434   EOSABS 0.0 10/27/2023 1434   BASOSABS 0.1 10/27/2023 1434    CMP     Component Value Date/Time   NA 136 10/28/2023 0553   K 3.9 10/28/2023 0553   CL 96 (L) 10/28/2023 0553   CO2 28 10/28/2023 0553   GLUCOSE 92 10/28/2023 0553   BUN 16 10/28/2023 0553  CREATININE 1.26 (H) 10/28/2023 0553   CREATININE 1.42 (H) 10/23/2023 1423   CREATININE 1.06 (H) 12/13/2022 0956   CALCIUM  9.0 10/28/2023 0553   PROT 6.4 (L) 10/27/2023 1434   ALBUMIN 3.7 10/27/2023 1434   AST 21 10/27/2023 1434   AST 13 (L) 10/23/2023 1423   ALT 8 10/27/2023 1434   ALT 9 10/23/2023 1423   ALKPHOS 154 (H) 10/27/2023 1434   BILITOT 0.5 10/27/2023 1434   BILITOT 0.6 10/23/2023 1423   GFR 49.24 (L) 07/07/2023 1017   EGFR 58 (L) 12/13/2022 0956   GFRNONAA 47 (L) 10/28/2023 0553   GFRNONAA 41 (L) 10/23/2023 1423   GFRNONAA 86 12/21/2018 1513    Family Communication: None at bedside  Disposition: Status is: Inpatient Remains inpatient  appropriate because: Appendectomy tommorow  Planned Discharge Destination: Home    Time spent: 35  minutes  Author: Landon FORBES Baller, MD 10/28/2023 3:30 PM  For on call review [email or web address].

## 2023-10-29 ENCOUNTER — Encounter (HOSPITAL_COMMUNITY): Payer: Self-pay | Admitting: Internal Medicine

## 2023-10-29 ENCOUNTER — Encounter (HOSPITAL_COMMUNITY)
Admission: EM | Disposition: A | Discharge status: Home / Self Care | Payer: Self-pay | Source: Home / Self Care | Attending: Internal Medicine

## 2023-10-29 ENCOUNTER — Inpatient Hospital Stay (HOSPITAL_COMMUNITY): Payer: Self-pay | Admitting: Anesthesiology

## 2023-10-29 ENCOUNTER — Encounter (HOSPITAL_COMMUNITY): Payer: Self-pay | Admitting: Anesthesiology

## 2023-10-29 DIAGNOSIS — Z87891 Personal history of nicotine dependence: Secondary | ICD-10-CM | POA: Diagnosis not present

## 2023-10-29 DIAGNOSIS — I700 Atherosclerosis of aorta: Secondary | ICD-10-CM | POA: Diagnosis not present

## 2023-10-29 DIAGNOSIS — I10 Essential (primary) hypertension: Secondary | ICD-10-CM

## 2023-10-29 DIAGNOSIS — E039 Hypothyroidism, unspecified: Secondary | ICD-10-CM | POA: Diagnosis not present

## 2023-10-29 DIAGNOSIS — K37 Unspecified appendicitis: Secondary | ICD-10-CM | POA: Diagnosis not present

## 2023-10-29 DIAGNOSIS — E785 Hyperlipidemia, unspecified: Secondary | ICD-10-CM | POA: Diagnosis not present

## 2023-10-29 HISTORY — PX: LAPAROSCOPIC APPENDECTOMY: SHX408

## 2023-10-29 LAB — COMPREHENSIVE METABOLIC PANEL WITH GFR
ALT: 10 U/L (ref 0–44)
AST: 28 U/L (ref 15–41)
Albumin: 3.8 g/dL (ref 3.5–5.0)
Alkaline Phosphatase: 131 U/L — ABNORMAL HIGH (ref 38–126)
Anion gap: 15 (ref 5–15)
BUN: 13 mg/dL (ref 8–23)
CO2: 25 mmol/L (ref 22–32)
Calcium: 8.7 mg/dL — ABNORMAL LOW (ref 8.9–10.3)
Chloride: 93 mmol/L — ABNORMAL LOW (ref 98–111)
Creatinine, Ser: 1.46 mg/dL — ABNORMAL HIGH (ref 0.44–1.00)
GFR, Estimated: 39 mL/min — ABNORMAL LOW (ref >60)
Glucose, Bld: 247 mg/dL — ABNORMAL HIGH (ref 70–99)
Potassium: 4.5 mmol/L (ref 3.5–5.1)
Sodium: 134 mmol/L — ABNORMAL LOW (ref 135–145)
Total Bilirubin: 0.3 mg/dL (ref 0.0–1.2)
Total Protein: 5.4 g/dL — ABNORMAL LOW (ref 6.5–8.1)

## 2023-10-29 LAB — CBC
HCT: 39.6 % (ref 36.0–46.0)
Hemoglobin: 11.7 g/dL — ABNORMAL LOW (ref 12.0–15.0)
MCH: 22.9 pg — ABNORMAL LOW (ref 26.0–34.0)
MCHC: 29.5 g/dL — ABNORMAL LOW (ref 30.0–36.0)
MCV: 77.6 fL — ABNORMAL LOW (ref 80.0–100.0)
Platelets: 413 K/uL — ABNORMAL HIGH (ref 150–400)
RBC: 5.1 MIL/uL (ref 3.87–5.11)
RDW: 19.4 % — ABNORMAL HIGH (ref 11.5–15.5)
WBC: 3.7 K/uL — ABNORMAL LOW (ref 4.0–10.5)
nRBC: 0 % (ref 0.0–0.2)

## 2023-10-29 LAB — MAGNESIUM: Magnesium: 1.9 mg/dL (ref 1.7–2.4)

## 2023-10-29 SURGERY — APPENDECTOMY, LAPAROSCOPIC
Anesthesia: General | Site: Abdomen

## 2023-10-29 MED ORDER — FENTANYL CITRATE (PF) 100 MCG/2ML IJ SOLN
INTRAMUSCULAR | Status: DC | PRN
  Administered 2023-10-29 (×2): 50 ug via INTRAVENOUS

## 2023-10-29 MED ORDER — ALBUMIN HUMAN 5 % IV SOLN
INTRAVENOUS | Status: AC
  Filled 2023-10-29: qty 250

## 2023-10-29 MED ORDER — ROCURONIUM BROMIDE 10 MG/ML (PF) SYRINGE
PREFILLED_SYRINGE | INTRAVENOUS | Status: DC | PRN
  Administered 2023-10-29: 40 mg via INTRAVENOUS

## 2023-10-29 MED ORDER — LIDOCAINE HCL (PF) 2 % IJ SOLN
INTRAMUSCULAR | Status: AC
  Filled 2023-10-29: qty 5

## 2023-10-29 MED ORDER — KETOROLAC TROMETHAMINE 30 MG/ML IJ SOLN
15.0000 mg | Freq: Once | INTRAMUSCULAR | Status: AC | PRN
  Administered 2023-10-29: 15 mg via INTRAVENOUS

## 2023-10-29 MED ORDER — MIDAZOLAM HCL 5 MG/5ML IJ SOLN
INTRAMUSCULAR | Status: DC | PRN
  Administered 2023-10-29: 2 mg via INTRAVENOUS

## 2023-10-29 MED ORDER — OXYCODONE HCL 5 MG PO TABS
10.0000 mg | ORAL_TABLET | ORAL | Status: DC | PRN
Start: 2023-10-29 — End: 2023-10-30
  Administered 2023-10-29: 10 mg via ORAL
  Filled 2023-10-29: qty 2

## 2023-10-29 MED ORDER — SUGAMMADEX SODIUM 200 MG/2ML IV SOLN
INTRAVENOUS | Status: DC | PRN
  Administered 2023-10-29: 200 mg via INTRAVENOUS

## 2023-10-29 MED ORDER — OXYCODONE HCL 5 MG PO TABS
5.0000 mg | ORAL_TABLET | Freq: Once | ORAL | Status: AC | PRN
  Administered 2023-10-29: 5 mg via ORAL

## 2023-10-29 MED ORDER — LACTATED RINGERS IV SOLN
INTRAVENOUS | Status: DC
Start: 2023-10-29 — End: 2023-10-29

## 2023-10-29 MED ORDER — PROPOFOL 10 MG/ML IV BOLUS
INTRAVENOUS | Status: DC | PRN
  Administered 2023-10-29: 100 mg via INTRAVENOUS

## 2023-10-29 MED ORDER — SUGAMMADEX SODIUM 200 MG/2ML IV SOLN
INTRAVENOUS | Status: AC
  Filled 2023-10-29: qty 2

## 2023-10-29 MED ORDER — LIDOCAINE HCL (PF) 2 % IJ SOLN
INTRAMUSCULAR | Status: DC | PRN
  Administered 2023-10-29: 60 mg via INTRADERMAL

## 2023-10-29 MED ORDER — HYDROMORPHONE HCL 1 MG/ML IJ SOLN
INTRAMUSCULAR | Status: AC
  Filled 2023-10-29: qty 1

## 2023-10-29 MED ORDER — ONDANSETRON HCL 4 MG/2ML IJ SOLN: 4.0000 mg | Freq: Once | INTRAMUSCULAR | Status: DC | PRN

## 2023-10-29 MED ORDER — OXYCODONE HCL 5 MG PO TABS: 5.0000 mg | ORAL_TABLET | ORAL | Status: DC | PRN

## 2023-10-29 MED ORDER — ONDANSETRON HCL 4 MG/2ML IJ SOLN
INTRAMUSCULAR | Status: DC | PRN
  Administered 2023-10-29: 4 mg via INTRAVENOUS

## 2023-10-29 MED ORDER — ALBUMIN HUMAN 5 % IV SOLN
12.5000 g | Freq: Once | INTRAVENOUS | Status: AC
  Administered 2023-10-29: 12.5 g via INTRAVENOUS

## 2023-10-29 MED ORDER — KETOROLAC TROMETHAMINE 15 MG/ML IJ SOLN
INTRAMUSCULAR | Status: AC
Start: 2023-10-29 — End: 2023-10-29
  Filled 2023-10-29: qty 1

## 2023-10-29 MED ORDER — DEXAMETHASONE SOD PHOSPHATE PF 10 MG/ML IJ SOLN
INTRAMUSCULAR | Status: DC | PRN
  Administered 2023-10-29: 10 mg via INTRAVENOUS

## 2023-10-29 MED ORDER — HYDROMORPHONE HCL 1 MG/ML IJ SOLN: 0.2500 mg | INTRAMUSCULAR | Status: DC | PRN

## 2023-10-29 MED ORDER — OXYCODONE HCL 5 MG/5ML PO SOLN: 5.0000 mg | Freq: Once | ORAL | Status: AC | PRN

## 2023-10-29 MED ORDER — PROPOFOL 10 MG/ML IV BOLUS
INTRAVENOUS | Status: AC
  Filled 2023-10-29: qty 20

## 2023-10-29 MED ORDER — ONDANSETRON HCL 4 MG/2ML IJ SOLN
INTRAMUSCULAR | Status: AC
  Filled 2023-10-29: qty 2

## 2023-10-29 MED ORDER — MIDAZOLAM HCL 2 MG/2ML IJ SOLN
INTRAMUSCULAR | Status: AC
  Filled 2023-10-29: qty 2

## 2023-10-29 MED ORDER — BUPIVACAINE-EPINEPHRINE (PF) 0.25% -1:200000 IJ SOLN
INTRAMUSCULAR | Status: DC | PRN
  Administered 2023-10-29: 30 mL via PERINEURAL

## 2023-10-29 MED ORDER — ORAL CARE MOUTH RINSE: 15.0000 mL | Freq: Once | OROMUCOSAL | Status: AC

## 2023-10-29 MED ORDER — 0.9 % SODIUM CHLORIDE (POUR BTL) OPTIME
TOPICAL | Status: DC | PRN
  Administered 2023-10-29: 1000 mL

## 2023-10-29 MED ORDER — FENTANYL CITRATE (PF) 100 MCG/2ML IJ SOLN
INTRAMUSCULAR | Status: AC
  Filled 2023-10-29: qty 2

## 2023-10-29 MED ORDER — CHLORHEXIDINE GLUCONATE 0.12 % MT SOLN
15.0000 mL | Freq: Once | OROMUCOSAL | Status: AC
  Administered 2023-10-29: 15 mL via OROMUCOSAL

## 2023-10-29 MED ORDER — EPHEDRINE SULFATE-NACL 50-0.9 MG/10ML-% IV SOSY
PREFILLED_SYRINGE | INTRAVENOUS | Status: DC | PRN
  Administered 2023-10-29: 5 mg via INTRAVENOUS
  Administered 2023-10-29: 10 mg via INTRAVENOUS

## 2023-10-29 MED ORDER — ROCURONIUM BROMIDE 10 MG/ML (PF) SYRINGE
PREFILLED_SYRINGE | INTRAVENOUS | Status: AC
  Filled 2023-10-29: qty 10

## 2023-10-29 MED ORDER — OXYCODONE HCL 5 MG PO TABS
ORAL_TABLET | ORAL | Status: AC
  Filled 2023-10-29: qty 1

## 2023-10-29 MED ORDER — BUPIVACAINE-EPINEPHRINE (PF) 0.25% -1:200000 IJ SOLN
INTRAMUSCULAR | Status: AC
  Filled 2023-10-29: qty 30

## 2023-10-29 SURGICAL SUPPLY — 33 items
BAG COUNTER SPONGE SURGICOUNT (BAG) ×1 IMPLANT
CHLORAPREP W/TINT 26 (MISCELLANEOUS) ×1 IMPLANT
CLIP APPLIE ROT 10 11.4 M/L (STAPLE) IMPLANT
CORD SCALPEL HARMONIC (MISCELLANEOUS) ×1 IMPLANT
COVER SURGICAL LIGHT HANDLE (MISCELLANEOUS) ×1 IMPLANT
DERMABOND ADVANCED .7 DNX12 (GAUZE/BANDAGES/DRESSINGS) ×1 IMPLANT
DRAIN CHANNEL 19F RND (DRAIN) IMPLANT
EVACUATOR SILICONE 100CC (DRAIN) IMPLANT
GLOVE BIO SURGEON STRL SZ7.5 (GLOVE) ×1 IMPLANT
GLOVE INDICATOR 8.0 STRL GRN (GLOVE) ×1 IMPLANT
GOWN STRL REUS W/ TWL XL LVL3 (GOWN DISPOSABLE) ×1 IMPLANT
GRASPER SUT TROCAR 14GX15 (MISCELLANEOUS) ×1 IMPLANT
IRRIGATION SUCT STRKRFLW 2 WTP (MISCELLANEOUS) IMPLANT
KIT BASIN OR (CUSTOM PROCEDURE TRAY) ×1 IMPLANT
KIT TURNOVER KIT A (KITS) ×1 IMPLANT
LIGASURE LAP MARYLAND 5MM 37CM (ELECTROSURGICAL) ×1 IMPLANT
NDL INSUFFLATION 14GA 120MM (NEEDLE) ×1 IMPLANT
NEEDLE INSUFFLATION 14GA 120MM (NEEDLE) ×1 IMPLANT
RELOAD STAPLE 60 2.6 WHT THN (STAPLE) IMPLANT
SCISSORS LAP 5X35 DISP (ENDOMECHANICALS) IMPLANT
SET TUBE SMOKE EVAC HIGH FLOW (TUBING) ×1 IMPLANT
SLEEVE ADV FIXATION 5X100MM (TROCAR) ×1 IMPLANT
SPIKE FLUID TRANSFER (MISCELLANEOUS) ×1 IMPLANT
STAPLER ECHELON LONG 60 440 (INSTRUMENTS) ×1 IMPLANT
SUT ETHILON 3 0 PS 1 (SUTURE) IMPLANT
SUT MNCRL AB 4-0 PS2 18 (SUTURE) ×1 IMPLANT
SUT VICRYL 0 UR6 27IN ABS (SUTURE) ×1 IMPLANT
SYSTEM BAG RETRIEVAL 10MM (BASKET) IMPLANT
TOWEL OR 17X26 10 PK STRL BLUE (TOWEL DISPOSABLE) IMPLANT
TRAY FOLEY MTR SLVR 14FR STAT (SET/KITS/TRAYS/PACK) IMPLANT
TRAY LAPAROSCOPIC (CUSTOM PROCEDURE TRAY) ×1 IMPLANT
TROCAR ADV FIXATION 12X100MM (TROCAR) ×1 IMPLANT
TROCAR ADV FIXATION 5X100MM (TROCAR) ×1 IMPLANT

## 2023-10-29 NOTE — Anesthesia Postprocedure Evaluation (Signed)
 Anesthesia Post Note  Patient: Natalie Baker  Procedure(s) Performed: APPENDECTOMY, LAPAROSCOPIC     Patient location during evaluation: PACU Anesthesia Type: General Level of consciousness: awake and alert Pain management: pain level controlled Vital Signs Assessment: post-procedure vital signs reviewed and stable Respiratory status: spontaneous breathing, nonlabored ventilation, respiratory function stable and patient connected to nasal cannula oxygen Cardiovascular status: blood pressure returned to baseline and stable Postop Assessment: no apparent nausea or vomiting Anesthetic complications: no   No notable events documented.  Last Vitals:  Vitals:   10/29/23 0459 10/29/23 0816  BP: (!) 102/52 (!) 109/56  Pulse: 74 77  Resp: 18 16  Temp: 36.9 C 36.9 C  SpO2: 98% 97%    Last Pain:  Vitals:   10/29/23 1034  TempSrc:   PainSc: 4                  Garnette [name]

## 2023-10-29 NOTE — Transfer of Care (Signed)
 Immediate Anesthesia Transfer of Care Note  Patient: Natalie Baker  Procedure(s) Performed: APPENDECTOMY, LAPAROSCOPIC  Patient Location: PACU  Anesthesia Type:General  Level of Consciousness: awake, alert , and oriented  Airway & Oxygen Therapy: Patient Spontanous Breathing and Patient connected to face mask oxygen  Post-op Assessment: Report given to RN and Post -op Vital signs reviewed and stable  Post vital signs: Reviewed and stable  Last Vitals:  Vitals Value Taken Time  BP 107/57 10/29/23 10:15  Temp    Pulse 77 10/29/23 10:17  Resp 19 10/29/23 10:17  SpO2 93 % 10/29/23 10:17  Vitals shown include unfiled device data.  Last Pain:  Vitals:   10/29/23 0837  TempSrc:   PainSc: 0-No pain      Patients Stated Pain Goal: 4 (10/29/23 0837)  Complications: No notable events documented.

## 2023-10-29 NOTE — Progress Notes (Addendum)
 Progress Note   Patient: Natalie Baker FMW:[id number] DOB: 02-05-1955 DOA: 10/27/2023     2 DOS: the patient was seen and examined on 10/29/2023   Brief hospital course: a 68 y.o. 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 The patient had an outpatient CT scan of the abdomen pelvis ordered by her oncologist Dr. Gatha 4 days ago.  The patient was called and told to come to the ER when the CT showed appendicitis. The patient reports that she has had right lower quadrant abdominal pain for 2 weeks.  She has had some nausea and vomiting and fever and chills but the fever and chills seem to have subsided. She also reports some cough and mild increased sob w exertion.  Assessment and Plan: Appendicitis - Surgery plans for laparoscopic appendectomy today Hold Eliquis  - Continue IV Cipro   Repeat  CT abdomen pelvis reviewed   2. Mild CHF exacerbation - BNP is extremely elevated at greater than 6000 but the patient was not significantly short of breath during my examination. She had already received a dose of IV Lasix  prior to my exam.  - Cardiology following for preop assessment and assistance in management. - Continue Amiodarone , Farxiga , Losartan  - IV Lasix  instead of oral Torsemide  and spironolactone . - respiratory failure has been ruled out, hypoxemia only    3. Acute on chronic renal failure - CKD Stage 3a  - Follow creatinine.  Today Cr=1.73.  Baseline Cr= 1.43.   4. Chronic mantle cell lymphoma - No cure per patient report, chronic low level.   5. Hypothyroidism - Continue Synthroid .     Subjective: Physical Exam: General: No acute distress, well developed, well nourished HEENT: Normocephalic, atraumatic, PERRL Cardiovascular: Normal rate and rhythm. Distal pulses  intact. Pulmonary: Normal pulmonary effort, normal breath sounds Gastrointestinal: Nondistended abdomen, soft, tender in RLQ, hypoactive bowel sounds Musculoskeletal:Normal ROM, no lower ext edema Skin: Skin is warm and dry. Neuro: No focal deficits noted, AAOx3. PSYCH: Attentive and cooperative Vitals:   10/28/23 2028 10/29/23 0459 10/29/23 0806 10/29/23 0816  BP: (!) 92/46 (!) 102/52  (!) 109/56  Pulse: 78 74  77  Resp: 18 18  16   Temp: 98.3 F (36.8 C) 98.4 F (36.9 C)  98.5 F (36.9 C)  TempSrc: Oral Oral  Oral  SpO2: 95% 98%  97%  Weight:   80 kg   Height:   5' 2 (1.575 m)     Data Reviewed:    CBC    Component Value Date/Time   WBC 3.7 (L) 10/29/2023 0500   RBC 5.10 10/29/2023 0500   HGB 11.7 (L) 10/29/2023 0500   HGB 12.7 10/23/2023 1423   HCT 39.6 10/29/2023 0500   PLT 413 (H) 10/29/2023 0500   PLT 491 (H) 10/23/2023 1423   MCV 77.6 (L) 10/29/2023 0500   MCH 22.9 (L) 10/29/2023 0500   MCHC 29.5 (L) 10/29/2023 0500   RDW 19.4 (H) 10/29/2023 0500   LYMPHSABS 0.4 (L) 10/27/2023 1434   MONOABS 0.5 10/27/2023 1434   EOSABS 0.0 10/27/2023 1434   BASOSABS 0.1 10/27/2023 1434    CMP     Component Value Date/Time   NA 136 10/28/2023 0553   K 3.9 10/28/2023 0553   CL 96 (L) 10/28/2023 0553   CO2 28 10/28/2023 0553   GLUCOSE 92  10/28/2023 0553   BUN 16 10/28/2023 0553   CREATININE 1.26 (H) 10/28/2023 0553   CREATININE 1.42 (H) 10/23/2023 1423   CREATININE 1.06 (H) 12/13/2022 0956   CALCIUM  9.0 10/28/2023 0553   PROT 6.4 (L) 10/27/2023 1434   ALBUMIN 3.7 10/27/2023 1434   AST 21 10/27/2023 1434   AST 13 (L) 10/23/2023 1423   ALT 8 10/27/2023 1434   ALT 9 10/23/2023 1423   ALKPHOS 154 (H) 10/27/2023 1434   BILITOT 0.5 10/27/2023 1434   BILITOT 0.6 10/23/2023 1423   GFR 49.24 (L) 07/07/2023 1017   EGFR 58 (L) 12/13/2022 0956   GFRNONAA 47 (L) 10/28/2023 0553   GFRNONAA 41 (L) 10/23/2023 1423   GFRNONAA 86 12/21/2018 1513    Family Communication: None  at bedside  Disposition: Status is: Inpatient Remains inpatient appropriate because: Appendectomy tommorow  Planned Discharge Destination: Home    Time spent: 35  minutes  Author: Landon FORBES Baller, MD 10/29/2023 9:53 AM  For on call review [email or web address].

## 2023-10-29 NOTE — Anesthesia Procedure Notes (Signed)
 Procedure Name: Intubation Date/Time: 10/29/2023 9:27 AM  Performed by: Toshiko Kemler D, CRNAPre-anesthesia Checklist: Patient identified, Emergency Drugs available, Suction available and Patient being monitored Patient Re-evaluated:Patient Re-evaluated prior to induction Oxygen Delivery Method: Circle system utilized Preoxygenation: Pre-oxygenation with 100% oxygen Induction Type: IV induction Ventilation: Mask ventilation without difficulty Laryngoscope Size: Mac and 3 Grade View: Grade II Tube type: Oral Tube size: 7.0 mm Number of attempts: 1 Airway Equipment and Method: Stylet and Oral airway Placement Confirmation: ETT inserted through vocal cords under direct vision, positive ETCO2 and breath sounds checked- equal and bilateral Secured at: 20 cm Tube secured with: Tape Dental Injury: Teeth and Oropharynx as per pre-operative assessment

## 2023-10-29 NOTE — Progress Notes (Signed)
 * Day of Surgery *  Subjective: Seen in preop  Objective: Vital signs in last 24 hours: Temp:  [97.8 F (36.6 C)-98.5 F (36.9 C)] 98.5 F (36.9 C) (10/22 0816) Pulse Rate:  [67-78] 77 (10/22 0816) Resp:  [16-20] 16 (10/22 0816) BP: (92-109)/(39-85) 109/56 (10/22 0816) SpO2:  [92 %-99 %] 97 % (10/22 0816) FiO2 (%):  [4 %] 4 % (10/22 0816) Weight:  [80 kg-80.1 kg] 80 kg (10/22 0806) Last BM Date : 10/27/23  Intake/Output from previous day: 10/21 0701 - 10/22 0700 In: 722.5 [P.O.:120; IV Piggyback:602.5] Out: -  Intake/Output this shift: No intake/output data recorded.  PE: Gen:  Alert, NAD, pleasant Card:  RRR Pulm:  Rate and effort normal on o2 Abd: Soft, no distension, RLQ ttp without rigidity or guarding.  Psych: A&Ox3   Lab Results:  Recent Labs    10/28/23 0553 10/29/23 0500  WBC 4.2 3.7*  HGB 12.2 11.7*  HCT 40.2 39.6  PLT 425* 413*   BMET Recent Labs    10/27/23 1434 10/28/23 0553  NA 136 136  K 3.8 3.9  CL 96* 96*  CO2 28 28  GLUCOSE 87 92  BUN 16 16  CREATININE 1.73* 1.26*  CALCIUM  9.6 9.0   PT/INR No results for input(s): LABPROT, INR in the last 72 hours. CMP     Component Value Date/Time   NA 136 10/28/2023 0553   K 3.9 10/28/2023 0553   CL 96 (L) 10/28/2023 0553   CO2 28 10/28/2023 0553   GLUCOSE 92 10/28/2023 0553   BUN 16 10/28/2023 0553   CREATININE 1.26 (H) 10/28/2023 0553   CREATININE 1.42 (H) 10/23/2023 1423   CREATININE 1.06 (H) 12/13/2022 0956   CALCIUM  9.0 10/28/2023 0553   PROT 6.4 (L) 10/27/2023 1434   ALBUMIN 3.7 10/27/2023 1434   AST 21 10/27/2023 1434   AST 13 (L) 10/23/2023 1423   ALT 8 10/27/2023 1434   ALT 9 10/23/2023 1423   ALKPHOS 154 (H) 10/27/2023 1434   BILITOT 0.5 10/27/2023 1434   BILITOT 0.6 10/23/2023 1423   GFRNONAA 47 (L) 10/28/2023 0553   GFRNONAA 41 (L) 10/23/2023 1423   GFRNONAA 86 12/21/2018 1513   GFRAA >60 09/30/2019 1128   GFRAA >60 06/02/2019 1430   GFRAA 100 12/21/2018 1513    Lipase     Component Value Date/Time   LIPASE 27 10/27/2023 1434    Studies/Results: CT ABDOMEN PELVIS W CONTRAST Result Date: 10/27/2023 EXAM: CT ABDOMEN AND PELVIS WITH CONTRAST 10/27/2023 08:09:10 PM TECHNIQUE: CT of the abdomen and pelvis was performed with the administration of intravenous contrast. Multiplanar reformatted images are provided for review. Automated exposure control, iterative reconstruction, and/or weight-based adjustment of the mA/kV was utilized to reduce the radiation dose to as low as reasonably achievable. 80 mL of iohexol  (OMNIPAQUE ) 300 MG/ML solution was administered intravenously. COMPARISON: 10/23/2023 CLINICAL HISTORY: with PO contrast, recent diagnosis appendicitis. Per chart: Pt reports with RLQ pain x 1 week. Pt reports having a ct and it showed appendicitis. FINDINGS: LOWER CHEST: No acute abnormality. LIVER: Hepatic steatosis. GALLBLADDER AND BILE DUCTS: Gallbladder is unremarkable. No biliary ductal dilatation. SPLEEN: No acute abnormality. PANCREAS: No acute abnormality. ADRENAL GLANDS: No acute abnormality. KIDNEYS, URETERS AND BLADDER: No stones in the kidneys or ureters. No hydronephrosis. No perinephric or periureteral stranding. Urinary bladder is unremarkable. GI AND BOWEL: Stomach demonstrates no acute abnormality. Unchanged in appearance of the appendix which is mildly dilated measuring up to 9 mm with  mural thickening and hyper-enhancement. The degree of adjacent stranding and fluid appears decreased compared to 10/23/2023. There is no bowel obstruction. PERITONEUM AND RETROPERITONEUM: No ascites. No free air. VASCULATURE: Aorta is normal in caliber. Aortic atherosclerotic calcification. LYMPH NODES: No lymphadenopathy. REPRODUCTIVE ORGANS: No acute abnormality. BONES AND SOFT TISSUES: No acute osseous abnormality. No focal soft tissue abnormality. IMPRESSION: 1. Dilated and inflamed appendix similar to 10 / 16 / 25. The degree of adjacent inflammatory  stranding has decreased. Electronically signed by: Norman Gatlin MD 10/27/2023 08:21 PM EDT RP Workstation: [id number]   DG Chest Portable 1 View Result Date: 10/27/2023 CLINICAL DATA:  Follow-up ground-glass opacities on recent CT EXAM: PORTABLE CHEST 1 VIEW COMPARISON:  05/21/2023 FINDINGS: Cardiomegaly. Right chest port catheter. Both lungs are clear. The visualized skeletal structures are unremarkable. IMPRESSION: Cardiomegaly without acute abnormality of the lungs on frontal chest radiograph. No focal airspace opacity. Electronically Signed   By: Marolyn JONETTA Jaksch M.D.   On: 10/27/2023 16:39    Anti-infectives: Anti-infectives (From admission, onward)    Start     Dose/Rate Route Frequency Ordered Stop   10/28/23 1400  cefTRIAXone  (ROCEPHIN ) 2 g in sodium chloride  0.9 % 100 mL IVPB  Status:  Discontinued        2 g 200 mL/hr over 30 Minutes Intravenous Every 24 hours 10/28/23 0651 10/28/23 0655   10/28/23 0800  [MAR Hold]  metroNIDAZOLE (FLAGYL) IVPB 500 mg        (MAR Hold since Wed 10/29/2023 at 0803.Hold Reason: Transfer to a Procedural area)   500 mg 100 mL/hr over 60 Minutes Intravenous Every 12 hours 10/28/23 0651     10/28/23 0800  [MAR Hold]  ciprofloxacin (CIPRO) IVPB 400 mg        (MAR Hold since Wed 10/29/2023 at 0803.Hold Reason: Transfer to a Procedural area)   400 mg 200 mL/hr over 60 Minutes Intravenous Every 12 hours 10/28/23 0652     10/27/23 1515  metroNIDAZOLE (FLAGYL) IVPB 500 mg        500 mg 100 mL/hr over 60 Minutes Intravenous  Once 10/27/23 1503 10/27/23 1632   10/27/23 1515  cefTRIAXone  (ROCEPHIN ) 1 g in sodium chloride  0.9 % 100 mL IVPB        1 g 200 mL/hr over 30 Minutes Intravenous  Once 10/27/23 1503 10/27/23 1601        Assessment/Plan Acute Appendicitis - Noted on CT 10/16 and repeat CT 10/20. Degree of inflammation had improved on CT 10/16 and was without obvious perforation or abscess - Recommend laparoscopic appendectomy this morning.  After  Eliquis  has washed out and Cardiology does not think she needs any further cardiac evaluation prior to surgery.  - I have explained the procedure, risks, and aftercare of Laparoscopic Appendectomy.  Risks include but are not limited to anesthesia (MI, CVA, death, aspiration, prolonged intubation), bleeding, infection, wound problems, hernia, injury to surrounding structures (viscus, nerves, blood vessels, ureter), need for conversion to open procedure or ileocecectomy, post operative ileus or abscess, stump leak, stump appendicitis and increased risk of DVT/PE.  Worsening CHF issues.  She seems to understand and agrees to proceed with surgery. 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    LOS: 2 days    Deward JINNY Foy, MD [facility] 10/29/2023, 8:50 AM Please see Amion for pager number during day hours 7:00am-4:30pm

## 2023-10-29 NOTE — Progress Notes (Signed)
   10/29/23 1610  TOC Brief Assessment  Insurance and Status Reviewed  Patient has primary care physician Yes (McGowen, Aleene DEL, MD)  Home environment has been reviewed Home  Prior level of function: Independent  Prior/Current Home Services No current home services  Social Drivers of Health Review SDOH reviewed no interventions necessary  Readmission risk has been reviewed Yes  Transition of care needs no transition of care needs at this time

## 2023-10-29 NOTE — Op Note (Addendum)
 Patient: Natalie Baker (05/11/1955, [id number])  Date of Surgery: 10/29/2023  Preoperative Diagnosis: APPENDICITIS   Postoperative Diagnosis: Appendicitis  Surgical Procedure: APPENDECTOMY, LAPAROSCOPIC: 44970 (CPT)    Operative Team Members:  Surgeons and Role:    * Rogers Ditter, Deward PARAS, MD - Primary   Anesthesiologist: Jefm Garnette LABOR, MD CRNA: Dwana Gong D, CRNA   Anesthesia: General   Fluids:  No intake/output data recorded.  Complications: * No complications entered in OR log *  Drains:  none   Specimen:  ID Type Source Tests Collected by Time Destination  1 : Appendix Tissue PATH Appendix SURGICAL PATHOLOGY Reyna Lorenzi, Deward PARAS, MD 10/29/2023 [phone]      Disposition:  PACU - hemodynamically stable.  Plan of Care: Discharge to home after PACU    Indications for Procedure: Natalie Baker is a 68 y.o. female who presented with abdominal pain.  History, physical and imaging was concerning for appendicitis, so laparoscopic appendectomy was recommended for the patient.  The procedure itself, as well as the risks, benefits and alternatives were discussed with the patient.  Risks discussed included but were not limited to the risk of bleeding, infection, damage to nearby structures, need to convert to open procedure, incisional hernia, and the need for additional procedures or surgeries.  With this discussion complete and all questions answered the patient granted consent to proceed.  Findings: Inflamed appendix  Infection status: Patient: Natalie Baker Emergency General Surgery Service Patient Case: Urgent Infection Present At Time Of Surgery (PATOS): Inflamed appendix   Description of Procedure:   On the date stated above, the patient was taken to the operating room suite and placed in supine positioning with the left arm tucked.  Sequential compression devices were placed on the lower extremities to prevent blood clots.  General endotracheal anesthesia  was induced.  The patient urinated just prior to surgery so a foley catheter was not placed.  Preoperative antibiotics were given.  The patient's abdomen was prepped and draped in the usual sterile fashion.  A time-out was completed verifying the correct patient, procedure, positioning and equipment needed for the case.  We began by anesthetizing the skin with local anesthetic and then making a 5 mm incision just below the umbilicus.  We dissected through the subcutaneous tissues to the fascia.  The fascia was grasped and elevated using a Kocher clamp.  A Veress needle was inserted into the abdomen and the abdomen was insufflated to 15 mmHg.  A 5 mm trocar was inserted in this position under optical guidance and then the abdomen was inspected.  There was no trauma to the underlying viscera with initial trocar placement.  Any abnormal findings, other than inflammation in the right lower quadrant, are listed above in the findings section.  Two additional trocars were placed, one 5 mm trocar suprapubically and one 12 mm trocar in the left lower quadrant.  These were placed under direct vision without any trauma to the underlying viscera.    The patient was then placed in head down, left side down positioning.  The appendix was identified and dissected free from its attachments to the abdominal wall, small intestine and cecum.  A window was created in the mesoappendix using blunt dissection.  We used one 60 mm white load of the endoscopic linear stapler to divide the base of the appendix from the cecum.  Then the harmonic scalpel was used to divide the mesoappendix.  The appendix was removed through the 12 mm port site in the  left lower quadrant.  The operative field was dry on final inspection. The staple line was well formed.  There was good hemostasis at the end of the case.  At this point we directed our attention to closure.  The patient was moved back to a level position.  The 12 mm trocar site was closed at  the fascial level using an 0-vicryl on a fascial suture passer.  The abdomen was desufflated.  The skin was closed using 4-0 Monocryl and dermabond.  All sponge and needle counts were correct at the end of the case.    Deward Foy, MD General, Bariatric, & Minimally Invasive Surgery [facility], [place]

## 2023-10-29 NOTE — Discharge Instructions (Signed)
 APPENDECTOMY POST OPERATIVE INSTRUCTIONS  Thinking Clearly  The anesthesia may cause you to feel different for 1 or 2 days. Do not drive, drink alcohol, or make any big decisions for at least 2 days.  Nutrition When you wake up, you will be able to drink small amounts of liquid. If you do not feel sick, you can slowly advance your diet to regular foods. Continue to drink lots of fluids, usually about 8 to 10 glasses per day. Eat a high-fiber diet so you don't strain during bowel movements. High-Fiber Foods Foods high in fiber include beans, bran cereals and whole-grain breads, peas, dried fruit (figs, apricots, and dates), raspberries, blackberries, strawberries, sweet corn, broccoli, baked potatoes with skin, plums, pears, apples, greens, and nuts. Activity Slowly increase your activity. Be sure to get up and walk every hour or so to prevent blood clots. No heavy lifting or strenuous activity for 4 weeks following surgery to prevent hernias at your incision sites It is normal to feel tired. You may need more sleep than usual.  Get your rest but make sure to get up and move around frequently to prevent blood clots and pneumonia.  Work and Return to School You can go back to work when you feel well enough. Discuss the timing with your surgeon. You can usually go back to school or work 1 week or less after an operation for an unruptured appendix and up to 2 weeks after a ruptured appendix. If your work requires heavy lifting or strenuous activity you need to be placed on light duty for 4 weeks following surgery. You can return to gym class, sports or other physical activities 4 weeks after surgery.  Wound Care Always wash your hands before and after touching near your incision site. Do not soak in a bathtub until cleared at your follow up appointment. You may take a shower 24 hours after surgery. A small amount of drainage from the incision is normal. If the drainage is thick and yellow or  the site is red, you may have an infection, so call your surgeon. If you have a drain in one of your incisions, it will be taken out in office when the drainage stops. Steri-Strips will fall off in 7 to 10 days or they will be removed during your first office visit. If you have dermabond glue covering over the incision, allow the glue to flake off on its own. Avoid wearing tight or rough clothing. It may rub your incisions and make it harder for them to heal. Protect the new skin, especially from the sun. The sun can burn and cause darker scarring. Your scar will heal in about 4 to 6 weeks and will become softer and continue to fade over the next year.  The cosmetic appearance of the incisions will improve over the course of the first year after surgery. Sensation around your incision will return in a few weeks or months.  Bowel Movements After intestinal surgery, you may have loose watery stools for several days. If watery diarrhea lasts longer than 3 days, contact your surgeon. Pain medication (narcotics) can cause constipation. Increase the fiber in your diet with high-fiber foods if you are constipated. You can take an over the counter stool softener like Colace to avoid constipation.  Additional over the counter medications can also be used if Colace isn't sufficient (for example, Milk of Magnesia or Miralax).  Pain The amount of pain is different for each person. Some people need only 1 to   3 doses of pain control medication, while others need more. Take alternating doses of tylenol and ibuprofen around the clock for the first five days following surgery.  This will provide a baseline of pain control and help with inflammation.  Take the narcotic pain medication in addition if needed for severe pain.  Contact Your Surgeon at [phone], if you have: Pain that will not go away Pain that gets worse A fever of more than 101F (38.3C) Repeated vomiting Swelling, redness, bleeding, or  bad-smelling drainage from your wound site Strong abdominal pain No bowel movement or unable to pass gas for 3 days Watery diarrhea lasting longer than 3 days  Pain Control The goal of pain control is to minimize pain, keep you moving and help you heal. Your surgical team will work with you on your pain plan. Most often a combination of therapies and medications are used to control your pain. You may also be given medication (local anesthetic) at the surgical site. This may help control your pain for several days. Extreme pain puts extra stress on your body at a time when your body needs to focus on healing. Do not wait until your pain has reached a level "10" or is unbearable before telling your doctor or nurse. It is much easier to control pain before it becomes severe. Following a laparoscopic procedure, pain is sometimes felt in the shoulder. This is due to the gas inserted into your abdomen during the procedure. Moving and walking helps to decrease the gas and the right shoulder pain.  Use the guide below for ways to manage your post-operative pain. Learn more by going to facs.org/safepaincontrol.  How Intense Is My Pain Common Therapies to Feel Better       I hardly notice my pain, and it does not interfere with my activities.  I notice my pain and it distracts me, but I can still do activities (sitting up, walking, standing).  Non-Medication Therapies  Ice (in a bag, applied over clothing at the surgical site), elevation, rest, meditation, massage, distraction (music, TV, play) walking and mild exercise Splinting the abdomen with pillows +  Non-Opioid Medications Acetaminophen (Tylenol) Non-steroidal anti-inflammatory drugs (NSAIDS) Aspirin, Ibuprofen (Motrin, Advil) Naproxen (Aleve) Take these as needed, when you feel pain. Both acetaminophen and NSAIDs help to decrease pain and swelling (inflammation).      My pain is hard to ignore and is more noticeable even when I  rest.  My pain interferes with my usual activities.  Non-Medication Therapies  +  Non-Opioid medications  Take on a regular schedule (around-the-clock) instead of as needed. (For example, Tylenol every 6 hours at 9:00 am, 3:00 pm, 9:00 pm, 3:00 am and Motrin every 6 hours at 12:00 am, 6:00 am, 12:00 pm, 6:00 pm)         I am focused on my pain, and I am not doing my daily activities.  I am groaning in pain, and I cannot sleep. I am unable to do anything.  My pain is as bad as it could be, and nothing else matters.  Non-Medication Therapies  +  Around-the-Clock Non-Opioid Medications  +  Short-acting opioids  Opioids should be used with other medications to manage severe pain. Opioids block pain and give a feeling of euphoria (feel high). Addiction, a serious side effect of opioids, is rare with short-term (a few days) use.  Examples of short-acting opioids include: Tramadol (Ultram), Hydrocodone (Norco, Vicodin), Hydromorphone (Dilaudid), Oxycodone (Oxycontin)     The above   directions have been adapted from the [employer] Surgical Patient Education Program.  Please refer to the ACS website if needed: https://www.facs.org/education/patient-education/patient-resources/operations/appendectomy.   Oma Alpert, MD [facility] [address], [address], [place], [place]  [address] ?  [address] [address], [place], [place]   [address] [phone] ? [phone] ? FAX [phone] Web site: [email or web address]  

## 2023-10-30 ENCOUNTER — Other Ambulatory Visit (HOSPITAL_COMMUNITY): Payer: Self-pay

## 2023-10-30 ENCOUNTER — Inpatient Hospital Stay: Admitting: Internal Medicine

## 2023-10-30 ENCOUNTER — Encounter (HOSPITAL_COMMUNITY): Payer: Self-pay | Admitting: Surgery

## 2023-10-30 DIAGNOSIS — N179 Acute kidney failure, unspecified: Secondary | ICD-10-CM | POA: Diagnosis not present

## 2023-10-30 DIAGNOSIS — I5043 Acute on chronic combined systolic (congestive) and diastolic (congestive) heart failure: Secondary | ICD-10-CM | POA: Diagnosis not present

## 2023-10-30 DIAGNOSIS — K353 Acute appendicitis with localized peritonitis, without perforation or gangrene: Secondary | ICD-10-CM | POA: Diagnosis not present

## 2023-10-30 DIAGNOSIS — J9601 Acute respiratory failure with hypoxia: Secondary | ICD-10-CM

## 2023-10-30 LAB — BASIC METABOLIC PANEL WITH GFR
Anion gap: 13 (ref 5–15)
BUN: 15 mg/dL (ref 8–23)
CO2: 25 mmol/L (ref 22–32)
Calcium: 9.3 mg/dL (ref 8.9–10.3)
Chloride: 97 mmol/L — ABNORMAL LOW (ref 98–111)
Creatinine, Ser: 1.23 mg/dL — ABNORMAL HIGH (ref 0.44–1.00)
GFR, Estimated: 48 mL/min — ABNORMAL LOW (ref >60)
Glucose, Bld: 170 mg/dL — ABNORMAL HIGH (ref 70–99)
Potassium: 4.8 mmol/L (ref 3.5–5.1)
Sodium: 135 mmol/L (ref 135–145)

## 2023-10-30 LAB — CBC
HCT: 39.5 % (ref 36.0–46.0)
Hemoglobin: 11.5 g/dL — ABNORMAL LOW (ref 12.0–15.0)
MCH: 23 pg — ABNORMAL LOW (ref 26.0–34.0)
MCHC: 29.1 g/dL — ABNORMAL LOW (ref 30.0–36.0)
MCV: 79 fL — ABNORMAL LOW (ref 80.0–100.0)
Platelets: 415 K/uL — ABNORMAL HIGH (ref 150–400)
RBC: 5 MIL/uL (ref 3.87–5.11)
RDW: 19.3 % — ABNORMAL HIGH (ref 11.5–15.5)
WBC: 7.8 K/uL (ref 4.0–10.5)
nRBC: 0 % (ref 0.0–0.2)

## 2023-10-30 LAB — PRO BRAIN NATRIURETIC PEPTIDE: Pro Brain Natriuretic Peptide: 4937 pg/mL — ABNORMAL HIGH (ref <300.0)

## 2023-10-30 MED ORDER — HEPARIN SOD (PORK) LOCK FLUSH 100 UNIT/ML IV SOLN
500.0000 [IU] | Freq: Once | INTRAVENOUS | Status: AC
  Administered 2023-10-30: 500 [IU] via INTRAVENOUS
  Filled 2023-10-30: qty 5

## 2023-10-30 MED ORDER — OXYCODONE HCL 5 MG PO TABS
5.0000 mg | ORAL_TABLET | Freq: Four times a day (QID) | ORAL | 0 refills | Status: DC | PRN
  Filled 2023-10-30: qty 20, 5d supply, fill #0

## 2023-10-30 NOTE — Plan of Care (Signed)

## 2023-10-30 NOTE — Plan of Care (Signed)
  Problem: Clinical Measurements: Goal: Will remain free from infection Outcome: Progressing   Problem: Activity: Goal: Risk for activity intolerance will decrease Outcome: Progressing   Problem: Elimination: Goal: Will not experience complications related to bowel motility Outcome: Progressing Goal: Will not experience complications related to urinary retention Outcome: Progressing   Problem: Pain Managment: Goal: General experience of comfort will improve and/or be controlled Outcome: Progressing   Problem: Safety: Goal: Ability to remain free from injury will improve Outcome: Progressing   Problem: Skin Integrity: Goal: Risk for impaired skin integrity will decrease Outcome: Progressing

## 2023-10-30 NOTE — Evaluation (Signed)
 Physical Therapy Evaluation Patient Details Name: Natalie Baker MRN: [id number] DOB: December 13, 1955 Today's Date: 10/30/2023  History of Present Illness  Patient is a 68 y/o female admitted 10/27/23 due to abdominal pain and outpatient CT showed appendicitis.  Patient underwent laparoscopic appendectomy 10/29/23.  PMH positive for mantle cell lymphoma, chemo induced heart failure, CKD, h/o PE/DVT on Eliquis , CKD 3A, hypothyroid.  Clinical Impression  Patient presents with mobility limited with L side incisional pain and mild unsteadiness prefers to use IV pole for support for hallway ambulation.  Able to negotiate steps appropriate for home entry with CGA and noted SpO2 with ambulation 92% (dip to 85% after stair negotiation but up to 91% less than 1 minute with cues for pursed lip breathing).  She was educated in slow progression of activity and energy conservation and to consider shower chair.  Patient stable for home and no follow up PT needs at this time.  Will sign off as pt anticipates home later today.         If plan is discharge home, recommend the following: Assist for transportation;Assistance with cooking/housework;Help with stairs or ramp for entrance   Can travel by private vehicle        Equipment Recommendations None recommended by PT  Recommendations for Other Services       Functional Status Assessment Patient has had a recent decline in their functional status and demonstrates the ability to make significant improvements in function in a reasonable and predictable amount of time.     Precautions / Restrictions Precautions Precautions: Fall Precaution/Restrictions Comments: watch O2      Mobility  Bed Mobility Overal bed mobility: Modified Independent             General bed mobility comments: with elevated HOB (does have recliner she can sleep in if needed)    Transfers Overall transfer level: Needs assistance   Transfers: Sit to/from Stand Sit to  Stand: Supervision           General transfer comment: for safety with IV    Ambulation/Gait Ambulation/Gait assistance: Supervision Gait Distance (Feet): 180 Feet Assistive device: IV Pole Gait Pattern/deviations: Step-through pattern, Decreased stride length       General Gait Details: pushing IV pole in hallway no LOB, SpO2 92% on RA with ambulation  Stairs Stairs: Yes Stairs assistance: Contact guard assist Stair Management: One rail Left, Alternating pattern, Step to pattern, Forwards Number of Stairs: 3 General stair comments: using rail and initially min A though pt reports can do without HHA so CGA for safety and self selected pattern step to versus step through  Wheelchair Mobility     Tilt Bed    Modified Rankin (Stroke Patients Only)       Balance Overall balance assessment: Needs assistance   Sitting balance-Leahy Scale: Good       Standing balance-Leahy Scale: Good Standing balance comment: static standing no UE support to don gown                             Pertinent Vitals/Pain Pain Assessment Pain Assessment: 0-10 Pain Score: 8  Pain Location: incision on L side Pain Descriptors / Indicators: Operative site guarding Pain Intervention(s): Monitored during session    Home Living Family/patient expects to be discharged to:: Private residence Living Arrangements: Spouse/significant other Available Help at Discharge: Family Type of Home: House Home Access: Stairs to enter Entrance Stairs-Rails: Left Entrance Stairs-Number of Steps:  4 in garage; 2 in front no rail   Home Layout: One level Home Equipment: None      Prior Function Prior Level of Function : Driving             Mobility Comments: retired in April, drives limited; denies falls ADLs Comments: usually daughter or spouse help with shower as needed; dresses independent, helps make bed, folds clothes or cooks     Extremity/Trunk Assessment   Upper Extremity  Assessment Upper Extremity Assessment: Overall WFL for tasks assessed    Lower Extremity Assessment Lower Extremity Assessment: Generalized weakness    Cervical / Trunk Assessment Cervical / Trunk Assessment: Kyphotic  Communication   Communication Communication: No apparent difficulties    Cognition Arousal: Alert Behavior During Therapy: WFL for tasks assessed/performed   PT - Cognitive impairments: No apparent impairments                                 Cueing Cueing Techniques: Verbal cues     General Comments General comments (skin integrity, edema, etc.): SpO2 after stair negotiation 85% though improved to 92% less then 1 minute cues for PLB; education on slow progression of activity, watching for SOB and using pursed lip breathing when very active; energy conservation and to consider shower chair    Exercises     Assessment/Plan    PT Assessment Patient does not need any further PT services  PT Problem List         PT Treatment Interventions      PT Goals (Current goals can be found in the Care Plan section)  Acute Rehab PT Goals PT Goal Formulation: All assessment and education complete, DC therapy    Frequency       Co-evaluation               AM-PAC PT 6 Clicks Mobility  Outcome Measure Help needed turning from your back to your side while in a flat bed without using bedrails?: None Help needed moving from lying on your back to sitting on the side of a flat bed without using bedrails?: None Help needed moving to and from a bed to a chair (including a wheelchair)?: None Help needed standing up from a chair using your arms (e.g., wheelchair or bedside chair)?: None Help needed to walk in hospital room?: None Help needed climbing 3-5 steps with a railing? : A Little 6 Click Score: 23    End of Session   Activity Tolerance: Patient tolerated treatment well Patient left: in bed;with call bell/phone within reach Nurse  Communication: Patient requests pain meds PT Visit Diagnosis: Muscle weakness (generalized) (M62.81)    Time: 8966-8942 PT Time Calculation (min) (ACUTE ONLY): 24 min   Charges:   PT Evaluation $PT Eval Low Complexity: 1 Low PT Treatments $Self Care/Home Management: 8-22 PT General Charges $$ ACUTE PT VISIT: 1 Visit         Micheline Portal, PT Acute Rehabilitation Services Office:[phone] 10/30/2023   Montie Portal 10/30/2023, 11:01 AM

## 2023-10-30 NOTE — Progress Notes (Signed)
 1 Day Post-Op  Subjective: Doing well.  Pain around larger incision  Objective: Vital signs in last 24 hours: Temp:  [97.4 F (36.3 C)-98.5 F (36.9 C)] 97.5 F (36.4 C) (10/23 0552) Pulse Rate:  [69-75] 72 (10/23 0552) Resp:  [11-17] 14 (10/23 0552) BP: (85-98)/(48-58) 91/54 (10/23 0552) SpO2:  [92 %-99 %] 92 % (10/23 0552) Last BM Date : 10/27/23  Intake/Output from previous day: 10/22 0701 - 10/23 0700 In: 2290 [P.O.:240; I.V.:1200; IV Piggyback:850] Out: -  Intake/Output this shift: No intake/output data recorded.  PE: Gen:  Alert, NAD, pleasant Card:  RRR Pulm:  Rate and effort normal on o2 Abd: Incisions c/d/I w/ glue Psych: A&Ox3   Lab Results:  Recent Labs    10/28/23 0553 10/29/23 0500  WBC 4.2 3.7*  HGB 12.2 11.7*  HCT 40.2 39.6  PLT 425* 413*   BMET Recent Labs    10/28/23 0553 10/29/23 1622  NA 136 134*  K 3.9 4.5  CL 96* 93*  CO2 28 25  GLUCOSE 92 247*  BUN 16 13  CREATININE 1.26* 1.46*  CALCIUM  9.0 8.7*   PT/INR No results for input(s): LABPROT, INR in the last 72 hours. CMP     Component Value Date/Time   NA 134 (L) 10/29/2023 1622   K 4.5 10/29/2023 1622   CL 93 (L) 10/29/2023 1622   CO2 25 10/29/2023 1622   GLUCOSE 247 (H) 10/29/2023 1622   BUN 13 10/29/2023 1622   CREATININE 1.46 (H) 10/29/2023 1622   CREATININE 1.42 (H) 10/23/2023 1423   CREATININE 1.06 (H) 12/13/2022 0956   CALCIUM  8.7 (L) 10/29/2023 1622   PROT 5.4 (L) 10/29/2023 1622   ALBUMIN 3.8 10/29/2023 1622   AST 28 10/29/2023 1622   AST 13 (L) 10/23/2023 1423   ALT 10 10/29/2023 1622   ALT 9 10/23/2023 1423   ALKPHOS 131 (H) 10/29/2023 1622   BILITOT 0.3 10/29/2023 1622   BILITOT 0.6 10/23/2023 1423   GFRNONAA 39 (L) 10/29/2023 1622   GFRNONAA 41 (L) 10/23/2023 1423   GFRNONAA 86 12/21/2018 1513   GFRAA >60 09/30/2019 1128   GFRAA >60 06/02/2019 1430   GFRAA 100 12/21/2018 1513   Lipase     Component Value Date/Time   LIPASE 27 10/27/2023  1434    Studies/Results: No results found.   Anti-infectives: Anti-infectives (From admission, onward)    Start     Dose/Rate Route Frequency Ordered Stop   10/28/23 1400  cefTRIAXone  (ROCEPHIN ) 2 g in sodium chloride  0.9 % 100 mL IVPB  Status:  Discontinued        2 g 200 mL/hr over 30 Minutes Intravenous Every 24 hours 10/28/23 0651 10/28/23 0655   10/28/23 0800  metroNIDAZOLE (FLAGYL) IVPB 500 mg        500 mg 100 mL/hr over 60 Minutes Intravenous Every 12 hours 10/28/23 0651     10/28/23 0800  ciprofloxacin (CIPRO) IVPB 400 mg        400 mg 200 mL/hr over 60 Minutes Intravenous Every 12 hours 10/28/23 0652     10/27/23 1515  metroNIDAZOLE (FLAGYL) IVPB 500 mg        500 mg 100 mL/hr over 60 Minutes Intravenous  Once 10/27/23 1503 10/27/23 1632   10/27/23 1515  cefTRIAXone  (ROCEPHIN ) 1 g in sodium chloride  0.9 % 100 mL IVPB        1 g 200 mL/hr over 30 Minutes Intravenous  Once 10/27/23 1503 10/27/23 1601  Assessment/Plan Acute Appendicitis - Doing well s/p lap appy 10/22 - Okay for discharge  FEN - Reg diet VTE - Okay for anticoagulation ID - None needed at this point  - Per [facility] -  Hx CHF, NICM with acute CHF exacerbation on o2 and getting lasix  - per Cards and [facility] Moderate MVR Hx of Hypotension Hx VT/PVC Hx PE/DVT on Eliquis  HLD CKD Hx mantle cell lymphoma s/p CAR-T therapy   I reviewed nursing notes, Consultant (Cardiology) notes, hospitalist notes, last 24 h vitals and pain scores, last 48 h intake and output, last 24 h labs and trends, and last 24 h imaging results.    LOS: 3 days    Deward JINNY Foy, MD [facility] 10/30/2023, 9:48 AM Please see Amion for pager number during day hours 7:00am-4:30pm

## 2023-10-30 NOTE — Progress Notes (Signed)
  Progress Note  Patient Name: Natalie Baker Date of Encounter: 10/30/2023 [facility] Cardiologist: None   Interval Summary   Underwent lap appy 10/22.  Received torsemide /potassium but no I/o charted.  Cr mildly elevated today, hypotensive, will hold further. K normal 4.5 will hold further.   Vital Signs Vitals:   10/29/23 1235 10/29/23 1408 10/29/23 2100 10/30/23 0552  BP: (!) 91/54 (!) 90/50 (!) 98/58 (!) 91/54  Pulse: 71 72 71 72  Resp: 17 17 14 14   Temp: 98 F (36.7 C) (!) 97.4 F (36.3 C) 97.7 F (36.5 C) (!) 97.5 F (36.4 C)  TempSrc: Oral Oral Oral Oral  SpO2: 99% 97% 97% 92%  Weight:      Height:        Intake/Output Summary (Last 24 hours) at 10/30/2023 0946 Last data filed at 10/30/2023 9682 Gross per 24 hour  Intake 2290 ml  Output --  Net 2290 ml      10/29/2023    8:06 AM 10/28/2023    4:02 PM 10/28/2023    9:41 AM  Last 3 Weights  Weight (lbs) 176 lb 5.9 oz 176 lb 5.9 oz 176 lb 9.4 oz  Weight (kg) 80 kg 80 kg 80.1 kg      Telemetry/ECG  SR - Personally Reviewed  Physical Exam  GEN: No acute distress.   Neck: No JVD Cardiac: RRR Respiratory: Clear to auscultation bilaterally. GI: Soft, nontender, non-distended  MS: No edema  Assessment & Plan   Acute appendicitis s/p laparoscopic appendectomy 10/22.  - stable.    Acute on chronic systolic and diastolic heart failure, nonischemic cardiomyopathy felt secondary to chemotherapy -Some evidence on imaging and labs of volume overload, but patient overall at her baseline symptoms.  She received 1 dose of IV Lasix  20 mg, with expected response.  Patient states she does well on her home torsemide , got 2 doses in last 3 days, cr mildly elevated today, just monitor, received this am. Torsemide /potassium held, resume in 2-3 days.   -Okay to continue ivabradine  7.5 mg twice daily - spironolactone , and Cozaar  on hold - hold until follow up. Pt has been hypotensive lately, she wants to  review this with [facility] service. - resume farxiga  and torsemide /potassium in 2-3 days. Needs labs within 1 week with PCP. BMET.   History of ventricular tachycardia and PVCs -Continue amiodarone  100 mg daily   Stable for hospital dc from CV perspective, d/w Primary team.    For questions or updates, please contact [facility] Please consult [email or web address] for contact info under         Signed, Anquanette Bahner A Manar Smalling, MD

## 2023-10-30 NOTE — Discharge Summary (Signed)
 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 The patient had an outpatient CT scan of the abdomen pelvis ordered by her oncologist Dr. Gatha 4 days ago.  The patient was called and told to come to the ER when the CT showed appendicitis. The patient reported right lower quadrant abdominal pain for 2 weeks.  She had nausea vomiting, fevers and chills. She also reported some cough and mild increased sob w exertion.   Assessment and Plan:  Acute appendicitis - CT abdomen pelvis showed acute appendicitis. - Patient was placed on IV antibiotics and general surgery was consulted. - Eliquis  held, patient underwent laparoscopic appendectomy on 10/29/2023 -Postop tolerating diet, no acute issues. - She is cleared to be discharged home by cardiology and general surgery - Eliquis  resumed upon discharge. - Per surgery no further need for antibiotics   Acute systolic and diastolic CHF exacerbation, nonischemic cardiomyopathy felt secondary to chemotherapy - Some  evidence of volume overload with labs and imaging, was placed on 2 L O2 via [place] due to hypoxemia - Received IV Lasix  20 mg x 1 and started on torsemide  - Ivabradine  continued - Creatinine mildly trended up to 1.46, cardiology recommended to hold Farxiga , losartan  and spironolactone  -Resume torsemide  in 2 days    Acute on chronic renal failure superimposed on CKD Stage 3a  - Baseline creatinine around 1.43, creatinine 1.73 on 10/20 on admission Losartan , spironolactone  held     Chronic mantle cell lymphoma - No cure per patient report, chronic low level. -Continue to follow with oncology    Hypothyroidism - Continue Synthroid .      Pain control - [place]  Controlled Substance Reporting System database was reviewed. and patient was instructed, not to drive, operate heavy machinery, perform activities at heights, swimming or participation in water activities or provide baby-sitting services while on Pain, Sleep and Anxiety Medications; until their outpatient Physician has advised to do so again. Also recommended to not to take more than prescribed Pain, Sleep and Anxiety Medications.  Consultants: Cardiology, general surgery Procedures performed: Laparoscopic appendectomy Disposition: Home Diet recommendation:  Discharge Diet Orders (From admission, onward)     Start     Ordered   10/30/23 0000  Diet - low sodium heart healthy        10/30/23 1304            DISCHARGE MEDICATION: Allergies as of 10/30/2023       Reactions   Adriamycin  [doxorubicin ] Other (See Comments)   Acute heart failure (chemo from Jan - May 2021)   Ppd [tuberculin Purified Protein Derivative] Swelling   Positive  reaction.  Negative Chest Xray 06/16/12        Medication List     PAUSE taking these medications    Farxiga  10 MG Tabs tablet Wait to take this until: November 02, 2023 Generic drug: dapagliflozin  propanediol TAKE 1 TABLET BY MOUTH EVERY DAY   losartan  25 MG tablet Wait to  take this until your doctor or other care provider tells you to start again. Commonly known as: COZAAR  Take 1 tablet (25 mg total) by mouth daily. What changed:  how much to take when to take this   potassium chloride  10 MEQ tablet Wait to take this until: November 02, 2023 Commonly known as: KLOR-CON  2 tabs po qd What changed:  how much to take how to take this when to take this additional instructions   spironolactone  25 MG tablet Wait to take this until your doctor or other care provider tells you to start again. Commonly known as: ALDACTONE  TAKE 1 TABLET BY MOUTH EVERY DAY What changed: how much to take   torsemide  20 MG tablet Wait to take this until: November 02, 2023 Commonly known as: DEMADEX  Take 1 tablet (20 mg total) by mouth daily. What changed: when to take this       TAKE these medications    amiodarone  200 MG tablet Commonly known as: PACERONE  TAKE 1/2 TABLET BY MOUTH DAILY   Eliquis  5 MG Tabs tablet Generic drug: apixaban  TAKE 1 TABLET BY MOUTH TWICE A DAY   estradiol  0.0375 mg/24hr patch Commonly known as: CLIMARA  - Dosed in mg/24 hr Place 0.0375 mg onto the skin 2 (two) times a week. Tuesdays & Fridays   ivabradine  7.5 MG Tabs tablet Commonly known as: CORLANOR  Take 1 tablet (7.5 mg total) by mouth 2 (two) times daily with a meal.   levothyroxine  100 MCG tablet Commonly known as: SYNTHROID  TAKE 1 TABLET BY MOUTH DAILY ON AN EMPTY STOMACH WITH ONLY WATER FOR 30 MINUTES & NO ANTACID/CALCIUM /MAGNESIUM  FOR 4 HOURS & AVOID BIOTIN   ondansetron  4 MG disintegrating tablet Commonly known as: ZOFRAN -ODT Take 1 tablet (4 mg total) by mouth every 8 (eight) hours as needed for nausea or vomiting.   oxyCODONE  5 MG immediate release tablet Commonly known as: Oxy IR/ROXICODONE  Take 1 tablet (5 mg total) by mouth every 6 (six) hours as needed for moderate pain (pain score 4-6) or severe pain (pain score 7-10).   prochlorperazine  10 MG tablet Commonly  known as: COMPAZINE  TAKE 1 TABLET BY MOUTH EVERY 6 HOURS AS NEEDED FOR NAUSEA OR VOMITING.   temazepam  30 MG capsule Commonly known as: RESTORIL  Take 1 capsule (30 mg total) by mouth at bedtime as needed for sleep.        Follow-up Information     Maczis, Puja Gosai, PA-C Follow up on 11/18/2023.   Specialty: General Surgery Why: 10am. Please bring a copy of your photo ID, insurance card and arrive 30 minutes prior to your appointment for paperwork. Contact information: [address] [place] [address] [phone]         [name], MD. Schedule an appointment as soon as possible for a visit in 1 week(s).   Specialty: Family Medicine Why: for hospital follow-up, obtain labs (BMET for kidney function) Contact information: [address] [place] [place] [address] [phone]         Bensimhon, Toribio SAUNDERS, MD. Schedule an appointment as soon as possible for a visit in 10 day(s).   Specialty:  Cardiology Why: for hospital follow-up Contact information: [address] [place] [address] [phone]                Discharge Exam: Natalie Baker   10/28/23 0941 10/28/23 1602 10/29/23 0806  Weight: 80.1 kg 80 kg 80 kg   S: Feels better, no acute chest pain, shortness of breath abdominal pain nausea vomiting.  Cleared by cardiology and surgery to discharge home  BP (!) 91/54 (BP Location: Right Arm)   Pulse 72   Temp (!) 97.5 F (36.4 C) (Oral)   Resp 14   Ht 5' 2 (1.575 m)   Wt 80 kg   SpO2 92%   BMI 32.26 kg/m    Physical Exam General: Alert and oriented x 3, NAD Cardiovascular: S1 S2 clear, RRR.  Respiratory: CTAB, no wheezing, rales or rhonchi Gastrointestinal: Soft,  nondistended, NBS, incision CDI Ext: no pedal edema bilaterally Neuro: no new deficits Psych: Normal affect    Condition at discharge: fair  The results of significant diagnostics from this hospitalization (including imaging, microbiology,  ancillary and laboratory) are listed below for reference.   Imaging Studies: CT ABDOMEN PELVIS W CONTRAST Result Date: 10/27/2023 EXAM: CT ABDOMEN AND PELVIS WITH CONTRAST 10/27/2023 08:09:10 PM TECHNIQUE: CT of the abdomen and pelvis was performed with the administration of intravenous contrast. Multiplanar reformatted images are provided for review. Automated exposure control, iterative reconstruction, and/or weight-based adjustment of the mA/kV was utilized to reduce the radiation dose to as low as reasonably achievable. 80 mL of iohexol  (OMNIPAQUE ) 300 MG/ML solution was administered intravenously. COMPARISON: 10/23/2023 CLINICAL HISTORY: with PO contrast, recent diagnosis appendicitis. Per chart: Pt reports with RLQ pain x 1 week. Pt reports having a ct and it showed appendicitis. FINDINGS: LOWER CHEST: No acute abnormality. LIVER: Hepatic steatosis. GALLBLADDER AND BILE DUCTS: Gallbladder is unremarkable. No biliary ductal dilatation. SPLEEN: No acute abnormality. PANCREAS: No acute abnormality. ADRENAL GLANDS: No acute abnormality. KIDNEYS, URETERS AND BLADDER: No stones in the kidneys or ureters. No hydronephrosis. No perinephric or periureteral stranding. Urinary bladder is unremarkable. GI AND BOWEL: Stomach demonstrates no acute abnormality. Unchanged in appearance of the appendix which is mildly dilated measuring up to 9 mm with mural thickening and hyper-enhancement. The degree of adjacent stranding and fluid appears decreased compared to 10/23/2023. There is no bowel obstruction. PERITONEUM AND RETROPERITONEUM: No ascites. No free air. VASCULATURE: Aorta is normal in caliber. Aortic atherosclerotic calcification. LYMPH NODES: No lymphadenopathy. REPRODUCTIVE ORGANS: No acute abnormality. BONES AND SOFT TISSUES: No acute osseous abnormality. No focal soft tissue abnormality. IMPRESSION: 1. Dilated and inflamed appendix similar to 10 / 16 / 25. The degree of adjacent inflammatory stranding has  decreased. Electronically signed by: Norman Gatlin MD 10/27/2023 08:21 PM EDT RP Workstation: [id number]   DG Chest Portable 1 View Result Date: 10/27/2023 CLINICAL DATA:  Follow-up ground-glass opacities on recent CT EXAM: PORTABLE CHEST 1 VIEW COMPARISON:  05/21/2023 FINDINGS: Cardiomegaly. Right chest port catheter. Both lungs are clear. The visualized skeletal structures are unremarkable. IMPRESSION: Cardiomegaly without acute abnormality of the lungs on frontal chest radiograph. No focal airspace opacity. Electronically Signed   By: Marolyn JONETTA Jaksch M.D.   On: 10/27/2023 16:39   CT CHEST ABDOMEN PELVIS W CONTRAST Result Date: 10/27/2023 CLINICAL DATA:  Hematologic malignancy, mantle cell lymphoma, follow-up. * Tracking Code: BO * EXAM: CT CHEST, ABDOMEN, AND PELVIS WITH CONTRAST TECHNIQUE: Multidetector CT imaging of the chest, abdomen and pelvis was performed following the standard protocol during  bolus administration of intravenous contrast. RADIATION DOSE REDUCTION: This exam was performed according to the departmental dose-optimization program which includes automated exposure control, adjustment of the mA and/or kV according to patient size and/or use of iterative reconstruction technique. CONTRAST:  OMNIPAQUE  IOHEXOL  300 MG/ML  SOLN COMPARISON:  Multiple priors including CT April 10, 2023 FINDINGS: CT CHEST FINDINGS Cardiovascular: Accessed right chest Port-A-Cath with tip in the right atrium. Aortic atherosclerosis. Normal size heart. No significant pericardial effusion/thickening. Mediastinum/Nodes: No suspicious thyroid  nodule. No pathologically enlarged mediastinal, hilar or axillary lymph nodes. The esophagus is grossly unremarkable. Lungs/Pleura: Interlobular septal thickening with interposed ground-glass throughout both lungs. Previously seen patchy nodular consolidations are largely resolved on today's examination. Scattered tiny pulmonary nodules for instance in the right upper lobe  measuring 2 mm on image 31/6 and in the subpleural anterior left upper lobe measuring 2 mm on image 34/6. Musculoskeletal: No aggressive lytic or blastic lesion of bone. CT ABDOMEN PELVIS FINDINGS Hepatobiliary: Hyperenhancing nodular segment III hepatic focus measuring 7 mm on image 49/2 is stable dating back to 2022 compatible with a benign finding. No suspicious hepatic lesion. Gallbladder surgically absent. No biliary ductal dilation. Pancreas: No pancreatic ductal dilation or evidence of acute inflammation. Spleen: No splenomegaly. Adrenals/Urinary Tract: Bilateral lobular adrenal thickening without discrete nodularity favor hyperplasia. No hydronephrosis. Kidneys demonstrate symmetric enhancement. Urinary bladder is unremarkable for degree of distension. Stomach/Bowel: Radiopaque enteric contrast material reaches the splenic flexure. Stomach is nondistended. No pathologic dilation of small or large bowel. Dilated fluid-filled appendix with mural hyperenhancement and wall thickening with adjacent stranding measures up to 7 mm in diameter on image 57/4. Vascular/Lymphatic: Aortic atherosclerosis. Normal caliber abdominal aorta. Smooth IVC contours. The portal, splenic and superior mesenteric veins are patent. No pathologically enlarged abdominal or pelvic lymph nodes. Reproductive: Status post hysterectomy. No adnexal masses. Other: No walled-off fluid collection.  No pneumoperitoneum. Musculoskeletal: No aggressive lytic or blastic lesion of bone. Mild spondylosis. IMPRESSION: 1. No pathologically enlarged lymph nodes in the chest, abdomen or pelvis. 2. Dilated fluid-filled appendix with mural hyperenhancement and wall thickening with adjacent stranding measures up to 7 mm in diameter, concerning for acute appendicitis. 3. Interlobular septal thickening with interposed ground-glass throughout both lungs, may reflect pulmonary edema or an infectious/inflammatory etiology. 4. Previously seen patchy nodular  pulmonary consolidations are largely resolved on today's examination. 5. Scattered tiny pulmonary nodules measuring up to 2 mm, nonspecific but favored infectious or inflammatory. These results will be called to the ordering clinician or representative by the Radiologist Assistant, and communication documented in the PACS or [employer]. Electronically Signed   By: Reyes Holder M.D.   On: 10/27/2023 03:47    Microbiology: Results for orders placed or performed during the hospital encounter of 10/27/23  Surgical pcr screen     Status: None   Collection Time: 10/28/23  7:41 PM   Specimen: Nasal Mucosa; Nasal Swab  Result Value Ref Range Status   MRSA, PCR NEGATIVE NEGATIVE Final   Staphylococcus aureus NEGATIVE NEGATIVE Final    Comment: (NOTE) The Xpert SA Assay (FDA approved for NASAL specimens in patients 45 years of age and older), is one component of a comprehensive surveillance program. It is not intended to diagnose infection nor to guide or monitor treatment. Performed at [facility], [address] [place], [place] [address]     Labs: CBC: Recent Labs  Lab 10/23/23 1423 10/27/23 1434 10/28/23 0553 10/29/23 0500 10/30/23 1224  WBC 5.4 4.3 4.2 3.7*  7.8  NEUTROABS 4.2 3.2  --   --   --   HGB 12.7 13.7 12.2 11.7* 11.5*  HCT 39.9 46.8* 40.2 39.6 39.5  MCV 75.3* 77.9* 78.1* 77.6* 79.0*  PLT 491* 492* 425* 413* 415*   Basic Metabolic Panel: Recent Labs  Lab 10/23/23 1423 10/27/23 1434 10/28/23 0553 10/29/23 1622 10/30/23 1224  NA 135 136 136 134* 135  K 3.4* 3.8 3.9 4.5 4.8  CL 93* 96* 96* 93* 97*  CO2 33* 28 28 25 25   GLUCOSE 83 87 92 247* 170*  BUN 17 16 16 13 15   CREATININE 1.42* 1.73* 1.26* 1.46* 1.23*  CALCIUM  9.3 9.6 9.0 8.7* 9.3  MG  --   --  2.3 1.9  --    Liver Function Tests: Recent Labs  Lab 10/23/23 1423 10/27/23 1434 10/29/23 1622  AST 13* 21 28  ALT 9 8 10   ALKPHOS 129* 154* 131*  BILITOT 0.6 0.5 0.3  PROT 6.1*  6.4* 5.4*  ALBUMIN 3.5 3.7 3.8   CBG: No results for input(s): GLUCAP in the last 168 hours.  Discharge time spent: greater than 30 minutes.  Signed: Nydia Distance, MD [facility] 10/30/2023

## 2023-10-31 ENCOUNTER — Telehealth: Payer: Self-pay

## 2023-10-31 ENCOUNTER — Inpatient Hospital Stay

## 2023-10-31 LAB — SURGICAL PATHOLOGY

## 2023-10-31 NOTE — Transitions of Care (Post Inpatient/ED Visit) (Signed)
   10/31/2023  Name: Natalie Baker MRN: [id number] DOB: January 28, 1955  Today's TOC FU Call Status: Today's TOC FU Call Status:: Unsuccessful Call (1st Attempt) Unsuccessful Call (1st Attempt) Date: 10/31/23  Attempted to reach the patient regarding the most recent Inpatient/ED visit.  Follow Up Plan: Additional outreach attempts will be made to reach the patient to complete the Transitions of Care (Post Inpatient/ED visit) call.   Alan Ee, RN, BSN, CEN [employer]- Transition of Care Team.  [facility] [phone]

## 2023-10-31 NOTE — Transitions of Care (Post Inpatient/ED Visit) (Signed)
 10/31/2023  Name: Natalie Baker MRN: [id number] DOB: Oct 24, 1955  Today's TOC FU Call Status: Today's TOC FU Call Status:: Successful TOC FU Call Completed TOC FU Call Complete Date: 10/31/23 Patient's Name and Date of Birth confirmed.  Transition Care Management Follow-up Telephone Call How have you been since you were released from the hospital?: Better Any questions or concerns?: No  Items Reviewed: Did you receive and understand the discharge instructions provided?: Yes Medications obtained,verified, and reconciled?: Yes (Medications Reviewed) Any new allergies since your discharge?: No Dietary orders reviewed?: Yes Type of Diet Ordered:: low salt heart diet Do you have support at home?: Yes People in Home [RPT]: child(ren), adult Name of Support/Comfort Primary Source: daughter  Medications Reviewed Today: Medications Reviewed Today     Reviewed by Rumalda Alan PENNER, RN (Registered Nurse) on 10/31/23 at 1437  Med List Status: <None>   Medication Order Taking? Sig Documenting Provider Last Dose Status Informant  amiodarone  (PACERONE ) 200 MG tablet [id number] Yes TAKE 1/2 TABLET BY MOUTH DAILY Bensimhon, Daniel R, MD  Active Self, Pharmacy Records  ELIQUIS  5 MG TABS tablet [id number] Yes TAKE 1 TABLET BY MOUTH TWICE A DAY Bensimhon, Toribio SAUNDERS, MD  Active Self, Pharmacy Records  estradiol  (CLIMARA  - DOSED IN MG/24 HR) 0.0375 mg/24hr patch [id number] Yes Place 0.0375 mg onto the skin 2 (two) times a week. Tuesdays & Fridays [provider]  Active Self, Pharmacy Records           Med Note RANELLE, KEENE HERO   Fri May 04, 2021  9:49 AM)    FARXIGA  10 MG TABS tablet [id number]  TAKE 1 TABLET BY MOUTH EVERY DAY  Patient not taking: Reported on 10/31/2023   Bensimhon, Toribio SAUNDERS, MD  Active Self, Pharmacy Records  ivabradine  (CORLANOR ) 7.5 MG TABS tablet [id number] Yes Take 1 tablet (7.5 mg total) by mouth 2 (two) times daily with a meal. Bensimhon, Toribio SAUNDERS, MD  Active Self,  Pharmacy Records  levothyroxine  (SYNTHROID ) 100 MCG tablet [id number] Yes TAKE 1 TABLET BY MOUTH DAILY ON AN EMPTY STOMACH WITH ONLY WATER FOR 30 MINUTES & NO ANTACID/CALCIUM /MAGNESIUM  FOR 4 HOURS & AVOID BIOTIN McGowen, Aleene DEL, MD  Active Self, Pharmacy Records  losartan  (COZAAR ) 25 MG tablet [id number]  Take 1 tablet (25 mg total) by mouth daily.  Patient not taking: Reported on 10/31/2023   Bensimhon, Toribio SAUNDERS, MD  Active Self, Pharmacy Records           Med Note DELETA DEBBY SAUNDERS Pablo Oct 27, 2023  5:26 PM) Recently increased to 25mg  daily but made her feel bad and cause insomnia.  ondansetron  (ZOFRAN -ODT) 4 MG disintegrating tablet [id number] Yes Take 1 tablet (4 mg total) by mouth every 8 (eight) hours as needed for nausea or vomiting. Sherrod Sherrod, MD  Active Self, Pharmacy Records  oxyCODONE  (OXY IR/ROXICODONE ) 5 MG immediate release tablet [id number] Yes Take 1 tablet (5 mg total) by mouth every 6 (six) hours as needed for moderate pain (pain score 4-6) or severe pain (pain score 7-10). Rai, Nydia POUR, MD  Active   potassium chloride  (KLOR-CON ) 10 MEQ tablet [id number]  2 tabs po qd  Patient not taking: Reported on 10/31/2023   McGowen, Philip H, MD  Active Self, Pharmacy Records  prochlorperazine  (COMPAZINE ) 10 MG tablet [id number]  TAKE 1 TABLET BY MOUTH EVERY 6 HOURS AS NEEDED FOR NAUSEA OR VOMITING.  Patient not taking: Reported on 10/31/2023   Sherrod Sherrod, MD  Active Self, Pharmacy  Records  spironolactone  (ALDACTONE ) 25 MG tablet [id number]  TAKE 1 TABLET BY MOUTH EVERY DAY  Patient not taking: Reported on 10/31/2023   Bensimhon, Daniel R, MD  Active Self, Pharmacy Records  temazepam  (RESTORIL ) 30 MG capsule [id number] Yes Take 1 capsule (30 mg total) by mouth at bedtime as needed for sleep. Sherrod Sherrod, MD  Active Self, Pharmacy Records  torsemide  (DEMADEX ) 20 MG tablet [id number]  Take 1 tablet (20 mg total) by mouth daily.  Patient not taking: Reported on 10/31/2023    McGowen, Philip H, MD  Active Self, Pharmacy Records  Med List Note Carmella Asberry FALCON, [facility] 06/15/20 1353): [place]  filled at [facility] and Equipment/Supplies: Were Home Health Services Ordered?: No Any new equipment or medical supplies ordered?: No  Functional Questionnaire: Do you need assistance with bathing/showering or dressing?: No Do you need assistance with meal preparation?: No Do you need assistance with eating?: No Do you have difficulty maintaining continence: No Do you need assistance with getting out of bed/getting out of a chair/moving?: No Do you have difficulty managing or taking your medications?: No  Follow up appointments reviewed: PCP Follow-up appointment confirmed?: No (will call today and make and appoinment) MD Provider Line Number:[phone] Given: No Specialist Hospital Follow-up appointment confirmed?: Yes Date of Specialist follow-up appointment?: 11/11/23 Follow-Up Specialty Provider:: Bensimhon Do you need transportation to your follow-up appointment?: No Do you understand care options if your condition(s) worsen?: Yes-patient verbalized understanding  SDOH Interventions Today    Flowsheet Row Most Recent Value  SDOH Interventions   Food Insecurity Interventions Intervention Not Indicated  Housing Interventions Intervention Not Indicated  Transportation Interventions Intervention Not Indicated  Utilities Interventions Intervention Not Indicated    Today's Vitals   10/31/23 1440 10/31/23 1442  Weight: 177 lb (80.3 kg) 177 lb (80.3 kg)  PainSc:  4     Patient reports that she is feeling better.  Reviewed all discharge instructions and follow up appointment.   Reviewed 30 day TOC program and patient is enrolled.    Goals Addressed             This Visit's Progress    VBCI Transitions of Care (TOC) Care Plan       Problems:  Recent Hospitalization for treatment of appendectomy CHF   Goal:  Over the next  30 days, the patient will not experience hospital readmission  Interventions:  Transitions of Care: Doctor Visits  - discussed the importance of doctor visits Post discharge activity limitations prescribed by provider reviewed Post-op wound/incision care reviewed with patient/caregiver Reviewed Signs and symptoms of infection Reviewed and offered 30 day TOC program and patient has consented. Provided contact number for case manager Next transition of care call scheduled This note sent to PCP Heart Failure Interventions: Basic overview and discussion of pathophysiology of Heart Failure reviewed Provided education on low sodium diet Assessed need for readable accurate scales in home Provided education about placing scale on hard, flat surface Advised patient to weigh each morning after emptying bladder Discussed importance of daily weight and advised patient to weigh and record daily Reviewed role of diuretics in prevention of fluid overload and management of heart failure; Discussed the importance of keeping all appointments with provider Screening for signs and symptoms of depression related to chronic disease state  Assessed social determinant of health barriers  Surgery (appendectomy): Evaluation of current treatment plan related to appendectomy surgery assessed patient/caregiver understanding of  surgical procedure   reviewed post-operative instructions with patient/caregiver addressed questions about post - surgical incision care  reviewed medications with patient and addressed questions reviewed scheduled provider appointments with patient( PCP, cardiology and surgeon) confirmed availability of transportation to all appointments yes performed PHQ2-PHQ9 assessment     Patient Self Care Activities:  Attend all scheduled provider appointments Call pharmacy for medication refills 3-7 days in advance of running out of medications Call provider office for new concerns or questions   Notify RN Care Manager of TOC call rescheduling needs Participate in Transition of Care Program/Attend TOC scheduled calls Take medications as prescribed   call office if I gain more than 2 pounds in one day or 5 pounds in one week do ankle pumps when sitting track weight in diary use salt in moderation watch for swelling in feet, ankles and legs every day weigh myself daily Monitor for constipation Call MD for any signs of infection  Move around as much as possible Call PCP to schedule hospital follow up appointment within 1 week.   Plan:  Telephone follow up appointment with care management team member scheduled for:  11/07/2023  The patient has been provided with contact information for the care management team and has been advised to call with any health related questions or concerns.        Alan Ee, RN, BSN, CEN [employer]- Transition of Care Team.  [facility] [phone]

## 2023-11-04 ENCOUNTER — Other Ambulatory Visit: Payer: Self-pay | Admitting: Family Medicine

## 2023-11-04 ENCOUNTER — Telehealth: Payer: Self-pay | Admitting: Internal Medicine

## 2023-11-04 ENCOUNTER — Other Ambulatory Visit: Payer: Self-pay

## 2023-11-04 DIAGNOSIS — E039 Hypothyroidism, unspecified: Secondary | ICD-10-CM

## 2023-11-04 NOTE — Telephone Encounter (Signed)
 Rescheduled patients missed appointment from last week. Called and spoke with the patient about new day and time. She is aware.

## 2023-11-04 NOTE — Telephone Encounter (Signed)
 Source  [place], Natalie Baker (Patient)   Subject  Natalie Baker, Natalie Baker (Patient)   Topic  Clinical - Prescription Issue    Summary  levothyroxine  update  Communication  Reason for CRM: PT states she is on levothyroxine  (SYNTHROID ) 100 MCG tablet and does NOT want to call in every month for this to be refilled. Previously she was prescribed 12 refills with her last PCP and she has a lot to keep up with already with heart failure.        Is this something we can keep refilled for her on a continual basis?        She is also requesting a refill (only has 3 left) currently.    [id number] - [place], [place] - [address] [place] [address]    [phone] [address] [place] [address]    [place] [address]        Patient callback-[phone]

## 2023-11-05 ENCOUNTER — Ambulatory Visit: Admitting: Family Medicine

## 2023-11-05 ENCOUNTER — Ambulatory Visit: Payer: Self-pay | Admitting: Family Medicine

## 2023-11-05 ENCOUNTER — Encounter: Payer: Self-pay | Admitting: Family Medicine

## 2023-11-05 ENCOUNTER — Telehealth: Payer: Self-pay

## 2023-11-05 VITALS — BP 103/68 | HR 76 | Temp 96.3°F | Ht 62.0 in | Wt 176.2 lb

## 2023-11-05 DIAGNOSIS — I420 Dilated cardiomyopathy: Secondary | ICD-10-CM

## 2023-11-05 DIAGNOSIS — E039 Hypothyroidism, unspecified: Secondary | ICD-10-CM

## 2023-11-05 DIAGNOSIS — N2889 Other specified disorders of kidney and ureter: Secondary | ICD-10-CM

## 2023-11-05 DIAGNOSIS — N179 Acute kidney failure, unspecified: Secondary | ICD-10-CM

## 2023-11-05 LAB — BASIC METABOLIC PANEL WITH GFR
BUN: 13 mg/dL (ref 6–23)
CO2: 30 meq/L (ref 19–32)
Calcium: 9.1 mg/dL (ref 8.4–10.5)
Chloride: 97 meq/L (ref 96–112)
Creatinine, Ser: 1.38 mg/dL — ABNORMAL HIGH (ref 0.40–1.20)
GFR: 39.47 mL/min — ABNORMAL LOW (ref >60.00)
Glucose, Bld: 116 mg/dL — ABNORMAL HIGH (ref 70–99)
Potassium: 4.3 meq/L (ref 3.5–5.1)
Sodium: 135 meq/L (ref 135–145)

## 2023-11-05 MED ORDER — LEVOTHYROXINE SODIUM 100 MCG PO TABS: ORAL_TABLET | ORAL | 3 refills | Status: AC

## 2023-11-05 NOTE — Telephone Encounter (Signed)
 Left VM to advise pt that it was ordered today at: [id number] - [place], [place] - [address] [place] [address]  [address] [place] [address], [place] [address]   Requested pt call back if she has any additional questions.   Copied from CRM [id number]. Topic: Clinical - Prescription Issue >> Nov 05, 2023 12:23 PM Mesmerise C wrote: Reason for CRM: [facility] stating [facility] was denied showing refill not appropriate but also showing was sent today to pharmacy please follow back up with the patient once it's been sent in

## 2023-11-05 NOTE — Telephone Encounter (Signed)
Ov today.

## 2023-11-05 NOTE — Progress Notes (Signed)
 11/05/2023  CC:  Chief Complaint  Patient presents with   Hospitalization Follow-up    Patient is a 67 y.o.  female who presents for  hospital follow up, specifically Transitional Care Services face-to-face visit. Dates hospitalized: 10/27/2023 to 10/30/2023. Days since d/c from hospital: 6 days Patient was discharged from hospital to home. Reason for admission to hospital: Acute appendicitis. Date of interactive (phone) contact with patient and/or caregiver: 10/31/2023  I have reviewed patient's discharge summary plus pertinent specific notes, labs, and imaging from the hospitalization.  On surveillance CT chest and abdomen and pelvis on 10/23/2023 she had appendicitis changes.  She was told to go to the emergency department because she had developed right lower quadrant abdominal pain.  A repeat CT showed continued signs of acute appendicitis.  She was admitted and got laparoscopic appendectomy.  She had an acute exacerbation of systolic and diastolic heart failure and required some oxygen and diuresis.  She had acute kidney injury.  Serum creatinine bumped to 1.73 on admission (baseline around 1.43). Discharge weight was 80 kg.  She does not require oxygen at discharge.  Currently: Natalie Baker feels pretty well.  She has some diffuse lower abdominal soreness, mainly on the left. She is eating and drinking well.  Bowels moving.  Emptying bladder well. No fever.  No shortness of breath or cough.  Wounds without redness or swelling As per hospital DC instructions she resumed her torsemide  3 days ago. She continues to hold Farxiga , losartan , potassium, and spironolactone .     Medication reconciliation was done today and patient is taking meds as recommended by discharging hospitalist/specialist.   Discharge medications: Pause Farxiga , losartan , potassium chloride , spironolactone , and torsemide . Take amiodarone  200 mg tab, half tab daily, Eliquis  5 mg twice daily, estradiol  patch 2 days a  week, ivabradine  7.5 mg twice daily, levothyroxine  100 mcg tab daily, Zofran  4 mg 3 times daily as needed, oxycodone  5 mg every 6 hours as needed, Compazine  every 6 hours as needed, temazepam  30 mg nightly as needed.  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 [facility] and others   Obesity    BMI 34   Osteoporosis    most recent DEXA 02/2022   Pre-diabetes    Secondary nonischemic congestive cardiomyopathy (HCC)    Adriamycin  2020   Ventricular tachycardia (HCC)    Vitamin D  deficiency     PSH:  Past Surgical History:  Procedure Laterality Date   ABDOMINAL HYSTERECTOMY  1997   CESAREAN SECTION  1981   CHOLECYSTECTOMY N/A 09/25/2020   Procedure: LAPAROSCOPIC CHOLECYSTECTOMY;  Surgeon: Stevie Herlene Righter, MD;  Location: [facility];  Service: General;  Laterality: N/A;  60 MINUTES   COLONOSCOPY     03/2018 NO POLYPS   COLONOSCOPY W/ BIOPSIES  12/29/2006   IR IMAGING GUIDED PORT INSERTION  01/15/2019   LAPAROSCOPIC APPENDECTOMY N/A 10/29/2023   Procedure: APPENDECTOMY, LAPAROSCOPIC;  Surgeon: Lyndel Deward PARAS, MD;  Location: [facility];  Service: General;  Laterality: N/A;   LAPAROSCOPY     1991 for emdometriosis   RIGHT/LEFT HEART CATH AND CORONARY ANGIOGRAPHY N/A 06/25/2019   Procedure:  RIGHT/LEFT HEART CATH AND CORONARY ANGIOGRAPHY;  Surgeon: Cherrie Toribio SAUNDERS, MD;  Location: [facility];  Service: Cardiovascular;  Laterality: N/A;   TEE WITHOUT CARDIOVERSION N/A 07/04/2021   Procedure: TRANSESOPHAGEAL ECHOCARDIOGRAM (TEE);  Surgeon: Cherrie Toribio SAUNDERS, MD;  Location: [facility] ENDOSCOPY;  Service: Cardiovascular;  Laterality: N/A;    TRANSTHORACIC ECHOCARDIOGRAM     05/17/22 ejection fraction 35% (unchanged from 2023), global hypokinesis, mild RV dysfunction, mild to moderate MR.   UPPER GASTROINTESTINAL ENDOSCOPY      MEDS:  Outpatient Medications Prior to Visit  Medication Sig Dispense Refill   amiodarone  (PACERONE ) 200 MG tablet TAKE 1/2 TABLET BY MOUTH DAILY 45 tablet 3   ELIQUIS  5 MG TABS tablet TAKE 1 TABLET BY MOUTH TWICE A DAY 60 tablet 6   estradiol  (CLIMARA  - DOSED IN MG/24 HR) 0.0375 mg/24hr patch Place 0.0375 mg onto the skin 2 (two) times a week. Tuesdays & Fridays     ivabradine  (CORLANOR ) 7.5 MG TABS tablet Take 1 tablet (7.5 mg total) by mouth 2 (two) times daily with a meal. 60 tablet 11   ondansetron  (ZOFRAN -ODT) 4 MG disintegrating tablet Take 1 tablet (4 mg total) by mouth every 8 (eight) hours as needed for nausea or vomiting. 30 tablet 3   oxyCODONE  (OXY IR/ROXICODONE ) 5 MG immediate release tablet Take 1 tablet (5 mg total) by mouth every 6 (six) hours as needed for moderate pain (pain score 4-6) or severe pain (pain score 7-10). 20 tablet 0   temazepam  (RESTORIL ) 30 MG capsule Take 1 capsule (30 mg total) by mouth at bedtime as needed for sleep. 30 capsule 0   levothyroxine  (SYNTHROID ) 100 MCG tablet TAKE 1 TABLET BY MOUTH DAILY ON AN EMPTY STOMACH WITH ONLY WATER FOR 30 MINUTES & NO ANTACID/CALCIUM /MAGNESIUM  FOR 4 HOURS & AVOID BIOTIN 30 tablet 0   FARXIGA  10 MG TABS tablet TAKE 1 TABLET BY MOUTH EVERY DAY (Patient not taking: Reported on 11/05/2023) 30 tablet 11   losartan  (COZAAR ) 25 MG tablet Take 1 tablet (25 mg total) by mouth daily. (Patient not taking: Reported on 11/05/2023) 90 tablet 3   potassium chloride  (KLOR-CON ) 10 MEQ tablet 2 tabs po qd (Patient not taking: Reported on 11/05/2023)     prochlorperazine  (COMPAZINE ) 10 MG tablet TAKE 1 TABLET BY MOUTH EVERY 6 HOURS AS NEEDED FOR NAUSEA OR VOMITING. (Patient not taking: Reported on 11/05/2023) 30 tablet 0   spironolactone  (ALDACTONE ) 25 MG  tablet TAKE 1 TABLET BY MOUTH EVERY DAY (Patient not taking: Reported on 11/05/2023) 90 tablet 3   torsemide  (DEMADEX ) 20 MG tablet Take 1 tablet (20 mg total) by mouth daily. (Patient not taking: Reported on 11/05/2023)     No facility-administered medications prior to visit.    Physical Exam    11/05/2023   11:21 AM 10/31/2023    2:42 PM 10/31/2023    2:40 PM  Vitals with BMI  Height 5' 2    Weight 176 lbs 3 oz 177 lbs 177 lbs  BMI 32.22 32.37 32.37  Systolic 103    Diastolic 68    Pulse 76     Gen: Alert, well appearing.  Patient is oriented to person, place, time, and situation. AFFECT: pleasant, lucid thought and speech. CV: RRR, no m/r/g.   LUNGS: CTA bilat, nonlabored resps, good aeration in all lung fields. ABD: Soft, nondistended.  Bowel sounds normal.  Lap incisions intact and without sign of infection or seroma.  She has some diffuse lower quadrant tenderness to  palpation without guarding or rebound.   Pertinent labs/imaging Last CBC Lab Results  Component Value Date   WBC 7.8 10/30/2023   HGB 11.5 (L) 10/30/2023   HCT 39.5 10/30/2023   MCV 79.0 (L) 10/30/2023   MCH 23.0 (L) 10/30/2023   RDW 19.3 (H) 10/30/2023   PLT 415 (H) 10/30/2023   Lab Results  Component Value Date   IRON 62 07/29/2016   TIBC 409 07/29/2016   Last metabolic panel Lab Results  Component Value Date   GLUCOSE 170 (H) 10/30/2023   NA 135 10/30/2023   K 4.8 10/30/2023   CL 97 (L) 10/30/2023   CO2 25 10/30/2023   BUN 15 10/30/2023   CREATININE 1.23 (H) 10/30/2023   GFRNONAA 48 (L) 10/30/2023   CALCIUM  9.3 10/30/2023   PHOS 3.8 06/25/2019   PROT 5.4 (L) 10/29/2023   ALBUMIN 3.8 10/29/2023   BILITOT 0.3 10/29/2023   ALKPHOS 131 (H) 10/29/2023   AST 28 10/29/2023   ALT 10 10/29/2023   ANIONGAP 13 10/30/2023   Last lipids Lab Results  Component Value Date   CHOL 254 (H) 12/13/2022   HDL 67 12/13/2022   LDLCALC 165 (H) 12/13/2022   TRIG 107 12/13/2022   CHOLHDL 3.8  12/13/2022   Last hemoglobin A1c Lab Results  Component Value Date   HGBA1C 5.8 07/07/2023   Last thyroid  functions Lab Results  Component Value Date   TSH 0.712 09/18/2023   T4TOTAL 5.3 06/24/2019   FREET4 1.49 (H) 09/18/2023   ASSESSMENT/PLAN:  #1 status post appendectomy.  Recovering well.  She has follow-up with her surgeon in about a month.  #2 secondary nonischemic cardiomyopathy.  Euvolemic currently. Continue torsemide  20 mg daily. She will continue to hold spironolactone , losartan , and Farxiga  as per Dr. Nelle instructions.  She has follow-up with him in a couple of days.  3.  Chronic renal insufficiency stage III.  She had slight acute kidney injury in the hospital.  Monitor basic metabolic panel today.   4.  Acute left arm DVT March 2023. Given her malignancy (even though it is currently not clearly recurrent/progressing) I think she is at high risk for recurrent thrombosis so we will keep her on Eliquis  5 mg twice daily.    5. history of V. tach, PVCs. Continue amiodarone  100 mg a day as per cardiology.   #6 hypothyroidism. TSH 0.712 on 09/18/23. Continue levothyroxine  100 mcg daily. . Medical decision making of moderate complexity was utilized today.  FOLLOW UP: 3 months  Signed:  Gerlene Hockey, MD           11/05/2023

## 2023-11-06 ENCOUNTER — Inpatient Hospital Stay: Admitting: Internal Medicine

## 2023-11-07 ENCOUNTER — Other Ambulatory Visit: Payer: Self-pay

## 2023-11-07 NOTE — Patient Instructions (Signed)
 Visit Information  Thank you for taking time to visit with me today. Please don't hesitate to contact me if I can be of assistance to you before our next scheduled telephone appointment.  Our next appointment is by telephone on 11/13/2023 at noon  Following is a copy of your care plan:   Goals Addressed             This Visit's Progress    VBCI Transitions of Care (TOC) Care Plan       Problems:  Recent Hospitalization for treatment of appendectomy 11/07/2023  Patient reports that she is doing well. Reports minimal pain. Has had hospital follow up with PCP . Will see cardiology next week.  CHF 11/07/2023 weight today of 172.7 pounds. No swelling   Goal:  Over the next 30 days, the patient will not experience hospital readmission  Interventions:  Transitions of Care: Doctor Visits  - discussed the importance of doctor visits Post discharge activity limitations prescribed by provider reviewed Post-op wound/incision care reviewed with patient/caregiver Reviewed Signs and symptoms of infection Reviewed post op PCP visit Assessed pain Denies any signs of infection- glue still on incisions Next transition of care call scheduled Assessed bowels are moving well.   Heart Failure Interventions: Basic overview and discussion of pathophysiology of Heart Failure reviewed Provided education on low sodium diet Assessed need for readable accurate scales in home Provided education about placing scale on hard, flat surface Advised patient to weigh each morning after emptying bladder Discussed importance of daily weight and advised patient to weigh and record daily Reviewed role of diuretics in prevention of fluid overload and management of heart failure; Discussed the importance of keeping all appointments with provider  Surgery (appendectomy): Evaluation of current treatment plan related to appendectomy surgery assessed patient/caregiver understanding of surgical procedure   reviewed  post-operative instructions with patient/caregiver addressed questions about post - surgical incision care  reviewed medications with patient and addressed questions reviewed scheduled provider appointments with patient(  cardiology and surgeon) confirmed availability of transportation to all appointments yes Patient Self Care Activities:  Attend all scheduled provider appointments Call pharmacy for medication refills 3-7 days in advance of running out of medications Call provider office for new concerns or questions  Notify RN Care Manager of TOC call rescheduling needs Participate in Transition of Care Program/Attend TOC scheduled calls Take medications as prescribed   call office if I gain more than 2 pounds in one day or 5 pounds in one week do ankle pumps when sitting track weight in diary use salt in moderation watch for swelling in feet, ankles and legs every day weigh myself daily Monitor for constipation Call MD for any signs of infection  Move around as much as possible  Plan:  Telephone follow up appointment with care management team member scheduled for:  11/13/2023  The patient has been provided with contact information for the care management team and has been advised to call with any health related questions or concerns.         Care plan and visit instructions communicated with the patient verbally today. Patient agrees to receive a copy in MyChart. Active MyChart status and patient understanding of how to access instructions and care plan via MyChart confirmed with patient.     Telephone follow up appointment with care management team member scheduled for:  Please call the care guide team at [phone] if you need to cancel or reschedule your appointment.   Please call the Suicide and  Crisis Lifeline: 988 call the [place]  National Suicide Prevention Lifeline: 4405002888 or TTY: 972-808-0119 TTY 859-120-4744) to talk to a trained counselor call 1-800-273-TALK  (toll free, 24 hour hotline) call 911 if you are experiencing a Mental Health or Behavioral Health Crisis or need someone to talk to.  Alan Ee, RN, BSN, CEN [employer]- Transition of Care Team.  [facility] [phone]

## 2023-11-07 NOTE — Transitions of Care (Post Inpatient/ED Visit) (Signed)
 Transition of Care week 2  Visit Note  11/07/2023  Name: Natalie Baker MRN: [id number]          DOB: [birth year]  Situation: Patient enrolled in [facility] 30-day program. Visit completed with patient by telephone.   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 CHOP and others   Obesity    BMI 34   Osteoporosis    most recent DEXA 02/2022   Pre-diabetes    Secondary nonischemic congestive cardiomyopathy (HCC)    Adriamycin  2020   Ventricular tachycardia (HCC)    Vitamin D  deficiency     Assessment: Patient reports that she is doing well. Denies any new concerns today.  Patient Reported Symptoms: Cognitive Cognitive Status: Able to follow simple commands, Alert and oriented to person, place, and time, Normal speech and language skills      Neurological Neurological Review of Symptoms: Not assessed    HEENT HEENT Symptoms Reported: Not assessed      Cardiovascular Cardiovascular Symptoms Reported: No symptoms reported Weight: 172 lb 11.2 oz (78.3 kg) (home monitoring) Cardiovascular Comment: Denies any swelling. will see cardiology next week.  Respiratory Respiratory Symptoms Reported: No symptoms reported    Endocrine Endocrine Symptoms Reported: No symptoms reported Is patient diabetic?: No Endocrine Self-Management Outcome: 5 (very good)  Gastrointestinal Additional Gastrointestinal Details: LBM today.  abdominal soreness. no NV Gastrointestinal Comment: denies NV    Genitourinary Genitourinary  Symptoms Reported: No symptoms reported    Integumentary Integumentary Symptoms Reported: Incision Additional Integumentary Details: my incisions look good.   Glue intact. Skin Management Strategies: Routine screening Skin Self-Management Outcome: 5 (very good)  Musculoskeletal Musculoskelatal Symptoms Reviewed: No symptoms reported        Psychosocial Psychosocial Symptoms Reported: Not assessed         Today's Vitals   11/07/23 1308 11/07/23 1319 11/07/23 1320  Weight:  172 lb 11.2 oz (78.3 kg)   PainSc: 2   2      Medications Reviewed Today     Reviewed by Rumalda Alan PENNER, RN (Registered Nurse) on 11/07/23 at 1308  Med List Status: <None>   Medication Order Taking? Sig Documenting Provider Last Dose Status Informant  amiodarone  (PACERONE ) 200 MG tablet [id number] Yes TAKE 1/2 TABLET BY MOUTH DAILY Bensimhon, Daniel R, MD  Active Self, Pharmacy Records  ELIQUIS  5 MG TABS tablet [id number] Yes TAKE 1 TABLET BY MOUTH TWICE A DAY Bensimhon, Toribio SAUNDERS, MD  Active Self, Pharmacy Records  estradiol  (CLIMARA  - DOSED IN MG/24 HR) 0.0375 mg/24hr patch [id number] Yes Place 0.0375 mg onto the skin 2 (two) times a week. Tuesdays & Fridays [provider]  Active Self, Pharmacy Records           Med Note RANELLE, KEENE HERO   Fri May 04, 2021  9:49 AM)    FARXIGA  10 MG TABS tablet [id number] Yes TAKE 1 TABLET BY MOUTH EVERY DAY Bensimhon, Toribio SAUNDERS, MD  Active Self, Pharmacy Records  ivabradine  (CORLANOR ) 7.5 MG TABS tablet [id number] Yes  Take 1 tablet (7.5 mg total) by mouth 2 (two) times daily with a meal. Bensimhon, Toribio SAUNDERS, MD  Active Self, Pharmacy Records  levothyroxine  (SYNTHROID ) 100 MCG tablet [id number] Yes TAKE 1 TABLET BY MOUTH DAILY ON AN EMPTY STOMACH WITH ONLY WATER FOR 30 MINUTES & NO ANTACID/CALCIUM /MAGNESIUM  FOR 4 HOURS & AVOID BIOTIN McGowen, Aleene DEL, MD  Active   losartan  (COZAAR ) 25 MG tablet [id number]  Take 1 tablet (25 mg total) by mouth daily.  Patient not taking:  Reported on 11/07/2023   Bensimhon, Toribio SAUNDERS, MD  Active Self, Pharmacy Records           Med Note DELETA DEBBY SAUNDERS Pablo Oct 27, 2023  5:26 PM) Recently increased to 25mg  daily but made her feel bad and cause insomnia.  ondansetron  (ZOFRAN -ODT) 4 MG disintegrating tablet [id number] Yes Take 1 tablet (4 mg total) by mouth every 8 (eight) hours as needed for nausea or vomiting. Sherrod Sherrod, MD  Active Self, Pharmacy Records  oxyCODONE  (OXY IR/ROXICODONE ) 5 MG immediate release tablet [id number] Yes Take 1 tablet (5 mg total) by mouth every 6 (six) hours as needed for moderate pain (pain score 4-6) or severe pain (pain score 7-10). Davia Nydia POUR, MD  Active   potassium chloride  (KLOR-CON ) 10 MEQ tablet [id number] Yes 2 tabs po qd McGowen, Philip H, MD  Active Self, Pharmacy Records  prochlorperazine  (COMPAZINE ) 10 MG tablet [id number] Yes TAKE 1 TABLET BY MOUTH EVERY 6 HOURS AS NEEDED FOR NAUSEA OR VOMITING. Sherrod Sherrod, MD  Active Self, Pharmacy Records  spironolactone  (ALDACTONE ) 25 MG tablet [id number]  TAKE 1 TABLET BY MOUTH EVERY DAY  Patient not taking: Reported on 11/07/2023   Bensimhon, Toribio SAUNDERS, MD  Active Self, Pharmacy Records  temazepam  (RESTORIL ) 30 MG capsule [id number] Yes Take 1 capsule (30 mg total) by mouth at bedtime as needed for sleep. Sherrod Sherrod, MD  Active Self, Pharmacy Records  torsemide  (DEMADEX ) 20 MG tablet [id number] Yes Take 1 tablet (20 mg total) by mouth daily. McGowen, Aleene DEL, MD  Active Self, Pharmacy Records  Med List Note Carmella Asberry FALCON [facility] 06/15/20 1353): [place]  filled at [facility]            Goals Addressed             This Visit's Progress    [facility] Transitions of Care (TOC) Care Plan       Problems:  Recent Hospitalization for treatment of appendectomy 11/07/2023  Patient reports that she is doing well. Reports minimal pain. Has had hospital follow up with PCP . Will see cardiology next week.  CHF 11/07/2023 weight today of 172.7  pounds. No swelling   Goal:  Over the next 30 days, the patient will not experience hospital readmission  Interventions:  Transitions of Care: Doctor Visits  - discussed the importance of doctor visits Post discharge activity limitations prescribed by provider reviewed Post-op wound/incision care reviewed with patient/caregiver Reviewed Signs and symptoms of infection Reviewed post op PCP visit Assessed pain Denies any signs of infection- glue still on incisions Next transition of care call scheduled Assessed bowels are moving well.   Heart Failure Interventions: Basic overview and discussion of pathophysiology of Heart Failure reviewed Provided education on low sodium diet Assessed need for readable accurate scales in home Provided education about placing scale on hard, flat surface Advised patient to weigh each morning after emptying bladder Discussed importance of daily weight and advised patient to weigh and record daily Reviewed role  of diuretics in prevention of fluid overload and management of heart failure; Discussed the importance of keeping all appointments with provider  Surgery (appendectomy): Evaluation of current treatment plan related to appendectomy surgery assessed patient/caregiver understanding of surgical procedure   reviewed post-operative instructions with patient/caregiver addressed questions about post - surgical incision care  reviewed medications with patient and addressed questions reviewed scheduled provider appointments with patient(  cardiology and surgeon) confirmed availability of transportation to all appointments yes Patient Self Care Activities:  Attend all scheduled provider appointments Call pharmacy for medication refills 3-7 days in advance of running out of medications Call provider office for new concerns or questions  Notify RN Care Manager of TOC call rescheduling needs Participate in Transition of Care Program/Attend TOC scheduled  calls Take medications as prescribed   call office if I gain more than 2 pounds in one day or 5 pounds in one week do ankle pumps when sitting track weight in diary use salt in moderation watch for swelling in feet, ankles and legs every day weigh myself daily Monitor for constipation Call MD for any signs of infection  Move around as much as possible  Plan:  Telephone follow up appointment with care management team member scheduled for:  11/13/2023  The patient has been provided with contact information for the care management team and has been advised to call with any health related questions or concerns.          Recommendation:   Continue Current Plan of Care  Follow Up Plan:   Telephone follow up appointment date/time:  11/13/2023 noon  Alan Ee, RN, BSN, CEN Population Health- Transition of Care Team.  [facility] [phone]

## 2023-11-11 ENCOUNTER — Telehealth: Payer: Self-pay

## 2023-11-11 NOTE — Telephone Encounter (Signed)
 Called to confirm/remind patient of their appointment at the [facility] on 11/12/23.   Appointment:   [] Confirmed  [x] Left mess   [] No answer/No voice mail  [] VM Full/unable to leave message  [] Phone not in service  And to bring in all medications and/or complete list.

## 2023-11-11 NOTE — Progress Notes (Incomplete)
 [facility] Note   PCP: [name], MD PCP-Cardiologist: None  [facility]: Dr. Cherrie   HPI: Natalie Baker is a 68 y.o. female with a hx of palpitations thought to be PVCs, HTN, non hodgkins lymphoma treated w/ R-CHOP and systolic HF.   Admitted 6/21 with acute HF. ECHO  EF 20-25% w/ mod-severe MR.  RV moderately reduced. Developed VT and was started on amiodarone  and transferred to [facility] for [facility] which showed normal coronaries, elevated filling pressures w/ low CO c/w cardiogenic shock. CI was 1.7. She was started on milrinone , IV amiodarone  and IV Lasix  for diuresis.   Echo 9/21: EF 40-45%   Echo 05/04/20 EF 50-55% G2DD.    At oncology f/u 6/22,  repeat CT scan unfortunately showed evidence for disease progression with mild increase in the small bilateral axillary lymph nodes and moderate increase in the left supraclavicular as well as ileocolic mesenteric lymph nodes and left external iliac lymph nodes but mild increase in the periaortic retroperitoneal lymph nodes. She started treatment with acalabrutinib  100 mg p.o. twice daily on June 17 2020 and Dr. Sherrod referred her to [facility] for bone marrow transplant evaluation.   cMRI 11/22 LVEF 39%, no LV thrombus noted.   Follow up 11/22 Not a candidate for stem cell transplant, but started CAR-T therapy   Echo 1/23 with EF 40-45%. after CAR-T therapy   Admitted from HF clinic on 03/07/21 Echo EF < 20%. PICC line placed. Initial co-ox 34% and she was started on inotrope support with milrinone . Echo 3/23 EF 20-25%   Echo 06/29/21 EF 35-40% (read as 30-35%) severe MR  TEE 07/04/21 EF 20-25% severe MR moderate.  Echo 5/24  EF 35%, RV mildly reduced, mild to moderate MR Echo 6/25 EF 35-40% moderate MR  Today she returns for HF follow up, daughter via phone. Feels pretty good. Finished CR several weeks ago. Not very active at home. Can do all ADLs without problem.No edema, orthopnea or PND. Compliant with meds. SBP  100-120 (rarely in 80s)    Review of systems complete and found to be negative unless listed in HPI.    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 CHOP and others   Obesity    BMI 34   Osteoporosis    most recent DEXA 02/2022   Pre-diabetes    Secondary nonischemic congestive cardiomyopathy (HCC)    Adriamycin  2020   Ventricular tachycardia (HCC)    Vitamin D  deficiency    Current Outpatient Medications  Medication Sig Dispense Refill   amiodarone  (PACERONE ) 200 MG tablet TAKE 1/2 TABLET BY MOUTH DAILY 45 tablet 3   ELIQUIS  5 MG TABS tablet TAKE 1 TABLET BY MOUTH TWICE A DAY 60 tablet 6   estradiol  (CLIMARA  - DOSED IN MG/24 HR) 0.0375 mg/24hr patch Place 0.0375 mg onto the skin 2 (two) times a week. Tuesdays & Fridays     FARXIGA  10 MG TABS tablet TAKE 1 TABLET BY MOUTH EVERY DAY 30 tablet 11   ivabradine  (CORLANOR ) 7.5 MG TABS tablet Take 1 tablet (7.5 mg total) by mouth 2 (two) times daily  with a meal. 60 tablet 11   levothyroxine  (SYNTHROID ) 100 MCG tablet TAKE 1 TABLET BY MOUTH DAILY ON AN EMPTY STOMACH WITH ONLY WATER FOR 30 MINUTES & NO ANTACID/CALCIUM /MAGNESIUM  FOR 4 HOURS & AVOID BIOTIN 90 tablet 3   [Paused] losartan  (COZAAR ) 25 MG tablet Take 1 tablet (25 mg total) by mouth daily. (Patient not taking: Reported on 11/07/2023) 90 tablet 3   ondansetron  (ZOFRAN -ODT) 4 MG disintegrating tablet Take 1 tablet (4 mg total) by mouth every 8 (eight) hours as needed for nausea or vomiting. 30 tablet 3   oxyCODONE  (OXY IR/ROXICODONE ) 5 MG immediate release tablet Take 1 tablet (5 mg total) by mouth every 6 (six) hours as needed for moderate pain  (pain score 4-6) or severe pain (pain score 7-10). 20 tablet 0   potassium chloride  (KLOR-CON ) 10 MEQ tablet 2 tabs po qd     prochlorperazine  (COMPAZINE ) 10 MG tablet TAKE 1 TABLET BY MOUTH EVERY 6 HOURS AS NEEDED FOR NAUSEA OR VOMITING. 30 tablet 0   [Paused] spironolactone  (ALDACTONE ) 25 MG tablet TAKE 1 TABLET BY MOUTH EVERY DAY (Patient not taking: Reported on 11/07/2023) 90 tablet 3   temazepam  (RESTORIL ) 30 MG capsule Take 1 capsule (30 mg total) by mouth at bedtime as needed for sleep. 30 capsule 0   torsemide  (DEMADEX ) 20 MG tablet Take 1 tablet (20 mg total) by mouth daily.     No current facility-administered medications for this visit.   Allergies  Allergen Reactions   Adriamycin  [Doxorubicin ] Other (See Comments)    Acute heart failure (chemo from Jan - May 2021)   Ppd [Tuberculin Purified Protein Derivative] Swelling    Positive reaction.  Negative Chest Xray 06/16/12   Social History   Socioeconomic History   Marital status: Married    Spouse name: Not on file   Number of children: 1   Years of education: Not on file   Highest education level: Not on file  Occupational History   Occupation: marine scientist  Tobacco Use   Smoking status: Former    Current packs/day: 0.00    Types: Cigarettes    Start date: 01/07/1981    Quit date: 01/08/1996    Years since quitting: 27.8   Smokeless tobacco: Never   Tobacco comments:    quit 1998 but is exposed to 2nd hand smoke  Vaping Use   Vaping status: Never Used  Substance and Sexual Activity   Alcohol use: No   Drug use: No   Sexual activity: Yes    Partners: Male    Birth control/protection: Post-menopausal  Other Topics Concern   Not on file  Social History Narrative   Married, one daughter,    Educ: HS   Occup:  employed as an conservator, museum/gallery.   No tob or alc   Social Drivers of Corporate Investment Banker Strain: Low Risk  (03/22/2021)   Overall Financial Resource Strain (CARDIA)    Difficulty of Paying  Living Expenses: Not very hard  Food Insecurity: No Food Insecurity (10/31/2023)   Hunger Vital Sign    Worried About Running Out of Food in the Last Year: Never true    Ran Out of Food in the Last Year: Never true  Transportation Needs: No Transportation Needs (10/31/2023)   PRAPARE - Administrator, Civil Service (Medical): No    Lack of Transportation (Non-Medical): No  Physical Activity: Inactive (08/13/2018)   Exercise Vital Sign    Days of Exercise per Week: 0  days    Minutes of Exercise per Session: 0 min  Stress: No Stress Concern Present (08/13/2018)   [employer] of Occupational Health - Occupational Stress Questionnaire    Feeling of Stress : Only a little  Social Connections: Moderately Integrated (10/28/2023)   Social Connection and Isolation Panel    Frequency of Communication with Friends and Family: More than three times a week    Frequency of Social Gatherings with Friends and Family: Once a week    Attends Religious Services: More than 4 times per year    Active Member of [employer] or Organizations: No    Attends Banker Meetings: Never    Marital Status: Married  Catering Manager Violence: Not At Risk (10/31/2023)   Humiliation, Afraid, Rape, and Kick questionnaire    Fear of Current or Ex-Partner: No    Emotionally Abused: No    Physically Abused: No    Sexually Abused: No   Family History  Problem Relation Age of Onset   Heart disease Mother 44       chf   Diabetes Mother    Thyroid  disease Mother    Cancer Mother 43       female/ skin   Hypertension Mother    Liver disease Mother    Hypertension Father    Alcohol abuse Father    Heart disease Father    Breast cancer Sister 13   Diabetes Sister    Hypothyroidism Sister    Hashimoto's thyroiditis Sister    Hyperlipidemia Brother    Hypertension Brother        x 2   Diabetes Brother    Hypothyroidism Brother    Prostate cancer Brother 56   Liver disease Maternal  Grandmother    Diabetes Paternal Grandmother    Breast cancer Maternal Aunt    Hyperlipidemia Maternal Aunt    Heart disease Maternal Aunt    Bone cancer Maternal Aunt        metastatic   Breast cancer Niece 79   Colon cancer Neg Hx    Colon polyps Neg Hx    Esophageal cancer Neg Hx    Rectal cancer Neg Hx    Stomach cancer Neg Hx    There were no vitals taken for this visit.  Wt Readings from Last 3 Encounters:  11/07/23 78.3 kg (172 lb 11.2 oz)  11/05/23 79.9 kg (176 lb 3.2 oz)  10/31/23 80.3 kg (177 lb)   PHYSICAL EXAM: General:  Well appearing. No resp difficulty HEENT: normal Neck: supple. no JVD. Carotids 2+ bilat; no bruits. No lymphadenopathy or thryomegaly appreciated. Cor: PMI nondisplaced. Regular rate & rhythm. No rubs, gallops or murmurs. Lungs: clear Abdomen: soft, nontender, nondistended. No hepatosplenomegaly. No bruits or masses. Good bowel sounds. Extremities: no cyanosis, clubbing, rash, edema Neuro: alert & orientedx3, cranial nerves grossly intact. moves all 4 extremities w/o difficulty. Affect pleasant  ECG: NSR 68 1AVB Personally reviewed  ASSESSMENT & PLAN: 1. Chronic Systolic HF  - Echo 1/21 EF 55-60%. RV normal - Echo 6/21 EF 20-25%  moderately reduced RV function with severe MR/TR - Cath 06/25/19 no CAD. - Drop in EF felt most likely from R-CHOP - Echo 05/04/20 w/ EF 50-55% G2DD. RV normal - Echo 9/22 at [facility] EF back down to 40% w/ ? LV thrombus - 12/22 CAR-T therapy at [facility] - cMRI 11/22 LVEF 39%, no LV clot - [facility] EF 40-45% - Echo 3/23: EF 20-25%, RV mildly reduced,  RVSP 44 mmHg - Direct admit from clinic 03/07/21  Repeat echo, EF 20%.  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 Required milrinone  for low-output.  - Echo 06/29/21 EF improved 35-40% (read as 30-35%) with new severe MR - TEE 07/04/21 EF 20-25% severe MR moderate - Echo (5/24): EF 35%, RV mildly reduced, mild to moderate MR - Echo 6/25 35-40% mod MR Personally reviewed - Stable  NYHA II-III but quite inactive Volume ok - Increase losartan  to 25mg  at bedtime. - Continue torsemide  20 mg daily + 40 KCL daily (change to 10 mEq tabs) - Continue Farxiga  10 mg daily. NO GU symptoms. - Continue spiro 25 mg daily.  - Continue ivabradine  7.5 mg bid  - Get labs today  - Encouraged her to be more active  2. H/o VT + PVCs - Quiescent - Continue amio 100 mg daily - Check amio labs today - Consdier drop to 50mg  daily at next visit  3. Severe MR  - likely functional - TEE 07/04/21 EF 20-25% severe MR moderate - Mild to moderate on most recent echo - Echo 6/25 moderate MR   4. Stage IV non-Hodgkin's lymphoma, mantle cell lymphoma   - s/p R-CHOP w/ Neulasta . - Evidence of disease progression on f/u Chest CT 6/22. - s/p CAR-T therapy.12/22 - Followed at [facility] with watchful waiting   5. Partial Lt Brachial DVT (provoked) - Diagnosed 03/13/21 - Continue Eliquis    6. HL - LDL 155  - Felt bad on Crestor .  - Per PCP    Harlene CHRISTELLA Gainer, FNP 11/11/23

## 2023-11-11 NOTE — Progress Notes (Unsigned)
 [facility] OFFICE PROGRESS NOTE  Natalie Aleene DEL, MD [address] [place] [address]  DIAGNOSIS:  Stage IV non-Hodgkin's lymphoma, mantle cell lymphoma.  She presented with left iliac and retroperitoneal lymphadenopathy.  She also presented with left axillary lymphadenopathy and bone marrow involvement.  She was diagnosed in December 2020.   PRIOR THERAPY: 1) Systemic chemotherapy with R-CHOP with Neulasta  support.  First dose on 01/27/2019. Status post 6 cycles.   2) Acalabrutinib  100 mg p.o. twice daily.  First dose started June 17, 2020.  Status post 3 months of treatment. 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 female returns to the clinic today for a follow-up visit. The patient was last seen in the clinic in April 2025.   She was supposed to be seen last month but her CT scan showed appendicitis and she was subsequently seen in the ER and she underwent ***. Appendectomy on 10/29/23. She also had acute exacerbation of her systolic and diastolic heart failure which required oxygen and diuretics.   The patient is followed for her history of mantle cell lymphoma.  In summary the patient was diagnosed with mantle cell lymphoma in 2020.  She was treated with R-CHOP chemotherapy followed by acalabrutinib  for 3 months followed by CAR-T cell therapy at [facility] with Tecartus . This was completed in January 2023 and she has been on observation since that time.   [facility] wants use to check CD4 labs to determine if she needs to continue valtrex  and bactrim ?  The patient denies any major changes in her health since she was last seen. She is  feeling somewhat better than how she is feeling at her last appointment. She denies any fever, chills, or any additional weight loss. She has some baseline dyspnea on exertion which is unchanged over the last few months. She denies any significant cough. She follows closely with cardiology due to heart failure during the course of her R-CHOP chemotherapy and she has a follow-up appointment with her cardiologist ***  She denies any diarrhea. She sometimes has constipation for which she will take stool softener, Senokot, or Metamucil. She denies any palpable lymphadenopathy. She is here today for evaluation and to review her CT scan results.   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 CHOP and others   Obesity    BMI 34   Osteoporosis    most recent DEXA 02/2022   Pre-diabetes    Secondary nonischemic congestive cardiomyopathy (HCC)    Adriamycin  2020   Ventricular tachycardia (HCC)    Vitamin D  deficiency     ALLERGIES:  is allergic to adriamycin  [doxorubicin ] and ppd [tuberculin purified protein derivative].  MEDICATIONS:  Current Outpatient Medications  Medication Sig Dispense Refill   amiodarone  (PACERONE ) 200 MG tablet TAKE  1/2 TABLET BY MOUTH DAILY 45 tablet 3   ELIQUIS  5 MG TABS tablet TAKE 1 TABLET BY MOUTH TWICE A DAY 60 tablet 6   estradiol  (CLIMARA  - DOSED IN MG/24 HR) 0.0375 mg/24hr patch Place 0.0375 mg onto the skin 2 (two) times a week. Tuesdays & Fridays     FARXIGA  10 MG TABS tablet TAKE 1 TABLET BY MOUTH EVERY DAY 30 tablet 11   ivabradine  (CORLANOR ) 7.5 MG TABS tablet Take 1 tablet (7.5 mg total) by mouth 2  (two) times daily with a meal. 60 tablet 11   levothyroxine  (SYNTHROID ) 100 MCG tablet TAKE 1 TABLET BY MOUTH DAILY ON AN EMPTY STOMACH WITH ONLY WATER FOR 30 MINUTES & NO ANTACID/CALCIUM /MAGNESIUM  FOR 4 HOURS & AVOID BIOTIN 90 tablet 3   [Paused] losartan  (COZAAR ) 25 MG tablet Take 1 tablet (25 mg total) by mouth daily. (Patient not taking: Reported on 11/07/2023) 90 tablet 3   ondansetron  (ZOFRAN -ODT) 4 MG disintegrating tablet Take 1 tablet (4 mg total) by mouth every 8 (eight) hours as needed for nausea or vomiting. 30 tablet 3   oxyCODONE  (OXY IR/ROXICODONE ) 5 MG immediate release tablet Take 1 tablet (5 mg total) by mouth every 6 (six) hours as needed for moderate pain (pain score 4-6) or severe pain (pain score 7-10). 20 tablet 0   potassium chloride  (KLOR-CON ) 10 MEQ tablet 2 tabs po qd     prochlorperazine  (COMPAZINE ) 10 MG tablet TAKE 1 TABLET BY MOUTH EVERY 6 HOURS AS NEEDED FOR NAUSEA OR VOMITING. 30 tablet 0   [Paused] spironolactone  (ALDACTONE ) 25 MG tablet TAKE 1 TABLET BY MOUTH EVERY DAY (Patient not taking: Reported on 11/07/2023) 90 tablet 3   temazepam  (RESTORIL ) 30 MG capsule Take 1 capsule (30 mg total) by mouth at bedtime as needed for sleep. 30 capsule 0   torsemide  (DEMADEX ) 20 MG tablet Take 1 tablet (20 mg total) by mouth daily.     No current facility-administered medications for this visit.    SURGICAL HISTORY:  Past Surgical History:  Procedure Laterality Date   ABDOMINAL HYSTERECTOMY  1997   CESAREAN SECTION  1981   CHOLECYSTECTOMY N/A 09/25/2020   Procedure: LAPAROSCOPIC CHOLECYSTECTOMY;  Surgeon: Kinsinger, Herlene Righter, MD;  Location: [facility]S;  Service: General;  Laterality: N/A;  60 MINUTES   COLONOSCOPY     03/2018 NO POLYPS   COLONOSCOPY W/ BIOPSIES  12/29/2006   IR IMAGING GUIDED PORT INSERTION  01/15/2019   LAPAROSCOPIC APPENDECTOMY N/A 10/29/2023   Procedure: APPENDECTOMY, LAPAROSCOPIC;  Surgeon: Lyndel Deward PARAS, MD;  Location: [facility] ORS;  Service:  General;  Laterality: N/A;   LAPAROSCOPY     1991 for emdometriosis   RIGHT/LEFT HEART CATH AND CORONARY ANGIOGRAPHY N/A 06/25/2019   Procedure: RIGHT/LEFT HEART CATH AND CORONARY ANGIOGRAPHY;  Surgeon: Cherrie Toribio SAUNDERS, MD;  Location: [facility] INVASIVE CV LAB;  Service: Cardiovascular;  Laterality: N/A;   TEE WITHOUT CARDIOVERSION N/A 07/04/2021   Procedure: TRANSESOPHAGEAL ECHOCARDIOGRAM (TEE);  Surgeon: Cherrie Toribio SAUNDERS, MD;  Location: [facility] ENDOSCOPY;  Service: Cardiovascular;  Laterality: N/A;   TRANSTHORACIC ECHOCARDIOGRAM     05/17/22 ejection fraction 35% (unchanged from 2023), global hypokinesis, mild RV dysfunction, mild to moderate MR.   UPPER GASTROINTESTINAL ENDOSCOPY      REVIEW OF SYSTEMS:   Review of Systems  Constitutional: Negative for appetite change, chills, fatigue, fever and unexpected weight change.  HENT:   Negative for mouth sores, nosebleeds, sore throat and trouble swallowing.   Eyes: Negative for eye problems  and icterus.  Respiratory: Negative for cough, hemoptysis, shortness of breath and wheezing.   Cardiovascular: Negative for chest pain and leg swelling.  Gastrointestinal: Negative for abdominal pain, constipation, diarrhea, nausea and vomiting.  Genitourinary: Negative for bladder incontinence, difficulty urinating, dysuria, frequency and hematuria.   Musculoskeletal: Negative for back pain, gait problem, neck pain and neck stiffness.  Skin: Negative for itching and rash.  Neurological: Negative for dizziness, extremity weakness, gait problem, headaches, light-headedness and seizures.  Hematological: Negative for adenopathy. Does not bruise/bleed easily.  Psychiatric/Behavioral: Negative for confusion, depression and sleep disturbance. The patient is not nervous/anxious.     PHYSICAL EXAMINATION:  There were no vitals taken for this visit.  ECOG PERFORMANCE STATUS: {CHL ONC ECOG [id number]  Physical Exam  Constitutional: Oriented to person, place,  and time and well-developed, well-nourished, and in no distress. No distress.  HENT:  Head: Normocephalic and atraumatic.  Mouth/Throat: Oropharynx is clear and moist. No oropharyngeal exudate.  Eyes: Conjunctivae are normal. Right eye exhibits no discharge. Left eye exhibits no discharge. No scleral icterus.  Neck: Normal range of motion. Neck supple.  Cardiovascular: Normal rate, regular rhythm, normal heart sounds and intact distal pulses.   Pulmonary/Chest: Effort normal and breath sounds normal. No respiratory distress. No wheezes. No rales.  Abdominal: Soft. Bowel sounds are normal. Exhibits no distension and no mass. There is no tenderness.  Musculoskeletal: Normal range of motion. Exhibits no edema.  Lymphadenopathy:    No cervical adenopathy.  Neurological: Alert and oriented to person, place, and time. Exhibits normal muscle tone. Gait normal. Coordination normal.  Skin: Skin is warm and dry. No rash noted. Not diaphoretic. No erythema. No pallor.  Psychiatric: Mood, memory and judgment normal.  Vitals reviewed.  LABORATORY DATA: Lab Results  Component Value Date   WBC 7.8 10/30/2023   HGB 11.5 (L) 10/30/2023   HCT 39.5 10/30/2023   MCV 79.0 (L) 10/30/2023   PLT 415 (H) 10/30/2023      Chemistry      Component Value Date/Time   NA 135 11/05/2023 1146   K 4.3 11/05/2023 1146   CL 97 11/05/2023 1146   CO2 30 11/05/2023 1146   BUN 13 11/05/2023 1146   CREATININE 1.38 (H) 11/05/2023 1146   CREATININE 1.42 (H) 10/23/2023 1423   CREATININE 1.06 (H) 12/13/2022 0956      Component Value Date/Time   CALCIUM  9.1 11/05/2023 1146   ALKPHOS 131 (H) 10/29/2023 1622   AST 28 10/29/2023 1622   AST 13 (L) 10/23/2023 1423   ALT 10 10/29/2023 1622   ALT 9 10/23/2023 1423   BILITOT 0.3 10/29/2023 1622   BILITOT 0.6 10/23/2023 1423       RADIOGRAPHIC STUDIES:  CT ABDOMEN PELVIS W CONTRAST Result Date: 10/27/2023 EXAM: CT ABDOMEN AND PELVIS WITH CONTRAST 10/27/2023  08:09:10 PM TECHNIQUE: CT of the abdomen and pelvis was performed with the administration of intravenous contrast. Multiplanar reformatted images are provided for review. Automated exposure control, iterative reconstruction, and/or weight-based adjustment of the mA/kV was utilized to reduce the radiation dose to as low as reasonably achievable. 80 mL of iohexol  (OMNIPAQUE ) 300 MG/ML solution was administered intravenously. COMPARISON: 10/23/2023 CLINICAL HISTORY: with PO contrast, recent diagnosis appendicitis. Per chart: Pt reports with RLQ pain x 1 week. Pt reports having a ct and it showed appendicitis. FINDINGS: LOWER CHEST: No acute abnormality. LIVER: Hepatic steatosis. GALLBLADDER AND BILE DUCTS: Gallbladder is unremarkable. No biliary ductal dilatation. SPLEEN: No acute abnormality. PANCREAS: No acute abnormality. ADRENAL  GLANDS: No acute abnormality. KIDNEYS, URETERS AND BLADDER: No stones in the kidneys or ureters. No hydronephrosis. No perinephric or periureteral stranding. Urinary bladder is unremarkable. GI AND BOWEL: Stomach demonstrates no acute abnormality. Unchanged in appearance of the appendix which is mildly dilated measuring up to 9 mm with mural thickening and hyper-enhancement. The degree of adjacent stranding and fluid appears decreased compared to 10/23/2023. There is no bowel obstruction. PERITONEUM AND RETROPERITONEUM: No ascites. No free air. VASCULATURE: Aorta is normal in caliber. Aortic atherosclerotic calcification. LYMPH NODES: No lymphadenopathy. REPRODUCTIVE ORGANS: No acute abnormality. BONES AND SOFT TISSUES: No acute osseous abnormality. No focal soft tissue abnormality. IMPRESSION: 1. Dilated and inflamed appendix similar to 10 / 16 / 25. The degree of adjacent inflammatory stranding has decreased. Electronically signed by: Norman Gatlin MD 10/27/2023 08:21 PM EDT RP Workstation: [id number]   DG Chest Portable 1 View Result Date: 10/27/2023 CLINICAL DATA:  Follow-up  ground-glass opacities on recent CT EXAM: PORTABLE CHEST 1 VIEW COMPARISON:  05/21/2023 FINDINGS: Cardiomegaly. Right chest port catheter. Both lungs are clear. The visualized skeletal structures are unremarkable. IMPRESSION: Cardiomegaly without acute abnormality of the lungs on frontal chest radiograph. No focal airspace opacity. Electronically Signed   By: Marolyn JONETTA Jaksch M.D.   On: 10/27/2023 16:39   CT CHEST ABDOMEN PELVIS W CONTRAST Result Date: 10/27/2023 CLINICAL DATA:  Hematologic malignancy, mantle cell lymphoma, follow-up. * Tracking Code: BO * EXAM: CT CHEST, ABDOMEN, AND PELVIS WITH CONTRAST TECHNIQUE: Multidetector CT imaging of the chest, abdomen and pelvis was performed following the standard protocol during bolus administration of intravenous contrast. RADIATION DOSE REDUCTION: This exam was performed according to the departmental dose-optimization program which includes automated exposure control, adjustment of the mA and/or kV according to patient size and/or use of iterative reconstruction technique. CONTRAST:  OMNIPAQUE  IOHEXOL  300 MG/ML  SOLN COMPARISON:  Multiple priors including CT April 10, 2023 FINDINGS: CT CHEST FINDINGS Cardiovascular: Accessed right chest Port-A-Cath with tip in the right atrium. Aortic atherosclerosis. Normal size heart. No significant pericardial effusion/thickening. Mediastinum/Nodes: No suspicious thyroid  nodule. No pathologically enlarged mediastinal, hilar or axillary lymph nodes. The esophagus is grossly unremarkable. Lungs/Pleura: Interlobular septal thickening with interposed ground-glass throughout both lungs. Previously seen patchy nodular consolidations are largely resolved on today's examination. Scattered tiny pulmonary nodules for instance in the right upper lobe measuring 2 mm on image 31/6 and in the subpleural anterior left upper lobe measuring 2 mm on image 34/6. Musculoskeletal: No aggressive lytic or blastic lesion of bone. CT ABDOMEN PELVIS  FINDINGS Hepatobiliary: Hyperenhancing nodular segment III hepatic focus measuring 7 mm on image 49/2 is stable dating back to 2022 compatible with a benign finding. No suspicious hepatic lesion. Gallbladder surgically absent. No biliary ductal dilation. Pancreas: No pancreatic ductal dilation or evidence of acute inflammation. Spleen: No splenomegaly. Adrenals/Urinary Tract: Bilateral lobular adrenal thickening without discrete nodularity favor hyperplasia. No hydronephrosis. Kidneys demonstrate symmetric enhancement. Urinary bladder is unremarkable for degree of distension. Stomach/Bowel: Radiopaque enteric contrast material reaches the splenic flexure. Stomach is nondistended. No pathologic dilation of small or large bowel. Dilated fluid-filled appendix with mural hyperenhancement and wall thickening with adjacent stranding measures up to 7 mm in diameter on image 57/4. Vascular/Lymphatic: Aortic atherosclerosis. Normal caliber abdominal aorta. Smooth IVC contours. The portal, splenic and superior mesenteric veins are patent. No pathologically enlarged abdominal or pelvic lymph nodes. Reproductive: Status post hysterectomy. No adnexal masses. Other: No walled-off fluid collection.  No pneumoperitoneum. Musculoskeletal: No aggressive lytic or blastic lesion of bone.  Mild spondylosis. IMPRESSION: 1. No pathologically enlarged lymph nodes in the chest, abdomen or pelvis. 2. Dilated fluid-filled appendix with mural hyperenhancement and wall thickening with adjacent stranding measures up to 7 mm in diameter, concerning for acute appendicitis. 3. Interlobular septal thickening with interposed ground-glass throughout both lungs, may reflect pulmonary edema or an infectious/inflammatory etiology. 4. Previously seen patchy nodular pulmonary consolidations are largely resolved on today's examination. 5. Scattered tiny pulmonary nodules measuring up to 2 mm, nonspecific but favored infectious or inflammatory. These results  will be called to the ordering clinician or representative by the Radiologist Assistant, and communication documented in the PACS or [employer]. Electronically Signed   By: Reyes Holder M.D.   On: 10/27/2023 03:47     ASSESSMENT/PLAN:  This is a very pleasant 68 year old Caucasian female diagnosed with stage IV mantle cell lymphoma.  She presented with left iliac and retroperitoneal lymphadenopathy as well as left axillary and bone marrow involvement in December 2020.   She completed 6 cycles of systemic chemotherapy with R-CHOP.  She tolerated this well without any concerning adverse side effects but a few months later she developed congestive heart failure with her EF dropping to 20 to 25%.  This has improved over the last few months and is currently around 55%.  She is followed by cardiology.   Unfortunately in June 2020 she was found to have disease progression with mild increase in the small bilateral axillary lymph nodes and moderate increase in the left supraclavicular as well as ileocecal mesenteric lymph nodes and left external iliac lymph nodes and mild increase in the periaortic retroperitoneal lymph nodes.   She was then started on treatment with acalabrutinib  100 mg p.o. twice daily on 06/17/2020.   She was seen by Dr. Katryzna Jaimeson at [facility] on July 12, 2020 for discussion of potential autologous stem cell transplant.    The patient underwent CAR-T with Tecartus on January 23, 2021 after lymphoid ablation with fludarabine and Cytoxan . 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    The patient recently had a restaging CT scan performed.  The patient was seen with Dr. Sherrod today.  Dr. Sherrod personally and independently reviewed the scan and discussed the results with the patient today.  The scan showed no evidence of disease progression. There was no lymphadenopathy or splenomegaly.     Dr.  Sherrod recommends that she continue on observation with a restaging CT scan in 6 months.  ***Cd4 (valtrex /bactrim )     For insomnia she will continue to take Restoril .  The patient was advised to call immediately if she has any concerning symptoms in the interval. The patient voices understanding of current disease status and treatment options and is in agreement with the current care plan. All questions were answered. The patient knows to call the clinic with any problems, questions or concerns. We can certainly see the patient much sooner if necessary     No orders of the defined types were placed in this encounter.    I spent {CHL ONC TIME VISIT - DTPQU:[id number]} counseling the patient face to face. The total time spent in the appointment was {CHL ONC TIME VISIT - DTPQU:[id number]}.  Khamari Sheehan L Bane Hagy, PA-C 11/11/23

## 2023-11-12 ENCOUNTER — Ambulatory Visit (HOSPITAL_COMMUNITY)
Admission: RE | Admit: 2023-11-12 | Discharge: 2023-11-12 | Disposition: A | Discharge status: Home / Self Care | Source: Ambulatory Visit | Attending: Family Medicine | Admitting: Family Medicine

## 2023-11-12 ENCOUNTER — Encounter (HOSPITAL_COMMUNITY): Payer: Self-pay

## 2023-11-12 VITALS — BP 110/40 | HR 69 | Ht 62.0 in | Wt 172.0 lb

## 2023-11-12 DIAGNOSIS — Z8249 Family history of ischemic heart disease and other diseases of the circulatory system: Secondary | ICD-10-CM | POA: Insufficient documentation

## 2023-11-12 DIAGNOSIS — I340 Nonrheumatic mitral (valve) insufficiency: Secondary | ICD-10-CM | POA: Insufficient documentation

## 2023-11-12 DIAGNOSIS — C8310 Mantle cell lymphoma, unspecified site: Secondary | ICD-10-CM | POA: Diagnosis not present

## 2023-11-12 DIAGNOSIS — Z7901 Long term (current) use of anticoagulants: Secondary | ICD-10-CM | POA: Insufficient documentation

## 2023-11-12 DIAGNOSIS — E782 Mixed hyperlipidemia: Secondary | ICD-10-CM

## 2023-11-12 DIAGNOSIS — Z9049 Acquired absence of other specified parts of digestive tract: Secondary | ICD-10-CM | POA: Insufficient documentation

## 2023-11-12 DIAGNOSIS — Z87891 Personal history of nicotine dependence: Secondary | ICD-10-CM | POA: Diagnosis not present

## 2023-11-12 DIAGNOSIS — I5022 Chronic systolic (congestive) heart failure: Secondary | ICD-10-CM | POA: Diagnosis present

## 2023-11-12 DIAGNOSIS — Z86718 Personal history of other venous thrombosis and embolism: Secondary | ICD-10-CM | POA: Diagnosis not present

## 2023-11-12 DIAGNOSIS — C8316 Mantle cell lymphoma, intrapelvic lymph nodes: Secondary | ICD-10-CM

## 2023-11-12 DIAGNOSIS — I493 Ventricular premature depolarization: Secondary | ICD-10-CM

## 2023-11-12 DIAGNOSIS — Z79899 Other long term (current) drug therapy: Secondary | ICD-10-CM | POA: Insufficient documentation

## 2023-11-12 DIAGNOSIS — I472 Ventricular tachycardia, unspecified: Secondary | ICD-10-CM | POA: Diagnosis not present

## 2023-11-12 DIAGNOSIS — E785 Hyperlipidemia, unspecified: Secondary | ICD-10-CM | POA: Insufficient documentation

## 2023-11-12 MED ORDER — AMIODARONE HCL 100 MG PO TABS: 50.0000 mg | ORAL_TABLET | Freq: Every day | ORAL | 1 refills | Status: AC

## 2023-11-12 NOTE — Patient Instructions (Addendum)
 Good to see you today!  STOP Compazine   STOP Zofran   DECREASE Amiodarone  to 50 mg(1/2 tablet) daily  Please follow up with our heart failure pharmacist as scheduled  Your physician recommends that you schedule a follow-up appointment as scheduled  If you have any questions or concerns before your next appointment please send us  a message through [place] or call our office at [phone].    TO LEAVE A MESSAGE FOR THE NURSE SELECT OPTION 2, PLEASE LEAVE A MESSAGE INCLUDING: YOUR NAME DATE OF BIRTH CALL BACK NUMBER REASON FOR CALL**this is important as we prioritize the call backs  YOU WILL RECEIVE A CALL BACK THE SAME DAY AS LONG AS YOU CALL BEFORE 4:00 PM At the [facility], you and your health needs are our priority. As part of our continuing mission to provide you with exceptional heart care, we have created designated Provider Care Teams. These Care Teams include your primary Cardiologist (physician) and Advanced Practice Providers (APPs- Physician Assistants and Nurse Practitioners) who all work together to provide you with the care you need, when you need it.   You may see any of the following providers on your designated Care Team at your next follow up: Dr Toribio Fuel Dr Ezra Shuck Dr. Morene Brownie Greig Mosses, NP Caffie Shed, [place] [facility] [place], [place] Beckey Coe, NP Jordan Lee, NP Ellouise Class, NP Tinnie Redman, PharmD Jaun Bash, PharmD   Please be sure to bring in all your medications bottles to every appointment.    Thank you for choosing [facility]

## 2023-11-12 NOTE — Addendum Note (Signed)
 Encounter addended by: Glena Harlene HERO, FNP on: 11/12/2023 3:21 PM  Actions taken: Clinical Note Signed

## 2023-11-12 NOTE — Addendum Note (Signed)
 Encounter addended by: Talin Rozeboom M, RN on: 11/12/2023 3:18 PM  Actions taken: Medication long-term status modified, Order list changed, Clinical Note Signed

## 2023-11-13 ENCOUNTER — Other Ambulatory Visit: Payer: Self-pay

## 2023-11-13 ENCOUNTER — Inpatient Hospital Stay: Attending: Internal Medicine | Admitting: Physician Assistant

## 2023-11-13 VITALS — BP 107/69 | HR 66 | Temp 98.2°F | Resp 17 | Ht 62.0 in | Wt 173.2 lb

## 2023-11-13 DIAGNOSIS — Z8572 Personal history of non-Hodgkin lymphomas: Secondary | ICD-10-CM | POA: Diagnosis present

## 2023-11-13 DIAGNOSIS — C8313 Mantle cell lymphoma, intra-abdominal lymph nodes: Secondary | ICD-10-CM

## 2023-11-13 NOTE — Transitions of Care (Post Inpatient/ED Visit) (Signed)
 Transition of Care week 3  Visit Note  11/13/2023  Name: Natalie Baker MRN: [id number]          DOB: March 04, 1955  Situation: Patient enrolled in [facility] 30-day program. Visit completed with patient by telephone.   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 [facility] and others   Obesity    BMI 34   Osteoporosis    most recent DEXA 02/2022   Pre-diabetes    Secondary nonischemic congestive cardiomyopathy (HCC)    Adriamycin  2020   Ventricular tachycardia (HCC)    Vitamin D  deficiency     Assessment:  Patient reports that she is doing well. Reports that she had follow up with cardiology yesterday.  Patient Reported Symptoms: Cognitive Cognitive Status: Able to follow simple commands, Alert and oriented to person, place, and time, Normal speech and language skills      Neurological Neurological Review of Symptoms: Other: Oher Neurological Symptoms/Conditions [RPT]: spasms to the left leg and left arm and right leg.  They come and go. Denies crampy pain.  Reports spasms daily. mostly at night. Neurological Management Strategies: Routine screening Neurological Self-Management Outcome: 5 (very good)  HEENT HEENT Symptoms Reported: No symptoms reported      Cardiovascular Cardiovascular Symptoms Reported: No symptoms reported Does patient have uncontrolled Hypertension?: No Cardiovascular Management Strategies: Routine screening Weight: 168 lb (76.2 kg) Cardiovascular Self-Management Outcome: 4 (good)  Respiratory  Respiratory Symptoms Reported: No symptoms reported    Endocrine Endocrine Symptoms Reported: No symptoms reported Is patient diabetic?: No Endocrine Self-Management Outcome: 5 (very good)  Gastrointestinal Gastrointestinal Symptoms Reported: Abdominal pain or discomfort Additional Gastrointestinal Details: left lower abdominal pain. Hold this area when she moves.  Reports bowels are moving well.  Encoruaged good hydration.      Genitourinary Genitourinary Symptoms Reported: No symptoms reported    Integumentary Additional Integumentary Details: reports no signs of infection, Reports glue is still attached. Skin Management Strategies: Routine screening Skin Self-Management Outcome: 5 (very good)  Musculoskeletal Musculoskelatal Symptoms Reviewed: Other Other Musculoskeletal Symptoms: reports muscle spasms        Psychosocial Psychosocial Symptoms Reported: Not assessed         Today's Vitals   11/13/23 1212 11/13/23 1213  Weight: 168 lb (76.2 kg)   PainSc:  8      Medications Reviewed Today     Reviewed by Rumalda Alan PENNER, RN (Registered Nurse) on 11/13/23 at 1224  Med List Status: <None>   Medication Order Taking? Sig Documenting Provider Last Dose Status Informant  amiodarone  (PACERONE ) 100 MG tablet [id number] Yes Take 0.5 tablets (50 mg total) by mouth daily. Crane, [place], [place]  Active   ELIQUIS  5 MG TABS tablet [id number] Yes TAKE 1 TABLET BY MOUTH TWICE A DAY Bensimhon, Toribio SAUNDERS, MD  Active Self, Pharmacy Records  estradiol  (CLIMARA  - DOSED IN MG/24 HR) 0.0375 mg/24hr patch [id number] Yes Place 0.0375 mg onto the skin 2 (two) times a week. Tuesdays & Fridays [provider]  Active Self, Pharmacy  Records           Med Note RANELLE KEENE HERO   Fri May 04, 2021  9:49 AM)    FARXIGA  10 MG TABS tablet [id number] Yes TAKE 1 TABLET BY MOUTH EVERY DAY Bensimhon, Toribio SAUNDERS, MD  Active Self, Pharmacy Records  ivabradine  (CORLANOR ) 7.5 MG TABS tablet [id number] Yes  Take 1 tablet (7.5 mg total) by mouth 2 (two) times daily with a meal. Bensimhon, Toribio SAUNDERS, MD  Active Self, Pharmacy Records  levothyroxine  (SYNTHROID ) 100 MCG tablet [id number] Yes TAKE 1 TABLET BY MOUTH DAILY ON AN EMPTY STOMACH WITH ONLY WATER FOR 30 MINUTES & NO ANTACID/CALCIUM /MAGNESIUM  FOR 4 HOURS & AVOID BIOTIN McGowen, Aleene DEL, MD  Active   losartan  (COZAAR ) 25 MG tablet [id number]  Take 1 tablet (25 mg total) by mouth daily.  Patient not taking: Reported on 11/13/2023   Bensimhon, Toribio SAUNDERS, MD  Active Self, Pharmacy Records           Med Note DELETA DEBBY SAUNDERS Pablo Oct 27, 2023  5:26 PM) Recently increased to 25mg  daily but made her feel bad and cause insomnia.  oxyCODONE  (OXY IR/ROXICODONE ) 5 MG immediate release tablet [id number] Yes Take 1 tablet (5 mg total) by mouth every 6 (six) hours as needed for moderate pain (pain score 4-6) or severe pain (pain score 7-10). Davia Nydia POUR, MD  Active   potassium chloride  (KLOR-CON ) 10 MEQ tablet [id number] Yes 2 tabs po qd McGowen, Philip H, MD  Active Self, Pharmacy Records  spironolactone  (ALDACTONE ) 25 MG tablet [id number]  TAKE 1 TABLET BY MOUTH EVERY DAY  Patient not taking: Reported on 11/13/2023   Bensimhon, Toribio SAUNDERS, MD  Active Self, Pharmacy Records  temazepam  (RESTORIL ) 30 MG capsule [id number] Yes Take 1 capsule (30 mg total) by mouth at bedtime as needed for sleep. Sherrod Sherrod, MD  Active Self, Pharmacy Records  torsemide  (DEMADEX ) 20 MG tablet [id number] Yes Take 1 tablet (20 mg total) by mouth daily. McGowen, Aleene DEL, MD  Active Self, Pharmacy Records  Med List Note Carmella Asberry FALCON [facility] 06/15/20 1353): [place]  filled at [facility]            Goals Addressed             This Visit's Progress    [facility] Transitions of Care (TOC) Care Plan       Problems:  Recent Hospitalization for treatment of appendectomy 11/07/2023  Patient reports that she is doing well. Reports minimal pain. Has had hospital follow up with PCP . Will  see cardiology next week. 11/13/2023 continues to left lower abdominal tenderness,  No NVD. LBM  today.  CHF 11/07/2023 weight today of 172.7 pounds. No swelling.   11/13/2023  weight 168.4 pounds. No swelling. BP remain low. Meds on hold.  BP yesterday  110/40. No reading today.    Goal:  Over the next 30 days, the patient will not experience hospital readmission  Interventions:  Transitions of Care: Doctor Visits  - discussed the importance of doctor visits Post-op wound/incision care reviewed with patient/caregiver- reviewed that glue is still intact Reviewed Signs and symptoms of infection Assessed pain- pain is with movement on the left lower abdomen.  Denies any signs of infection- glue still on incisions Next transition of care call scheduled Assessed bowels are moving well.  Reviewed spasms that patient is having.   Heart Failure Interventions: Assessed need for readable accurate scales in home- reviewed recent weights and weight loss.  Discussed importance of daily weight and advised patient to weigh and record daily Reviewed role of diuretics in prevention of fluid overload and management of heart failure; Discussed the importance of keeping all appointments with provider  Surgery (appendectomy): Evaluation of current treatment plan related to appendectomy surgery reviewed medications with patient and addressed questions reviewed scheduled provider appointments with patient(  cardiology and surgeon) confirmed availability of transportation to all appointments yes Patient Self Care Activities:  Attend all scheduled provider appointments Call pharmacy for medication refills 3-7 days in advance of running out of medications Call provider office for new concerns or questions  Notify RN Care Manager of TOC call rescheduling needs Participate in Transition of Care Program/Attend TOC scheduled calls Take medications as prescribed   call office if I gain more than 2 pounds in  one day or 5 pounds in one week do ankle pumps when sitting track weight in diary use salt in moderation watch for swelling in feet, ankles and legs every day weigh myself daily Monitor for constipation Call MD for any signs of infection  Move around as much as possible  Plan:  Telephone follow up appointment with care management team member scheduled for:  11/20/2023  The patient has been provided with contact information for the care management team and has been advised to call with any health related questions or concerns.         Recommendation:   Continue Current Plan of Care  Follow Up Plan:   Telephone follow up appointment date/time:  11/20/2023  Alan Ee, RN, BSN, CEN Population Health- Transition of Care Team.  [facility] [phone]

## 2023-11-13 NOTE — Patient Instructions (Signed)
 Visit Information  Thank you for taking time to visit with me today. Please don't hesitate to contact me if I can be of assistance to you before our next scheduled telephone appointment.  Following are the goals we discussed today:   Goals Addressed             This Visit's Progress    [facility] Transitions of Care (TOC) Care Plan       Problems:  Recent Hospitalization for treatment of appendectomy 11/07/2023  Patient reports that she is doing well. Reports minimal pain. Has had hospital follow up with PCP . Will see cardiology next week. 11/13/2023 continues to left lower abdominal tenderness,  No NVD. LBM  today.  CHF 11/07/2023 weight today of 172.7 pounds. No swelling.   11/13/2023  weight 168.4 pounds. No swelling. BP remain low. Meds on hold.  BP yesterday  110/40. No reading today.    Goal:  Over the next 30 days, the patient will not experience hospital readmission  Interventions:  Transitions of Care: Doctor Visits  - discussed the importance of doctor visits Post-op wound/incision care reviewed with patient/caregiver- reviewed that glue is still intact Reviewed Signs and symptoms of infection Assessed pain- pain is with movement on the left lower abdomen.  Denies any signs of infection- glue still on incisions Next transition of care call scheduled Assessed bowels are moving well.  Reviewed spasms that patient is having.   Heart Failure Interventions: Assessed need for readable accurate scales in home- reviewed recent weights and weight loss.  Discussed importance of daily weight and advised patient to weigh and record daily Reviewed role of diuretics in prevention of fluid overload and management of heart failure; Discussed the importance of keeping all appointments with provider  Surgery (appendectomy): Evaluation of current treatment plan related to appendectomy surgery reviewed medications with patient and addressed questions reviewed scheduled provider appointments  with patient(  cardiology and surgeon) confirmed availability of transportation to all appointments yes Patient Self Care Activities:  Attend all scheduled provider appointments Call pharmacy for medication refills 3-7 days in advance of running out of medications Call provider office for new concerns or questions  Notify RN Care Manager of TOC call rescheduling needs Participate in Transition of Care Program/Attend TOC scheduled calls Take medications as prescribed   call office if I gain more than 2 pounds in one day or 5 pounds in one week do ankle pumps when sitting track weight in diary use salt in moderation watch for swelling in feet, ankles and legs every day weigh myself daily Monitor for constipation Call MD for any signs of infection  Move around as much as possible  Plan:  Telephone follow up appointment with care management team member scheduled for:  11/20/2023  The patient has been provided with contact information for the care management team and has been advised to call with any health related questions or concerns.          Our next appointment is by telephone on 11/20/2023  at noon  Please call the care guide team at [phone] if you need to cancel or reschedule your appointment.   If you are experiencing a Mental Health or Behavioral Health Crisis or need someone to talk to, please call the Suicide and Crisis Lifeline: 988 call the [place]  National Suicide Prevention Lifeline: 475-786-4065 or TTY: 262-823-5500 TTY 469-328-5852) to talk to a trained counselor call 1-800-273-TALK (toll free, 24 hour hotline) call 911   Care plan and visit instructions  communicated with the patient verbally today. Patient agrees to receive a copy in MyChart. Active MyChart status and patient understanding of how to access instructions and care plan via MyChart confirmed with patient.     Alan Ee, RN, BSN, CEN [employer]- Transition of Care Team.  [facility] [phone]

## 2023-11-14 ENCOUNTER — Inpatient Hospital Stay: Admitting: Family Medicine

## 2023-11-17 ENCOUNTER — Ambulatory Visit (HOSPITAL_COMMUNITY): Payer: Self-pay | Admitting: Family Medicine

## 2023-11-18 NOTE — Progress Notes (Signed)
 PROVIDER:  PUJA GOSAI MACZIS, PA  MRN: [id number] DOB: 04-17-1955 DATE OF ENCOUNTER: 11/18/2023 History of Present Illness:   Natalie Baker is a 68 y.o. 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 Lyndel.  Discharged post-op day 1.  She followed up with cardiology on 11/12/2023.  She states she has some sharp left left lower quadrant pains with movement but does not feel any major pain when sitting or lying still.  She does not take any pain medicine during the day.  She only takes half an Oxy at night to help with pain and sleep.  She denies nausea, vomiting, fever.  Denies constipation/diarrhea.  Review of Systems:   A complete review of systems was obtained from the patient.  I have reviewed this information and discussed as appropriate with the patient.  See HPI as well for other ROS.  ROS All other review of systems negative.  Medications:   Current Outpatient Medications on File Prior to Visit  Medication Sig Dispense Refill  . acalabrutinib  (CALQUENCE ) 100 mg capsule Take 100 mg by mouth 2 (two) times daily    . albuterol MDI, PROVENTIL, VENTOLIN, PROAIR, HFA 90 mcg/actuation inhaler INHALE 2 PUFFS EVERY 4 TO 6 HOURS AS NEEDED FOR SHORTNESS OF BREATH OR FOR WHEEZE    . amiodarone  (PACERONE ) 100 MG tablet Take 50 mg by mouth once daily    . apixaban  (ELIQUIS ) 5 mg tablet Take 5 mg by mouth 2 (two) times daily    . CALQUENCE , ACALABRUTINIB  MAL, 100 mg tablet     . carvediloL  (COREG ) 6.25 MG tablet carvedilol  6.25 mg tablet  TAKE 1 TABLET BY MOUTH TWICE A DAY    . estradiol  (VIVELLE -DOT) patch 0.05 mg/24 hr estradiol  0.05 mg/24 hr semiweekly transdermal patch  APPLY 1 PATCH TRANSDERMALLY TWICE A WEEK    . FARXIGA  10 mg tablet TAKE 1 TABLET BY  MOUTH DAILY BEFORE BREAKFAST.    . ivabradine  (CORLANOR ) 7.5 mg tablet Take 7.5 mg by mouth    . levothyroxine  (SYNTHROID ) 100 MCG tablet TAKE 1 TABLET BY MOUTH DAILY ON AN EMPTY STOMACH WITH ONLY WATER FOR 30 MINUTES & NO ANTACID/CALCIUM /MAGNESIUM  FOR 4 HOURS & AVOID BIOTIN    . losartan  (COZAAR ) 25 MG tablet losartan  25 mg tablet  TAKE 1 TABLET (25 MG TOTAL) BY MOUTH IN THE MORNING AND AT BEDTIME.    . ondansetron  (ZOFRAN -ODT) 4 MG disintegrating tablet Take 4 mg by mouth every 8 (eight) hours as needed    . oxyCODONE  (ROXICODONE ) 5 MG immediate release tablet Take 5 mg by mouth    . potassium chloride  (K-TAB ) 20 mEq TbER ER tablet potassium chloride  ER 20 mEq tablet,extended release  TAKE 1 TABLET BY MOUTH EVERY DAY    . pravastatin  (PRAVACHOL ) 20 MG tablet Take 20 mg by mouth once daily    . spironolactone  (ALDACTONE ) 25 MG tablet spironolactone  25 mg tablet    . thyroid  (ARMOUR THYROID ) 90 mg tablet Armour Thyroid  90 mg tablet  TAKE 1 TABLET BY MOUTH IN THE MORNING ON EMPTY STOMACH    . TORsemide  (DEMADEX ) 20 MG tablet torsemide  20 mg tablet  TAKE 1 TABLET BY MOUTH EVERY DAY     No current facility-administered medications on file prior to visit.    Physical Examination:  BP 104/55   Pulse 75   Temp 36.3 C (97.3 F)   Ht 157.5 cm (5' 2)   Wt 79 kg (174 lb 3.2 oz)   SpO2 97%   BMI 31.86 kg/m  General: Alert, oriented, in no acute distress Pulmonary: Effort normal Abdomen: Soft, non-distended abdomen.  Mild TTP on LLQ but NO TTP on RLQ. Incisions clean, dry, and intact without surrounding erythema and drainage  Assessment and Plan:   Diagnoses and all orders for this visit:  Status post laparoscopic appendectomy    Analyah L Baker is doing well and healing appropriately without signs of infection.  Pathology report reviewed with patient.  We discussed calling the office if she develops any new or worsening issues, but for now I think she is having normal post-op pain as  the pain is with movement and she is not having any fevers.  She will gradually increase activities as tolerated.    Follow-up as needed.   Puja Maczis, [facility]

## 2023-11-20 ENCOUNTER — Other Ambulatory Visit: Payer: Self-pay

## 2023-11-20 NOTE — Patient Instructions (Signed)
 Visit Information  Thank you for taking time to visit with me today. Please don't hesitate to contact me if I can be of assistance to you before our next scheduled telephone appointment.  Following are the goals we discussed today:   Goals Addressed             This Visit's Progress    [facility] Transitions of Care (TOC) Care Plan       Problems:  Recent Hospitalization for treatment of appendectomy 11/07/2023  Patient reports that she is doing well. Reports minimal pain. Has had hospital follow up with PCP . Will see cardiology next week. 11/13/2023 continues to left lower abdominal tenderness,  No NVD. LBM  today.   11/20/2023  patient reports doing well. Has had hospital follow up with surgeon. Goes back as needed. Continues to have left lower abd pain with movement.  CHF 11/07/2023 weight today of 172.7 pounds. No swelling.   11/13/2023  weight 168.4 pounds. No swelling. BP remain low. Meds on hold.  BP yesterday  110/40. No reading today. 11/20/2023   Weight remains under good control.  BP medications remain on hold. Has seen cardiology in the last week.    Goal:  Over the next 30 days, the patient will not experience hospital readmission  Interventions:  Transitions of Care: Doctor Visits  - discussed the importance of doctor visits Post-op wound/incision care reviewed with patient/caregiver- reviewed that glue is still intact Reviewed Signs and symptoms of infection Assessed pain- pain is with movement on the left lower abdomen.  Denies any signs of infection- glue still on incisions Next transition of care call scheduled Assessed bowels are moving well.  Reviewed spasms that patient is having.   Heart Failure Interventions: Assessed need for readable accurate scales in home- reviewed recent weights and weight loss.  Discussed importance of daily weight and advised patient to weigh and record daily Reviewed role of diuretics in prevention of fluid overload and management of heart  failure; Discussed the importance of keeping all appointments with provider Reviewed weights that are in range for patient. No swelling  Surgery (appendectomy): Completed - released from surgeon 11/20/2023 Patient Self Care Activities:  Attend all scheduled provider appointments Call pharmacy for medication refills 3-7 days in advance of running out of medications Call provider office for new concerns or questions  Notify RN Care Manager of TOC call rescheduling needs Participate in Transition of Care Program/Attend TOC scheduled calls Take medications as prescribed   call office if I gain more than 2 pounds in one day or 5 pounds in one week do ankle pumps when sitting track weight in diary use salt in moderation watch for swelling in feet, ankles and legs every day weigh myself daily Monitor for constipation Call MD for any signs of infection  Move around as much as possible  Plan:  Telephone follow up appointment with care management team member scheduled for:  11/27/2023  The patient has been provided with contact information for the care management team and has been advised to call with any health related questions or concerns.          Our next appointment is by telephone on 11/27/2023 at noon  Please call the care guide team at [phone] if you need to cancel or reschedule your appointment.   If you are experiencing a Mental Health or Behavioral Health Crisis or need someone to talk to, please call the Suicide and Crisis Lifeline: 988 call the [place]  National Suicide  Prevention Lifeline: (940)212-6307 or TTY: 936 500 2483 TTY 6137994463) to talk to a trained counselor call 1-800-273-TALK (toll free, 24 hour hotline) call 911   Care plan and visit instructions communicated with the patient verbally today. Patient agrees to receive a copy in MyChart. Active MyChart status and patient understanding of how to access instructions and care plan via MyChart confirmed  with patient.     Alan Ee, RN, BSN, CEN [employer]- Transition of Care Team.  [facility] [phone]

## 2023-11-20 NOTE — Transitions of Care (Post Inpatient/ED Visit) (Signed)
 Transition of Care week 4  Visit Note  11/20/2023  Name: Natalie Baker MRN: [id number]          DOB: July 13, 1955  Situation: Patient enrolled in [facility] 30-day program. Visit completed with paitent by telephone.   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 [facility] and others   Obesity    BMI 34   Osteoporosis    most recent DEXA 02/2022   Pre-diabetes    Secondary nonischemic congestive cardiomyopathy (HCC)    Adriamycin  2020   Ventricular tachycardia (HCC)    Vitamin D  deficiency     Assessment: Patient reports that she is doing well. Reports that she continues to have some left lower abdominal pain.  Reports that she did not have to take any pain medications last night. Reports normal bowel movements. Reports incision sites without signs of infection.   No new concerns today.  Patient Reported Symptoms: Cognitive Cognitive Status: Able to follow simple commands, Alert and oriented to person, place, and time, Normal speech and language skills      Neurological Neurological Review of Symptoms: Not assessed    HEENT HEENT Symptoms Reported: Not assessed      Cardiovascular Cardiovascular Symptoms Reported: No symptoms reported Does patient have uncontrolled Hypertension?: No Weight: 172 lb 9.6 oz (78.3 kg) Cardiovascular Self-Management Outcome: 5 (very good) Cardiovascular Comment: denies swelling or shortness of breath  Respiratory Respiratory Symptoms Reported: No symptoms reported    Endocrine  Endocrine Symptoms Reported: No symptoms reported Endocrine Self-Management Outcome: 5 (very good)  Gastrointestinal Gastrointestinal Symptoms Reported: Abdominal pain or discomfort Additional Gastrointestinal Details: left lower abdominal pain with movements.  reports normal bowel movement. denies NV Gastrointestinal Management Strategies: Medication therapy Gastrointestinal Self-Management Outcome: 5 (very good) Gastrointestinal Comment: pain controlled with medication    Genitourinary Genitourinary Symptoms Reported: No symptoms reported Genitourinary Self-Management Outcome: 5 (very good)  Integumentary Integumentary Symptoms Reported: Itching Additional Integumentary Details: glue is still attached.  Srurgeon happy with incision sited. Skin Management Strategies: Routine screening Skin Self-Management Outcome: 5 (very good)  Musculoskeletal Musculoskelatal Symptoms Reviewed: Other Other Musculoskeletal Symptoms: continues to have muscle spasm.  reports that this is not new.  Has had for years. Musculoskeletal Management Strategies: Routine screening      Psychosocial Psychosocial Symptoms Reported: No symptoms reported         Today's Vitals   11/20/23 1158 11/20/23 1205  Weight: 172 lb 9.6 oz (78.3 kg)   PainSc:  0-No pain     Medications Reviewed Today     Reviewed by Rumalda Alan PENNER, RN (Registered Nurse) on 11/20/23 at 1157  Med List Status: <None>   Medication Order Taking? Sig Documenting Provider Last Dose Status Informant  amiodarone  (PACERONE ) 100 MG tablet [id number] Yes Take 0.5 tablets (50 mg total) by mouth daily. [address] [place], [place]  Active   ELIQUIS  5 MG TABS tablet [id number] Yes TAKE 1 TABLET BY MOUTH TWICE A DAY Bensimhon, Toribio SAUNDERS, MD  Active Self, Pharmacy Records  estradiol  (CLIMARA  -  DOSED IN MG/24 HR) 0.0375 mg/24hr patch [id number] Yes Place 0.0375 mg onto the skin 2 (two) times a week. Tuesdays & Fridays [provider]  Active Self,  Pharmacy Records           Med Note RANELLE, KEENE HERO   Fri May 04, 2021  9:49 AM)    FARXIGA  10 MG TABS tablet [id number] Yes TAKE 1 TABLET BY MOUTH EVERY DAY Bensimhon, Toribio SAUNDERS, MD  Active Self, Pharmacy Records  ivabradine  (CORLANOR ) 7.5 MG TABS tablet [id number] Yes Take 1 tablet (7.5 mg total) by mouth 2 (two) times daily with a meal. Bensimhon, Toribio SAUNDERS, MD  Active Self, Pharmacy Records  levothyroxine  (SYNTHROID ) 100 MCG tablet [id number] Yes TAKE 1 TABLET BY MOUTH DAILY ON AN EMPTY STOMACH WITH ONLY WATER FOR 30 MINUTES & NO ANTACID/CALCIUM /MAGNESIUM  FOR 4 HOURS & AVOID BIOTIN McGowen, Aleene DEL, MD  Active   losartan  (COZAAR ) 25 MG tablet [id number]  Take 1 tablet (25 mg total) by mouth daily.  Patient not taking: Reported on 11/20/2023   Bensimhon, Toribio SAUNDERS, MD  Active Self, Pharmacy Records           Med Note DELETA DEBBY SAUNDERS Pablo Oct 27, 2023  5:26 PM) Recently increased to 25mg  daily but made her feel bad and cause insomnia.  oxyCODONE  (OXY IR/ROXICODONE ) 5 MG immediate release tablet [id number] Yes Take 1 tablet (5 mg total) by mouth every 6 (six) hours as needed for moderate pain (pain score 4-6) or severe pain (pain score 7-10). Davia Nydia POUR, MD  Active   potassium chloride  (KLOR-CON ) 10 MEQ tablet [id number] Yes 2 tabs po qd McGowen, Philip H, MD  Active Self, Pharmacy Records  spironolactone  (ALDACTONE ) 25 MG tablet [id number]  TAKE 1 TABLET BY MOUTH EVERY DAY  Patient not taking: Reported on 11/20/2023   Bensimhon, Daniel R, MD  Active Self, Pharmacy Records  temazepam  (RESTORIL ) 30 MG capsule [id number] Yes Take 1 capsule (30 mg total) by mouth at bedtime as needed for sleep. Sherrod Sherrod, MD  Active Self, Pharmacy Records  torsemide  (DEMADEX ) 20 MG tablet [id number] Yes Take 1 tablet (20 mg total) by mouth daily. McGowen, Aleene DEL, MD  Active Self, Pharmacy Records  Med List Note Carmella Asberry FALCON [facility] 06/15/20 1353): Calquence  filled at [facility]             Recommendation:   Continue Current Plan of Care  Follow Up Plan:   Telephone follow up appointment date/time:  11/27/2023  at 1200 noon  Alan Ee, RN, BSN, [employer]- Transition of Care Team.  [facility] [phone]

## 2023-11-26 ENCOUNTER — Ambulatory Visit

## 2023-11-27 ENCOUNTER — Other Ambulatory Visit: Payer: Self-pay

## 2023-11-27 NOTE — Patient Outreach (Signed)
 Transition of Care week 5  Visit Note  11/27/2023  Name: Natalie Baker MRN: [id number]          DOB: Aug 15, 1955  Situation: Patient enrolled in [facility] 30-day program. Visit completed with patient by telephone.   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 [facility] and others   Obesity    BMI 34   Osteoporosis    most recent DEXA 02/2022   Pre-diabetes    Secondary nonischemic congestive cardiomyopathy (HCC)    Adriamycin  2020   Ventricular tachycardia (HCC)    Vitamin D  deficiency     Assessment:Patient reports that she is feeling well and is self managing without difficulty.  Patient Reported Symptoms: Cognitive Cognitive Status: Able to follow simple commands, Alert and oriented to person, place, and time, Normal speech and language skills      Neurological Neurological Review of Symptoms: No symptoms reported    HEENT HEENT Symptoms Reported: No symptoms reported      Cardiovascular Cardiovascular Symptoms Reported: No symptoms reported Weight: 170 lb 6.4 oz (77.3 kg) (home monitoring.)  Respiratory Respiratory Symptoms Reported: No symptoms reported Respiratory Management Strategies: Routine screening Respiratory Self-Management Outcome: 5 (very good)  Endocrine Endocrine Symptoms Reported: No symptoms reported Is patient diabetic?: No Endocrine Self-Management Outcome: 5 (very good)  Gastrointestinal Additional Gastrointestinal Details: left lower abdominal tenderness to touch.  no pain  medications. Bowels moving well. Gastrointestinal Management Strategies: Medication therapy Gastrointestinal Self-Management Outcome: 5 (very good)    Genitourinary Genitourinary Symptoms Reported: No symptoms reported    Integumentary Additional Integumentary Details: reports glue still attached.    Musculoskeletal Musculoskelatal Symptoms Reviewed: Other Other Musculoskeletal Symptoms: decrease in muscle spasms.        Psychosocial Psychosocial Symptoms Reported: Not assessed         Today's Vitals   11/27/23 1201  Weight: 170 lb 6.4 oz (77.3 kg)   Pain Scale: 0-10 Pain Score: 0-No pain  Medications Reviewed Today     Reviewed by Rumalda Alan PENNER, RN (Registered Nurse) on 11/27/23 at 1202  Med List Status: <None>   Medication Order Taking? Sig Documenting Provider Last Dose Status Informant  amiodarone  (PACERONE ) 100 MG tablet [id number]  Take 0.5 tablets (50 mg total) by mouth daily. [place], [place], [place]  Active   ELIQUIS  5 MG TABS tablet [id number]  TAKE 1 TABLET BY MOUTH TWICE A DAY Bensimhon, Toribio SAUNDERS, MD  Active Self, Pharmacy Records  estradiol  (CLIMARA  - DOSED IN MG/24 HR) 0.0375 mg/24hr patch [id number]  Place 0.0375 mg onto the skin 2 (two) times a week. Tuesdays & Fridays [provider]  Active Self, Pharmacy Records           Med Note RANELLE KEENE HERO   Fri May 04, 2021  9:49 AM)    FARXIGA  10 MG TABS tablet [id number]  TAKE 1 TABLET BY MOUTH EVERY DAY Bensimhon, Toribio SAUNDERS, MD  Active Self, Pharmacy Records  ivabradine  (CORLANOR ) 7.5 MG TABS tablet [id number]  Take 1 tablet (7.5  mg total) by mouth 2 (two) times daily with a meal. Bensimhon, Toribio SAUNDERS, MD  Active Self, Pharmacy Records  levothyroxine  (SYNTHROID ) 100 MCG tablet [id number]  TAKE 1 TABLET BY MOUTH DAILY ON AN EMPTY STOMACH WITH ONLY WATER FOR 30 MINUTES & NO ANTACID/CALCIUM /MAGNESIUM  FOR 4 HOURS & AVOID BIOTIN McGowen, Aleene DEL, MD  Active   losartan  (COZAAR ) 25 MG tablet [id number]  Take 1 tablet  (25 mg total) by mouth daily.  Patient not taking: Reported on 11/20/2023   Bensimhon, Toribio SAUNDERS, MD  Active Self, Pharmacy Records           Med Note DELETA DEBBY SAUNDERS Pablo Oct 27, 2023  5:26 PM) Recently increased to 25mg  daily but made her feel bad and cause insomnia.  oxyCODONE  (OXY IR/ROXICODONE ) 5 MG immediate release tablet [id number]  Take 1 tablet (5 mg total) by mouth every 6 (six) hours as needed for moderate pain (pain score 4-6) or severe pain (pain score 7-10). Davia Nydia POUR, MD  Active   potassium chloride  (KLOR-CON ) 10 MEQ tablet [id number]  2 tabs po qd McGowen, Philip H, MD  Active Self, Pharmacy Records  spironolactone  (ALDACTONE ) 25 MG tablet [id number]  TAKE 1 TABLET BY MOUTH EVERY DAY  Patient not taking: Reported on 11/20/2023   BensimhonToribio SAUNDERS, MD  Active Self, Pharmacy Records  temazepam  (RESTORIL ) 30 MG capsule [id number]  Take 1 capsule (30 mg total) by mouth at bedtime as needed for sleep. Sherrod Sherrod, MD  Active Self, Pharmacy Records  torsemide  (DEMADEX ) 20 MG tablet [id number]  Take 1 tablet (20 mg total) by mouth daily. McGowen, Aleene DEL, MD  Active Self, Pharmacy Records  Med List Note Carmella Asberry [facility]CON [facility] 06/15/20 1353): [place]ence  filled at [facility]               Goals Addressed             This Visit's Progress    [facility] Transitions of Care (TOC) Care Plan       Problems:  Recent Hospitalization for treatment of appendectomy 11/07/2023  Patient reports that she is doing well. Reports minimal pain. Has had hospital follow up with PCP . Will see cardiology next week. 11/13/2023 continues to left lower abdominal tenderness,  No NVD. LBM  today.   11/20/2023  patient reports doing well. Has had hospital follow up with surgeon. Goes back as needed. Continues to have left lower abd pain with movement. 11/27/2023  Continues to have tenderness but no pain. No longer taking pain medications.  CHF 11/07/2023 weight today of 172.7 pounds. No swelling.    11/13/2023  weight 168.4 pounds. No swelling. BP remain low. Meds on hold.  BP yesterday  110/40. No reading today. 11/20/2023   Weight remains under good control.  BP medications remain on hold. Has seen cardiology in the last week.   11/27/2023  Reports weight is stable and has no swelling. Pending future cardiology follow ups.         Goal:  Over the next 30 days, the patient will not experience hospital readmission  Interventions:  Transitions of Care: Doctor Visits  - discussed the importance of doctor visits Post-op wound/incision care reviewed with patient/caregiver- reviewed that glue is still intact Reviewed Signs and symptoms of infection Reassessed pain- 0 Denies any signs of infection- glue still on incisions Assessed bowels are moving well.  Reviewed spasms that patient is having.  Reviewed successful completion of TOC program. Offered CCM services and  patient declined.   Heart Failure Interventions: Assessed need for readable accurate scales in home- reviewed recent weights and weight loss.  Discussed importance of daily weight and advised patient to weigh and record daily Reviewed role of diuretics in prevention of fluid overload and management of heart failure; Discussed the importance of keeping all appointments with provider Reviewed weights that are in range for patient. No swelling  Surgery (appendectomy): Completed - released from surgeon 11/20/2023 Patient Self Care Activities:  Attend all scheduled provider appointments Call pharmacy for medication refills 3-7 days in advance of running out of medications Call provider office for new concerns or questions  Take medications as prescribed   call office if I gain more than 2 pounds in one day or 5 pounds in one week do ankle pumps when sitting track weight in diary use salt in moderation watch for swelling in feet, ankles and legs every day weigh myself daily Monitor for constipation Call MD for any signs of  infection  Move around as much as possible  Plan: Patient has successfully completed TOC program.        Recommendation:   Continue Current Plan of Care  Follow Up Plan:   Closing From:  Transitions of Care Program  Alan Ee, RN, BSN, [employer] Population Health- Transition of Care Team.  [facility] [phone]

## 2023-11-27 NOTE — Patient Instructions (Signed)
 Visit Information  Thank you for taking time to visit with me today.   Following are the goals we discussed today:   Goals Addressed             This Visit's Progress    COMPLETED: [facility] Transitions of Care (TOC) Care Plan       Problems:  Recent Hospitalization for treatment of appendectomy 11/07/2023  Patient reports that she is doing well. Reports minimal pain. Has had hospital follow up with PCP . Will see cardiology next week. 11/13/2023 continues to left lower abdominal tenderness,  No NVD. LBM  today.   11/20/2023  patient reports doing well. Has had hospital follow up with surgeon. Goes back as needed. Continues to have left lower abd pain with movement. 11/27/2023  Continues to have tenderness but no pain. No longer taking pain medications.  CHF 11/07/2023 weight today of 172.7 pounds. No swelling.   11/13/2023  weight 168.4 pounds. No swelling. BP remain low. Meds on hold.  BP yesterday  110/40. No reading today. 11/20/2023   Weight remains under good control.  BP medications remain on hold. Has seen cardiology in the last week.   11/27/2023  Reports weight is stable and has no swelling. Pending future cardiology follow ups.         Goal:  Over the next 30 days, the patient will not experience hospital readmission  Interventions:  Transitions of Care: Doctor Visits  - discussed the importance of doctor visits Post-op wound/incision care reviewed with patient/caregiver- reviewed that glue is still intact Reviewed Signs and symptoms of infection Reassessed pain- 0 Denies any signs of infection- glue still on incisions Assessed bowels are moving well.  Reviewed spasms that patient is having.  Reviewed successful completion of TOC program. Offered CCM services and patient declined.   Heart Failure Interventions: Assessed need for readable accurate scales in home- reviewed recent weights and weight loss.  Discussed importance of daily weight and advised patient to weigh and  record daily Reviewed role of diuretics in prevention of fluid overload and management of heart failure; Discussed the importance of keeping all appointments with provider Reviewed weights that are in range for patient. No swelling  Surgery (appendectomy): Completed - released from surgeon 11/20/2023 Patient Self Care Activities:  Attend all scheduled provider appointments Call pharmacy for medication refills 3-7 days in advance of running out of medications Call provider office for new concerns or questions  Take medications as prescribed   call office if I gain more than 2 pounds in one day or 5 pounds in one week do ankle pumps when sitting track weight in diary use salt in moderation watch for swelling in feet, ankles and legs every day weigh myself daily Monitor for constipation Call MD for any signs of infection  Move around as much as possible  Plan: Patient has successfully completed TOC program.          If you are experiencing a Mental Health or Behavioral Health Crisis or need someone to talk to, please      Alan Ee, RN, BSN, [facility] Population Health- Transition of Care Team.  [facility] [phone]

## 2023-12-11 ENCOUNTER — Inpatient Hospital Stay: Attending: Internal Medicine

## 2023-12-11 DIAGNOSIS — Z8572 Personal history of non-Hodgkin lymphomas: Secondary | ICD-10-CM | POA: Insufficient documentation

## 2023-12-15 ENCOUNTER — Ambulatory Visit (HOSPITAL_COMMUNITY)
Admission: RE | Admit: 2023-12-15 | Discharge: 2023-12-15 | Disposition: A | Source: Ambulatory Visit | Attending: Internal Medicine

## 2023-12-15 ENCOUNTER — Encounter: Payer: Self-pay | Admitting: Internal Medicine

## 2023-12-15 ENCOUNTER — Ambulatory Visit (HOSPITAL_COMMUNITY)

## 2023-12-15 ENCOUNTER — Encounter: Payer: Medicare Other | Admitting: Nurse Practitioner

## 2023-12-15 VITALS — BP 126/78 | HR 92 | Wt 178.0 lb

## 2023-12-15 DIAGNOSIS — I5022 Chronic systolic (congestive) heart failure: Secondary | ICD-10-CM

## 2023-12-15 LAB — BASIC METABOLIC PANEL WITH GFR
Anion gap: 10 (ref 5–15)
BUN: 13 mg/dL (ref 8–23)
CO2: 30 mmol/L (ref 22–32)
Calcium: 9 mg/dL (ref 8.9–10.3)
Chloride: 98 mmol/L (ref 98–111)
Creatinine, Ser: 1.24 mg/dL — ABNORMAL HIGH (ref 0.44–1.00)
GFR, Estimated: 47 mL/min — ABNORMAL LOW
Glucose, Bld: 94 mg/dL (ref 70–99)
Potassium: 3 mmol/L — ABNORMAL LOW (ref 3.5–5.1)
Sodium: 138 mmol/L (ref 135–145)

## 2023-12-15 LAB — BRAIN NATRIURETIC PEPTIDE: B Natriuretic Peptide: 468.1 pg/mL — ABNORMAL HIGH (ref 0.0–100.0)

## 2023-12-15 MED ORDER — SPIRONOLACTONE 25 MG PO TABS: 12.5000 mg | ORAL_TABLET | Freq: Every day | ORAL | 3 refills | Status: DC

## 2023-12-15 NOTE — Progress Notes (Cosign Needed Addendum)
 Advanced Heart Failure Clinic Note  PCP: [name], MD PCP-Cardiologist: None HF-Cardiologist: Toribio Fuel, MD  HPI:  Natalie Baker is a 68 y.o. female with a hx of palpitations thought to be PVCs, HTN, non hodgkins lymphoma treated w/ R-CHOP and systolic HF.   Admitted 06/2019 with acute HF. ECHO  EF 20-25% w/ mod-severe MR.  RV moderately reduced. Developed VT and was started on amiodarone  and transferred to [facility] for [facility] which showed normal coronaries, elevated filling pressures w/ low CO c/w cardiogenic shock. CI was 1.7. She was started on milrinone , IV amiodarone  and IV Lasix  for diuresis.   Echo 09/2019: EF 40-45%   Echo 04/2020 EF 50-55% G2DD.    At oncology f/u 06/2020,  repeat CT scan unfortunately showed evidence for disease progression with mild increase in the small bilateral axillary lymph nodes and moderate increase in the left supraclavicular as well as ileocolic mesenteric lymph nodes and left external iliac lymph nodes but mild increase in the periaortic retroperitoneal lymph nodes. She started treatment with acalabrutinib  100 mg p.o. twice daily on June 17 2020 and Dr. Sherrod referred her to [facility] for bone marrow transplant evaluation.   cMRI 11/2020 LVEF 39%, no LV thrombus noted.   Follow up 11/2020 Not a candidate for stem cell transplant, but started CAR-T therapy   Echo 01/2021 with EF 40-45%. after CAR-T therapy   Admitted from HF clinic on 03/07/21. Echo EF < 20%. PICC line placed. Initial co-ox 34% and she was started on inotrope support with milrinone . Echo 03/2021 EF 20-25%    Echo 06/2021 EF 35-40% (read as 30-35%) severe MR  TEE 06/2021 EF 20-25% severe MR moderate.  Echo 05/2022  EF 35%, RV mildly reduced, mild to moderate MR Echo 06/2023 EF 35-40% moderate MR   Admitted 10/2023 with acute appendicitis. Cards consulted for pre-op CV risk stratification. s/p laparoscopic appendectomy 10/29/23. Had mild AKI and GDMT (Farxiga , losartan  and spiro)  held. Discharged home, weight 172 lbs.  Seen by MD for post-hospital follow-up on 11/12/23. Amiodarone  was decreased.   Brain fog, tired allt he time  Today Natalie Baker returns to Heart Failure Clinic for pharmacist medication titration. Reports feeling overall feeling much better since stopping losartan  and spironolactone . While taking them, she felt brain fog specifically in the AM and chronic fatigue. Denies dizziness lightheadedness fatigue chest pain palpitations SOB LEE orthopnea orthostasis PND. Reports being able to complete all activities of daily living (ADLs). Is somewhat active throughout the day. Weight at home is reportedly168 pounds. Takes torsemide  20 mg daily. Appetite has improved. Does follow a low sodium diet.  Current Heart Failure Medications: Loop diuretic: torsemide  20 mg daily Beta-Blocker: none ACEI/ARB/ARNI: none (losartan  25 mg daily held for hypotension) MRA: none (spironolactone  25 mg daily held for hypotension) SGLT2i: Farxiga  10 mg daily Other: ivabradine  7.5 mg BID  Has the patient been experiencing any side effects to the medications prescribed? No. Brain fog and fatigue resolved with holding losartan  and spironolactone .  Does the patient have any problems obtaining medications due to transportation or finances? No  Understanding of regimen: Excellent  Understanding of indications: Excellent  Potential of adherence: Excellent  Patient understands to avoid NSAIDs.  Patient understands to avoid decongestants.  Pertinent Lab Values: Creatinine  Date Value Ref Range Status  10/23/2023 1.42 (H) 0.44 - 1.00 mg/dL Final   Creat  Date Value Ref Range Status  12/13/2022 1.06 (H) 0.50 - 1.05 mg/dL Final   Creatinine, Ser  Date  Value Ref Range Status  12/15/2023 1.24 (H) 0.44 - 1.00 mg/dL Final   BUN  Date Value Ref Range Status  12/15/2023 13 8 - 23 mg/dL Final   Potassium  Date Value Ref Range Status  12/15/2023 3.0 (L) 3.5 - 5.1 mmol/L  Final   Sodium  Date Value Ref Range Status  12/15/2023 138 135 - 145 mmol/L Final   B Natriuretic Peptide  Date Value Ref Range Status  09/18/2023 161.8 (H) 0.0 - 100.0 pg/mL Final    Comment:    Performed at [facility], [address], [place], [place] [address]   Magnesium   Date Value Ref Range Status  10/29/2023 1.9 1.7 - 2.4 mg/dL Final    Comment:    Performed at [facility], [address] [place], [place] [address]   Digoxin  Level  Date Value Ref Range Status  03/26/2021 0.5 (L) 0.8 - 2.0 ng/mL Final    Comment:    Performed at [facility], [address], [place], [place] [address]   TSH  Date Value Ref Range Status  09/18/2023 0.712 0.350 - 4.500 uIU/mL Final    Comment:    Performed by a 3rd Generation assay with a functional sensitivity of <=0.01 uIU/mL. Performed at [facility], [address], [place], [place] [address]   07/07/2023 0.29 (L) 0.35 - 5.50 uIU/mL Final    Vital Signs: Today's Vitals   12/15/23 1407  BP: 126/78  Pulse: 92  SpO2: 94%  Weight: 178 lb (80.7 kg)    Assessment/Plan: 1. Chronic Systolic HF  - Echo 01/2019 EF 44-39%. RV normal - Echo 06/2019 EF 20-25%  moderately reduced RV function with severe MR/TR - Cath 06/25/19 no CAD. - Drop in EF felt most likely from R-CHOP - Echo 05/04/20 w/ EF 50-55% G2DD. RV normal - Echo 09/2020 at [facility] EF back down to 40% w/ ? LV thrombus - 12/2020 [facility]-T [facility] - cMRI 11/2020 LVEF 39%, no LV clot - Echo 01/2021 [facility]): EF 40-45% - Echo 03/2021: EF 20-25%, RV mildly reduced, RVSP 44 mmHg - Direct admit from clinic 03/07/21  Repeat echo, EF 20%. 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 Required milrinone  for low-output.  - Echo 06/29/21: EF improved 35-40% (read as 30-35%) with new severe MR - TEE 07/04/21: EF 20-25% severe MR moderate - Echo 05/2022: EF 35%, RV mildly reduced, mild to moderate MR - Echo 06/2023: EF 35-40% mod MR  - Stable NYHA II symptoms. Appears  euvolemic on exam, though weight up 6 lbs and BNP 468.1. GDMT limited by AKI and low BP  - Continue torsemide  20 mg daily + 20 KCL daily. Spironolactone  should help to increase K and eliminate some additional volume. - Continue to hold losartan . Losartan  is a more likely cause of her symptoms given its greater BP lowering effect compared to spironolactone . She also administered it at bedtime and woke up with symptoms prior to taking spironolactone . - Restart spironolactone  12.5 mg daily. BMET today with K of 3 and Scr down to 1.24. Repeat in 1 week.  - Continue Farxiga  10 mg daily. No GU symptoms. - Continue ivabradine  7.5 mg bid. BB not previously added due to low output HF requiring milrinone .  - Labs reviewed from today, K 3.0 and Scr down to 1.2 . BNP up slightly.    2. H/o VT + PVCs - Quiescent - Continue amiodarone  50 mg daily - Amio labs UTD   3. Severe  MR  - likely functional - TEE 07/04/21 EF 20-25% severe MR moderate - Echo 6/25 moderate MR   4. Stage IV non-Hodgkin's lymphoma, mantle cell lymphoma   - s/p R-CHOP w/ Neulasta . - Evidence of disease progression on f/u Chest CT 06/2020. - s/p CAR-T therapy in 12/2020 - Followed at [facility] with watchful waiting    5. Partial Lt Brachial DVT (provoked) - Diagnosed 03/13/21 - Continue Eliquis  5 mg bid. No bleeding issues   6. HL - LDL 155  - Felt bad on Crestor .  - Per PCP    7. QT prolonged - QTc 649 msec on ECG last visit, Compazine  and Zofran  stopped.    Follow up: APP clinic on 12/29/23  Please do not hesitate to reach out with questions or concerns,  Jaun Bash, PharmD, CPP, BCPS, [facility] Heart Failure Pharmacist  Phone - [phone] 12/15/2023 3:58 PM

## 2023-12-16 ENCOUNTER — Telehealth: Payer: Self-pay

## 2023-12-16 ENCOUNTER — Other Ambulatory Visit: Payer: Self-pay

## 2023-12-16 DIAGNOSIS — C8318 Mantle cell lymphoma, lymph nodes of multiple sites: Secondary | ICD-10-CM

## 2023-12-16 NOTE — Telephone Encounter (Signed)
 Returned pts call regarding her my-chart message.  Per Dr Sherrod we will order CT scan.  Pt verbalized understanding.

## 2023-12-22 ENCOUNTER — Other Ambulatory Visit (HOSPITAL_COMMUNITY): Payer: Self-pay

## 2023-12-22 DIAGNOSIS — Z8572 Personal history of non-Hodgkin lymphomas: Secondary | ICD-10-CM | POA: Diagnosis present

## 2023-12-22 DIAGNOSIS — I5022 Chronic systolic (congestive) heart failure: Secondary | ICD-10-CM

## 2023-12-23 ENCOUNTER — Ambulatory Visit (HOSPITAL_COMMUNITY): Admission: RE | Admit: 2023-12-23 | Discharge: 2023-12-23 | Attending: Internal Medicine | Admitting: Internal Medicine

## 2023-12-23 DIAGNOSIS — C8318 Mantle cell lymphoma, lymph nodes of multiple sites: Secondary | ICD-10-CM | POA: Insufficient documentation

## 2023-12-23 DIAGNOSIS — C8310 Mantle cell lymphoma, unspecified site: Secondary | ICD-10-CM

## 2023-12-23 MED ORDER — IOHEXOL 300 MG/ML  SOLN
60.0000 mL | Freq: Once | INTRAMUSCULAR | Status: AC | PRN
  Administered 2023-12-23: 17:00:00 60 mL via INTRAVENOUS

## 2023-12-24 ENCOUNTER — Encounter (HOSPITAL_COMMUNITY): Payer: Self-pay

## 2023-12-24 ENCOUNTER — Ambulatory Visit (HOSPITAL_COMMUNITY): Admission: RE | Admit: 2023-12-24 | Discharge: 2023-12-24 | Attending: Cardiology

## 2023-12-24 DIAGNOSIS — I5022 Chronic systolic (congestive) heart failure: Secondary | ICD-10-CM

## 2023-12-24 LAB — BASIC METABOLIC PANEL WITH GFR
Anion gap: 13 (ref 5–15)
BUN: 18 mg/dL (ref 8–23)
CO2: 30 mmol/L (ref 22–32)
Calcium: 9.8 mg/dL (ref 8.9–10.3)
Chloride: 95 mmol/L — ABNORMAL LOW (ref 98–111)
Creatinine, Ser: 1.14 mg/dL — ABNORMAL HIGH (ref 0.44–1.00)
GFR, Estimated: 52 mL/min — ABNORMAL LOW (ref >60)
Glucose, Bld: 75 mg/dL (ref 70–99)
Potassium: 3.6 mmol/L (ref 3.5–5.1)
Sodium: 137 mmol/L (ref 135–145)

## 2023-12-26 ENCOUNTER — Telehealth (HOSPITAL_COMMUNITY): Payer: Self-pay

## 2023-12-26 NOTE — Telephone Encounter (Signed)
 Called to confirm/remind patient of their appointment at the Advanced Heart Failure Clinic on 12/29/23.   Appointment:   [x] Confirmed  [] Left mess   [] No answer/No voice mail  [] VM Full/unable to leave message  [] Phone not in service  Patient reminded to bring all medications and/or complete list.  Confirmed patient has transportation. Gave directions, instructed to utilize valet parking.

## 2023-12-29 ENCOUNTER — Encounter (HOSPITAL_COMMUNITY): Payer: Self-pay

## 2023-12-29 ENCOUNTER — Ambulatory Visit (HOSPITAL_COMMUNITY)
Admission: RE | Admit: 2023-12-29 | Discharge: 2023-12-29 | Disposition: A | Discharge status: Home / Self Care | Source: Ambulatory Visit | Attending: Adult Health | Admitting: Adult Health

## 2023-12-29 ENCOUNTER — Ambulatory Visit (HOSPITAL_COMMUNITY): Payer: Self-pay | Admitting: Adult Health

## 2023-12-29 VITALS — BP 104/68 | HR 71 | Wt 179.4 lb

## 2023-12-29 DIAGNOSIS — E785 Hyperlipidemia, unspecified: Secondary | ICD-10-CM | POA: Insufficient documentation

## 2023-12-29 DIAGNOSIS — I5022 Chronic systolic (congestive) heart failure: Secondary | ICD-10-CM | POA: Diagnosis not present

## 2023-12-29 DIAGNOSIS — I110 Hypertensive heart disease with heart failure: Secondary | ICD-10-CM | POA: Diagnosis present

## 2023-12-29 DIAGNOSIS — Z7984 Long term (current) use of oral hypoglycemic drugs: Secondary | ICD-10-CM | POA: Diagnosis not present

## 2023-12-29 DIAGNOSIS — Z9221 Personal history of antineoplastic chemotherapy: Secondary | ICD-10-CM | POA: Insufficient documentation

## 2023-12-29 DIAGNOSIS — I340 Nonrheumatic mitral (valve) insufficiency: Secondary | ICD-10-CM | POA: Insufficient documentation

## 2023-12-29 DIAGNOSIS — Z86718 Personal history of other venous thrombosis and embolism: Secondary | ICD-10-CM | POA: Diagnosis not present

## 2023-12-29 DIAGNOSIS — Z79899 Other long term (current) drug therapy: Secondary | ICD-10-CM | POA: Diagnosis not present

## 2023-12-29 DIAGNOSIS — Z8572 Personal history of non-Hodgkin lymphomas: Secondary | ICD-10-CM | POA: Insufficient documentation

## 2023-12-29 DIAGNOSIS — I493 Ventricular premature depolarization: Secondary | ICD-10-CM | POA: Insufficient documentation

## 2023-12-29 DIAGNOSIS — Z7901 Long term (current) use of anticoagulants: Secondary | ICD-10-CM | POA: Insufficient documentation

## 2023-12-29 DIAGNOSIS — I4581 Long QT syndrome: Secondary | ICD-10-CM | POA: Insufficient documentation

## 2023-12-29 DIAGNOSIS — I472 Ventricular tachycardia, unspecified: Secondary | ICD-10-CM | POA: Diagnosis not present

## 2023-12-29 MED ORDER — SPIRONOLACTONE 25 MG PO TABS: 25.0000 mg | ORAL_TABLET | Freq: Every day | ORAL | 3 refills | Status: AC

## 2023-12-29 NOTE — Patient Instructions (Signed)
 Medication Changes:  INCREASE SPIRONOLACTONE  TO 25MG  ONCE DAILY   Lab Work:  LABS IN 7 DAYS AS SCHEDULED   Follow-Up in: 8 WEEKS AS SCHEDULED WITH DR. CHERRIE   At the Advanced Heart Failure Clinic, you and your health needs are our priority. We have a designated team specialized in the treatment of Heart Failure. This Care Team includes your primary Heart Failure Specialized Cardiologist (physician), Advanced Practice Providers (APPs- Physician Assistants and Nurse Practitioners), and Pharmacist who all work together to provide you with the care you need, when you need it.   You may see any of the following providers on your designated Care Team at your next follow up:  Dr. Toribio Cherrie Dr. Ezra Shuck Dr. Odis Brownie Greig Mosses, NP Caffie Shed, [place] [facility] [place], [place] Beckey Coe, NP Jordan Lee, NP Tinnie Redman, PharmD   Please be sure to bring in all your medications bottles to every appointment.   Need to Contact Us :  If you have any questions or concerns before your next appointment please send us  a message through [place] or call our office at [phone].    TO LEAVE A MESSAGE FOR THE NURSE SELECT OPTION 2, PLEASE LEAVE A MESSAGE INCLUDING: YOUR NAME DATE OF BIRTH CALL BACK NUMBER REASON FOR CALL**this is important as we prioritize the call backs  YOU WILL RECEIVE A CALL BACK THE SAME DAY AS LONG AS YOU CALL BEFORE 4:00 PM

## 2023-12-29 NOTE — Progress Notes (Signed)
 "  Advanced Heart Failure Clinic Note   PCP: [name], MD HF Cardiologist: Dr. Cherrie   HPI: Natalie Baker is a 68 y.o. female with a hx of palpitations thought to be PVCs, HTN, non hodgkins lymphoma treated w/ R-CHOP and chronic HFrEF.    Admitted 6/21 with acute HF. ECHO  EF 20-25% w/ mod-severe MR.  RV moderately reduced. Developed VT and was started on amiodarone  and transferred to [facility] for [facility] which showed normal coronaries, elevated filling pressures w/ low CO c/w cardiogenic shock. CI was 1.7. She was started on milrinone , IV amiodarone  and IV Lasix  for diuresis.   At oncology f/u 6/22,  repeat CT scan unfortunately showed evidence for disease progression with mild increase in the small bilateral axillary lymph nodes and moderate increase in the left supraclavicular as well as ileocolic mesenteric lymph nodes and left external iliac lymph nodes but mild increase in the periaortic retroperitoneal lymph nodes. She started treatment with acalabrutinib  100 mg p.o. twice daily on June 17 2020 and Dr. Sherrod referred her to [facility] for bone marrow transplant evaluation.   Follow up 11/22 Not a candidate for stem cell transplant, but started CAR-T therapy   Admitted from HF clinic on 03/07/21. Echo EF < 20%. PICC line placed. Initial co-ox 34% and she was started on inotrope support with milrinone . Echo 3/23 EF 20-25%    Admitted 10/25 with acute appendicitis. Cards consulted for pre-op CV risk stratification. s/p laparoscopic appendectomy 10/29/23. Had mild AKI and GDMT (Farxiga , losartan  and spiro) held. Discharged home, weight 172 lbs.  Saw pharmacist 12/15/23. She was placed back on 12.5 mg spiro. She feels much better off losartan  and has been tolerating Spironolactone .   Today ahe returns for HF follow up.Overall feeling fine. Denies SOB/PND/Orthopnea. Mild fatigue if she over exerts. Able to walk up driveway 0.6 miles. Appetite ok. No fever or chills. Taking all  medications.  Cardiac Test Echo 9/21: EF 40-45%  Echo 05/04/20 EF 50-55% G2DD.  Echo 1/23 with EF 40-45%. after CAR-T therapy Echo 06/29/21 EF 35-40% (read as 30-35%) severe MR  TEE 07/04/21 EF 20-25% severe MR moderate.  Echo 5/24  EF 35%, RV mildly reduced, mild to moderate MR Echo 6/25 EF 35-40% moderate MR cMRI 11/22 LVEF 39%, no LV thrombus noted.  Review of systems complete and found to be negative unless listed in HPI.    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 CHOP and others   Obesity    BMI 34   Osteoporosis    most recent DEXA 02/2022   Pre-diabetes    Secondary nonischemic congestive cardiomyopathy (HCC)    Adriamycin  2020   Ventricular tachycardia (HCC)    Vitamin D  deficiency    Current Outpatient Medications  Medication Sig Dispense Refill   amiodarone  (PACERONE ) 100 MG tablet Take 0.5 tablets (50 mg total) by mouth daily. 90 tablet 1   cetirizine (ZYRTEC) 10 MG chewable tablet Chew 10 mg by mouth daily.     ELIQUIS  5 MG TABS tablet TAKE 1 TABLET BY MOUTH TWICE A DAY 60 tablet 6   estradiol  (  CLIMARA  - DOSED IN MG/24 HR) 0.0375 mg/24hr patch Place 0.0375 mg onto the skin 2 (two) times a week. Tuesdays & Fridays     FARXIGA  10 MG TABS tablet TAKE 1 TABLET BY MOUTH EVERY DAY 30 tablet 11   ivabradine  (CORLANOR ) 7.5 MG TABS tablet Take 1 tablet (7.5 mg total) by mouth 2 (two) times daily with a meal. 60 tablet 11   levothyroxine  (SYNTHROID ) 100 MCG tablet TAKE 1 TABLET BY MOUTH DAILY ON AN EMPTY STOMACH WITH ONLY WATER FOR 30 MINUTES & NO ANTACID/CALCIUM /MAGNESIUM  FOR 4 HOURS & AVOID BIOTIN 90 tablet 3   potassium chloride   (KLOR-CON ) 10 MEQ tablet 2 tabs po qd     spironolactone  (ALDACTONE ) 25 MG tablet Take 0.5 tablets (12.5 mg total) by mouth daily. 90 tablet 3   temazepam  (RESTORIL ) 30 MG capsule Take 1 capsule (30 mg total) by mouth at bedtime as needed for sleep. 30 capsule 0   torsemide  (DEMADEX ) 20 MG tablet Take 1 tablet (20 mg total) by mouth daily.     [Paused] losartan  (COZAAR ) 25 MG tablet Take 1 tablet (25 mg total) by mouth daily. (Patient not taking: Reported on 12/29/2023) 90 tablet 3   No current facility-administered medications for this encounter.   Allergies  Allergen Reactions   Adriamycin  [Doxorubicin ] Other (See Comments)    Acute heart failure (chemo from Jan - May 2021)   Ppd [Tuberculin Purified Protein Derivative] Swelling    Positive reaction.  Negative Chest Xray 06/16/12   Social History   Socioeconomic History   Marital status: Married    Spouse name: Not on file   Number of children: 1   Years of education: Not on file   Highest education level: Not on file  Occupational History   Occupation: marine scientist  Tobacco Use   Smoking status: Former    Current packs/day: 0.00    Types: Cigarettes    Start date: 01/07/1981    Quit date: 01/08/1996    Years since quitting: 27.9   Smokeless tobacco: Never   Tobacco comments:    quit 1998 but is exposed to 2nd hand smoke  Vaping Use   Vaping status: Never Used  Substance and Sexual Activity   Alcohol use: No   Drug use: No   Sexual activity: Yes    Partners: Male    Birth control/protection: Post-menopausal  Other Topics Concern   Not on file  Social History Narrative   Married, one daughter,    Educ: HS   Occup:  employed as an conservator, museum/gallery.   No tob or alc   Social Drivers of Health   Tobacco Use: Medium Risk (12/29/2023)   Patient History    Smoking Tobacco Use: Former    Smokeless Tobacco Use: Never    Passive Exposure: Not on file  Financial Resource Strain: Low Risk (03/22/2021)   Overall Financial  Resource Strain (CARDIA)    Difficulty of Paying Living Expenses: Not very hard  Food Insecurity: No Food Insecurity (10/31/2023)   Epic    Worried About Programme Researcher, Broadcasting/film/video in the Last Year: Never true    Ran Out of Food in the Last Year: Never true  Transportation Needs: No Transportation Needs (10/31/2023)   Epic    Lack of Transportation (Medical): No    Lack of Transportation (Non-Medical): No  Physical Activity: Not on file  Stress: Not on file  Social Connections: Moderately Integrated (10/28/2023)   Social Connection and Isolation Panel  Frequency of Communication with Friends and Family: More than three times a week    Frequency of Social Gatherings with Friends and Family: Once a week    Attends Religious Services: More than 4 times per year    Active Member of [employer] or Organizations: No    Attends Banker Meetings: Never    Marital Status: Married  Catering Manager Violence: Not At Risk (10/31/2023)   Epic    Fear of Current or Ex-Partner: No    Emotionally Abused: No    Physically Abused: No    Sexually Abused: No  Depression (PHQ2-9): Low Risk (11/05/2023)   Depression (PHQ2-9)    PHQ-2 Score: 3  Alcohol Screen: Not on file  Housing: Unknown (11/18/2023)   [facility] from [facility]   Epic    Unable to Pay for Housing in the Last Year: Not on file    Number of Times Moved in the Last Year: Not on file    At any time in the past 12 months, were you homeless or living in a shelter (including now)?: No  Utilities: Not At Risk (10/31/2023)   Epic    Threatened with loss of utilities: No  Health Literacy: Not on file   Family History  Problem Relation Age of Onset   Heart disease Mother 21       chf   Diabetes Mother    Thyroid  disease Mother    Cancer Mother 13       female/ skin   Hypertension Mother    Liver disease Mother    Hypertension Father    Alcohol abuse Father    Heart disease Father    Breast cancer Sister 36    Diabetes Sister    Hypothyroidism Sister    Hashimoto's thyroiditis Sister    Hyperlipidemia Brother    Hypertension Brother        x 2   Diabetes Brother    Hypothyroidism Brother    Prostate cancer Brother 52   Liver disease Maternal Grandmother    Diabetes Paternal Grandmother    Breast cancer Maternal Aunt    Hyperlipidemia Maternal Aunt    Heart disease Maternal Aunt    Bone cancer Maternal Aunt        metastatic   Breast cancer Niece 8   Colon cancer Neg Hx    Colon polyps Neg Hx    Esophageal cancer Neg Hx    Rectal cancer Neg Hx    Stomach cancer Neg Hx    BP 104/68   Pulse 71   Wt 81.4 kg (179 lb 6.4 oz)   SpO2 98%   BMI 32.81 kg/m   Wt Readings from Last 3 Encounters:  12/29/23 81.4 kg (179 lb 6.4 oz)  12/15/23 80.7 kg (178 lb)  11/27/23 77.3 kg (170 lb 6.4 oz)   General:   No resp difficulty Neck: no JVD.  Cor: Regular rate & rhythm.  Lungs: clear Abdomen: soft, nontender, nondistended.  Extremities: no  edema Neuro: alert & oriented x3  EKG today : SR 75 bpm QT 456 QTc 509 ms personally reviewed.   ASSESSMENT & PLAN: 1. Chronic Systolic HF  - Echo 1/21 EF 55-60%. RV normal - Echo 6/21 EF 20-25%  moderately reduced RV function with severe MR/TR - Cath 06/25/19 no CAD. - Drop in EF felt most likely from R-CHOP - Echo 05/04/20 w/ EF 50-55% G2DD. RV normal - Echo 9/22 at [facility] EF back down to  40% w/ ? LV thrombus - 12/22 [facility] - cMRI 11/22 LVEF 39%, no LV clot - Echo 1/23 [facility]): EF 40-45% - Echo 3/23: EF 20-25%, RV mildly reduced, RVSP 44 mmHg - Direct admit from clinic 03/07/21  Repeat echo, EF 20%.  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 Required milrinone  for low-output.  - Echo 06/29/21: EF improved 35-40% (read as 30-35%) with new severe MR - TEE 07/04/21: EF 20-25% severe MR moderate - Echo 5/24: EF 35%, RV mildly reduced, mild to moderate MR - Echo 6/25: EF 35-40% mod MR  - NYHA II. Appears euvolemic. .  - Continue torsemide  20 mg daily +  20 KCL daily. -Increase spiro 25 mg daily.  Repeat BMET in 7-10 days. - Continue Farxiga  10 mg daily. No GU symptoms. - Continue ivabradine  7.5 mg bid   2. H/o VT + PVCs - Quiescent. No PVCs on EKG.  -  Decrease amiodarone  to 50 mg daily - Amio labs UTD  3. Severe MR  - likely functional - TEE 07/04/21 EF 20-25% severe MR moderate - Echo 6/25 moderate MR   4. Stage IV non-Hodgkin's lymphoma, mantle cell lymphoma   - s/p R-CHOP w/ Neulasta . - Evidence of disease progression on f/u Chest CT 6/22. - s/p CAR-T therapy in 12/22 - Followed at [facility] with watchful waiting   5. Partial Lt Brachial DVT (provoked) - Diagnosed 03/13/21 - Continue Eliquis  5 mg bid. No bleeding issues  6. HL - LDL 155  - Felt bad on Crestor .  - Per PCP   7. QT prolonged QT/QTc ok today off zofran /compazine .   Follow up in 3 months. I spent 30 minutes reviewing records, interviewing/examining patient, and managing orders.      Greig Mosses, NP 12/29/2023  "

## 2024-01-09 ENCOUNTER — Ambulatory Visit (HOSPITAL_COMMUNITY)
Admission: RE | Admit: 2024-01-09 | Discharge: 2024-01-09 | Disposition: A | Discharge status: Home / Self Care | Source: Ambulatory Visit | Attending: Cardiology | Admitting: Cardiology

## 2024-01-09 DIAGNOSIS — I5022 Chronic systolic (congestive) heart failure: Secondary | ICD-10-CM

## 2024-01-22 ENCOUNTER — Inpatient Hospital Stay: Attending: Internal Medicine

## 2024-02-05 ENCOUNTER — Ambulatory Visit: Admitting: Family Medicine

## 2024-02-18 ENCOUNTER — Ambulatory Visit: Admitting: Family Medicine

## 2024-02-23 ENCOUNTER — Ambulatory Visit (HOSPITAL_COMMUNITY): Admitting: Internal Medicine

## 2024-03-04 ENCOUNTER — Inpatient Hospital Stay: Attending: Internal Medicine

## 2024-04-15 ENCOUNTER — Inpatient Hospital Stay: Attending: Internal Medicine

## 2024-05-12 ENCOUNTER — Other Ambulatory Visit (HOSPITAL_COMMUNITY)

## 2024-05-12 ENCOUNTER — Inpatient Hospital Stay: Attending: Internal Medicine

## 2024-05-13 ENCOUNTER — Inpatient Hospital Stay: Attending: Internal Medicine

## 2024-05-20 ENCOUNTER — Inpatient Hospital Stay: Admitting: Internal Medicine
